# Patient Record
Sex: Female | Born: 1955 | Race: White | Hispanic: No | Marital: Married | Smoking: Never smoker
Health system: Southern US, Community
[De-identification: ages and names within clinical notes are randomized; demographics above are authoritative.]

## PROBLEM LIST (undated history)

## (undated) DIAGNOSIS — I10 Essential (primary) hypertension: Secondary | ICD-10-CM

## (undated) DIAGNOSIS — E785 Hyperlipidemia, unspecified: Secondary | ICD-10-CM

## (undated) DIAGNOSIS — I639 Cerebral infarction, unspecified: Secondary | ICD-10-CM

## (undated) DIAGNOSIS — N390 Urinary tract infection, site not specified: Secondary | ICD-10-CM

## (undated) DIAGNOSIS — E119 Type 2 diabetes mellitus without complications: Secondary | ICD-10-CM

## (undated) HISTORY — PX: BACK SURGERY: SHX140

## (undated) HISTORY — DX: Hyperlipidemia, unspecified: E78.5

## (undated) HISTORY — PX: BREAST SURGERY: SHX581

## (undated) HISTORY — DX: Essential (primary) hypertension: I10

## (undated) HISTORY — DX: Type 2 diabetes mellitus without complications: E11.9

---

## 1998-04-02 ENCOUNTER — Emergency Department (HOSPITAL_COMMUNITY): Admission: EM | Admit: 1998-04-02 | Discharge: 1998-04-02 | Payer: Self-pay

## 2000-01-24 ENCOUNTER — Other Ambulatory Visit: Admission: RE | Admit: 2000-01-24 | Discharge: 2000-01-24 | Payer: Self-pay | Admitting: Gynecology

## 2000-03-16 ENCOUNTER — Encounter: Admission: RE | Admit: 2000-03-16 | Discharge: 2000-03-16 | Payer: Self-pay | Admitting: Gynecology

## 2000-03-16 ENCOUNTER — Encounter: Payer: Self-pay | Admitting: Gynecology

## 2001-12-12 ENCOUNTER — Other Ambulatory Visit: Admission: RE | Admit: 2001-12-12 | Discharge: 2001-12-12 | Payer: Self-pay | Admitting: Gynecology

## 2001-12-19 ENCOUNTER — Inpatient Hospital Stay (HOSPITAL_COMMUNITY): Admission: RE | Admit: 2001-12-19 | Discharge: 2001-12-21 | Payer: Self-pay | Admitting: Gynecology

## 2001-12-19 ENCOUNTER — Encounter (INDEPENDENT_AMBULATORY_CARE_PROVIDER_SITE_OTHER): Payer: Self-pay | Admitting: Specialist

## 2003-06-09 ENCOUNTER — Other Ambulatory Visit: Admission: RE | Admit: 2003-06-09 | Discharge: 2003-06-09 | Payer: Self-pay | Admitting: Gynecology

## 2003-06-19 ENCOUNTER — Encounter: Admission: RE | Admit: 2003-06-19 | Discharge: 2003-06-19 | Payer: Self-pay | Admitting: Gynecology

## 2005-09-03 ENCOUNTER — Emergency Department (HOSPITAL_COMMUNITY): Admission: EM | Admit: 2005-09-03 | Discharge: 2005-09-03 | Payer: Self-pay | Admitting: Emergency Medicine

## 2008-02-28 ENCOUNTER — Encounter: Admission: RE | Admit: 2008-02-28 | Discharge: 2008-02-28 | Payer: Self-pay | Admitting: Gynecology

## 2010-10-29 NOTE — H&P (Signed)
[facility]  Patient:    Shelly Mcdaniel, PARE Visit Number: [id number] MRN: [id number]          Service Type: GYN Location: 4W 0467 01 Attending Physician:  Katrina Stack Dictated by:   Gretta Cool, M.D. Admit Date:  12/19/2001 Discharge Date: 12/21/2001   CC:         Hal T. Stoneking, M.D.   History and Physical  CHIEF COMPLAINT: 1. Abnormal uterine bleeding. 2. Pelvic pain.  HISTORY OF PRESENT ILLNESS:  The patient presents after not having been seen here since 2001.  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  She is now having very irregular menses with bleeding lasting as long as five weeks at a time.  She has skips of a few days and then starts bleeding again.  She has noted some hot flushes but denies significant night sweats.  FSH has been measured at low normal.  She also complains of pelvic pain that is cyclic, occurring when her cycles are regular, and identifiable from mid cycle to just prior to menses, and gone entirely the week after menses.  On ultrasound examination she has evidence of endometrial polyp versus fibroid in the cavity with thickening of the cavity.  She has what appears to be multiple leiomyomata versus adenomyosis.  On clinical exam her uterus feels much bigger than it looks.  Suspect she may have bowel and/or ovarian adhesions.  PAST MEDICAL HISTORY: 1. Usual childhood diseases without sequelae. 2. Hypertension, under the primary care of Dr. Pete Glatter, on Accuretic    10/12.5, doing well on that dose. 3. History of acute nephritis in 1963. 4. Hospitalized for an episode of gastroenteritis in 1994.  PAST SURGICAL HISTORY:  Breast biopsy right breast for solid mass, unknown date.  OBSTETRICAL HISTORY:  Vaginal delivery x 2 in 1991 and 1988.  ALLERGIES:  None known.  PRESENT MEDICATIONS:  Accuretic 10/12.5.  FAMILY HISTORY:  Mother had MI and dementia, died age 39.  Father had MI and diabetes type  2.  Mother also had breast cancer.  No other known familial tendency to disease.  SOCIAL HISTORY:  Denies ethanol or tobacco use.  Denies recreational drugs. The patient is very religious, heavily involved in her church.  Her husband works for [employer] as a Adult nurse.  She is a Warden/ranger.  Two children in the home.  REVIEW OF SYSTEMS:  Denies symptoms.  CARDIORESPIRATORY:  Denies asthma, ______ or shortness of breath.  GASTROINTESTINAL/GENITOURINARY:  Denies frequency or urgency, dysuria, change in bowel habits, food intolerance.  PHYSICAL EXAMINATION:  GENERAL:  Well-developed, well-nourished white female with fine facial hair. No mature dark hairs.  She is moderately over her ideal weight.  HEENT:  Pupils equal and reactive to light and accommodation.  Fundi not examined.  Oropharynx clear.  NECK:  Supple.  Without masses or enlargement.  BREASTS:  Without mass, nodes, or nipple discharge.  Previous biopsy site right breast well healed.  ______ areas are negative.  CHEST:  Clear to P&A.  HEART:  Regular rhythm, without murmur or cardiac enlargement.  ABDOMEN:  Soft.  Scaphoid.  Without mass or organomegaly.  PELVIC:  External genitalia normal female.  Vagina clean, rugose.  Cervix is parous, clean.  Uterus anterior, approximately 12 weeks size, irregular. Difficult to separate the adnexa from the uterus.  Nodularity suggests either adhesion to the posterior aspect of the uterus or leiomyomata, not confirmed by ultrasound.  Adnexa difficult to separate from  the uterus.  RECTOVAGINAL:  Confirms.  EXTREMITIES:  Negative.  NEUROLOGIC:  Physiologic.  IMPRESSION: 1. Abnormal uterine bleeding with ultrasound documentation of endometrial    polyp versus fibroid intruding in the cavity. 2. Cyclic pelvic pain, highly suspicious of endometriosis, with strong family    history of endometriosis. 3. Strong family history of cardiovascular  disease and diabetes and dementia.  RECOMMENDATIONS:  Exploratory laparotomy, probable total abdominal hysterectomy, bilateral salpingo-oophorectomy.  Of note, the patients choice is bilateral salpingo-oophorectomy.  I have discussed the risk/benefit ratio, alternative therapies, all options, pain management, recovery, etc.  She is now admitted for definitive therapy. Dictated by:   Gretta Cool, M.D. Attending Physician:  Katrina Stack DD:  12/26/01 TD:  12/28/01 Job: [id number]

## 2010-10-29 NOTE — Discharge Summary (Signed)
NAMEMERRILY, TEGELER                           ACCOUNT NO.:  [id number]   MEDICAL RECORD NO.:  [id number]                   PATIENT TYPE:  INP   LOCATION:  0467                                 FACILITY:  [facility]   PHYSICIAN:  Gretta Cool, M.D.              DATE OF BIRTH:  01-13-1956   DATE OF ADMISSION:  12/19/2001  DATE OF DISCHARGE:  12/21/2001                                 DISCHARGE SUMMARY   HISTORY OF PRESENT ILLNESS:  The patient is a 55 year old, white married  female, G2, P2 who has been a patient in the office for a long time.  Her  last exam was in 2001.  She had known uterine leiomyomata at that time and  is now having irregular periods with bleeding lasting as long as five weeks.  She is only free of menstrual flow for several days and then starts bleeding  again.  She has had some hot flashes, but denies night sweats.  FSH measures  low normal.  She complains of pelvic pain with cycle when her periods are  regular from mid cycle to just prior to menses and entirely gone after the  menstrual cycle is completed.  Ultrasound evaluation reveals endometrial  polyp versus fibroid in the cavity with thickening of the lining.  She has  what appears to be multiple leiomyomata versus adenomyosis.  On clinical  exam, the uterus felt bigger than it looked.  We suspect she perhaps has  some bowel and/or ovarian adhesions.   PHYSICAL EXAMINATION:  CHEST:  Clear to A&P.  HEART:  Regular rate and rhythm without murmur, gallop or cardiac  enlargement.  ABDOMEN:  Soft and scaphoid without masses or organomegaly.  PELVIC:  External genitalia within normal limits for female.  Vagina rugous.  Cervix is clean.  Uterus is anterior approximately 12 weeks in size and  regular in shape.  It is difficult to separate the adnexa from the uterus.  Nodularity suggesting adhesions to the posterior aspect with early  leiomyomata.  Adnexa difficult to evaluate.   IMPRESSION:  1. Abnormal  uterine bleeding with ultrasound documentation of endometrial     polyp versus fibroid intruding the cavity.  2. Cyclic pelvic pain, highly suspicious for endometriosis with strong     family history.  3. Strong family history of cardiovascular disease, diabetes and dementia.   RECOMMENDATIONS:  1. Exploratory laparotomy, probable total abdominal hysterectomy, bilateral     salpingo-oophorectomy.  2. It is noted that the patient's choice is bilateral salpingo-oophorectomy.     Risks and benefits of this procedure as well as options were discussed     with the patient.  She is now admitted for definitive therapy.   LABORATORY DATA AND X-RAY FINDINGS:  Admission hemoglobin 13.4, hematocrit  38.9.  On postop day #1, hemoglobin 11.8, hematocrit 36.2.   HOSPITAL COURSE:  The patient underwent total abdominal hysterectomy,  bilateral  salpingo-oophorectomy, lysis of severe adhesions of the ovaries,  rectosigmoid from the posterior aspect of the uterus under general  anesthesia.  The procedures were completed without any complications and the  patient was returned to the recovery room in excellent condition.  Pathology  revealed slight cervicitis with proliferative endometrium and benign  endometrial polyp.  No hyperplasia or malignancy identified.  Benign  hemorrhagic corpus luteum and endosalpingosis with no endometriosis or  malignancy identified.  Right and left fallopian tubes with benign care of  cysts, no evidence of malignancy in colon or sigmoid, however, endometriosis  was present.   Her postoperative course was without complications and she was discharged on  postop day #2 in excellent condition.   ACTIVITY:  No heavy lifting or straining.  No vaginal entrance.  Increase  ambulation as tolerated.   SPECIAL INSTRUCTIONS:  She is to call for any fever over 100.1 or failure of  daily improvement.   DIET:  Regular.   DISCHARGE MEDICATIONS:  1. Tylox one p.o. q.4h. p.r.n.  discomfort.  2. Vioxx 25 mg daily.   FOLLOW UP:  She is to return to our office in one week for followup.   CONDITION ON DISCHARGE:  Excellent.   DISCHARGE DIAGNOSES:  1. Endometrial polyp.  2. 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  3. Severe ovarian, uterine and rectosigmoid adhesions.  4. Abnormal uterine bleeding with polyp versus fibroid.  5. Cyclic pelvic pain.  6. Uterine enlargement.   PROCEDURES:  Total abdominal hysterectomy, bilateral salpingo-oophorectomy,  lysis of severe adhesions of the ovaries and rectosigmoid from the posterior  aspect of the uterus.      Matt Holmes, N.P.                          Gretta Cool, M.D.    EMK/MEDQ  D:  01/14/2002  T:  01/19/2002  Job:  [id number]   cc:   Hal T. Stoneking, M.D.

## 2010-10-29 NOTE — Op Note (Signed)
[facility]  Patient:    Shelly Mcdaniel, Shelly Mcdaniel Visit Number: [id number] MRN: [id number]          Service Type: GYN Location: 4W 0467 01 Attending Physician:  Katrina Stack Dictated by:   Gretta Cool, M.D. Proc. Date: 12/19/01 Admit Date:  12/19/2001   CC:         Hal T. Stoneking, M.D.   Operative Report  PREOPERATIVE DIAGNOSES: 1. Abnormal uterine bleeding with luminal polyp versus fibroid. 2. Cyclic pelvic pain, adenomyosis versus endometriosis. 3. Uterine enlargement.  POSTOPERATIVE DIAGNOSIS:  Endometrial polyp, probable adenomyosis with extensive endometriosis and severe ovarian, uterine, and rectosigmoid adhesions.  SURGEON:  Gretta Cool, M.D.  ASSISTANT:  Raynald Kemp, M.D.  PROCEDURE:  Total abdominal hysterectomy, bilateral salpingo-oophorectomy, lysis of severe adhesions of ovaries, rectosigmoid from the posterior aspect of the uterus.  ANESTHESIA:  General orotracheal.  DESCRIPTION OF PROCEDURE:  Under excellent general anesthesia with the patients abdomen prepped and draped as a sterile field and a Foley catheter draining the bladder, a Pfannenstiel incision was made and the excision extended through the fascia.  The peritoneum was then opened and the abdomen explored.  The gallbladder was examined carefully, and I did not detect any evidence of sludge or stones in her gallbladder.  It was quite flaccid, without evidence of granules in it.  The remainder of the upper abdominal examination was unremarkable.  The appendix was not removed.  Examination of the pelvis revealed extensive endometriosis with severe dense adhesions of the uterus to the rectosigmoid, and both ovaries and tubes were densely adherent to the posterior aspect of the uterus.  These adhesions were lysed by blunt and sharp dissection until the uterus was finally freed from the tubes and ovaries and from the rectosigmoid.  The dissection was carried all  the way to the rectovaginal septum, largely by sharp dissection to prevent inadvertent injury to the colon.  There was no evidence of residual ovarian tissue or significant endometriosis left on the colon.  At this point the uterus was elevated up out of the pelvis.  The bleeding from all the adhesions was controlled by monopolar cautery.  The round ligament was then transected on each side anteriorly.  The anterior leaf of the broad ligament was then opened and the bladder pushed off the lower segment.  The infundibulopelvic vessels were then skeletonized, isolated from the ureter, clamped, cut, sutured, and tied with 0-Vicryl, and a second free tie was used to doubly ligate the infundibulopelvic.  The uterine vessels were then skeletonized, clamped, cut, sutured, and tied with 0-Vicryl.  The cardinal and uterosacral ligaments were then likewise clamped, cut, sutured, and tied with 0-Vicryl.  At this point the cervix was excised and the cuff closed with a running suture of 0-Vicryl. A suture of 0-Ethibond was then used to provide cardinal uterosacral colposuspension so as to prevent vaginal cuff descensus.  The pedicles were then all examined carefully.  There was no bleeding.  The pelvic floor was then reperitonealized with running suture of 2-0 Monocryl.  At this point the packs and retractors were removed.  The pelvis was irrigated with lactated Ringers.  The abdominal peritoneum was then closed with running suture of 0-Monocryl from the apex to the symphysis.  The rectus muscles were then plicated in the midline with a running suture of 0-Monocryl.  The fascia was then approximated from each angle to the midline with a running suture of 0-Vicryl.  The subcutaneous was approximated  ______ with a 3-0 Vicryl, and the skin was closed with skin staples and Steri-Strips.  At the end of the procedure sponge and lap counts were correct, with no complications.  The patient returned to the  recovery room in excellent condition. Dictated by:   Gretta Cool, M.D. Attending Physician:  Katrina Stack DD:  12/19/01 TD:  12/22/01 Job: [id number]

## 2010-11-03 ENCOUNTER — Other Ambulatory Visit: Payer: Self-pay | Admitting: Geriatric Medicine

## 2010-11-03 DIAGNOSIS — K829 Disease of gallbladder, unspecified: Secondary | ICD-10-CM

## 2010-11-03 DIAGNOSIS — R112 Nausea with vomiting, unspecified: Secondary | ICD-10-CM

## 2010-11-04 ENCOUNTER — Ambulatory Visit
Admission: RE | Admit: 2010-11-04 | Discharge: 2010-11-04 | Disposition: A | Discharge status: Home / Self Care | Payer: BC Managed Care – PPO | Source: Ambulatory Visit | Attending: Geriatric Medicine | Admitting: Geriatric Medicine

## 2010-11-04 DIAGNOSIS — K829 Disease of gallbladder, unspecified: Secondary | ICD-10-CM

## 2010-11-04 DIAGNOSIS — R112 Nausea with vomiting, unspecified: Secondary | ICD-10-CM

## 2010-12-31 ENCOUNTER — Other Ambulatory Visit (HOSPITAL_COMMUNITY): Payer: Worker's Compensation

## 2011-01-03 ENCOUNTER — Encounter (HOSPITAL_COMMUNITY)
Admission: RE | Admit: 2011-01-03 | Discharge: 2011-01-03 | Disposition: A | Discharge status: Home / Self Care | Payer: Worker's Compensation | Source: Ambulatory Visit | Attending: Neurological Surgery | Admitting: Neurological Surgery

## 2011-01-03 ENCOUNTER — Other Ambulatory Visit (HOSPITAL_COMMUNITY): Payer: Self-pay | Admitting: Neurological Surgery

## 2011-01-03 ENCOUNTER — Ambulatory Visit (HOSPITAL_COMMUNITY)
Admission: RE | Admit: 2011-01-03 | Discharge: 2011-01-03 | Disposition: A | Discharge status: Home / Self Care | Payer: Worker's Compensation | Source: Ambulatory Visit | Attending: Neurological Surgery | Admitting: Neurological Surgery

## 2011-01-03 DIAGNOSIS — Z01812 Encounter for preprocedural laboratory examination: Secondary | ICD-10-CM | POA: Insufficient documentation

## 2011-01-03 DIAGNOSIS — M4722 Other spondylosis with radiculopathy, cervical region: Secondary | ICD-10-CM

## 2011-01-03 DIAGNOSIS — Z01818 Encounter for other preprocedural examination: Secondary | ICD-10-CM | POA: Insufficient documentation

## 2011-01-03 DIAGNOSIS — M47812 Spondylosis without myelopathy or radiculopathy, cervical region: Secondary | ICD-10-CM | POA: Insufficient documentation

## 2011-01-03 LAB — CBC
MCH: 31.4 pg (ref 26.0–34.0)
MCHC: 36.7 g/dL — ABNORMAL HIGH (ref 30.0–36.0)
Platelets: 227 10*3/uL (ref 150–400)
RDW: 12.6 % (ref 11.5–15.5)

## 2011-01-03 LAB — SURGICAL PCR SCREEN: Staphylococcus aureus: NEGATIVE

## 2011-01-03 LAB — BASIC METABOLIC PANEL
Calcium: 9.7 mg/dL (ref 8.4–10.5)
GFR calc Af Amer: >60 mL/min (ref >60)
GFR calc non Af Amer: >60 mL/min (ref >60)
Potassium: 3.7 mEq/L (ref 3.5–5.1)
Sodium: 138 mEq/L (ref 135–145)

## 2011-01-06 ENCOUNTER — Inpatient Hospital Stay (HOSPITAL_COMMUNITY)
Admission: RE | Admit: 2011-01-06 | Discharge: 2011-01-11 | DRG: 471 | Disposition: A | Discharge status: Home / Self Care | Payer: Worker's Compensation | Source: Ambulatory Visit | Attending: Neurological Surgery | Admitting: Neurological Surgery

## 2011-01-06 DIAGNOSIS — I10 Essential (primary) hypertension: Secondary | ICD-10-CM | POA: Diagnosis present

## 2011-01-06 DIAGNOSIS — J384 Edema of larynx: Secondary | ICD-10-CM | POA: Diagnosis not present

## 2011-01-06 DIAGNOSIS — E1101 Type 2 diabetes mellitus with hyperosmolarity with coma: Secondary | ICD-10-CM | POA: Diagnosis present

## 2011-01-06 DIAGNOSIS — Z794 Long term (current) use of insulin: Secondary | ICD-10-CM

## 2011-01-06 DIAGNOSIS — F411 Generalized anxiety disorder: Secondary | ICD-10-CM | POA: Diagnosis present

## 2011-01-06 DIAGNOSIS — M47812 Spondylosis without myelopathy or radiculopathy, cervical region: Principal | ICD-10-CM | POA: Diagnosis present

## 2011-01-06 DIAGNOSIS — E860 Dehydration: Secondary | ICD-10-CM | POA: Diagnosis present

## 2011-01-06 LAB — GLUCOSE, CAPILLARY
Glucose-Capillary: 374 mg/dL — ABNORMAL HIGH (ref 70–99)
Glucose-Capillary: 302 mg/dL — ABNORMAL HIGH (ref 70–99)
Glucose-Capillary: 268 mg/dL — ABNORMAL HIGH (ref 70–99)
Glucose-Capillary: 159 mg/dL — ABNORMAL HIGH (ref 70–99)

## 2011-01-06 LAB — HEMOGLOBIN A1C: Mean Plasma Glucose: 266 mg/dL — ABNORMAL HIGH (ref <117)

## 2011-01-06 NOTE — Consult Note (Signed)
Shelly Mcdaniel, TREICHLER NO.:  [id number]  MEDICAL RECORD NO.:  [id number]  LOCATION:  2899                         FACILITY:  [facility]  PHYSICIAN:  Talmage Nap, MD  DATE OF BIRTH:  [birth year]  DATE OF CONSULTATION: 01/06/2011 DATE OF DISCHARGE:                                CONSULTATION   REFERRING PHYSICIAN:  Stefani Dama, MD  REASON FOR CONSULTATION:  Management of uncontrolled blood sugar level.  HISTORY OF PRESENT ILLNESS:  The patient is a 55 year old Caucasian female with history of hypertension and diabetes mellitus, who was admitted by the neurosurgeon, Dr. Barnett Abu for cervical spondylitis and scheduled for anterior cervical decompression/diskectomy surgery. Surgery was, however, postponed because the patient was found to have markedly elevated blood sugar level.  Her initial blood sugar level was said to be over 500 mg percent.  According to the patient for the past 3 days, she has been having generalized abdominal discomfort with associated nausea and vomiting.  She denied any history of diarrhea. She denied any fever.  She denied any chills.  She denied any rigor. She claims she had multiple episodes of vomiting that was non-projectile and at the same time she had very poor appetite.  The patient said she took a bottle of soda prior to coming to the hospital.  She, however, denied any history of chest pain or shortness of breath.  She denied any polyuria or polydipsia.  She denied any urinary symptoms and subsequently presented to the hospital for her surgery.  An immediate blood sugar level done showed blood sugar level to be more than 500 mg percent.  Surgery was, however, postponed and I was consulted to evaluate the patient and to control the blood sugar level.  PAST MEDICAL HISTORY:  Positive for, 1. Diabetes mellitus. 2. Hypertension. 3. Questionable anxiety state.  PAST SURGICAL HISTORY:  Lumpectomy and total abdominal  hysterectomy.  MEDICATIONS:  Her preadmission meds include, 1. Aspirin 81 mg p.o. daily. 2. Fish oil 1000 mg over-the-counter one capsule p.o. daily. 3. Vitamin D3 1000 units over-the-counter one p.o. daily at bedtime. 4. Sertraline 100 mg half a tablet p.o. daily. 5. Quinapril/hydrochlorothiazide 20/25 one p.o. daily at bedtime. 6. Metformin 1000 mg one p.o. daily. 7. Glipizide 5 mg p.o. b.i.d. 8. Phenergan 25 mg p.o. daily.  ALLERGIES:  TRIMETHOPRIM/SULFAMETHOXAZOLE.  SOCIAL HISTORY:  Negative for alcohol or tobacco use.  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  FAMILY HISTORY:  York Spaniel to be positive for diabetes mellitus.  REVIEW OF SYSTEMS:  The patient denies any history of headaches.  No blurry vision.  Complaint of excessive thirst.  Denies any chest pain or shortness of breath.  No cough.  No PND or orthopnea.  Presently denies any abdominal discomfort.  No diarrhea or hematochezia.  No dysuria or hematuria.  No swelling of the lower extremities.  No intolerance to heat or cold and no neuropsychiatric disorder.  PHYSICAL EXAMINATION:  GENERAL:  Very pleasant middle-aged lady, dehydrated, but not in any respiratory distress at present. VITAL SIGNS:  Blood pressure is 115/78, heart rate is 88, respiratory rate is 10, saturating 95% on room air. HEENT:  Mild  pallor.  Pupils are reactive to light and extraocular muscles are intact. NECK:  No jugular venous distention.  No carotid bruits.  No lymphadenopathy. CHEST:  Clear to auscultation. HEART:  Sounds are 1 and 2. ABDOMEN:  Soft and nontender.  Liver, spleen, and kidney not palpable. Bowel sounds are positive. EXTREMITIES:  No pedal edema. NEUROLOGIC:  Nonfocal. MUSCULOSKELETAL SYSTEM:  Unremarkable. NEUROPSYCHIATRIC:  Unremarkable. SKIN:  Showed decreased turgor.  LABORATORY DATA:  Basic metabolic panel done by the patient on January 03, 2011, shows sodium of 138, potassium of 3.7, chloride of 90 with  a bicarb of 28, glucose is 231.  BUN is 19, creatinine 0.62.  Complete blood count with no differential showed WBC of 7.6, hemoglobin of 15.3, hematocrit of 41.7, MCV of 85.5 with a platelet count of 227,000.  IMPRESSION: 1. Cervical spondylitis, scheduled for anterior cervical     decompression/diskectomy surgery on 01/07/2011 2. Nonketotic hyperosmolar hyperglycemia. 3. Dehydration. 4. Hypertension. 5. Anxiety state.  PLAN:  To admit the patient to 3000 units as per the neurosurgeon.  The patient is being vigorously rehydrated with normal saline IV to go at rate of 250 mL an hour.  She will be on Accu-Cheks q. Hourly.  When blood sugar level is lesser equal to 250 mg percent, the patient will be given Lantus insulin 10 units subcu stat and she will be on Accu-Cheks t.i.d. with a.c. and h.s. with regular insulin sliding scale (moderate scale).  The patient will also be given Lantus 10 units subcu at bedtime and she will be on Zofran 4 mg IV q.4 p.r.n. for nausea.  She will start diabetic ADA diet.  She will also be on sertraline 50 mg p.o. daily. Since the patient is scheduled to have surgery in the morning, she will be made n.p.o. at midnight.  Further workup to be done on this patient will include hemoglobin A1c, CBC, CMP and magnesium will be repeated in a.m.  The patient will be followed and evaluated on a daily basis.  Thank you for allowing me to participate in the management of this patient.     Talmage Nap, MD     CN/MEDQ  D:  01/06/2011  T:  01/06/2011  Job:  [id number]  Electronically Signed by Talmage Nap  on 01/06/2011 05:51:04 PM

## 2011-01-07 ENCOUNTER — Inpatient Hospital Stay (HOSPITAL_COMMUNITY)
Admission: RE | Admit: 2011-01-07 | Payer: Worker's Compensation | Source: Ambulatory Visit | Admitting: Neurological Surgery

## 2011-01-07 ENCOUNTER — Inpatient Hospital Stay (HOSPITAL_COMMUNITY): Payer: Worker's Compensation

## 2011-01-07 LAB — DIFFERENTIAL
Eosinophils Absolute: 0.1 10*3/uL (ref 0.0–0.7)
Eosinophils Relative: 1 % (ref 0–5)
Lymphocytes Relative: 45 % (ref 12–46)
Lymphs Abs: 2.2 10*3/uL (ref 0.7–4.0)
Monocytes Relative: 5 % (ref 3–12)
Neutrophils Relative %: 48 % (ref 43–77)

## 2011-01-07 LAB — GLUCOSE, CAPILLARY
Glucose-Capillary: 221 mg/dL — ABNORMAL HIGH (ref 70–99)
Glucose-Capillary: 214 mg/dL — ABNORMAL HIGH (ref 70–99)
Glucose-Capillary: 247 mg/dL — ABNORMAL HIGH (ref 70–99)

## 2011-01-07 LAB — COMPREHENSIVE METABOLIC PANEL
BUN: 15 mg/dL (ref 6–23)
CO2: 28 mEq/L (ref 19–32)
Calcium: 8.6 mg/dL (ref 8.4–10.5)
Chloride: 107 mEq/L (ref 96–112)
Creatinine, Ser: 0.52 mg/dL (ref 0.50–1.10)
GFR calc Af Amer: >60 mL/min (ref >60)
GFR calc non Af Amer: >60 mL/min (ref >60)
Total Bilirubin: 0.3 mg/dL (ref 0.3–1.2)

## 2011-01-07 LAB — CBC
HCT: 34.5 % — ABNORMAL LOW (ref 36.0–46.0)
MCH: 29.6 pg (ref 26.0–34.0)
MCV: 84.4 fL (ref 78.0–100.0)
RBC: 4.09 MIL/uL (ref 3.87–5.11)
WBC: 4.9 10*3/uL (ref 4.0–10.5)

## 2011-01-07 LAB — MAGNESIUM: Magnesium: 1.7 mg/dL (ref 1.5–2.5)

## 2011-01-08 LAB — GLUCOSE, CAPILLARY
Glucose-Capillary: 264 mg/dL — ABNORMAL HIGH (ref 70–99)
Glucose-Capillary: 143 mg/dL — ABNORMAL HIGH (ref 70–99)

## 2011-01-08 LAB — CBC
Hemoglobin: 12.6 g/dL (ref 12.0–15.0)
MCHC: 35.8 g/dL (ref 30.0–36.0)
WBC: 8.4 10*3/uL (ref 4.0–10.5)

## 2011-01-08 LAB — COMPREHENSIVE METABOLIC PANEL
Albumin: 3.2 g/dL — ABNORMAL LOW (ref 3.5–5.2)
Alkaline Phosphatase: 80 U/L (ref 39–117)
BUN: 13 mg/dL (ref 6–23)
Calcium: 9.2 mg/dL (ref 8.4–10.5)
Creatinine, Ser: 0.58 mg/dL (ref 0.50–1.10)
GFR calc Af Amer: >60 mL/min (ref >60)
Glucose, Bld: 386 mg/dL — ABNORMAL HIGH (ref 70–99)
Potassium: 4.3 mEq/L (ref 3.5–5.1)
Total Protein: 6.4 g/dL (ref 6.0–8.3)

## 2011-01-08 LAB — DIFFERENTIAL
Basophils Absolute: 0 10*3/uL (ref 0.0–0.1)
Basophils Relative: 0 % (ref 0–1)
Lymphocytes Relative: 8 % — ABNORMAL LOW (ref 12–46)
Monocytes Absolute: 0.3 10*3/uL (ref 0.1–1.0)
Neutro Abs: 7.4 10*3/uL (ref 1.7–7.7)
Neutrophils Relative %: 88 % — ABNORMAL HIGH (ref 43–77)

## 2011-01-08 LAB — MAGNESIUM: Magnesium: 2 mg/dL (ref 1.5–2.5)

## 2011-01-09 ENCOUNTER — Inpatient Hospital Stay (HOSPITAL_COMMUNITY): Payer: Worker's Compensation

## 2011-01-09 LAB — GLUCOSE, CAPILLARY
Glucose-Capillary: 277 mg/dL — ABNORMAL HIGH (ref 70–99)
Glucose-Capillary: 283 mg/dL — ABNORMAL HIGH (ref 70–99)
Glucose-Capillary: 315 mg/dL — ABNORMAL HIGH (ref 70–99)

## 2011-01-10 LAB — GLUCOSE, CAPILLARY
Glucose-Capillary: 276 mg/dL — ABNORMAL HIGH (ref 70–99)
Glucose-Capillary: 258 mg/dL — ABNORMAL HIGH (ref 70–99)
Glucose-Capillary: 256 mg/dL — ABNORMAL HIGH (ref 70–99)
Glucose-Capillary: 248 mg/dL — ABNORMAL HIGH (ref 70–99)

## 2011-01-11 LAB — POCT I-STAT GLUCOSE: Glucose, Bld: 133 mg/dL — ABNORMAL HIGH (ref 70–99)

## 2011-01-11 LAB — GLUCOSE, CAPILLARY
Glucose-Capillary: 177 mg/dL — ABNORMAL HIGH (ref 70–99)
Glucose-Capillary: 213 mg/dL — ABNORMAL HIGH (ref 70–99)

## 2011-01-12 LAB — GLUCOSE, CAPILLARY

## 2011-01-17 NOTE — Discharge Summary (Signed)
  [name], [name] NO.:  [id number]  MEDICAL RECORD NO.:  [id number]  LOCATION:  [place]                         FACILITY:  [facility]  PHYSICIAN:  Stefani Dama, M.D.  DATE OF BIRTH:  08-21-1955  DATE OF ADMISSION:  01/06/2011 DATE OF DISCHARGE:  01/11/2011                              DISCHARGE SUMMARY   ADMITTING DIAGNOSES: 1. Cervical spondylosis with radiculopathy and myelopathy. 2. Diabetes mellitus with poor diabetic control. 3. Postoperative laryngeal edema, requiring steroid medication. 4. Exacerbation of diabetes secondary to steroids.  FINAL DIAGNOSES: 1. Cervical spondylosis with radiculopathy and myelopathy. 2. Diabetes mellitus with poor diabetic control. 3. Postoperative laryngeal edema, requiring steroid medication. 4. Exacerbation of diabetes secondary to steroids.  CONDITION ON DISCHARGE:  Improving.  HOSPITAL COURSE:  Shelly Mcdaniel is a 55 year old individual who was electively worked up for cervical spondylitic disease and noting that she had some early myelopathic cord compression and had some symptoms of myelopathy in addition to radiculopathy.  She was advised regarding the need for 3-level anterior decompression arthrodesis.  This was planned for the 26th, however, when the patient presented to the outpatient admitting area, her blood sugar was noted be 514.  Her surgery was cancelled for that day and the patient was admitted to undergo some control of her diabetic condition.  She was given some exogenous insulin, and by the following day her sugars were in the 130-150 range, and she underwent a surgical decompression as planned.  First evening postoperatively the patient was stable.  However, the following day, that is the 28th, the patient developed some significant laryngeal edema.  Her voice was very hoarse.  She had some swelling in the soft tissues of her neck, but no evidence of gross hematoma.  She was started on some high-dose  Decadron which promptly improved her condition; however, soon her sugars started to rise.  She was seen by the Hospitalist in consultation for diabetic control and again started on exogenous insulin.  Her steroids were rapidly tapered once her edema picture improved and at the current time she is being discharged home on Decadron 2 mg one twice a day for 1 day to be tapered to 1 mg a day for the next several days.  She has also been instructed as to the use of insulin for the next several days until her sugars come under control and she can continue on her metformin and other oral agents.  At the time of discharge, she is given a prescription for Percocet #40 without refills, Valium 5 mg #30 without refills.  She will be continued on her glipizide and metformin which she was on as an outpatient.  She has been instructed to use of Humulin N and will continue this until her blood sugars do not require further stabilization.  She will be seen in the office in approximately 3 weeks' time and her followup will be with the primary care physician regarding care for diabetes.     Stefani Dama, M.D.     [name]  D:  01/11/2011  T:  01/12/2011  Job:  [id number]  Electronically Signed by Barnett Abu M.D. on 01/17/2011 07:35:36 AM

## 2011-01-17 NOTE — Op Note (Signed)
NAMESERAYAH, YAZDANI NO.:  [id number]  MEDICAL RECORD NO.:  [id number]  LOCATION:  [place]                         FACILITY:  [facility]  PHYSICIAN:  Stefani Dama, M.D.  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  PROCEDURE:  Anterior cervical decompression at C4-5, C5-6, and C6-7, arthrodesis with structural allograft and Alphatec plate fixation at C4- C7.  SURGEON:  Stefani Dama, MD  FIRST ASSISTANT:  Coletta Memos, MD  ANESTHESIA:  General endotracheal intubation.  Aleana is a 55 year old individual who has had significant problems with neck, shoulder and arm pain for a prolonged period of time refractory to conservative measures.  She has advanced spondylitic disease at C4-5, C5- 6 and C6-7 with bilateral foraminal stenoses at the most severe at C5-6, moderately severe at C4-5 and moderately severe at C6-7.  She also had some mild spondylitic changes at C3-4 and it was felt that relief of the worst of her radicular symptoms should need decompression at the three levels noted.  PROCEDURE:  The patient was brought to the operating room supine on the stretcher.  After smooth induction of general endotracheal anesthesia, she was placed in 5 pins of halter traction.  Neck was prepped with alcohol and DuraPrep and draped in a sterile fashion.  Transverse incision was made in the left side of the neck and this was carried down through the platysma.  Plane between the sternocleidomastoid and strap muscles were dissected bluntly until the prevertebral space was reached. The first identifiable disk space was noted to be that of C4-5 on localizing radiograph and dissection was nicely centered over the  area of C5-C6.  Longus coli muscle was then stripped off of either side of midline and self-retaining retractor was placed under the muscle.  The first disk space that was exposed was C5-C6.  A 15-blade was used to open the anterior longitudinal ligament.  There was moderate amount of the ventral osteophytosis.  This was taken down with 2-mm Kerrison punch and the disk space was entered using combination of Kerrison rongeurs to evacuate a significant quantity of severely degenerated and desiccated disk material.  Dissection continued, there was noted to be substantial osteophytic spurs on either side of C5-6.  These were drilled down with high-speed drill.  A 2-mm Kerrison punch was then used to undercut the ligamentum and decompress the lateral aspects near the nerve roots.  As the nerve roots were decompressed, the uncinate spurs were taken down further and the central canal was also decompressed removing the entirety of the posterior longitudinal ligament.  Once was area was well relieved, hemostasis in the soft tissues was obtained with some bipolar cautery and some small pledgets of Gelfoam and a 6-mm transgraft was shaved to the appropriate size and configuration.  Endplates were smoothed with the same barrel bit and then the bone graft was filled with demineralized bone matrix and placed into the  interspace.  Tension was then turned to C4-5 where similar procedure was carried out.  The uncinate spurs were not quite osteophytic; however, there were drilled down fully.  Lateral recesses were cleared and hemostasis was similarly achieved.  Again, a 6-mm transgraft was shaved with appropriate configuration, filled with demineralized bone matrix and placed into the interspace.  It was countersunk appropriately at C6-C7.  Uncinate spurs were moderate in size.  These were drilled down and completely removed. C7 nerve roots were well decompressed.  The posterior longitudinal ligament was  removed completely both centrally and laterally, and hemostasis was achieved in a similar fashion.  Endplates were smoothed as they were in the other two levels and a 6-mm transgraft was placed into the interspace, filled with demineralized bone matrix.  Once the decompression was completed, traction was removed, then the 51-mm standard size Alphatec plate of the newest variety was placed into the interspace and secured with eight locking 4 x 14-mm screws.  The final localizing radiograph identified good position of the construct. Hemostasis in the soft tissues in the paraspinous musculature was then meticulously obtained.  Remainder of the demineralized bone matrix was packed into the lateral gutters to facilitate arthrodesis and once this was accomplished, then the wound was copiously irrigated with antibiotic irrigating fluid several times and when good hemostasis was established, the platysma was closed with 3-0 Vicryl in interrupted fashion, 3-0 Vicryl using subcuticular tissues and Dermabond was placed on the skin. The patient was then returned to recovery room in stable condition. Blood loss was estimated little over 100 mL.     Stefani Dama, M.D.     [name]  D:  01/07/2011  T:  01/08/2011  Job:  [id number]  Electronically Signed by Barnett Abu M.D. on 01/17/2011 07:35:30 AM

## 2011-02-17 NOTE — Discharge Summary (Signed)
  Shelly Mcdaniel, Shelly Mcdaniel NO.:  [id number]  MEDICAL RECORD NO.:  [id number]  LOCATION:  [place]                         FACILITY:  [facility]  PHYSICIAN:  Stefani Dama, M.D.  DATE OF BIRTH:  Dec 13, 1955  DATE OF ADMISSION:  01/06/2011 DATE OF DISCHARGE:  01/11/2011                              DISCHARGE SUMMARY   ADMITTING DIAGNOSES:  Cervical spondylosis and spinal stenosis.  SECONDARY DIAGNOSES:  Diabetes mellitus.  BRIEF HISTORY AND HOSPITAL COURSE:  The patient is a 55 year old female with diabetes and is being presented for elective cervical spinal decompression and fusion.  She came to the hospital and her blood sugar was 513.  Surgery was cancelled and rescheduled for the next day. Hospitalists were consulted for glucose control.  The patient has also had some nausea and vomiting secondary to anxiety.  Hospitalist Team was consulted to establish blood sugar control and surgery is planned for the next day.  She was found to have hyperglycemia, dehydration, hypertension, and anxiety.  All of these conditions were treated by the Hospitalist Team.  She was ready for surgery the next day.  ACDF at C4- 5, C5-6, C6-7 was planned.  Her CBGs were well controlled the next morning on January 07, 2011.  She was n.p.o.  She tolerated the procedure well under general endotracheal anesthesia, stabilized in recovery room, placed back on the floor.  Vital signs were stable.  Started with physical therapy, occupational therapy, continued glycemic control. Recommendation was to follow up with her primary care doctor a week after discharge and she was ready for discharge home on January 11, 2011. Eating well, voiding well, neurologically intact, afebrile.  Wound benign, no erythema, drainage, or signs of infection.  Ambulating safely following neck precautions.  DISCHARGE CONDITION:  Stable, improved.  DISCHARGE INSTRUCTIONS:  Discharged home.  She was discharged on, 1. Enteric  coated aspirin 81 mg p.o. daily. 2. Fish oil 1000 mg p.o. daily. 3. Vitamin D3 1000 units p.o. at bedtime. 4. Sertraline 100 mg one-half tablet p.o. daily. 5. Quinapril/hydrochlorothiazide 20/25 one p.o. at bedtime. 6. Metformin 1000 mg 1 p.o. b.i.d. 7. Phenergan 25 mg 1 tablet p.r.n. nausea q.6 h. 8. Glipizide 5 mg one p.o. b.i.d.  Contact our office prior to followup if she has any question or concerns.  Instructed on wound care.     Shelly Mcdaniel.   ______________________________ Stefani Dama, M.D.    SCI/MEDQ  D:  02/10/2011  T:  02/10/2011  Job:  [id number]  Electronically Signed by Velora Heckler. on 02/15/2011 09:38:30 AM Electronically Signed by Barnett Abu M.D. on 02/17/2011 03:19:52 PM

## 2011-03-01 ENCOUNTER — Other Ambulatory Visit: Payer: Self-pay | Admitting: Gynecology

## 2011-03-01 DIAGNOSIS — Z1231 Encounter for screening mammogram for malignant neoplasm of breast: Secondary | ICD-10-CM

## 2011-03-09 ENCOUNTER — Ambulatory Visit
Admission: RE | Admit: 2011-03-09 | Discharge: 2011-03-09 | Disposition: A | Discharge status: Home / Self Care | Payer: Worker's Compensation | Source: Ambulatory Visit | Attending: Gynecology | Admitting: Gynecology

## 2011-03-09 DIAGNOSIS — Z1231 Encounter for screening mammogram for malignant neoplasm of breast: Secondary | ICD-10-CM

## 2012-11-12 ENCOUNTER — Ambulatory Visit: Payer: Worker's Compensation

## 2012-11-12 ENCOUNTER — Other Ambulatory Visit: Payer: Self-pay | Admitting: Occupational Medicine

## 2012-11-12 DIAGNOSIS — R52 Pain, unspecified: Secondary | ICD-10-CM

## 2013-08-08 ENCOUNTER — Ambulatory Visit: Payer: Self-pay | Admitting: *Deleted

## 2013-08-09 ENCOUNTER — Encounter: Payer: Self-pay | Admitting: Dietician

## 2013-08-09 ENCOUNTER — Encounter: Payer: BC Managed Care – PPO | Attending: Geriatric Medicine | Admitting: Dietician

## 2013-08-09 VITALS — Ht 68.0 in | Wt 198.6 lb

## 2013-08-09 DIAGNOSIS — Z713 Dietary counseling and surveillance: Secondary | ICD-10-CM | POA: Insufficient documentation

## 2013-08-09 DIAGNOSIS — E119 Type 2 diabetes mellitus without complications: Secondary | ICD-10-CM | POA: Insufficient documentation

## 2013-08-09 NOTE — Progress Notes (Signed)
  Medical Nutrition Therapy:  Appt start time: 1430 end time:  1530.  Assessment:  Primary concerns today: Shelly Mcdaniel is here today to get a "re-education on following a diabetes diet".   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 Has had diabetes for about 3 years and had a hx of gestational diabetes. Last Hgb A1c was about 8.0% a few years ago when she had spinal surgery. Was taking steroids at that time.  Planning to get another A1c in few weeks.   Testing blood sugar about 2 x day and getting ~120-140 mg/dl fasting.   Preferred Learning Style:   No preference indicated   Learning Readiness:   Not ready  Contemplating  Ready  Change in progress   MEDICATIONS: see list   DIETARY INTAKE:  Avoided foods include raw carrots    24-hr recall:  B ( AM): cereal such as 1/2 cup rice chex/rice crispies, a banana, and 1 cup skim milk   Snk ( AM): apple or peanut butter crackers  L ( PM): salad with ham and cheese, lettuce, tomatoes and reduced fat dressing with ice drink water Snk ( PM): apple with peanut butter or peanut butter crackers or grapes or other fruit D ( PM): chicken or fish broiled/baked or soup (chili, chicken noodle) or chicken pie with just the top or vegetable plates Snk ( PM): 1/2 banana on bread with mayo or celery/green pepper with onion dip Beverages: water  Usual physical activity: none recently  Estimated energy needs: 1600 calories 180 g carbohydrates 120 g protein 44 g fat  Progress Towards Goal(s):  In progress.   Nutritional Diagnosis:  Kennett-2.1 Inpaired nutrition utilization As related to glucose metabolism.  As evidenced by Hgb A1c of 8.0%.    Intervention:  Nutrition counseling provided.  Goals:  Follow Diabetes Meal Plan as instructed  Eat 3 meals and 2 snacks, every 3-5 hrs  Limit carbohydrate intake to 30-45 grams carbohydrate/meal  Limit  carbohydrate intake to 0-15 grams carbohydrate/snack  Add lean protein foods to meals/snacks  Monitor glucose levels as instructed by your doctor  Aim for 30 mins of physical activity daily  Teaching Method Utilized:  Visual Auditory Hands on  Handouts given during visit include:  Living Well With Diabetes  Yellow Cards  15 g CHO Snacks  Blood Glucose Monitoring  Barriers to learning/adherence to lifestyle change: none  Demonstrated degree of understanding via:  Teach Back   Monitoring/Evaluation:  Dietary intake, exercise, and body weight prn.

## 2013-08-09 NOTE — Patient Instructions (Addendum)
Goals:  Follow Diabetes Meal Plan as instructed  Eat 3 meals and 2 snacks, every 3-5 hrs  Limit carbohydrate intake to 30-45 grams carbohydrate/meal  Limit carbohydrate intake to 0-15 grams carbohydrate/snack  Add lean protein foods to meals/snacks  Monitor glucose levels as instructed by your doctor  Aim for 30 mins of physical activity daily  

## 2013-10-23 ENCOUNTER — Other Ambulatory Visit: Payer: Self-pay | Admitting: Gastroenterology

## 2013-12-02 ENCOUNTER — Encounter (HOSPITAL_COMMUNITY): Payer: Self-pay | Admitting: Pharmacy Technician

## 2013-12-12 ENCOUNTER — Encounter (HOSPITAL_COMMUNITY): Payer: Self-pay | Admitting: *Deleted

## 2013-12-24 ENCOUNTER — Ambulatory Visit (HOSPITAL_COMMUNITY): Payer: BC Managed Care – PPO | Admitting: Anesthesiology

## 2013-12-24 ENCOUNTER — Encounter (HOSPITAL_COMMUNITY)
Admission: RE | Disposition: A | Discharge status: Home / Self Care | Payer: Self-pay | Source: Ambulatory Visit | Attending: Gastroenterology

## 2013-12-24 ENCOUNTER — Ambulatory Visit (HOSPITAL_COMMUNITY)
Admission: RE | Admit: 2013-12-24 | Discharge: 2013-12-24 | Disposition: A | Discharge status: Home / Self Care | Payer: BC Managed Care – PPO | Source: Ambulatory Visit | Attending: Gastroenterology | Admitting: Gastroenterology

## 2013-12-24 ENCOUNTER — Encounter (HOSPITAL_COMMUNITY): Payer: BC Managed Care – PPO | Admitting: Anesthesiology

## 2013-12-24 ENCOUNTER — Encounter (HOSPITAL_COMMUNITY): Payer: Self-pay

## 2013-12-24 DIAGNOSIS — I10 Essential (primary) hypertension: Secondary | ICD-10-CM | POA: Insufficient documentation

## 2013-12-24 DIAGNOSIS — E119 Type 2 diabetes mellitus without complications: Secondary | ICD-10-CM | POA: Insufficient documentation

## 2013-12-24 DIAGNOSIS — Z1211 Encounter for screening for malignant neoplasm of colon: Secondary | ICD-10-CM | POA: Insufficient documentation

## 2013-12-24 DIAGNOSIS — Z79899 Other long term (current) drug therapy: Secondary | ICD-10-CM | POA: Insufficient documentation

## 2013-12-24 HISTORY — PX: COLONOSCOPY WITH PROPOFOL: SHX5780

## 2013-12-24 LAB — GLUCOSE, CAPILLARY: Glucose-Capillary: 301 mg/dL — ABNORMAL HIGH (ref 70–99)

## 2013-12-24 SURGERY — COLONOSCOPY WITH PROPOFOL
Anesthesia: Monitor Anesthesia Care

## 2013-12-24 MED ORDER — PROPOFOL 10 MG/ML IV BOLUS
INTRAVENOUS | Status: DC | PRN
  Administered 2013-12-24 (×3): 20 mg via INTRAVENOUS

## 2013-12-24 MED ORDER — FENTANYL CITRATE 0.05 MG/ML IJ SOLN: 25.0000 ug | INTRAMUSCULAR | Status: DC | PRN

## 2013-12-24 MED ORDER — PROPOFOL 10 MG/ML IV BOLUS
INTRAVENOUS | Status: AC
  Filled 2013-12-24: qty 20

## 2013-12-24 MED ORDER — LIDOCAINE HCL (CARDIAC) 20 MG/ML IV SOLN
INTRAVENOUS | Status: AC
  Filled 2013-12-24: qty 5

## 2013-12-24 MED ORDER — LIDOCAINE HCL (CARDIAC) 20 MG/ML IV SOLN
INTRAVENOUS | Status: DC | PRN
  Administered 2013-12-24: 100 mg via INTRAVENOUS

## 2013-12-24 MED ORDER — PROPOFOL INFUSION 10 MG/ML OPTIME
INTRAVENOUS | Status: DC | PRN
  Administered 2013-12-24: 120 ug/kg/min via INTRAVENOUS

## 2013-12-24 MED ORDER — LACTATED RINGERS IV SOLN
INTRAVENOUS | Status: DC
  Administered 2013-12-24: 13:00:00 via INTRAVENOUS

## 2013-12-24 MED ORDER — LACTATED RINGERS IV SOLN: INTRAVENOUS | Status: DC

## 2013-12-24 MED ORDER — SODIUM CHLORIDE 0.9 % IV SOLN: INTRAVENOUS | Status: DC

## 2013-12-24 SURGICAL SUPPLY — 22 items

## 2013-12-24 NOTE — Discharge Instructions (Signed)
Colonoscopy, Care After °Refer to this sheet in the next few weeks. These instructions provide you with information on caring for yourself after your procedure. Your health care provider may also give you more specific instructions. Your treatment has been planned according to current medical practices, but problems sometimes occur. Call your health care provider if you have any problems or questions after your procedure. °WHAT TO EXPECT AFTER THE PROCEDURE  °After your procedure, it is typical to have the following: °· A small amount of blood in your stool. °· Moderate amounts of gas and mild abdominal cramping or bloating. °HOME CARE INSTRUCTIONS °· Do not drive, operate machinery, or sign important documents for 24 hours. °· You may shower and resume your regular physical activities, but move at a slower pace for the first 24 hours. °· Take frequent rest periods for the first 24 hours. °· Walk around or put a warm pack on your abdomen to help reduce abdominal cramping and bloating. °· Drink enough fluids to keep your urine clear or pale yellow. °· You may resume your normal diet as instructed by your health care provider. Avoid heavy or fried foods that are hard to digest. °· Avoid drinking alcohol for 24 hours or as instructed by your health care provider. °· Only take over-the-counter or prescription medicines as directed by your health care provider. °· If a tissue sample (biopsy) was taken during your procedure: °¨ Do not take aspirin or blood thinners for 7 days, or as instructed by your health care provider. °¨ Do not drink alcohol for 7 days, or as instructed by your health care provider. °¨ Eat soft foods for the first 24 hours. °SEEK MEDICAL CARE IF: °You have persistent spotting of blood in your stool 2-3 days after the procedure. °SEEK IMMEDIATE MEDICAL CARE IF: °· You have more than a small spotting of blood in your stool. °· You pass large blood clots in your stool. °· Your abdomen is swollen  (distended). °· You have nausea or vomiting. °· You have a fever. °· You have increasing abdominal pain that is not relieved with medicine. °Document Released: 01/12/2004 Document Revised: 03/20/2013 Document Reviewed: 02/04/2013 °ExitCare® Patient Information ©2015 [employer]. This information is not intended to replace advice given to you by your health care provider. Make sure you discuss any questions you have with your health care provider. ° ° °Colon Polyps °Polyps are lumps of extra tissue growing inside the body. Polyps can grow in the large intestine (colon). Most colon polyps are noncancerous (benign). However, some colon polyps can become cancerous over time. Polyps that are larger than a pea may be harmful. To be safe, caregivers remove and test all polyps. °CAUSES  °Polyps form when mutations in the genes cause your cells to grow and divide even though no more tissue is needed. °RISK FACTORS °There are a number of risk factors that can increase your chances of getting colon polyps. They include: °· Being older than 50 years. °· Family history of colon polyps or colon cancer. °· Long-term colon diseases, such as colitis or Crohn disease. °· Being overweight. °· Smoking. °· Being inactive. °· Drinking too much alcohol. °SYMPTOMS  °Most small polyps do not cause symptoms. If symptoms are present, they may include: °· Blood in the stool. The stool may look dark red or black. °· Constipation or diarrhea that lasts longer than 1 week. °DIAGNOSIS °People often do not know they have polyps until their caregiver finds them during a regular checkup.   Your caregiver can use 4 tests to check for polyps: °· Digital rectal exam. The caregiver wears gloves and feels inside the rectum. This test would find polyps only in the rectum. °· Barium enema. The caregiver puts a liquid called barium into your rectum before taking X-rays of your colon. Barium makes your colon look white. Polyps are dark, so they are easy to  see in the X-ray pictures. °· Sigmoidoscopy. A thin, flexible tube (sigmoidoscope) is placed into your rectum. The sigmoidoscope has a light and tiny camera in it. The caregiver uses the sigmoidoscope to look at the last third of your colon. °· Colonoscopy. This test is like sigmoidoscopy, but the caregiver looks at the entire colon. This is the most common method for finding and removing polyps. °TREATMENT  °Any polyps will be removed during a sigmoidoscopy or colonoscopy. The polyps are then tested for cancer. °PREVENTION  °To help lower your risk of getting more colon polyps: °· Eat plenty of fruits and vegetables. Avoid eating fatty foods. °· Do not smoke. °· Avoid drinking alcohol. °· Exercise every day. °· Lose weight if recommended by your caregiver. °· Eat plenty of calcium and folate. Foods that are rich in calcium include milk, cheese, and broccoli. Foods that are rich in folate include chickpeas, kidney beans, and spinach. °HOME CARE INSTRUCTIONS °Keep all follow-up appointments as directed by your caregiver. You may need periodic exams to check for polyps. °SEEK MEDICAL CARE IF: °You notice bleeding during a bowel movement. °Document Released: 02/24/2004 Document Revised: 08/22/2011 Document Reviewed: 08/09/2011 °ExitCare® Patient Information ©2015 [employer]. This information is not intended to replace advice given to you by your health care provider. Make sure you discuss any questions you have with your health care provider. ° ° °

## 2013-12-24 NOTE — Anesthesia Postprocedure Evaluation (Signed)
  Anesthesia Post-op Note  Patient: Shelly SimmerJody D Corbit  Procedure(s) Performed: Procedure(s) (LRB): COLONOSCOPY WITH PROPOFOL (N/A)  Patient Location: PACU  Anesthesia Type: MAC  Level of Consciousness: awake and alert   Airway and Oxygen Therapy: Patient Spontanous Breathing  Post-op Pain: mild  Post-op Assessment: Post-op Vital signs reviewed, Patient's Cardiovascular Status Stable, Respiratory Function Stable, Patent Airway and No signs of Nausea or vomiting  Last Vitals:  Filed Vitals:   12/24/13 1445  BP: 152/85  Pulse: 81  Temp:   Resp: 12    Post-op Vital Signs: stable   Complications: No apparent anesthesia complications

## 2013-12-24 NOTE — H&P (Signed)
  Procedure: Baseline screening colonoscopy  History: The patient is a 58 year old female born 1956-01-19. She is scheduled to undergo her first screening colonoscopy with polypectomy to prevent colon cancer.  Medication allergies: Septra. Atorvastatin.  Past medical history: Type 2 diabetes mellitus. Cervical spine surgery. Fatty appearing liver by abdominal ultrasound. Depression.  Exam: The patient is alert and lying comfortably on the endoscopy stretcher. Abdomen is soft and nontender to palpation. Lungs are clear to auscultation. Cardiac exam reveals a regular rhythm.  Plan: Proceed with baseline screening colonoscopy using propofol sedation

## 2013-12-24 NOTE — Anesthesia Preprocedure Evaluation (Addendum)
Anesthesia Evaluation  Patient identified by MRN, date of birth, ID band Patient awake    Reviewed: Allergy & Precautions, H&P , NPO status , Patient's Chart, lab work & pertinent test results  Airway Mallampati: II  TM Distance: >3 FB Neck ROM: full    Dental no notable dental hx. (+) Teeth Intact, Dental Advisory Given   Pulmonary neg pulmonary ROS,  breath sounds clear to auscultation  Pulmonary exam normal       Cardiovascular Exercise Tolerance: Good hypertension, Pt. on medications Rhythm:regular Rate:Normal     Neuro/Psych negative neurological ROS  negative psych ROS   GI/Hepatic negative GI ROS, Neg liver ROS,   Endo/Other  diabetes, Well Controlled, Type 2, Oral Hypoglycemic Agents  Renal/GU negative Renal ROS  negative genitourinary   Musculoskeletal   Abdominal   Peds  Hematology negative hematology ROS (+)   Anesthesia Other Findings   Reproductive/Obstetrics negative OB ROS                             Anesthesia Physical Anesthesia Plan  ASA: III  Anesthesia Plan: MAC   Post-op Pain Management:    Induction:   Airway Management Planned:   Additional Equipment:   Intra-op Plan:   Post-operative Plan:   Informed Consent: I have reviewed the patients History and Physical, chart, labs and discussed the procedure including the risks, benefits and alternatives for the proposed anesthesia with the patient or authorized representative who has indicated his/her understanding and acceptance.   Dental Advisory Given  Plan Discussed with: CRNA and Surgeon  Anesthesia Plan Comments:         Anesthesia Quick Evaluation  

## 2013-12-24 NOTE — Transfer of Care (Signed)
Immediate Anesthesia Transfer of Care Note  Patient: Shelly Mcdaniel  Procedure(s) Performed: Procedure(s): COLONOSCOPY WITH PROPOFOL (N/A)  Patient Location: PACU and Endoscopy Unit  Anesthesia Type:MAC  Level of Consciousness: awake, alert  and patient cooperative  Airway & Oxygen Therapy: Patient Spontanous Breathing and Patient connected to face mask oxygen  Post-op Assessment: Report given to PACU RN, Post -op Vital signs reviewed and stable and Patient moving all extremities X 4  Post vital signs: Reviewed and stable  Complications: No apparent anesthesia complications

## 2013-12-24 NOTE — Op Note (Signed)
Procedure: Baseline screening colonoscopy  Endoscopist: Danise EdgeMartin Johnson  Premedication: Propofol administered by anesthesia  Procedure: The patient was placed in the left lateral decubitus position. Anal inspection and digital rectal exam were normal. The Pentax pediatric colonoscope was introduced into the rectum and advanced to the cecum. A normal-appearing ileocecal valve and appendiceal orifice were identified. Colonic preparation for the exam today was good.  Rectum. Normal. Retroflex view of the distal rectum normal  Sigmoid colon and descending colon. Normal  Splenic flexure. Normal  Transverse colon. Normal  Hepatic flexure. Normal  Ascending colon. Normal  Cecum and ileocecal valve. Normal  Assessment: Normal baseline screening colonoscopy  Recommendation: Schedule repeat screening colonoscopy in 10 years

## 2013-12-25 ENCOUNTER — Encounter (HOSPITAL_COMMUNITY): Payer: Self-pay | Admitting: Gastroenterology

## 2014-03-25 ENCOUNTER — Other Ambulatory Visit (HOSPITAL_COMMUNITY): Payer: Self-pay | Admitting: Geriatric Medicine

## 2014-03-25 DIAGNOSIS — M79641 Pain in right hand: Secondary | ICD-10-CM

## 2014-03-25 DIAGNOSIS — M79671 Pain in right foot: Secondary | ICD-10-CM

## 2014-03-25 DIAGNOSIS — R748 Abnormal levels of other serum enzymes: Secondary | ICD-10-CM

## 2014-04-01 ENCOUNTER — Encounter (HOSPITAL_COMMUNITY): Payer: Self-pay

## 2014-04-01 ENCOUNTER — Ambulatory Visit (HOSPITAL_COMMUNITY): Payer: BC Managed Care – PPO

## 2014-04-04 ENCOUNTER — Encounter (HOSPITAL_COMMUNITY)
Admission: RE | Admit: 2014-04-04 | Discharge: 2014-04-04 | Disposition: A | Discharge status: Home / Self Care | Payer: BC Managed Care – PPO | Source: Ambulatory Visit | Attending: Geriatric Medicine | Admitting: Geriatric Medicine

## 2014-04-04 ENCOUNTER — Encounter (HOSPITAL_COMMUNITY): Payer: Self-pay

## 2014-04-04 ENCOUNTER — Ambulatory Visit (HOSPITAL_COMMUNITY)
Admission: RE | Admit: 2014-04-04 | Discharge: 2014-04-04 | Disposition: A | Discharge status: Home / Self Care | Payer: BC Managed Care – PPO | Source: Ambulatory Visit | Attending: Geriatric Medicine | Admitting: Geriatric Medicine

## 2014-04-04 DIAGNOSIS — M79641 Pain in right hand: Secondary | ICD-10-CM | POA: Insufficient documentation

## 2014-04-04 DIAGNOSIS — M79674 Pain in right toe(s): Secondary | ICD-10-CM | POA: Insufficient documentation

## 2014-04-04 DIAGNOSIS — M79673 Pain in unspecified foot: Secondary | ICD-10-CM | POA: Diagnosis present

## 2014-04-04 DIAGNOSIS — R748 Abnormal levels of other serum enzymes: Secondary | ICD-10-CM | POA: Diagnosis present

## 2014-04-04 DIAGNOSIS — M79671 Pain in right foot: Secondary | ICD-10-CM | POA: Insufficient documentation

## 2014-04-04 MED ORDER — TECHNETIUM TC 99M MEDRONATE IV KIT
25.0000 | PACK | Freq: Once | INTRAVENOUS | Status: AC | PRN
  Administered 2014-04-04: 25 via INTRAVENOUS

## 2014-05-21 ENCOUNTER — Ambulatory Visit
Admission: RE | Admit: 2014-05-21 | Discharge: 2014-05-21 | Disposition: A | Discharge status: Home / Self Care | Payer: BC Managed Care – PPO | Source: Ambulatory Visit | Attending: Geriatric Medicine | Admitting: Geriatric Medicine

## 2014-05-21 ENCOUNTER — Other Ambulatory Visit: Payer: Self-pay | Admitting: Geriatric Medicine

## 2014-05-21 DIAGNOSIS — M47813 Spondylosis without myelopathy or radiculopathy, cervicothoracic region: Secondary | ICD-10-CM

## 2014-11-07 ENCOUNTER — Observation Stay (HOSPITAL_COMMUNITY)
Admission: EM | Admit: 2014-11-07 | Discharge: 2014-11-10 | Disposition: A | Discharge status: Home / Self Care | Payer: BC Managed Care – PPO | Attending: Internal Medicine | Admitting: Internal Medicine

## 2014-11-07 ENCOUNTER — Emergency Department (HOSPITAL_COMMUNITY): Payer: BC Managed Care – PPO

## 2014-11-07 ENCOUNTER — Encounter (HOSPITAL_COMMUNITY): Payer: Self-pay | Admitting: Emergency Medicine

## 2014-11-07 DIAGNOSIS — Z882 Allergy status to sulfonamides status: Secondary | ICD-10-CM | POA: Diagnosis not present

## 2014-11-07 DIAGNOSIS — Z888 Allergy status to other drugs, medicaments and biological substances status: Secondary | ICD-10-CM | POA: Diagnosis not present

## 2014-11-07 DIAGNOSIS — E1165 Type 2 diabetes mellitus with hyperglycemia: Secondary | ICD-10-CM | POA: Diagnosis not present

## 2014-11-07 DIAGNOSIS — Z8249 Family history of ischemic heart disease and other diseases of the circulatory system: Secondary | ICD-10-CM | POA: Diagnosis not present

## 2014-11-07 DIAGNOSIS — Z885 Allergy status to narcotic agent status: Secondary | ICD-10-CM | POA: Diagnosis not present

## 2014-11-07 DIAGNOSIS — E785 Hyperlipidemia, unspecified: Secondary | ICD-10-CM | POA: Diagnosis not present

## 2014-11-07 DIAGNOSIS — E119 Type 2 diabetes mellitus without complications: Secondary | ICD-10-CM | POA: Diagnosis not present

## 2014-11-07 DIAGNOSIS — G459 Transient cerebral ischemic attack, unspecified: Secondary | ICD-10-CM | POA: Diagnosis present

## 2014-11-07 DIAGNOSIS — I10 Essential (primary) hypertension: Secondary | ICD-10-CM | POA: Diagnosis not present

## 2014-11-07 DIAGNOSIS — Z794 Long term (current) use of insulin: Secondary | ICD-10-CM | POA: Diagnosis not present

## 2014-11-07 DIAGNOSIS — Z823 Family history of stroke: Secondary | ICD-10-CM | POA: Insufficient documentation

## 2014-11-07 DIAGNOSIS — R42 Dizziness and giddiness: Principal | ICD-10-CM | POA: Insufficient documentation

## 2014-11-07 DIAGNOSIS — G450 Vertebro-basilar artery syndrome: Secondary | ICD-10-CM

## 2014-11-07 DIAGNOSIS — Z91030 Bee allergy status: Secondary | ICD-10-CM | POA: Diagnosis not present

## 2014-11-07 DIAGNOSIS — Z7982 Long term (current) use of aspirin: Secondary | ICD-10-CM | POA: Diagnosis not present

## 2014-11-07 DIAGNOSIS — E1121 Type 2 diabetes mellitus with diabetic nephropathy: Secondary | ICD-10-CM

## 2014-11-07 DIAGNOSIS — R270 Ataxia, unspecified: Secondary | ICD-10-CM | POA: Diagnosis not present

## 2014-11-07 LAB — CBC
HCT: 39.9 % (ref 36.0–46.0)
HEMOGLOBIN: 13.7 g/dL (ref 12.0–15.0)
MCH: 29.6 pg (ref 26.0–34.0)
MCHC: 34.3 g/dL (ref 30.0–36.0)
MCV: 86.2 fL (ref 78.0–100.0)
PLATELETS: 192 10*3/uL (ref 150–400)
RBC: 4.63 MIL/uL (ref 3.87–5.11)
RDW: 12.5 % (ref 11.5–15.5)
WBC: 5.6 10*3/uL (ref 4.0–10.5)

## 2014-11-07 LAB — BASIC METABOLIC PANEL
Anion gap: 11 (ref 5–15)
BUN: 15 mg/dL (ref 6–20)
CO2: 23 mmol/L (ref 22–32)
Calcium: 9.3 mg/dL (ref 8.9–10.3)
Chloride: 99 mmol/L — ABNORMAL LOW (ref 101–111)
Creatinine, Ser: 0.68 mg/dL (ref 0.44–1.00)
Glucose, Bld: 508 mg/dL — ABNORMAL HIGH (ref 65–99)
POTASSIUM: 3.8 mmol/L (ref 3.5–5.1)
SODIUM: 133 mmol/L — AB (ref 135–145)

## 2014-11-07 LAB — I-STAT TROPONIN, ED: Troponin i, poc: 0 ng/mL (ref 0.00–0.08)

## 2014-11-07 LAB — CBG MONITORING, ED: Glucose-Capillary: 488 mg/dL — ABNORMAL HIGH (ref 65–99)

## 2014-11-07 MED ORDER — ACETAMINOPHEN 500 MG PO TABS
1000.0000 mg | ORAL_TABLET | Freq: Once | ORAL | Status: AC
  Administered 2014-11-07: 1000 mg via ORAL
  Filled 2014-11-07: qty 2

## 2014-11-07 NOTE — H&P (Signed)
PCP:  Ginette Otto, MD    Referring provider Gwendolyn Grant   Chief Complaint:  dizziness  HPI: Shelly Mcdaniel is a 59 y.o. female   has a past medical history of Diabetes mellitus without complication; Hypertension; and Hyperlipidemia.   Presented with dizziness and lightheadedness that occurred today at 3:30 while getting pedicure. Patient was endorsing some significant frontal headache. She took some ibuprofen and tylenol. She took a nap and then set up the computer for 3 h. Patient endorses some spinning sensation once she got up. Endorses being unstable when she tried to ambulate the sitting down for some time. The symptoms of unsteady gait occurred at 6 PM.  Her son works with EMS and was concerned brought her in to emergency department. Of note the patient has endorsed some right-sided facial pressure which she was attributed to sinuses and was associated with ear ache.  Neurology has been consulted. At first code stroke was called but the patient's symptoms improved he was canceled, and she was not a candidate for tPA. At the time of presentation patient was 4 hours out from onset.  Per neurology consult patient is to be transferred to [facility] for TIA workup patient's past medical history significant for hypertension diabetes.  Emergency Department CT scan of the head showing no acute intracranial abnormality no evidence of infarction. But a small infarct could not be ruled out in the left internal capsule posterior limb. Denies any slurred speech, denies any weakness Hospitalist was called for admission for TIA.   Review of Systems:    Pertinent positives include:  Headache,  unsteady gait  Constitutional:  No weight loss, night sweats, Fevers, chills, fatigue, weight loss  HEENT:   Difficulty swallowing,Tooth/dental problems,Sore throat,  No sneezing, itching, ear ache, nasal congestion, post nasal drip,  Cardio-vascular:  No chest pain, Orthopnea, PND, anasarca,  dizziness, palpitations.no Bilateral lower extremity swelling  GI:  No heartburn, indigestion, abdominal pain, nausea, vomiting, diarrhea, change in bowel habits, loss of appetite, melena, blood in stool, hematemesis Resp:  no shortness of breath at rest. No dyspnea on exertion, No excess mucus, no productive cough, No non-productive cough, No coughing up of blood.No change in color of mucus.No wheezing. Skin:  no rash or lesions. No jaundice GU:  no dysuria, change in color of urine, no urgency or frequency. No straining to urinate.  No flank pain.  Musculoskeletal:  No joint pain or no joint swelling. No decreased range of motion. No back pain.  Psych:  No change in mood or affect. No depression or anxiety. No memory loss.  Neuro: no localizing neurological complaints, no tingling, no weakness, no double vision , no slurred speech, no confusion  Otherwise ROS are negative except for above, 10 systems were reviewed  Past Medical History: Past Medical History  Diagnosis Date  . Diabetes mellitus without complication   . Hypertension   . Hyperlipidemia    Past Surgical History  Procedure Laterality Date  . Breast surgery    . Back surgery    . Colonoscopy with propofol N/A 12/24/2013    Procedure: COLONOSCOPY WITH PROPOFOL;  Surgeon: Charolett Bumpers, MD;  Location: [facility];  Service: Endoscopy;  Laterality: N/A;     Medications: Prior to Admission medications   Medication Sig Start Date End Date Taking? Authorizing Provider  acetaminophen (TYLENOL) 500 MG tablet Take 500 mg by mouth every 6 (six) hours as needed for moderate pain (headache).   Yes Historical Provider, MD  gabapentin (  NEURONTIN) 300 MG capsule Take 300 mg by mouth at bedtime.  10/05/14  Yes Historical Provider, MD  ibuprofen (ADVIL,MOTRIN) 200 MG tablet Take 400 mg by mouth every 6 (six) hours as needed for headache (headache).   Yes Historical Provider, MD  LEVEMIR FLEXTOUCH 100 UNIT/ML Pen Inject 18-22  Units into the skin every evening.  11/06/14  Yes Historical Provider, MD  metFORMIN (GLUCOPHAGE-XR) 500 MG 24 hr tablet Take 2,000 mg by mouth at bedtime.   Yes Historical Provider, MD  quinapril-hydrochlorothiazide (ACCURETIC) 20-25 MG per tablet Take 1 tablet by mouth at bedtime.   Yes Historical Provider, MD  sertraline (ZOLOFT) 100 MG tablet Take 100 mg by mouth at bedtime.    Yes Historical Provider, MD  simvastatin (ZOCOR) 40 MG tablet Take 40 mg by mouth at bedtime.   Yes Historical Provider, MD    Allergies:   Allergies  Allergen Reactions  . Bee Venom Anaphylaxis  . Codeine Itching  . Septra [Sulfamethoxazole-Trimethoprim]     Mother died from Stevens-Johnson syndrome and sister had anaphylaxis from Septra.Physician told her never to take this medication.  . Simvastatin     Cramping when drug taken    Social History:  Ambulatory   Independently  Lives at home   With family     reports that she has never smoked. She does not have any smokeless tobacco history on file. She reports that she does not drink alcohol or use illicit drugs.    Family History: family history includes Diabetes in her other; Heart disease in her other; Hyperlipidemia in her other; Hypertension in her other; Stroke in her other.    Physical Exam: Patient Vitals for the past 24 hrs:  BP Temp Temp src Pulse Resp SpO2 Height Weight  11/07/14 2106 155/91 mmHg 97.7 F (36.5 C) Oral 82 20 99 % 5\' 8"  (1.727 m) 82.101 kg (181 lb)    1. General:  in No Acute distress 2. Psychological: Alert and Oriented 3. Head/ENT:   Moist  Mucous Membranes                          Head Non traumatic, neck supple                          Normal   Dentition 4. SKIN: normal   Skin turgor,  Skin clean Dry and intact no rash 5. Heart: Regular rate and rhythm no Murmur, Rub or gallop 6. Lungs: Clear to auscultation bilaterally, no wheezes or crackles   7. Abdomen: Soft, non-tender, Non distended 8. Lower extremities:  no clubbing, cyanosis, or edema 9. Neurologically strength 5 out of 5 in all 4 extremities cranial nerves II through XII intact 10. MSK: Normal range of motion  body mass index is 27.53 kg/(m^2).   Labs on Admission:   Results for orders placed or performed during the hospital encounter of 11/07/14 (from the past 24 hour(s))  CBC     Status: None   Collection Time: 11/07/14  9:56 PM  Result Value Ref Range   WBC 5.6 4.0 - 10.5 K/uL   RBC 4.63 3.87 - 5.11 MIL/uL   Hemoglobin 13.7 12.0 - 15.0 g/dL   HCT 16.139.9 09.636.0 - 04.546.0 %   MCV 86.2 78.0 - 100.0 fL   MCH 29.6 26.0 - 34.0 pg   MCHC 34.3 30.0 - 36.0 g/dL   RDW 40.912.5 81.111.5 - 91.415.5 %  Platelets 192 150 - 400 K/uL  Basic metabolic panel     Status: Abnormal   Collection Time: 11/07/14  9:56 PM  Result Value Ref Range   Sodium 133 (L) 135 - 145 mmol/L   Potassium 3.8 3.5 - 5.1 mmol/L   Chloride 99 (L) 101 - 111 mmol/L   CO2 23 22 - 32 mmol/L   Glucose, Bld 508 (H) 65 - 99 mg/dL   BUN 15 6 - 20 mg/dL   Creatinine, Ser 0.98 0.44 - 1.00 mg/dL   Calcium 9.3 8.9 - 11.9 mg/dL   GFR calc non Af Amer >60 >60 mL/min   GFR calc Af Amer >60 >60 mL/min   Anion gap 11 5 - 15  I-stat troponin, ED     Status: None   Collection Time: 11/07/14 10:03 PM  Result Value Ref Range   Troponin i, poc 0.00 0.00 - 0.08 ng/mL   Comment 3          CBG monitoring, ED     Status: Abnormal   Collection Time: 11/07/14 10:06 PM  Result Value Ref Range   Glucose-Capillary 488 (H) 65 - 99 mg/dL   Comment 1 Notify RN     UA pending  Lab Results  Component Value Date   HGBA1C 10.9* 01/06/2011    Estimated Creatinine Clearance: 86.2 mL/min (by C-G formula based on Cr of 0.68).  BNP (last 3 results) No results for input(s): PROBNP in the last 8760 hours.  Other results:  I have pearsonaly reviewed this: ECG REPORT  Rate: 81  Rhythm: Sinus rhythm ST&T Change: No ischemic changes QTC 460   Filed Weights   11/07/14 2106  Weight: 82.101 kg (181  lb)     Cultures: No results found for: SDES, SPECREQUEST, CULT, REPTSTATUS   Radiological Exams on Admission: Ct Head Wo Contrast  11/07/2014   CLINICAL DATA:  Headache, dizziness starting today  EXAM: CT HEAD WITHOUT CONTRAST  TECHNIQUE: Contiguous axial images were obtained from the base of the skull through the vertex without intravenous contrast.  COMPARISON:  None.  FINDINGS: No skull fracture is noted. Paranasal sinuses and mastoid air cells are unremarkable.  No intracranial hemorrhage, mass effect or midline shift. There is mild cerebral atrophy. There is mild periventricular and patchy subcortical white matter decreased attenuation probable due to chronic small vessel ischemic changes. Question small lacunar infarct in left posterior limb of internal capsule. See axial image 16. No definite evidence of acute cortical infarction. No mass lesion is noted on this unenhanced scan.  IMPRESSION: No acute intracranial abnormality. No definite acute cortical infarction. Mild cerebral atrophy. Patchy subcortical and mild periventricular white matter decreased attenuation probable due to chronic small vessel ischemic changes. Question small lacunar infarct in left internal capsule posterior limb.   Electronically Signed   By: Natasha Mead M.D.   On: 11/07/2014 22:35    Chart has been reviewed  Family   at  Bedside  plan of care was discussed with Husband,   Assessment/Plan  59 year old female with history of diabetes and hypertension, family history of multiple strokes presents with headache followed by ataxia and leaning to the right. Currently completely resolved neurology is aware admitted for TIA workup CT scan did not show overt stroke but could not rule out a small lacunar infarct  Present on Admission:  . TIA (transient ischemic attack) -  - will admit based on TIA/CVA protocol, await results of MRA/MRI (patient requested oral sedative), Carotid Doppler and  Echo, obtain cardiac enzymes,   ECG,   Lipid panel, TSH. Order PT/OT evaluation. Will make sure patient is on antiplatelet agent.   Neurology consult.     . Hypertension - permissive hypertension and first 24 hours Diabetes mellitus continue Levemir 20 units and order sensitive sliding scale   Prophylaxis: SCD      CODE STATUS:  FULL CODE  as per patient   Disposition:  To home once workup is complete and patient is stable  Other plan as per orders.  I have spent a total of 65 min on this admission extra time was taken to arrange for [facility] transfer.   Kaniel Kiang 11/07/2014, 11:05 PM  [facility]  Pager [phone]   after 2 AM please page floor coverage PA If 7AM-7PM, please contact the day team taking care of the patient  [email or web address]  Password [email or web address]

## 2014-11-07 NOTE — ED Notes (Signed)
Pt c/o HA, dizziness onset today, pt leaning to the right and tips over to the right when standing. Began feeling bad 2 days ago after receiving MMR, now having pressure to R side of face under eye with sneezing. A & O x 4.

## 2014-11-07 NOTE — ED Provider Notes (Signed)
CSN: [id number]     Arrival date & time 11/07/14  2101 History   First MD Initiated Contact with Patient 11/07/14 2119     Chief Complaint  Patient presents with  . Headache     (Consider location/radiation/quality/duration/timing/severity/associated sxs/prior Treatment) Patient is a 59 y.o. female presenting with headaches. The history is provided by the patient.  Headache Pain location:  Frontal Timing:  Constant Progression:  Resolved Associated symptoms: no cough, no fever and no vomiting     Past Medical History  Diagnosis Date  . Diabetes mellitus without complication   . Hypertension   . Hyperlipidemia    Past Surgical History  Procedure Laterality Date  . Breast surgery    . Back surgery    . Colonoscopy with propofol N/A 12/24/2013    Procedure: COLONOSCOPY WITH PROPOFOL;  Surgeon: Charolett BumpersMartin K Johnson, MD;  Location: [facility];  Service: Endoscopy;  Laterality: N/A;   Family History  Problem Relation Age of Onset  . Hypertension Other   . Hyperlipidemia Other   . Stroke Other   . Heart disease Other   . Diabetes Other   . Stroke Mother   . Dementia Mother   . Diabetes Father   . Stroke Father   . CAD Father   . Diabetes Sister   . Diabetes Brother   . CAD Brother   . Stroke Brother   . Kidney failure Brother    History  Substance Use Topics  . Smoking status: Never Smoker   . Smokeless tobacco: Not on file  . Alcohol Use: No   OB History    No data available     Review of Systems  Constitutional: Negative for fever.  Respiratory: Negative for cough and shortness of breath.   Gastrointestinal: Negative for vomiting.  Neurological: Positive for headaches (Earlier today, behind the right eye. Abated with sneezing. No relief with Tylenol or Motrin).  All other systems reviewed and are negative.     Allergies  Bee venom; Codeine; Septra; and Simvastatin  Home Medications   Prior to Admission medications   Medication Sig Start Date End Date  Taking? Authorizing Provider  acetaminophen (TYLENOL) 500 MG tablet Take 500 mg by mouth every 6 (six) hours as needed for moderate pain (headache).   Yes Historical Provider, MD  gabapentin (NEURONTIN) 300 MG capsule Take 300 mg by mouth at bedtime.  10/05/14  Yes Historical Provider, MD  ibuprofen (ADVIL,MOTRIN) 200 MG tablet Take 400 mg by mouth every 6 (six) hours as needed for headache (headache).   Yes Historical Provider, MD  LEVEMIR FLEXTOUCH 100 UNIT/ML Pen Inject 18-22 Units into the skin every evening.  11/06/14  Yes Historical Provider, MD  metFORMIN (GLUCOPHAGE-XR) 500 MG 24 hr tablet Take 2,000 mg by mouth at bedtime.   Yes Historical Provider, MD  quinapril-hydrochlorothiazide (ACCURETIC) 20-25 MG per tablet Take 1 tablet by mouth at bedtime.   Yes Historical Provider, MD  sertraline (ZOLOFT) 100 MG tablet Take 100 mg by mouth at bedtime.    Yes Historical Provider, MD  simvastatin (ZOCOR) 40 MG tablet Take 40 mg by mouth at bedtime.   Yes Historical Provider, MD   BP 155/91 mmHg  Pulse 82  Temp(Src) 97.7 F (36.5 C) (Oral)  Resp 20  Ht 5\' 8"  (1.727 m)  Wt 181 lb (82.101 kg)  BMI 27.53 kg/m2  SpO2 99% Physical Exam  Constitutional: She is oriented to person, place, and time. She appears well-developed and well-nourished. No distress.  HENT:  Head: Normocephalic and atraumatic.  Mouth/Throat: Oropharynx is clear and moist.  Eyes: EOM are normal. Pupils are equal, round, and reactive to light.  Neck: Normal range of motion. Neck supple.  Cardiovascular: Normal rate and regular rhythm.  Exam reveals no friction rub.   No murmur heard. Pulmonary/Chest: Effort normal and breath sounds normal. No respiratory distress. She has no wheezes. She has no rales.  Abdominal: Soft. She exhibits no distension. There is no tenderness. There is no rebound.  Musculoskeletal: Normal range of motion. She exhibits no edema.  Neurological: She is alert and oriented to person, place, and time. No  cranial nerve deficit or sensory deficit. She exhibits normal muscle tone. Coordination (right arm past-pointing) abnormal. GCS eye subscore is 4. GCS verbal subscore is 5. GCS motor subscore is 6.  Ataxia, falling to the right  Skin: No rash noted. She is not diaphoretic.  Nursing note and vitals reviewed.   ED Course  Procedures (including critical care time) Labs Review Labs Reviewed  BASIC METABOLIC PANEL - Abnormal; Notable for the following:    Sodium 133 (*)    Chloride 99 (*)    Glucose, Bld 508 (*)    All other components within normal limits  CBG MONITORING, ED - Abnormal; Notable for the following:    Glucose-Capillary 488 (*)    All other components within normal limits  URINE CULTURE  CBC  URINALYSIS, ROUTINE W REFLEX MICROSCOPIC (NOT AT [facility])  I-STAT TROPOININ, ED    Imaging Review Ct Head Wo Contrast  11/07/2014   CLINICAL DATA:  Headache, dizziness starting today  EXAM: CT HEAD WITHOUT CONTRAST  TECHNIQUE: Contiguous axial images were obtained from the base of the skull through the vertex without intravenous contrast.  COMPARISON:  None.  FINDINGS: No skull fracture is noted. Paranasal sinuses and mastoid air cells are unremarkable.  No intracranial hemorrhage, mass effect or midline shift. There is mild cerebral atrophy. There is mild periventricular and patchy subcortical white matter decreased attenuation probable due to chronic small vessel ischemic changes. Question small lacunar infarct in left posterior limb of internal capsule. See axial image 16. No definite evidence of acute cortical infarction. No mass lesion is noted on this unenhanced scan.  IMPRESSION: No acute intracranial abnormality. No definite acute cortical infarction. Mild cerebral atrophy. Patchy subcortical and mild periventricular white matter decreased attenuation probable due to chronic small vessel ischemic changes. Question small lacunar infarct in left internal capsule posterior limb.    Electronically Signed   By: Natasha Mead M.D.   On: 11/07/2014 22:35     EKG Interpretation   Date/Time:  Friday Nov 07 2014 21:59:14 EDT Ventricular Rate:  81 PR Interval:  158 QRS Duration: 87 QT Interval:  396 QTC Calculation: 460 R Axis:   40 Text Interpretation:  Sinus rhythm LAE, consider biatrial enlargement  Probable anteroseptal infarct, old No significant change since last  tracing Confirmed by Gwendolyn Grant  MD, Yohance Hathorne [id number]) on 11/07/2014 11:47:30 PM      MDM   Final diagnoses:  Ataxia    59 year old female here presenting with dizziness today. She felt woozy like things were spinning earlier today but that abated. She denied this dizziness being like previous vertigo. She also had a headache that was behind her right eye that improved after sneezing. She is feeling better and then stood up after working on the computer this evening and noticed she was having difficulty ambulating. She was walking down the hall and kept  falling into the wall. She last was walking at 6 PM.  Here she is ataxic and has right arm past-pointing on exam.  Spoke with Dr. Thad Ranger at 10 PM through [facility] he said we connected a code stroke and get a quick head CT for her. She is 4 hours out from onset. Head CT normal. I spoke with patient about her illness and Risks and benefits of TPA. Patient is a bit leery of receiving it. She ambulated again and was much improved from prior. She also reported some history of balance issues in the past and then some history of right arm difficulty with depth perception. The patient and her family decided against TPA at this time. Patient admitted to [facility].   Elwin Mocha, MD 11/07/14 [phone]

## 2014-11-08 ENCOUNTER — Observation Stay (HOSPITAL_COMMUNITY): Payer: BC Managed Care – PPO

## 2014-11-08 DIAGNOSIS — G459 Transient cerebral ischemic attack, unspecified: Secondary | ICD-10-CM | POA: Diagnosis not present

## 2014-11-08 DIAGNOSIS — E119 Type 2 diabetes mellitus without complications: Secondary | ICD-10-CM | POA: Diagnosis not present

## 2014-11-08 DIAGNOSIS — H8110 Benign paroxysmal vertigo, unspecified ear: Secondary | ICD-10-CM

## 2014-11-08 DIAGNOSIS — R42 Dizziness and giddiness: Secondary | ICD-10-CM | POA: Diagnosis not present

## 2014-11-08 DIAGNOSIS — R270 Ataxia, unspecified: Secondary | ICD-10-CM | POA: Diagnosis not present

## 2014-11-08 DIAGNOSIS — E785 Hyperlipidemia, unspecified: Secondary | ICD-10-CM | POA: Diagnosis not present

## 2014-11-08 DIAGNOSIS — I10 Essential (primary) hypertension: Secondary | ICD-10-CM | POA: Diagnosis not present

## 2014-11-08 DIAGNOSIS — E1165 Type 2 diabetes mellitus with hyperglycemia: Secondary | ICD-10-CM | POA: Diagnosis not present

## 2014-11-08 LAB — URINALYSIS, ROUTINE W REFLEX MICROSCOPIC
Bilirubin Urine: NEGATIVE
Glucose, UA: >1000 mg/dL — AB
Hgb urine dipstick: NEGATIVE
Ketones, ur: NEGATIVE mg/dL
Leukocytes, UA: NEGATIVE
Nitrite: NEGATIVE
PROTEIN: NEGATIVE mg/dL
Specific Gravity, Urine: 1.043 — ABNORMAL HIGH (ref 1.005–1.030)
Urobilinogen, UA: 0.2 mg/dL (ref 0.0–1.0)
pH: 5 (ref 5.0–8.0)

## 2014-11-08 LAB — GLUCOSE, CAPILLARY
GLUCOSE-CAPILLARY: 389 mg/dL — AB (ref 65–99)
GLUCOSE-CAPILLARY: 341 mg/dL — AB (ref 65–99)
GLUCOSE-CAPILLARY: 363 mg/dL — AB (ref 65–99)
GLUCOSE-CAPILLARY: 425 mg/dL — AB (ref 65–99)
Glucose-Capillary: 359 mg/dL — ABNORMAL HIGH (ref 65–99)
Glucose-Capillary: 335 mg/dL — ABNORMAL HIGH (ref 65–99)

## 2014-11-08 LAB — TROPONIN I: Troponin I: <0.03 ng/mL (ref <0.031)

## 2014-11-08 LAB — LIPID PANEL
Cholesterol: 177 mg/dL (ref 0–200)
HDL: 28 mg/dL — AB (ref >40)
LDL Cholesterol: 90 mg/dL (ref 0–99)
Total CHOL/HDL Ratio: 6.3 RATIO
Triglycerides: 294 mg/dL — ABNORMAL HIGH (ref <150)
VLDL: 59 mg/dL — AB (ref 0–40)

## 2014-11-08 LAB — URINE MICROSCOPIC-ADD ON

## 2014-11-08 MED ORDER — LORAZEPAM 2 MG/ML IJ SOLN
1.0000 mg | Freq: Once | INTRAMUSCULAR | Status: AC
  Administered 2014-11-08: 1 mg via INTRAVENOUS
  Filled 2014-11-08: qty 1

## 2014-11-08 MED ORDER — INSULIN DETEMIR 100 UNIT/ML ~~LOC~~ SOLN
20.0000 [IU] | Freq: Every evening | SUBCUTANEOUS | Status: DC
  Filled 2014-11-08: qty 0.2

## 2014-11-08 MED ORDER — STROKE: EARLY STAGES OF RECOVERY BOOK
Freq: Once | Status: AC
  Administered 2014-11-08: 02:00:00

## 2014-11-08 MED ORDER — ATORVASTATIN CALCIUM 80 MG PO TABS
80.0000 mg | ORAL_TABLET | Freq: Every day | ORAL | Status: DC
  Filled 2014-11-08 (×2): qty 1

## 2014-11-08 MED ORDER — HYDRALAZINE HCL 20 MG/ML IJ SOLN: 10.0000 mg | Freq: Four times a day (QID) | INTRAMUSCULAR | Status: DC | PRN

## 2014-11-08 MED ORDER — LISINOPRIL 5 MG PO TABS
5.0000 mg | ORAL_TABLET | Freq: Every day | ORAL | Status: DC
  Administered 2014-11-08 – 2014-11-10 (×3): 5 mg via ORAL
  Filled 2014-11-08 (×3): qty 1

## 2014-11-08 MED ORDER — SIMVASTATIN 40 MG PO TABS
40.0000 mg | ORAL_TABLET | Freq: Every day | ORAL | Status: DC
  Administered 2014-11-08: 40 mg via ORAL
  Filled 2014-11-08 (×2): qty 1

## 2014-11-08 MED ORDER — SERTRALINE HCL 100 MG PO TABS
100.0000 mg | ORAL_TABLET | Freq: Every day | ORAL | Status: DC
  Administered 2014-11-08 – 2014-11-09 (×3): 100 mg via ORAL
  Filled 2014-11-08 (×3): qty 1

## 2014-11-08 MED ORDER — INSULIN ASPART 100 UNIT/ML ~~LOC~~ SOLN
0.0000 [IU] | Freq: Three times a day (TID) | SUBCUTANEOUS | Status: DC
  Administered 2014-11-08: 7 [IU] via SUBCUTANEOUS
  Administered 2014-11-08: 9 [IU] via SUBCUTANEOUS
  Administered 2014-11-08 – 2014-11-09 (×2): 7 [IU] via SUBCUTANEOUS
  Administered 2014-11-09: 9 [IU] via SUBCUTANEOUS
  Administered 2014-11-09: 7 [IU] via SUBCUTANEOUS
  Administered 2014-11-10 (×2): 3 [IU] via SUBCUTANEOUS

## 2014-11-08 MED ORDER — INSULIN ASPART 100 UNIT/ML ~~LOC~~ SOLN: 0.0000 [IU] | Freq: Three times a day (TID) | SUBCUTANEOUS | Status: DC

## 2014-11-08 MED ORDER — ASPIRIN EC 81 MG PO TBEC
81.0000 mg | DELAYED_RELEASE_TABLET | Freq: Every day | ORAL | Status: DC
Start: 2014-11-08 — End: 2014-11-10

## 2014-11-08 MED ORDER — INSULIN DETEMIR 100 UNIT/ML ~~LOC~~ SOLN
22.0000 [IU] | Freq: Every evening | SUBCUTANEOUS | Status: DC
  Administered 2014-11-08 – 2014-11-09 (×2): 22 [IU] via SUBCUTANEOUS
  Filled 2014-11-08 (×2): qty 0.22

## 2014-11-08 MED ORDER — ALPRAZOLAM 0.5 MG PO TABS: 0.5000 mg | ORAL_TABLET | Freq: Once | ORAL | Status: DC

## 2014-11-08 MED ORDER — INSULIN ASPART 100 UNIT/ML ~~LOC~~ SOLN
3.0000 [IU] | Freq: Three times a day (TID) | SUBCUTANEOUS | Status: DC
  Administered 2014-11-08 – 2014-11-09 (×4): 3 [IU] via SUBCUTANEOUS

## 2014-11-08 MED ORDER — ASPIRIN 325 MG PO TABS
325.0000 mg | ORAL_TABLET | Freq: Every day | ORAL | Status: DC
  Administered 2014-11-08 – 2014-11-10 (×3): 325 mg via ORAL
  Filled 2014-11-08 (×3): qty 1

## 2014-11-08 MED ORDER — INSULIN ASPART 100 UNIT/ML ~~LOC~~ SOLN
0.0000 [IU] | Freq: Every day | SUBCUTANEOUS | Status: DC
  Administered 2014-11-08 – 2014-11-09 (×3): 5 [IU] via SUBCUTANEOUS

## 2014-11-08 MED ORDER — SODIUM CHLORIDE 0.9 % IV SOLN
INTRAVENOUS | Status: DC
  Administered 2014-11-08: 04:00:00 via INTRAVENOUS

## 2014-11-08 MED ORDER — IBUPROFEN 200 MG PO TABS
800.0000 mg | ORAL_TABLET | Freq: Three times a day (TID) | ORAL | Status: DC | PRN
  Administered 2014-11-08 – 2014-11-09 (×3): 800 mg via ORAL
  Filled 2014-11-08 (×4): qty 4

## 2014-11-08 MED ORDER — ACETAMINOPHEN 325 MG PO TABS
650.0000 mg | ORAL_TABLET | ORAL | Status: DC | PRN
  Administered 2014-11-08 – 2014-11-09 (×2): 650 mg via ORAL
  Filled 2014-11-08 (×2): qty 2

## 2014-11-08 MED ORDER — GABAPENTIN 300 MG PO CAPS
300.0000 mg | ORAL_CAPSULE | Freq: Every day | ORAL | Status: DC
  Administered 2014-11-08 – 2014-11-09 (×3): 300 mg via ORAL
  Filled 2014-11-08 (×3): qty 1

## 2014-11-08 NOTE — Progress Notes (Addendum)
Physical Therapy Vestibular Assessment    11/08/14 2009  Vestibular Assessment  General Observation Reports dizziness continues  Symptom Behavior  Type of Dizziness Spinning (Imbalance)  Frequency of Dizziness With movement in bed and any upright position  Duration of Dizziness Minutes  Aggravating Factors Turning head quickly;Supine to sit;Sit to stand;Rolling to left  Relieving Factors Lying supine;Closing eyes  Occulomotor Exam  Occulomotor Alignment Normal  Spontaneous Absent  Gaze-induced Absent  Vestibulo-Occular Reflex  VOR 1 Head Only (x 1 viewing) Normal  Positional Testing  Dix-Hallpike Dix-Hallpike Right;Dix-Hallpike Left  Dix-Hallpike Right  Dix-Hallpike Right Duration minutes  Dix-Hallpike Right Symptoms Right nystagmus (Geotrophic)  Dix-Hallpike Left  Dix-Hallpike Left Duration Minutes  Dix-Hallpike Left Symptoms Left nystagmus (Geotrophic - stronger response)  Durenda HurtSusan H. Renaldo Fiddleravis, PT, [facility] HospitalMBA Acute Rehab Services Pager [phone]

## 2014-11-08 NOTE — Progress Notes (Signed)
STROKE TEAM PROGRESS NOTE   HISTORY Shelly Mcdaniel is a 59 y.o. female who reports that she was in her usual state of health for the majority of the day yesterday. She sat down to do some work on the computer at about 1800 and noted no problems. When she stood up about 2 hours later she was off balance and leaning to the right as she attempted to walk. The patient had no nausea or vomiting. Although she has had vertigo in the past, this episode seemed worse than usual. Patient presented to [facility] for evaluation where a code stroke was called. NIHSS of 1.  Patient also reports pain in the right ear and pain around the right eye. She has had issues with ear infections in the right ear in the past.   Date last known well: Date: 11/07/2014 Time last known well: Time: 18:00 tPA Given: No: Patient refused   SUBJECTIVE (INTERVAL HISTORY) The patient's husband is present. She feels she is back to baseline although she has not tried ambulating.   OBJECTIVE Temp:  [97.6 F (36.4 C)-97.9 F (36.6 C)] 97.6 F (36.4 C) (05/28 1014) Pulse Rate:  [75-82] 78 (05/28 1014) Cardiac Rhythm:  [-] Normal sinus rhythm (05/28 0100) Resp:  [11-20] 19 (05/28 1014) BP: (143-173)/(75-91) 143/88 mmHg (05/28 1014) SpO2:  [98 %-99 %] 98 % (05/28 1014) Weight:  [82.101 kg (181 lb)-85.7 kg (188 lb 15 oz)] 85.7 kg (188 lb 15 oz) (05/28 0100)   Recent Labs Lab 11/07/14 2206 11/08/14 0114 11/08/14 0654 11/08/14 1231  GLUCAP 488* 389* 341* 359*    Recent Labs Lab 11/07/14 2156  NA 133*  K 3.8  CL 99*  CO2 23  GLUCOSE 508*  BUN 15  CREATININE 0.68  CALCIUM 9.3   No results for input(s): AST, ALT, ALKPHOS, BILITOT, PROT, ALBUMIN in the last 168 hours.  Recent Labs Lab 11/07/14 2156  WBC 5.6  HGB 13.7  HCT 39.9  MCV 86.2  PLT 192    Recent Labs Lab 11/08/14 0018  TROPONINI <0.03   No results for input(s): LABPROT, INR in the last 72 hours.  Recent Labs  11/08/14 0046   COLORURINE YELLOW  LABSPEC 1.043*  PHURINE 5.0  GLUCOSEU >1000*  HGBUR NEGATIVE  BILIRUBINUR NEGATIVE  KETONESUR NEGATIVE  PROTEINUR NEGATIVE  UROBILINOGEN 0.2  NITRITE NEGATIVE  LEUKOCYTESUR NEGATIVE       Component Value Date/Time   CHOL 177 11/08/2014 0527   TRIG 294* 11/08/2014 0527   HDL 28* 11/08/2014 0527   CHOLHDL 6.3 11/08/2014 0527   VLDL 59* 11/08/2014 0527   LDLCALC 90 11/08/2014 0527   Lab Results  Component Value Date   HGBA1C 10.9* 01/06/2011   No results found for: LABOPIA, COCAINSCRNUR, LABBENZ, AMPHETMU, THCU, LABBARB  No results for input(s): ETH in the last 168 hours.  Ct Head Wo Contrast 11/07/2014    No acute intracranial abnormality. No definite acute cortical infarction. Mild cerebral atrophy. Patchy subcortical and mild periventricular white matter decreased attenuation probable due to chronic small vessel ischemic changes. Question small lacunar infarct in left internal capsule posterior limb.   Mri Brain Without Contrast 11/08/2014     MRI HEAD IMPRESSION:   1. No acute intracranial infarct or other abnormality identified.  2. Atrophy with moderate chronic microvascular ischemic disease involving the supratentorial white matter.  3. 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     MRA HEAD IMPRESSION:   1. No proximal branch occlusion identified  within the intracranial circulation.  2. Atheromatous irregularity throughout the basilar artery with secondary a moderate multi focal narrowing. Basilar artery supplied via the dominant left vertebral artery which is widely patent. The diminutive right vertebral artery terminates in PICA.  3. Multi focal atheromatous irregularity with mild stenoses within the left M1 segment.  4. Multifocal atheromatous irregularity within the cavernous carotid arteries, left greater than right.  5. Fetal origin of the left PCA.      PHYSICAL EXAM Mental Status: Alert, oriented, thought content  appropriate. Speech fluent without evidence of aphasia. Able to follow 3 step commands without difficulty. Cranial Nerves: II: Visual fields grossly normal, pupils equal, round, reactive to light and accommodation III,IV, VI: ptosis not present, extra-ocular motions intact bilaterally V,VII: smile symmetric, facial light touch sensation normal bilaterally VIII: hearing normal bilaterally IX,X: gag reflex present XI: bilateral shoulder shrug XII: midline tongue extension Motor: Right :Upper extremity 5/5Left: Upper extremity 5/5 Lower extremity 5/5Lower extremity 5/5 Tone and bulk:normal tone throughout; no atrophy noted Sensory: Light touch intact throughout, bilaterally Deep Tendon Reflexes: 2+ throughout with an absent left AJ Plantars: Right: downgoingLeft: downgoing Cerebellar: normal finger-to-nose bilaterally Gait: Deferred       ASSESSMENT/PLAN [name] is a 59 y.o. female with history of diabetes mellitus, hypertension, and hyperlipidemia, presenting with gait ataxia. She did not receive IV t-PA due to patient declined.   Possible TIA  Resultant resolution of deficits  MRI  no acute infarct  MRA  no high-grade stenosis  Carotid Doppler  pending  2D Echo  pending  LDL 90  HgbA1c pending  SCDs for VTE prophylaxis  Diet heart healthy/carb modified Room service appropriate?: Yes; Fluid consistency:: Thin  no antithrombotic prior to admission, now on aspirin 325 mg orally every day  Patient counseled to be compliant with her antithrombotic medications  Ongoing aggressive stroke risk factor management  Therapy recommendations:  Pending  Disposition: Pending  Hypertension  Home meds: Accuretic  Stable   Hyperlipidemia  Home meds:  Zocor resumed in hospital  LDL 90 goal < 70  Change to  Lipitor  Continue statin at discharge  Diabetes  HgbA1c pending, goal < 7.0  Uncontrolled  Other Stroke Risk Factors  Obesity, Body mass index is 28.73 kg/(m^2).   Hx stroke/TIA  Family hx stroke (brother)   Other Active Problems    Other Pertinent History  Remote lacunar infarcts by MRI  Plan discharge once workup is complete.  Hospital day #   Delton SeeDavid Caramia Boutin PA-C [facility] Pager [phone] 11/08/2014, 2:22 PM     To contact Stroke Continuity provider, please refer to [email or web address]. After hours, contact General Neurology

## 2014-11-08 NOTE — Evaluation (Signed)
Physical Therapy Evaluation Patient Details Name: Shelly Mcdaniel MRN: [id number] DOB: 1956-03-07 Today's Date: 11/08/2014   History of Present Illness  Patient is a 59 yo female admitted 11/07/14 with dizziness and imbalance.  Work-up underway for TIA, with NIHSS score of 1.  PMH:  DM, HTN, HLD.  Patient also reports that she has had vertigo and BPPV in the past - 2 years ago.  Clinical Impression  Patient presents with problems listed below.  See prior note for Vestibular Assessment data.  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  Performed Epley maneuver to treat Lt posterior canal.  Patient with significant vertigo, impacting functional mobility in sitting and standing.  Patient unable to safely ambulate.  Do not feel patient is safe to d/c home today.  Patient will benefit from acute PT to continue vestibular rehab.  Recommend OP PT for Vestibular Rehab at discharge.    Follow Up Recommendations Outpatient PT;Supervision for mobility/OOB (For Vestibular Rehab)    Equipment Recommendations  Rolling walker with 5" wheels    Recommendations for Other Services       Precautions / Restrictions Precautions Precautions: Fall Precaution Comments: Dizziness and imbalance Restrictions Weight Bearing Restrictions: No      Mobility  Bed Mobility Overal bed mobility: Needs Assistance Bed Mobility: Rolling;Sidelying to Sit;Sit to Sidelying Rolling: Modified independent (Device/Increase time) Sidelying to sit: Modified independent (Device/Increase time);Min assist     Sit to sidelying: Modified independent (Device/Increase time) General bed mobility comments: Patient able to change position in bed.  Able to move to sitting, but then requires assist to maintain balance.  Patient with right lean due to spinning sensation.  Transfers Overall transfer level: Needs assistance Equipment used: Rolling walker (2 wheeled) Transfers: Sit to/from  Stand Sit to Stand: Mod assist         General transfer comment: Verbal cues for hand placement.  Assist to steady during transfer.  Mod assist to maintain balance, wtih strong lean to right.  Ambulation/Gait             General Gait Details: NT due to decreased balance in stance  Stairs            Wheelchair Mobility    Modified Rankin (Stroke Patients Only) Modified Rankin (Stroke Patients Only) Pre-Morbid Rankin Score: No symptoms Modified Rankin: Moderately severe disability     Balance Overall balance assessment: Needs assistance Sitting-balance support: Single extremity supported;Feet supported Sitting balance-Leahy Scale: Poor Sitting balance - Comments: Requires UE support for balance. Postural control: Right lateral lean Standing balance support: Bilateral upper extremity supported Standing balance-Leahy Scale: Poor                               Pertinent Vitals/Pain Pain Assessment: 0-10 Pain Score: 4  Pain Location: Headache Pain Descriptors / Indicators: Headache Pain Intervention(s): Monitored during session;Patient requesting pain meds-RN notified    Home Living Family/patient expects to be discharged to:: Private residence Living Arrangements: Spouse/significant other Available Help at Discharge: Family Type of Home: House Home Access: Stairs to enter Entrance Stairs-Rails: None Entrance Stairs-Number of Steps: 5 Home Layout: Able to live on main level with bedroom/bathroom Home Equipment: None      Prior Function Level of Independence: Independent               Hand Dominance        Extremity/Trunk Assessment   Upper Extremity  Assessment: Overall WFL for tasks assessed           Lower Extremity Assessment: Overall WFL for tasks assessed      Cervical / Trunk Assessment: Normal (h/o cervical surgery)  Communication   Communication: No difficulties  Cognition Arousal/Alertness: Awake/alert Behavior  During Therapy: WFL for tasks assessed/performed Overall Cognitive Status: Within Functional Limits for tasks assessed                      General Comments      Exercises        Assessment/Plan    PT Assessment Patient needs continued PT services  PT Diagnosis Difficulty walking;Acute pain   PT Problem List Decreased activity tolerance;Decreased balance;Decreased mobility;Decreased coordination;Decreased knowledge of use of DME;Pain (Dizziness)  PT Treatment Interventions DME instruction;Gait training;Functional mobility training;Therapeutic activities;Therapeutic exercise;Balance training;Patient/family education (Vestibular Rehab)   PT Goals (Current goals can be found in the Care Plan section) Acute Rehab PT Goals Patient Stated Goal: To stop the dizziness.  To walk PT Goal Formulation: With patient/family Time For Goal Achievement: 11/15/14 Potential to Achieve Goals: Good    Frequency Min 4X/week   Barriers to discharge        Co-evaluation               End of Session Equipment Utilized During Treatment: Gait belt Activity Tolerance: Patient limited by pain Patient left: in bed;with call bell/phone within reach;with family/visitor present Nurse Communication: Mobility status    Functional Assessment Tool Used: Clinical judgement Functional Limitation: Mobility: Walking and moving around Mobility: Walking and Moving Around Current Status ([id number](G8978): At least 40 percent but less than 60 percent impaired, limited or restricted Mobility: Walking and Moving Around Goal Status [phone](G8979): At least 1 percent but less than 20 percent impaired, limited or restricted    Time: 1253-1324 PT Time Calculation (min) (ACUTE ONLY): 31 min   Charges:   PT Evaluation $Initial PT Evaluation Tier I: 1 Procedure PT Treatments $Canalith Rep Proc: 8-22 mins   PT G Codes:   PT G-Codes **NOT FOR INPATIENT CLASS** Functional Assessment Tool Used: Clinical  judgement Functional Limitation: Mobility: Walking and moving around Mobility: Walking and Moving Around Current Status ([id number]([id number]): At least 40 percent but less than 60 percent impaired, limited or restricted Mobility: Walking and Moving Around Goal Status [phone]([id number]): At least 1 percent but less than 20 percent impaired, limited or restricted    Vena AustriaDavis, Milos Milligan H 11/08/2014, 7:53 PM Durenda HurtSusan H. Renaldo Fiddleravis, PT, [facility] CenterMBA Acute Rehab Services Pager [phone]

## 2014-11-08 NOTE — Progress Notes (Signed)
Pt arrived via [facility] to 4N20. Pt is alert and oriented.  Pt has slight headache.  Husband at bedside. MD paged of pt arrival.  Safety measures in place.  Will continue to monitor.  Estanislado EmmsAshley Schwarz, RN

## 2014-11-08 NOTE — Progress Notes (Signed)
[facility] PROGRESS NOTE  Shelly SimmerJody D Mcdaniel ZOX:[id number] DOB: [birth year] DOA: 11/07/2014 PCP: Ginette OttoSTONEKING,HAL THOMAS, MD  Assessment/Plan: 59 y/o female with PMH of HTN, DM, HPL presented with dizziness, mostly positional   1. Dizziness. Suspected BPPV. "+" dix haulpike. Neuro exam is non focal. MRI head: no acute findings. 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 Pend echo  -obtain OT eval/epley.  2. DM. Uncontrolled. Pt reports last ha1c>10. Resumed lantus at 22, add aspart 3 unit TID+ISS.pend ha1c 3. HTN. Resume home regimen  ACE. Hold hctz   Code Status: full Family Communication: d/w patient, her husband, family at the bedside  (indicate person spoken with, relationship, and if by phone, the number) Disposition Plan: pend OT, clinical improvement    Consultants:  Neuro   Procedures:  echo  Antibiotics:  none (indicate start date, and stop date if known)  HPI/Subjective: Alert   Objective: Filed Vitals:   11/08/14 1014  BP: 143/88  Pulse: 78  Temp: 97.6 F (36.4 C)  Resp: 19   No intake or output data in the 24 hours ending 11/08/14 1244 Filed Weights   11/07/14 2106 11/08/14 0100  Weight: 82.101 kg (181 lb) 85.7 kg (188 lb 15 oz)    Exam:   General:  Alert, no distress   Cardiovascular: s1,s2 rrr  Respiratory: CTA BL   Abdomen: soft, nt,nd   Musculoskeletal: no leg edema   Data Reviewed: Basic Metabolic Panel:  Recent Labs Lab 11/07/14 2156  NA 133*  K 3.8  CL 99*  CO2 23  GLUCOSE 508*  BUN 15  CREATININE 0.68  CALCIUM 9.3   Liver Function Tests: No results for input(s): AST, ALT, ALKPHOS, BILITOT, PROT, ALBUMIN in the last 168 hours. No results for input(s): LIPASE, AMYLASE in the last 168 hours. No results for input(s): AMMONIA in the last 168 hours. CBC:  Recent Labs Lab 11/07/14 2156  WBC 5.6  HGB 13.7  HCT 39.9  MCV 86.2  PLT 192   Cardiac Enzymes:  Recent Labs Lab 11/08/14 0018   TROPONINI <0.03   BNP (last 3 results) No results for input(s): BNP in the last 8760 hours.  ProBNP (last 3 results) No results for input(s): PROBNP in the last 8760 hours.  CBG:  Recent Labs Lab 11/07/14 2206 11/08/14 0114 11/08/14 0654 11/08/14 1231  GLUCAP 488* 389* 341* 359*    No results found for this or any previous visit (from the past 240 hour(s)).   Studies: Ct Head Wo Contrast  11/07/2014   CLINICAL DATA:  Headache, dizziness starting today  EXAM: CT HEAD WITHOUT CONTRAST  TECHNIQUE: Contiguous axial images were obtained from the base of the skull through the vertex without intravenous contrast.  COMPARISON:  None.  FINDINGS: No skull fracture is noted. Paranasal sinuses and mastoid air cells are unremarkable.  No intracranial hemorrhage, mass effect or midline shift. There is mild cerebral atrophy. There is mild periventricular and patchy subcortical white matter decreased attenuation probable due to chronic small vessel ischemic changes. Question small lacunar infarct in left posterior limb of internal capsule. See axial image 16. No definite evidence of acute cortical infarction. No mass lesion is noted on this unenhanced scan.  IMPRESSION: No acute intracranial abnormality. No definite acute cortical infarction. Mild cerebral atrophy. Patchy subcortical and mild periventricular white matter decreased attenuation probable due to chronic small vessel ischemic changes. Question small lacunar infarct in left internal capsule posterior limb.   Electronically Signed   By: Lang SnowLiviu  Pop M.D.   On: 11/07/2014 22:35   Mri Brain Without Contrast  11/08/2014   CLINICAL DATA:  Initial evaluation for gait ataxia.  EXAM: MRI HEAD WITHOUT CONTRAST  MRA HEAD WITHOUT CONTRAST  TECHNIQUE: Multiplanar, multiecho pulse sequences of the brain and surrounding structures were obtained without intravenous contrast. Angiographic images of the head were obtained using MRA technique without contrast.   COMPARISON:  Prior CT from 11/07/2014  FINDINGS: MRI HEAD FINDINGS  Mild diffuse prominence of the CSF containing spaces is compatible with generalized age-related cerebral atrophy. Patchy and confluent T2/FLAIR hyperintensity within the periventricular and deep white matter both cerebral hemispheres consistent with moderate chronic small vessel ischemic disease. Remote lacunar infarcts present within the left basal ganglia.  No abnormal foci of restricted diffusion to suggest acute intracranial infarct. Gray-white matter differentiation maintained. Normal intravascular flow voids preserved. No acute or chronic intracranial hemorrhage.  No mass lesion or midline shift. No hydrocephalus. No extra-axial fluid collection.  Craniocervical junction within normal limits. Degenerative changes present within the visualized upper cervical spine. Question compression deformity of the C4 vertebral body. Pituitary gland normal.  No acute abnormality about the orbits.  Paranasal sinuses are clear. No mastoid effusion. Inner ear structures normal.  Bone marrow signal intensity within normal limits.  MRA HEAD FINDINGS  ANTERIOR CIRCULATION:  Visualized distal cervical segments of the internal carotid arteries are widely patent with antegrade flow. The petrous segments are widely patent. Multi focal atheromatous irregularity present within the cavernous segments of the internal carotid arteries with associated mild to moderate multi focal stenoses, left worse than right. Mild narrowing of the supra clinoid left ICA. A1 segments mildly irregular but patent without focal high-grade stenosis. Anterior communicating artery grossly normal. Anterior cerebral arteries well opacified.  Multi focal atheromatous irregularity present within the M1 segments bilaterally, left greater than right. There are multi focal mild stenoses within the left M1 segment. Distal MCA branches are fairly symmetric.  POSTERIOR CIRCULATION:  Right vertebral  artery is diminutive and appears to terminate in the right posterior communicating artery. Dominant left vertebral artery widely patent to the vertebrobasilar junction. The left posterior inferior cerebellar arteries patent. There is moderate atheromatous irregularity throughout the basilar artery, greatest within its mid and distal aspect. Associated moderate narrowing. The superior cerebellar arteries are patent bilaterally. There is fetal origin of the left posterior cerebral artery with widely patent left posterior communicating artery. Left PCA well opacified to its distal branches. Right P1 and P2 segments widely patent.  No aneurysm.  IMPRESSION: MRI HEAD IMPRESSION:  1. No acute intracranial infarct or other abnormality identified. 2. Atrophy with moderate chronic microvascular ischemic disease involving the supratentorial white matter. 3. 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  MRA HEAD IMPRESSION:  1. No proximal branch occlusion identified within the intracranial circulation. 2. Atheromatous irregularity throughout the basilar artery with secondary a moderate multi focal narrowing. Basilar artery supplied via the dominant left vertebral artery which is widely patent. The diminutive right vertebral artery terminates in PICA. 3. Multi focal atheromatous irregularity with mild stenoses within the left M1 segment. 4. Multifocal atheromatous irregularity within the cavernous carotid arteries, left greater than right. 5. Fetal origin of the left PCA.   Electronically Signed   By: Rise Mu M.D.   On: 11/08/2014 08:21   Mr Maxine Glenn Head/brain Wo Cm  11/08/2014   CLINICAL DATA:  Initial evaluation for gait ataxia.  EXAM: MRI HEAD WITHOUT CONTRAST  MRA HEAD WITHOUT CONTRAST  TECHNIQUE: Multiplanar, multiecho pulse sequences of the brain and surrounding structures were obtained without intravenous contrast. Angiographic images of the head were obtained using MRA technique without  contrast.  COMPARISON:  Prior CT from 11/07/2014  FINDINGS: MRI HEAD FINDINGS  Mild diffuse prominence of the CSF containing spaces is compatible with generalized age-related cerebral atrophy. Patchy and confluent T2/FLAIR hyperintensity within the periventricular and deep white matter both cerebral hemispheres consistent with moderate chronic small vessel ischemic disease. Remote lacunar infarcts present within the left basal ganglia.  No abnormal foci of restricted diffusion to suggest acute intracranial infarct. Gray-white matter differentiation maintained. Normal intravascular flow voids preserved. No acute or chronic intracranial hemorrhage.  No mass lesion or midline shift. No hydrocephalus. No extra-axial fluid collection.  Craniocervical junction within normal limits. Degenerative changes present within the visualized upper cervical spine. Question compression deformity of the C4 vertebral body. Pituitary gland normal.  No acute abnormality about the orbits.  Paranasal sinuses are clear. No mastoid effusion. Inner ear structures normal.  Bone marrow signal intensity within normal limits.  MRA HEAD FINDINGS  ANTERIOR CIRCULATION:  Visualized distal cervical segments of the internal carotid arteries are widely patent with antegrade flow. The petrous segments are widely patent. Multi focal atheromatous irregularity present within the cavernous segments of the internal carotid arteries with associated mild to moderate multi focal stenoses, left worse than right. Mild narrowing of the supra clinoid left ICA. A1 segments mildly irregular but patent without focal high-grade stenosis. Anterior communicating artery grossly normal. Anterior cerebral arteries well opacified.  Multi focal atheromatous irregularity present within the M1 segments bilaterally, left greater than right. There are multi focal mild stenoses within the left M1 segment. Distal MCA branches are fairly symmetric.  POSTERIOR CIRCULATION:  Right  vertebral artery is diminutive and appears to terminate in the right posterior communicating artery. Dominant left vertebral artery widely patent to the vertebrobasilar junction. The left posterior inferior cerebellar arteries patent. There is moderate atheromatous irregularity throughout the basilar artery, greatest within its mid and distal aspect. Associated moderate narrowing. The superior cerebellar arteries are patent bilaterally. There is fetal origin of the left posterior cerebral artery with widely patent left posterior communicating artery. Left PCA well opacified to its distal branches. Right P1 and P2 segments widely patent.  No aneurysm.  IMPRESSION: MRI HEAD IMPRESSION:  1. No acute intracranial infarct or other abnormality identified. 2. Atrophy with moderate chronic microvascular ischemic disease involving the supratentorial white matter. 3. 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  MRA HEAD IMPRESSION:  1. No proximal branch occlusion identified within the intracranial circulation. 2. Atheromatous irregularity throughout the basilar artery with secondary a moderate multi focal narrowing. Basilar artery supplied via the dominant left vertebral artery which is widely patent. The diminutive right vertebral artery terminates in PICA. 3. Multi focal atheromatous irregularity with mild stenoses within the left M1 segment. 4. Multifocal atheromatous irregularity within the cavernous carotid arteries, left greater than right. 5. Fetal origin of the left PCA.   Electronically Signed   By: Rise Mu M.D.   On: 11/08/2014 08:21    Scheduled Meds: . ALPRAZolam  0.5 mg Oral Once  . aspirin  325 mg Oral Daily  . gabapentin  300 mg Oral QHS  . insulin aspart  0-5 Units Subcutaneous QHS  . insulin aspart  0-9 Units Subcutaneous TID WC  . insulin detemir  20 Units Subcutaneous QPM  . sertraline  100 mg Oral QHS  . simvastatin  40 mg Oral QHS   Continuous Infusions:    Active Problems:   Ataxia   TIA (transient ischemic attack)   Hypertension   DM II (diabetes mellitus, type II), controlled    Time spent: >35 minutes     Esperanza Sheets  [facility] Pager [phone]. If 7PM-7AM, please contact night-coverage at [email or web address], password [facility] 11/08/2014, 12:44 PM

## 2014-11-08 NOTE — ED Notes (Signed)
[facility] called for transport. 

## 2014-11-08 NOTE — Progress Notes (Signed)
Physical Therapy Treatment Patient Details Name: Shelly Mcdaniel MRN: [id number] DOB: 12-14-1955 Today's Date: 11/08/2014    History of Present Illness Patient is a 59 yo female admitted 11/07/14 with dizziness and imbalance.  Work-up underway for TIA, with NIHSS score of 1.  PMH:  DM, HTN, HLD.  Patient also reports that she has had vertigo and BPPV in the past - 2 years ago.    PT Comments    Returned this pm to treat Lt horizontal canal canalithiasis (possibly cupulolithiasis due to duration of symptoms).  Performed BBQ roll.  Patient noted improvement in sitting balance/imbalance following treatment.  Will return in am to continue assessment/treatment.   Follow Up Recommendations  Outpatient PT;Supervision for mobility/OOB (for Vestibular Rehab)     Equipment Recommendations  Rolling walker with 5" wheels    Recommendations for Other Services       Precautions / Restrictions Precautions Precautions: Fall Precaution Comments: Dizziness and imbalance Restrictions Weight Bearing Restrictions: No    Mobility  Bed Mobility Overal bed mobility: Needs Assistance Bed Mobility: Rolling;Sidelying to Sit;Sit to Sidelying Rolling: Modified independent (Device/Increase time) Sidelying to sit: Modified independent (Device/Increase time);Min assist     Sit to sidelying: Modified independent (Device/Increase time) General bed mobility comments: Patient able to change position in bed.  Able to move to sitting, but then requires assist to maintain balance.  Patient with right lean due to spinning sensation.  Transfers                    Ambulation/Gait                 Stairs            Wheelchair Mobility    Modified Rankin (Stroke Patients Only) Modified Rankin (Stroke Patients Only) Pre-Morbid Rankin Score: No symptoms Modified Rankin: Moderately severe disability     Balance   Sitting-balance support: Single extremity supported Sitting balance-Leahy  Scale: Poor                              Cognition Arousal/Alertness: Awake/alert Behavior During Therapy: WFL for tasks assessed/performed Overall Cognitive Status: Within Functional Limits for tasks assessed                      Exercises      General Comments        Pertinent Vitals/Pain Pain Assessment: 0-10 Pain Score: 5  Pain Location: Headache Pain Descriptors / Indicators: Headache Pain Intervention(s): Monitored during session;Patient requesting pain meds-RN notified    Home Living                      Prior Function            PT Goals (current goals can now be found in the care plan section) Progress towards PT goals: Progressing toward goals    Frequency  Min 4X/week    PT Plan Current plan remains appropriate    Co-evaluation             End of Session   Activity Tolerance: Patient limited by pain Patient left: in bed;with call bell/phone within reach;with family/visitor present     Time: 1841-1912 PT Time Calculation (min) (ACUTE ONLY): 31 min  Charges:  $Therapeutic Activity: 8-22 mins $Canalith Rep Proc: 8-22 mins  G Codes:      Vena Austria 11/08/2014, 8:17 PM Durenda Hurt. Renaldo Fiddler, [facility] Acute Rehab Services Pager [phone]

## 2014-11-08 NOTE — Progress Notes (Signed)
[facility] progress note. Chief complaint. Transfer note. History of present illness. This 59 year old female [facility] with complaints of lightheadedness and dizziness. Symptoms completely resolved and CT scan did not show any overt stroke but could not rule out small lacunar infarct. The case was discussed with neurology who [facility] and treatment. The patient is now arrived in transfer and I'm seeing her at bedside to ensure she remains clinically stable and that her orders transferred here appropriately. Patient has no complaints or questions at this time. Physical exam. Vital signs. Temperature 97.9, pulse 75, respiration 16, blood pressure 163/88. O2 sats 98%. General appearance. Well-developed female who is alert and in no distress. Cardiac. Rate and rhythm regular. Lungs. Breath sounds clear. Abdomen. Soft with positive bowel sounds. Neurologic. Cranial nerves II-12 grossly intact. No unilateral or focal defects. Impression/plan. Problem #1. TIA. [facility] and initiated on TIA/CVA protocol including MRI/MRA, carotid Doppler, echocardiogram, cardiac enzymes, ECG, lipid panel, TSH. Neurology is aware of patient's arrival and will see in consult. Problem #2. Hypertension. Permissive hypertension for 24 hours. Problem #3. Diabetes. Continue Levemir and sensitive sliding scale NovoLog. The patient appears clinically stable post transfer. All orders appear to have transferred appropriately.

## 2014-11-08 NOTE — Progress Notes (Signed)
*  PRELIMINARY RESULTS* Vascular Ultrasound Carotid Duplex (Doppler) has been completed.   Findings suggest 1-39% internal carotid artery stenosis bilaterally. Vertebral arteries are patent with antegrade flow.  11/08/2014 3:34 PM Gertie FeyMichelle Laneka Mcgrory, RVT, RDCS, RDMS

## 2014-11-08 NOTE — Progress Notes (Addendum)
Physical Therapy Vestibular Assessment    11/08/14 1615  Vestibular Assessment  General Observation Patient reports spinning/dizziness.  Reports she had BPPV 2 years ago.  Symptom Behavior  Type of Dizziness Spinning (Imbalance)  Frequency of Dizziness With movement in bed and any upright position  Duration of Dizziness Minutes  Aggravating Factors Turning head quickly;Supine to sit;Sit to stand;Rolling to left  Relieving Factors Lying supine;Closing eyes  Occulomotor Exam  Occulomotor Alignment Normal  Spontaneous Absent  Gaze-induced Absent  Smooth Pursuits Intact  Saccades Intact  Vestibulo-Occular Reflex  VOR 1 Head Only (x 1 viewing) Normal  Positional Testing  Dix-Hallpike Dix-Hallpike Right;Dix-Hallpike Left  Horizontal Canal Testing Horizontal Canal Right;Horizontal Canal Left  Dix-Hallpike Right  Dix-Hallpike Right Duration minutes  Dix-Hallpike Right Symptoms Right nystagmus  Dix-Hallpike Left  Dix-Hallpike Left Duration Minutes  Dix-Hallpike Left Symptoms Upbeat, left rotatory nystagmus  Horizontal Canal Right  Horizontal Canal Right Duration None  Horizontal Canal Right Symptoms Normal  Horizontal Canal Left  Horizontal Canal Left Duration Minutes  Horizontal Canal Left Symptoms Geotrophic  Darl PikesSusan H. Renaldo Fiddleravis, PT, [facility] Acute Rehab Services Pager [phone]

## 2014-11-08 NOTE — Consult Note (Signed)
Referring Physician: Gwendolyn Grant    Chief Complaint: Gait ataxia  HPI: Shelly Mcdaniel is an 59 y.o. female who reports that was in her usual state of health for the majority of the day yesterday.  She sat down to do some work on the computer at about 1800 and noted no problems.  When she stood up about 2 hours later she was off balance and leaning to the right as she attempted to walk.  The patient had no nausea or vomiting.  Although she has had vertigo in the past, this episode seemed worse than usual.  Patient presented to [facility] for evaluation where a code stroke was called.  NIHSS of 1.   Patient also reports pain in the right ear and pain around the right eye.  She has had issues with ear infections in the right ear in the past.     Date last known well: Date: 11/07/2014 Time last known well: Time: 18:00 tPA Given: No: Patient refused  Past Medical History  Diagnosis Date  . Diabetes mellitus without complication   . Hypertension   . Hyperlipidemia     Past Surgical History  Procedure Laterality Date  . Breast surgery    . Back surgery    . Colonoscopy with propofol N/A 12/24/2013    Procedure: COLONOSCOPY WITH PROPOFOL;  Surgeon: Charolett Bumpers, MD;  Location: [facility];  Service: Endoscopy;  Laterality: N/A;    Family History  Problem Relation Age of Onset  . Hypertension Other   . Hyperlipidemia Other   . Stroke Other   . Heart disease Other   . Diabetes Other   . Stroke Mother   . Dementia Mother   . Diabetes Father   . Stroke Father   . CAD Father   . Diabetes Sister   . Diabetes Brother   . CAD Brother   . Stroke Brother   . Kidney failure Brother    Social History:  reports that she has never smoked. She does not have any smokeless tobacco history on file. She reports that she does not drink alcohol or use illicit drugs.  Allergies:  Allergies  Allergen Reactions  . Bee Venom Anaphylaxis  . Codeine Itching  . Septra [Sulfamethoxazole-Trimethoprim]      Mother died from Stevens-Johnson syndrome and sister had anaphylaxis from Septra.Physician told her never to take this medication.  . Simvastatin     Cramping when drug taken    Medications:  I have reviewed the patient's current medications. Prior to Admission:  Prescriptions prior to admission  Medication Sig Dispense Refill Last Dose  . acetaminophen (TYLENOL) 500 MG tablet Take 500 mg by mouth every 6 (six) hours as needed for moderate pain (headache).   11/07/2014 at Unknown time  . gabapentin (NEURONTIN) 300 MG capsule Take 300 mg by mouth at bedtime.    11/06/2014 at Unknown time  . ibuprofen (ADVIL,MOTRIN) 200 MG tablet Take 400 mg by mouth every 6 (six) hours as needed for headache (headache).   11/07/2014 at Unknown time  . LEVEMIR FLEXTOUCH 100 UNIT/ML Pen Inject 18-22 Units into the skin every evening.    11/06/2014 at Unknown time  . metFORMIN (GLUCOPHAGE-XR) 500 MG 24 hr tablet Take 2,000 mg by mouth at bedtime.   11/06/2014 at Unknown time  . quinapril-hydrochlorothiazide (ACCURETIC) 20-25 MG per tablet Take 1 tablet by mouth at bedtime.   11/06/2014 at Unknown time  . sertraline (ZOLOFT) 100 MG tablet Take 100 mg by mouth at  bedtime.    11/06/2014 at Unknown time  . simvastatin (ZOCOR) 40 MG tablet Take 40 mg by mouth at bedtime.   11/06/2014 at Unknown time   Scheduled: . ALPRAZolam  0.5 mg Oral Once  . aspirin  325 mg Oral Daily  . gabapentin  300 mg Oral QHS  . insulin aspart  0-5 Units Subcutaneous QHS  . insulin aspart  0-9 Units Subcutaneous TID WC  . insulin detemir  20 Units Subcutaneous QPM  . sertraline  100 mg Oral QHS  . simvastatin  40 mg Oral QHS    ROS: History obtained from the patient  General ROS: negative for - chills, fatigue, fever, night sweats, weight gain or weight loss Psychological ROS: negative for - behavioral disorder, hallucinations, memory difficulties, mood swings or suicidal ideation Ophthalmic ROS: negative for - blurry vision, double  vision, eye pain or loss of vision ENT ROS: as noted in HPI Allergy and Immunology ROS: negative for - hives or itchy/watery eyes Hematological and Lymphatic ROS: negative for - bleeding problems, bruising or swollen lymph nodes Endocrine ROS: negative for - galactorrhea, hair pattern changes, polydipsia/polyuria or temperature intolerance Respiratory ROS: negative for - cough, hemoptysis, shortness of breath or wheezing Cardiovascular ROS: negative for - chest pain, dyspnea on exertion, edema or irregular heartbeat Gastrointestinal ROS: negative for - abdominal pain, diarrhea, hematemesis, nausea/vomiting or stool incontinence Genito-Urinary ROS: negative for - dysuria, hematuria, incontinence or urinary frequency/urgency Musculoskeletal ROS: negative for - joint swelling or muscular weakness Neurological ROS: as noted in HPI Dermatological ROS: negative for rash and skin lesion changes  Physical Examination: Blood pressure 163/88, pulse 75, temperature 97.9 F (36.6 C), temperature source Oral, resp. rate 16, height  (1.727 m), weight 85.7 kg (188 lb 15 oz), SpO2 98 %.  HEENT-  Normocephalic, no lesions, without obvious abnormality.  Normal external eye and conjunctiva.  Normal TM's bilaterally.  Normal auditory canals and external ears. Normal external nose, mucus membranes and septum.  Normal pharynx. Cardiovascular- S1, S2 normal, pulses palpable throughout   Lungs- chest clear, no wheezing, rales, normal symmetric air entry Abdomen- soft, non-tender; bowel sounds normal; no masses,  no organomegaly Extremities- no edema Lymph-no adenopathy palpable Musculoskeletal-no joint tenderness, deformity or swelling Skin-warm and dry, no hyperpigmentation, vitiligo, or suspicious lesions  Neurological Examination Mental Status: Alert, oriented, thought content appropriate.  Speech fluent without evidence of aphasia.  Able to follow 3 step commands without difficulty. Cranial  Nerves: II: Discs flat bilaterally; Visual fields grossly normal, pupils equal, round, reactive to light and accommodation III,IV, VI: ptosis not present, extra-ocular motions intact bilaterally V,VII: smile symmetric, facial light touch sensation normal bilaterally VIII: hearing normal bilaterally IX,X: gag reflex present XI: bilateral shoulder shrug XII: midline tongue extension Motor: Right : Upper extremity   5/5    Left:     Upper extremity   5/5  Lower extremity   5/5     Lower extremity   5/5 Tone and bulk:normal tone throughout; no atrophy noted Sensory: Pinprick and light touch intact throughout, bilaterally Deep Tendon Reflexes: 2+ throughout with an absent left AJ Plantars: Right: downgoing   Left: downgoing Cerebellar: normal finger-to-nose and normal heel-to-shin testing bilaterally.  With sitting if not distracted leans to the right Gait: Gait wide based with falling to the right     Laboratory Studies:  Basic Metabolic Panel:  Recent Labs Lab 11/07/14 2156  NA 133*  K 3.8  CL 99*  CO2 23  GLUCOSE 508*  BUN 15  CREATININE 0.68  CALCIUM 9.3    Liver Function Tests: No results for input(s): AST, ALT, ALKPHOS, BILITOT, PROT, ALBUMIN in the last 168 hours. No results for input(s): LIPASE, AMYLASE in the last 168 hours. No results for input(s): AMMONIA in the last 168 hours.  CBC:  Recent Labs Lab 11/07/14 2156  WBC 5.6  HGB 13.7  HCT 39.9  MCV 86.2  PLT 192    Cardiac Enzymes:  Recent Labs Lab 11/08/14 0018  TROPONINI <0.03    BNP: Invalid input(s): POCBNP  CBG:  Recent Labs Lab 11/07/14 2206 11/08/14 0114  GLUCAP 488* 389*    Microbiology: Results for orders placed or performed during the hospital encounter of 01/03/11  Surgical pcr screen     Status: None   Collection Time: 01/03/11  8:40 AM  Result Value Ref Range Status   MRSA, PCR NEGATIVE NEGATIVE Final   Staphylococcus aureus NEGATIVE NEGATIVE Final    Comment:         The Xpert SA Assay (FDA approved for NASAL specimens only), is one component of a comprehensive surveillance program.  It is not intended to diagnose infection nor to guide or monitor treatment.    Coagulation Studies: No results for input(s): LABPROT, INR in the last 72 hours.  Urinalysis:  Recent Labs Lab 11/08/14 0046  COLORURINE YELLOW  LABSPEC 1.043*  PHURINE 5.0  GLUCOSEU >1000*  HGBUR NEGATIVE  BILIRUBINUR NEGATIVE  KETONESUR NEGATIVE  PROTEINUR NEGATIVE  UROBILINOGEN 0.2  NITRITE NEGATIVE  LEUKOCYTESUR NEGATIVE    Lipid Panel: No results found for: CHOL, TRIG, HDL, CHOLHDL, VLDL, LDLCALC  HgbA1C:  Lab Results  Component Value Date   HGBA1C 10.9* 01/06/2011    Urine Drug Screen:  No results found for: LABOPIA, COCAINSCRNUR, LABBENZ, AMPHETMU, THCU, LABBARB  Alcohol Level: No results for input(s): ETH in the last 168 hours.  Other results:sinus  EKG: sinus rhythm at 81 bpm, LAE.  Imaging: Ct Head Wo Contrast  11/07/2014   CLINICAL DATA:  Headache, dizziness starting today  EXAM: CT HEAD WITHOUT CONTRAST  TECHNIQUE: Contiguous axial images were obtained from the base of the skull through the vertex without intravenous contrast.  COMPARISON:  None.  FINDINGS: No skull fracture is noted. Paranasal sinuses and mastoid air cells are unremarkable.  No intracranial hemorrhage, mass effect or midline shift. There is mild cerebral atrophy. There is mild periventricular and patchy subcortical white matter decreased attenuation probable due to chronic small vessel ischemic changes. Question small lacunar infarct in left posterior limb of internal capsule. See axial image 16. No definite evidence of acute cortical infarction. No mass lesion is noted on this unenhanced scan.  IMPRESSION: No acute intracranial abnormality. No definite acute cortical infarction. Mild cerebral atrophy. Patchy subcortical and mild periventricular white matter decreased attenuation probable due to  chronic small vessel ischemic changes. Question small lacunar infarct in left internal capsule posterior limb.   Electronically Signed   By: Natasha MeadLiviu  Pop M.D.   On: 11/07/2014 22:35    Assessment: 59 y.o. female presenting with gait ataxia.  Patient with multiple vascular risk factors.  Head CT personally reviewed and shows no acute changes.  Can not rule out an acute vascular event without further imaging.  Patient on no antiplatelet therapy at home.  Further work up recommended.    Stroke Risk Factors - diabetes mellitus, hyperlipidemia and hypertension  Plan: 1. HgbA1c, fasting lipid panel 2. MRI, MRA  of the brain without contrast 3. PT consult,  OT consult, Speech consult 4. Echocardiogram 5. Carotid dopplers 6. Prophylactic therapy-Antiplatelet med: Aspirin - dose 325mg  daily 7. NPO until RN stroke swallow screen 8. Telemetry monitoring 9. Frequent neuro checks   Case discussed with Dr. Smitty Pluck, MD [facility] [phone] 11/08/2014, 1:52 AM

## 2014-11-09 ENCOUNTER — Observation Stay (HOSPITAL_BASED_OUTPATIENT_CLINIC_OR_DEPARTMENT_OTHER): Payer: BC Managed Care – PPO

## 2014-11-09 DIAGNOSIS — H8110 Benign paroxysmal vertigo, unspecified ear: Secondary | ICD-10-CM | POA: Diagnosis not present

## 2014-11-09 DIAGNOSIS — G459 Transient cerebral ischemic attack, unspecified: Secondary | ICD-10-CM | POA: Diagnosis not present

## 2014-11-09 DIAGNOSIS — E119 Type 2 diabetes mellitus without complications: Secondary | ICD-10-CM | POA: Diagnosis not present

## 2014-11-09 DIAGNOSIS — R270 Ataxia, unspecified: Secondary | ICD-10-CM | POA: Diagnosis not present

## 2014-11-09 LAB — URINE CULTURE: Colony Count: 45000

## 2014-11-09 LAB — GLUCOSE, CAPILLARY
Glucose-Capillary: 386 mg/dL — ABNORMAL HIGH (ref 65–99)
Glucose-Capillary: 314 mg/dL — ABNORMAL HIGH (ref 65–99)
Glucose-Capillary: 315 mg/dL — ABNORMAL HIGH (ref 65–99)
Glucose-Capillary: 338 mg/dL — ABNORMAL HIGH (ref 65–99)
Glucose-Capillary: 388 mg/dL — ABNORMAL HIGH (ref 65–99)

## 2014-11-09 MED ORDER — MECLIZINE HCL 12.5 MG PO TABS
25.0000 mg | ORAL_TABLET | Freq: Once | ORAL | Status: AC
  Administered 2014-11-09: 25 mg via ORAL
  Filled 2014-11-09: qty 2

## 2014-11-09 MED ORDER — CIPROFLOXACIN HCL 0.3 % OP SOLN
1.0000 [drp] | OPHTHALMIC | Status: DC
  Administered 2014-11-09 – 2014-11-10 (×5): 1 [drp] via OPHTHALMIC
  Filled 2014-11-09: qty 2.5

## 2014-11-09 MED ORDER — SIMVASTATIN 40 MG PO TABS
80.0000 mg | ORAL_TABLET | Freq: Every day | ORAL | Status: DC
  Administered 2014-11-09: 80 mg via ORAL
  Filled 2014-11-09: qty 2

## 2014-11-09 MED ORDER — INSULIN ASPART 100 UNIT/ML FLEXPEN: 5.0000 [IU] | PEN_INJECTOR | Freq: Three times a day (TID) | SUBCUTANEOUS | Status: DC

## 2014-11-09 MED ORDER — MECLIZINE HCL 12.5 MG PO TABS: 25.0000 mg | ORAL_TABLET | Freq: Three times a day (TID) | ORAL | Status: DC | PRN

## 2014-11-09 NOTE — Progress Notes (Signed)
Physical Therapy Treatment Patient Details Name: Shelly SimmerJody D Whitter MRN: [id number] DOB: 11-19-1955 Today's Date: 11/09/2014    History of Present Illness Patient is a 59 yo female admitted 11/07/14 with dizziness and imbalance.  Work-up underway for TIA, with NIHSS score of 1.  PMH:  DM, HTN, HLD.  Patient also reports that she has had vertigo and BPPV in the past - 2 years ago.    PT Comments    Patient continues to make improvements with mobility.  Continues to have decreased balance with gait, especially when distracted/turning head.  Difficulty with negotiation of stairs.  Will continue PT in am - anticipate patient will be ready for d/c at that time.  Recommend OP PT for Vestibular and Balance Rehab.  Patient will need prescription (unless CM sets this up for patient).   Follow Up Recommendations  Outpatient PT;Supervision for mobility/OOB (For Vestibular Rehab)     Equipment Recommendations  Rolling walker with 5" wheels    Recommendations for Other Services       Precautions / Restrictions Precautions Precautions: Fall Precaution Comments: Dizziness and imbalance Restrictions Weight Bearing Restrictions: No    Mobility  Bed Mobility Overal bed mobility: Modified Independent             General bed mobility comments: No physical assist needed.  Patient continues to demonstrate improved sitting balance.  Transfers Overall transfer level: Needs assistance Equipment used: Rolling walker (2 wheeled) Transfers: Sit to/from Stand Sit to Stand: Min guard         General transfer comment: Instructed husband on proper guarding technique to assist patient with mobility/gait.  Had husband stay on patient's right side.  No physical assist needed to stand.  Continues to have good static standing balance with use of RW  Ambulation/Gait Ambulation/Gait assistance: Min assist Ambulation Distance (Feet): 110 Feet (with 1 seated rest break) Assistive device: Rolling walker (2  wheeled) Gait Pattern/deviations: Step-through pattern;Decreased stride length;Staggering right Gait velocity: Decreased Gait velocity interpretation: Below normal speed for age/gender General Gait Details: Had husband assist patient with ambulation, remaining on right side of patient in case of loss of balance.  Patient distracted by husband holding gait belt, turned her head and lost balance.  Patient also lost balance when trying to reach chair for rest break.  On both occasions, husband required assist by PT to prevent fall.  Had patient continue to use compensation technique, with visual focus.    Stairs Stairs: Yes Stairs assistance: Mod assist Stair Management: One rail Right;Step to pattern;Forwards Number of Stairs: 4 General stair comments: Verbal and visual instructions to negotiate stairs using rail on patient's right side, with step-to pattern.  Instructed husband on safe guarding technique.  Patient with difficulty on stairs, listing to right side again. Patient anxious, with difficulty focusing on visual target.  Cues to husband to remain close to patient on stairs due to her decreased balance.  PT assisting for safety.  Wheelchair Mobility    Modified Rankin (Stroke Patients Only) Modified Rankin (Stroke Patients Only) Pre-Morbid Rankin Score: No symptoms Modified Rankin: Moderately severe disability     Balance   Sitting-balance support: No upper extremity supported;Feet supported Sitting balance-Leahy Scale: Good     Standing balance support: Bilateral upper extremity supported Standing balance-Leahy Scale: Poor                      Cognition Arousal/Alertness: Awake/alert Behavior During Therapy: WFL for tasks assessed/performed Overall Cognitive Status: Within Functional Limits  for tasks assessed                      Exercises      General Comments        Pertinent Vitals/Pain Pain Assessment: 0-10 Pain Score: 9  Pain Location:  Headache Pain Descriptors / Indicators: Headache Pain Intervention(s): Monitored during session;Patient requesting pain meds-RN notified;Repositioned    Home Living                      Prior Function            PT Goals (current goals can now be found in the care plan section) Progress towards PT goals: Progressing toward goals    Frequency  Min 4X/week    PT Plan Current plan remains appropriate    Co-evaluation             End of Session Equipment Utilized During Treatment: Gait belt Activity Tolerance: Patient limited by fatigue;Patient limited by pain Patient left: in bed;with call bell/phone within reach;with family/visitor present     Time: 1535-1601 PT Time Calculation (min) (ACUTE ONLY): 26 min  Charges:  $Gait Training: 23-37 mins                    G Codes:      Vena Austria 11-24-14, 6:54 PM Durenda Hurt. Renaldo Fiddler, [facility] Acute Rehab Services Pager [phone]

## 2014-11-09 NOTE — Progress Notes (Signed)
STROKE TEAM PROGRESS NOTE   HISTORY Shelly Mcdaniel is a 59 y.o. female who reports that she was in her usual state of health for the majority of the day yesterday. She sat down to do some work on the computer at about 1800 and noted no problems. When she stood up about 2 hours later she was off balance and leaning to the right as she attempted to walk. The patient had no nausea or vomiting. Although she has had vertigo in the past, this episode seemed worse than usual. [facility] code stroke was called. NIHSS of 1.  Patient also reports pain in the right ear and pain around the right eye. She has had issues with ear infections in the right ear in the past.   Date last known well: Date: 11/07/2014 Time last known well: Time: 18:00 tPA Given: No: Patient refused   SUBJECTIVE (INTERVAL HISTORY) The patient's husband is at the bedside. The patient continues to improve.physical therapist evaluating patient feels she has positive horizontal canal vestibular dysfunction   OBJECTIVE Temp:  [97 F (36.1 C)-98.4 F (36.9 C)] 97.8 F (36.6 C) (05/29 1507) Pulse Rate:  [67-86] 78 (05/29 1507) Cardiac Rhythm:  [-]  Resp:  [18-20] 18 (05/29 1507) BP: (135-156)/(63-88) 152/84 mmHg (05/29 1507) SpO2:  [97 %-100 %] 97 % (05/29 1507)   Recent Labs Lab 11/08/14 1733 11/08/14 2146 11/08/14 2159 11/09/14 0644 11/09/14 1207  GLUCAP 335* 425* 363* 386* 314*    Recent Labs Lab 11/07/14 2156  NA 133*  K 3.8  CL 99*  CO2 23  GLUCOSE 508*  BUN 15  CREATININE 0.68  CALCIUM 9.3   No results for input(s): AST, ALT, ALKPHOS, BILITOT, PROT, ALBUMIN in the last 168 hours.  Recent Labs Lab 11/07/14 2156  WBC 5.6  HGB 13.7  HCT 39.9  MCV 86.2  PLT 192    Recent Labs Lab 11/08/14 0018  TROPONINI <0.03   No results for input(s): LABPROT, INR in the last 72 hours.  Recent Labs  11/08/14 0046  COLORURINE YELLOW  LABSPEC 1.043*  PHURINE  5.0  GLUCOSEU >1000*  HGBUR NEGATIVE  BILIRUBINUR NEGATIVE  KETONESUR NEGATIVE  PROTEINUR NEGATIVE  UROBILINOGEN 0.2  NITRITE NEGATIVE  LEUKOCYTESUR NEGATIVE       Component Value Date/Time   CHOL 177 11/08/2014 0527   TRIG 294* 11/08/2014 0527   HDL 28* 11/08/2014 0527   CHOLHDL 6.3 11/08/2014 0527   VLDL 59* 11/08/2014 0527   LDLCALC 90 11/08/2014 0527   Lab Results  Component Value Date   HGBA1C 10.9* 01/06/2011   No results found for: LABOPIA, COCAINSCRNUR, LABBENZ, AMPHETMU, THCU, LABBARB  No results for input(s): ETH in the last 168 hours.  Ct Head Wo Contrast 11/07/2014    No acute intracranial abnormality. No definite acute cortical infarction. Mild cerebral atrophy. Patchy subcortical and mild periventricular white matter decreased attenuation probable due to chronic small vessel ischemic changes. Question small lacunar infarct in left internal capsule posterior limb.   Mri Brain Without Contrast 11/08/2014     MRI HEAD IMPRESSION:   1. No acute intracranial infarct or other abnormality identified.  2. Atrophy with moderate chronic microvascular ischemic disease involving the supratentorial white matter.  3. 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     MRA HEAD IMPRESSION:   1. No proximal branch occlusion identified within the intracranial circulation.  2. Atheromatous irregularity throughout the basilar artery with secondary a  moderate multi focal narrowing. Basilar artery supplied via the dominant left vertebral artery which is widely patent. The diminutive right vertebral artery terminates in PICA.  3. Multi focal atheromatous irregularity with mild stenoses within the left M1 segment.  4. Multifocal atheromatous irregularity within the cavernous carotid arteries, left greater than right.  5. Fetal origin of the left PCA.      PHYSICAL EXAM Mental Status: Alert, oriented, thought content appropriate. Speech fluent without evidence of  aphasia. Able to follow 3 step commands without difficulty. Cranial Nerves: II: Visual fields grossly normal, pupils equal, round, reactive to light and accommodation III,IV, VI: ptosis not present, extra-ocular motions intact bilaterally V,VII: smile symmetric, facial light touch sensation normal bilaterally VIII: hearing normal bilaterally IX,X: gag reflex present XI: bilateral shoulder shrug XII: midline tongue extension Motor: Right :Upper extremity 5/5Left: Upper extremity 5/5 Lower extremity 5/5Lower extremity 5/5 Tone and bulk:normal tone throughout; no atrophy noted Sensory: Light touch intact throughout, bilaterally Deep Tendon Reflexes: 2+ throughout with an absent left AJ Plantars: Right: downgoingLeft: downgoing Cerebellar: normal finger-to-nose bilaterally Gait: Deferred       ASSESSMENT/PLAN [name] is a 59 y.o. female with history of diabetes mellitus, hypertension, and hyperlipidemia, presenting with gait ataxia. She did not receive IV t-PA due to patient declined.   Possible posterior circulationTIA vs smal infarct not seen onMRI. Peripheral vestibular dysfunction may also be contributing  Resultant resolution of deficits  MRI  no acute infarct  MRA  no high-grade stenosis  Carotid Doppler  1-39% internal carotid artery stenosis bilaterally. Vertebral arteries are patent with antegrade flow.  2D Echo  pending  LDL 90  HgbA1c pending  SCDs for VTE prophylaxis Diet heart healthy/carb modified Room service appropriate?: Yes; Fluid consistency:: Thin  no antithrombotic prior to admission, now on aspirin 325 mg orally every day  Patient counseled to be compliant with her antithrombotic medications  Ongoing aggressive stroke risk factor management  Therapy recommendations:  Outpatient physical  therapy recommended.  Disposition: Pending  Hypertension  Home meds: Accuretic  Stable   Hyperlipidemia  Home meds:  Zocor resumed in hospital  LDL 90 goal < 70  Change to Lipitor  Continue statin at discharge  Diabetes  HgbA1c pending, goal < 7.0  Uncontrolled  Other Stroke Risk Factors  Obesity, Body mass index is 28.73 kg/(m^2).   Hx stroke/TIA  Family hx stroke (brother)   Other Active Problems    Other Pertinent History  Remote lacunar infarcts by MRI  Plan discharge once workup is complete.  Hospital day #   Delton See PA-C [facility] Pager [phone] 11/09/2014, 5:08 PM I have personally examined this patient, reviewed notes, independently viewed imaging studies, participated in medical decision making and plan of care. I have made any additions or clarifications directly to the above note. Agree with note above.  She remains at risk for neurological worsening, recurrent stroke/TIA and needs on going stroke evaluation and agrressive riskk factor modification.Continue aspirin and physical therapy for vestibular rehab and habituation exercises  Delia Heady, MD Medical Director [facility] Pager: [phone] 11/09/2014 5:33 PM     To contact Stroke Continuity provider, please refer to [email or web address]. After hours, contact General Neurology

## 2014-11-09 NOTE — Progress Notes (Signed)
Physical Therapy Treatment Patient Details Name: MCKENSEY BERGHUIS MRN: [id number] DOB: 16-Apr-1956 Today's Date: 11/09/2014    History of Present Illness Patient is a 59 yo female admitted 11/07/14 with dizziness and imbalance.  Work-up underway for TIA, with NIHSS score of 1.  PMH:  DM, HTN, HLD.  Patient also reports that she has had vertigo and BPPV in the past - 2 years ago.    PT Comments    Patient making improvements with decreased dizziness and increased mobility and gait.  Patient reports spinning greatly reduced.  Able to sit EOB with no loss of balance.  Performed [employer] - no nystagmus noted.  Head thrust negative to both sides.  Patient able to perform x1 exercises horizontally and vertically with no increase in dizziness.  Patient able to perform ambulation today with RW and min assist.  With head midline, patient able to maintain balance during gait.  With head turn to left, patient loses balance to right.   Follow Up Recommendations  Outpatient PT;Supervision for mobility/OOB (for Vestibular Rehab)     Equipment Recommendations  Rolling walker with 5" wheels    Recommendations for Other Services       Precautions / Restrictions Precautions Precautions: Fall Precaution Comments: Dizziness and imbalance Restrictions Weight Bearing Restrictions: No    Mobility  Bed Mobility Overal bed mobility: Modified Independent Bed Mobility: Supine to Sit;Sit to Supine     Supine to sit: Modified independent (Device/Increase time) Sit to supine: Modified independent (Device/Increase time)   General bed mobility comments: Increased time.  Encouraged patient to move slowly.  Much improved sitting balance.  Patient reports objects/room is not spinning today.  No assist needed for balance in sitting.  Did perform Lt Dix-Hallpike with no nystagmus today.  Transfers Overall transfer level: Needs assistance Equipment used: Rolling walker (2 wheeled) Transfers: Sit to/from  Stand Sit to Stand: Min guard         General transfer comment: Much improved static standing balance.    Ambulation/Gait Ambulation/Gait assistance: Min assist Ambulation Distance (Feet): 84 Feet (20' and 88' with seated rest) Assistive device: Rolling walker (2 wheeled) Gait Pattern/deviations: Step-through pattern;Decreased stride length Gait velocity: Decreased Gait velocity interpretation: Below normal speed for age/gender General Gait Details: Verbal cues for safe use of RW, and use of visual target.  Encouraged patient to move at a slower, safe pace.  Patient able to maintain balance during gait when keeping her head still and moving in straight line.  Patient turned head to left and listed to right, requiring assist for balance.  Also noted patient leaning to right when she made sharp turns to left.  Had patient turn to right when possible for safety when home with husband.   Stairs            Wheelchair Mobility    Modified Rankin (Stroke Patients Only) Modified Rankin (Stroke Patients Only) Pre-Morbid Rankin Score: No symptoms Modified Rankin: Moderately severe disability     Balance Overall balance assessment: Needs assistance Sitting-balance support: No upper extremity supported;Feet supported Sitting balance-Leahy Scale: Good Sitting balance - Comments: progressing to good   Standing balance support: Bilateral upper extremity supported Standing balance-Leahy Scale: Poor                      Cognition Arousal/Alertness: Awake/alert Behavior During Therapy: WFL for tasks assessed/performed Overall Cognitive Status: Within Functional Limits for tasks assessed  Exercises      General Comments        Pertinent Vitals/Pain Pain Assessment: 0-10 Pain Score: 6  Pain Location: Headache Pain Descriptors / Indicators: Headache Pain Intervention(s): Monitored during session;Patient requesting pain meds-RN notified     Home Living Family/patient expects to be discharged to:: Private residence Living Arrangements: Spouse/significant other Available Help at Discharge: Family Type of Home: House Home Access: Stairs to enter Entrance Stairs-Rails: None Home Layout: Able to live on main level with bedroom/bathroom Home Equipment: None      Prior Function Level of Independence: Independent          PT Goals (current goals can now be found in the care plan section) Acute Rehab PT Goals Patient Stated Goal: to get over this sooner than later Progress towards PT goals: Progressing toward goals    Frequency  Min 4X/week    PT Plan Current plan remains appropriate    Co-evaluation             End of Session Equipment Utilized During Treatment: Gait belt Activity Tolerance: Patient tolerated treatment well Patient left: in chair;with call bell/phone within reach;with family/visitor present     Time: [id number] PT Time Calculation (min) (ACUTE ONLY): 42 min  Charges:  $Gait Training: 23-37 mins $Therapeutic Activity: 8-22 mins                    G Codes:      Vena AustriaDavis, Shadiamond Koska H 11/09/2014, 6:18 PM Durenda HurtSusan H. Renaldo Fiddleravis, PT, [facility] Acute Rehab Services Pager [phone]

## 2014-11-09 NOTE — Evaluation (Signed)
Occupational Therapy Evaluation and Discharge Patient Details Name: Shelly Mcdaniel MRN: [id number] DOB: 02-12-1956 Today's Date: 11/09/2014    History of Present Illness Patient is a 59 yo female admitted 11/07/14 with dizziness and imbalance.  Work-up underway for TIA, with NIHSS score of 1.  PMH:  DM, HTN, HLD.  Patient also reports that she has had vertigo and BPPV in the past - 2 years ago.   Clinical Impression   This 59 yo female admitted with above presents to acute OT with all education completed from a BADL standpoint, we will D/C from acute OT.    Follow Up Recommendations  No OT follow up    Equipment Recommendations  None recommended by OT       Precautions / Restrictions Precautions Precautions: Fall Precaution Comments: Dizziness and imbalance Restrictions Weight Bearing Restrictions: No      Mobility Bed Mobility Overal bed mobility: Modified Independent Bed Mobility: Supine to Sit     Supine to sit: Modified independent (Device/Increase time) (using a focus point)        Transfers Overall transfer level: Modified independent Equipment used: Rolling walker (2 wheeled) Transfers: Sit to/from Stand Sit to Stand: Min guard (using a focus point)              Balance Overall balance assessment: Needs assistance Sitting-balance support: No upper extremity supported Sitting balance-Leahy Scale: Fair Sitting balance - Comments: progressing to good   Standing balance support: Bilateral upper extremity supported Standing balance-Leahy Scale: Poor (progressing to fair as long as she does not turn to her left then she started listing right)                              ADL                                         General ADL Comments: Advised pt to sit on seat to shower or sit down in tub and take a bath (not to stand right now until her dizziness gets better). Also advised her to sit down to get dressed. When getting from  supine to sit and sit to stand educated her on finding a focus point and then moving.      Vision Additional Comments: No change          Pertinent Vitals/Pain Pain Assessment: 0-10 Pain Score: 10-Worst pain ever Pain Location: headache behind right eye Pain Descriptors / Indicators: Aching;Headache Pain Intervention(s): Monitored during session;Repositioned;Ice applied;Patient requesting pain meds-RN notified     Hand Dominance Right   Extremity/Trunk Assessment Upper Extremity Assessment Upper Extremity Assessment: Overall WFL for tasks assessed           Communication Communication Communication: No difficulties   Cognition Arousal/Alertness: Awake/alert Behavior During Therapy: WFL for tasks assessed/performed Overall Cognitive Status: Within Functional Limits for tasks assessed                                Home Living Family/patient expects to be discharged to:: Private residence Living Arrangements: Spouse/significant other Available Help at Discharge: Family Type of Home: House Home Access: Stairs to enter Secretary/administrator of Steps: 5 Entrance Stairs-Rails: None Home Layout: Able to live on main level with bedroom/bathroom     Bathroom Shower/Tub: Tub/shower unit;Curtain  Shower/tub characteristics: Teacher, early years/preCurtain Bathroom Toilet: Standard     Home Equipment: None          Prior Functioning/Environment Level of Independence: Independent             OT Diagnosis: Generalized weakness;Acute pain         OT Goals(Current goals can be found in the care plan section) Acute Rehab OT Goals Patient Stated Goal: to get over this sooner than later  OT Frequency:                End of Session Nurse Communication: Patient requests pain meds  Activity Tolerance: Patient tolerated treatment well Patient left: in bed;with call bell/phone within reach;with family/visitor present   Time: 1354-1416 OT Time Calculation (min): 22  min Charges:  OT General Charges $OT Visit: 1 Procedure OT Evaluation $Initial OT Evaluation Tier I: 1 Procedure G-Codes: OT G-codes **NOT FOR INPATIENT CLASS** Functional Assessment Tool Used: Clinical observation Functional Limitation: Self care Self Care Current Status (Z6109(G8987): At least 1 percent but less than 20 percent impaired, limited or restricted Self Care Goal Status (U0454(G8988): At least 1 percent but less than 20 percent impaired, limited or restricted Self Care Discharge Status [phone](G8989): At least 1 percent but less than 20 percent impaired, limited or restricted  Evette GeorgesLeonard, Londen Bok Eva [phone] 11/09/2014, 2:37 PM

## 2014-11-09 NOTE — Progress Notes (Signed)
  Echocardiogram 2D Echocardiogram has been performed.  Arvil ChacoFoster, Greyson Peavy 11/09/2014, 2:52 PM

## 2014-11-09 NOTE — Progress Notes (Addendum)
[facility] PROGRESS NOTE  Shelly Mcdaniel VHQ:[id number] DOB: September 07, 1955 DOA: 11/07/2014 PCP: Ginette Otto, MD  Assessment/Plan: 59 y/o female with PMH of HTN, DM, HPL presented with dizziness, mostly positional   1. Dizziness. Suspected BPPV. "+" dix haulpike. Neuro exam is non focal. MRI head: no acute findings. 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 Carotid  US: unremarkable. LDL-90. hDL-28. Echo:-pend. ha1c-pend   -Clinically improving on OT eval/epley.  2. DM. Uncontrolled. Pt reports last ha1c>10. Resumed lantus at 22, added aspart 3 unit TID+ISS. pend ha1c 3. HTN. Resume home regimen  ACE. Hold hctz   D/c plans: possible to day. Pend PT eval. Neurology recs   Code Status: full Family Communication: d/w patient, her husband, family at the bedside  (indicate person spoken with, relationship, and if by phone, the number) Disposition Plan: likely today    Consultants:  Neuro   Procedures:  echo  Antibiotics:  none (indicate start date, and stop date if known)  HPI/Subjective: Alert   Objective: Filed Vitals:   11/09/14 0947  BP: 146/83  Pulse: 81  Temp: 97.5 F (36.4 C)  Resp: 18    Intake/Output Summary (Last 24 hours) at 11/09/14 1038 Last data filed at 11/09/14 0519  Gross per 24 hour  Intake    240 ml  Output      0 ml  Net    240 ml   Filed Weights   11/07/14 2106 11/08/14 0100  Weight: 82.101 kg (181 lb) 85.7 kg (188 lb 15 oz)    Exam:   General:  Alert, no distress   Cardiovascular: s1,s2 rrr  Respiratory: CTA BL   Abdomen: soft, nt,nd   Musculoskeletal: no leg edema   Data Reviewed: Basic Metabolic Panel:  Recent Labs Lab 11/07/14 2156  NA 133*  K 3.8  CL 99*  CO2 23  GLUCOSE 508*  BUN 15  CREATININE 0.68  CALCIUM 9.3   Liver Function Tests: No results for input(s): AST, ALT, ALKPHOS, BILITOT, PROT, ALBUMIN in the last 168 hours. No results for input(s): LIPASE, AMYLASE in the last  168 hours. No results for input(s): AMMONIA in the last 168 hours. CBC:  Recent Labs Lab 11/07/14 2156  WBC 5.6  HGB 13.7  HCT 39.9  MCV 86.2  PLT 192   Cardiac Enzymes:  Recent Labs Lab 11/08/14 0018  TROPONINI <0.03   BNP (last 3 results) No results for input(s): BNP in the last 8760 hours.  ProBNP (last 3 results) No results for input(s): PROBNP in the last 8760 hours.  CBG:  Recent Labs Lab 11/08/14 1231 11/08/14 1733 11/08/14 2146 11/08/14 2159 11/09/14 0644  GLUCAP 359* 335* 425* 363* 386*    No results found for this or any previous visit (from the past 240 hour(s)).   Studies: Ct Head Wo Contrast  11/07/2014   CLINICAL DATA:  Headache, dizziness starting today  EXAM: CT HEAD WITHOUT CONTRAST  TECHNIQUE: Contiguous axial images were obtained from the base of the skull through the vertex without intravenous contrast.  COMPARISON:  None.  FINDINGS: No skull fracture is noted. Paranasal sinuses and mastoid air cells are unremarkable.  No intracranial hemorrhage, mass effect or midline shift. There is mild cerebral atrophy. There is mild periventricular and patchy subcortical white matter decreased attenuation probable due to chronic small vessel ischemic changes. Question small lacunar infarct in left posterior limb of internal capsule. See axial image 16. No definite evidence of acute cortical infarction. No mass lesion  is noted on this unenhanced scan.  IMPRESSION: No acute intracranial abnormality. No definite acute cortical infarction. Mild cerebral atrophy. Patchy subcortical and mild periventricular white matter decreased attenuation probable due to chronic small vessel ischemic changes. Question small lacunar infarct in left internal capsule posterior limb.   Electronically Signed   By: Natasha Mead M.D.   On: 11/07/2014 22:35   Mri Brain Without Contrast  11/08/2014   CLINICAL DATA:  Initial evaluation for gait ataxia.  EXAM: MRI HEAD WITHOUT CONTRAST  MRA HEAD  WITHOUT CONTRAST  TECHNIQUE: Multiplanar, multiecho pulse sequences of the brain and surrounding structures were obtained without intravenous contrast. Angiographic images of the head were obtained using MRA technique without contrast.  COMPARISON:  Prior CT from 11/07/2014  FINDINGS: MRI HEAD FINDINGS  Mild diffuse prominence of the CSF containing spaces is compatible with generalized age-related cerebral atrophy. Patchy and confluent T2/FLAIR hyperintensity within the periventricular and deep white matter both cerebral hemispheres consistent with moderate chronic small vessel ischemic disease. Remote lacunar infarcts present within the left basal ganglia.  No abnormal foci of restricted diffusion to suggest acute intracranial infarct. Gray-white matter differentiation maintained. Normal intravascular flow voids preserved. No acute or chronic intracranial hemorrhage.  No mass lesion or midline shift. No hydrocephalus. No extra-axial fluid collection.  Craniocervical junction within normal limits. Degenerative changes present within the visualized upper cervical spine. Question compression deformity of the C4 vertebral body. Pituitary gland normal.  No acute abnormality about the orbits.  Paranasal sinuses are clear. No mastoid effusion. Inner ear structures normal.  Bone marrow signal intensity within normal limits.  MRA HEAD FINDINGS  ANTERIOR CIRCULATION:  Visualized distal cervical segments of the internal carotid arteries are widely patent with antegrade flow. The petrous segments are widely patent. Multi focal atheromatous irregularity present within the cavernous segments of the internal carotid arteries with associated mild to moderate multi focal stenoses, left worse than right. Mild narrowing of the supra clinoid left ICA. A1 segments mildly irregular but patent without focal high-grade stenosis. Anterior communicating artery grossly normal. Anterior cerebral arteries well opacified.  Multi focal  atheromatous irregularity present within the M1 segments bilaterally, left greater than right. There are multi focal mild stenoses within the left M1 segment. Distal MCA branches are fairly symmetric.  POSTERIOR CIRCULATION:  Right vertebral artery is diminutive and appears to terminate in the right posterior communicating artery. Dominant left vertebral artery widely patent to the vertebrobasilar junction. The left posterior inferior cerebellar arteries patent. There is moderate atheromatous irregularity throughout the basilar artery, greatest within its mid and distal aspect. Associated moderate narrowing. The superior cerebellar arteries are patent bilaterally. There is fetal origin of the left posterior cerebral artery with widely patent left posterior communicating artery. Left PCA well opacified to its distal branches. Right P1 and P2 segments widely patent.  No aneurysm.  IMPRESSION: MRI HEAD IMPRESSION:  1. No acute intracranial infarct or other abnormality identified. 2. Atrophy with moderate chronic microvascular ischemic disease involving the supratentorial white matter. 3. 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  MRA HEAD IMPRESSION:  1. No proximal branch occlusion identified within the intracranial circulation. 2. Atheromatous irregularity throughout the basilar artery with secondary a moderate multi focal narrowing. Basilar artery supplied via the dominant left vertebral artery which is widely patent. The diminutive right vertebral artery terminates in PICA. 3. Multi focal atheromatous irregularity with mild stenoses within the left M1 segment. 4. Multifocal atheromatous irregularity within the cavernous carotid arteries, left greater  than right. 5. Fetal origin of the left PCA.   Electronically Signed   By: Rise MuBenjamin  McClintock M.D.   On: 11/08/2014 08:21   Mr Maxine GlennMra Head/brain Wo Cm  11/08/2014   CLINICAL DATA:  Initial evaluation for gait ataxia.  EXAM: MRI HEAD WITHOUT  CONTRAST  MRA HEAD WITHOUT CONTRAST  TECHNIQUE: Multiplanar, multiecho pulse sequences of the brain and surrounding structures were obtained without intravenous contrast. Angiographic images of the head were obtained using MRA technique without contrast.  COMPARISON:  Prior CT from 11/07/2014  FINDINGS: MRI HEAD FINDINGS  Mild diffuse prominence of the CSF containing spaces is compatible with generalized age-related cerebral atrophy. Patchy and confluent T2/FLAIR hyperintensity within the periventricular and deep white matter both cerebral hemispheres consistent with moderate chronic small vessel ischemic disease. Remote lacunar infarcts present within the left basal ganglia.  No abnormal foci of restricted diffusion to suggest acute intracranial infarct. Gray-white matter differentiation maintained. Normal intravascular flow voids preserved. No acute or chronic intracranial hemorrhage.  No mass lesion or midline shift. No hydrocephalus. No extra-axial fluid collection.  Craniocervical junction within normal limits. Degenerative changes present within the visualized upper cervical spine. Question compression deformity of the C4 vertebral body. Pituitary gland normal.  No acute abnormality about the orbits.  Paranasal sinuses are clear. No mastoid effusion. Inner ear structures normal.  Bone marrow signal intensity within normal limits.  MRA HEAD FINDINGS  ANTERIOR CIRCULATION:  Visualized distal cervical segments of the internal carotid arteries are widely patent with antegrade flow. The petrous segments are widely patent. Multi focal atheromatous irregularity present within the cavernous segments of the internal carotid arteries with associated mild to moderate multi focal stenoses, left worse than right. Mild narrowing of the supra clinoid left ICA. A1 segments mildly irregular but patent without focal high-grade stenosis. Anterior communicating artery grossly normal. Anterior cerebral arteries well opacified.   Multi focal atheromatous irregularity present within the M1 segments bilaterally, left greater than right. There are multi focal mild stenoses within the left M1 segment. Distal MCA branches are fairly symmetric.  POSTERIOR CIRCULATION:  Right vertebral artery is diminutive and appears to terminate in the right posterior communicating artery. Dominant left vertebral artery widely patent to the vertebrobasilar junction. The left posterior inferior cerebellar arteries patent. There is moderate atheromatous irregularity throughout the basilar artery, greatest within its mid and distal aspect. Associated moderate narrowing. The superior cerebellar arteries are patent bilaterally. There is fetal origin of the left posterior cerebral artery with widely patent left posterior communicating artery. Left PCA well opacified to its distal branches. Right P1 and P2 segments widely patent.  No aneurysm.  IMPRESSION: MRI HEAD IMPRESSION:  1. No acute intracranial infarct or other abnormality identified. 2. Atrophy with moderate chronic microvascular ischemic disease involving the supratentorial white matter. 3. 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  MRA HEAD IMPRESSION:  1. No proximal branch occlusion identified within the intracranial circulation. 2. Atheromatous irregularity throughout the basilar artery with secondary a moderate multi focal narrowing. Basilar artery supplied via the dominant left vertebral artery which is widely patent. The diminutive right vertebral artery terminates in PICA. 3. Multi focal atheromatous irregularity with mild stenoses within the left M1 segment. 4. Multifocal atheromatous irregularity within the cavernous carotid arteries, left greater than right. 5. Fetal origin of the left PCA.   Electronically Signed   By: Rise MuBenjamin  McClintock M.D.   On: 11/08/2014 08:21    Scheduled Meds: . ALPRAZolam  0.5 mg Oral Once  .  aspirin  325 mg Oral Daily  . atorvastatin  80 mg  Oral q1800  . gabapentin  300 mg Oral QHS  . insulin aspart  0-5 Units Subcutaneous QHS  . insulin aspart  0-9 Units Subcutaneous TID WC  . insulin aspart  3 Units Subcutaneous TID WC  . insulin detemir  22 Units Subcutaneous QPM  . lisinopril  5 mg Oral Daily  . sertraline  100 mg Oral QHS   Continuous Infusions:   Active Problems:   Ataxia   TIA (transient ischemic attack)   Hypertension   DM II (diabetes mellitus, type II), controlled    Time spent: >35 minutes     Esperanza Sheets  [facility] Pager [phone]. If 7PM-7AM, please contact night-coverage at [email or web address], password [facility] 11/09/2014, 10:38 AM

## 2014-11-09 NOTE — Progress Notes (Signed)
Patient is still unsteady, unable to perform stairs safely per physical therapy evaluation. Requested to cont monitor for 24 hours per patient. Cont PT/OT. Fall precautions   Ling Flesch N

## 2014-11-10 DIAGNOSIS — E119 Type 2 diabetes mellitus without complications: Secondary | ICD-10-CM

## 2014-11-10 DIAGNOSIS — G459 Transient cerebral ischemic attack, unspecified: Secondary | ICD-10-CM | POA: Diagnosis not present

## 2014-11-10 DIAGNOSIS — R270 Ataxia, unspecified: Secondary | ICD-10-CM

## 2014-11-10 DIAGNOSIS — I10 Essential (primary) hypertension: Secondary | ICD-10-CM

## 2014-11-10 LAB — GLUCOSE, CAPILLARY
Glucose-Capillary: 225 mg/dL — ABNORMAL HIGH (ref 65–99)
Glucose-Capillary: 255 mg/dL — ABNORMAL HIGH (ref 65–99)

## 2014-11-10 MED ORDER — MECLIZINE HCL 25 MG PO TABS: 25.0000 mg | ORAL_TABLET | Freq: Three times a day (TID) | ORAL | Status: DC | PRN

## 2014-11-10 MED ORDER — LORATADINE 10 MG PO TABS: 10.0000 mg | ORAL_TABLET | Freq: Every day | ORAL | Status: DC | PRN

## 2014-11-10 MED ORDER — ASPIRIN EC 81 MG PO TBEC: 81.0000 mg | DELAYED_RELEASE_TABLET | Freq: Every day | ORAL | Status: DC

## 2014-11-10 MED ORDER — CIPROFLOXACIN HCL 0.3 % OP SOLN: 1.0000 [drp] | OPHTHALMIC | Status: DC

## 2014-11-10 MED ORDER — INSULIN DETEMIR 100 UNIT/ML ~~LOC~~ SOLN
25.0000 [IU] | Freq: Every evening | SUBCUTANEOUS | Status: DC
  Filled 2014-11-10: qty 0.25

## 2014-11-10 MED ORDER — CEFUROXIME AXETIL 500 MG PO TABS
500.0000 mg | ORAL_TABLET | Freq: Two times a day (BID) | ORAL | Status: DC
  Administered 2014-11-10: 500 mg via ORAL
  Filled 2014-11-10: qty 1

## 2014-11-10 MED ORDER — LORATADINE 10 MG PO TABS
10.0000 mg | ORAL_TABLET | Freq: Every day | ORAL | Status: DC
  Administered 2014-11-10: 10 mg via ORAL
  Filled 2014-11-10: qty 1

## 2014-11-10 MED ORDER — INSULIN ASPART 100 UNIT/ML ~~LOC~~ SOLN
5.0000 [IU] | Freq: Three times a day (TID) | SUBCUTANEOUS | Status: DC
  Administered 2014-11-10 (×2): 5 [IU] via SUBCUTANEOUS

## 2014-11-10 MED ORDER — UNABLE TO FIND: Status: DC

## 2014-11-10 MED ORDER — INSULIN ASPART 100 UNIT/ML FLEXPEN: 5.0000 [IU] | PEN_INJECTOR | Freq: Three times a day (TID) | SUBCUTANEOUS | Status: DC

## 2014-11-10 MED ORDER — CEFUROXIME AXETIL 500 MG PO TABS: 500.0000 mg | ORAL_TABLET | Freq: Two times a day (BID) | ORAL | Status: DC

## 2014-11-10 NOTE — Progress Notes (Signed)
Physical Therapy Treatment Patient Details Name: Shelly Mcdaniel MRN: [id number] DOB: 05/20/1956 Today's Date: 11/10/2014    History of Present Illness Patient is a 59 yo female admitted 11/07/14 with dizziness and imbalance.  Work-up underway for TIA, with NIHSS score of 1.  PMH:  DM, HTN, HLD.  Patient also reports that she has had vertigo and BPPV in the past - 2 years ago.    PT Comments    Patient with decreased dizziness today.  Did continue to have balance difficulties.  Improved gait and stair negotiation.  Husband able to safely assist patient.  Patient ready for d/c.  To have f/u OP PT for Vestibular and Balance Rehab.   Follow Up Recommendations  Outpatient PT;Supervision/Assistance - 24 hour (for Vestibular Rehab)     Equipment Recommendations  Rolling walker with 5" wheels    Recommendations for Other Services       Precautions / Restrictions Precautions Precautions: Fall Precaution Comments: Dizziness and imbalance Restrictions Weight Bearing Restrictions: No    Mobility  Bed Mobility Overal bed mobility: Modified Independent Bed Mobility: Supine to Sit;Sit to Supine     Supine to sit: Modified independent (Device/Increase time) Sit to supine: Modified independent (Device/Increase time)   General bed mobility comments: Encouraged patient to move slowly for safety.  Transfers Overall transfer level: Needs assistance Equipment used: Rolling walker (2 wheeled) Transfers: Sit to/from Stand Sit to Stand: Supervision         General transfer comment: Assist for safety only.  Patient with improved standing static balance.  Ambulation/Gait Ambulation/Gait assistance: Min assist Ambulation Distance (Feet): 200 Feet Assistive device: Rolling walker (2 wheeled) Gait Pattern/deviations: Step-through pattern;Staggering right;Drifts right/left Gait velocity: Decreased Gait velocity interpretation: Below normal speed for age/gender General Gait Details: Husband  assisting patient.  Patient continued to drift to right slightly.  Minimal listing to right - husband able to assist safely to prevent fall.  Cues to use visual target to improve balance/gait.  Improved activity tolerance.  Cues to move more slowly for safety, especially with turns.   Stairs Stairs: Yes Stairs assistance: Min assist Stair Management: One rail Right;Step to pattern;Forwards Number of Stairs: 5 General stair comments: Husband assisting patient on stairs with PT supervision.  Cues to move more slowly and to use visual target.  Less leaning to right today.  Cues for husband to remain close to patient during stair descent.  Improved stair negotiation today.  Wheelchair Mobility    Modified Rankin (Stroke Patients Only) Modified Rankin (Stroke Patients Only) Pre-Morbid Rankin Score: No symptoms Modified Rankin: Moderately severe disability     Balance           Standing balance support: Single extremity supported Standing balance-Leahy Scale: Poor Standing balance comment: Needs UE support to maintain balance.                    Cognition Arousal/Alertness: Awake/alert ("sleepy") Behavior During Therapy: WFL for tasks assessed/performed Overall Cognitive Status: Within Functional Limits for tasks assessed                      Exercises      General Comments        Pertinent Vitals/Pain Pain Assessment: No/denies pain    Home Living                      Prior Function            PT Goals (  current goals can now be found in the care plan section) Progress towards PT goals: Progressing toward goals    Frequency  Min 4X/week    PT Plan Current plan remains appropriate    Co-evaluation             End of Session Equipment Utilized During Treatment: Gait belt Activity Tolerance: Patient tolerated treatment well Patient left: in bed;with call bell/phone within reach;with family/visitor present     Time:  1610-9604 PT Time Calculation (min) (ACUTE ONLY): 24 min  Charges:  $Gait Training: 23-37 mins                    G Codes:  Functional Assessment Tool Used: Clinical judgement Functional Limitation: Mobility: Walking and moving around Mobility: Walking and Moving Around Goal Status [phone]): At least 1 percent but less than 20 percent impaired, limited or restricted Mobility: Walking and Moving Around Discharge Status [phone]): At least 1 percent but less than 20 percent impaired, limited or restricted   Vena Austria 11/10/2014, 11:23 AM Durenda Hurt. Renaldo Fiddler, [facility] Pager [phone]

## 2014-11-10 NOTE — Discharge Summary (Signed)
Physician Discharge Summary   Patient ID: Shelly Mcdaniel MRN: [id number] DOB/AGE: 09-24-1955 59 y.o.  Admit date: 11/07/2014 Discharge date: 11/10/2014  Primary Care Physician:  Ginette Otto, MD  Discharge Diagnoses:     . probable posterior circulation TIA (transient ischemic attack)   vertigo    sinusitis  . Hypertension  Consults:  Neurology, Dr Pearlean Brownie     Recommendations for Outpatient Follow-up:  Patient was given prescription for outpatient physical therapy for vestibular rehabilitation  May need ENT referral if continues to have similar symptoms  TESTS THAT NEED FOLLOW-UP None    DIET: Card modified    Allergies:   Allergies  Allergen Reactions  . Bee Venom Anaphylaxis  . Codeine Itching  . Lipitor [Atorvastatin] Other (See Comments)    Cramping   . Septra [Sulfamethoxazole-Trimethoprim]     Mother died from Stevens-Johnson syndrome and sister had anaphylaxis from Septra.Physician told her never to take this medication.     Discharge Medications:   Medication List    TAKE these medications        acetaminophen 500 MG tablet  Commonly known as:  TYLENOL  Take 500 mg by mouth every 6 (six) hours as needed for moderate pain (headache).     aspirin EC 81 MG tablet  Take 1 tablet (81 mg total) by mouth daily.     cefUROXime 500 MG tablet  Commonly known as:  CEFTIN  Take 1 tablet (500 mg total) by mouth 2 (two) times daily with a meal. X 7days     ciprofloxacin 0.3 % ophthalmic solution  Commonly known as:  CILOXAN  Place 1 drop into the right eye every 2 (two) hours while awake. Administer 1 drop, every 2 hours, while awake, for 2 days. Then 1 drop, every 4 hours, while awake, for the next 5 days.     gabapentin 300 MG capsule  Commonly known as:  NEURONTIN  Take 300 mg by mouth at bedtime.     ibuprofen 200 MG tablet  Commonly known as:  ADVIL,MOTRIN  Take 400 mg by mouth every 6 (six) hours as needed for headache (headache).      insulin aspart 100 UNIT/ML FlexPen  Commonly known as:  NOVOLOG FLEXPEN  Inject 5 Units into the skin 3 (three) times daily with meals.     LEVEMIR FLEXTOUCH 100 UNIT/ML Pen  Generic drug:  Insulin Detemir  Inject 18-22 Units into the skin every evening.     loratadine 10 MG tablet  Commonly known as:  CLARITIN  Take 1 tablet (10 mg total) by mouth daily as needed for allergies (also available OTC).     meclizine 25 MG tablet  Commonly known as:  ANTIVERT  Take 1 tablet (25 mg total) by mouth 3 (three) times daily as needed for dizziness (also available OTC).     metFORMIN 500 MG 24 hr tablet  Commonly known as:  GLUCOPHAGE-XR  Take 2,000 mg by mouth at bedtime.     quinapril-hydrochlorothiazide 20-25 MG per tablet  Commonly known as:  ACCURETIC  Take 1 tablet by mouth at bedtime.     sertraline 100 MG tablet  Commonly known as:  ZOLOFT  Take 100 mg by mouth at bedtime.     simvastatin 40 MG tablet  Commonly known as:  ZOCOR  Take 40 mg by mouth at bedtime.         Brief H and P: For complete details please refer to admission H and P, but in  briefpatient is a 59 year old female with diabetes, hypertension, hyperlipidemia presented with dizziness and lightheadedness on the day of admission at 3:30 PM while getting a pedicure. Patient reported significant frontal headache, took some ibuprofen and Tylenol. She subsequently she took a nap. After that she endorsed some spinning sensation when she got up, unsteady on her gait. Patient was brought to ED. Also complained of right-sided facial pressure it to be due to sinuses and earache. Neurology was consulted. She was not considered a TPA candidate as patient was 4 hours out from onset. CT scan of the head showed no acute intracranial abnormalities. Patient was admitted for further workup.   Hospital Course:     Ataxia possibly posterior circulation TIA versus benign paroxysmal positional vertigo Patient was admitted for further  workup. Neurology  was consulted. She was not considered a TPA candidate due to delayed presentation. MRI of the brain was obtained which showed no acute findings, small remote lacunar infarcts involving the left basal ganglia. Carotid Dopplers showed 1-39% ICA stenosis. Patient was started on aspirin 81 mg daily PTOT evaluation was done which showed BPPV, she was placed on meclizine with significant improvement on her symptoms. 2-D echo showed mild LVH, otherwise normal, EF 60-65%, grade 1 diastolic dysfunction Patient was also provided with a rolling walker  Vertigo possibly due to sinusitis Patient was also given prescription for Ceftin for 7 days with Claritin daily    Hypertension Currently stable    DM II (diabetes mellitus, type II), uncontrolled Patient had presented with CBG in 500s, patient was also given the prescription for short-acting NovoLog, 5 units with meals, continue Levemir and metformin. Please follow CBGs closely and may need further adjustment of the insulin regimen.     Day of Discharge BP 133/72 mmHg  Pulse 80  Temp(Src) 98.6 F (37 C) (Oral)  Resp 18  Ht  (1.727 m)  Wt 85.7 kg (188 lb 15 oz)  BMI 28.73 kg/m2  SpO2 100%  Physical Exam: General: Alert and awake oriented x3 not in any acute distress. HEENT: anicteric sclera, pupils reactive to light and accommodation CVS: S1-S2 clear no murmur rubs or gallops Chest: clear to auscultation bilaterally, no wheezing rales or rhonchi Abdomen: soft nontender, nondistended, normal bowel sounds Extremities: no cyanosis, clubbing or edema noted bilaterally Neuro: Cranial nerves II-XII intact, no focal neurological deficits   The results of significant diagnostics from this hospitalization (including imaging, microbiology, ancillary and laboratory) are listed below for reference.    LAB RESULTS: Basic Metabolic Panel:  Recent Labs Lab 11/07/14 2156  NA 133*  K 3.8  CL 99*  CO2 23  GLUCOSE 508*  BUN  15  CREATININE 0.68  CALCIUM 9.3   Liver Function Tests: No results for input(s): AST, ALT, ALKPHOS, BILITOT, PROT, ALBUMIN in the last 168 hours. No results for input(s): LIPASE, AMYLASE in the last 168 hours. No results for input(s): AMMONIA in the last 168 hours. CBC:  Recent Labs Lab 11/07/14 2156  WBC 5.6  HGB 13.7  HCT 39.9  MCV 86.2  PLT 192   Cardiac Enzymes:  Recent Labs Lab 11/08/14 0018  TROPONINI <0.03   BNP: Invalid input(s): POCBNP CBG:  Recent Labs Lab 11/09/14 2202 11/10/14 0656  GLUCAP 388* 225*    Significant Diagnostic Studies:  Ct Head Wo Contrast  11/07/2014   CLINICAL DATA:  Headache, dizziness starting today  EXAM: CT HEAD WITHOUT CONTRAST  TECHNIQUE: Contiguous axial images were obtained from the base of the skull  through the vertex without intravenous contrast.  COMPARISON:  None.  FINDINGS: No skull fracture is noted. Paranasal sinuses and mastoid air cells are unremarkable.  No intracranial hemorrhage, mass effect or midline shift. There is mild cerebral atrophy. There is mild periventricular and patchy subcortical white matter decreased attenuation probable due to chronic small vessel ischemic changes. Question small lacunar infarct in left posterior limb of internal capsule. See axial image 16. No definite evidence of acute cortical infarction. No mass lesion is noted on this unenhanced scan.  IMPRESSION: No acute intracranial abnormality. No definite acute cortical infarction. Mild cerebral atrophy. Patchy subcortical and mild periventricular white matter decreased attenuation probable due to chronic small vessel ischemic changes. Question small lacunar infarct in left internal capsule posterior limb.   Electronically Signed   By: Natasha MeadLiviu  Pop M.D.   On: 11/07/2014 22:35   Mri Brain Without Contrast  11/08/2014   CLINICAL DATA:  Initial evaluation for gait ataxia.  EXAM: MRI HEAD WITHOUT CONTRAST  MRA HEAD WITHOUT CONTRAST  TECHNIQUE: Multiplanar,  multiecho pulse sequences of the brain and surrounding structures were obtained without intravenous contrast. Angiographic images of the head were obtained using MRA technique without contrast.  COMPARISON:  Prior CT from 11/07/2014  FINDINGS: MRI HEAD FINDINGS  Mild diffuse prominence of the CSF containing spaces is compatible with generalized age-related cerebral atrophy. Patchy and confluent T2/FLAIR hyperintensity within the periventricular and deep white matter both cerebral hemispheres consistent with moderate chronic small vessel ischemic disease. Remote lacunar infarcts present within the left basal ganglia.  No abnormal foci of restricted diffusion to suggest acute intracranial infarct. Gray-white matter differentiation maintained. Normal intravascular flow voids preserved. No acute or chronic intracranial hemorrhage.  No mass lesion or midline shift. No hydrocephalus. No extra-axial fluid collection.  Craniocervical junction within normal limits. Degenerative changes present within the visualized upper cervical spine. Question compression deformity of the C4 vertebral body. Pituitary gland normal.  No acute abnormality about the orbits.  Paranasal sinuses are clear. No mastoid effusion. Inner ear structures normal.  Bone marrow signal intensity within normal limits.  MRA HEAD FINDINGS  ANTERIOR CIRCULATION:  Visualized distal cervical segments of the internal carotid arteries are widely patent with antegrade flow. The petrous segments are widely patent. Multi focal atheromatous irregularity present within the cavernous segments of the internal carotid arteries with associated mild to moderate multi focal stenoses, left worse than right. Mild narrowing of the supra clinoid left ICA. A1 segments mildly irregular but patent without focal high-grade stenosis. Anterior communicating artery grossly normal. Anterior cerebral arteries well opacified.  Multi focal atheromatous irregularity present within the M1  segments bilaterally, left greater than right. There are multi focal mild stenoses within the left M1 segment. Distal MCA branches are fairly symmetric.  POSTERIOR CIRCULATION:  Right vertebral artery is diminutive and appears to terminate in the right posterior communicating artery. Dominant left vertebral artery widely patent to the vertebrobasilar junction. The left posterior inferior cerebellar arteries patent. There is moderate atheromatous irregularity throughout the basilar artery, greatest within its mid and distal aspect. Associated moderate narrowing. The superior cerebellar arteries are patent bilaterally. There is fetal origin of the left posterior cerebral artery with widely patent left posterior communicating artery. Left PCA well opacified to its distal branches. Right P1 and P2 segments widely patent.  No aneurysm.  IMPRESSION: MRI HEAD IMPRESSION:  1. No acute intracranial infarct or other abnormality identified. 2. Atrophy with moderate chronic microvascular ischemic disease involving the supratentorial white matter. 3.  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  MRA HEAD IMPRESSION:  1. No proximal branch occlusion identified within the intracranial circulation. 2. Atheromatous irregularity throughout the basilar artery with secondary a moderate multi focal narrowing. Basilar artery supplied via the dominant left vertebral artery which is widely patent. The diminutive right vertebral artery terminates in PICA. 3. Multi focal atheromatous irregularity with mild stenoses within the left M1 segment. 4. Multifocal atheromatous irregularity within the cavernous carotid arteries, left greater than right. 5. Fetal origin of the left PCA.   Electronically Signed   By: Rise Mu M.D.   On: 11/08/2014 08:21   Mr Maxine Glenn Head/brain Wo Cm  11/08/2014   CLINICAL DATA:  Initial evaluation for gait ataxia.  EXAM: MRI HEAD WITHOUT CONTRAST  MRA HEAD WITHOUT CONTRAST  TECHNIQUE:  Multiplanar, multiecho pulse sequences of the brain and surrounding structures were obtained without intravenous contrast. Angiographic images of the head were obtained using MRA technique without contrast.  COMPARISON:  Prior CT from 11/07/2014  FINDINGS: MRI HEAD FINDINGS  Mild diffuse prominence of the CSF containing spaces is compatible with generalized age-related cerebral atrophy. Patchy and confluent T2/FLAIR hyperintensity within the periventricular and deep white matter both cerebral hemispheres consistent with moderate chronic small vessel ischemic disease. Remote lacunar infarcts present within the left basal ganglia.  No abnormal foci of restricted diffusion to suggest acute intracranial infarct. Gray-white matter differentiation maintained. Normal intravascular flow voids preserved. No acute or chronic intracranial hemorrhage.  No mass lesion or midline shift. No hydrocephalus. No extra-axial fluid collection.  Craniocervical junction within normal limits. Degenerative changes present within the visualized upper cervical spine. Question compression deformity of the C4 vertebral body. Pituitary gland normal.  No acute abnormality about the orbits.  Paranasal sinuses are clear. No mastoid effusion. Inner ear structures normal.  Bone marrow signal intensity within normal limits.  MRA HEAD FINDINGS  ANTERIOR CIRCULATION:  Visualized distal cervical segments of the internal carotid arteries are widely patent with antegrade flow. The petrous segments are widely patent. Multi focal atheromatous irregularity present within the cavernous segments of the internal carotid arteries with associated mild to moderate multi focal stenoses, left worse than right. Mild narrowing of the supra clinoid left ICA. A1 segments mildly irregular but patent without focal high-grade stenosis. Anterior communicating artery grossly normal. Anterior cerebral arteries well opacified.  Multi focal atheromatous irregularity present  within the M1 segments bilaterally, left greater than right. There are multi focal mild stenoses within the left M1 segment. Distal MCA branches are fairly symmetric.  POSTERIOR CIRCULATION:  Right vertebral artery is diminutive and appears to terminate in the right posterior communicating artery. Dominant left vertebral artery widely patent to the vertebrobasilar junction. The left posterior inferior cerebellar arteries patent. There is moderate atheromatous irregularity throughout the basilar artery, greatest within its mid and distal aspect. Associated moderate narrowing. The superior cerebellar arteries are patent bilaterally. There is fetal origin of the left posterior cerebral artery with widely patent left posterior communicating artery. Left PCA well opacified to its distal branches. Right P1 and P2 segments widely patent.  No aneurysm.  IMPRESSION: MRI HEAD IMPRESSION:  1. No acute intracranial infarct or other abnormality identified. 2. Atrophy with moderate chronic microvascular ischemic disease involving the supratentorial white matter. 3. 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  MRA HEAD IMPRESSION:  1. No proximal branch occlusion identified within the intracranial circulation. 2. Atheromatous irregularity throughout the basilar artery with secondary a moderate multi focal  narrowing. Basilar artery supplied via the dominant left vertebral artery which is widely patent. The diminutive right vertebral artery terminates in PICA. 3. Multi focal atheromatous irregularity with mild stenoses within the left M1 segment. 4. Multifocal atheromatous irregularity within the cavernous carotid arteries, left greater than right. 5. Fetal origin of the left PCA.   Electronically Signed   By: Rise Mu M.D.   On: 11/08/2014 08:21    2D ECHO: Impressions:  - Normal LV size with mild LV hypertrophy. EF 60-65%. Normal RV size and systolic function. No significant  valvular abnormalities.  Disposition and Follow-up:     Discharge Instructions    Diet Carb Modified    Complete by:  As directed      Discharge instructions    Complete by:  As directed   It is VERY IMPORTANT that you follow up with a PCP on a regular basis.  Check your blood glucoses before each meal and at bedtime and maintain a log of your readings.  Bring this log with you when you follow up with your PCP so that he or she can adjust your insulin at your follow up visit.     Increase activity slowly    Complete by:  As directed             DISPOSITION: home    DISCHARGE FOLLOW-UP Follow-up Information    Follow up with Ginette Otto, MD In 1 week.   Specialty:  Internal Medicine   Contact information:   [address] [place] [address] [phone]        Time spent on Discharge: 35 mins   Signed:   RAI,RIPUDEEP M.D. [facility] 11/10/2014, 10:51 AM Pager: [phone]

## 2014-11-10 NOTE — Progress Notes (Signed)
Patient is discharged from room 4N20 at this time. Alert and in stable condition. IV site d/c'd as well as tele. Instructions read to patient and understanding verbalized. Left unit via wheelchair with husband and all belongings at side.

## 2014-11-10 NOTE — Discharge Instructions (Signed)
Benign Positional Vertigo Vertigo means you feel like you or your surroundings are moving when they are not. Benign positional vertigo is the most common form of vertigo. Benign means that the cause of your condition is not serious. Benign positional vertigo is more common in older adults. CAUSES  Benign positional vertigo is the result of an upset in the labyrinth system. This is an area in the middle ear that helps control your balance. This may be caused by a viral infection, head injury, or repetitive motion. However, often no specific cause is found. SYMPTOMS  Symptoms of benign positional vertigo occur when you move your head or eyes in different directions. Some of the symptoms may include:  Loss of balance and falls.  Vomiting.  Blurred vision.  Dizziness.  Nausea.  Involuntary eye movements (nystagmus). DIAGNOSIS  Benign positional vertigo is usually diagnosed by physical exam. If the specific cause of your benign positional vertigo is unknown, your caregiver may perform imaging tests, such as magnetic resonance imaging (MRI) or computed tomography (CT). TREATMENT  Your caregiver may recommend movements or procedures to correct the benign positional vertigo. Medicines such as meclizine, benzodiazepines, and medicines for nausea may be used to treat your symptoms. In rare cases, if your symptoms are caused by certain conditions that affect the inner ear, you may need surgery. HOME CARE INSTRUCTIONS   Follow your caregiver's instructions.  Move slowly. Do not make sudden body or head movements.  Avoid driving.  Avoid operating heavy machinery.  Avoid performing any tasks that would be dangerous to you or others during a vertigo episode.  Drink enough fluids to keep your urine clear or pale yellow. SEEK IMMEDIATE MEDICAL CARE IF:   You develop problems with walking, weakness, numbness, or using your arms, hands, or legs.  You have difficulty speaking.  You develop  severe headaches.  Your nausea or vomiting continues or gets worse.  You develop visual changes.  Your family or friends notice any behavioral changes.  Your condition gets worse.  You have a fever.  You develop a stiff neck or sensitivity to light. MAKE SURE YOU:   Understand these instructions.  Will watch your condition.  Will get help right away if you are not doing well or get worse. Document Released: 03/07/2006 Document Revised: 08/22/2011 Document Reviewed: 02/17/2011 [employer] Patient Information 2015 [employer]. This information is not intended to replace advice given to you by your health care provider. Make sure you discuss any questions you have with your health care provider.    

## 2014-11-10 NOTE — Care Management Note (Signed)
Case Management Note  Patient Details  Name: Shelly Mcdaniel MRN: [id number] Date of Birth: [birth year]  Subjective/Objective:            Admitted with Ataxia.  Patient is from home.        Action/Plan: D/C with outpatient PT and [facility] to supply rolling walker.  Expected Discharge Date:       11/10/14           Expected Discharge Plan:  Home w Home Health Services  In-House Referral:     Discharge planning Services     Post Acute Care Choice:    Choice offered to:  Patient  DME Arranged:  Walker rolling DME Agency:  [facility]  [facility]     Status of Service:  Completed, signed off  Medicare Important Message Given:    Date Medicare IM Given:    Medicare IM give by:    Date Additional Medicare IM Given:    Additional Medicare Important Message give by:     If discussed at Long Length of Stay Meetings, dates discussed:    Additional Comments:  Governor Speckingdekunle, Travaughn Vue Bimbo, RN 11/10/2014, 2:03 PM

## 2014-11-10 NOTE — Progress Notes (Signed)
11/10/14  1358   CM talked to patient about the need of a Rolling walker, patient elected [facility].  CM spoke with Vaughan BastaJermaine of [facility] at [phone] and arranged for a rolling walker to be delivered to room.

## 2014-11-10 NOTE — Progress Notes (Signed)
STROKE TEAM PROGRESS NOTE   HISTORY Shelly Mcdaniel is a 59 y.o. female who reports that she was in her usual state of health for the majority of the day yesterday. She sat down to do some work on the computer at about 1800 and noted no problems. When she stood up about 2 hours later she was off balance and leaning to the right as she attempted to walk. (LKW 11/07/2014 at 1800). The patient had no nausea or vomiting. Although she has had vertigo in the past, this episode seemed worse than usual. Patient presented to [facility] for evaluation where a code stroke was called. NIHSS of 1. Patient also reports pain in the right ear and pain around the right eye. She has had issues with ear infections in the right ear in the past.  tPA was not given as patient refused. She was transferred to [facility] for further evaluation and treatment.    SUBJECTIVE (INTERVAL HISTORY) Husband at bedside. Patient feels she is improving.   OBJECTIVE Temp:  [97.7 F (36.5 C)-98.6 F (37 C)] 98.6 F (37 C) (05/30 0912) Pulse Rate:  [67-83] 80 (05/30 0912) Cardiac Rhythm:  [-] Other (Comment) (05/30 0311) Resp:  [17-18] 18 (05/30 0912) BP: (133-169)/(63-89) 133/72 mmHg (05/30 0912) SpO2:  [97 %-100 %] 100 % (05/30 0912)   Recent Labs Lab 11/09/14 1207 11/09/14 1709 11/09/14 1712 11/09/14 2202 11/10/14 0656  GLUCAP 314* 338* 315* 388* 225*    Recent Labs Lab 11/07/14 2156  NA 133*  K 3.8  CL 99*  CO2 23  GLUCOSE 508*  BUN 15  CREATININE 0.68  CALCIUM 9.3   No results for input(s): AST, ALT, ALKPHOS, BILITOT, PROT, ALBUMIN in the last 168 hours.  Recent Labs Lab 11/07/14 2156  WBC 5.6  HGB 13.7  HCT 39.9  MCV 86.2  PLT 192    Recent Labs Lab 11/08/14 0018  TROPONINI <0.03   No results for input(s): LABPROT, INR in the last 72 hours.  Recent Labs  11/08/14 0046  COLORURINE YELLOW  LABSPEC 1.043*  PHURINE 5.0  GLUCOSEU >1000*  HGBUR NEGATIVE  BILIRUBINUR NEGATIVE  KETONESUR  NEGATIVE  PROTEINUR NEGATIVE  UROBILINOGEN 0.2  NITRITE NEGATIVE  LEUKOCYTESUR NEGATIVE       Component Value Date/Time   CHOL 177 11/08/2014 0527   TRIG 294* 11/08/2014 0527   HDL 28* 11/08/2014 0527   CHOLHDL 6.3 11/08/2014 0527   VLDL 59* 11/08/2014 0527   LDLCALC 90 11/08/2014 0527   Lab Results  Component Value Date   HGBA1C 10.9* 01/06/2011   No results found for: LABOPIA, COCAINSCRNUR, LABBENZ, AMPHETMU, THCU, LABBARB  No results for input(s): ETH in the last 168 hours.  Ct Head Wo Contrast 11/07/2014    No acute intracranial abnormality. No definite acute cortical infarction. Mild cerebral atrophy. Patchy subcortical and mild periventricular white matter decreased attenuation probable due to chronic small vessel ischemic changes. Question small lacunar infarct in left internal capsule posterior limb.   MRI HEAD IMPRESSION:   11/08/2014  1. No acute intracranial infarct or other abnormality identified.  2. Atrophy with moderate chronic microvascular ischemic disease involving the supratentorial white matter.  3. 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     MRA HEAD IMPRESSION:   11/08/2014  1. No proximal branch occlusion identified within the intracranial circulation.  2. Atheromatous irregularity throughout the basilar artery with secondary a moderate multi focal narrowing. Basilar artery supplied via the dominant left vertebral artery which  is widely patent. The diminutive right vertebral artery terminates in PICA.  3. Multi focal atheromatous irregularity with mild stenoses within the left M1 segment.  4. Multifocal atheromatous irregularity within the cavernous carotid arteries, left greater than right.  5. Fetal origin of the left PCA.     2D Echocardiogram - Left ventricle: The cavity size was normal. Wall thickness wasincreased in a pattern of mild LVH. Systolic function was normal.The estimated ejection fraction was in the range of 60%  to 65%.Wall motion was normal; there were no regional wall motionabnormalities. Doppler parameters are consistent with abnormalleft ventricular relaxation (grade 1 diastolic dysfunction). - Aortic valve: There was no stenosis. - Mitral valve: Mildly calcified annulus. Mildly calcified leaflets. There was no significant regurgitation.. - Right ventricle: The cavity size was normal. Systolic functionwas normal. - Pulmonary arteries: No complete TR doppler jet so unable toestimate PA systolic pressure. - Inferior vena cava: The vessel was normal in size. The respirophasic diameter changes were in the normal range (>= 50%),consistent with normal central venous pressure. Impressions: Normal LV size with mild LV hypertrophy. EF 60-65%. Normal RVsize and systolic function. No significant valvularabnormalities.  Carotid Doppler  There is 1-39% bilateral ICA stenosis. Vertebral artery flow is antegrade.     PHYSICAL EXAM Mental Status: Alert, oriented, thought content appropriate. Speech fluent without evidence of aphasia. Able to follow 3 step commands without difficulty. Cranial Nerves: II: Visual fields grossly normal, pupils equal, round, reactive to light and accommodation III,IV, VI: ptosis not present, extra-ocular motions intact bilaterally V,VII: smile symmetric, facial light touch sensation normal bilaterally VIII: hearing normal bilaterally IX,X: gag reflex present XI: bilateral shoulder shrug XII: midline tongue extension Motor: Right :Upper extremity 5/5Left: Upper extremity 5/5 Lower extremity 5/5Lower extremity 5/5 Tone and bulk:normal tone throughout; no atrophy noted Sensory: Light touch intact throughout, bilaterally Deep Tendon Reflexes: 2+ throughout with an absent left AJ Plantars: Right: downgoingLeft:  downgoing Cerebellar: normal finger-to-nose bilaterally Gait: Deferred    ASSESSMENT/PLAN Shelly Mcdaniel is a 59 y.o. female with history of diabetes mellitus, hypertension, and hyperlipidemia, presenting with gait ataxia. She did not receive IV t-PA as patient declined.   Recurrent peripheral vestibular dysfunction. Stroke workup negative.   Resultant resolution of deficits  MRI  no acute infarct  MRA  no high-grade stenosis  Carotid Doppler No significant stenosis   2D Echo  No source of embolus   LDL 90  HgbA1c pending  SCDs for VTE prophylaxis Diet heart healthy/carb modified Room service appropriate?: Yes; Fluid consistency:: Thin Diet Carb Modified  no antithrombotic prior to admission, now on aspirin 325 mg orally every day  Patient counseled to be compliant with her antithrombotic medications  Ongoing aggressive stroke risk factor management  Therapy recommendations:  Outpatient physical therapy recommended.  Disposition: home with OP therapy  No neurology follow up indicated  Stroke team will sign off. Please call for questions.  Hypertension  Home meds: Accuretic  Stable  Hyperlipidemia  Home meds:  Zocor resumed in hospital  LDL 90  Continue statin at discharge  Diabetes  HgbA1c pending, goal < 7.0  Uncontrolled  Other Stroke Risk Factors  Obesity, Body mass index is 28.73 kg/(m^2).   Hx stroke/TIA - Remote lacunar infarcts by MRI  Family hx stroke (brother)  [facility] See Amion for Pager information 11/10/2014 12:55 PM  I have personally examined this patient, reviewed notes, independently viewed imaging studies, participated in medical decision making and plan of care.  I have made any additions or clarifications directly to the above note. Agree with note above. Stroke team will sign off. Kindly call for questions.  Delia HeadyPramod Dehlia Kilner, MD Medical Director [facility] Pager:  [phone] 11/10/2014 3:39 PM  To contact Stroke Continuity provider, please refer to [email or web address]. After hours, contact General Neurology

## 2014-11-11 LAB — HEMOGLOBIN A1C
Hgb A1c MFr Bld: 14.1 % — ABNORMAL HIGH (ref 4.8–5.6)
Mean Plasma Glucose: 358 mg/dL

## 2014-11-13 ENCOUNTER — Encounter: Payer: Self-pay | Admitting: Physical Therapy

## 2014-11-13 ENCOUNTER — Ambulatory Visit: Payer: BC Managed Care – PPO | Attending: Geriatric Medicine | Admitting: Physical Therapy

## 2014-11-13 DIAGNOSIS — H8112 Benign paroxysmal vertigo, left ear: Secondary | ICD-10-CM | POA: Diagnosis not present

## 2014-11-13 NOTE — Therapy (Signed)
[facility] [address] [place], [place], [address] Phone: [phone]   Fax:  [phone]  Physical Therapy Evaluation  Patient Details  Name: Shelly Mcdaniel MRN: [id number] Date of Birth: 1955/11/02 Referring Provider:  Merlene Laughter, MD cc:  Thad Ranger, MD  Encounter Date: 11/13/2014      PT End of Session - 11/13/14 1612    Visit Number 1   Number of Visits 4   Date for PT Re-Evaluation 12/13/14   Authorization Type BCBS   PT Start Time 0759   PT Stop Time 0848   PT Time Calculation (min) 49 min      Past Medical History  Diagnosis Date  . Diabetes mellitus without complication   . Hypertension   . Hyperlipidemia     Past Surgical History  Procedure Laterality Date  . Breast surgery    . Back surgery    . Colonoscopy with propofol N/A 12/24/2013    Procedure: COLONOSCOPY WITH PROPOFOL;  Surgeon: Charolett Bumpers, MD;  Location: [facility];  Service: Endoscopy;  Laterality: N/A;    There were no vitals filed for this visit.  Visit Diagnosis:  BPPV (benign paroxysmal positional vertigo), left - Plan: PT plan of care cert/re-cert      Subjective Assessment - 11/13/14 1540    Subjective Pt. presents to OP PT ambulating without device with very unsteady gait; pt reports she became dizzy last Fri., 5-27 while she was getting a pedicure; went home and rested and later got up to go out to deat and was unable to ambulate without LOB; husband transported her to [facility] and it was thought she was having a CVA, so they transported her to [facility] via [facility]; pt. states MRI and CT scan ruled out TIA /CVA;  pt received inpatient PT with treatment of canalith repositioning for horizontal canal BPPV and states she is doing much better now; states she did have some vertigo when she quickly turned onto L side last night but states she had no vertigo this am                                                                                                                                             Patient is accompained by: --  husband   Patient Stated Goals resolve the vertigo   Currently in Pain? No/denies            [facility] PT Assessment - 11/13/14 0804    Assessment   Medical Diagnosis BPPV   Onset Date/Surgical Date 11/07/14   Prior Therapy had canalith repositioning maneuver in hospital   Balance Screen   Has the patient fallen in the past 6 months No   Has the patient had a decrease in activity level because of a fear of falling?  No   Is the patient reluctant to  leave their home because of a fear of falling?  No            Vestibular Assessment - 11/13/14 0807    Vestibular Assessment   General Observation Pt. reports most recent vertigo experienced was last night when she rolled over in bed last night; has not had any vertigo this am   Symptom Behavior   Type of Dizziness Spinning   Frequency of Dizziness rarely at this time   Duration of Dizziness few secs   Aggravating Factors Rolling to left   Relieving Factors Lying supine   Occulomotor Exam   Occulomotor Alignment Normal   Spontaneous Absent   Gaze-induced Absent   Smooth Pursuits Intact  mild vertigo reported with vertical smooth pursuits   Vestibulo-Occular Reflex   VOR 1 Head Only (x 1 viewing) no c/o vertigo   Positional Testing   Dix-Hallpike Dix-Hallpike Right;Dix-Hallpike Left   Sidelying Test Sidelying Right   Horizontal Canal Testing Horizontal Canal Left   Dix-Hallpike Right   Dix-Hallpike Right Duration None   Dix-Hallpike Right Symptoms No nystagmus   Dix-Hallpike Left   Dix-Hallpike Left Duration None   Dix-Hallpike Left Symptoms No nystagmus   Horizontal Canal Right   Horizontal Canal Right Duration None   Horizontal Canal Right Symptoms Normal   Horizontal Canal Left   Horizontal Canal Left Duration secs   Horizontal Canal Left Symptoms Geotrophic;Nystagmus   Positional Sensitivities   Sit  to Supine No dizziness   Supine to Left Side No dizziness   Supine to Right Side No dizziness   Right Hallpike No dizziness   Up from Right Hallpike No dizziness   Up from Left Hallpike No dizziness   Rolling Right No dizziness   Rolling Left Mild dizziness                       PT Education - 11/13/14 1608    Education provided Yes   Education Details see HEP - EO feet together, EC feet apart on floor, SLS, gaze stabilization   Person(s) Educated Patient;Spouse   Methods Explanation;Demonstration   Comprehension Verbalized understanding;Returned demonstration             PT Long Term Goals - 11/13/14 1641    PT LONG TERM GOAL #1   Title Pt. will have a negative L horizontal roll test to indicate that L horizontal canal BPPV has resolved   Baseline 12-14-14   Time 4   Period Weeks   Status New   PT LONG TERM GOAL #2   Title Pt. will report all vertigo has resolved   Baseline 12-14-14   Time 4   Period Weeks   Status New   PT LONG TERM GOAL #3   Title Amb. 250' without device without LOB independently   Baseline 12-14-14   Time 4   Period Weeks   Status New   PT LONG TERM GOAL #4   Title Independent in HEP for vestibular and balance exercises.   Baseline 12-14-14   Time 4   Period Weeks   Status New   PT LONG TERM GOAL #5   Title Incr. FOTO by 10 points to demo improved pt.'s function and satisfaction   Baseline 12-14-14   Time 4   Period Weeks   Status New               Plan - 11/13/14 1613    Clinical Impression Statement Pt. presents with signs &  symptoms consistent with L horizontal canal BPPV; responded well to bar-b-que roll for canalith repositioning maneuver for L horizontal canal BPPV with pt reporting no vertigo after treatment; pt. stated she felt much better after treatment and gait was more steady                                                                                        Pt will benefit from skilled therapeutic  intervention in order to improve on the following deficits Abnormal gait;Decreased coordination;Dizziness;Decreased balance;Decreased mobility   Rehab Potential Good   PT Frequency 1x / week   PT Duration 4 weeks   PT Treatment/Interventions Canalith Repostioning;ADLs/Self Care Home Management;Gait training;Therapeutic activities;Therapeutic exercise;Balance training;Neuromuscular re-education;Patient/family education;Vestibular   PT Next Visit Plan recheck L horizontal canal BPPV   PT Home Exercise Plan check HEP   Consulted and Agree with Plan of Care Patient         Problem List Patient Active Problem List   Diagnosis Date Noted  . 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 TIA (transient ischemic attack) 11/07/2014  . Hypertension 11/07/2014  . DM II (diabetes mellitus, type II), controlled 11/07/2014    DildayDonavan Burnet, Zamauri Nez Suzanne, PT 11/13/2014, 5:01 PM  [facility] [address] [place], [place], [address] Phone: [phone]   Fax:  [phone]

## 2014-11-13 NOTE — Patient Instructions (Signed)
SINGLE LIMB STANCE   Stance: single leg on floor. Raise leg. Hold ___ seconds. Repeat with other leg. ___ reps per set, ___ sets per day, ___ days per week  Copyright  VHI. All rights reserved.  Gaze Stabilization: Standing Feet Apart (Compliant Surface)   Feet apart on pillow, keeping eyes on target on wall ____ feet away, tilt head down 15-30 and move head side to side for ____ seconds. Repeat while moving head up and down for ____ seconds. Do ____ sessions per day. Repeat using target on pattern background.  Copyright  VHI. All rights reserved.  Feet Together (Compliant Surface) Varied Arm Positions - Eyes Open   With eyes open, standing on compliant surface: ________, feet together and arms out, look at a stationary object. Hold ____ seconds. Repeat ____ times per session. Do ____ sessions per day.  Copyright  VHI. All rights reserved.  Balance: Eyes Closed - Bilateral (Varied Surfaces)   Stand, feet shoulder width, close eyes. Maintain balance ____ seconds. Repeat ____ times per set. Do ____ sets per session. Do ____ sessions per week. Repeat on compliant surface: foam.  Copyright  VHI. All rights reserved.

## 2014-11-24 ENCOUNTER — Ambulatory Visit: Payer: BC Managed Care – PPO | Admitting: Physical Therapy

## 2014-11-24 ENCOUNTER — Encounter: Payer: Self-pay | Admitting: Physical Therapy

## 2014-11-24 DIAGNOSIS — H8112 Benign paroxysmal vertigo, left ear: Secondary | ICD-10-CM

## 2014-11-24 NOTE — Patient Instructions (Signed)
Sit to Side-Lying   Sit on edge of bed. 1. Turn head 45 to right. 2. Maintain head position and lie down slowly on left side. Hold until symptoms subside. 3. Sit up slowly. Hold until symptoms subside. 4. Turn head 45 to left. 5. Maintain head position and lie down slowly on right side. Hold until symptoms subside. 6. Sit up slowly. Repeat sequence ____ times per session. Do ____ sessions per day.  Copyright  VHI. All rights reserved.  Self Treatment for Horizontal Canalithiasis - Left Side Affected   Beginning on back, roll onto left side for 60 seconds, then roll onto right side. Remain on right side for 12 hours. Repeat procedure if you get up during the night. Perform nightly until symptom free.  Copyright  VHI. All rights reserved.

## 2014-11-24 NOTE — Therapy (Signed)
[facility] [address] [place], [place], [address] Phone: [phone]   Fax:  [phone]  Physical Therapy Treatment  Patient Details  Name: Shelly Mcdaniel MRN: [id number] Date of Birth: 1956/04/30 Referring Provider:  Merlene Laughter, MD  Encounter Date: 11/24/2014      PT End of Session - 11/24/14 1712    Visit Number 2   Number of Visits 4   Date for PT Re-Evaluation 12/13/14   Authorization Type BCBS   PT Start Time 0935   PT Stop Time 1020   PT Time Calculation (min) 45 min      Past Medical History  Diagnosis Date  . Diabetes mellitus without complication   . Hypertension   . Hyperlipidemia     Past Surgical History  Procedure Laterality Date  . Breast surgery    . Back surgery    . Colonoscopy with propofol N/A 12/24/2013    Procedure: COLONOSCOPY WITH PROPOFOL;  Surgeon: Charolett Bumpers, MD;  Location: [facility];  Service: Endoscopy;  Laterality: N/A;    There were no vitals filed for this visit.  Visit Diagnosis:  BPPV (benign paroxysmal positional vertigo), left      Subjective Assessment - 11/24/14 1700    Subjective Pt. 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 No/denies                          Vestibular Treatment/Exercise - 11/24/14 0001    Vestibular Treatment/Exercise   Vestibular Treatment Provided Canalith Repositioning   Canalith Repositioning Epley Manuever Left   Habituation Exercises Brandt Daroff;Horizontal Roll    EPLEY MANUEVER LEFT   Number of Reps  2   Overall Response  Symptoms Worsened    RESPONSE DETAILS LEFT symptoms worsened on 2nd rep with some possible horizontal nystagmus noted (possible direction changing noted);  pt did say she felt better after 2nd rep was completed compared to how she felt at beginning of session   Austin Miles   Number of Reps  1  for HEP instruction   Horizontal Roll   Number of Reps  --  instructed to roll left to right at home for habituation     Self care; discussed status and symptoms with pt since treatment of Bar-B-que roll for repositioning on 11-13-14; discussed HEP - balance exercises - and pt. States she was unable to attempt standing on pillow with EO due to incr. unsteadiness And unable to do single limb stance due to lack of balance  Neuro re-ed: R sidelying test positive for nystagmus and c/o vertigo - difficult to see due to pt. Taking meclizine last night; close observation Confirmed nystagmus; R and L sidelying tests performed with pt reporting some mild vertigo in L sidelying but no c/o vertigo in R sidelying Position; Horizontal roll test R and L sides negative for nystagmus and for c/o vertigo        PT Education - 11/24/14 1712    Education provided Yes   Person(s) Educated Patient   Methods Explanation;Demonstration   Comprehension Verbalized understanding;Returned demonstration             PT Long Term Goals - 11/13/14 1641    PT LONG  TERM GOAL #1   Title Pt. will have a negative L horizontal roll test to indicate that L horizontal canal BPPV has resolved   Baseline 12-14-14   Time 4   Period Weeks   Status New   PT LONG TERM GOAL #2   Title Pt. will report all vertigo has resolved   Baseline 12-14-14   Time 4   Period Weeks   Status New   PT LONG TERM GOAL #3   Title Amb. 250' without device without LOB independently   Baseline 12-14-14   Time 4   Period Weeks   Status New   PT LONG TERM GOAL #4   Title Independent in HEP for vestibular and balance exercises.   Baseline 12-14-14   Time 4   Period Weeks   Status New   PT LONG TERM GOAL #5   Title Incr. FOTO by 10 points to demo improved pt.'s function and satisfaction   Baseline  12-14-14   Time 4   Period Weeks   Status New               Plan - 11/24/14 1713    Clinical Impression Statement Pt.'s nystagmus difficult to see and determine direction due to pt having taken meclizine last night; appeared to be direction changing from L rotary to L horizontal and was of longer duration on 2nd rep of epley's   Pt will benefit from skilled therapeutic intervention in order to improve on the following deficits Abnormal gait;Decreased coordination;Dizziness;Decreased balance;Decreased mobility   Rehab Potential Good   PT Frequency 1x / week   PT Duration 4 weeks   PT Treatment/Interventions Canalith Repostioning;ADLs/Self Care Home Management;Gait training;Therapeutic activities;Therapeutic exercise;Balance training;Neuromuscular re-education;Patient/family education;Vestibular   PT Next Visit Plan check L posterior and L horizontal canal BPPV   PT Home Exercise Plan check HEP   Consulted and Agree with Plan of Care Patient        Problem List Patient Active Problem List   Diagnosis Date Noted  . 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 TIA (transient ischemic attack) 11/07/2014  . Hypertension 11/07/2014  . DM II (diabetes mellitus, type II), controlled 11/07/2014    Kary Kos 11/24/2014, 5:20 PM   [facility] [address] [place], [place], [address] Phone: [phone]   Fax:  [phone]

## 2014-11-27 ENCOUNTER — Ambulatory Visit: Payer: BC Managed Care – PPO | Admitting: Physical Therapy

## 2015-04-07 ENCOUNTER — Other Ambulatory Visit: Payer: Self-pay | Admitting: Orthopedic Surgery

## 2015-04-07 DIAGNOSIS — M546 Pain in thoracic spine: Secondary | ICD-10-CM

## 2015-04-16 ENCOUNTER — Inpatient Hospital Stay
Admission: RE | Admit: 2015-04-16 | Discharge: 2015-04-16 | Disposition: A | Discharge status: Home / Self Care | Payer: Self-pay | Source: Ambulatory Visit | Attending: Orthopedic Surgery | Admitting: Orthopedic Surgery

## 2015-04-28 ENCOUNTER — Other Ambulatory Visit (HOSPITAL_COMMUNITY): Payer: Self-pay | Admitting: Urology

## 2015-04-28 ENCOUNTER — Ambulatory Visit
Admission: RE | Admit: 2015-04-28 | Discharge: 2015-04-28 | Disposition: A | Discharge status: Home / Self Care | Payer: BC Managed Care – PPO | Source: Ambulatory Visit | Attending: Orthopedic Surgery | Admitting: Orthopedic Surgery

## 2015-04-28 DIAGNOSIS — M546 Pain in thoracic spine: Secondary | ICD-10-CM

## 2015-05-04 ENCOUNTER — Ambulatory Visit (INDEPENDENT_AMBULATORY_CARE_PROVIDER_SITE_OTHER): Payer: BC Managed Care – PPO

## 2015-05-04 ENCOUNTER — Ambulatory Visit (INDEPENDENT_AMBULATORY_CARE_PROVIDER_SITE_OTHER): Payer: BC Managed Care – PPO | Admitting: Family Medicine

## 2015-05-04 VITALS — BP 142/80 | HR 118 | Temp 98.1°F | Resp 16 | Ht 67.5 in | Wt 166.0 lb

## 2015-05-04 DIAGNOSIS — S2232XA Fracture of one rib, left side, initial encounter for closed fracture: Secondary | ICD-10-CM | POA: Diagnosis not present

## 2015-05-04 DIAGNOSIS — R0789 Other chest pain: Secondary | ICD-10-CM

## 2015-05-04 DIAGNOSIS — M546 Pain in thoracic spine: Secondary | ICD-10-CM

## 2015-05-04 MED ORDER — TRAMADOL HCL 50 MG PO TABS: 50.0000 mg | ORAL_TABLET | Freq: Four times a day (QID) | ORAL | Status: DC | PRN

## 2015-05-04 MED ORDER — DICLOFENAC SODIUM 75 MG PO TBEC: 75.0000 mg | DELAYED_RELEASE_TABLET | Freq: Two times a day (BID) | ORAL | Status: DC

## 2015-05-04 MED ORDER — GABAPENTIN 100 MG PO CAPS: 300.0000 mg | ORAL_CAPSULE | Freq: Every day | ORAL | Status: DC

## 2015-05-04 NOTE — Progress Notes (Signed)
Patient ID: Shelly Mcdaniel, female    DOB: 08/01/1955  Age: 59 y.o. MRN: [id number]  Chief Complaint  Patient presents with  . Rib Injury    On 03/31/15-Ortho gave her Percocet-wants something else instead of Narcotics    Subjective:   About a month ago patient fell on her left side. She had immediate left lateral rib pain. She was seen by an orthopedist who did x-rays. No fracture was noted. She was calming down a little bit and started hurting more in the more central low thoracic area of the back around to the left. He is very tender to touch. She continues to be very tender to touch in the lateral chest wall. She had an MRI of her spine which showed some nonspecific bulges of the disks, but nothing encroaching on nerves. She took some oxycodone for pain, but has minimized taking that. She really does not want to be on narcotic pain medicines. She spoke to the other physician about going on gabapentin, but they were not comfortable doing that.  The daughter of the patient is an anesthesiologist and has talked to her about pain medications. 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  She is diabetic and hypertensive, on medications. She has taken Flexeril, Toradol, and Motrin in the process of things try to get relief.  Current allergies, medications, problem list, past/family and social histories reviewed.  Objective:  BP 142/80 mmHg  Pulse 118  Temp(Src) 98.1 F (36.7 C)  Resp 16  Ht 5' 7.5" (1.715 m)  Wt 166 lb (75.297 kg)  BMI 25.60 kg/m2  SpO2 98%  Pleasant lady. Describes where her pain is lateral to her left breast and around in her back. Her chest is clear to auscultation. She is very tender in the lateral chest wall about the seventh eighth and ninth rib levels. She is tender around toward the spine. She has tenderness of the lower thoracic spine about 4-5 levels.  Assessment & Plan:   Assessment: 1. Chest wall pain    2. Left-sided thoracic back pain   3. Fracture, ribs, left, closed, initial encounter       Plan: Rib x-rays again to make sure there is not a fracture that did not show up before.  [facility] reading (PRIMARY) by  Dr. Alwyn RenHopper 2 old rib fractures 8th and 9th left  .    Patient Instructions  Decrease sertraline to one half pill if taking the tramadol  Tramadol 1 every 6 or 8 hours if needed for pain not relieved by NSAIDS or Tylenol  Take gabapentin 100 mg 1 daily for 2 days, then twice daily for 2 days, then 3 times daily  Take diclofenac one twice daily for pain and inflammation. Do not take ibuprofen or Aleve products while on this.  This should gradually improve with time, but if not improving over the next 3 or 4 weeks she should get rechecked again.  Return at any time if acutely worse.     Return if symptoms worsen or fail to improve.   HOPPER,DAVID, MD 05/04/2015

## 2015-05-04 NOTE — Patient Instructions (Signed)
Decrease sertraline to one half pill if taking the tramadol  Tramadol 1 every 6 or 8 hours if needed for pain not relieved by NSAIDS or Tylenol  Take gabapentin 100 mg 1 daily for 2 days, then twice daily for 2 days, then 3 times daily  Take diclofenac one twice daily for pain and inflammation. Do not take ibuprofen or Aleve products while on this.  This should gradually improve with time, but if not improving over the next 3 or 4 weeks she should get rechecked again.  Return at any time if acutely worse.

## 2015-05-05 ENCOUNTER — Telehealth: Payer: Self-pay

## 2015-05-05 NOTE — Telephone Encounter (Signed)
Patient was seen on 05/04/15, she filled out a release for her x-ray disc, did anyone make this disc and give it to the patient?

## 2015-05-05 NOTE — Telephone Encounter (Signed)
LMOM that xray disc is ready for pickup at 102

## 2015-05-27 ENCOUNTER — Ambulatory Visit (INDEPENDENT_AMBULATORY_CARE_PROVIDER_SITE_OTHER): Payer: Self-pay | Admitting: Physician Assistant

## 2015-05-27 VITALS — BP 148/98 | HR 97 | Temp 97.9°F | Resp 16 | Ht 67.0 in | Wt 170.0 lb

## 2015-05-27 DIAGNOSIS — Z020 Encounter for examination for admission to educational institution: Secondary | ICD-10-CM

## 2015-05-27 DIAGNOSIS — Z0289 Encounter for other administrative examinations: Secondary | ICD-10-CM

## 2015-05-27 NOTE — Progress Notes (Signed)
05/28/2015 11:06 AM   DOB: June 06, 1956 / MRN: [id number]  SUBJECTIVE:  Shelly Mcdaniel is a 58 y.o. female presenting for a school physical.  She has a history of well controlled hypertension and did not take her medication today.  Reports her last follow up with her PCP was 2 months ago and no medication adjustments were made at that time.    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  She has a form with her and would like this completed. She feels well today.   She is allergic to bee venom; codeine; lipitor; and septra.   She  has a past medical history of Diabetes mellitus without complication (HCC); Hypertension; and Hyperlipidemia.    She  reports that she has never smoked. She has never used smokeless tobacco. She reports that she does not drink alcohol or use illicit drugs. She  has no sexual activity history on file. The patient  has past surgical history that includes Breast surgery; Back surgery; and Colonoscopy with propofol (N/A, 12/24/2013).  Her family history includes CAD in her brother and father; Dementia in her mother; Diabetes in her brother, father, other, and sister; Heart disease in her other; Hyperlipidemia in her other; Hypertension in her other; Kidney failure in her brother; Stroke in her brother, father, mother, and other.  Review of Systems  Constitutional: Negative for fever and chills.  Eyes: Negative for blurred vision.  Respiratory: Negative for cough and shortness of breath.   Cardiovascular: Negative for chest pain.  Gastrointestinal: Negative for nausea and abdominal pain.  Genitourinary: Negative for dysuria, urgency and frequency.  Musculoskeletal: Negative for myalgias.  Skin: Negative for rash.  Neurological: Negative for dizziness, tingling and headaches.  Psychiatric/Behavioral: Negative for depression. The patient is not nervous/anxious.     Problem list and medications reviewed and updated by myself where  necessary, and exist elsewhere in the encounter.   OBJECTIVE:  BP 148/98 mmHg  Pulse 97  Temp(Src) 97.9 F (36.6 C) (Oral)  Resp 16  Ht  (1.702 m)  Wt 170 lb (77.111 kg)  BMI 26.62 kg/m2  SpO2 97% CrCl cannot be calculated (Patient has no serum creatinine result on file.).  Physical Exam  Constitutional: She is oriented to person, place, and time. She appears well-nourished. No distress.  Eyes: EOM are normal. Pupils are equal, round, and reactive to light.  Cardiovascular: Normal rate and regular rhythm.   Pulmonary/Chest: Effort normal and breath sounds normal.  Abdominal: Soft. Bowel sounds are normal. She exhibits no distension. There is no tenderness.  Neurological: She is alert and oriented to person, place, and time. She has normal strength and normal reflexes. No cranial nerve deficit or sensory deficit. She displays a negative Romberg sign. Gait normal.  Skin: Skin is dry. She is not diaphoretic.  Psychiatric: She has a normal mood and affect. Her speech is normal and behavior is normal. Thought content normal.  Vitals reviewed.   No results found for this or any previous visit (from the past 48 hour(s)).  ASSESSMENT AND PLAN  Shelly Mcdaniel was seen today for annual exam and other.  Diagnoses and all orders for this visit:  School physical exam: Form completed.  Patient with elevated BP today however has not taken her medication in over 24 hours. She is asymptomatic.  Advised that she take this ASAP and follow up with her PCP as needed for HTN.    The patient was advised to  call or return to clinic if she does not see an improvement in symptoms or to seek the care of the closest emergency department if she worsens with the above plan.   Shelly Mcdaniel, MHS, PA-C [facility] 05/28/2015 11:06 AM

## 2015-06-05 ENCOUNTER — Telehealth: Payer: Self-pay

## 2015-06-05 MED ORDER — GABAPENTIN 100 MG PO CAPS: 300.0000 mg | ORAL_CAPSULE | Freq: Every day | ORAL | Status: DC

## 2015-06-05 NOTE — Telephone Encounter (Signed)
Re-sent the RF Dr Alwyn RenHopper had put on Rx last month.

## 2015-08-05 ENCOUNTER — Other Ambulatory Visit: Payer: Self-pay | Admitting: Family Medicine

## 2015-08-20 ENCOUNTER — Ambulatory Visit (INDEPENDENT_AMBULATORY_CARE_PROVIDER_SITE_OTHER): Payer: BC Managed Care – PPO | Admitting: Family Medicine

## 2015-08-20 ENCOUNTER — Ambulatory Visit (INDEPENDENT_AMBULATORY_CARE_PROVIDER_SITE_OTHER): Payer: BC Managed Care – PPO

## 2015-08-20 VITALS — BP 152/88 | HR 102 | Temp 98.3°F | Resp 16 | Ht 68.0 in | Wt 168.0 lb

## 2015-08-20 DIAGNOSIS — Z8781 Personal history of (healed) traumatic fracture: Secondary | ICD-10-CM | POA: Diagnosis not present

## 2015-08-20 DIAGNOSIS — M792 Neuralgia and neuritis, unspecified: Secondary | ICD-10-CM | POA: Diagnosis not present

## 2015-08-20 DIAGNOSIS — R0789 Other chest pain: Secondary | ICD-10-CM

## 2015-08-20 MED ORDER — GABAPENTIN 100 MG PO CAPS: ORAL_CAPSULE | ORAL | Status: DC

## 2015-08-20 MED ORDER — TRAMADOL HCL 50 MG PO TABS: 50.0000 mg | ORAL_TABLET | Freq: Four times a day (QID) | ORAL | Status: DC | PRN

## 2015-08-20 MED ORDER — GABAPENTIN 300 MG PO CAPS: ORAL_CAPSULE | ORAL | Status: DC

## 2015-08-20 NOTE — Progress Notes (Signed)
Patient ID: Shelly Mcdaniel, female    DOB: 1955-11-17  Age: 60 y.o. MRN: [id number]  Chief Complaint  Patient presents with  . Follow-up    broken ribs, x 5 months, pt. says got worse in a month     Subjective:   Patient previously had rib fractures and has continued to persist with severe pain in her left back. She has had shingles but that was elsewhere. Pain has been pretty in the past to take at times. 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 She was tearful when I came in the room. Current allergies, medications, problem list, past/family and social histories reviewed.  Objective:  BP 152/88 mmHg  Pulse 102  Temp(Src) 98.3 F (36.8 C) (Oral)  Resp 16  Ht  (1.727 m)  Wt 168 lb (76.204 kg)  BMI 25.55 kg/m2  SpO2 98%  Pleasant lady, alert and oriented but tearful because she is been so miserable with the discomfort. She has been taking 300 of gabapentin, but the one time she took When she felt better. Chest is clear. Heart regular without any murmurs. Chest wall nontender. Not any deformity of her back. She is tender just left of the spine at about the T8 range.  Assessment & Plan:   Assessment: 1. Left-sided chest wall pain   2. History of rib fracture   3. Neuropathic pain       Plan: Repeat x-rays.    Orders Placed This Encounter  Procedures  . DG Thoracic Spine 2 View    Order Specific Question:  Reason for Exam (SYMPTOM  OR DIAGNOSIS REQUIRED)    Answer:  left thoracic pain    Order Specific Question:  Is the patient pregnant?    Answer:  No    Order Specific Question:  Preferred imaging location?    Answer:  External  . DG Ribs Unilateral W/Chest Left    Order Specific Question:  Reason for Exam (SYMPTOM  OR DIAGNOSIS REQUIRED)    Answer:  old rib fractures with severe chest wall pains    Order Specific Question:  Is the patient pregnant?    Answer:  No    Order Specific Question:  Preferred imaging location?   Answer:  External    Meds ordered this encounter  Medications  . gabapentin (NEURONTIN) 100 MG capsule    Sig: Titrate dose as directed    Dispense:  90 capsule    Refill:  1  . traMADol (ULTRAM) 50 MG tablet    Sig: Take 1 tablet (50 mg total) by mouth every 6 (six) hours as needed.    Dispense:  30 tablet    Refill:  1  . gabapentin (NEURONTIN) 300 MG capsule    Sig: Titrate dose as directed    Dispense:  90 capsule    Refill:  1     No fractures noted. I don't see any abnormality of the spine. There is no radiology reading on this. I think something fail to get sent in today on that and I will leave a note for radiology.    Patient Instructions  Increase the gabapentin to 100 mg in the morning and 300 mg at night, then on up to 100/100/300 after 1 week, then gradually go up by 100 mg every 5-7 days if tolerated up to a desired level of 300 mg 3 times daily.  If not getting better relief we may have to make  a referral back to the neurosurgeons or anesthesiologist and see if they can do a nerve block of any sort for you.  Because you received an x-ray today, you will receive an invoice [facility]. Please contact [facility] at [phone] with questions or concerns regarding your invoice. Our billing staff will not be able to assist you with those questions.     Return if symptoms worsen or fail to improve.   Hadi Dubin, MD 08/20/2015

## 2015-08-20 NOTE — Patient Instructions (Addendum)
Increase the gabapentin to 100 mg in the morning and 300 mg at night, then on up to 100/100/300 after 1 week, then gradually go up by 100 mg every 5-7 days if tolerated up to a desired level of 300 mg 3 times daily.  If not getting better relief we may have to make a referral back to the neurosurgeons or anesthesiologist and see if they can do a nerve block of any sort for you.  Because you received an x-ray today, you will receive an invoice from [facility]. Please contact [facility] at [phone] with questions or concerns regarding your invoice. Our billing staff will not be able to assist you with those questions.

## 2015-08-23 ENCOUNTER — Encounter: Payer: Self-pay | Admitting: *Deleted

## 2015-11-18 ENCOUNTER — Other Ambulatory Visit: Payer: Self-pay | Admitting: Family Medicine

## 2015-11-20 ENCOUNTER — Other Ambulatory Visit: Payer: Self-pay

## 2015-11-20 DIAGNOSIS — R0789 Other chest pain: Secondary | ICD-10-CM

## 2015-11-20 MED ORDER — GABAPENTIN 300 MG PO CAPS: ORAL_CAPSULE | ORAL | Status: DC

## 2015-11-20 MED ORDER — TRAMADOL HCL 50 MG PO TABS: 50.0000 mg | ORAL_TABLET | Freq: Four times a day (QID) | ORAL | Status: DC | PRN

## 2015-11-20 NOTE — Telephone Encounter (Signed)
Pt called and reported that she has an appt next week (Tues) with a [place] in [place] area and would like RFs below to cover her until then. Can someone please address this in Dr Hopper's absence?

## 2015-11-20 NOTE — Telephone Encounter (Signed)
Patient is having a lot of rib pain from nerve damage and states it feels like somone is stabbing her. She is calling to check on the status of the refill request and is in need of her medication today. Please advise!  [phone]

## 2015-11-20 NOTE — Telephone Encounter (Signed)
20 pills of gabapentin (NEURONTIN) 300 MG capsule  and tramadol for nights   [phone]

## 2015-11-21 ENCOUNTER — Other Ambulatory Visit: Payer: Self-pay | Admitting: Physician Assistant

## 2015-11-21 ENCOUNTER — Other Ambulatory Visit: Payer: Self-pay | Admitting: Family Medicine

## 2015-11-23 NOTE — Telephone Encounter (Signed)
Done . Duplicate message .

## 2015-11-23 NOTE — Telephone Encounter (Signed)
Faxed tramadol. Pt notified on Vm.

## 2016-01-15 ENCOUNTER — Ambulatory Visit (INDEPENDENT_AMBULATORY_CARE_PROVIDER_SITE_OTHER): Payer: BC Managed Care – PPO | Admitting: Physician Assistant

## 2016-01-15 ENCOUNTER — Encounter: Payer: Self-pay | Admitting: Physician Assistant

## 2016-01-15 ENCOUNTER — Ambulatory Visit: Payer: BC Managed Care – PPO | Admitting: Podiatry

## 2016-01-15 VITALS — BP 138/84 | HR 104 | Temp 98.1°F | Resp 16 | Ht 68.5 in | Wt 165.8 lb

## 2016-01-15 DIAGNOSIS — L089 Local infection of the skin and subcutaneous tissue, unspecified: Secondary | ICD-10-CM

## 2016-01-15 MED ORDER — ONDANSETRON HCL 4 MG PO TABS: 4.0000 mg | ORAL_TABLET | Freq: Three times a day (TID) | ORAL | 0 refills | Status: DC | PRN

## 2016-01-15 MED ORDER — CEPHALEXIN 500 MG PO CAPS: 500.0000 mg | ORAL_CAPSULE | Freq: Two times a day (BID) | ORAL | 0 refills | Status: DC

## 2016-01-15 NOTE — Patient Instructions (Addendum)
Appointment today at [facility] at 3:30pm. [address] [place] [address]    IF you received an x-ray today, you will receive an invoice from [facility]. Please contact [facility] at [phone] with questions or concerns regarding your invoice.   IF you received labwork today, you will receive an invoice from [employer]. Please contact [facility] at [phone] with questions or concerns regarding your invoice.   Our billing staff will not be able to assist you with questions regarding bills from these companies.  You will be contacted with the lab results as soon as they are available. The fastest way to get your results is to activate your My Chart account. Instructions are located on the last page of this paperwork. If you have not heard from [place] regarding the results in 2 weeks, please contact this office.

## 2016-01-15 NOTE — Progress Notes (Signed)
01/15/2016 10:55 AM   DOB: Oct 24, 1955 / MRN: [id number]  SUBJECTIVE:  Shelly Mcdaniel is a 60 y.o. female with a history of uncontrolled diabetes presenting for toe pain. Reports she damaged the nail a few weeks ago and since the toe became swollen and painful.  She soaked the toe and this did help somewhat.  Reports that 2-3 days ago she began having pus come from under the nail.   She associates mild nausea.  Last A1c at Dr. Laverle Hobby office was roughly 9 per patient. She is going to [employer] tomorrow and staying for 4 days.    She is allergic to bee venom; codeine; lipitor [atorvastatin]; and septra [sulfamethoxazole-trimethoprim].   She  has a past medical history of Diabetes mellitus without complication (HCC); Hyperlipidemia; and Hypertension.    She  reports that she has never smoked. She has never used smokeless tobacco. She reports that she does not drink alcohol or use drugs. She  has no sexual activity history on file. The patient  has a past surgical history that includes Breast surgery; Back surgery; and Colonoscopy with propofol (N/A, 12/24/2013).  Her family history includes CAD in her brother and father; Dementia in her mother; Diabetes in her brother, father, other, and sister; Heart disease in her other; Hyperlipidemia in her other; Hypertension in her other; Kidney failure in her brother; Stroke in her brother, father, mother, and other.  Review of Systems  Constitutional: Negative for chills and diaphoresis.  Cardiovascular: Negative for chest pain.  Gastrointestinal: Positive for nausea.  Genitourinary: Negative for dysuria.  Skin: Positive for rash. Negative for itching.  Neurological: Negative for weakness and headaches.    The problem list and medications were reviewed and updated by myself where necessary and exist elsewhere in the encounter.   OBJECTIVE:  BP 138/84   Pulse (!) 104   Temp 98.1 F (36.7 C) (Oral)   Resp 16   Ht 5' 8.5" (1.74 m)   Wt 165 lb  12.8 oz (75.2 kg)   SpO2 94%   BMI 24.84 kg/m   Physical Exam  Constitutional: She is oriented to person, place, and time.  Cardiovascular: Normal rate and regular rhythm.   Pulmonary/Chest: Effort normal and breath sounds normal.  Musculoskeletal: Normal range of motion.       Feet:  Neurological: She is alert and oriented to person, place, and time.  Skin: Skin is warm and dry.  Psychiatric: She has a normal mood and affect.     Lab Results  Component Value Date   HGBA1C 14.1 (H) 11/08/2014     No results found for this or any previous visit (from the past 72 hour(s)).  No results found.  ASSESSMENT AND PLAN  Yasamin was seen today for other.  Diagnoses and all orders for this visit:  Toe infection: Options discussed. Given history of diabetes it makes sense that she see a podiatrist today for further exam. I will go ahead and start keflex.   -     cephALEXin (KEFLEX) 500 MG capsule; Take 1 capsule (500 mg total) by mouth 2 (two) times daily. -     ondansetron (ZOFRAN) 4 MG tablet; Take 1 tablet (4 mg total) by mouth every 8 (eight) hours as needed for nausea or vomiting. -     Ambulatory referral to Podiatry    The patient is advised to call or return to clinic if she does not see an improvement in symptoms, or to seek the care  of the closest emergency department if she worsens with the above plan.   Deliah Boston, MHS, PA-C [facility] 01/15/2016 10:55 AM

## 2016-01-17 LAB — WOUND CULTURE
GRAM STAIN: NONE SEEN
Gram Stain: NONE SEEN
Organism ID, Bacteria: NORMAL

## 2016-01-22 ENCOUNTER — Ambulatory Visit: Payer: BC Managed Care – PPO | Admitting: Podiatry

## 2016-02-18 ENCOUNTER — Encounter: Payer: Self-pay | Admitting: Rehabilitation

## 2016-02-18 ENCOUNTER — Ambulatory Visit: Payer: BC Managed Care – PPO | Attending: Physical Medicine and Rehabilitation | Admitting: Rehabilitation

## 2016-02-18 DIAGNOSIS — R293 Abnormal posture: Secondary | ICD-10-CM | POA: Diagnosis present

## 2016-02-18 DIAGNOSIS — M79602 Pain in left arm: Secondary | ICD-10-CM | POA: Insufficient documentation

## 2016-02-18 DIAGNOSIS — R42 Dizziness and giddiness: Secondary | ICD-10-CM | POA: Insufficient documentation

## 2016-02-18 DIAGNOSIS — M25512 Pain in left shoulder: Secondary | ICD-10-CM | POA: Insufficient documentation

## 2016-02-18 DIAGNOSIS — I69354 Hemiplegia and hemiparesis following cerebral infarction affecting left non-dominant side: Secondary | ICD-10-CM | POA: Insufficient documentation

## 2016-02-18 DIAGNOSIS — R2689 Other abnormalities of gait and mobility: Secondary | ICD-10-CM | POA: Diagnosis present

## 2016-02-18 DIAGNOSIS — R2681 Unsteadiness on feet: Secondary | ICD-10-CM

## 2016-02-18 DIAGNOSIS — R41842 Visuospatial deficit: Secondary | ICD-10-CM | POA: Diagnosis present

## 2016-02-18 DIAGNOSIS — R278 Other lack of coordination: Secondary | ICD-10-CM | POA: Insufficient documentation

## 2016-02-18 NOTE — Therapy (Signed)
[facility] [address] [place], [place], [address] Phone: [phone]   Fax:  [phone]  Physical Therapy Evaluation  Patient Details  Name: Shelly Mcdaniel MRN: [id number] Date of Birth: 11/17/1955 Referring Provider: Lubertha South, MD  Encounter Date: 02/18/2016      PT End of Session - 02/18/16 1132    Visit Number 1   Number of Visits 21  including eval   Date for PT Re-Evaluation 04/18/16   Authorization Type BCBS 0 visit limit, 0 auth   PT Start Time [phone]   PT Stop Time 0933   PT Time Calculation (min) 46 min   Activity Tolerance Patient tolerated treatment well   Behavior During Therapy [facility] for tasks assessed/performed      Past Medical History:  Diagnosis Date  . Diabetes mellitus without complication (HCC)   . Hyperlipidemia   . Hypertension     Past Surgical History:  Procedure Laterality Date  . BACK SURGERY    . BREAST SURGERY    . COLONOSCOPY WITH PROPOFOL N/A 12/24/2013   Procedure: COLONOSCOPY WITH PROPOFOL;  Surgeon: Charolett Bumpers, MD;  Location: [facility];  Service: Endoscopy;  Laterality: N/A;    There were no vitals filed for this visit.       Subjective Assessment - 02/18/16 0853    Subjective "I want to get stronger and be able to walk.  I've started to get my L leg, but nothing other than my shoulder in the arm, but I want the arm left back."   Patient is accompained by: Family member  [name]   Limitations House hold activities;Walking   Currently in Pain? Yes   Pain Score 2    Pain Location Flank   Pain Orientation Left   Pain Descriptors / Indicators Aching   Pain Type Acute pain   Pain Radiating Towards pain from UTI   Pain Onset In the past 7 days   Pain Frequency Intermittent            [facility] PT Assessment - 02/18/16 0856      Assessment   Medical Diagnosis CVA  R Pontine CVA   Referring Provider Huston Foley Shuping, MD   Onset Date/Surgical Date  01/16/16   Hand Dominance Right   Prior Therapy Acute  inpatient rehab at [facility]     Precautions   Precautions Fall   Required Braces or Orthoses Other Brace/Splint   Other Brace/Splint L AFO     Restrictions   Other Position/Activity Restrictions Has splint for LUE at night     Balance Screen   Has the patient fallen in the past 6 months No   Has the patient had a decrease in activity level because of a fear of falling?  Yes   Is the patient reluctant to leave their home because of a fear of falling?  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   Leisure beach, play with dogs, cook, cross Systems developerstich     Cognition   Overall Cognitive Status Within Functional Limits for tasks assessed     Sensation   Light Touch Appears Intact   Proprioception Appears Intact     Coordination   Gross Motor Movements are Fluid and Coordinated No   Fine Motor Movements are Fluid and Coordinated No   Coordination and Movement Description decreased due to strength     Posture/Postural Control   Posture/Postural Control Postural limitations   Postural Limitations Flexed trunk;Posterior pelvic tilt   Posture Comments Pt tends to keep weight shifted to the R in sitting and standing     ROM / Strength   AROM / PROM / Strength Strength     Strength   Overall Strength Deficits   Overall Strength Comments hip flex 2/5, knee ext 2+/5, knee ext  2-/5, ankle DF 0/5, PF 0-1/5, R LE WFL     Transfers   Transfers Sit to Stand;Stand to Sit   Sit to Stand 3: Mod assist;With upper extremity assist   Sit to Stand Details Manual facilitation for weight bearing;Manual facilitation for weight shifting;Verbal cues for sequencing;Verbal cues for technique;Verbal cues for precautions/safety   Stand to Sit 4: Min assist;With upper extremity assist   Stand to Sit Details (indicate cue type and reason) Manual facilitation for weight bearing;Manual facilitation for weight shifting;Verbal cues for technique;Verbal cues for sequencing;Verbal cues for precautions/safety     Ambulation/Gait   Ambulation/Gait Yes   Ambulation/Gait Assistance 3: Mod assist   Ambulation/Gait Assistance Details Pt ambulates during session with her personal hemi walker (note that they are not walking at home yet).  Provided mod A with facilitation for upright posture, increased L forward/lateral weight shift and increased L hip protraction.  Cues for decreased L step length and increased R step length.     Ambulation Distance (Feet) 100 Feet   Assistive device Hemi-walker  L AFO   Gait Pattern Step-to pattern;Step-through pattern;Decreased arm swing - left;Decreased stride length;Decreased stance time - left;Decreased step length - right;Decreased hip/knee flexion - left;Decreased dorsiflexion - left;Lateral trunk lean to right;Trunk flexed   Ambulation Surface Level;Indoor   Gait velocity --  will address as appropriate                           PT Education - 02/18/16 1131    Education provided Yes   Education Details Education on evaluation findings, beginning to stand at home at mirror for midline orientation and begin to perform lateral weight shifts with upright posture (husband to assist)   Person(s) Educated Patient;Spouse   Methods Explanation;Demonstration   Comprehension Verbalized understanding;Returned demonstration          PT Short  Term Goals - 02/18/16 1140      PT SHORT TERM GOAL #1   Title Pt will initiate HEP in order to indicate improved functional mobility and decreased fall risk.  (Target Date: 03/17/16)   Time 4   Period Weeks   Status New     PT SHORT TERM GOAL #2   Title Pt will perform stand pivot transfer w/ LRAD at S level in order to increase independence at home.     Time 4   Period Weeks   Status New     PT SHORT TERM GOAL #3   Title Pt will perform  bed mobility at S level in order to indicate improved functional independence.     Time 4   Period Weeks   Status New     PT SHORT TERM GOAL #4   Title Pt will ambulate w/ LRAD x 150' at min A level in order to indicate improved household ambulation.     Time 4   Period Weeks   Status New     PT SHORT TERM GOAL #5   Title Pt will perform PASS and improve score 3 points in order to indicate improved functional mobility.     Time 4   Period Weeks   Status New           PT Long Term Goals - 02/18/16 1149      PT LONG TERM GOAL #1   Title Pt/spouse will be independent with HEP in order to indicate improved functional mobility and decreased fall risk.  (Target Date: 04/14/16)   Time 8   Period Weeks   Status New     PT LONG TERM GOAL #2   Title Pt will perform stand pivot transfers w/ LRAD at mod I level in order to indicate improved functional independence.    Time 8   Period Weeks   Status New     PT LONG TERM GOAL #3   Title Pt will perform bed mobility at mod I level in order to indicate improved functional mobility.     Time 8   Period Weeks   Status New     PT LONG TERM GOAL #4   Title Pt will ambulate x 150' w/ LRAD at mod I level over indoor surfaces to indicate increased independence with household ambulation.     Time 8   Period Weeks   Status New     PT LONG TERM GOAL #5   Title Pt will ambulate over paved outdoor surfaces w/ LRAD x 200' at S level in order to indicate initiation of community ambulation.    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  Note history of uncontrolled DM with recent toe injury (is clearing) and HTN that may effect progress with PT.  Upon PT evaluation, note that pt requires mod A for sit<>stand with hemi walker and L AFO as well as mod A with gait x 100' with hemi walker and L AFO.  She tends to demonstrate increased R lateral trunk lean during gait and static mobility.  Pt is of evolving presentation and moderate complexity per PT POC standpoint.  Pt will greatly benefit from skilled OP neuro PT to address deficits.     Rehab Potential Excellent   Clinical Impairments Affecting Rehab Potential Pt very motivated and able to attend therapy 3x/wk   PT Frequency 3x / week  then 2x/wk for 4 weeks   PT Duration 4 weeks  then 2x/wk for 4 weeks   PT Treatment/Interventions ADLs/Self Care Home Management;Canalith Repostioning;Electrical Stimulation;DME Instruction;Stair training;Gait training;Functional mobility training;Therapeutic activities;Therapeutic exercise;Balance training;Neuromuscular re-education;Patient/family education;Orthotic Fit/Training;Passive range of motion;Energy conservation;Taping;Vestibular;Visual/perceptual remediation/compensation   PT Next Visit Plan Perform Pass-add goal, address gait with husband-can they do this at home, HEP for LLE strengthening, sit<>stand, L LE NMR  Consulted and Agree with Plan of Care Patient;Family member/caregiver   Family Member Consulted Husband [name]      Patient will benefit from skilled therapeutic intervention in order to improve the following deficits and impairments:  Abnormal gait, Decreased activity tolerance, Decreased balance, Decreased coordination, Decreased endurance, Decreased  knowledge of use of DME, Decreased mobility, Decreased strength, Dizziness, Impaired perceived functional ability, Impaired flexibility, Impaired tone, Impaired UE functional use, Impaired vision/preception, Improper body mechanics, Postural dysfunction  Visit Diagnosis: Hemiplegia and hemiparesis following cerebral infarction affecting left non-dominant side (HCC) - Plan: PT plan of care cert/re-cert  Unsteadiness on feet - Plan: PT plan of care cert/re-cert  Other lack of coordination - Plan: PT plan of care cert/re-cert  Other abnormalities of gait and mobility - Plan: PT plan of care cert/re-cert  Abnormal posture - Plan: PT plan of care cert/re-cert  Dizziness and giddiness - Plan: PT plan of care cert/re-cert     Problem List Patient Active Problem List   Diagnosis Date Noted  . 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 TIA (transient ischemic attack) 11/07/2014  . Hypertension 11/07/2014  . DM II (diabetes mellitus, type II), controlled (HCC) 11/07/2014    Shanik Brookshire, Meribeth Mattes 02/18/2016, 11:59 AM  [facility] [address] [place], [place], [address] Phone: [phone]   Fax:  [phone]  Name: Shelly Mcdaniel MRN: [id number] Date of Birth: 1955-07-24

## 2016-02-19 ENCOUNTER — Encounter: Payer: Self-pay | Admitting: Rehabilitation

## 2016-02-19 ENCOUNTER — Ambulatory Visit: Payer: BC Managed Care – PPO | Admitting: Rehabilitation

## 2016-02-19 DIAGNOSIS — R2689 Other abnormalities of gait and mobility: Secondary | ICD-10-CM

## 2016-02-19 DIAGNOSIS — R2681 Unsteadiness on feet: Secondary | ICD-10-CM

## 2016-02-19 DIAGNOSIS — I69354 Hemiplegia and hemiparesis following cerebral infarction affecting left non-dominant side: Secondary | ICD-10-CM

## 2016-02-19 DIAGNOSIS — R278 Other lack of coordination: Secondary | ICD-10-CM

## 2016-02-19 DIAGNOSIS — R293 Abnormal posture: Secondary | ICD-10-CM

## 2016-02-19 DIAGNOSIS — R42 Dizziness and giddiness: Secondary | ICD-10-CM

## 2016-02-19 NOTE — Patient Instructions (Signed)
At home:   1:  Work on sitting and focusing on improved posture.  You can use a towel roll at your low back or the kidney pillow that you talked about.  Make sure that your Left arm is supported in good resting position.  Don't add too many pillows as this will be uncomfortable for your shoulder and cause mal-alignment.  Also when you are in bed lying on your back, ensure that your left arm is supported beside you and you may need small towel under shoulder for support.    2:  Sitting in w/c or chair, work on moving your head and eyes up and down and side to side.  Gauge your dizziness.  If you start exercises at 4/10 dizziness, ensure that you do not get above 6 (no more than 2 points higher than you start) or else stop and take a break and focus on target in front of you that is not moving.    3:  Standing in front of sink or mirror in bathroom (kitchen) and working on standing tall with equal weight on each leg and then shifting R and L to increase weight on left leg.  Repeat x 10 reps and then hold in the middle x 2 mins while trying to take your R hand away from counter, but have [name] with you.

## 2016-02-19 NOTE — Therapy (Signed)
[facility] [address] [place], [place], [address] Phone: [phone]   Fax:  [phone]  Physical Therapy Treatment  Patient Details  Name: Shelly SimmerJody D Duignan MRN: [id number] Date of Birth: June 01, 1956 Referring Provider: Lubertha SouthLee Thomas Shuping, MD  Encounter Date: 02/19/2016      PT End of Session - 02/19/16 1651    Visit Number 2   Number of Visits 21  including eval   Date for PT Re-Evaluation 04/18/16   Authorization Type BCBS 0 visit limit, 0 auth   PT Start Time 1405   PT Stop Time 1500   PT Time Calculation (min) 55 min   Activity Tolerance Patient tolerated treatment well   Behavior [facility] Therapy [facility] for tasks assessed/performed      Past Medical History:  Diagnosis Date  . Diabetes mellitus without complication (HCC)   . Hyperlipidemia   . Hypertension     Past Surgical History:  Procedure Laterality Date  . BACK SURGERY    . BREAST SURGERY    . COLONOSCOPY WITH PROPOFOL N/A 12/24/2013   Procedure: COLONOSCOPY WITH PROPOFOL;  Surgeon: Charolett BumpersMartin K Johnson, MD;  Location: [facility];  Service: Endoscopy;  Laterality: N/A;    There were no vitals filed for this visit.      Subjective Assessment - 02/19/16 1644    Subjective "I've been busy today because I saw my Internist today."     Patient is accompained by: Family member   Limitations House hold activities;Walking   Currently in Pain? No/denies            [facility] PT Assessment - 02/18/16 0856      Assessment   Medical Diagnosis CVA  R Pontine CVA   Referring Provider Huston FoleyLee Thomas Shuping, MD   Onset Date/Surgical Date 01/16/16   Hand Dominance Right   Prior [facility]     Precautions   Precautions Fall   Required Braces or Orthoses Other Brace/Splint   Other Brace/Splint L AFO     Restrictions   Other Position/Activity Restrictions Has splint for LUE at night     Balance Screen   Has the  patient fallen in the past 6 months No   Has the patient had a decrease in activity level because of a fear of falling?  Yes   Is the patient reluctant to leave their home because of a fear of falling?  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   Leisure beach, play with dogs, cook, cross Systems developerstich     Cognition   Overall Cognitive Status Within Functional Limits for tasks assessed     Sensation   Light Touch Appears Intact   Proprioception Appears Intact     Coordination   Gross Motor Movements are Fluid and Coordinated No   Fine Motor Movements are Fluid  and Coordinated No   Coordination and Movement Description decreased due to strength     Posture/Postural Control   Posture/Postural Control Postural limitations   Postural Limitations Flexed trunk;Posterior pelvic tilt   Posture Comments Pt tends to keep weight shifted to the R in sitting and standing     ROM / Strength   AROM / PROM / Strength Strength     Strength   Overall Strength Deficits   Overall Strength Comments hip flex 2/5, knee ext 2+/5, knee ext 2-/5, ankle DF 0/5, PF 0-1/5, R LE WFL     Transfers   Transfers Sit to Stand;Stand to Sit   Sit to Stand 3: Mod assist;With upper extremity assist   Sit to Stand Details Manual facilitation for weight bearing;Manual facilitation for weight shifting;Verbal cues for sequencing;Verbal cues for technique;Verbal cues for  precautions/safety   Stand to Sit 4: Min assist;With upper extremity assist   Stand to Sit Details (indicate cue type and reason) Manual facilitation for weight bearing;Manual facilitation for weight shifting;Verbal cues for technique;Verbal cues for sequencing;Verbal cues for precautions/safety     Ambulation/Gait   Ambulation/Gait Yes   Ambulation/Gait Assistance 3: Mod assist   Ambulation/Gait Assistance Details Pt ambulates during session with her personal hemi walker (note that they are not walking at home yet).  Provided mod A with facilitation for upright posture, increased L forward/lateral weight shift and increased L hip protraction.  Cues for decreased L step length and increased R step length.     Ambulation Distance (Feet) 100 Feet   Assistive device Hemi-walker  L AFO   Gait Pattern Step-to pattern;Step-through pattern;Decreased arm swing - left;Decreased stride length;Decreased stance time - left;Decreased step length - right;Decreased hip/knee flexion - left;Decreased dorsiflexion - left;Lateral trunk lean to right;Trunk flexed   Ambulation Surface Level;Indoor   Gait velocity --  will address as appropriate            Vestibular Assessment - 02/19/16 0001      Vestibular Assessment   General Observation L eye malalignment     Symptom Behavior   Type of Dizziness --  nausea/vomiting   Frequency of Dizziness with movement   Aggravating Factors Activity in general;Moving eyes   Relieving Factors Closing eyes;Lying supine;Head stationary     Occulomotor Exam   Occulomotor Alignment Abnormal   Spontaneous Absent   Gaze-induced Left beating nystagmus with L gaze   Smooth Pursuits Saccades   Saccades Dysmetria     Vestibulo-Occular Reflex   VOR Cancellation Unable to maintain gaze       NMR:  See vestibular assessment above.  Provided pt with visual targeting for compensation when moving at home (functionally) and provided pt with head turns side/side and  up/down for adaptation exercise at home, see pt instruction for details.    Self Care:  Extensive discussion regarding posture and positioning at home.  Demonstrated and educated on LUE positioning when in chair as well as when in bed (whether on side or back).  Also discussed donning/doffing of GiveMohr sling for standing and gait.  Discussed vestibular exercises, standing at home, and increasing wear of shoes and brace at home so that when pt is ambulatory she does not have to put shoes on/off.    Gait:  Performed gait x 85' x 1 with hemi walker and L AFO.  Provided pt with visual feedback via mirror during all of gait in order to work on upright posture, midline orientation and improved weight shift to the  L.  Cues and facilitation for posture at axilla/rib cage, at pelvis for improved L hip retraction and improved forward lateral weight shift over LLE during stance.  Note decreased clearance on RLE with toe catch intermittently, therefore would recommend toe cap.  PT to look into this.  Pt and husband verbalized understanding.  Cues for decreased L step length and increased R step length.  Note marked improvement with visual feedback.                    PT Education - 02/19/16 1651    Education provided Yes   Education Details education on positioning LUE, GiveMohr sling donning, posture, exercises at home, see pt instruction for details.    Person(s) Educated Patient;Spouse   Methods Explanation;Demonstration;Handout   Comprehension Returned demonstration;Verbalized understanding          PT Short Term Goals - 02/18/16 1140      PT SHORT TERM GOAL #1   Title Pt will initiate HEP in order to indicate improved functional mobility and decreased fall risk.  (Target Date: 03/17/16)   Time 4   Period Weeks   Status New     PT SHORT TERM GOAL #2   Title Pt will perform stand pivot transfer w/ LRAD at S level in order to increase independence at home.     Time 4   Period Weeks    Status New     PT SHORT TERM GOAL #3   Title Pt will perform bed mobility at S level in order to indicate improved functional independence.     Time 4   Period Weeks   Status New     PT SHORT TERM GOAL #4   Title Pt will ambulate w/ LRAD x 150' at min A level in order to indicate improved household ambulation.     Time 4   Period Weeks   Status New     PT SHORT TERM GOAL #5   Title Pt will perform PASS and improve score 3 points in order to indicate improved functional mobility.     Time 4   Period Weeks   Status New           PT Long Term Goals - 02/18/16 1149      PT LONG TERM GOAL #1   Title Pt/spouse will be independent with HEP in order to indicate improved functional mobility and decreased fall risk.  (Target Date: 04/14/16)   Time 8   Period Weeks   Status New     PT LONG TERM GOAL #2   Title Pt will perform stand pivot transfers w/ LRAD at mod I level in order to indicate improved functional independence.    Time 8   Period Weeks   Status New     PT LONG TERM GOAL #3   Title Pt will perform bed mobility at mod I level in order to indicate improved functional mobility.     Time 8   Period Weeks   Status New     PT LONG TERM GOAL #4   Title Pt will ambulate x 150' w/ LRAD at mod I level over indoor surfaces to indicate increased independence with household ambulation.     Time 8   Period Weeks   Status New     PT LONG TERM GOAL #5   Title Pt will ambulate over paved outdoor surfaces w/ LRAD x 200' at S level in order to indicate initiation of community  ambulation.    Time 8   Period Weeks   Status New               Plan - 02/19/16 1651    Clinical Impression Statement Skilled session focused on vestibular assessment in order to better understand deficits and provide pt with compensatory strategy as well as beginning adaptation exercises for home.  Also educated on positioning for LUE, use of GiveMohr sling, posture at home, and gait with hemi  walker.     Rehab Potential Excellent   Clinical Impairments Affecting Rehab Potential Pt very motivated and able to attend therapy 3x/wk   PT Frequency 3x / week  then 2x/wk for 4 weeks   PT Duration 4 weeks  then 2x/wk for 4 weeks   PT Treatment/Interventions ADLs/Self Care Home Management;Canalith Repostioning;Electrical Stimulation;DME Instruction;Stair training;Gait training;Functional mobility training;Therapeutic activities;Therapeutic exercise;Balance training;Neuromuscular re-education;Patient/family education;Orthotic Fit/Training;Passive range of motion;Energy conservation;Taping;Vestibular;Visual/perceptual remediation/compensation   PT Next Visit Plan Perform Pass-add goal, address gait with husband-can they do this at home, HEP for LLE strengthening, sit<>stand, L LE NMR   Consulted and Agree with Plan of Care Patient;Family member/caregiver   Family Member Consulted Husband [name]      Patient will benefit from skilled therapeutic intervention in order to improve the following deficits and impairments:  Abnormal gait, Decreased activity tolerance, Decreased balance, Decreased coordination, Decreased endurance, Decreased knowledge of use of DME, Decreased mobility, Decreased strength, Dizziness, Impaired perceived functional ability, Impaired flexibility, Impaired tone, Impaired UE functional use, Impaired vision/preception, Improper body mechanics, Postural dysfunction  Visit Diagnosis: Hemiplegia and hemiparesis following cerebral infarction affecting left non-dominant side (HCC)  Unsteadiness on feet  Other lack of coordination  Other abnormalities of gait and mobility  Abnormal posture  Dizziness and giddiness     Problem List Patient Active Problem List   Diagnosis Date Noted  . 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 TIA (transient ischemic attack) 11/07/2014  . Hypertension 11/07/2014  . DM II (diabetes mellitus, type II), controlled (HCC) 11/07/2014    Harriet Butte, PT,  MPT [facility] [address] [place], [place], [address] Phone: [phone]   Fax:  [phone] 02/19/16, 4:55 PM  Name: MIRELLE BISKUP MRN: [id number] Date of Birth: 03-02-1956

## 2016-02-22 ENCOUNTER — Ambulatory Visit: Payer: BC Managed Care – PPO | Admitting: Rehabilitation

## 2016-02-24 ENCOUNTER — Encounter: Payer: Self-pay | Admitting: Physical Therapy

## 2016-02-24 ENCOUNTER — Ambulatory Visit: Payer: BC Managed Care – PPO | Admitting: Physical Therapy

## 2016-02-24 DIAGNOSIS — I69354 Hemiplegia and hemiparesis following cerebral infarction affecting left non-dominant side: Secondary | ICD-10-CM

## 2016-02-24 DIAGNOSIS — R2689 Other abnormalities of gait and mobility: Secondary | ICD-10-CM

## 2016-02-24 DIAGNOSIS — R42 Dizziness and giddiness: Secondary | ICD-10-CM

## 2016-02-24 DIAGNOSIS — R2681 Unsteadiness on feet: Secondary | ICD-10-CM

## 2016-02-24 NOTE — Patient Instructions (Signed)
If you ever experience vertigo that is constant, occurs at rest, or does not change with head/body movement (getting in and out of bed), seek immediate medical attention.    Tip Card 1.The goal of habituation training is to assist in decreasing symptoms of vertigo, dizziness, or nausea provoked by specific head and body motions. 2.These exercises may initially increase symptoms; however, be persistent and work through symptoms. With repetition and time, the exercises will assist in reducing or eliminating symptoms. 3.Exercises should be stopped and discussed with the therapist if you experience any of the following: - Sudden change or fluctuation in hearing - New onset of ringing in the ears, or increase in current intensity - Any fluid discharge from the ear - Severe pain in neck or back - Extreme nausea  Copyright  VHI. All rights reserved.  Rolling   With pillow under head, start on back. Roll to your right side.  Hold until dizziness stops, plus 20 seconds and then roll to the left side.  Hold until dizziness stops, plus 20 seconds.  Repeat sequence 5 times per session. Do 2 sessions per day.  Copyright  VHI. All rights reserved.

## 2016-02-24 NOTE — Therapy (Signed)
[facility] [address] [place], [place], [address] Phone: [phone]   Fax:  [phone]  Physical Therapy Treatment  Patient Details  Name: Shelly Mcdaniel MRN: [id number] Date of Birth: 04-02-1956 Referring Provider: Lubertha South, MD  Encounter Date: 02/24/2016      PT End of Session - 02/24/16 1333    Visit Number 3   Number of Visits 21  including eval   Date for PT Re-Evaluation 04/18/16   Authorization Type BCBS 0 visit limit, 0 auth   PT Start Time 1103   PT Stop Time 1152   PT Time Calculation (min) 49 min   Activity Tolerance Other (comment)  Limited by nausea   Behavior During Therapy [facility] for tasks assessed/performed      Past Medical History:  Diagnosis Date  . Diabetes mellitus without complication (HCC)   . Hyperlipidemia   . Hypertension     Past Surgical History:  Procedure Laterality Date  . BACK SURGERY    . BREAST SURGERY    . COLONOSCOPY WITH PROPOFOL N/A 12/24/2013   Procedure: COLONOSCOPY WITH PROPOFOL;  Surgeon: Charolett Bumpers, MD;  Location: [facility];  Service: Endoscopy;  Laterality: N/A;    There were no vitals filed for this visit.      Subjective Assessment - 02/24/16 1110    Subjective Pt states that she feels like "things are floating to the left" and reports occasional vomiting recently.  She also reports falling out of the bed 2 days ago, during which pt struck head on floor. Pt denies LOC. Pt has experienced room-spinning dizziness since said fall. States, "My daughter (physician) says I probably need to Epley maneuver."   Patient is accompained by: Family member  husband, [name]   Limitations House hold activities;Walking   Currently in Pain? No/denies   Pain Onset In the past 7 days   Multiple Pain Sites No                Vestibular Assessment - 02/24/16 0001      Vestibular Assessment   General Observation New scopolamine patch on today; took  zofran this morning to prevent nausea during PT.  Describes room-spinning dizziness (worst with rolling in bed) since pt hit head 2 days ago when pt fell OOB.     Symptom Behavior   Type of Dizziness Spinning   Aggravating Factors Rolling to right;Rolling to left   Relieving Factors Closing eyes;Head stationary     Occulomotor Exam   Occulomotor Alignment Abnormal  impaired L eye alignment (premorbid)   Gaze-induced Left beating nystagmus with L gaze     Positional Testing   Dix-Hallpike Dix-Hallpike Left   Sidelying Test Sidelying Left;Sidelying Right   Horizontal Canal Testing Horizontal Canal Right;Horizontal Canal Left;Horizontal Canal Right Intensity;Horizontal Canal Left Intensity     Dix-Hallpike Left   Dix-Hallpike Left Duration 10 seconds   Dix-Hallpike Left Symptoms Upbeat, left rotatory nystagmus     Sidelying Right   Sidelying Right Duration No nystagmus visible but pt reports vertiginous symptoms x10 sec   Sidelying Right Symptoms Left nystagmus;No nystagmus     Sidelying Left   Sidelying Left Duration Until position changed (approx 20 seconds)   Sidelying Left Symptoms Upbeat, left rotatory nystagmus     Horizontal Canal Right   Horizontal Canal Right Duration Approx 10 seconds   Horizontal Canal Right Symptoms Other (comment)  L-beating nystagmus     Horizontal Canal Left   Horizontal Canal  Left Duration Approx. 15-20 seconds L-beating nystagmus   Horizontal Canal Left Symptoms Other (comment)  L beating nystagmus     Horizontal Canal Right Intensity   Horizontal Canal Right Intensity Moderate     Horizontal Canal Left Intensity   Horizontal Canal Left Intensity Severe                 [facility] Adult PT Treatment/Exercise - 02/24/16 0001      Transfers   Transfers Stand Pivot Transfers   Stand Pivot Transfers 3: Mod assist   Stand Pivot Transfer Details (indicate cue type and reason) manual facilitation of weight shifting in standing      Self-Care   Self-Care Other Self-Care Comments   Other Self-Care Comments  Explained that BPPV/motion sensitivity may take longer to resolve given 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  HEP: horizontal rolling for habituation; see Pt Instructions for details. Recommended husband provide close (S) during rolling to prevent another fall OOB. Explained situations in which to seek immediate medical attention in the presence of vertigo; see Pt Instructions for details.  Reiterated use of visual targeting to mitigate symptoms, prevent cumulative dizziness of central origin.         Vestibular Treatment/Exercise - 02/24/16 0001      Vestibular Treatment/Exercise   Vestibular Treatment Provided Canalith Repositioning;Habituation   Canalith Repositioning Epley Manuever Left   Habituation Exercises Horizontal Roll      EPLEY MANUEVER LEFT   Number of Reps  1   Overall Response  Improved Symptoms    RESPONSE DETAILS LEFT Pt reports "I feel better" after L Epley x1; however, reassessment of L Sidelying Test (+) but with pure horizontal L-beating nystagmus     Horizontal Roll   Number of Reps  1   Symptom Description  Stopped after initial rep due to onset of nausea.               PT Education - 02/24/16 1312    Education provided Yes   Education Details See Self Care section for all education provided on BPPV, habituation HEP, and compensation for central vestibular impairments.     Person(s) Educated Spouse;Patient   Methods Explanation;Handout   Comprehension Verbalized understanding          PT Short Term Goals - 02/18/16 1140      PT SHORT TERM GOAL #1   Title Pt will initiate HEP in order to indicate improved functional mobility and decreased fall risk.  (Target Date: 03/17/16)   Time 4   Period Weeks   Status New     PT SHORT TERM GOAL #2   Title Pt will perform stand pivot transfer w/ LRAD at S level in order to increase independence at home.      Time 4   Period Weeks   Status New     PT SHORT TERM GOAL #3   Title Pt will perform bed mobility at S level in order to indicate improved functional independence.     Time 4   Period Weeks   Status New     PT SHORT TERM GOAL #4   Title Pt will ambulate w/ LRAD x 150' at min A level in order to indicate improved household ambulation.     Time 4   Period Weeks   Status New     PT SHORT TERM GOAL #5   Title Pt will perform PASS and improve score 3 points in  order to indicate improved functional mobility.     Time 4   Period Weeks   Status New           PT Long Term Goals - 02/18/16 1149      PT LONG TERM GOAL #1   Title Pt/spouse will be independent with HEP in order to indicate improved functional mobility and decreased fall risk.  (Target Date: 04/14/16)   Time 8   Period Weeks   Status New     PT LONG TERM GOAL #2   Title Pt will perform stand pivot transfers w/ LRAD at mod I level in order to indicate improved functional independence.    Time 8   Period Weeks   Status New     PT LONG TERM GOAL #3   Title Pt will perform bed mobility at mod I level in order to indicate improved functional mobility.     Time 8   Period Weeks   Status New     PT LONG TERM GOAL #4   Title Pt will ambulate x 150' w/ LRAD at mod I level over indoor surfaces to indicate increased independence with household ambulation.     Time 8   Period Weeks   Status New     PT LONG TERM GOAL #5   Title Pt will ambulate over paved outdoor surfaces w/ LRAD x 200' at S level in order to indicate initiation of community ambulation.    Time 8   Period Weeks   Status New               Plan - 02/24/16 1335    Clinical Impression Statement Pt arrived to session with report of room-spinning dizziness; onset 2 days ago after pt fell OOB and hit head. No LOC, per pt. All positional testing revealed L-beating nystagmus, with exception of L upbeating torsional nystagmus on L Sidelying Test.  Note that pt exhibited L gaze-evoked nystagmus on initial vestibular assessment. Therefore, performed L Epley maneuver x1. Reassessment of L Sidelying Test with L-beating nystagmus with no torsional component. Pt reports improvement of dizziness post-session. Educated pt/husband on horizontal rolling for habituation.    Rehab Potential Excellent   Clinical Impairments Affecting Rehab Potential Pt very motivated and able to attend therapy 3x/wk   PT Frequency 3x / week  then 2x/week for 4 weeks   PT Duration 4 weeks  then 2x/week for 4 weeks   PT Treatment/Interventions ADLs/Self Care Home Management;Canalith Repostioning;Electrical Stimulation;DME Instruction;Stair training;Gait training;Functional mobility training;Therapeutic activities;Therapeutic exercise;Balance training;Neuromuscular re-education;Patient/family education;Orthotic Fit/Training;Passive range of motion;Energy conservation;Taping;Vestibular;Visual/perceptual remediation/compensation   PT Next Visit Plan Reassess for BPPV and treat prn.  Perform Pass-add goal, address gait with husband-can they do this at home, HEP for LLE strengthening, sit<>stand, L LE NMR   Consulted and Agree with Plan of Care Patient;Family member/caregiver   Family Member Consulted Husband [name]      Patient will benefit from skilled therapeutic intervention in order to improve the following deficits and impairments:  Abnormal gait, Decreased activity tolerance, Decreased balance, Decreased coordination, Decreased endurance, Decreased knowledge of use of DME, Decreased mobility, Decreased strength, Dizziness, Impaired perceived functional ability, Impaired flexibility, Impaired tone, Impaired UE functional use, Impaired vision/preception, Improper body mechanics, Postural dysfunction  Visit Diagnosis: Hemiplegia and hemiparesis following cerebral infarction affecting left non-dominant side (HCC)  Dizziness and giddiness  Unsteadiness on feet  Other  abnormalities of gait and mobility     Problem List Patient Active Problem List  Diagnosis Date Noted  . 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 TIA (transient ischemic attack) 11/07/2014  . Hypertension 11/07/2014  . DM II (diabetes mellitus, type II), controlled (HCC) 11/07/2014    Jorje Guild, PT, DPT [facility] [address] [place], [place], [address] Phone: [phone]   Fax:  [phone] 02/24/16, 1:45 PM  Name: Shelly Mcdaniel MRN: [id number] Date of Birth: August 04, 1955

## 2016-02-25 ENCOUNTER — Ambulatory Visit: Payer: BC Managed Care – PPO | Admitting: Occupational Therapy

## 2016-02-25 DIAGNOSIS — R2689 Other abnormalities of gait and mobility: Secondary | ICD-10-CM

## 2016-02-25 DIAGNOSIS — M79602 Pain in left arm: Secondary | ICD-10-CM

## 2016-02-25 DIAGNOSIS — R41842 Visuospatial deficit: Secondary | ICD-10-CM

## 2016-02-25 DIAGNOSIS — M25512 Pain in left shoulder: Secondary | ICD-10-CM

## 2016-02-25 DIAGNOSIS — I69354 Hemiplegia and hemiparesis following cerebral infarction affecting left non-dominant side: Secondary | ICD-10-CM

## 2016-02-25 DIAGNOSIS — R278 Other lack of coordination: Secondary | ICD-10-CM

## 2016-02-25 NOTE — Therapy (Signed)
[facility] [address] [place], [place], [address] Phone: [phone]   Fax:  [phone]  Occupational Therapy Evaluation  Patient Details  Name: Shelly Mcdaniel MRN: [id number] Date of Birth: May 25, 1956 Referring Provider: Dr. Tobie Lords  Encounter Date: 02/25/2016      OT End of Session - 02/25/16 1625    Visit Number 1   Number of Visits 24   Date for OT Re-Evaluation 04/25/16   Authorization Type BC/BS   OT Start Time 1315   OT Stop Time 1405   OT Time Calculation (min) 50 min   Activity Tolerance Patient tolerated treatment well      Past Medical History:  Diagnosis Date  . Diabetes mellitus without complication (HCC)   . Hyperlipidemia   . Hypertension     Past Surgical History:  Procedure Laterality Date  . BACK SURGERY    . BREAST SURGERY    . COLONOSCOPY WITH PROPOFOL N/A 12/24/2013   Procedure: COLONOSCOPY WITH PROPOFOL;  Surgeon: Charolett Bumpers, MD;  Location: [facility];  Service: Endoscopy;  Laterality: N/A;    There were no vitals filed for this visit.      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 subluxation   Patient Stated Goals I want my hand to come back   Currently in Pain? Yes  no pain at rest in proper positioning   Pain Score 6    Pain Location Shoulder   Pain Orientation Left   Pain Descriptors / Indicators Sharp   Pain Type Acute pain   Pain Onset More than a month ago   Pain Frequency Intermittent   Aggravating Factors  P/ROM > midrange   Pain Relieving Factors Pain patches, supporting LUE           [facility] OT Assessment - 02/25/16 0001      Assessment   Diagnosis CVA  Rt pontine stroke   Referring Provider Dr. Nedra Hai Shuping   Onset Date 01/16/16    Assessment in w/c, dense LUE hemiplegia, Lt sh. subluxation, vestibular deficits - central and positional vertigo (per P.T.)   Prior Therapy inpatient rehab at [facility]     Precautions   Precautions Fall   Precaution Comments --  Vestibular deficits/dizziness   Required Braces or Orthoses Other Brace/Splint   Other Brace/Splint L AFO  Lt resting hand splint for pm     Balance Screen   Has the patient fallen in the past 6 months --  see P.T. eval     Home  Environment   Bathroom Shower/Tub Tub/Shower unit;Curtain   Home Equipment Bedside commode;Tub bench  w/c, hemi-walker   Additional Comments Pt lives in 2 story home, but lives on 1st floor. Pt has ramp to enter. 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, sport and exercise, more recently stopped working to attend school full time   Leisure beach, play with dogs, cook, cross stich     ADL   Eating/Feeding Needs assist with cutting food   Grooming Independent  with Rt dominant hand   Upper Body Bathing Minimal assistance  Lower Body Bathing Minimal assistance   Upper Body Dressing Moderate assistance   Lower Body Dressing Maximal assistance   Toilet Tranfer Maximal assistance   Toileting - Clothing Manipulation --  dependent d/t balance   Toileting -  Hygiene Moderate assistance  independent with urination, dependent for bowel movement   Tub/Shower Transfer Moderate assistance   ADL comments dependent for IADLS including financial management     IADL   Medication Management Takes responsibility if medication is prepared in advance in seperate dosage     Mobility   Mobility Status Needs assist   Mobility Status Comments uses w/c primarily     Written Expression   Dominant Hand Right   Handwriting 100% legible     Vision - History   Baseline Vision Wears glasses only  for reading   Additional Comments no depth perception and diplopia at certain angles prior to CVA. 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     Vision Assessment   Eye Alignment Impaired (comment)  Lt eye malaligned (deviates, and difficulty tracking)    Visual Fields --  TBA   Depth Perception no depth perception (pre-morbid) secondary to strabismus Lt eye     Cognition   Overall Cognitive Status --  denies change     Sensation   Light Touch Appears Intact   Additional Comments however, will get cold easily Lt hand     Coordination   Gross Motor Movements are Fluid and Coordinated No   Fine Motor Movements are Fluid and Coordinated No   Coordination no movement LUE     Edema   Edema mild Lt hand     Tone   Assessment Location Left Upper Extremity     ROM / Strength   AROM / PROM / Strength AROM;PROM     AROM   Overall AROM Comments Pt has no A/ROM LUE, except minimal scapula elevation and retraction (approx 25%). Pt has trace sh. flexion and triceps in closed chain AA/ROM, but dominated by IR synergy pattern     PROM   Overall PROM Comments P/ROM elbow distally WFL's. Pt can only tolerate approx. 75* sh. flex, but can tolerate higher in scaption range while seated.      Strength   Overall Strength Comments only trace shoulder/elbow in closed chain low range     Hand Function   Left Hand Grip (lbs) 0     LUE Tone   LUE Tone Hypotonic;Modified Ashworth     LUE Tone   Hypotonic Details At Lt shoulder with subluxation   Modified Ashworth Scale for Grading Hypertonia LUE Slight increase in muscle tone, manifested by a catch and release or by minimal resistance at the end of the range of motion when the affected part(s) is moved in flexion or extension  1/4 elbow flexors and wrist flexors, 2/4 finger flexors                           OT Short Term Goals - 02/25/16 1632      OT SHORT TERM GOAL #1   Title Pt/family independent with initial HEP for LUE  (All STG's due 03/26/16)    Time 4   Period Weeks   Status New     OT SHORT TERM GOAL #2   Title Pt/family to verbalize understanding with proper positioning of LUE during activity and in bed to prevent pain including use of sling prn   Time 4  Period Weeks   Status New     OT SHORT TERM GOAL #3   Title Pt to perform UE dressing with min assist using hemi techniques   Baseline mod assist   Time 4   Period Weeks   Status New     OT SHORT TERM GOAL #4   Title Pt to perform functional stand pivot transfer for toileting with no more than mod assist    Time 4   Period Weeks   Status New     OT SHORT TERM GOAL #5   Title Pt to be mod assist with LE dressing using hemi techniques and A/E PRN   Time 4   Period Weeks   Status New           OT Long Term Goals - 02/25/16 1637      OT LONG TERM GOAL #1   Title Independent with updated HEP prn (all LTG's due 04/25/16)    Time 8   Period Weeks   Status New     OT LONG TERM GOAL #2   Title Pt to demo active low range sh. flexion to 25 degrees in prep for reaching and use of arm as stabalizer   Time 8   Period Weeks   Status New     OT LONG TERM GOAL #3   Title Pt to demo 50% gross finger flexion/extension to grasp/release cylindrical objects   Time 8   Period Weeks   Status New     OT LONG TERM GOAL #4   Title Pt to be supervision level with all BADLS   Time 8   Period Weeks   Status New     OT LONG TERM GOAL #5   Title Vision goal TBD    Time 8   Period Weeks   Status New               Plan - 02/25/16 1626    Clinical Impression Statement Pt is a 60 y.o. female who presents to outpatient rehab for moderately complex O.T. evaluation s/p CVA with Lt non dominant sided hemiplegia, and no active movement LUE. 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    Rehab Potential Good   OT Frequency --  2-3x/week (as pt can tolerate and as schedule allows)    OT Duration 8 weeks   OT Treatment/Interventions Self-care/ADL training;Moist Heat;DME and/or AE instruction;Splinting;Patient/family education;Therapeutic exercises;Compression bandaging;Therapeutic activities;Neuromuscular education;Functional Mobility Training;Passive range of motion;Cognitive remediation/compensation;Electrical Stimulation;Manual Therapy;Dry needling;Visual/perceptual remediation/compensation   Plan further assess vision and VF's, pt also to bring in splint and Giv Mohr sling to assess if fitting appropriately, P/ROM supine and AA/ROM as tolerated LUE. (**Pt may get dizzy in supine, and possibly need emesis bag)   Consulted and Agree with Plan of Care Patient;Family member/caregiver   Family Member Consulted Husband      Patient will benefit from skilled therapeutic intervention in order to improve the following deficits and impairments:     Visit Diagnosis: Hemiplegia and hemiparesis following cerebral infarction affecting left non-dominant side (HCC) - Plan: Ot plan of care cert/re-cert  Pain in left shoulder - Plan: Ot plan of care cert/re-cert  Pain In Left Arm - Plan: Ot plan of care cert/re-cert  Other lack of coordination - Plan: Ot plan  of care cert/re-cert  Visuospatial deficit - Plan: Ot plan of care cert/re-cert  Other abnormalities of gait and mobility - Plan: Ot plan of care cert/re-cert    Problem List Patient Active Problem List   Diagnosis Date Noted  . 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 TIA (transient ischemic attack) 11/07/2014  . Hypertension 11/07/2014  . DM II (diabetes mellitus, type II), controlled (HCC) 11/07/2014    Kelli Churn, OTR/L 02/25/2016, 4:49 PM   [facility] [address] [place], [place],  [address] Phone: [phone]   Fax:  [phone]  Name: Shelly Mcdaniel MRN: [id number] Date of Birth: [birth year]

## 2016-02-26 ENCOUNTER — Ambulatory Visit: Payer: BC Managed Care – PPO | Admitting: Occupational Therapy

## 2016-02-26 ENCOUNTER — Encounter: Payer: Self-pay | Admitting: Occupational Therapy

## 2016-02-26 ENCOUNTER — Ambulatory Visit: Payer: BC Managed Care – PPO | Admitting: Rehabilitation

## 2016-02-26 ENCOUNTER — Encounter: Payer: Self-pay | Admitting: Rehabilitation

## 2016-02-26 DIAGNOSIS — M79602 Pain in left arm: Secondary | ICD-10-CM

## 2016-02-26 DIAGNOSIS — R41842 Visuospatial deficit: Secondary | ICD-10-CM

## 2016-02-26 DIAGNOSIS — I69354 Hemiplegia and hemiparesis following cerebral infarction affecting left non-dominant side: Secondary | ICD-10-CM

## 2016-02-26 DIAGNOSIS — R42 Dizziness and giddiness: Secondary | ICD-10-CM

## 2016-02-26 DIAGNOSIS — M25512 Pain in left shoulder: Secondary | ICD-10-CM

## 2016-02-26 DIAGNOSIS — R278 Other lack of coordination: Secondary | ICD-10-CM

## 2016-02-26 DIAGNOSIS — R2689 Other abnormalities of gait and mobility: Secondary | ICD-10-CM

## 2016-02-26 NOTE — Therapy (Signed)
[facility] [address] [place], [place], [address] Phone: [phone]   Fax:  [phone]  Physical Therapy Treatment  Patient Details  Name: Shelly Mcdaniel MRN: [id number] Date of Birth: Jan 31, 1956 Referring Provider: Lubertha South, MD  Encounter Date: 02/26/2016      PT End of Session - 02/26/16 0851    Visit Number 4   Number of Visits 21  including eval   Date for PT Re-Evaluation 04/18/16   Authorization Type BCBS 0 visit limit, 0 auth   PT Start Time [phone]   PT Stop Time 0930   PT Time Calculation (min) 44 min   Activity Tolerance Other (comment)  Limited by nausea   Behavior During Therapy [facility] for tasks assessed/performed      Past Medical History:  Diagnosis Date  . Diabetes mellitus without complication (HCC)   . Hyperlipidemia   . Hypertension     Past Surgical History:  Procedure Laterality Date  . BACK SURGERY    . BREAST SURGERY    . COLONOSCOPY WITH PROPOFOL N/A 12/24/2013   Procedure: COLONOSCOPY WITH PROPOFOL;  Surgeon: Charolett Bumpers, MD;  Location: [facility];  Service: Endoscopy;  Laterality: N/A;    There were no vitals filed for this visit.      Subjective Assessment - 02/26/16 0849    Subjective Pt continues to state increased dizziness with rolling, but is doing habituation exercises at home.     Patient is accompained by: Family member   Limitations House hold activities;Walking   Currently in Pain? No/denies   Pain Score 0-No pain               Postural Assessment Scale for Stroke Patients (PASS)  Give the subject instructions for each item as written below. When scoring the item, record the lowest response category that applies for each item.  Maintaining a Posture  _3_ 1. Sitting Without Support Instructions: Have the subject sit on a bench/mat without back support and with feet flat on the floor. (3) Can sit for 5 minutes without support (2) Can  sit for more than 10 seconds without support (1) Can sit with slight support (for example, by 1 hand) (0) Cannot sit  _3_ 2. Standing With Support Instructions: Have the subject stand, providing support as needed. Evaluate only the ability to stand with or without support. Do not consider the quality of the stance. (3) Can stand with support of only 1 hand (2) Can stand with moderate support of 1 person (1) Can stand with strong support of 2 people (0) Cannot stand, even with support  _2_ 3. Standing Without Support Instructions: Have the subject stand without support. Evaluate only the ability to stand with or without support. Do not consider the quality of the stance. (3) Can stand without support for more than 1 minute and simultaneously perform arm movements at about shoulder level (2) Can stand without support for 1 minute or stands slightly asymmetrically  (1) Can stand without support for 10 seconds or leans heavily on 1 leg (0) Cannot stand without support  _1_ 4. Standing on Nonparetic Leg Instructions: Have the subject stand on the nonparetic leg. Evaluate only the ability to bear weight entirely on the nonparetic leg. Do not consider how the subject accomplishes the task. (3) Can stand on nonparetic leg for more than 10 seconds (2) Can stand on nonparetic leg for more than 5 seconds (1) Can stand on nonparetic leg for a  few seconds (0) Cannot stand on nonparetic leg  _0_ 5. Standing on Paretic Leg Instructions: Have the subject stand on the paretic leg. Evaluate only the ability to bear weight entirely on the paretic leg. Do not consider how the subject accomplishes the task. (3) Can stand on paretic leg for more than 10 seconds (2) Can stand on paretic leg for more than 5 seconds (1) Can stand on paretic leg for a few seconds (0) Cannot stand on paretic leg  Maintaining Posture SUBTOTAL __9______  Changing a Posture  _3_ 6. Supine to Paretic Side Lateral Instructions:  Begin with the subject in supine on a treatment mat. Instruct the subject to roll to the paretic side (lateral movement). Assist as necessary. Evaluate the subject's performance on the amount of help required. Do not consider the quality of performance. (3) Can perform without help (2) Can perform with little help (1) Can perform with much help (0) Cannot perform  _3_ 7. Supine to Nonparetic Side Lateral Instructions: Begin with the subject in supine on a treatment mat. Instruct the subject to roll to the nonparetic side (lateral movement). Assist as necessary. Evaluate the subject's performance on the amount of help required. Do not consider the quality of performance. (3) Can perform without help (2) Can perform with little help (1) Can perform with much help (0) Cannot perform  _3_ 8. Supine to Sitting Up on the Edge of the Mat Instructions: Begin with the subject in supine on a treatment mat. Instruct the subject to come to sitting on the edge of the mat. Assist as necessary. Evaluate the subject's performance on the amount of help required. Do not consider the quality of performance. (3) Can perform without help (2) Can perform with little help (1) Can perform with much help (0) Cannot perform  _3_ 9. Sitting on the Edge of the Mat to Supine Instructions: Begin with the on the edge of a treatment mat. Instruct the subject to return to supine. Assist as necessary. Evaluate the subject's performance on the amount of help required. Do not consider the quality of performance. (3) Can perform without help (2) Can perform with little help (1) Can perform with much help (0) Cannot perform  _2_ 10. Sitting to Standing Up Instructions: Begin with the subject sitting on the edge of a treatment mat. Instruct the subject to stand up without support. Assist if necessary. Evaluate the subject's performance on the amount of help required. Do not consider the quality of performance. (3) Can perform  without help (2) Can perform with little help (1) Can perform with much help (0) Cannot perform  _2_ 11. Standing Up to Sitting Down Instructions: Begin with the subject standing by edge of a treatment mat. Instruct the subject to sit on edge of mat without support. Assist if necessary. Evaluate the subject's performance on the amount of help required. Do not consider the quality of performance. (3) Can perform without help (2) Can perform with little help (1) Can perform with much help (0) Cannot perform  _0_ 12. Standing, Picking Up a Pencil from the Floor Instructions: Begin with the subject standing. Instruct the subject to pick up a pencil fro the floor without support. Assist if necessary. Evaluate the subject's performance on the amount of help required. Do not consider the quality of performance. (3) Can perform without help (2) Can perform with little help (1) Can perform with much help (0) Cannot perform  Changing Posture SUBTOTAL __16___   TOTAL __25___  TA:  Performed PASS, see full details above.  Note pt is of increased fall risk.  Gait:  Initiated gait with use of LBQC (note she also used her L AFO and GiveMohr sling for LUE support).  Performed 80' x 1 with PT assisting providing cues for posture, slower R step, increased L weight shift, improved L hip protraction, and decreased L step length.  Continue to note decreased ability to clear L foot, therefore PT to contact orthotist to see if can add toe cap to shoe.  Pt and husband verbalized understanding.    Self Care: Then had husband role play pt so that PT could demonstrate how to assist properly.  Pt and husband then assisted x 2 reps (10' x 1 and another 25' x 1).  During first rep, pt tends to be heavy handed with cues to allow pt to move more freely.  Note marked improvement during second rep but would recommend that they practice more in therapy before continuing at home.  Husband to purchase [facility].                     PT Education - 02/26/16 0851    Education provided Yes   Education Details education on getting [facility] for home ambulation.   Person(s) Educated Patient;Spouse   Methods Explanation;Demonstration   Comprehension Verbalized understanding;Returned demonstration;Need further instruction          PT Short Term Goals - 02/18/16 1140      PT SHORT TERM GOAL #1   Title Pt will initiate HEP in order to indicate improved functional mobility and decreased fall risk.  (Target Date: 03/17/16)   Time 4   Period Weeks   Status New     PT SHORT TERM GOAL #2   Title Pt will perform stand pivot transfer w/ LRAD at S level in order to increase independence at home.     Time 4   Period Weeks   Status New     PT SHORT TERM GOAL #3   Title Pt will perform bed mobility at S level in order to indicate improved functional independence.     Time 4   Period Weeks   Status New     PT SHORT TERM GOAL #4   Title Pt will ambulate w/ LRAD x 150' at min A level in order to indicate improved household ambulation.     Time 4   Period Weeks   Status New     PT SHORT TERM GOAL #5   Title Pt will perform PASS and improve score 3 points in order to indicate improved functional mobility.     Time 4   Period Weeks   Status New           PT Long Term Goals - 02/18/16 1149      PT LONG TERM GOAL #1   Title Pt/spouse will be independent with HEP in order to indicate improved functional mobility and decreased fall risk.  (Target Date: 04/14/16)   Time 8   Period Weeks   Status New     PT LONG TERM GOAL #2   Title Pt will perform stand pivot transfers w/ LRAD at mod I level in order to indicate improved functional independence.    Time 8   Period Weeks   Status New     PT LONG TERM GOAL #3   Title Pt will perform bed mobility at mod I level in order to indicate improved  functional mobility.     Time 8   Period Weeks   Status New     PT LONG TERM GOAL #4   Title  Pt will ambulate x 150' w/ LRAD at mod I level over indoor surfaces to indicate increased independence with household ambulation.     Time 8   Period Weeks   Status New     PT LONG TERM GOAL #5   Title Pt will ambulate over paved outdoor surfaces w/ LRAD x 200' at S level in order to indicate initiation of community ambulation.    Time 8   Period Weeks   Status New               Plan - 02/26/16 [id number]    Clinical Impression Statement Skilled session focused on assessment of mobility with PASS.  Note total score of 25/36, indicative of increased fall risk.  Also focused on gait with use of LBQC for improved postural control and midline orientation.  She did very well, however does still requires min to intermittent mod A for stability.  Had husband Jesusita OkaDan return demonstration x 2 reps during session.  Would recommend he do again in therapy before beginning gait at home.    Rehab Potential Excellent   Clinical Impairments Affecting Rehab Potential Pt very motivated and able to attend therapy 3x/wk   PT Frequency 3x / week  then 2x/week for 4 weeks   PT Duration 4 weeks  then 2x/week for 4 weeks   PT Treatment/Interventions ADLs/Self Care Home Management;Canalith Repostioning;Electrical Stimulation;DME Instruction;Stair training;Gait training;Functional mobility training;Therapeutic activities;Therapeutic exercise;Balance training;Neuromuscular re-education;Patient/family education;Orthotic Fit/Training;Passive range of motion;Energy conservation;Taping;Vestibular;Visual/perceptual remediation/compensation   PT Next Visit Plan Reassess for BPPV and treat prn.  Address gait with husband-can they do [facility], HEP for LLE strengthening, sit<>stand, L LE NMR   Consulted and Agree with Plan of Care Patient;Family member/caregiver   Family Member Consulted Husband [name]      Patient will benefit from skilled therapeutic intervention in order to improve the following deficits and  impairments:  Abnormal gait, Decreased activity tolerance, Decreased balance, Decreased coordination, Decreased endurance, Decreased knowledge of use of DME, Decreased mobility, Decreased strength, Dizziness, Impaired perceived functional ability, Impaired flexibility, Impaired tone, Impaired UE functional use, Impaired vision/preception, Improper body mechanics, Postural dysfunction  Visit Diagnosis: Hemiplegia and hemiparesis following cerebral infarction affecting left non-dominant side (HCC)  Other lack of coordination  Other abnormalities of gait and mobility  Dizziness and giddiness     Problem List Patient Active Problem List   Diagnosis Date Noted  . 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 TIA (transient ischemic attack) 11/07/2014  . Hypertension 11/07/2014  . DM II (diabetes mellitus, type II), controlled (HCC) 11/07/2014    Harriet ButteEmily Taiz Bickle, PT, MPT [facility] [address] [place], [place], [address] Phone: [phone]   Fax:  [phone] 02/26/16, 12:11 PM  Name: Shelly Mcdaniel MRN: [id number] Date of Birth: Aug 20, 1955

## 2016-02-26 NOTE — Therapy (Signed)
[facility] [address] [place], [place], [address] Phone: [phone]   Fax:  [phone]  Occupational Therapy Treatment  Patient Details  Name: DENIS KOPPEL MRN: [id number] Date of Birth: 28-Sep-1955 Referring Provider: Dr. Tobie Lords  Encounter Date: 02/26/2016      OT End of Session - 02/26/16 1123    Visit Number 2   Number of Visits 24   Date for OT Re-Evaluation 04/25/16   Authorization Type BC/BS   OT Start Time 1017   OT Stop Time 1102   OT Time Calculation (min) 45 min   Activity Tolerance Patient tolerated treatment well   Behavior During Therapy [facility] for tasks assessed/performed      Past Medical History:  Diagnosis Date  . Diabetes mellitus without complication (HCC)   . Hyperlipidemia   . Hypertension     Past Surgical History:  Procedure Laterality Date  . BACK SURGERY    . BREAST SURGERY    . COLONOSCOPY WITH PROPOFOL N/A 12/24/2013   Procedure: COLONOSCOPY WITH PROPOFOL;  Surgeon: Charolett Bumpers, MD;  Location: [facility];  Service: Endoscopy;  Laterality: N/A;    There were no vitals filed for this visit.      Subjective Assessment - 02/26/16 1113    Currently in Pain? Yes   Pain Score 6    Pain Location Shoulder   Pain Orientation Left   Pain Descriptors / Indicators Sharp   Pain Type Acute pain   Pain Onset More than a month ago   Pain Frequency Intermittent   Aggravating Factors  malalignment   Pain Relieving Factors lidoderm patch   Multiple Pain Sites No                      OT Treatments/Exercises (OP) - 02/26/16 0001      ADLs   ADL Comments Reviewed short and long term goals with patient and husband.       Neurological Re-education Exercises   Other Exercises 1 Neuromuscular reeducation to address shoulder pain, malaignment and activation.  Patient developing increased muscle tone in flexor synergy pattern - shoulder internal rotation,  adduction, elbow, finger flexion.  Patient with report of pain 6/10 at start of session - left shoulder.  Worked on postural control to reduce pain in shoulder - shift weight toward left hip, while upright, tip ear toward left shoulder. This was helpful in reducing shoulder pain.  Supine worked on isolated control of mid range shoulder flex/ext guided movement.  Patient holds breath and uses a good deal of substitution with trunk muscles in attempt to move left arm.  With cueing and min facilitation able to isolate shoulder extension.     Other Exercises 2 Followed with sitting, with emphasis on postural alignment and control, as well as passive support to left GH joint.  Worked to facilitate flexion and extension in low reach pattern on moveable surface.                  OT Education - 02/26/16 1123    Education provided Yes   Education Details shoulder subluxation support, importance of gentle motion and alignment   Person(s) Educated Patient;Spouse   Methods Explanation   Comprehension Verbalized understanding;Need further instruction          OT Short Term Goals - 02/26/16 1126      OT SHORT TERM GOAL #1   Title Pt/family independent with initial HEP for LUE (All  STG's due 03/26/16)    Status On-going     OT SHORT TERM GOAL #2   Title Pt/family to verbalize understanding with proper positioning of LUE during activity and in bed to prevent pain including use of sling prn   Status On-going     OT SHORT TERM GOAL #3   Title Pt to perform UE dressing with min assist using hemi techniques   Status On-going     OT SHORT TERM GOAL #4   Title Pt to perform functional stand pivot transfer for toileting with no more than mod assist    Status On-going     OT SHORT TERM GOAL #5   Title Pt to be mod assist with LE dressing using hemi techniques and A/E PRN   Status On-going           OT Long Term Goals - 02/26/16 1126      OT LONG TERM GOAL #1   Title Independent with  updated HEP prn (all LTG's due 04/25/16)    Status On-going     OT LONG TERM GOAL #2   Title Pt to demo active low range sh. flexion to 25 degrees in prep for reaching and use of arm as stabilizer   Status On-going     OT LONG TERM GOAL #3   Title Pt to demo 50% gross finger flexion/extension to grasp/release cylindrical objects   Status On-going     OT LONG TERM GOAL #4   Title Pt to be supervision level with all BADLS   Status On-going     OT LONG TERM GOAL #5   Title Vision goal TBD    Status New               Plan - 02/26/16 1124    Clinical Impression Statement Patient and husband agree with OT goals, and are eager for functional improvement.  Patient with excellent family support.    Rehab Potential Good   OT Frequency 3x / week   OT Duration 8 weeks   OT Treatment/Interventions Self-care/ADL training;Moist Heat;DME and/or AE instruction;Splinting;Patient/family education;Therapeutic exercises;Compression bandaging;Therapeutic activities;Neuromuscular education;Functional Mobility Training;Passive range of motion;Cognitive remediation/compensation;Electrical Stimulation;Manual Therapy;Dry needling;Visual/perceptual remediation/compensation   Plan positioning for bed - review. Teach patient / husband range of motion   Consulted and Agree with Plan of Care Patient;Family member/caregiver   Family Member Consulted Husband - [name]      Patient will benefit from skilled therapeutic intervention in order to improve the following deficits and impairments:  Decreased activity tolerance, Decreased balance, Decreased cognition, Decreased knowledge of use of DME, Decreased coordination, Decreased endurance, Decreased range of motion, Decreased safety awareness, Decreased skin integrity, Decreased strength, Impaired perceived functional ability, Increased edema, Impaired sensation, Impaired tone, Impaired UE functional use, Impaired vision/preception, Pain, Improper body  mechanics  Visit Diagnosis: Hemiplegia and 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 TIA (transient ischemic attack) 11/07/2014  . Hypertension 11/07/2014  . DM II (diabetes mellitus, type II), controlled (HCC) 11/07/2014    Collier SalinaGellert, Elishah Ashmore M 02/26/2016, 11:28 AM   [facility] [address] [place], [place], [address] Phone: [phone]   Fax:  [phone]  Name: Wendie SimmerJody D Pulley MRN: [id number] Date of Birth: 1955-10-27

## 2016-02-29 ENCOUNTER — Ambulatory Visit: Payer: BC Managed Care – PPO | Admitting: Physical Therapy

## 2016-02-29 DIAGNOSIS — R2689 Other abnormalities of gait and mobility: Secondary | ICD-10-CM

## 2016-02-29 DIAGNOSIS — R2681 Unsteadiness on feet: Secondary | ICD-10-CM

## 2016-02-29 DIAGNOSIS — R42 Dizziness and giddiness: Secondary | ICD-10-CM

## 2016-02-29 DIAGNOSIS — I69354 Hemiplegia and hemiparesis following cerebral infarction affecting left non-dominant side: Secondary | ICD-10-CM | POA: Diagnosis not present

## 2016-02-29 NOTE — Therapy (Signed)
[facility] [address] [place], [place], [address] Phone: [phone]   Fax:  [phone]  Physical Therapy Treatment  Patient Details  Name: Shelly Mcdaniel MRN: [id number] Date of Birth: 10/28/1955 Referring Provider: Lubertha South, MD  Encounter Date: 02/29/2016      PT End of Session - 02/29/16 1300    Visit Number 5   Number of Visits 21   Date for PT Re-Evaluation 04/18/16   Authorization Type BCBS 0 visit limit, 0 auth   PT Start Time 1102   PT Stop Time 1142   PT Time Calculation (min) 40 min   Activity Tolerance Other (comment)  frequent rest breaks due to significant dizziness, nausea   Behavior During Therapy [facility] for tasks assessed/performed      Past Medical History:  Diagnosis Date  . Diabetes mellitus without complication (HCC)   . Hyperlipidemia   . Hypertension     Past Surgical History:  Procedure Laterality Date  . BACK SURGERY    . BREAST SURGERY    . COLONOSCOPY WITH PROPOFOL N/A 12/24/2013   Procedure: COLONOSCOPY WITH PROPOFOL;  Surgeon: Charolett Bumpers, MD;  Location: [facility];  Service: Endoscopy;  Laterality: N/A;    There were no vitals filed for this visit.      Subjective Assessment - 02/29/16 1107    Subjective Pt vomited today in the waiting room. Husband believes this was due to pt having had to take a shower this morning. Pt states, "I told my husband to take those curves more slowly on the way home." Baseline dizziness: 3/10.   Patient is accompained by: Family member   Limitations House hold activities;Walking   Currently in Pain? No/denies   Pain Onset --                         [facility] Adult PT Treatment/Exercise - 02/29/16 0001      Ambulation/Gait   Ambulation/Gait Yes   Ambulation/Gait Assistance 3: Mod assist;4: Min assist   Ambulation/Gait Assistance Details Gait using LBQC requiring min-mod A x30' with PT providing assist/cueing,  x50' with husband providing assist/cueing. Cueing emphasized placement of LBQC; tactile cueing for postural control, to control lateral/anterior weight shift. Husband provided safe assist, appropriate cueing during ambulation trial.  PT also provided verbal reminders for pt to stop and check symptoms of dizziness/nausea every 10-12 steps to prevent another episodeof emesis.   Ambulation Distance (Feet) 80 Feet  50' + 30'   Assistive device Large base quad cane;Other (Comment)  L AFO, L GivMohr sling   Gait Pattern Step-through pattern;Decreased stride length;Decreased stance time - left;Decreased hip/knee flexion - left;Decreased dorsiflexion - left;Lateral trunk lean to right;Trunk flexed;Decreased arm swing - left;Decreased step length - right;Decreased weight shift to left   Ambulation Surface Level;Indoor     Self-Care   Self-Care Other Self-Care Comments   Other Self-Care Comments  PT provided extensive education on cumulative nature of central dizziness, importance of stopping periodically (every 10 steps during ambulation) to assess dizziness and stop if dizziness exceeds 2 points above baseline. Explained and demonstrated sensory reorganization technique with effective return demo of technique. Also reiterated use of cold pack to mitigate nausea, and gaze fixation (emphasis on use during car ride) to decrease dizziness.                 PT Education - 02/29/16 1246    Education provided Yes   Education  Details Provided extensive education on management of central dizziness. See Self Care tab for details.    Person(s) Educated Patient;Spouse   Methods Explanation;Demonstration;Verbal cues   Comprehension Returned demonstration;Verbalized understanding;Need further instruction  May need to review in future session.          PT Short Term Goals - 02/18/16 1140      PT SHORT TERM GOAL #1   Title Pt will initiate HEP in order to indicate improved functional mobility and  decreased fall risk.  (Target Date: 03/17/16)   Time 4   Period Weeks   Status New     PT SHORT TERM GOAL #2   Title Pt will perform stand pivot transfer w/ LRAD at S level in order to increase independence at home.     Time 4   Period Weeks   Status New     PT SHORT TERM GOAL #3   Title Pt will perform bed mobility at S level in order to indicate improved functional independence.     Time 4   Period Weeks   Status New     PT SHORT TERM GOAL #4   Title Pt will ambulate w/ LRAD x 150' at min A level in order to indicate improved household ambulation.     Time 4   Period Weeks   Status New     PT SHORT TERM GOAL #5   Title Pt will perform PASS and improve score 3 points in order to indicate improved functional mobility.     Time 4   Period Weeks   Status New           PT Long Term Goals - 02/18/16 1149      PT LONG TERM GOAL #1   Title Pt/spouse will be independent with HEP in order to indicate improved functional mobility and decreased fall risk.  (Target Date: 04/14/16)   Time 8   Period Weeks   Status New     PT LONG TERM GOAL #2   Title Pt will perform stand pivot transfers w/ LRAD at mod I level in order to indicate improved functional independence.    Time 8   Period Weeks   Status New     PT LONG TERM GOAL #3   Title Pt will perform bed mobility at mod I level in order to indicate improved functional mobility.     Time 8   Period Weeks   Status New     PT LONG TERM GOAL #4   Title Pt will ambulate x 150' w/ LRAD at mod I level over indoor surfaces to indicate increased independence with household ambulation.     Time 8   Period Weeks   Status New     PT LONG TERM GOAL #5   Title Pt will ambulate over paved outdoor surfaces w/ LRAD x 200' at S level in order to indicate initiation of community ambulation.    Time 8   Period Weeks   Status New               Plan - 02/29/16 1300    Clinical Impression Statement Session focused on continued  hands-on training with husband providing assist during ambulation of level, indoor surfaces with LBQC. Pt[facility]. Husband provided safe assistance, appropriate cueing and therefore is now safe to assist pt during walking short distances at home with [facility]. Pt with episode of emesis while in waiting room today.  Therefore, much of this session also focused on reiterating education on cumulative nature of central dizziness/nausea, management of symptoms, and importance of rest breaks to avoid emesis.    Rehab Potential Excellent   Clinical Impairments Affecting Rehab Potential Pt very motivated and able to attend therapy 3x/wk   PT Frequency 3x / week  then 2x/week for 4 weeks   PT Duration 4 weeks  then 2x/week for 4 weeks   PT Treatment/Interventions ADLs/Self Care Home Management;Canalith Repostioning;Electrical Stimulation;DME Instruction;Stair training;Gait training;Functional mobility training;Therapeutic activities;Therapeutic exercise;Balance training;Neuromuscular re-education;Patient/family education;Orthotic Fit/Training;Passive range of motion;Energy conservation;Taping;Vestibular;Visual/perceptual remediation/compensation   PT Next Visit Plan Reassess for BPPV and treat prn, HEP for LLE strengthening, sit<>stand, L LE NMR. Standing balance, midline orienation, postural control.   Consulted and Agree with Plan of Care Patient;Family member/caregiver   Family Member Consulted Husband [name]      Patient will benefit from skilled therapeutic intervention in order to improve the following deficits and impairments:  Abnormal gait, Decreased activity tolerance, Decreased balance, Decreased coordination, Decreased endurance, Decreased knowledge of use of DME, Decreased mobility, Decreased strength, Dizziness, Impaired perceived functional ability, Impaired flexibility, Impaired tone, Impaired UE functional use, Impaired vision/preception, Improper body  mechanics, Postural dysfunction  Visit Diagnosis: Hemiplegia and hemiparesis following cerebral infarction affecting left non-dominant side (HCC)  Other abnormalities of gait and mobility  Dizziness and giddiness  Unsteadiness on feet     Problem List Patient Active Problem List   Diagnosis Date Noted  . 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 TIA (transient ischemic attack) 11/07/2014  . Hypertension 11/07/2014  . DM II (diabetes mellitus, type II), controlled (HCC) 11/07/2014    Jorje Guild, PT, DPT [facility] [address] [place], [place], [address] Phone: [phone]   Fax:  [phone] 02/29/16, 1:04 PM  Name: Shelly Mcdaniel MRN: [id number] Date of Birth: Apr 19, 1956

## 2016-03-01 ENCOUNTER — Encounter: Payer: Self-pay | Admitting: Occupational Therapy

## 2016-03-01 ENCOUNTER — Ambulatory Visit: Payer: BC Managed Care – PPO | Admitting: Occupational Therapy

## 2016-03-01 ENCOUNTER — Ambulatory Visit: Payer: BC Managed Care – PPO | Admitting: Physical Therapy

## 2016-03-01 DIAGNOSIS — R41842 Visuospatial deficit: Secondary | ICD-10-CM

## 2016-03-01 DIAGNOSIS — R2689 Other abnormalities of gait and mobility: Secondary | ICD-10-CM

## 2016-03-01 DIAGNOSIS — R278 Other lack of coordination: Secondary | ICD-10-CM

## 2016-03-01 DIAGNOSIS — R2681 Unsteadiness on feet: Secondary | ICD-10-CM

## 2016-03-01 DIAGNOSIS — R293 Abnormal posture: Secondary | ICD-10-CM

## 2016-03-01 DIAGNOSIS — M25512 Pain in left shoulder: Secondary | ICD-10-CM

## 2016-03-01 DIAGNOSIS — I69354 Hemiplegia and hemiparesis following cerebral infarction affecting left non-dominant side: Secondary | ICD-10-CM | POA: Diagnosis not present

## 2016-03-01 DIAGNOSIS — M79602 Pain in left arm: Secondary | ICD-10-CM

## 2016-03-01 DIAGNOSIS — R42 Dizziness and giddiness: Secondary | ICD-10-CM

## 2016-03-01 NOTE — Therapy (Signed)
[facility] [address] [place], [place], [address] Phone: [phone]   Fax:  [phone]  Occupational Therapy Treatment  Patient Details  Name: Shelly SimmerJody D Mcdaniel MRN: [id number] Date of Birth: [birth year] Referring Provider: Dr. Tobie LordsLee Shuping  Encounter Date: 03/01/2016      OT End of Session - 03/01/16 1111    Visit Number 3   Number of Visits 24   Date for OT Re-Evaluation 04/25/16   Authorization Type BC/BS   OT Start Time 1015   OT Stop Time 1100   OT Time Calculation (min) 45 min   Activity Tolerance Patient tolerated treatment well   Behavior During Therapy [facility] for tasks assessed/performed      Past Medical History:  Diagnosis Date  . Diabetes mellitus without complication (HCC)   . Hyperlipidemia   . Hypertension     Past Surgical History:  Procedure Laterality Date  . BACK SURGERY    . BREAST SURGERY    . COLONOSCOPY WITH PROPOFOL N/A 12/24/2013   Procedure: COLONOSCOPY WITH PROPOFOL;  Surgeon: Charolett BumpersMartin K Johnson, MD;  Location: [facility];  Service: Endoscopy;  Laterality: N/A;    There were no vitals filed for this visit.      Subjective Assessment - 03/01/16 1104    Subjective  No pain in left arm   Patient is accompained by: Family member   Limitations vestibular deficits (central and peripheral deficits), fall risk, Lt AFO, sh. subluxation   Patient Stated Goals I want my hand to come back   Currently in Pain? No/denies   Pain Score 0-No pain                      OT Treatments/Exercises (OP) - 03/01/16 0001      ADLs   Functional Mobility Worked on squat versus stand pivot transfers - level surface.  Patient has better control in lower position for more independent transfers.       Neurological Re-education Exercises   Other Exercises 1 Neuromuscular reeducation to address active relaxation in left UE, and improved postural control .  Patient's husband present for  therapy session, and reveiwed passive range of motion for left shoulder and left elbow in supine.  Reviewed positioning in bed lying in sidelying on left side with left arm at approximately 45* angle to body.  Patient able to tolerate long arm weight bearing in sitting, and began to address mid joint control for flexion and extension.  Patient showing beginnign shoulder flexion, extension following weight bearing through left arm, body on arm motions.                   OT Education - 03/01/16 1111    Education provided Yes   Education Details passive range of motion to left arm   Person(s) Educated Patient;Spouse   Methods Explanation;Demonstration   Comprehension Verbalized understanding;Returned demonstration          OT Short Term Goals - 02/26/16 1126      OT SHORT TERM GOAL #1   Title Pt/family independent with initial HEP for LUE (All STG's due 03/26/16)    Status On-going     OT SHORT TERM GOAL #2   Title Pt/family to verbalize understanding with proper positioning of LUE during activity and in bed to prevent pain including use of sling prn   Status On-going     OT SHORT TERM GOAL #3   Title Pt to perform UE dressing  with min assist using hemi techniques   Status On-going     OT SHORT TERM GOAL #4   Title Pt to perform functional stand pivot transfer for toileting with no more than mod assist    Status On-going     OT SHORT TERM GOAL #5   Title Pt to be mod assist with LE dressing using hemi techniques and A/E PRN   Status On-going           OT Long Term Goals - 02/26/16 1126      OT LONG TERM GOAL #1   Title Independent with updated HEP prn (all LTG's due 04/25/16)    Status On-going     OT LONG TERM GOAL #2   Title Pt to demo active low range sh. flexion to 25 degrees in prep for reaching and use of arm as stabilizer   Status On-going     OT LONG TERM GOAL #3   Title Pt to demo 50% gross finger flexion/extension to grasp/release cylindrical  objects   Status On-going     OT LONG TERM GOAL #4   Title Pt to be supervision level with all BADLS   Status On-going     OT LONG TERM GOAL #5   Title Vision goal TBD    Status New               Plan - 03/01/16 1112    Clinical Impression Statement Patient's husband able to perform simple range of motion exercise to help maintain current joint mobility, as well as prevent furhter tightness.     Rehab Potential Good   OT Frequency 3x / week   OT Duration 8 weeks   OT Treatment/Interventions Self-care/ADL training;Moist Heat;DME and/or AE instruction;Splinting;Patient/family education;Therapeutic exercises;Compression bandaging;Therapeutic activities;Neuromuscular education;Functional Mobility Training;Passive range of motion;Cognitive remediation/compensation;Electrical Stimulation;Manual Therapy;Dry needling;Visual/perceptual remediation/compensation   Plan NMR LUE, postural control   Consulted and Agree with Plan of Care Patient;Family member/caregiver   Family Member Consulted Husband - Shelly Mcdaniel      Patient will benefit from skilled therapeutic intervention in order to improve the following deficits and impairments:  Decreased activity tolerance, Decreased balance, Decreased cognition, Decreased knowledge of use of DME, Decreased coordination, Decreased endurance, Decreased range of motion, Decreased safety awareness, Decreased skin integrity, Decreased strength, Impaired perceived functional ability, Increased edema, Impaired sensation, Impaired tone, Impaired UE functional use, Impaired vision/preception, Pain, Improper body mechanics  Visit Diagnosis: Hemiplegia and hemiparesis 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 TIA (transient ischemic attack) 11/07/2014  . Hypertension 11/07/2014  . DM II (diabetes mellitus, type II), controlled (HCC) 11/07/2014     Collier Salina, OTR/L 03/01/2016, 11:14 AM  [facility] [address] [place], [place], [address] Phone: [phone]   Fax:  [phone]  Name: Shelly Mcdaniel MRN: [id number] Date of Birth: February 16, 1956

## 2016-03-01 NOTE — Therapy (Signed)
[facility] [address] [place], [place], [address] Phone: [phone]   Fax:  [phone]  Physical Therapy Treatment  Patient Details  Name: Shelly Mcdaniel MRN: [id number] Date of Birth: 1955/07/31 Referring Provider: Lubertha South, MD  Encounter Date: 03/01/2016      PT End of Session - 03/01/16 1255    Visit Number 6   Number of Visits 21   Date for PT Re-Evaluation 04/18/16   Authorization Type BCBS 0 visit limit, 0 auth   PT Start Time 1151   PT Stop Time 1231   PT Time Calculation (min) 40 min   Activity Tolerance Patient limited by fatigue   Behavior During Therapy [facility] for tasks assessed/performed      Past Medical History:  Diagnosis Date  . Diabetes mellitus without complication (HCC)   . Hyperlipidemia   . Hypertension     Past Surgical History:  Procedure Laterality Date  . BACK SURGERY    . BREAST SURGERY    . COLONOSCOPY WITH PROPOFOL N/A 12/24/2013   Procedure: COLONOSCOPY WITH PROPOFOL;  Surgeon: Charolett Bumpers, MD;  Location: [facility];  Service: Endoscopy;  Laterality: N/A;    There were no vitals filed for this visit.      Subjective Assessment - 03/01/16 1155    Subjective When pt/husband asked if they've been performing HEP provided by primary PT, husband replies, "We don't have anything yet for physical therapy."Quad cane arrived yesterday. Baseline dizziness: 1/10; nausea: 0/10.   Patient is accompained by: Family member  husband, [name]   Limitations House hold activities;Walking   Currently in Pain? No/denies                         [facility] Adult PT Treatment/Exercise - 03/01/16 0001      Transfers   Transfers Stand to Sit;Sit to Stand   Sit to Stand 4: Min assist   Sit to Stand Details Manual facilitation for weight bearing;Manual facilitation for weight shifting;Verbal cues for sequencing;Verbal cues for technique;Verbal cues for precautions/safety    Stand to Sit 4: Min assist   Stand to Sit Details (indicate cue type and reason) Manual facilitation for weight bearing;Manual facilitation for weight shifting;Verbal cues for technique;Verbal cues for sequencing;Verbal cues for precautions/safety   Stand Pivot Transfers 3: Mod assist   Stand Pivot Transfer Details (indicate cue type and reason) manual facilitation of lateran weight shift; cueing for grading of LLE movement during pivoting     Posture/Postural Control   Posture/Postural Control Postural limitations   Postural Limitations Flexed trunk;Posterior pelvic tilt   Posture Comments Lateral trunk flexion to R side, passive elongation of trunk on L.  Cueing emphasized scooting to EOM/edge of seat to decrease posterior pelvic tilt, to promote more active postural alignment. Cueing also for symmetrical LE positioning to promote more normalized posture.     Neuro Re-ed    Neuro Re-ed Details  PT demonstrated assist/cueing for static standing with intermittent RUE support (cueing for pt to utilize gaze fixation on far target to mitigate central dizziness) x2 minutes consecutively. Husband then gave effective return demo of providing min A during static standing x1.5 minutes (to pt tolerance) after seated rest break. PT provided cueing for hand placement. After seated rest break, pt performed standing with concurrent lateral weight shiting x5 reps per side with PT providing min-mod A, cueing, then x10 reps per side with husband providing assist/cueing. Cueing focused on pt  shifting weight from RLE to LLE rather than moving trunk laterally from R to L to increase postural control.         Vestibular Treatment/Exercise - 03/01/16 0001      Vestibular Treatment/Exercise   Vestibular Treatment Provided Habituation   Habituation Exercises Seated Horizontal Head Turns;Seated Vertical Head Turns     Seated Horizontal Head Turns   Number of Reps  5   Symptom Description  4/10 dizziness after 5  reps. Returned to baseline after gaze fixation x1 minute; however, pt requesting to lie down ("not dizzy, just really tired," per pt) soon after this exercise.  baseline dizziness: 1/10     Seated Vertical Head Turns   Number of Reps  5   Symptom Description  3/10 dizziness and "feeling off balance" after 5 reps. Able to recover with gaze fixation (far) for approx. 1 minute.               PT Education - 03/01/16 1241    Education provided Yes   Education Details Reviewed HEP provided during previous session; see Pt Instructions for details.    Person(s) Educated Patient;Spouse   Methods Explanation;Demonstration;Handout;Verbal cues   Comprehension Verbalized understanding;Returned demonstration;Need further instruction  Will likely need to review during future session.          PT Short Term Goals - 02/18/16 1140      PT SHORT TERM GOAL #1   Title Pt will initiate HEP in order to indicate improved functional mobility and decreased fall risk.  (Target Date: 03/17/16)   Time 4   Period Weeks   Status New     PT SHORT TERM GOAL #2   Title Pt will perform stand pivot transfer w/ LRAD at S level in order to increase independence at home.     Time 4   Period Weeks   Status New     PT SHORT TERM GOAL #3   Title Pt will perform bed mobility at S level in order to indicate improved functional independence.     Time 4   Period Weeks   Status New     PT SHORT TERM GOAL #4   Title Pt will ambulate w/ LRAD x 150' at min A level in order to indicate improved household ambulation.     Time 4   Period Weeks   Status New     PT SHORT TERM GOAL #5   Title Pt will perform PASS and improve score 3 points in order to indicate improved functional mobility.     Time 4   Period Weeks   Status New           PT Long Term Goals - 02/18/16 1149      PT LONG TERM GOAL #1   Title Pt/spouse will be independent with HEP in order to indicate improved functional mobility and  decreased fall risk.  (Target Date: 04/14/16)   Time 8   Period Weeks   Status New     PT LONG TERM GOAL #2   Title Pt will perform stand pivot transfers w/ LRAD at mod I level in order to indicate improved functional independence.    Time 8   Period Weeks   Status New     PT LONG TERM GOAL #3   Title Pt will perform bed mobility at mod I level in order to indicate improved functional mobility.     Time 8   Period Weeks   Status New  PT LONG TERM GOAL #4   Title Pt will ambulate x 150' w/ LRAD at mod I level over indoor surfaces to indicate increased independence with household ambulation.     Time 8   Period Weeks   Status New     PT LONG TERM GOAL #5   Title Pt will ambulate over paved outdoor surfaces w/ LRAD x 200' at S level in order to indicate initiation of community ambulation.    Time 8   Period Weeks   Status New               Plan - 03/01/16 1256    Clinical Impression Statement Session focused on reviewing HEP with pt/husband, with emphasis on habituation to functional head movement, postural control/midline orientation in standing, and tolerance to standing. Pt/husband did not recall receiving brief HEP during past session. Husband did give effective return demo of safe assistance/appropriate cueing during this session.   Rehab Potential Excellent   Clinical Impairments Affecting Rehab Potential Pt very motivated and able to attend therapy 3x/wk   PT Frequency 3x / week  then 2x/week for 4 weeks   PT Duration 4 weeks  then 2x/week for 4 weeks   PT Treatment/Interventions ADLs/Self Care Home Management;Canalith Repostioning;Electrical Stimulation;DME Instruction;Stair training;Gait training;Functional mobility training;Therapeutic activities;Therapeutic exercise;Balance training;Neuromuscular re-education;Patient/family education;Orthotic Fit/Training;Passive range of motion;Energy conservation;Taping;Vestibular;Visual/perceptual remediation/compensation    PT Next Visit Plan If pt/husband have been compliant with current brief HEP, consider adding exercises for LLE strengthening, sit<>stand. Continue to address tolerance to functional head movement, standing balance, midline orienation, postural control, , L LE NMR.    Consulted and Agree with Plan of Care Patient;Family member/caregiver   Family Member Consulted Husband [name]      Patient will benefit from skilled therapeutic intervention in order to improve the following deficits and impairments:  Abnormal gait, Decreased activity tolerance, Decreased balance, Decreased coordination, Decreased endurance, Decreased knowledge of use of DME, Decreased mobility, Decreased strength, Dizziness, Impaired perceived functional ability, Impaired flexibility, Impaired tone, Impaired UE functional use, Impaired vision/preception, Improper body mechanics, Postural dysfunction  Visit Diagnosis: Hemiplegia and hemiparesis following cerebral infarction affecting left non-dominant side (HCC)  Other abnormalities of gait and mobility  Dizziness and giddiness  Unsteadiness on feet     Problem List Patient Active Problem List   Diagnosis Date Noted  . 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 TIA (transient ischemic attack) 11/07/2014  . Hypertension 11/07/2014  . DM II (diabetes mellitus, type II), controlled (HCC) 11/07/2014    Jorje Guild, PT, DPT [facility] [address] [place], [place], [address] Phone: [phone]   Fax:  [phone] 03/01/16, 1:06 PM  Name: Shelly Mcdaniel MRN: [id number] Date of Birth: 1956/02/07

## 2016-03-01 NOTE — Patient Instructions (Addendum)
1:  Work on sitting and focusing on improved posture.  You can use a towel roll at your low back or the kidney pillow that you talked about.  Make sure that your Left arm is supported in good resting position.  Don't add too many pillows as this will be uncomfortable for your shoulder and cause mal-alignment.  Also when you are in bed lying on your back, ensure that your left arm is supported beside you and you may need small towel under shoulder for support.    2:  Sitting in w/c or chair, work on moving your head and eyes up and down and side to side.  Gauge your dizziness.  If you start exercises at 4/10 dizziness, ensure that you do not get above 6 (no more than 2 points higher than you start) or else stop and take a break and focus on target in front of you that is not moving.   Progress from performing 5 consecutive head turns (per direction) by 1-2 head turns, until able to perform 10 consecutive head turns.  3:  Standing in front of sink or mirror in bathroom (kitchen) and work on the following: - Stand tall with equal weight on each leg. Legs close to surface behind you (but not actually touching this surface). Progress from holding onto countertop/stable chair of you to standing without support Jesusita Oka([name], provide hands-on assistance here). - Shift your weight from R and L to increase weight on left leg.  Repeat x 10 reps with Jesusita Okaan close to you. Remove right hand from countertop/stable chair, as able.

## 2016-03-02 ENCOUNTER — Ambulatory Visit: Payer: BC Managed Care – PPO | Admitting: Occupational Therapy

## 2016-03-02 ENCOUNTER — Encounter: Payer: Self-pay | Admitting: Occupational Therapy

## 2016-03-02 DIAGNOSIS — R41842 Visuospatial deficit: Secondary | ICD-10-CM

## 2016-03-02 DIAGNOSIS — R278 Other lack of coordination: Secondary | ICD-10-CM

## 2016-03-02 DIAGNOSIS — R2681 Unsteadiness on feet: Secondary | ICD-10-CM

## 2016-03-02 DIAGNOSIS — I69354 Hemiplegia and hemiparesis following cerebral infarction affecting left non-dominant side: Secondary | ICD-10-CM

## 2016-03-02 DIAGNOSIS — M79602 Pain in left arm: Secondary | ICD-10-CM

## 2016-03-02 DIAGNOSIS — R293 Abnormal posture: Secondary | ICD-10-CM

## 2016-03-02 DIAGNOSIS — M25512 Pain in left shoulder: Secondary | ICD-10-CM

## 2016-03-02 NOTE — Therapy (Signed)
[facility] [address] [place], [place], [address] Phone: [phone]   Fax:  [phone]  Occupational Therapy Treatment  Patient Details  Name: Shelly SimmerJody D Captain MRN: [id number] Date of Birth: 1956/04/28 Referring Provider: Dr. Tobie LordsLee Shuping  Encounter Date: 03/02/2016      OT End of Session - 03/02/16 1658    Visit Number 4   Number of Visits 24   Date for OT Re-Evaluation 04/25/16   Authorization Type BC/BS   OT Start Time 1445   OT Stop Time 1530   OT Time Calculation (min) 45 min   Activity Tolerance Patient tolerated treatment well   Behavior During Therapy [facility] for tasks assessed/performed      Past Medical History:  Diagnosis Date  . Diabetes mellitus without complication (HCC)   . Hyperlipidemia   . Hypertension     Past Surgical History:  Procedure Laterality Date  . BACK SURGERY    . BREAST SURGERY    . COLONOSCOPY WITH PROPOFOL N/A 12/24/2013   Procedure: COLONOSCOPY WITH PROPOFOL;  Surgeon: Charolett BumpersMartin K Johnson, MD;  Location: [facility];  Service: Endoscopy;  Laterality: N/A;    There were no vitals filed for this visit.      Subjective Assessment - 03/02/16 1651    Subjective  I moved my thumb and my arm today!   Limitations vestibular deficits (central and peripheral deficits), fall risk, Lt AFO, sh. subluxation   Patient Stated Goals I want my hand to come back   Currently in Pain? No/denies   Pain Score 0-No pain                      OT Treatments/Exercises (OP) - 03/02/16 0001      Neurological Re-education Exercises   Other Exercises 1 Neuromuscular reeducation to facilitate active movement in left arm.  In sidelying, patient able to actively attend to left arm, and isolate elbow flexion and extension.   Patient needs increased time and responds well to tactile stimulation for activation.  Followed in sitting with long arm weight bearing onto left hand.  Patient showing  beginning mid joint control to flex/extend elbow in this position.  Patient now with trace movement in digits and forearm, and today able to bring thumb and index finger to 1 inch wooden block and lmaintain enough pressure to lift from surface.  Patient very excited to see movement returning to left arm.. Husband very supportive and encouraging.                  OT Education - 03/02/16 1658    Education provided Yes   Education Details benefits of weight bearing for hemiplegia   Person(s) Educated Patient;Spouse   Methods Explanation;Demonstration;Tactile cues;Verbal cues   Comprehension Verbalized understanding;Verbal cues required;Tactile cues required;Need further instruction          OT Short Term Goals - 02/26/16 1126      OT SHORT TERM GOAL #1   Title Pt/family independent with initial HEP for LUE (All STG's due 03/26/16)    Status On-going     OT SHORT TERM GOAL #2   Title Pt/family to verbalize understanding with proper positioning of LUE during activity and in bed to prevent pain including use of sling prn   Status On-going     OT SHORT TERM GOAL #3   Title Pt to perform UE dressing with min assist using hemi techniques   Status On-going  OT SHORT TERM GOAL #4   Title Pt to perform functional stand pivot transfer for toileting with no more than mod assist    Status On-going     OT SHORT TERM GOAL #5   Title Pt to be mod assist with LE dressing using hemi techniques and A/E PRN   Status On-going           OT Long Term Goals - 02/26/16 1126      OT LONG TERM GOAL #1   Title Independent with updated HEP prn (all LTG's due 04/25/16)    Status On-going     OT LONG TERM GOAL #2   Title Pt to demo active low range sh. flexion to 25 degrees in prep for reaching and use of arm as stabilizer   Status On-going     OT LONG TERM GOAL #3   Title Pt to demo 50% gross finger flexion/extension to grasp/release cylindrical objects   Status On-going     OT  LONG TERM GOAL #4   Title Pt to be supervision level with all BADLS   Status On-going     OT LONG TERM GOAL #5   Title Vision goal TBD    Status New               Plan - 03/02/16 1658    Clinical Impression Statement Patient's shoulder pain is reducing overall, and she is beginning to move left arm slightly at each joint.     Rehab Potential Good   OT Frequency 3x / week   OT Duration 8 weeks   OT Treatment/Interventions Self-care/ADL training;Moist Heat;DME and/or AE instruction;Splinting;Patient/family education;Therapeutic exercises;Compression bandaging;Therapeutic activities;Neuromuscular education;Functional Mobility Training;Passive range of motion;Cognitive remediation/compensation;Electrical Stimulation;Manual Therapy;Dry needling;Visual/perceptual remediation/compensation   Plan NMR LUE, postural control   Consulted and Agree with Plan of Care Patient;Family member/caregiver   Family Member Consulted Husband - [name]      Patient will benefit from skilled therapeutic intervention in order to improve the following deficits and impairments:  Decreased activity tolerance, Decreased balance, Decreased cognition, Decreased knowledge of use of DME, Decreased coordination, Decreased endurance, Decreased range of motion, Decreased safety awareness, Decreased skin integrity, Decreased strength, Impaired perceived functional ability, Increased edema, Impaired sensation, Impaired tone, Impaired UE functional use, Impaired vision/preception, Pain, Improper body mechanics  Visit Diagnosis: Hemiplegia and hemiparesis 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 TIA (transient ischemic attack) 11/07/2014  . Hypertension 11/07/2014   . DM II (diabetes mellitus, type II), controlled (HCC) 11/07/2014    Collier Salina, OTR/L 03/02/2016, 5:00 PM  [facility] [address] [place], [place], [address] Phone: [phone]   Fax:  [phone]  Name: MARGIT BATTE MRN: [id number] Date of Birth: [birth year]

## 2016-03-04 ENCOUNTER — Encounter: Payer: Self-pay | Admitting: Occupational Therapy

## 2016-03-04 ENCOUNTER — Ambulatory Visit: Payer: BC Managed Care – PPO | Admitting: Physical Therapy

## 2016-03-04 ENCOUNTER — Ambulatory Visit: Payer: BC Managed Care – PPO | Admitting: Occupational Therapy

## 2016-03-04 DIAGNOSIS — R293 Abnormal posture: Secondary | ICD-10-CM

## 2016-03-04 DIAGNOSIS — I69354 Hemiplegia and hemiparesis following cerebral infarction affecting left non-dominant side: Secondary | ICD-10-CM

## 2016-03-04 DIAGNOSIS — R2689 Other abnormalities of gait and mobility: Secondary | ICD-10-CM

## 2016-03-04 DIAGNOSIS — M25512 Pain in left shoulder: Secondary | ICD-10-CM

## 2016-03-04 DIAGNOSIS — R41842 Visuospatial deficit: Secondary | ICD-10-CM

## 2016-03-04 DIAGNOSIS — R2681 Unsteadiness on feet: Secondary | ICD-10-CM

## 2016-03-04 DIAGNOSIS — M79602 Pain in left arm: Secondary | ICD-10-CM

## 2016-03-04 DIAGNOSIS — R278 Other lack of coordination: Secondary | ICD-10-CM

## 2016-03-04 DIAGNOSIS — R42 Dizziness and giddiness: Secondary | ICD-10-CM

## 2016-03-04 NOTE — Therapy (Signed)
[facility] [address] [place], [place], [address] Phone: [phone]   Fax:  [phone]  Physical Therapy Treatment  Patient Details  Name: Shelly SimmerJody D Pippen MRN: [id number] Date of Birth: 1956-02-29 Referring Provider: Lubertha SouthLee Thomas Shuping, MD  Encounter Date: 03/04/2016      PT End of Session - 03/04/16 1301    Visit Number 7   Number of Visits 21   Date for PT Re-Evaluation 04/18/16   Authorization Type BCBS 0 visit limit, 0 auth   PT Start Time 1105   PT Stop Time 1145   PT Time Calculation (min) 40 min   Equipment Utilized During Treatment Gait belt   Activity Tolerance Other (comment)  Pt limited by dizziness (but no nausea today)   Behavior During Therapy [facility] for tasks assessed/performed      Past Medical History:  Diagnosis Date  . Diabetes mellitus without complication (HCC)   . Hyperlipidemia   . Hypertension     Past Surgical History:  Procedure Laterality Date  . BACK SURGERY    . BREAST SURGERY    . COLONOSCOPY WITH PROPOFOL N/A 12/24/2013   Procedure: COLONOSCOPY WITH PROPOFOL;  Surgeon: Charolett BumpersMartin K Johnson, MD;  Location: [facility];  Service: Endoscopy;  Laterality: N/A;    There were no vitals filed for this visit.      Subjective Assessment - 03/04/16 1254    Subjective Per husband, "We've tried the exercises you gave us...honestly, we haven't had a chance to walk with the cane yet." Per patient, "I haven't been nearly as dizzy or nauseated."   Patient is accompained by: Family member   Limitations House hold activities;Walking   Currently in Pain? No/denies                Vestibular Assessment - 03/04/16 0001      Vestibular Assessment   General Observation --  increased time required for transitional movements     Positional Testing   Sidelying Test Sidelying Right;Sidelying Left   Horizontal Canal Testing Horizontal Canal Right;Horizontal Canal Left     Sidelying  Right   Sidelying Right Duration NA   Sidelying Right Symptoms No nystagmus     Sidelying Left   Sidelying Left Duration NA   Sidelying Left Symptoms No nystagmus     Horizontal Canal Right   Horizontal Canal Right Duration NA   Horizontal Canal Right Symptoms Normal     Horizontal Canal Left   Horizontal Canal Left Duration NA   Horizontal Canal Left Symptoms Normal                 [facility] Adult PT Treatment/Exercise - 03/04/16 0001      Transfers   Transfers Sit to Stand;Stand to Sit;Squat Pivot Transfers   Sit to Stand 4: Min assist;3: Mod assist   Sit to Stand Details (indicate cue type and reason) cueing for foot placement, anterior weight shift   Stand to Sit 4: Min assist   Squat Pivot Transfers 4: Min guard   Transfer Cueing cueing for setup     Ambulation/Gait   Ambulation/Gait Yes   Ambulation/Gait Assistance 3: Mod assist;4: Min assist;2: Max assist   Ambulation/Gait Assistance Details Gait x30' with LBQC, L AFO, and L GivMohr sling with husband providing mod-max A due to frequent posterior LOB. Trial ended due to safety concerns and pt report of feeling as though she was being pulled backwards by husband, husband reporting pt is pushing backwards. Gait  x100' with RW and L h/o with min-mod A, cueing for wider BOS, decreased step length, and for grading of lateral weight shift to R side (due to pt tendency to bring head/shoulders very far to R of midline, causing near-LOB to R side).   Ambulation Distance (Feet) 130 Feet  total   Assistive device Large base quad cane;Other (Comment);Rolling walker  L GivMohr sling w/ QC, L h/o with RW, L AFO w/ all trials   Gait Pattern Step-through pattern;Decreased stride length;Decreased stance time - left;Decreased hip/knee flexion - left;Decreased dorsiflexion - left;Lateral trunk lean to right;Trunk flexed;Decreased arm swing - left;Decreased step length - right;Decreased weight shift to left;Narrow base of  support;Scissoring;Ataxic  dec grading/control of wt shift to R   Ambulation Surface Level;Indoor                  PT Short Term Goals - 02/18/16 1140      PT SHORT TERM GOAL #1   Title Pt will initiate HEP in order to indicate improved functional mobility and decreased fall risk.  (Target Date: 03/17/16)   Time 4   Period Weeks   Status New     PT SHORT TERM GOAL #2   Title Pt will perform stand pivot transfer w/ LRAD at S level in order to increase independence at home.     Time 4   Period Weeks   Status New     PT SHORT TERM GOAL #3   Title Pt will perform bed mobility at S level in order to indicate improved functional independence.     Time 4   Period Weeks   Status New     PT SHORT TERM GOAL #4   Title Pt will ambulate w/ LRAD x 150' at min A level in order to indicate improved household ambulation.     Time 4   Period Weeks   Status New     PT SHORT TERM GOAL #5   Title Pt will perform PASS and improve score 3 points in order to indicate improved functional mobility.     Time 4   Period Weeks   Status New           PT Long Term Goals - 02/18/16 1149      PT LONG TERM GOAL #1   Title Pt/spouse will be independent with HEP in order to indicate improved functional mobility and decreased fall risk.  (Target Date: 04/14/16)   Time 8   Period Weeks   Status New     PT LONG TERM GOAL #2   Title Pt will perform stand pivot transfers w/ LRAD at mod I level in order to indicate improved functional independence.    Time 8   Period Weeks   Status New     PT LONG TERM GOAL #3   Title Pt will perform bed mobility at mod I level in order to indicate improved functional mobility.     Time 8   Period Weeks   Status New     PT LONG TERM GOAL #4   Title Pt will ambulate x 150' w/ LRAD at mod I level over indoor surfaces to indicate increased independence with household ambulation.     Time 8   Period Weeks   Status New     PT LONG TERM GOAL #5   Title  Pt will ambulate over paved outdoor surfaces w/ LRAD x 200' at S level in order to indicate initiation of community  ambulation.    Time 8   Period Weeks   Status New               Plan - 03/04/16 1302    Clinical Impression Statement Pt arrived to session with report of significant improvement in room-spinning dizziness. Positional testing for B horizontal and posterior canals negative and completely asymptomatic. Pt experienced no nausea whatsoever during this session. However, midline orientation and postural control during standing/ambulation were considerably more impaire during this session as compared with recent sessions. Suspect pt's system is adjusting to recent episode of BPPV, which appears to have resolved. Gait training performed with RW, L h/o to increase safety/quality of gait pattern while pt's system habituates to standing/ambulation.    Rehab Potential Excellent   Clinical Impairments Affecting Rehab Potential Pt very motivated and able to attend therapy 3x/wk   PT Frequency 3x / week  then 2x/week for 4 weeks   PT Duration 4 weeks  then 2x/week for 4 weeks   PT Treatment/Interventions ADLs/Self Care Home Management;Canalith Repostioning;Electrical Stimulation;DME Instruction;Stair training;Gait training;Functional mobility training;Therapeutic activities;Therapeutic exercise;Balance training;Neuromuscular re-education;Patient/family education;Orthotic Fit/Training;Passive range of motion;Energy conservation;Taping;Vestibular;Visual/perceptual remediation/compensation   PT Next Visit Plan LLE strengthening, sit<>stand. Continue to address tolerance to functional head movement, standing balance, midline orienation, postural control, , L LE NMR.    Consulted and Agree with Plan of Care Patient;Family member/caregiver   Family Member Consulted Husband [name]      Patient will benefit from skilled therapeutic intervention in order to improve the following deficits and  impairments:  Abnormal gait, Decreased activity tolerance, Decreased balance, Decreased coordination, Decreased endurance, Decreased knowledge of use of DME, Decreased mobility, Decreased strength, Dizziness, Impaired perceived functional ability, Impaired flexibility, Impaired tone, Impaired UE functional use, Impaired vision/preception, Improper body mechanics, Postural dysfunction  Visit Diagnosis: Hemiplegia and hemiparesis following cerebral infarction affecting left non-dominant side (HCC)  Unsteadiness on feet  Other abnormalities of gait and mobility  Dizziness and giddiness     Problem List Patient Active Problem List   Diagnosis Date Noted  . 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 TIA (transient ischemic attack) 11/07/2014  . Hypertension 11/07/2014  . DM II (diabetes mellitus, type II), controlled (HCC) 11/07/2014    Jorje Guild, PT, DPT [facility] [address] [place], [place], [address] Phone: [phone]   Fax:  [phone] 03/04/16, 1:07 PM  Name: AUBRIELLA PEREZGARCIA MRN: [id number] Date of Birth: December 28, 1955

## 2016-03-04 NOTE — Therapy (Signed)
[facility] [address] [place], [place], [address] Phone: [phone]   Fax:  [phone]  Occupational Therapy Treatment  Patient Details  Name: Shelly Mcdaniel MRN: [id number] Date of Birth: 01/09/1956 Referring Provider: Dr. Tobie Lords  Encounter Date: 03/04/2016      OT End of Session - 03/04/16 1636    Visit Number 5   Number of Visits 24   Date for OT Re-Evaluation 04/25/16   Authorization Type BC/BS   OT Start Time 1500  Patient running late - called   OT Stop Time 1530   OT Time Calculation (min) 30 min   Activity Tolerance Patient tolerated treatment well   Behavior During Therapy [facility] for tasks assessed/performed      Past Medical History:  Diagnosis Date  . Diabetes mellitus without complication (HCC)   . Hyperlipidemia   . Hypertension     Past Surgical History:  Procedure Laterality Date  . BACK SURGERY    . BREAST SURGERY    . COLONOSCOPY WITH PROPOFOL N/A 12/24/2013   Procedure: COLONOSCOPY WITH PROPOFOL;  Surgeon: Charolett Bumpers, MD;  Location: [facility];  Service: Endoscopy;  Laterality: N/A;    There were no vitals filed for this visit.      Subjective Assessment - 03/04/16 1631    Subjective  My hand is closing more now   Patient is accompained by: Family member   Limitations vestibular deficits (central and peripheral deficits), fall risk, Lt AFO, sh. subluxation   Patient Stated Goals I want my hand to come back   Currently in Pain? No/denies   Pain Score 0-No pain              Vestibular Assessment - 03/04/16 0001      Vestibular Assessment   General Observation --  increased time required for transitional movements     Positional Testing   Sidelying Test Sidelying Right;Sidelying Left   Horizontal Canal Testing Horizontal Canal Right;Horizontal Canal Left     Sidelying Right   Sidelying Right Duration NA   Sidelying Right Symptoms No nystagmus     Sidelying Left   Sidelying Left Duration NA   Sidelying Left Symptoms No nystagmus     Horizontal Canal Right   Horizontal Canal Right Duration NA   Horizontal Canal Right Symptoms Normal     Horizontal Canal Left   Horizontal Canal Left Duration NA   Horizontal Canal Left Symptoms Normal                OT Treatments/Exercises (OP) - 03/04/16 1632      ADLs   Functional Mobility Worked on squat pivot transfers left and right - level surface.  Patient able to safely transitiontoward weaker left side with only set up assistance and close supervision, returning to right, patient needs min assistance.  Patient with better control in lower position than in standing - safer, more independent transfer.       Neurological Re-education Exercises   Other Exercises 1 Neuromuscular reeducation to address postural control as realted to active movement in left arm.  Patient with report of pain in left arm - resolved with improved alignment and trunk, and /or support to weight of humerus while seated or standing.  Patient with improved ability to accept weight onto long left arm, followed with active shoulder and forearm motion.  Shorter session today as patient arrived late.  OT Education - 03/04/16 1635    Education provided Yes   Education Details squat versus stand pivot transfers   Person(s) Educated Patient;Spouse   Methods Explanation;Demonstration;Tactile cues;Verbal cues   Comprehension Need further instruction;Verbal cues required;Tactile cues required          OT Short Term Goals - 02/26/16 1126      OT SHORT TERM GOAL #1   Title Pt/family independent with initial HEP for LUE (All STG's due 03/26/16)    Status On-going     OT SHORT TERM GOAL #2   Title Pt/family to verbalize understanding with proper positioning of LUE during activity and in bed to prevent pain including use of sling prn   Status On-going     OT SHORT TERM GOAL #3   Title Pt  to perform UE dressing with min assist using hemi techniques   Status On-going     OT SHORT TERM GOAL #4   Title Pt to perform functional stand pivot transfer for toileting with no more than mod assist    Status On-going     OT SHORT TERM GOAL #5   Title Pt to be mod assist with LE dressing using hemi techniques and A/E PRN   Status On-going           OT Long Term Goals - 02/26/16 1126      OT LONG TERM GOAL #1   Title Independent with updated HEP prn (all LTG's due 04/25/16)    Status On-going     OT LONG TERM GOAL #2   Title Pt to demo active low range sh. flexion to 25 degrees in prep for reaching and use of arm as stabilizer   Status On-going     OT LONG TERM GOAL #3   Title Pt to demo 50% gross finger flexion/extension to grasp/release cylindrical objects   Status On-going     OT LONG TERM GOAL #4   Title Pt to be supervision level with all BADLS   Status On-going     OT LONG TERM GOAL #5   Title Vision goal TBD    Status New               Plan - 03/04/16 1637    Clinical Impression Statement Patient is showing steady improvement in controlled transitional movement, improved postural control, and reduced report of shoulder pain.    Rehab Potential Good   OT Frequency 3x / week   OT Duration 8 weeks   OT Treatment/Interventions Self-care/ADL training;Moist Heat;DME and/or AE instruction;Splinting;Patient/family education;Therapeutic exercises;Compression bandaging;Therapeutic activities;Neuromuscular education;Functional Mobility Training;Passive range of motion;Cognitive remediation/compensation;Electrical Stimulation;Manual Therapy;Dry needling;Visual/perceptual remediation/compensation   Plan NMR LUE, postural control, controlled transitional movements   Consulted and Agree with Plan of Care Patient;Family member/caregiver   Family Member Consulted Husband - [name]      Patient will benefit from skilled therapeutic intervention in order to improve the  following deficits and impairments:  Decreased activity tolerance, Decreased balance, Decreased cognition, Decreased knowledge of use of DME, Decreased coordination, Decreased endurance, Decreased range of motion, Decreased safety awareness, Decreased skin integrity, Decreased strength, Impaired perceived functional ability, Increased edema, Impaired sensation, Impaired tone, Impaired UE functional use, Impaired vision/preception, Pain, Improper body mechanics  Visit Diagnosis: Hemiplegia and hemiparesis following cerebral infarction affecting left non-dominant side (HCC)  Unsteadiness on feet  Pain in left shoulder  Pain In Left Arm  Other lack of coordination  Visuospatial deficit  Abnormal posture    Problem List Patient Active Problem  List   Diagnosis Date Noted  . 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 TIA (transient ischemic attack) 11/07/2014  . Hypertension 11/07/2014  . DM II (diabetes mellitus, type II), controlled (HCC) 11/07/2014    Collier Salina 03/19/202017, 4:39 PM  [facility] [address] [place], [place], [address] Phone: [phone]   Fax:  [phone]  Name: CHEYNE BUNGERT MRN: [id number] Date of Birth: [birth year]

## 2016-03-08 ENCOUNTER — Ambulatory Visit: Payer: BC Managed Care – PPO | Admitting: Occupational Therapy

## 2016-03-08 ENCOUNTER — Encounter: Payer: Self-pay | Admitting: Occupational Therapy

## 2016-03-08 ENCOUNTER — Ambulatory Visit: Payer: BC Managed Care – PPO | Admitting: Physical Therapy

## 2016-03-08 DIAGNOSIS — R2681 Unsteadiness on feet: Secondary | ICD-10-CM

## 2016-03-08 DIAGNOSIS — I69354 Hemiplegia and hemiparesis following cerebral infarction affecting left non-dominant side: Secondary | ICD-10-CM

## 2016-03-08 DIAGNOSIS — R293 Abnormal posture: Secondary | ICD-10-CM

## 2016-03-08 DIAGNOSIS — R278 Other lack of coordination: Secondary | ICD-10-CM

## 2016-03-08 DIAGNOSIS — R2689 Other abnormalities of gait and mobility: Secondary | ICD-10-CM

## 2016-03-08 DIAGNOSIS — M25512 Pain in left shoulder: Secondary | ICD-10-CM

## 2016-03-08 DIAGNOSIS — M79602 Pain in left arm: Secondary | ICD-10-CM

## 2016-03-08 DIAGNOSIS — R41842 Visuospatial deficit: Secondary | ICD-10-CM

## 2016-03-08 NOTE — Therapy (Signed)
[facility] [address] [place], [place], [address] Phone: [phone]   Fax:  [phone]  Occupational Therapy Treatment  Patient Details  Name: Shelly SimmerJody D Mcdaniel MRN: [id number] Date of Birth: [birth year] Referring Provider: Dr. Tobie LordsLee Shuping  Encounter Date: 03/08/2016      OT End of Session - 03/08/16 1116    Visit Number 6   Number of Visits 24   Date for OT Re-Evaluation 04/25/16   Authorization Type BC/BS   OT Start Time 0932   OT Stop Time 1016   OT Time Calculation (min) 44 min   Activity Tolerance Patient tolerated treatment well   Behavior During Therapy [facility] for tasks assessed/performed      Past Medical History:  Diagnosis Date  . Diabetes mellitus without complication (HCC)   . Hyperlipidemia   . Hypertension     Past Surgical History:  Procedure Laterality Date  . BACK SURGERY    . BREAST SURGERY    . COLONOSCOPY WITH PROPOFOL N/A 12/24/2013   Procedure: COLONOSCOPY WITH PROPOFOL;  Surgeon: Charolett BumpersMartin K Johnson, MD;  Location: [facility];  Service: Endoscopy;  Laterality: N/A;    There were no vitals filed for this visit.      Subjective Assessment - 03/08/16 1044    Subjective  My arm is sore since I fell.  Patient fell in attempt to get out of bed to bedside commode on Friday.     Patient is accompained by: Family member   Limitations vestibular deficits (central and peripheral deficits), fall risk, Lt AFO, sh. subluxation   Patient Stated Goals I want my hand to come back   Currently in Pain? Yes   Pain Score 4    Pain Location Shoulder   Pain Orientation Left   Pain Type Chronic pain   Pain Onset More than a month ago   Pain Frequency Intermittent   Aggravating Factors  malalignment, fast or unexpected movements   Pain Relieving Factors medication, repositioning, support   Multiple Pain Sites No                      OT Treatments/Exercises (OP) - 03/08/16 0001      ADLs   Toileting Patient fell with attempt to transfer herself to bedside commode from bed.  Discussed with patient and husband new set up of commode (they believe it is  adropa rm commode - but did not understand this feature.)  Have been working with patient on safe squat pivot transfers with increased independence.  Patient has a tendency to want to stand and pivot which is not yet as safe for independent performance in the near future.  Patient's husband concerned about the height of the rope bed that patient sleeps in as being an obstacle to her independnece as he is pursuing a newer lower profile mattress.     Functional Mobility Worked on squat pivot level surface transfers to address safety and increased autonomy.  Patient needed only min assist for set up of staggered feet and head / hip orientation.  Patient able to transition left and right at a more reasonable speed to increase safety.       Neurological Re-education Exercises   Other Exercises 1 Neuromuscular reeducation to address shoulder pain and gentle mobility.  Supine to address beginning isolated control within and outside of synergistic movement pattern.  Patient and husband have had little opportunity to complete range of motion exercise program.  Husband indicates  that she is apprehensive with him.  Discussed at length the importance of gentle mobility to aleviate pain and promote shoulder mobility.  Husband has demonstrated competence here in OT, and encouraged them to begin a daily routine.                  OT Education - 03/08/16 1113    Education provided Yes   Education Details importance of daily range of motion to reduce shoulder pain, promote better mobility   Person(s) Educated Patient;Spouse   Methods Explanation;Demonstration;Tactile cues;Verbal cues   Comprehension Verbalized understanding          OT Short Term Goals - 03/08/16 1118      OT SHORT TERM GOAL #1   Title Pt/family independent with  initial HEP for LUE (All STG's due 03/26/16)    Status On-going     OT SHORT TERM GOAL #2   Title Pt/family to verbalize understanding with proper positioning of LUE during activity and in bed to prevent pain including use of sling prn   Status Achieved     OT SHORT TERM GOAL #3   Title Pt to perform UE dressing with min assist using hemi techniques   Status On-going     OT SHORT TERM GOAL #4   Title Pt to perform functional stand pivot transfer for toileting with no more than mod assist    Status On-going     OT SHORT TERM GOAL #5   Title Pt to be mod assist with LE dressing using hemi techniques and A/E PRN   Status On-going           OT Long Term Goals - 02/26/16 1126      OT LONG TERM GOAL #1   Title Independent with updated HEP prn (all LTG's due 04/25/16)    Status On-going     OT LONG TERM GOAL #2   Title Pt to demo active low range sh. flexion to 25 degrees in prep for reaching and use of arm as stabilizer   Status On-going     OT LONG TERM GOAL #3   Title Pt to demo 50% gross finger flexion/extension to grasp/release cylindrical objects   Status On-going     OT LONG TERM GOAL #4   Title Pt to be supervision level with all BADLS   Status On-going     OT LONG TERM GOAL #5   Title Vision goal TBD    Status New               Plan - 03/08/16 1117    Clinical Impression Statement Patient is improving in clinic with safe transtional movements, improved postural control, and beginnign active movement in left arm.    Rehab Potential Good   OT Frequency 3x / week   OT Duration 8 weeks   OT Treatment/Interventions Self-care/ADL training;Moist Heat;DME and/or AE instruction;Splinting;Patient/family education;Therapeutic exercises;Compression bandaging;Therapeutic activities;Neuromuscular education;Functional Mobility Training;Passive range of motion;Cognitive remediation/compensation;Electrical Stimulation;Manual Therapy;Dry needling;Visual/perceptual  remediation/compensation   Plan NMR LUE, drop arm commode transfers, transitional movements   Consulted and Agree with Plan of Care Patient;Family member/caregiver   Family Member Consulted Husband - [name]      Patient will benefit from skilled therapeutic intervention in order to improve the following deficits and impairments:  Decreased activity tolerance, Decreased balance, Decreased cognition, Decreased knowledge of use of DME, Decreased coordination, Decreased endurance, Decreased range of motion, Decreased safety awareness, Decreased skin integrity, Decreased strength, Impaired perceived functional ability, Increased edema, Impaired  sensation, Impaired tone, Impaired UE functional use, Impaired vision/preception, Pain, Improper body mechanics  Visit Diagnosis: Hemiplegia and hemiparesis 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 TIA (transient ischemic attack) 11/07/2014  . Hypertension 11/07/2014  . DM II (diabetes mellitus, type II), controlled (HCC) 11/07/2014    Collier Salina, OTR/L 03/08/2016, 11:19 AM  [facility] [address] [place], [place], [address] Phone: [phone]   Fax:  [phone]  Name: ZULEIKA GALLUS MRN: [id number] Date of Birth: 09-24-1955

## 2016-03-08 NOTE — Therapy (Signed)
[facility] [address] [place], [place], [address] Phone: [phone]   Fax:  [phone]  Physical Therapy Treatment  Patient Details  Name: Shelly Mcdaniel MRN: [id number] Date of Birth: 30-Dec-1955 Referring Provider: Lubertha South, MD  Encounter Date: 03/08/2016      PT End of Session - 03/08/16 1307    Visit Number 8   Number of Visits 21   Date for PT Re-Evaluation 04/18/16   Authorization Type BCBS 0 visit limit, 0 auth   PT Start Time [phone]   PT Stop Time 0930   PT Time Calculation (min) 41 min   Activity Tolerance Patient tolerated treatment well   Behavior During Therapy [facility] for tasks assessed/performed      Past Medical History:  Diagnosis Date  . Diabetes mellitus without complication (HCC)   . Hyperlipidemia   . Hypertension     Past Surgical History:  Procedure Laterality Date  . BACK SURGERY    . BREAST SURGERY    . COLONOSCOPY WITH PROPOFOL N/A 12/24/2013   Procedure: COLONOSCOPY WITH PROPOFOL;  Surgeon: Charolett Bumpers, MD;  Location: [facility];  Service: Endoscopy;  Laterality: N/A;    There were no vitals filed for this visit.      Subjective Assessment - 03/08/16 0851    Subjective Pt fell out of bed again last week. Husband was at work and pt needed to go to the bathroom, so she tried to transfer self to bedside commode. Pt's husband came home a few minutes later and helped her out of the floor. Pt was not injured. Pt has been ambulating in home with [facility].    Patient is accompained by: Family member   Limitations House hold activities;Walking   Currently in Pain? No/denies                         [facility] Adult PT Treatment/Exercise - 03/08/16 0001      Bed Mobility   Bed Mobility Supine to Sit;Sit to Supine   Supine to Sit 4: Min guard;5: Supervision   Supine to Sit Details (indicate cue type and reason) Pt/husband demonstrated how pt gets into/out of  (high) bed at home. Husband demonstrates stabilizing at anterior aspect of knees, as pt positions self into supine, then husband manages LE's. With PT cueing, pt able to effectively performed supine > sit with logroll technique, initially with min guard, 50% cueing, then with (S) and 25% cueing.   Sit to Supine 4: Min guard;5: Supervision   Sit to Supine - Details (indicate cue type and reason) With verbal/demo cueing from PT, pt performed sit > supine (using logroll technique) with min guard then supervision.     Transfers   Transfers Sit to Stand;Stand to Sit   Sit to Stand 4: Min guard   Sit to Stand Details (indicate cue type and reason) Required cueing for L foot placement. Performed multiple sit <> stands from EOM with min guard.   Stand to Sit 4: Min guard   Stand Pivot Transfers 3: Mod assist;4: Min assist;4: Min Scientist, clinical (histocompatibility and immunogenetics) Details (indicate cue type and reason) x4 trials from w/c <> mat table with [facility], initially with mod A for postural control during pivoting; with cueing for decreased speed of movement, smaller steps (during pivoting), pt able to perform final trial with min guard.   Squat Pivot Transfers 4: Min assist;4: Min guard;5: Supervision   Squat  Pivot Transfer Details (indicate cue type and reason) level transfer from w/c <> mat table progressing from min A to (S) in B directions. Cueing emphasized setup (orienting feet, knees, shoulders) to enable pt to transfer with minimal pivoting/foot movement with effective within-session carryover.      Ambulation/Gait   Ambulation/Gait Yes   Ambulation/Gait Assistance 4: Min assist   Ambulation/Gait Assistance Details x60' with min A of husband with husband effectively providing cueing (for wider BOS) and assistance.   Ambulation Distance (Feet) 60 Feet  distance limited by pt fatigue   Assistive device Large base quad cane;Other (Comment)  L GivMorh sling   Gait Pattern Step-through pattern;Decreased stride  length;Decreased stance time - left;Decreased hip/knee flexion - left;Decreased dorsiflexion - left;Lateral trunk lean to right;Trunk flexed;Decreased arm swing - left;Decreased step length - right;Decreased weight shift to left;Narrow base of support;Scissoring;Ataxic   Ambulation Surface Level;Indoor                  PT Short Term Goals - 03/08/16 0904      PT SHORT TERM GOAL #1   Title Pt will initiate HEP in order to indicate improved functional mobility and decreased fall risk.  (Target Date: 03/17/16)   Time 4   Period Weeks   Status New     PT SHORT TERM GOAL #2   Title Pt will perform stand pivot transfer w/ LRAD at S level in order to increase independence at home.     Time 4   Period Weeks   Status New     PT SHORT TERM GOAL #3   Title Pt will perform bed mobility at S level in order to indicate improved functional independence.     Time 4   Period Weeks   Status New     PT SHORT TERM GOAL #4   Title Pt will ambulate w/ LRAD x 150' at min A level in order to indicate improved household ambulation.     Time 4   Period Weeks   Status New     PT SHORT TERM GOAL #5   Title Pt will perform PASS and improve score 3 points in order to indicate improved functional mobility.     Time 4   Period Weeks   Status New           PT Long Term Goals - 02/18/16 1149      PT LONG TERM GOAL #1   Title Pt/spouse will be independent with HEP in order to indicate improved functional mobility and decreased fall risk.  (Target Date: 04/14/16)   Time 8   Period Weeks   Status New     PT LONG TERM GOAL #2   Title Pt will perform stand pivot transfers w/ LRAD at mod I level in order to indicate improved functional independence.    Time 8   Period Weeks   Status New     PT LONG TERM GOAL #3   Title Pt will perform bed mobility at mod I level in order to indicate improved functional mobility.     Time 8   Period Weeks   Status New     PT LONG TERM GOAL #4   Title Pt  will ambulate x 150' w/ LRAD at mod I level over indoor surfaces to indicate increased independence with household ambulation.     Time 8   Period Weeks   Status New     PT LONG TERM GOAL #5  Title Pt will ambulate over paved outdoor surfaces w/ LRAD x 200' at S level in order to indicate initiation of community ambulation.    Time 8   Period Weeks   Status New               Plan - 03/08/16 1308    Clinical Impression Statement Session focused on review of technique with bed mobility, squat pivot transfers, and stand pivot transfers using LBQC. Husband demonstrated effective between-session carryover of safe assist technique, appropriate cueing with gait x60' using LBQC.    Rehab Potential Excellent   Clinical Impairments Affecting Rehab Potential Pt very motivated and able to attend therapy 3x/wk   PT Frequency 3x / week  then 2x/week for 4 weeks   PT Duration 4 weeks  then 2x/week for 4 weeks   PT Treatment/Interventions ADLs/Self Care Home Management;Canalith Repostioning;Electrical Stimulation;DME Instruction;Stair training;Gait training;Functional mobility training;Therapeutic activities;Therapeutic exercise;Balance training;Neuromuscular re-education;Patient/family education;Orthotic Fit/Training;Passive range of motion;Energy conservation;Taping;Vestibular;Visual/perceptual remediation/compensation   PT Next Visit Plan LLE strengthening, postural control during stand pivot transfers with [facility], sit<>stand. Continue to address tolerance to functional head movement, standing balance, midline orienation, postural control, , L LE NMR.    Consulted and Agree with Plan of Care Patient;Family member/caregiver   Family Member Consulted Husband [name]      Patient will benefit from skilled therapeutic intervention in order to improve the following deficits and impairments:  Abnormal gait, Decreased activity tolerance, Decreased balance, Decreased coordination, Decreased endurance,  Decreased knowledge of use of DME, Decreased mobility, Decreased strength, Dizziness, Impaired perceived functional ability, Impaired flexibility, Impaired tone, Impaired UE functional use, Impaired vision/preception, Improper body mechanics, Postural dysfunction  Visit Diagnosis: Hemiplegia and hemiparesis following cerebral infarction affecting left non-dominant side (HCC)  Other abnormalities of gait and mobility  Unsteadiness on feet     Problem List Patient Active Problem List   Diagnosis Date Noted  . 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 TIA (transient ischemic attack) 11/07/2014  . Hypertension 11/07/2014  . DM II (diabetes mellitus, type II), controlled (HCC) 11/07/2014    Jorje Guild, PT, DPT [facility] [address] [place], [place], [address] Phone: [phone]   Fax:  [phone] 03/08/16, 1:13 PM  Name: Shelly Mcdaniel MRN: [id number] Date of Birth: 02/26/1956

## 2016-03-09 ENCOUNTER — Ambulatory Visit: Payer: BC Managed Care – PPO | Admitting: Physical Therapy

## 2016-03-09 ENCOUNTER — Encounter: Payer: Self-pay | Admitting: Occupational Therapy

## 2016-03-09 ENCOUNTER — Ambulatory Visit: Payer: BC Managed Care – PPO | Admitting: Occupational Therapy

## 2016-03-09 DIAGNOSIS — R2681 Unsteadiness on feet: Secondary | ICD-10-CM

## 2016-03-09 DIAGNOSIS — I69354 Hemiplegia and hemiparesis following cerebral infarction affecting left non-dominant side: Secondary | ICD-10-CM

## 2016-03-09 DIAGNOSIS — R293 Abnormal posture: Secondary | ICD-10-CM

## 2016-03-09 DIAGNOSIS — M25512 Pain in left shoulder: Secondary | ICD-10-CM

## 2016-03-09 DIAGNOSIS — M79602 Pain in left arm: Secondary | ICD-10-CM

## 2016-03-09 DIAGNOSIS — R2689 Other abnormalities of gait and mobility: Secondary | ICD-10-CM

## 2016-03-09 DIAGNOSIS — R278 Other lack of coordination: Secondary | ICD-10-CM

## 2016-03-09 DIAGNOSIS — R41842 Visuospatial deficit: Secondary | ICD-10-CM

## 2016-03-09 NOTE — Therapy (Signed)
[facility] [address] [place], [place], [address] Phone: [phone]   Fax:  [phone]  Physical Therapy Treatment  Patient Details  Name: Shelly Mcdaniel MRN: [id number] Date of Birth: [birth year] Referring Provider: Juanda Bond, MD  Encounter Date: 03/09/2016      PT End of Session - 03/09/16 1341    Visit Number 9   Number of Visits 21   Date for PT Re-Evaluation 04/18/16   PT Start Time 1232   PT Stop Time 1314   PT Time Calculation (min) 42 min   Equipment Utilized During Treatment Gait belt   Activity Tolerance Patient tolerated treatment well;Patient limited by fatigue;Other (comment)  Activity tolerance improving, but gait distance continues to be limited by fatigue   Behavior During Therapy [facility] for tasks assessed/performed      Past Medical History:  Diagnosis Date  . Diabetes mellitus without complication ([place])   . Hyperlipidemia   . Hypertension     Past Surgical History:  Procedure Laterality Date  . BACK SURGERY    . BREAST SURGERY    . COLONOSCOPY WITH PROPOFOL N/A 12/24/2013   Procedure: COLONOSCOPY WITH PROPOFOL;  Surgeon: [name], MD;  Location: [facility];  Service: Endoscopy;  Laterality: N/A;    There were no vitals filed for this visit.      Subjective Assessment - 03/09/16 1333    Subjective Pt reports no significant changes, no falls.    Patient is accompained by: Family member  husband, Shelly Mcdaniel   Limitations House hold activities;Walking   Currently in Pain? Yes   Pain Score 3    Pain Location Rib cage   Pain Orientation Left   Pain Descriptors / Indicators Dull   Pain Type Chronic pain   Pain Onset 1 to 4 weeks ago   Aggravating Factors  when lying on L side   Pain Relieving Factors repositioning into supine or R sidelying   Multiple Pain Sites No                         [facility] Adult PT Treatment/Exercise - 03/09/16 0001      Bed  Mobility   Bed Mobility Supine to Sit;Sit to Supine   Supine to Sit 6: Modified independent (Device/Increase time)   Sit to Supine 6: Modified independent (Device/Increase time)     Transfers   Transfers Sit to Stand;Stand to Sit   Sit to Stand 5: Supervision   Sit to Stand Details (indicate cue type and reason) cueing for L foot placement; from mat table with [facility]   Stand to Sit 5: Supervision;4: Min guard   Stand to Sit Details to mat table with [facility]   Stand Pivot Transfers 4: Min guard;4: Min Arts development officer Details (indicate cue type and reason) from w/c <> mat table x2 trials with [facility]; cueing for slow, intentional movement and small steps to improve grading/control of weight shifting   Squat Pivot Transfers 5: Supervision;6: Modified independent (Device/Increase time)   Squat Pivot Transfer Details (indicate cue type and reason) x2 trials; initially with (S)/cueing for hand placement on w/c arm rest when transferring to R. Subsequent trial with mod I, no cueing to transfer in B directions.     Ambulation/Gait   Ambulation/Gait Yes   Ambulation/Gait Assistance 4: Min assist;3: Mod assist   Ambulation/Gait Assistance Details x115' with [facility] with PT providing min A during majority of gait  trial, mod A to control weight shifting during turning to sit down at end of trial due to fatigue. Cueing during gait focused on decreasing LLE step length (as smaller step elicits less trunk retropulsion), wider BOS, decreased step length (emphasis on L), and increased L hip extension/pelvic protraction during L stance to prevent L genu recurvatum.   Ambulation Distance (Feet) 115 Feet  distance limited by pt fatigue   Assistive device Large base quad cane;Other (Comment)  L GivMorh sling   Gait Pattern Step-through pattern;Decreased stance time - left;Decreased hip/knee flexion - left;Decreased dorsiflexion - left;Lateral trunk lean to right;Decreased arm swing - left;Decreased weight  shift to left;Narrow base of support;Left circumduction;Left genu recurvatum;Ataxic   Ambulation Surface Level;Indoor     Neuro Re-ed    Neuro Re-ed Details  Seated in manual w/c, pt performed retro self-propulsion of w/c with LLE only x45' for increased quadriceps activation, improved selective control of L knee flexion. Tactile cueing at L knee for LLE WB, at L distal hamstrings for activation during knee flexion.                PT Education - 03/09/16 1334    Education provided Yes   Education Details Importance of slow, intentional movement and small steps during stand-pivot transfers and ambulation.    Person(s) Educated Patient;Spouse   Methods Explanation;Demonstration;Verbal cues   Comprehension Verbalized understanding;Need further instruction          PT Short Term Goals - 03/09/16 1343      PT SHORT TERM GOAL #1   Title Pt will initiate HEP in order to indicate improved functional mobility and decreased fall risk.  (Target Date: 03/17/16)   Baseline Met 9/27.   Time 4   Period Weeks   Status Achieved     PT SHORT TERM GOAL #2   Title Pt will perform stand pivot transfer w/ LRAD at S level in order to increase independence at home.     Time 4   Period Weeks   Status On-going     PT SHORT TERM GOAL #3   Title Pt will perform bed mobility at S level in order to indicate improved functional independence.     Baseline Met 9/27.   Time 4   Period Weeks   Status Achieved     PT SHORT TERM GOAL #4   Title Pt will ambulate w/ LRAD x 150' at min A level in order to indicate improved household ambulation.     Time 4   Period Weeks   Status On-going     PT SHORT TERM GOAL #5   Title Pt will perform PASS and improve score 3 points in order to indicate improved functional mobility.     Time 4   Period Weeks   Status On-going           PT Long Term Goals - 02/18/16 1149      PT LONG TERM GOAL #1   Title Pt/spouse will be independent with HEP in order to  indicate improved functional mobility and decreased fall risk.  (Target Date: 04/14/16)   Time 8   Period Weeks   Status New     PT LONG TERM GOAL #2   Title Pt will perform stand pivot transfers w/ LRAD at mod I level in order to indicate improved functional independence.    Time 8   Period Weeks   Status New     PT LONG TERM GOAL #3  Title Pt will perform bed mobility at mod I level in order to indicate improved functional mobility.     Time 8   Period Weeks   Status New     PT LONG TERM GOAL #4   Title Pt will ambulate x 150' w/ LRAD at mod I level over indoor surfaces to indicate increased independence with household ambulation.     Time 8   Period Weeks   Status New     PT LONG TERM GOAL #5   Title Pt will ambulate over paved outdoor surfaces w/ LRAD x 200' at S level in order to indicate initiation of community ambulation.    Time 8   Period Weeks   Status New               Plan - 03/09/16 1346    Clinical Impression Statement Session focused on beginning to assess STG's and on reviewing technique with aspects of functional mobility performed at last session. Pt demonstrated effective between-session carryover of logroll technique with supine <> sit, and performed bed mobility with mod I during this session. Squat pivot transfers performed with (S) to mod I, and pt improved ambulation distance with [facility] from 60' to 115'. Pt continues to demo impaired grading/control of lateral weight shift to L, which is limiting pt independence with stand pivot transfers and ambulation.    Rehab Potential Excellent   Clinical Impairments Affecting Rehab Potential Pt very motivated and able to attend therapy 3x/wk   PT Frequency 3x / week  then 2x/week for 4 weeks   PT Duration 4 weeks  then 2x/week for 4 weeks   PT Treatment/Interventions ADLs/Self Care Home Management;Canalith Repostioning;Electrical Stimulation;DME Instruction;Stair training;Gait training;Functional mobility  training;Therapeutic activities;Therapeutic exercise;Balance training;Neuromuscular re-education;Patient/family education;Orthotic Fit/Training;Passive range of motion;Energy conservation;Taping;Vestibular;Visual/perceptual remediation/compensation   PT Next Visit Plan Finish checking STG's (PASS, stand pivot transfers, and ambulation). Continue to focus on grading/control of lateral weight shift during stand pivot transfers, gait training with [facility].    Recommended Other Services Let husband, Shelly Mcdaniel, know by 10/6 if L AFO should be articulated. As of 9/27, L quad control doesn't appear adequate for this.   Consulted and Agree with Plan of Care Patient;Family member/caregiver   Family Member Consulted Husband Shelly Mcdaniel      Patient will benefit from skilled therapeutic intervention in order to improve the following deficits and impairments:  Abnormal gait, Decreased activity tolerance, Decreased balance, Decreased coordination, Decreased endurance, Decreased knowledge of use of DME, Decreased mobility, Decreased strength, Dizziness, Impaired perceived functional ability, Impaired flexibility, Impaired tone, Impaired UE functional use, Impaired vision/preception, Improper body mechanics, Postural dysfunction  Visit Diagnosis: Hemiplegia and hemiparesis following cerebral infarction affecting left non-dominant side (HCC)  Unsteadiness on feet  Other abnormalities of gait and mobility     Problem List Patient Active Problem List   Diagnosis Date Noted  . 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 TIA (transient ischemic attack) 11/07/2014  . Hypertension 11/07/2014  . DM II (diabetes mellitus, type II), controlled ([place]) 11/07/2014    Billie Ruddy, PT, [facility] [address] [place], [place], [address] Phone: [phone]   Fax:  [phone] 03/09/16, 1:52 PM  Name: Shelly Mcdaniel MRN: [id number] Date of Birth: 06-Sep-1955

## 2016-03-09 NOTE — Therapy (Signed)
[facility] [address] [place], [place], [address] Phone: [phone]   Fax:  [phone]  Occupational Therapy Treatment  Patient Details  Name: Shelly Mcdaniel MRN: [id number] Date of Birth: Oct 15, 1955 Referring Provider: Dr. Tobie Lords  Encounter Date: 03/09/2016      OT End of Session - 03/09/16 1448    Visit Number 7   Number of Visits 24   Date for OT Re-Evaluation 04/25/16   Authorization Type BC/BS   OT Start Time 1145   OT Stop Time 1230   OT Time Calculation (min) 45 min   Activity Tolerance Patient tolerated treatment well   Behavior During Therapy [facility] for tasks assessed/performed      Past Medical History:  Diagnosis Date  . Diabetes mellitus without complication (HCC)   . Hyperlipidemia   . Hypertension     Past Surgical History:  Procedure Laterality Date  . BACK SURGERY    . BREAST SURGERY    . COLONOSCOPY WITH PROPOFOL N/A 12/24/2013   Procedure: COLONOSCOPY WITH PROPOFOL;  Surgeon: Charolett Bumpers, MD;  Location: [facility];  Service: Endoscopy;  Laterality: N/A;    There were no vitals filed for this visit.      Subjective Assessment - 03/09/16 1444    Subjective  No falls   Limitations vestibular deficits (central and peripheral deficits), fall risk, Lt AFO, sh. subluxation   Patient Stated Goals I want my hand to come back   Currently in Pain? No/denies   Pain Score 0-No pain                      OT Treatments/Exercises (OP) - 03/09/16 1445      ADLs   LB Dressing Patient able to don left slip on shoe with increased time.  Patient's husband states - 'I have never seen you do this."   Functional Mobility Transferred level surface to left without physical assistance, even for set up.  Minimal initial cueing     Neurological Re-education Exercises   Other Exercises 1 Neuromuscular reeducation to address isolated control in left upper extremity. Patient with  more distal movement evident today in elbow, forearm, and thumb.  Worked in supine to increase visual attention to left,a nd to decrease substitution movement in trunk.  Place and hold with left arm on dowel at 90 degrees of shoulder flexion with elbow extension, able to sustain for three count.                  OT Education - 03/09/16 1448    Education provided Yes   Education Details slow forward weight shift for transfer   Person(s) Educated Patient;Spouse   Methods Explanation;Demonstration;Verbal cues   Comprehension Verbalized understanding;Returned demonstration          OT Short Term Goals - 03/08/16 1118      OT SHORT TERM GOAL #1   Title Pt/family independent with initial HEP for LUE (All STG's due 03/26/16)    Status On-going     OT SHORT TERM GOAL #2   Title Pt/family to verbalize understanding with proper positioning of LUE during activity and in bed to prevent pain including use of sling prn   Status Achieved     OT SHORT TERM GOAL #3   Title Pt to perform UE dressing with min assist using hemi techniques   Status On-going     OT SHORT TERM GOAL #4   Title Pt to  perform functional stand pivot transfer for toileting with no more than mod assist    Status On-going     OT SHORT TERM GOAL #5   Title Pt to be mod assist with LE dressing using hemi techniques and A/E PRN   Status On-going           OT Long Term Goals - 02/26/16 1126      OT LONG TERM GOAL #1   Title Independent with updated HEP prn (all LTG's due 04/25/16)    Status On-going     OT LONG TERM GOAL #2   Title Pt to demo active low range sh. flexion to 25 degrees in prep for reaching and use of arm as stabilizer   Status On-going     OT LONG TERM GOAL #3   Title Pt to demo 50% gross finger flexion/extension to grasp/release cylindrical objects   Status On-going     OT LONG TERM GOAL #4   Title Pt to be supervision level with all BADLS   Status On-going     OT LONG TERM GOAL #5    Title Vision goal TBD    Status New               Plan - 03/09/16 1448    Clinical Impression Statement Patient showing improved active movement, not yet functional in left upper extremity.     Rehab Potential Good   OT Frequency 3x / week   OT Duration 8 weeks   OT Treatment/Interventions Self-care/ADL training;Moist Heat;DME and/or AE instruction;Splinting;Patient/family education;Therapeutic exercises;Compression bandaging;Therapeutic activities;Neuromuscular education;Functional Mobility Training;Passive range of motion;Cognitive remediation/compensation;Electrical Stimulation;Manual Therapy;Dry needling;Visual/perceptual remediation/compensation   Plan [facility], transitional movements   Consulted and Agree with Plan of Care Patient;Family member/caregiver   Family Member Consulted Husband - [name]      Patient will benefit from skilled therapeutic intervention in order to improve the following deficits and impairments:  Decreased activity tolerance, Decreased balance, Decreased cognition, Decreased knowledge of use of DME, Decreased coordination, Decreased endurance, Decreased range of motion, Decreased safety awareness, Decreased skin integrity, Decreased strength, Impaired perceived functional ability, Increased edema, Impaired sensation, Impaired tone, Impaired UE functional use, Impaired vision/preception, Pain, Improper body mechanics  Visit Diagnosis: Hemiplegia and hemiparesis 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 TIA (transient ischemic attack) 11/07/2014  . Hypertension 11/07/2014  . DM II (diabetes mellitus, type II), controlled (HCC) 11/07/2014    Collier SalinaGellert, Kristin M,  OTR/L 03/09/2016, 2:50 PM  [facility] [address] [place], [place], [address] Phone: [phone]   Fax:  [phone]  Name: Wendie SimmerJody D Spilman MRN: [id number] Date of Birth: 1955/12/22

## 2016-03-10 ENCOUNTER — Encounter: Payer: Self-pay | Admitting: Physical Therapy

## 2016-03-10 ENCOUNTER — Ambulatory Visit: Payer: BC Managed Care – PPO | Admitting: Physical Therapy

## 2016-03-10 ENCOUNTER — Ambulatory Visit: Payer: BC Managed Care – PPO | Admitting: Occupational Therapy

## 2016-03-10 DIAGNOSIS — I69354 Hemiplegia and hemiparesis following cerebral infarction affecting left non-dominant side: Secondary | ICD-10-CM | POA: Diagnosis not present

## 2016-03-10 DIAGNOSIS — R2681 Unsteadiness on feet: Secondary | ICD-10-CM

## 2016-03-10 DIAGNOSIS — M25512 Pain in left shoulder: Secondary | ICD-10-CM

## 2016-03-10 DIAGNOSIS — R2689 Other abnormalities of gait and mobility: Secondary | ICD-10-CM

## 2016-03-10 DIAGNOSIS — R293 Abnormal posture: Secondary | ICD-10-CM

## 2016-03-10 NOTE — Therapy (Signed)
[facility] [address] [place], [place], [address] Phone: [phone]   Fax:  [phone]  Physical Therapy Treatment  Patient Details  Name: Shelly Mcdaniel MRN: [id number] Date of Birth: 1956/04/12 Referring Provider: Juanda Bond, MD  Encounter Date: 03/10/2016      PT End of Session - 03/10/16 0935    Visit Number 10   Number of Visits 21   Date for PT Re-Evaluation 04/18/16   Authorization Type BCBS 0 visit limit, 0 auth   PT Start Time 0933   PT Stop Time 1015   PT Time Calculation (min) 42 min   Equipment Utilized During Treatment Gait belt   Activity Tolerance Patient tolerated treatment well;Patient limited by fatigue;Other (comment)  Activity tolerance improving, but gait distance continues to be limited by fatigue   Behavior During Therapy [facility] for tasks assessed/performed      Past Medical History:  Diagnosis Date  . Diabetes mellitus without complication ([place])   . Hyperlipidemia   . Hypertension     Past Surgical History:  Procedure Laterality Date  . BACK SURGERY    . BREAST SURGERY    . COLONOSCOPY WITH PROPOFOL N/A 12/24/2013   Procedure: COLONOSCOPY WITH PROPOFOL;  Surgeon: [name], MD;  Location: [facility];  Service: Endoscopy;  Laterality: N/A;    There were no vitals filed for this visit.      Subjective Assessment - 03/10/16 0933    Subjective Spouse reports they were able adjust the bed down to 28 inches height which is easier for pt to manage at home now. Pt is reporting she just had some dizziness when lying down similar to the vertigo she had before.    Patient is accompained by: Family member  husband, Shelly Mcdaniel   Limitations House hold activities;Walking   Currently in Pain? Yes   Pain Score 3    Pain Location Shoulder   Pain Orientation Left   Pain Descriptors / Indicators Aching;Sharp   Pain Type Chronic pain   Pain Onset More than a month ago   Pain  Frequency Intermittent   Aggravating Factors  with movements, lying on left side   Pain Relieving Factors rest, repositioning      Postural Assessment Scale for Stroke Patients (PASS)  Give the subject instructions for each item as written below. When scoring the item, record the lowest response category that applies for each item.  Maintaining a Posture  _3_ 1. Sitting Without Support Instructions: Have the subject sit on a bench/mat without back support and with feet flat on the floor. (3) Can sit for 5 minutes without support (2) Can sit for more than 10 seconds without support (1) Can sit with slight support (for example, by 1 hand) (0) Cannot sit  3__ 2. Standing With Support Instructions: Have the subject stand, providing support as needed. Evaluate only the ability to stand with or without support. Do not consider the quality of the stance. (3) Can stand with support of only 1 hand (2) Can stand with moderate support of 1 person (1) Can stand with strong support of 2 people (0) Cannot stand, even with support  _1_ 3. Standing Without Support Instructions: Have the subject stand without support. Evaluate only the ability to stand with or without support. Do not consider the quality of the stance. (3) Can stand without support for more than 1 minute and simultaneously perform arm movements at about shoulder level (2) Can stand without support  for 1 minute or stands slightly asymmetrically  (1) Can stand without support for 10 seconds or leans heavily on 1 leg (0) Cannot stand without support  _2_ 4. Standing on Nonparetic Leg Instructions: Have the subject stand on the nonparetic leg. Evaluate only the ability to bear weight entirely on the nonparetic leg. Do not consider how the subject accomplishes the task. (3) Can stand on nonparetic leg for more than 10 seconds (2) Can stand on nonparetic leg for more than 5 seconds (1) Can stand on nonparetic leg for a few seconds (0)  Cannot stand on nonparetic leg  _0_ 5. Standing on Paretic Leg Instructions: Have the subject stand on the paretic leg. Evaluate only the ability to bear weight entirely on the paretic leg. Do not consider how the subject accomplishes the task. (3) Can stand on paretic leg for more than 10 seconds (2) Can stand on paretic leg for more than 5 seconds (1) Can stand on paretic leg for a few seconds (0) Cannot stand on paretic leg  Maintaining Posture SUBTOTAL ___9_____  Changing a Posture  _3_ 6. Supine to Paretic Side Lateral Instructions: Begin with the subject in supine on a treatment mat. Instruct the subject to roll to the paretic side (lateral movement). Assist as necessary. Evaluate the subject's performance on the amount of help required. Do not consider the quality of performance. (3) Can perform without help (2) Can perform with little help (1) Can perform with much help (0) Cannot perform  _3_ 7. Supine to Nonparetic Side Lateral Instructions: Begin with the subject in supine on a treatment mat. Instruct the subject to roll to the nonparetic side (lateral movement). Assist as necessary. Evaluate the subject's performance on the amount of help required. Do not consider the quality of performance. (3) Can perform without help (2) Can perform with little help (1) Can perform with much help (0) Cannot perform  _3_ 8. Supine to Sitting Up on the Edge of the Mat Instructions: Begin with the subject in supine on a treatment mat. Instruct the subject to come to sitting on the edge of the mat. Assist as necessary. Evaluate the subject's performance on the amount of help required. Do not consider the quality of performance. (3) Can perform without help (2) Can perform with little help (1) Can perform with much help (0) Cannot perform  _2_ 9. Sitting on the Edge of the Mat to Supine Instructions: Begin with the on the edge of a treatment mat. Instruct the subject to return to supine.  Assist as necessary. Evaluate the subject's performance on the amount of help required. Do not consider the quality of performance. (3) Can perform without help (2) Can perform with little help (1) Can perform with much help (0) Cannot perform  _3_ 10. Sitting to Standing Up Instructions: Begin with the subject sitting on the edge of a treatment mat. Instruct the subject to stand up without support. Assist if necessary. Evaluate the subject's performance on the amount of help required. Do not consider the quality of performance. (3) Can perform without help (2) Can perform with little help (1) Can perform with much help (0) Cannot perform  _2_ 11. Standing Up to Sitting Down Instructions: Begin with the subject standing by edge of a treatment mat. Instruct the subject to sit on edge of mat without support. Assist if necessary. Evaluate the subject's performance on the amount of help required. Do not consider the quality of performance. (3) Can perform without help (2)  Can perform with little help (1) Can perform with much help (0) Cannot perform  0__ 12. Standing, Picking Up a Pencil from the Floor Instructions: Begin with the subject standing. Instruct the subject to pick up a pencil fro the floor without support. Assist if necessary. Evaluate the subject's performance on the amount of help required. Do not consider the quality of performance. (3) Can perform without help (2) Can perform with little help (1) Can perform with much help (0) Cannot perform  Changing Posture SUBTOTAL __16___   TOTAL __25___         [facility] Adult PT Treatment/Exercise - 03/10/16 0952      Transfers   Transfers Sit to Stand;Stand to Sit;Stand Pivot Transfers   Sit to Stand 5: Supervision;With upper extremity assist;From bed;From chair/3-in-1;4: Min assist   Sit to Stand Details Verbal cues for sequencing;Verbal cues for technique;Verbal cues for precautions/safety   Sit to Stand Details (indicate  cue type and reason) increased assistance needed as pt fatigued. min assist toward end of session   Stand to Sit 5: Supervision;With upper extremity assist;To bed;To chair/3-in-1;4: Min assist   Stand to Sit Details (indicate cue type and reason) Verbal cues for sequencing;Verbal cues for technique;Verbal cues for precautions/safety;Verbal cues for safe use of DME/AE   Stand to Sit Details cues to fully align with surface prior to sitting down, increased assistance needed as pt fatigued.   Stand Pivot Transfers 4: Min assist   Stand Pivot Transfer Details (indicate cue type and reason) w/c<>mat table with [facility] with cues on sequencing, technique and safety     Ambulation/Gait   Ambulation/Gait Yes   Ambulation/Gait Assistance 4: Min assist;3: Mod assist;2: Max assist   Ambulation/Gait Assistance Details increased assistance needed as gait progressed, max assist for last 8-10 feet of gait. cues needed through out for upright posture (pt with forward flexed trunk), for increased lateral weight shifitng in stande left>right and for step length with gait. occasional cues/assistance needed for [facility] placement as well.    Ambulation Distance (Feet) 100 Feet  x1   Assistive device Large base quad cane;Other (Comment)  LBQC= large based quad cane in note   Gait Pattern Step-through pattern;Decreased stance time - left;Decreased hip/knee flexion - left;Decreased dorsiflexion - left;Lateral trunk lean to right;Decreased arm swing - left;Decreased weight shift to left;Narrow base of support;Left circumduction;Left genu recurvatum;Ataxic   Ambulation Surface Level;Indoor             PT Short Term Goals - 03/10/16 1630      PT SHORT TERM GOAL #1   Title Pt will initiate HEP in order to indicate improved functional mobility and decreased fall risk.  (Target Date: 03/17/16)   Baseline Met 9/27.   Status Achieved     PT SHORT TERM GOAL #2   Title Pt will perform stand pivot transfer w/ LRAD at S level  in order to increase independence at home.     Baseline 03/10/16: pt continues to need up to min assist at times for balance.   Status Not Met     PT SHORT TERM GOAL #3   Title Pt will perform bed mobility at S level in order to indicate improved functional independence.     Baseline Met 9/27.   Time 4   Period Weeks   Status Achieved     PT SHORT TERM GOAL #4   Title Pt will ambulate w/ LRAD x 150' at min A level in order to indicate improved household  ambulation.     Baseline 03/10/16: pt unable to meet this distance and continues to need varied assistance, min to max, depending on fatigue level.   Status Not Met     PT SHORT TERM GOAL #5   Title Pt will perform PASS and improve score 3 points in order to indicate improved functional mobility.     Baseline 03/10/16: no change in score from 1st assessment, continues to be 25.   Status Not Met           PT Long Term Goals - 02/18/16 1149      PT LONG TERM GOAL #1   Title Pt/spouse will be independent with HEP in order to indicate improved functional mobility and decreased fall risk.  (Target Date: 04/14/16)   Time 8   Period Weeks   Status New     PT LONG TERM GOAL #2   Title Pt will perform stand pivot transfers w/ LRAD at mod I level in order to indicate improved functional independence.    Time 8   Period Weeks   Status New     PT LONG TERM GOAL #3   Title Pt will perform bed mobility at mod I level in order to indicate improved functional mobility.     Time 8   Period Weeks   Status New     PT LONG TERM GOAL #4   Title Pt will ambulate x 150' w/ LRAD at mod I level over indoor surfaces to indicate increased independence with household ambulation.     Time 8   Period Weeks   Status New     PT LONG TERM GOAL #5   Title Pt will ambulate over paved outdoor surfaces w/ LRAD x 200' at S level in order to indicate initiation of community ambulation.    Time 8   Period Weeks   Status New           Plan -  03/10/16 0936    Clinical Impression Statement Pt did not meet remaining STGs checked today, has made progress toward most of them (no change in PASS score from ~1-2 weeks ago). Pt needed increased assistance with gait today vs previous session, up to max assist toward the end of the gait trial. Pt continues to vary in abilities and assistance needed due to fatigue. Pt should benefit from continued PT to progress toward unmet goals.                        Rehab Potential Excellent   Clinical Impairments Affecting Rehab Potential Pt very motivated and able to attend therapy 3x/wk   PT Frequency 3x / week  then 2x/week for 4 weeks   PT Duration 4 weeks  then 2x/week for 4 weeks   PT Treatment/Interventions ADLs/Self Care Home Management;Canalith Repostioning;Electrical Stimulation;DME Instruction;Stair training;Gait training;Functional mobility training;Therapeutic activities;Therapeutic exercise;Balance training;Neuromuscular re-education;Patient/family education;Orthotic Fit/Training;Passive range of motion;Energy conservation;Taping;Vestibular;Visual/perceptual remediation/compensation   PT Next Visit Plan Continue to focus on grading/control of lateral weight shift during stand pivot transfers, gait training with [facility].    Consulted and Agree with Plan of Care Patient;Family member/caregiver   Family Member Consulted Husband Shelly Mcdaniel      Patient will benefit from skilled therapeutic intervention in order to improve the following deficits and impairments:  Abnormal gait, Decreased activity tolerance, Decreased balance, Decreased coordination, Decreased endurance, Decreased knowledge of use of DME, Decreased mobility, Decreased strength, Dizziness, Impaired perceived functional ability, Impaired flexibility, Impaired tone,  Impaired UE functional use, Impaired vision/preception, Improper body mechanics, Postural dysfunction  Visit Diagnosis: Hemiplegia and hemiparesis following cerebral infarction  affecting left non-dominant side (HCC)  Unsteadiness on feet  Other abnormalities of gait and mobility  Abnormal posture     Problem List Patient Active Problem List   Diagnosis Date Noted  . 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 TIA (transient ischemic attack) 11/07/2014  . Hypertension 11/07/2014  . DM II (diabetes mellitus, type II), controlled ([place]) 11/07/2014    Willow Ora, PTA, [facility] [address], [place], [place] [address] [phone] 03/11/16, 10:24 AM   Name: Shelly Mcdaniel MRN: [id number] Date of Birth: Jul 27, 1955

## 2016-03-10 NOTE — Therapy (Signed)
[facility] [address] [place], [place], [address] Phone: [phone]   Fax:  [phone]  Occupational Therapy Treatment  Patient Details  Name: Shelly Mcdaniel MRN: [id number] Date of Birth: [birth year] Referring Provider: Dr. Charisse March  Encounter Date: 03/10/2016      OT End of Session - 03/10/16 1143    Visit Number 8   Number of Visits 24   Date for OT Re-Evaluation 04/25/16   Authorization Type BC/BS   OT Start Time 0845   OT Stop Time 0930   OT Time Calculation (min) 45 min   Activity Tolerance Patient tolerated treatment well      Past Medical History:  Diagnosis Date  . Diabetes mellitus without complication ([place])   . Hyperlipidemia   . Hypertension     Past Surgical History:  Procedure Laterality Date  . BACK SURGERY    . BREAST SURGERY    . COLONOSCOPY WITH PROPOFOL N/A 12/24/2013   Procedure: COLONOSCOPY WITH PROPOFOL;  Surgeon: [name], MD;  Location: [facility];  Service: Endoscopy;  Laterality: N/A;    There were no vitals filed for this visit.      Subjective Assessment - 03/10/16 0853    Subjective  My arm in now tired   Patient is accompained by: Family member   Limitations vestibular deficits (central and peripheral deficits), fall risk, Lt AFO, sh. subluxation   Patient Stated Goals I want my hand to come back   Currently in Pain? Yes   Pain Score 1    Pain Location Shoulder   Pain Orientation Left   Pain Descriptors / Indicators Aching;Sharp   Pain Onset More than a month ago   Pain Frequency Intermittent   Aggravating Factors  with movement, lying on Lt side   Pain Relieving Factors rest, repositioning                      OT Treatments/Exercises (OP) - 03/10/16 1134      ADLs   Toileting Practiced stand pivot transfer from bed to/from [facility] with min assist for safety and to prevent falls.    ADL Comments Practiced bed mobility: sit to/from  supine with min assist for leg management and for coming fully upright from supine     Neurological Re-education Exercises   Other Exercises 1 Supine: P/ROM LUE to midrange and slightly beyond as tolerated, followed by place and hold ex's, then progressed to AA/ROM with min assist for sh. flexion and to maintain elbow extension. Pt then performed chest press with dowel rod requiring min assist for elbow ext and hand placement with dowel rod.    Other Exercises 2 Seated: closed chain AA/ROM with tilted stool with min faciliation and assist to maintain hand placement. Pt required cues to prevent compensation at trunk but then could carry over with repetition without cues. Pt then performed body on arm movements with light weight bearing over UE during trunk rotation and contralateral reaching with RUE.                   OT Short Term Goals - 03/10/16 1144      OT SHORT TERM GOAL #1   Title Pt/family independent with initial HEP for LUE (All STG's due 03/26/16)    Status On-going     OT SHORT TERM GOAL #2   Title Pt/family to verbalize understanding with proper positioning of LUE during activity and in bed to prevent  pain including use of sling prn   Status Achieved     OT SHORT TERM GOAL #3   Title Pt to perform UE dressing with min assist using hemi techniques   Status On-going     OT SHORT TERM GOAL #4   Title Pt to perform functional stand pivot transfer for toileting with no more than mod assist    Status Achieved  Currently min assist     OT SHORT TERM GOAL #5   Title Pt to be mod assist with LE dressing using hemi techniques and A/E PRN   Status On-going           OT Long Term Goals - 02/26/16 1126      OT LONG TERM GOAL #1   Title Independent with updated HEP prn (all LTG's due 04/25/16)    Status On-going     OT LONG TERM GOAL #2   Title Pt to demo active low range sh. flexion to 25 degrees in prep for reaching and use of arm as stabilizer   Status On-going      OT LONG TERM GOAL #3   Title Pt to demo 50% gross finger flexion/extension to grasp/release cylindrical objects   Status On-going     OT LONG TERM GOAL #4   Title Pt to be supervision level with all BADLS   Status On-going     OT LONG TERM GOAL #5   Title Vision goal TBD    Status New               Plan - 03/10/16 1145    Clinical Impression Statement Pt met STG #4 today. Pt demo activation of LUE during closed chain activities, and some at shoulder with AA/ROM.    Rehab Potential Good   OT Frequency 3x / week   OT Duration 8 weeks   OT Treatment/Interventions Self-care/ADL training;Moist Heat;DME and/or AE instruction;Splinting;Patient/family education;Therapeutic exercises;Compression bandaging;Therapeutic activities;Neuromuscular education;Functional Mobility Training;Passive range of motion;Cognitive remediation/compensation;Electrical Stimulation;Manual Therapy;Dry needling;Visual/perceptual remediation/compensation   Plan Practice dressing with hemi-techniques (Pt to bring in shirt and pants in prep for colder months, she currently wears mostly dresses) and assess STG's #3 and #5. If time, initiate initial HEP       Patient will benefit from skilled therapeutic intervention in order to improve the following deficits and impairments:  Decreased activity tolerance, Decreased balance, Decreased cognition, Decreased knowledge of use of DME, Decreased coordination, Decreased endurance, Decreased range of motion, Decreased safety awareness, Decreased skin integrity, Decreased strength, Impaired perceived functional ability, Increased edema, Impaired sensation, Impaired tone, Impaired UE functional use, Impaired vision/preception, Pain, Improper body mechanics  Visit Diagnosis: Hemiplegia and hemiparesis following cerebral infarction affecting left non-dominant side (HCC)  Pain in left shoulder    Problem List Patient Active Problem List   Diagnosis Date Noted  .  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 TIA (transient ischemic attack) 11/07/2014  . Hypertension 11/07/2014  . DM II (diabetes mellitus, type II), controlled ([place]) 11/07/2014    Carey Bullocks, OTR/L 03/10/2016, 11:51 AM  [facility] [address] [place], [place], [address] Phone: [phone]   Fax:  [phone]  Name: Shelly Mcdaniel MRN: [id number] Date of Birth: 1956-04-07

## 2016-03-14 ENCOUNTER — Ambulatory Visit: Payer: BC Managed Care – PPO | Attending: Physical Medicine and Rehabilitation | Admitting: Rehabilitation

## 2016-03-14 ENCOUNTER — Ambulatory Visit: Payer: BC Managed Care – PPO | Admitting: Occupational Therapy

## 2016-03-14 ENCOUNTER — Encounter: Payer: Self-pay | Admitting: Rehabilitation

## 2016-03-14 DIAGNOSIS — R2681 Unsteadiness on feet: Secondary | ICD-10-CM

## 2016-03-14 DIAGNOSIS — M25512 Pain in left shoulder: Secondary | ICD-10-CM | POA: Insufficient documentation

## 2016-03-14 DIAGNOSIS — R293 Abnormal posture: Secondary | ICD-10-CM

## 2016-03-14 DIAGNOSIS — I69354 Hemiplegia and hemiparesis following cerebral infarction affecting left non-dominant side: Secondary | ICD-10-CM

## 2016-03-14 DIAGNOSIS — R42 Dizziness and giddiness: Secondary | ICD-10-CM | POA: Diagnosis present

## 2016-03-14 DIAGNOSIS — H8111 Benign paroxysmal vertigo, right ear: Secondary | ICD-10-CM | POA: Insufficient documentation

## 2016-03-14 DIAGNOSIS — R2689 Other abnormalities of gait and mobility: Secondary | ICD-10-CM

## 2016-03-14 DIAGNOSIS — R471 Dysarthria and anarthria: Secondary | ICD-10-CM | POA: Insufficient documentation

## 2016-03-14 DIAGNOSIS — R278 Other lack of coordination: Secondary | ICD-10-CM | POA: Diagnosis present

## 2016-03-14 DIAGNOSIS — M79602 Pain in left arm: Secondary | ICD-10-CM | POA: Diagnosis present

## 2016-03-14 DIAGNOSIS — R41842 Visuospatial deficit: Secondary | ICD-10-CM | POA: Insufficient documentation

## 2016-03-14 NOTE — Therapy (Signed)
[facility] [address] [place], [place], [address] Phone: [phone]   Fax:  [phone]  Occupational Therapy Treatment  Patient Details  Name: Shelly Mcdaniel MRN: [id number] Date of Birth: 08/03/1955 Referring Provider: Dr. Charisse March  Encounter Date: 03/14/2016      OT End of Session - 03/14/16 1525    Visit Number 9   Number of Visits 24   Date for OT Re-Evaluation 04/25/16   Authorization Type BC/BS   OT Start Time 1104   OT Stop Time 1145   OT Time Calculation (min) 41 min   Activity Tolerance Patient tolerated treatment well   Behavior During Therapy [facility] for tasks assessed/performed      Past Medical History:  Diagnosis Date  . Diabetes mellitus without complication ([place])   . Hyperlipidemia   . Hypertension     Past Surgical History:  Procedure Laterality Date  . BACK SURGERY    . BREAST SURGERY    . COLONOSCOPY WITH PROPOFOL N/A 12/24/2013   Procedure: COLONOSCOPY WITH PROPOFOL;  Surgeon: [name], MD;  Location: [facility];  Service: Endoscopy;  Laterality: N/A;    There were no vitals filed for this visit.      Subjective Assessment - 03/14/16 1059    Subjective  Pt reports vertigo and started doing BBQ rolls.  Pt reports that she is very tired from PT prior to OT session today and is having incr difficulty with transfer due to fatigue.   Patient is accompained by: Family member   Limitations vestibular deficits (central and peripheral deficits), fall risk, Lt AFO, sh. subluxation   Patient Stated Goals I want my hand to come back   Currently in Pain? No/denies  session within pain-free range   Pain Onset More than a month ago   Aggravating Factors  improper positioning   Pain Relieving Factors repositioning        Dressing:  Pt was able to don/doff shirt with min cues after instruction of hemi-techniques.  Pt was able to unfasten pants but due to fatigue, was not able to  stand to practice donning/doffing pants today.  Practice donning/doffing shoes/socks.  Pt instructed in use of sock aide based on pt questions and tried sock aide but too much difficulty putting sock on aide.  Pt then was able to don/doff socks without sock aide mod I and don/doff R shoe.  Pt needed min-mod A to don L shoe/brace.  In supine, PROM to fingers/wrist in ext and shoulder flex and ER within pt tolerance.  Followed by [facility] ER and elbow ext.   Then in sitting, light wt. Bearing with hand on mat with mod cueing, min facilitation.                        OT Education - 03/14/16 1523    Education Details UB hemi-dressing technique; Reviewed proper positioning of LUE in bed   Person(s) Educated Patient;Spouse   Methods Explanation;Demonstration;Verbal cues   Comprehension Verbalized understanding;Returned demonstration;Verbal cues required  min cueing to use gravity to assist and pull shirt up over elbow          OT Short Term Goals - 03/14/16 1529      OT SHORT TERM GOAL #1   Title Pt/family independent with initial HEP for LUE (All STG's due 03/26/16)    Status On-going     OT SHORT TERM GOAL #2   Title Pt/family to verbalize  understanding with proper positioning of LUE during activity and in bed to prevent pain including use of sling prn   Status Achieved     OT SHORT TERM GOAL #3   Title Pt to perform UE dressing with min assist using hemi techniques   Status Achieved  03/14/16  min cueing     OT SHORT TERM GOAL #4   Title Pt to perform functional stand pivot transfer for toileting with no more than mod assist    Status Achieved  Currently min assist     OT SHORT TERM GOAL #5   Title Pt to be mod assist with LE dressing using hemi techniques and A/E PRN   Status On-going           OT Long Term Goals - 02/26/16 1126      OT LONG TERM GOAL #1   Title Independent with updated HEP prn (all LTG's due 04/25/16)    Status On-going     OT LONG  TERM GOAL #2   Title Pt to demo active low range sh. flexion to 25 degrees in prep for reaching and use of arm as stabilizer   Status On-going     OT LONG TERM GOAL #3   Title Pt to demo 50% gross finger flexion/extension to grasp/release cylindrical objects   Status On-going     OT LONG TERM GOAL #4   Title Pt to be supervision level with all BADLS   Status On-going     OT LONG TERM GOAL #5   Title Vision goal TBD    Status New               Plan - 03/14/16 1529    Clinical Impression Statement Pt met STG #3, unable to practice donning/doffing pants due to fatigue after PT session today.  Pt is progressing towards goals.   Rehab Potential Good   OT Frequency 3x / week   OT Duration 8 weeks   OT Treatment/Interventions Self-care/ADL training;Moist Heat;DME and/or AE instruction;Splinting;Patient/family education;Therapeutic exercises;Compression bandaging;Therapeutic activities;Neuromuscular education;Functional Mobility Training;Passive range of motion;Cognitive remediation/compensation;Electrical Stimulation;Manual Therapy;Dry needling;Visual/perceptual remediation/compensation   Plan Practice LB dressing and assess STGs prn, initiate initial HEP   Consulted and Agree with Plan of Care Family member/caregiver;Patient   Family Member Consulted Husband - [name]      Patient will benefit from skilled therapeutic intervention in order to improve the following deficits and impairments:  Decreased activity tolerance, Decreased balance, Decreased cognition, Decreased knowledge of use of DME, Decreased coordination, Decreased endurance, Decreased range of motion, Decreased safety awareness, Decreased skin integrity, Decreased strength, Impaired perceived functional ability, Increased edema, Impaired sensation, Impaired tone, Impaired UE functional use, Impaired vision/preception, Pain, Improper body mechanics  Visit Diagnosis: Hemiplegia and hemiparesis following cerebral infarction  affecting left non-dominant side (HCC)  Acute pain of left shoulder  Abnormal posture  Other abnormalities of gait and mobility    Problem List Patient Active Problem List   Diagnosis Date Noted  . 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 TIA (transient ischemic attack) 11/07/2014  . Hypertension 11/07/2014  . DM II (diabetes mellitus, type II), controlled ([place]) 11/07/2014    [facility] 03/14/2016, 4:53 PM  [facility] [address] [place], [place], [address] Phone: [phone]   Fax:  [phone]  Name: OREATHA FABRY MRN: [id number] Date of Birth: 02-07-1956   Vianne Bulls, OTR/L [facility] [address] [place], [place]  [id number] [phone] phone [phone] 03/14/16 4:53  PM

## 2016-03-14 NOTE — Therapy (Signed)
[facility] [address] [place], [place], [address] Phone: [phone]   Fax:  [phone]  Physical Therapy Treatment  Patient Details  Name: Shelly Mcdaniel MRN: [id number] Date of Birth: 05/26/1956 Referring Provider: Juanda Bond, MD  Encounter Date: 03/14/2016      PT End of Session - 03/14/16 1315    Visit Number 11   Number of Visits 21   Date for PT Re-Evaluation 04/18/16   Authorization Type BCBS 0 visit limit, 0 auth   PT Start Time 1020  pt late to appt   PT Stop Time 1100   PT Time Calculation (min) 40 min   Equipment Utilized During Treatment Gait belt   Activity Tolerance Patient tolerated treatment well;Patient limited by fatigue;Other (comment)  Activity tolerance improving, but gait distance continues to be limited by fatigue   Behavior During Therapy [facility] for tasks assessed/performed      Past Medical History:  Diagnosis Date  . Diabetes mellitus without complication ([place])   . Hyperlipidemia   . Hypertension     Past Surgical History:  Procedure Laterality Date  . BACK SURGERY    . BREAST SURGERY    . COLONOSCOPY WITH PROPOFOL N/A 12/24/2013   Procedure: COLONOSCOPY WITH PROPOFOL;  Surgeon: [name], MD;  Location: [facility];  Service: Endoscopy;  Laterality: N/A;    There were no vitals filed for this visit.      Subjective Assessment - 03/14/16 1314    Subjective "I'm so dizzy when I roll in the bed, especially to the R."    Patient is accompained by: Family member   Limitations House hold activities;Walking   Currently in Pain? No/denies                         [facility] Adult PT Treatment/Exercise - 03/14/16 1100      Transfers   Transfers Sit to Stand;Stand to [employer]   Sit to Stand 4: Min guard;4: Min assist;With upper extremity assist   Sit to Stand Details Verbal cues for sequencing;Verbal cues for technique;Verbal cues for  precautions/safety;Manual facilitation for weight shifting;Manual facilitation for weight bearing   Stand to Sit 4: Min guard   Stand to Sit Details (indicate cue type and reason) Verbal cues for sequencing;Verbal cues for technique;Verbal cues for precautions/safety;Verbal cues for safe use of DME/AE;Manual facilitation for weight bearing;Manual facilitation for weight shifting   Stand Pivot Transfers 4: Min assist   Stand Pivot Transfer Details (indicate cue type and reason) w/c<>mat without AD at min A with cues for foot placement, scooting, and adequate weight shift.  Continues to require facilitaiton and cues for improved L lateral weight shift.      Ambulation/Gait   Ambulation/Gait Yes   Ambulation/Gait Assistance 4: Min assist   Ambulation/Gait Assistance Details Performed gait x 115' during session with [facility] with continued cues for posture, improved L hip protraction during stance, and decreased compensation with trunk extension to allow L foot to clear.  Facilitaiton at axilla and trunk for posture and improved L forward lateral weight shift as well as at pelvis for improved hip protraction.    Ambulation Distance (Feet) 115 Feet   Assistive device Large base quad cane   Gait Pattern Step-through pattern;Decreased stance time - left;Decreased hip/knee flexion - left;Decreased dorsiflexion - left;Lateral trunk lean to right;Decreased arm swing - left;Decreased weight shift to left;Narrow base of support;Left circumduction;Left genu recurvatum;Ataxic  Ambulation Surface Level;Indoor     Neuro Re-ed    Neuro Re-ed Details  Performed tall kneeling tasks for improved WB through LLE, increased L lateral weight shift and improved postural control; transitioning from squatted position (sitting on heels) to upright position without use of UE.  Requires min A, with cues for increased hip extension.  Abduction/adduction of RLE in order to increase stabilization in LLE and forced use.                   PT Education - 03/14/16 1315    Education provided Yes   Education Details BPPV treatment   Person(s) Educated Patient;Spouse   Methods Explanation;Demonstration   Comprehension Verbalized understanding          PT Short Term Goals - 03/10/16 1630      PT SHORT TERM GOAL #1   Title Pt will initiate HEP in order to indicate improved functional mobility and decreased fall risk.  (Target Date: 03/17/16)   Baseline Met 9/27.   Status Achieved     PT SHORT TERM GOAL #2   Title Pt will perform stand pivot transfer w/ LRAD at S level in order to increase independence at home.     Baseline 03/10/16: pt continues to need up to min assist at times for balance.   Status Not Met     PT SHORT TERM GOAL #3   Title Pt will perform bed mobility at S level in order to indicate improved functional independence.     Baseline Met 9/27.   Time 4   Period Weeks   Status Achieved     PT SHORT TERM GOAL #4   Title Pt will ambulate w/ LRAD x 150' at min A level in order to indicate improved household ambulation.     Baseline 03/10/16: pt unable to meet this distance and continues to need varied assistance, min to max, depending on fatigue level.   Status Not Met     PT SHORT TERM GOAL #5   Title Pt will perform PASS and improve score 3 points in order to indicate improved functional mobility.     Baseline 03/10/16: no change in score from 1st assessment, continues to be 25.   Status Not Met           PT Long Term Goals - 02/18/16 1149      PT LONG TERM GOAL #1   Title Pt/spouse will be independent with HEP in order to indicate improved functional mobility and decreased fall risk.  (Target Date: 04/14/16)   Time 8   Period Weeks   Status New     PT LONG TERM GOAL #2   Title Pt will perform stand pivot transfers w/ LRAD at mod I level in order to indicate improved functional independence.    Time 8   Period Weeks   Status New     PT LONG TERM GOAL #3   Title Pt  will perform bed mobility at mod I level in order to indicate improved functional mobility.     Time 8   Period Weeks   Status New     PT LONG TERM GOAL #4   Title Pt will ambulate x 150' w/ LRAD at mod I level over indoor surfaces to indicate increased independence with household ambulation.     Time 8   Period Weeks   Status New     PT LONG TERM GOAL #5   Title Pt will ambulate over  paved outdoor surfaces w/ LRAD x 200' at S level in order to indicate initiation of community ambulation.    Time 8   Period Weeks   Status New               Plan - 03/14/16 1913    Clinical Impression Statement Skilled session focused on addressing dizziness.  Note pt with R horizontal canal BPPV and therefore treated during session.  Also worked on [employer] for LLE and postural control, ending with gait training with [facility].     Rehab Potential Excellent   Clinical Impairments Affecting Rehab Potential Pt very motivated and able to attend therapy 3x/wk   PT Frequency 3x / week  then 2x/week for 4 weeks   PT Duration 4 weeks  then 2x/week for 4 weeks   PT Treatment/Interventions ADLs/Self Care Home Management;Canalith Repostioning;Electrical Stimulation;DME Instruction;Stair training;Gait training;Functional mobility training;Therapeutic activities;Therapeutic exercise;Balance training;Neuromuscular re-education;Patient/family education;Orthotic Fit/Training;Passive range of motion;Energy conservation;Taping;Vestibular;Visual/perceptual remediation/compensation   PT Next Visit Plan Continue to focus on grading/control of lateral weight shift during standing and stand pivot transfers, gait training with [facility].    Consulted and Agree with Plan of Care Patient;Family member/caregiver   Family Member Consulted Husband [name]      Patient will benefit from skilled therapeutic intervention in order to improve the following deficits and impairments:  Abnormal gait, Decreased activity tolerance, Decreased  balance, Decreased coordination, Decreased endurance, Decreased knowledge of use of DME, Decreased mobility, Decreased strength, Dizziness, Impaired perceived functional ability, Impaired flexibility, Impaired tone, Impaired UE functional use, Impaired vision/preception, Improper body mechanics, Postural dysfunction  Visit Diagnosis: Hemiplegia and hemiparesis following cerebral infarction affecting left non-dominant side (HCC)  Unsteadiness on feet  Other abnormalities of gait and mobility  Abnormal posture     Problem List Patient Active Problem List   Diagnosis Date Noted  . 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 TIA (transient ischemic attack) 11/07/2014  . Hypertension 11/07/2014  . DM II (diabetes mellitus, type II), controlled ([place]) 11/07/2014   Cameron Sprang, PT, MPT [facility] [address] [place], [place], [address] Phone: [phone]   Fax:  [phone] 03/14/16, 7:24 PM  Name: Shelly Mcdaniel MRN: [id number] Date of Birth: 1956/02/29

## 2016-03-15 ENCOUNTER — Ambulatory Visit: Payer: BC Managed Care – PPO | Admitting: Occupational Therapy

## 2016-03-15 ENCOUNTER — Ambulatory Visit: Payer: BC Managed Care – PPO | Admitting: Physical Therapy

## 2016-03-15 ENCOUNTER — Encounter: Payer: Self-pay | Admitting: Occupational Therapy

## 2016-03-15 DIAGNOSIS — R2681 Unsteadiness on feet: Secondary | ICD-10-CM

## 2016-03-15 DIAGNOSIS — I69354 Hemiplegia and hemiparesis following cerebral infarction affecting left non-dominant side: Secondary | ICD-10-CM

## 2016-03-15 DIAGNOSIS — R278 Other lack of coordination: Secondary | ICD-10-CM

## 2016-03-15 DIAGNOSIS — R42 Dizziness and giddiness: Secondary | ICD-10-CM

## 2016-03-15 DIAGNOSIS — R2689 Other abnormalities of gait and mobility: Secondary | ICD-10-CM

## 2016-03-15 DIAGNOSIS — M25512 Pain in left shoulder: Secondary | ICD-10-CM

## 2016-03-15 DIAGNOSIS — R41842 Visuospatial deficit: Secondary | ICD-10-CM

## 2016-03-15 NOTE — Therapy (Signed)
[facility] [address] [place], [place], [address] Phone: [phone]   Fax:  [phone]  Physical Therapy Treatment  Patient Details  Name: Shelly Mcdaniel MRN: [id number] Date of Birth: 1956/06/01 Referring Provider: Juanda Bond, MD  Encounter Date: 03/15/2016      PT End of Session - 03/15/16 1053    Visit Number 12   Number of Visits 21   Date for PT Re-Evaluation 04/18/16   Authorization Type BCBS 0 visit limit, 0 auth   PT Start Time [phone]   PT Stop Time 0930   PT Time Calculation (min) 38 min   Equipment Utilized During Treatment Gait belt   Activity Tolerance Patient tolerated treatment well;Patient limited by fatigue;Other (comment)  gait distance continues to be limited by fatigue and dysequilibrium   Behavior During Therapy [facility] for tasks assessed/performed      Past Medical History:  Diagnosis Date  . Diabetes mellitus without complication ([place])   . Hyperlipidemia   . Hypertension     Past Surgical History:  Procedure Laterality Date  . BACK SURGERY    . BREAST SURGERY    . COLONOSCOPY WITH PROPOFOL N/A 12/24/2013   Procedure: COLONOSCOPY WITH PROPOFOL;  Surgeon: [name], MD;  Location: [facility];  Service: Endoscopy;  Laterality: N/A;    There were no vitals filed for this visit.      Subjective Assessment - 03/15/16 0855    Subjective In reference to BPPV, pt states, "I think you fixed me yesterday. I feel so much better." Husband modified bed; height is now 29".   Patient is accompained by: Family member  husband, [name]   Limitations House hold activities;Walking   Currently in Pain? Yes   Pain Score 3    Pain Location Hip   Pain Orientation Left   Pain Descriptors / Indicators Aching   Pain Type Chronic pain   Pain Onset More than a month ago   Pain Frequency Intermittent   Aggravating Factors  after tall kneeling   Pain Relieving Factors Tylenol + ibuprofen   Multiple Pain Sites No                         [facility] Adult PT Treatment/Exercise - 03/15/16 0001      Bed Mobility   Bed Mobility Supine to Sit;Sit to Supine   Supine to Sit 6: Modified independent (Device/Increase time);3: Mod assist   Supine to Sit Details (indicate cue type and reason) On 29" bed (to simulatee home environment), performed supine > sit on L with mod A for L shoulder protection, assist to transition from L sidelying > sit.  Subsequent trial to R side (which pt performs at home) with mod I   Sit to Supine 6: Modified independent (Device/Increase time);4: Min assist   Sit to Supine - Details (indicate cue type and reason) On 29" bed (to simulate home environment), pt performed sit > supine to L side (for increased LUE WB, to promote active shortening of trunk on L) with min A for LUE management/joint protection and to control descent into L sidelying. Second trial performed to R side (as pt performs at home) with mod A.     Transfers   Transfers Sit to Stand;Stand to Sit   Sit to Stand 4: Min assist   Sit to Stand Details (indicate cue type and reason) Cueing for L foot placement; pt with effective between-session carryover of hand  placement. Using [facility]   Stand to Sit 4: Min guard   Stand to Sit Details for stability/balance; using [facility]     Ambulation/Gait   Ambulation/Gait Yes   Ambulation/Gait Assistance 4: Min assist   Ambulation/Gait Assistance Details Gait x80' then x35' with LBQC, L AFO, and L GivMorh sling with min to mod A (mostly to control anterior and L lateral weight shift during RLE advancement). Cueing addressed LLE circumduction, L stance stability, postural control during L stance, and narrow BOS. Tactile cueing emphasized full lateral weight shift to L and postural alignment (as pt tends to have posterior preference).    Ambulation Distance (Feet) 115 Feet   Assistive device Large base quad cane   Gait Pattern Step-through pattern;Decreased  stance time - left;Decreased hip/knee flexion - left;Decreased dorsiflexion - left;Lateral trunk lean to right;Decreased arm swing - left;Decreased weight shift to left;Narrow base of support;Left circumduction;Left genu recurvatum;Ataxic   Ambulation Surface Level;Indoor     Therapeutic Activites    Therapeutic Activities --     Neuro Re-ed    Neuro Re-ed Details        Exercises   Exercises Other Exercises   Other Exercises  Supine, hook lying with LLE extended off EOM with foot on 6" box, pt performed prolonged passive stretch of L iliopsoas, rectus femoris 2 x60-sec holds to manage pain in area of L hip flexors.                PT Education - 03/14/16 1315    Education provided Yes   Education Details BPPV treatment   Person(s) Educated Patient;Spouse   Methods Explanation;Demonstration   Comprehension Verbalized understanding          PT Short Term Goals - 03/10/16 1630      PT SHORT TERM GOAL #1   Title Pt will initiate HEP in order to indicate improved functional mobility and decreased fall risk.  (Target Date: 03/17/16)   Baseline Met 9/27.   Status Achieved     PT SHORT TERM GOAL #2   Title Pt will perform stand pivot transfer w/ LRAD at S level in order to increase independence at home.     Baseline 03/10/16: pt continues to need up to min assist at times for balance.   Status Not Met     PT SHORT TERM GOAL #3   Title Pt will perform bed mobility at S level in order to indicate improved functional independence.     Baseline Met 9/27.   Time 4   Period Weeks   Status Achieved     PT SHORT TERM GOAL #4   Title Pt will ambulate w/ LRAD x 150' at min A level in order to indicate improved household ambulation.     Baseline 03/10/16: pt unable to meet this distance and continues to need varied assistance, min to max, depending on fatigue level.   Status Not Met     PT SHORT TERM GOAL #5   Title Pt will perform PASS and improve score 3 points in order to  indicate improved functional mobility.     Baseline 03/10/16: no change in score from 1st assessment, continues to be 25.   Status Not Met           PT Long Term Goals - 02/18/16 1149      PT LONG TERM GOAL #1   Title Pt/spouse will be independent with HEP in order to indicate improved functional mobility and decreased fall risk.  (  Target Date: 04/14/16)   Time 8   Period Weeks   Status New     PT LONG TERM GOAL #2   Title Pt will perform stand pivot transfers w/ LRAD at mod I level in order to indicate improved functional independence.    Time 8   Period Weeks   Status New     PT LONG TERM GOAL #3   Title Pt will perform bed mobility at mod I level in order to indicate improved functional mobility.     Time 8   Period Weeks   Status New     PT LONG TERM GOAL #4   Title Pt will ambulate x 150' w/ LRAD at mod I level over indoor surfaces to indicate increased independence with household ambulation.     Time 8   Period Weeks   Status New     PT LONG TERM GOAL #5   Title Pt will ambulate over paved outdoor surfaces w/ LRAD x 200' at S level in order to indicate initiation of community ambulation.    Time 8   Period Weeks   Status New               Plan - 03/15/16 1054    Clinical Impression Statement Session focused on continued gait training with [facility], transfer and bed mobility training (to ensure pt safety/independence at home, as husband recently modified bed to 29") and on addressing L hip discomfort. Within-session pain decreased from 3/10 to 1/10 with gentle stretching of hip flexors. Pt tolerated interventions well. Gait distance during ginal trial limited by dysequilibrium.    Rehab Potential Excellent   Clinical Impairments Affecting Rehab Potential Pt very motivated and able to attend therapy 3x/wk   PT Frequency 3x / week  then 2x/week for 4 weeks   PT Duration 4 weeks  then 2x/week for 4 weeks   PT Treatment/Interventions ADLs/Self Care Home  Management;Canalith Repostioning;Electrical Stimulation;DME Instruction;Stair training;Gait training;Functional mobility training;Therapeutic activities;Therapeutic exercise;Balance training;Neuromuscular re-education;Patient/family education;Orthotic Fit/Training;Passive range of motion;Energy conservation;Taping;Vestibular;Visual/perceptual remediation/compensation   PT Next Visit Plan Continue to focus on grading/control of lateral weight shift during standing and stand pivot transfers, gait training with [facility].    Consulted and Agree with Plan of Care Patient;Family member/caregiver   Family Member Consulted Husband [name]      Patient will benefit from skilled therapeutic intervention in order to improve the following deficits and impairments:  Abnormal gait, Decreased activity tolerance, Decreased balance, Decreased coordination, Decreased endurance, Decreased knowledge of use of DME, Decreased mobility, Decreased strength, Dizziness, Impaired perceived functional ability, Impaired flexibility, Impaired tone, Impaired UE functional use, Impaired vision/preception, Improper body mechanics, Postural dysfunction  Visit Diagnosis: Hemiplegia and hemiparesis following cerebral infarction affecting left non-dominant side (HCC)  Other abnormalities of gait and mobility  Unsteadiness on feet  Dizziness and giddiness     Problem List Patient Active Problem List   Diagnosis Date Noted  . 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 TIA (transient ischemic attack) 11/07/2014  . Hypertension 11/07/2014  . DM II (diabetes mellitus, type II), controlled ([place]) 11/07/2014   Billie Ruddy, PT, [facility] [address] [place], [place], [address] Phone: [phone]   Fax:  [phone] 03/15/16, 10:57 AM  Name: JONETTE WASSEL MRN: [id number] Date of Birth: 09-01-1955

## 2016-03-15 NOTE — Therapy (Signed)
[facility] [address] [place], [place], [address] Phone: [phone]   Fax:  [phone]  Occupational Therapy Treatment  Patient Details  Name: Shelly Mcdaniel MRN: [id number] Date of Birth: Dec 18, 1955 Referring Provider: Dr. Tobie Lords  Encounter Date: 03/15/2016      OT End of Session - 03/15/16 1137    Visit Number 10   Number of Visits 24   Date for OT Re-Evaluation 04/25/16   Authorization Type BC/BS   OT Start Time 0931   OT Stop Time 1014   OT Time Calculation (min) 43 min   Activity Tolerance Patient tolerated treatment well      Past Medical History:  Diagnosis Date  . Diabetes mellitus without complication (HCC)   . Hyperlipidemia   . Hypertension     Past Surgical History:  Procedure Laterality Date  . BACK SURGERY    . BREAST SURGERY    . COLONOSCOPY WITH PROPOFOL N/A 12/24/2013   Procedure: COLONOSCOPY WITH PROPOFOL;  Surgeon: Charolett Bumpers, MD;  Location: [facility];  Service: Endoscopy;  Laterality: N/A;    There were no vitals filed for this visit.      Subjective Assessment - 03/15/16 0933    Subjective  I feel better today but I am still a little dizzy.    Patient is accompained by: Family member  husband   Limitations vestibular deficits (central and peripheral deficits), fall risk, Lt AFO, sh. subluxation   Patient Stated Goals I want my hand to come back   Currently in Pain? Yes   Pain Score 3    Pain Location Hip   Pain Orientation Left   Pain Descriptors / Indicators Aching   Pain Type Chronic pain   Pain Onset More than a month ago   Aggravating Factors  after tall kneeling "that was really hard on me the other day"   Pain Relieving Factors OTC meds   Multiple Pain Sites No                      OT Treatments/Exercises (OP) - 03/15/16 1131      ADLs   LB Dressing Practiced donning and doffing pants today. Pt reports at home her husband helps her  and she mostly does this in bed.  Practiced standing at sink in order to incorporate sit to stand, static standing, dynamic standing, finding mildline and stand to sit into a functional task. Pt required mod vc's to monitor balance and for impulsivity during activity - needed to cue pt to stop and regain her balance before continuing. Pt only required cues and min a.  Recommend that pt and husband dress in the kitchen at sink as pt's bathroom is small.  Pt able to don/doff both socks and shoes as well as brace with only min vc's.     ADL Comments Demonstrated bed positioning in supine and sidelying on right side. as pt continues to have pain in R shoulder.  Discussed importance of truly supporting scapula as well as shoulder girdle when in bed. Pt and husband verbalized understanding and were able to see positioning.                 OT Education - 03/14/16 1523    Education Details UB hemi-dressing technique; Reviewed proper positioning of LUE in bed   Person(s) Educated Patient;Spouse   Methods Explanation;Demonstration;Verbal cues   Comprehension Verbalized understanding;Returned demonstration;Verbal cues required  min cueing to use  gravity to assist and pull shirt up over elbow          OT Short Term Goals - 03/15/16 1135      OT SHORT TERM GOAL #1   Title Pt/family independent with initial HEP for LUE (All STG's due 03/26/16)    Status On-going     OT SHORT TERM GOAL #2   Title Pt/family to verbalize understanding with proper positioning of LUE during activity and in bed to prevent pain including use of sling prn   Status Achieved     OT SHORT TERM GOAL #3   Title Pt to perform UE dressing with min assist using hemi techniques   Status Achieved  03/14/16  min cueing     OT SHORT TERM GOAL #4   Title Pt to perform functional stand pivot transfer for toileting with no more than mod assist    Status Achieved  Currently min assist     OT SHORT TERM GOAL #5   Title Pt to be  mod assist with LE dressing using hemi techniques and A/E PRN   Status Achieved  min a           OT Long Term Goals - 03/15/16 1136      OT LONG TERM GOAL #1   Title Independent with updated HEP prn (all LTG's due 04/25/16)    Status On-going     OT LONG TERM GOAL #2   Title Pt to demo active low range sh. flexion to 25 degrees in prep for reaching and use of arm as stabilizer   Status On-going     OT LONG TERM GOAL #3   Title Pt to demo 50% gross finger flexion/extension to grasp/release cylindrical objects   Status On-going     OT LONG TERM GOAL #4   Title Pt to be supervision level with all BADLS   Status On-going     OT LONG TERM GOAL #5   Title Vision goal TBD    Status New               Plan - 03/15/16 1136    Clinical Impression Statement Pt progressing toward LTG's.  Pt with improving balance however needs cues for impulsivity and safety as well as min a.    Rehab Potential Good   OT Frequency 3x / week   OT Duration 8 weeks   OT Treatment/Interventions Self-care/ADL training;Moist Heat;DME and/or AE instruction;Splinting;Patient/family education;Therapeutic exercises;Compression bandaging;Therapeutic activities;Neuromuscular education;Functional Mobility Training;Passive range of motion;Cognitive remediation/compensation;Electrical Stimulation;Manual Therapy;Dry needling;Visual/perceptual remediation/compensation   Plan pt has PROM exercises for RUE; add if appropriate.  NMR for LUE, trunk and mobility   Consulted and Agree with Plan of Care Family member/caregiver;Patient   Family Member Consulted Husband - [name]      Patient will benefit from skilled therapeutic intervention in order to improve the following deficits and impairments:  Decreased activity tolerance, Decreased balance, Decreased cognition, Decreased knowledge of use of DME, Decreased coordination, Decreased endurance, Decreased range of motion, Decreased safety awareness, Decreased skin  integrity, Decreased strength, Impaired perceived functional ability, Increased edema, Impaired sensation, Impaired tone, Impaired UE functional use, Impaired vision/preception, Pain, Improper body mechanics  Visit Diagnosis: Hemiplegia and hemiparesis following cerebral infarction affecting left non-dominant side (HCC)  Other abnormalities of gait and mobility  Acute pain of left shoulder  Other lack of coordination  Visuospatial deficit    Problem List Patient Active Problem List   Diagnosis Date Noted  . 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  TIA (transient ischemic attack) 11/07/2014  . Hypertension 11/07/2014  . DM II (diabetes mellitus, type II), controlled (HCC) 11/07/2014    [name] 03/15/2016, 11:42 AM  [facility] [address] [place], [place], [address] Phone: [phone]   Fax:  [phone]  Name: Shelly Mcdaniel MRN: [id number] Date of Birth: 11/15/1955

## 2016-03-17 ENCOUNTER — Encounter: Payer: Self-pay | Admitting: Rehabilitation

## 2016-03-17 ENCOUNTER — Ambulatory Visit: Payer: BC Managed Care – PPO | Admitting: Occupational Therapy

## 2016-03-17 ENCOUNTER — Ambulatory Visit: Payer: BC Managed Care – PPO | Admitting: Rehabilitation

## 2016-03-17 DIAGNOSIS — I69354 Hemiplegia and hemiparesis following cerebral infarction affecting left non-dominant side: Secondary | ICD-10-CM | POA: Diagnosis not present

## 2016-03-17 DIAGNOSIS — R293 Abnormal posture: Secondary | ICD-10-CM

## 2016-03-17 DIAGNOSIS — R2681 Unsteadiness on feet: Secondary | ICD-10-CM

## 2016-03-17 DIAGNOSIS — M25512 Pain in left shoulder: Secondary | ICD-10-CM

## 2016-03-17 DIAGNOSIS — R2689 Other abnormalities of gait and mobility: Secondary | ICD-10-CM

## 2016-03-17 NOTE — Therapy (Signed)
[place] [address] [place], [place], [address] Phone: [phone]   Fax:  [phone]  Physical Therapy Treatment  Patient Details  Name: Shelly Mcdaniel MRN: [id number] Date of Birth: 22-May-1956 Referring Provider: Juanda Bond, MD  Encounter Date: 03/17/2016      PT End of Session - 03/17/16 1312    Visit Number 13   Number of Visits 21   Date for PT Re-Evaluation 04/18/16   Authorization Type [employer] 0 visit limit, 0 auth   PT Start Time [phone]  pt late from OT   PT Stop Time 1015   PT Time Calculation (min) 42 min   Equipment Utilized During Treatment Gait belt   Activity Tolerance Patient tolerated treatment well;Patient limited by fatigue  gait distance continues to be limited by fatigue and dysequilibrium   Behavior During Therapy [facility] for tasks assessed/performed      Past Medical History:  Diagnosis Date  . Diabetes mellitus without complication ([place])   . Hyperlipidemia   . Hypertension     Past Surgical History:  Procedure Laterality Date  . BACK SURGERY    . BREAST SURGERY    . COLONOSCOPY WITH PROPOFOL N/A 12/24/2013   Procedure: COLONOSCOPY WITH PROPOFOL;  Surgeon: [name], MD;  Location: [facility];  Service: Endoscopy;  Laterality: N/A;    There were no vitals filed for this visit.      Subjective Assessment - 03/17/16 0935    Subjective "My shoulder is hurting so bad."    Patient is accompained by: Family member   Limitations House hold activities;Walking   Currently in Pain? Yes   Pain Score 5    Pain Location Shoulder   Pain Orientation Left   Pain Descriptors / Indicators Aching   Pain Type Acute pain   Pain Onset Yesterday   Pain Frequency Constant   Aggravating Factors  moving while sleeping   Pain Relieving Factors OTC pain meds   Multiple Pain Sites Yes   Pain Score 3   Pain Location Hip   Pain Orientation Left   Pain Descriptors / Indicators Throbbing    Pain Type Acute pain   Pain Onset In the past 7 days   Pain Frequency Constant   Aggravating Factors  WB   Pain Relieving Factors rest                         [facility] Adult PT Treatment/Exercise - 03/17/16 0935      Transfers   Transfers Sit to Stand;Stand to Sit;Squat Pivot Transfers   Sit to Stand 4: Min guard;4: Min assist   Sit to Stand Details Verbal cues for sequencing;Verbal cues for technique;Verbal cues for precautions/safety;Manual facilitation for weight shifting;Manual facilitation for weight bearing   Stand to Sit 4: Min guard   Stand to Sit Details (indicate cue type and reason) Verbal cues for sequencing;Verbal cues for technique;Verbal cues for precautions/safety;Verbal cues for safe use of DME/AE;Manual facilitation for weight bearing;Manual facilitation for weight shifting   Squat Pivot Transfers 5: Supervision;4: Min guard   Comments Sit<>stand within tasks during session for NMR to focus on midline orientation for equal WB on BLEs, upright posture and decreased use of RUE as this tends to cause R lateral lean and trunk rotation.  Pt would place R hand on head to avoid use during session.       Therapeutic Activites    Therapeutic Activities Other Therapeutic Activities  Other Therapeutic Activities Performed supine L hip flexor stretch with LLE off EOM.  Educated on how to perform at home and how husband should assist.  See pt instruction for details.       Neuro Re-ed    Neuro Re-ed Details  sit<>stand as mentioned above within other NMR tasks.  Performed series of standing tasks with RUE placed on 3" step in order to increase weight shift and WB on LLE.  Cues for increased L hip protraction and to maintain L knee "soft" to avoid hyperextension and locking joint for stability.  While in standing had pt move from L to R to increase adequate weight shift.  Utilized mirror during entire session in order to allow increased visual feedback on posture.  Pt  tends to over correct posture with too much trunk extension and head/neck extension, therefore provided cues to remain in neutral upright posture.  Then had pt transition from standing to/from squatted position while reaching L and R.  Facilitation at L LE for increased weight shift to the L with support for knee to prevent buckle/fall.                  PT Education - 03/17/16 [phone]    Education provided Yes   Education Details education on L hip flexor stretch   Person(s) Educated Patient   Methods Explanation   Comprehension Verbalized understanding          PT Short Term Goals - 03/10/16 1630      PT SHORT TERM GOAL #1   Title Pt will initiate HEP in order to indicate improved functional mobility and decreased fall risk.  (Target Date: 03/17/16)   Baseline Met 9/27.   Status Achieved     PT SHORT TERM GOAL #2   Title Pt will perform stand pivot transfer w/ LRAD at S level in order to increase independence at home.     Baseline 03/10/16: pt continues to need up to min assist at times for balance.   Status Not Met     PT SHORT TERM GOAL #3   Title Pt will perform bed mobility at S level in order to indicate improved functional independence.     Baseline Met 9/27.   Time 4   Period Weeks   Status Achieved     PT SHORT TERM GOAL #4   Title Pt will ambulate w/ LRAD x 150' at min A level in order to indicate improved household ambulation.     Baseline 03/10/16: pt unable to meet this distance and continues to need varied assistance, min to max, depending on fatigue level.   Status Not Met     PT SHORT TERM GOAL #5   Title Pt will perform PASS and improve score 3 points in order to indicate improved functional mobility.     Baseline 03/10/16: no change in score from 1st assessment, continues to be 25.   Status Not Met           PT Long Term Goals - 02/18/16 1149      PT LONG TERM GOAL #1   Title Pt/spouse will be independent with HEP in order to indicate improved  functional mobility and decreased fall risk.  (Target Date: 04/14/16)   Time 8   Period Weeks   Status New     PT LONG TERM GOAL #2   Title Pt will perform stand pivot transfers w/ LRAD at mod I level in order to indicate improved functional  independence.    Time 8   Period Weeks   Status New     PT LONG TERM GOAL #3   Title Pt will perform bed mobility at mod I level in order to indicate improved functional mobility.     Time 8   Period Weeks   Status New     PT LONG TERM GOAL #4   Title Pt will ambulate x 150' w/ LRAD at mod I level over indoor surfaces to indicate increased independence with household ambulation.     Time 8   Period Weeks   Status New     PT LONG TERM GOAL #5   Title Pt will ambulate over paved outdoor surfaces w/ LRAD x 200' at S level in order to indicate initiation of community ambulation.    Time 8   Period Weeks   Status New               Plan - 03/17/16 1314    Clinical Impression Statement Skilled session focused on NMR for LLE with WB and forced use with use of mirror for midline orientation, upright posture and increased L hip protraction.  Pt tolerated well during session with some pain in L hip, which was addressed prior to NMR with hip flexor stretch.     Rehab Potential Excellent   Clinical Impairments Affecting Rehab Potential Pt very motivated and able to attend therapy 3x/wk   PT Frequency 3x / week  then 2x/week for 4 weeks   PT Duration 4 weeks  then 2x/week for 4 weeks   PT Treatment/Interventions ADLs/Self Care Home Management;Canalith Repostioning;Electrical Stimulation;DME Instruction;Stair training;Gait training;Functional mobility training;Therapeutic activities;Therapeutic exercise;Balance training;Neuromuscular re-education;Patient/family education;Orthotic Fit/Training;Passive range of motion;Energy conservation;Taping;Vestibular;Visual/perceptual remediation/compensation   PT Next Visit Plan Continue to focus on  grading/control of lateral weight shift during standing and stand pivot transfers, gait training with [facility].    Consulted and Agree with Plan of Care Patient;Family member/caregiver   Family Member Consulted Husband [name]      Patient will benefit from skilled therapeutic intervention in order to improve the following deficits and impairments:  Abnormal gait, Decreased activity tolerance, Decreased balance, Decreased coordination, Decreased endurance, Decreased knowledge of use of DME, Decreased mobility, Decreased strength, Dizziness, Impaired perceived functional ability, Impaired flexibility, Impaired tone, Impaired UE functional use, Impaired vision/preception, Improper body mechanics, Postural dysfunction  Visit Diagnosis: Hemiplegia and hemiparesis following cerebral infarction affecting left non-dominant side (HCC)  Other abnormalities of gait and mobility  Abnormal posture  Unsteadiness on feet     Problem List Patient Active Problem List   Diagnosis Date Noted  . 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 TIA (transient ischemic attack) 11/07/2014  . Hypertension 11/07/2014  . DM II (diabetes mellitus, type II), controlled ([place]) 11/07/2014    Cameron Sprang, PT, MPT [facility] [address] [place], [place], [address] Phone: [phone]   Fax:  [phone] 03/17/16, 2:22 PM  Name: STARASIA SINKO MRN: [id number] Date of Birth: Mar 10, 1956

## 2016-03-17 NOTE — Patient Instructions (Signed)
Hip Flexor Stretch    Lying on back near edge of bed, bend right leg, foot flat. Hang left leg over edge, relaxed, thigh resting entirely on bed for __2__ minutes. Repeat __2__ times. Do _2-3___ sessions per day. Advanced Exercise: Bend knee back keeping thigh in contact with bed.  [name] if she isn't getting enough stretch by left leg off edge of bed, you can help her bend the knee for more stretch.  [name] I want you to stand beside the bed to prevent her from falling.    http://gt2.exer.us/347   Copyright  VHI. All rights reserved.

## 2016-03-17 NOTE — Therapy (Signed)
[facility] [address] [place], [place], [address] Phone: [phone]   Fax:  [phone]  Occupational Therapy Treatment  Patient Details  Name: Shelly SimmerJody D Mcdaniel MRN: [id number] Date of Birth: Dec 08, 1955 Referring Provider: Dr. Tobie LordsLee Shuping  Encounter Date: 03/17/2016      OT End of Session - 03/17/16 1227    Visit Number 11   Number of Visits 24   Date for OT Re-Evaluation 04/25/16   Authorization Type BC/BS   OT Start Time 0850   OT Stop Time 0935   OT Time Calculation (min) 45 min   Activity Tolerance Patient tolerated treatment well      Past Medical History:  Diagnosis Date  . Diabetes mellitus without complication (HCC)   . Hyperlipidemia   . Hypertension     Past Surgical History:  Procedure Laterality Date  . BACK SURGERY    . BREAST SURGERY    . COLONOSCOPY WITH PROPOFOL N/A 12/24/2013   Procedure: COLONOSCOPY WITH PROPOFOL;  Surgeon: Charolett BumpersMartin K Johnson, MD;  Location: [facility];  Service: Endoscopy;  Laterality: N/A;    There were no vitals filed for this visit.      Subjective Assessment - 03/17/16 1219    Patient is accompained by: Family member   Limitations vestibular deficits (central and peripheral deficits), fall risk, Lt AFO, sh. subluxation   Patient Stated Goals I want my hand to come back   Currently in Pain? Yes   Pain Score 5    Pain Location Shoulder   Pain Orientation Left   Pain Descriptors / Indicators Aching   Pain Type Acute pain   Pain Frequency Constant   Aggravating Factors  movement, moving while sleeping   Pain Relieving Factors OTC pain meds                      OT Treatments/Exercises (OP) - 03/17/16 0001      Neurological Re-education Exercises   Other Exercises 1 Supine: P/ROM, self ROM, and AA/ROM to midrange shoulder flexion as tolerated   Other Exercises 2 Seated: AA/ROM for low range flex and ext with support upper and lower arm and to  prevent synergy patterns/tactile cues   Other Weight-Bearing Exercises 1 Seated: body on arm movements over LUE with trunk rotation to facilitate LUE/tricep activation and sh. alignment. Followed by lateral trunk flexion bilaterally with LUE in wt bearing position      Manual Therapy   Manual Therapy Taping   Manual therapy comments P/ROM for elbow ext and finger extension d/t spasticity   Kinesiotex Inhibit Muscle;Facilitate Muscle  relax middle deltoid, facilitate scap retraction/trunk ext                  OT Short Term Goals - 03/15/16 1135      OT SHORT TERM GOAL #1   Title Pt/family independent with initial HEP for LUE (All STG's due 03/26/16)    Status On-going     OT SHORT TERM GOAL #2   Title Pt/family to verbalize understanding with proper positioning of LUE during activity and in bed to prevent pain including use of sling prn   Status Achieved     OT SHORT TERM GOAL #3   Title Pt to perform UE dressing with min assist using hemi techniques   Status Achieved  03/14/16  min cueing     OT SHORT TERM GOAL #4   Title Pt to perform functional stand pivot transfer for  toileting with no more than mod assist    Status Achieved  Currently min assist     OT SHORT TERM GOAL #5   Title Pt to be mod assist with LE dressing using hemi techniques and A/E PRN   Status Achieved  min a           OT Long Term Goals - 03/15/16 1136      OT LONG TERM GOAL #1   Title Independent with updated HEP prn (all LTG's due 04/25/16)    Status On-going     OT LONG TERM GOAL #2   Title Pt to demo active low range sh. flexion to 25 degrees in prep for reaching and use of arm as stabilizer   Status On-going     OT LONG TERM GOAL #3   Title Pt to demo 50% gross finger flexion/extension to grasp/release cylindrical objects   Status On-going     OT LONG TERM GOAL #4   Title Pt to be supervision level with all BADLS   Status On-going     OT LONG TERM GOAL #5   Title Vision goal  TBD    Status New               Plan - 03/17/16 1228    Clinical Impression Statement Pt remains limited by pain LUE due to spasticity, synergy pattern, guarding. Unable to update HEP until pt able to move out of pain to perform correctly   Rehab Potential Good   OT Frequency 3x / week   OT Duration 8 weeks   OT Treatment/Interventions Self-care/ADL training;Moist Heat;DME and/or AE instruction;Splinting;Patient/family education;Therapeutic exercises;Compression bandaging;Therapeutic activities;Neuromuscular education;Functional Mobility Training;Passive range of motion;Cognitive remediation/compensation;Electrical Stimulation;Manual Therapy;Dry needling;Visual/perceptual remediation/compensation   Plan assess kinesiotape, continue NMR LUE/trunk   Consulted and Agree with Plan of Care Patient;Family member/caregiver   Family Member Consulted Husband - [name]      Patient will benefit from skilled therapeutic intervention in order to improve the following deficits and impairments:  Decreased activity tolerance, Decreased balance, Decreased cognition, Decreased knowledge of use of DME, Decreased coordination, Decreased endurance, Decreased range of motion, Decreased safety awareness, Decreased skin integrity, Decreased strength, Impaired perceived functional ability, Increased edema, Impaired sensation, Impaired tone, Impaired UE functional use, Impaired vision/preception, Pain, Improper body mechanics  Visit Diagnosis: Hemiplegia and hemiparesis following cerebral infarction affecting left non-dominant side (HCC)  Acute pain of left shoulder    Problem List Patient Active Problem List   Diagnosis Date Noted  . 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 TIA (transient ischemic attack) 11/07/2014  . Hypertension 11/07/2014  . DM II (diabetes mellitus, type II), controlled (HCC) 11/07/2014    Kelli Churn, OTR/L 03/17/2016, 12:30 PM  [facility] [address] [place], [place], [address] Phone: [phone]   Fax:  [phone]  Name: Shelly Mcdaniel MRN: [id number] Date of Birth: 29-Jan-1956

## 2016-03-22 ENCOUNTER — Ambulatory Visit: Payer: BC Managed Care – PPO | Admitting: Physical Therapy

## 2016-03-22 ENCOUNTER — Ambulatory Visit: Payer: BC Managed Care – PPO | Admitting: Occupational Therapy

## 2016-03-22 DIAGNOSIS — I69354 Hemiplegia and hemiparesis following cerebral infarction affecting left non-dominant side: Secondary | ICD-10-CM

## 2016-03-22 DIAGNOSIS — M25512 Pain in left shoulder: Secondary | ICD-10-CM

## 2016-03-22 DIAGNOSIS — R42 Dizziness and giddiness: Secondary | ICD-10-CM

## 2016-03-22 DIAGNOSIS — R293 Abnormal posture: Secondary | ICD-10-CM

## 2016-03-22 DIAGNOSIS — H8111 Benign paroxysmal vertigo, right ear: Secondary | ICD-10-CM

## 2016-03-22 DIAGNOSIS — R2689 Other abnormalities of gait and mobility: Secondary | ICD-10-CM

## 2016-03-22 NOTE — Therapy (Signed)
[facility] [address] [place], [place], [address] Phone: [phone]   Fax:  [phone]  Physical Therapy Treatment  Patient Details  Name: Shelly Mcdaniel MRN: [id number] Date of Birth: 11-17-1955 Referring Provider: Juanda Bond, MD  Encounter Date: 03/22/2016      PT End of Session - 03/22/16 1029    Visit Number 14   Number of Visits 21   Date for PT Re-Evaluation 04/18/16   Authorization Type BCBS 0 visit limit, 0 auth   PT Start Time [phone]   PT Stop Time 0930   PT Time Calculation (min) 44 min   Equipment Utilized During Treatment Gait belt   Activity Tolerance Patient tolerated treatment well;Patient limited by fatigue   Behavior During Therapy [facility] for tasks assessed/performed      Past Medical History:  Diagnosis Date  . Diabetes mellitus without complication ([place])   . Hyperlipidemia   . Hypertension     Past Surgical History:  Procedure Laterality Date  . BACK SURGERY    . BREAST SURGERY    . COLONOSCOPY WITH PROPOFOL N/A 12/24/2013   Procedure: COLONOSCOPY WITH PROPOFOL;  Surgeon: [name], MD;  Location: [facility];  Service: Endoscopy;  Laterality: N/A;    There were no vitals filed for this visit.      Subjective Assessment - 03/22/16 0849    Subjective "I think I have the vertigo back again. When I lay down in the bed, it just spins. It's not quite as vigorous as it was before. We made sure I took Zofran today." Pt also reports having seen physiotrist recently. Physiotrist noted increased spasticity in LUE and recommended pt may need botox injections in LUE.   Limitations House hold activities;Walking   Currently in Pain? No/denies            [facility] PT Assessment - 03/22/16 0001      Observation/Other Assessments   Observations Following gait training, observation of L foot (per patient-expressed discomfort due to "brace rubbing") reveals area of increased pressure  at 5th metatarsal            Vestibular Assessment - 03/22/16 0001      Symptom Behavior   Type of Dizziness Spinning     Positional Testing   Horizontal Canal Testing Horizontal Canal Right;Horizontal Canal Left;Horizontal Canal Right Intensity;Horizontal Canal Left Intensity     Horizontal Canal Right   Horizontal Canal Right Duration > 30 seconds   Horizontal Canal Right Symptoms Geotrophic;Nystagmus     Horizontal Canal Left   Horizontal Canal Left Duration 15 seconds   Horizontal Canal Left Symptoms Geotrophic;Nystagmus     Horizontal Canal Right Intensity   Horizontal Canal Right Intensity Severe     Horizontal Canal Left Intensity   Horizontal Canal Left Intensity Moderate                 [facility] Adult PT Treatment/Exercise - 03/22/16 0001      Transfers   Stand to Sit 4: Min assist   Stand to [employer] Details from lowest mat table   Squat Pivot Transfers 4: Min guard  to L side   Transfer Cueing from w/c > lowest mat table with subtle cueing, increased time for setup     Ambulation/Gait   Ambulation/Gait Yes   Ambulation/Gait Assistance 4: Min guard;4: Min assist   Ambulation/Gait Assistance Details Gait x110' with L PFRW and L AFO, min A for stability/balance, cueing for proximity to  RW and L platform attachement; verbal cueing for wider BOS., decreased step length, to address posterior rotation of trunk on L, and to address LLE circumduction.   Ambulation Distance (Feet) 110 Feet   Assistive device Left platform walker  L custom AFO   Gait Pattern Step-through pattern;Decreased stance time - left;Decreased hip/knee flexion - left;Decreased dorsiflexion - left;Lateral trunk lean to right;Decreased arm swing - left;Decreased weight shift to left;Narrow base of support;Left circumduction;Left genu recurvatum;Ataxic   Ambulation Surface Level;Indoor         Vestibular Treatment/Exercise - 03/22/16 0001      Vestibular Treatment/Exercise   Vestibular  Treatment Provided Canalith Repositioning   Canalith Repositioning Canal Roll Right     Canal Roll Right   Number of Reps  2   Overall Response  Improved Symptoms   Response Details  After R Canal Roll maneuver, reassessment of horizontal canals (-) and asymptomatic.               PT Education - 03/22/16 1011    Education provided Yes   Education Details Recommended pt/husband notify orthotist of area of increased pressure on L foot. Discussed (with pt, husband, and primary OT) use of L PFRW for increased LUE activation, spasticity management, improved midline orientation.    Person(s) Educated Patient   Methods Explanation   Comprehension Verbalized understanding          PT Short Term Goals - 03/10/16 1630      PT SHORT TERM GOAL #1   Title Pt will initiate HEP in order to indicate improved functional mobility and decreased fall risk.  (Target Date: 03/17/16)   Baseline Met 9/27.   Status Achieved     PT SHORT TERM GOAL #2   Title Pt will perform stand pivot transfer w/ LRAD at S level in order to increase independence at home.     Baseline 03/10/16: pt continues to need up to min assist at times for balance.   Status Not Met     PT SHORT TERM GOAL #3   Title Pt will perform bed mobility at S level in order to indicate improved functional independence.     Baseline Met 9/27.   Time 4   Period Weeks   Status Achieved     PT SHORT TERM GOAL #4   Title Pt will ambulate w/ LRAD x 150' at min A level in order to indicate improved household ambulation.     Baseline 03/10/16: pt unable to meet this distance and continues to need varied assistance, min to max, depending on fatigue level.   Status Not Met     PT SHORT TERM GOAL #5   Title Pt will perform PASS and improve score 3 points in order to indicate improved functional mobility.     Baseline 03/10/16: no change in score from 1st assessment, continues to be 25.   Status Not Met           PT Long Term Goals -  02/18/16 1149      PT LONG TERM GOAL #1   Title Pt/spouse will be independent with HEP in order to indicate improved functional mobility and decreased fall risk.  (Target Date: 04/14/16)   Time 8   Period Weeks   Status New     PT LONG TERM GOAL #2   Title Pt will perform stand pivot transfers w/ LRAD at mod I level in order to indicate improved functional independence.    Time 8  Period Weeks   Status New     PT LONG TERM GOAL #3   Title Pt will perform bed mobility at mod I level in order to indicate improved functional mobility.     Time 8   Period Weeks   Status New     PT LONG TERM GOAL #4   Title Pt will ambulate x 150' w/ LRAD at mod I level over indoor surfaces to indicate increased independence with household ambulation.     Time 8   Period Weeks   Status New     PT LONG TERM GOAL #5   Title Pt will ambulate over paved outdoor surfaces w/ LRAD x 200' at S level in order to indicate initiation of community ambulation.    Time 8   Period Weeks   Status New               Plan - 03/22/16 1030    Clinical Impression Statement Pt arrived to session with report of increased dizziness over past 1-2 days. Assessment of horizontal canals reveals horizontal geotropic nystagmus with intensity of symptoms/nystagmus on R > L. Therefore, unable to rule out R horizontal canalithasis. Performed R Canal Roll x2 trials with no symptoms/nystagmus upon reassessment. Remainder of session focused on gait training with L platform RW due to recent increase in LUE spasticity, L shoulder pain. Will continue to assess/address gait pattern with L PFRW.    Rehab Potential Excellent   Clinical Impairments Affecting Rehab Potential Pt very motivated and able to attend therapy 3x/wk   PT Frequency 3x / week  then 2x/week for 4 weeks   PT Duration 4 weeks  then 2x/week for 4 weeks   PT Treatment/Interventions ADLs/Self Care Home Management;Canalith Repostioning;Electrical Stimulation;DME  Instruction;Stair training;Gait training;Functional mobility training;Therapeutic activities;Therapeutic exercise;Balance training;Neuromuscular re-education;Patient/family education;Orthotic Fit/Training;Passive range of motion;Energy conservation;Taping;Vestibular;Visual/perceptual remediation/compensation   PT Next Visit Plan Gait training with L PFRW. Continue to focus on grading/control of lateral weight shift during standing and stand pivot transfers, gait training with [facility].    Consulted and Agree with Plan of Care Patient;Family member/caregiver   Family Member Consulted Husband [name]      Patient will benefit from skilled therapeutic intervention in order to improve the following deficits and impairments:  Abnormal gait, Decreased activity tolerance, Decreased balance, Decreased coordination, Decreased endurance, Decreased knowledge of use of DME, Decreased mobility, Decreased strength, Dizziness, Impaired perceived functional ability, Impaired flexibility, Impaired tone, Impaired UE functional use, Impaired vision/preception, Improper body mechanics, Postural dysfunction  Visit Diagnosis: BPPV (benign paroxysmal positional vertigo), right  Dizziness and giddiness  Hemiplegia and hemiparesis following cerebral infarction affecting left non-dominant side (HCC)  Other abnormalities of gait and mobility     Problem List Patient Active Problem List   Diagnosis Date Noted  . 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 TIA (transient ischemic attack) 11/07/2014  . Hypertension 11/07/2014  . DM II (diabetes mellitus, type II), controlled ([place]) 11/07/2014   Billie Ruddy, PT, [place] [address] [place], [place], [address] Phone: [phone]   Fax:  [phone] 03/22/16, 1:27 PM  Name: ERMINIA MCNEW MRN: [id number] Date of Birth: 1956/01/23

## 2016-03-22 NOTE — Therapy (Signed)
[facility] [address] [place], [place], [address] Phone: [phone]   Fax:  [phone]  Occupational Therapy Treatment  Patient Details  Name: Shelly Mcdaniel MRN: [id number] Date of Birth: 04/28/1956 Referring Provider: Dr. Tobie Lords  Encounter Date: 03/22/2016      OT End of Session - 03/22/16 1123    Visit Number 12   Number of Visits 24   Date for OT Re-Evaluation 04/25/16   Authorization Type BC/BS   OT Start Time 0930   OT Stop Time 1015   OT Time Calculation (min) 45 min   Activity Tolerance Patient tolerated treatment well;Patient limited by pain      Past Medical History:  Diagnosis Date  . Diabetes mellitus without complication (HCC)   . Hyperlipidemia   . Hypertension     Past Surgical History:  Procedure Laterality Date  . BACK SURGERY    . BREAST SURGERY    . COLONOSCOPY WITH PROPOFOL N/A 12/24/2013   Procedure: COLONOSCOPY WITH PROPOFOL;  Surgeon: Charolett Bumpers, MD;  Location: [facility];  Service: Endoscopy;  Laterality: N/A;    There were no vitals filed for this visit.      Subjective Assessment - 03/22/16 1117    Subjective  The taping seems to really help the pain in my Lt shoulder   Patient is accompained by: Family member  husband   Limitations vestibular deficits (central and peripheral deficits), fall risk, Lt AFO, sh. subluxation   Patient Stated Goals I want my hand to come back   Currently in Pain? Yes   Pain Score 6   3/10 at rest, 6/10 with movement   Pain Location Shoulder   Pain Orientation Left   Pain Onset 1 to 4 weeks ago   Pain Frequency Constant   Aggravating Factors  movement, sleeping   Pain Relieving Factors taping, OTC pain meds                      OT Treatments/Exercises (OP) - 03/22/16 1118      ADLs   ADL Comments Pt responded well to taping with pain relief at shoulder. Pt still had tape on (should have been removed  yesterday). Therapist instructed pt/husband to doff today in shower or getting wet to doff, then will re-apply next session.      Neurological Re-education Exercises   Other Exercises 1 AA/ROM in low range LUE flex/ext using tilted stool with mod facilitation and support Lt hand.    Other Exercises 2 Supine: re-inforced proper stretching technique with neutral sh. and forearm rotation (thumb side up) for self ROM in sh. flex/ext   Other Weight-Bearing Exercises 1 Seated: wt bearing over LUE with trunk rotational movements bilaterally, and RUE ipsilateral side reaching for Lt lateral trunk flexion and wt bearing through LUE for depression and elbow extension.                   OT Short Term Goals - 03/15/16 1135      OT SHORT TERM GOAL #1   Title Pt/family independent with initial HEP for LUE (All STG's due 03/26/16)    Status On-going     OT SHORT TERM GOAL #2   Title Pt/family to verbalize understanding with proper positioning of LUE during activity and in bed to prevent pain including use of sling prn   Status Achieved     OT SHORT TERM GOAL #3   Title Pt to  perform UE dressing with min assist using hemi techniques   Status Achieved  03/14/16  min cueing     OT SHORT TERM GOAL #4   Title Pt to perform functional stand pivot transfer for toileting with no more than mod assist    Status Achieved  Currently min assist     OT SHORT TERM GOAL #5   Title Pt to be mod assist with LE dressing using hemi techniques and A/E PRN   Status Achieved  min a           OT Long Term Goals - 03/15/16 1136      OT LONG TERM GOAL #1   Title Independent with updated HEP prn (all LTG's due 04/25/16)    Status On-going     OT LONG TERM GOAL #2   Title Pt to demo active low range sh. flexion to 25 degrees in prep for reaching and use of arm as stabilizer   Status On-going     OT LONG TERM GOAL #3   Title Pt to demo 50% gross finger flexion/extension to grasp/release cylindrical  objects   Status On-going     OT LONG TERM GOAL #4   Title Pt to be supervision level with all BADLS   Status On-going     OT LONG TERM GOAL #5   Title Vision goal TBD    Status New               Plan - 03/22/16 1123    Clinical Impression Statement Pt continues to be limited by LUE shoulder pain (mostly d/t spasticity primarily at biceps) but responds well to kinesiotaping and gentle wt bearing ex's.    Rehab Potential Good   OT Frequency 3x / week   OT Duration 8 weeks   OT Treatment/Interventions Self-care/ADL training;Moist Heat;DME and/or AE instruction;Splinting;Patient/family education;Therapeutic exercises;Compression bandaging;Therapeutic activities;Neuromuscular education;Functional Mobility Training;Passive range of motion;Cognitive remediation/compensation;Electrical Stimulation;Manual Therapy;Dry needling;Visual/perceptual remediation/compensation   Plan re-apply kinesiotape to relax biceps, relax middle deltoid, and activate scapula retraction/trunk extension (Pt/husband to take off following Monday at home, then primary therapist will train husband on applying following session). Also, continue NMR LUE/trunk      Patient will benefit from skilled therapeutic intervention in order to improve the following deficits and impairments:  Decreased activity tolerance, Decreased balance, Decreased cognition, Decreased knowledge of use of DME, Decreased coordination, Decreased endurance, Decreased range of motion, Decreased safety awareness, Decreased skin integrity, Decreased strength, Impaired perceived functional ability, Increased edema, Impaired sensation, Impaired tone, Impaired UE functional use, Impaired vision/preception, Pain, Improper body mechanics  Visit Diagnosis: Hemiplegia and hemiparesis following cerebral infarction affecting left non-dominant side (HCC)  Acute pain of left shoulder  Abnormal posture    Problem List Patient Active Problem List    Diagnosis Date Noted  . 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 TIA (transient ischemic attack) 11/07/2014  . Hypertension 11/07/2014  . DM II (diabetes mellitus, type II), controlled (HCC) 11/07/2014    Kelli ChurnBallie, Burdett Pinzon Johnson, OTR/L 03/22/2016, 11:26 AM  [facility] [address] [place], [place], [address] Phone: [phone]   Fax:  [phone]  Name: Shelly Mcdaniel MRN: [id number] Date of Birth: [birth year]

## 2016-03-23 ENCOUNTER — Encounter: Payer: Self-pay | Admitting: *Deleted

## 2016-03-23 ENCOUNTER — Ambulatory Visit: Payer: BC Managed Care – PPO | Admitting: Physical Therapy

## 2016-03-23 ENCOUNTER — Ambulatory Visit: Payer: BC Managed Care – PPO

## 2016-03-23 ENCOUNTER — Ambulatory Visit: Payer: BC Managed Care – PPO | Admitting: *Deleted

## 2016-03-23 DIAGNOSIS — I69354 Hemiplegia and hemiparesis following cerebral infarction affecting left non-dominant side: Secondary | ICD-10-CM

## 2016-03-23 DIAGNOSIS — R2681 Unsteadiness on feet: Secondary | ICD-10-CM

## 2016-03-23 DIAGNOSIS — R278 Other lack of coordination: Secondary | ICD-10-CM

## 2016-03-23 DIAGNOSIS — M25512 Pain in left shoulder: Secondary | ICD-10-CM

## 2016-03-23 DIAGNOSIS — R471 Dysarthria and anarthria: Secondary | ICD-10-CM

## 2016-03-23 DIAGNOSIS — R2689 Other abnormalities of gait and mobility: Secondary | ICD-10-CM

## 2016-03-23 DIAGNOSIS — M79602 Pain in left arm: Secondary | ICD-10-CM

## 2016-03-23 NOTE — Therapy (Signed)
[facility] [address] [place], [place], [address] Phone: [phone]   Fax:  [phone]  Physical Therapy Treatment  Patient Details  Name: Shelly Mcdaniel MRN: [id number] Date of Birth: 12/17/1955 Referring Provider: Juanda Bond, MD  Encounter Date: 03/23/2016      PT End of Session - 03/23/16 1054    Visit Number 15   Number of Visits 21   Date for PT Re-Evaluation 04/18/16   Authorization Type BCBS 0 visit limit, 0 auth   PT Start Time 1021  Late start due to finishing with SLP   PT Stop Time 1046  Pt in bathroom for final 14 minutes of session   PT Time Calculation (min) 25 min   Equipment Utilized During Treatment Gait belt   Activity Tolerance Other (comment)  Limited by nausea   Behavior During Therapy WFL for tasks assessed/performed      Past Medical History:  Diagnosis Date  . Diabetes mellitus without complication ([place])   . Hyperlipidemia   . Hypertension     Past Surgical History:  Procedure Laterality Date  . BACK SURGERY    . BREAST SURGERY    . COLONOSCOPY WITH PROPOFOL N/A 12/24/2013   Procedure: COLONOSCOPY WITH PROPOFOL;  Surgeon: [name], MD;  Location: [facility];  Service: Endoscopy;  Laterality: N/A;    There were no vitals filed for this visit.      Subjective Assessment - 03/23/16 1057    Subjective Per husband, "We changed the baclofen from as needed to scheduled...now the (left) arm seems to be better."   Patient is accompained by: Family member  husband, [name]   Limitations House hold activities;Walking   Currently in Pain? No/denies                         [facility] Adult PT Treatment/Exercise - 03/23/16 0001      Transfers   Transfers Sit to Stand;Stand to Sit   Sit to Stand 4: Min assist   Sit to Stand Details (indicate cue type and reason) assist for LUE management in L platform attachment   Stand to Sit 4: Min assist   Stand to  Sit Details cueing to remove LUE from L PFRW     Ambulation/Gait   Ambulation/Gait Yes   Ambulation/Gait Assistance 3: Mod assist   Ambulation/Gait Assistance Details Gait x30' with L PFRW, L AFO with cueing for wider BOS, for decreased step length, and for safe/appropriate distance to RW. PT concurrently educating husband on safe assistance, appropriate cueing. Gait trial ended per pt request due to nausea, pt request to use bathroom.   Ambulation Distance (Feet) 30 Feet   Assistive device Left platform walker  L custom AFO   Gait Pattern Step-through pattern;Decreased stance time - left;Decreased hip/knee flexion - left;Decreased dorsiflexion - left;Lateral trunk lean to right;Decreased arm swing - left;Decreased weight shift to left;Narrow base of support;Left circumduction;Left genu recurvatum;Ataxic   Ambulation Surface Level;Indoor                PT Education - 03/23/16 1737    Education provided Yes   Education Details Reiterated education on taking picture of area of increased pressure on L foot and bringing to orthotist and/or using Sharpie to mark area of brace causing increased pressure. Recommended husband not add mole skin or any other material to custom AFO, as this will change pressure distribution. Per husband inquiries, explained rationale for  L platform RW with emphasis on increase LUE WB for increased muscle activation, spasticity management, for improved midline orientation/symmetry of WB.   Person(s) Educated Patient;Spouse   Methods Explanation   Comprehension Verbalized understanding          PT Short Term Goals - 03/10/16 1630      PT SHORT TERM GOAL #1   Title Pt will initiate HEP in order to indicate improved functional mobility and decreased fall risk.  (Target Date: 03/17/16)   Baseline Met 9/27.   Status Achieved     PT SHORT TERM GOAL #2   Title Pt will perform stand pivot transfer w/ LRAD at S level in order to increase independence at home.      Baseline 03/10/16: pt continues to need up to min assist at times for balance.   Status Not Met     PT SHORT TERM GOAL #3   Title Pt will perform bed mobility at S level in order to indicate improved functional independence.     Baseline Met 9/27.   Time 4   Period Weeks   Status Achieved     PT SHORT TERM GOAL #4   Title Pt will ambulate w/ LRAD x 150' at min A level in order to indicate improved household ambulation.     Baseline 03/10/16: pt unable to meet this distance and continues to need varied assistance, min to max, depending on fatigue level.   Status Not Met     PT SHORT TERM GOAL #5   Title Pt will perform PASS and improve score 3 points in order to indicate improved functional mobility.     Baseline 03/10/16: no change in score from 1st assessment, continues to be 25.   Status Not Met           PT Long Term Goals - 02/18/16 1149      PT LONG TERM GOAL #1   Title Pt/spouse will be independent with HEP in order to indicate improved functional mobility and decreased fall risk.  (Target Date: 04/14/16)   Time 8   Period Weeks   Status New     PT LONG TERM GOAL #2   Title Pt will perform stand pivot transfers w/ LRAD at mod I level in order to indicate improved functional independence.    Time 8   Period Weeks   Status New     PT LONG TERM GOAL #3   Title Pt will perform bed mobility at mod I level in order to indicate improved functional mobility.     Time 8   Period Weeks   Status New     PT LONG TERM GOAL #4   Title Pt will ambulate x 150' w/ LRAD at mod I level over indoor surfaces to indicate increased independence with household ambulation.     Time 8   Period Weeks   Status New     PT LONG TERM GOAL #5   Title Pt will ambulate over paved outdoor surfaces w/ LRAD x 200' at S level in order to indicate initiation of community ambulation.    Time 8   Period Weeks   Status New               Plan - 03/23/16 1742    Clinical Impression Statement  Session abbreviated due to pt toileting during final 14 minutes of PT session. Focused on gait training with L PFRW and providing pt/husband with education, per husband questions about PFRW  and L AFO.    Rehab Potential Excellent   Clinical Impairments Affecting Rehab Potential Pt very motivated and able to attend therapy 3x/wk   PT Frequency 3x / week  then 2x/week for 4 weeks   PT Duration 4 weeks  then 2x/week for 4 weeks   PT Treatment/Interventions ADLs/Self Care Home Management;Canalith Repostioning;Electrical Stimulation;DME Instruction;Stair training;Gait training;Functional mobility training;Therapeutic activities;Therapeutic exercise;Balance training;Neuromuscular re-education;Patient/family education;Orthotic Fit/Training;Passive range of motion;Energy conservation;Taping;Vestibular;Visual/perceptual remediation/compensation   PT Next Visit Plan Gait training with L PFRW. Continue to focus on grading/control of lateral weight shift during standing and stand pivot transfers.   Consulted and Agree with Plan of Care Patient;Family member/caregiver   Family Member Consulted Husband [name]      Patient will benefit from skilled therapeutic intervention in order to improve the following deficits and impairments:  Abnormal gait, Decreased activity tolerance, Decreased balance, Decreased coordination, Decreased endurance, Decreased knowledge of use of DME, Decreased mobility, Decreased strength, Dizziness, Impaired perceived functional ability, Impaired flexibility, Impaired tone, Impaired UE functional use, Impaired vision/preception, Improper body mechanics, Postural dysfunction  Visit Diagnosis: Hemiplegia and hemiparesis following cerebral infarction affecting left non-dominant side (HCC)  Other abnormalities of gait and mobility  Unsteadiness on feet     Problem List Patient Active Problem List   Diagnosis Date Noted  . 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 TIA (transient ischemic attack)  11/07/2014  . Hypertension 11/07/2014  . DM II (diabetes mellitus, type II), controlled ([place]) 11/07/2014    Billie Ruddy, PT, [place] [address] [place], [place], [address] Phone: [phone]   Fax:  [phone] 03/23/16, 5:46 PM  Name: Shelly Mcdaniel MRN: [id number] Date of Birth: 02/15/1956

## 2016-03-23 NOTE — Patient Instructions (Addendum)
Vocal Fold Adduction Exercises (for voice and swallowing)  . Super-supraglottic swallow o Take a breath & hold it,  o Bear down  o Swallow, then cough o 5 repetitions, 3x a day  . Vocal fold adduction o Say "Huh!" with mouth open, & hold your breath for 3 seconds o 5 repetitions, 3x a day  . Supraglottic swallow o Take a breath & hold it o Swallow, then gently cough o 5 repetitions, 3x a day  These exercises could be done twice a day with increased repetitions (10-12x), if desired.   If voice becomes worse, stop doing these exercises

## 2016-03-23 NOTE — Therapy (Signed)
[facility] [address] [place], [place], [address] Phone: [phone]   Fax:  [phone]  Occupational Therapy Treatment  Patient Details  Name: Shelly Mcdaniel MRN: [id number] Date of Birth: 1956-04-05 Referring Provider: Dr. Tobie LordsLee Shuping  Encounter Date: 03/23/2016      OT End of Session - 03/23/16 1212    Visit Number 13   Number of Visits 24   Date for OT Re-Evaluation 04/25/16   Authorization Type BC/BS   OT Start Time 1104   OT Stop Time 1154   OT Time Calculation (min) 50 min   Activity Tolerance Patient tolerated treatment well;Patient limited by pain   Behavior During Therapy [facility] for tasks assessed/performed      Past Medical History:  Diagnosis Date  . Diabetes mellitus without complication (HCC)   . Hyperlipidemia   . Hypertension     Past Surgical History:  Procedure Laterality Date  . BACK SURGERY    . BREAST SURGERY    . COLONOSCOPY WITH PROPOFOL N/A 12/24/2013   Procedure: COLONOSCOPY WITH PROPOFOL;  Surgeon: Charolett BumpersMartin K Johnson, MD;  Location: [facility];  Service: Endoscopy;  Laterality: N/A;    There were no vitals filed for this visit.      Subjective Assessment - 03/23/16 1107    Subjective  Pt c/o pain in left shoulder. States that she removed the kinesio tape "just this morning, here in therapy" during PT   Patient is accompained by: Family member  Husband   Limitations vestibular deficits (central and peripheral deficits), fall risk, Lt AFO, sh. subluxation   Patient Stated Goals I want my hand to come back   Currently in Pain? Yes   Pain Score 4    Pain Location Shoulder   Pain Orientation Left   Pain Descriptors / Indicators Aching   Pain Type Acute pain   Pain Onset 1 to 4 weeks ago   Pain Frequency Constant   Aggravating Factors  Movement, sleeping, holding UE in protected position.   Pain Relieving Factors Taping, OTC pain meds   Multiple Pain Sites No                       OT Treatments/Exercises (OP) - 03/23/16 1203      Transfers   Sit to Stand 4: Min assist   Sit to Stand Details (indicate cue type and reason) vc's for LUE with SPT   Stand to Sit 4: Min assist     ADLs   ADL Comments Discussed positioning of LUE when sleeping. Suggest pt try placing LUE on pillow with elbow in slight extension to avoid protected position. Over time, pt/husband were instructed to slowly move pillow out from under hand and therefore encouraging increased resting extension/position and avoidance of sleeping in "protective position" of LUE/elbow lfexed and resting across her body.     Neurological Re-education Exercises   Other Exercises 1 AAROM in low range for LUE flex/ext using tillted stool, mod facillitation & support L hand   Other Exercises 2 Verbal reive wof self ROM home program and positioning    Other Weight-Bearing Exercises 1 Seated on mat table for weight bearing w/ trunk control, rotational movement bilaterally, Left lateral trunk flexion and weight bearing.     Manual Therapy   Manual Therapy Myofascial release;Passive ROM   Manual therapy comments Trigger point release biceps & deltoid left followed by gentle PROM.  OT Education - 03/23/16 1210    Education provided Yes   Education Details Reviewed HEP for self range and proper positioning. Pt to position LUE on pillow when sleeping and slowly decrease use to assist with decreased "protected positioning" of arm and encourage increased ROM   Person(s) Educated Patient;Spouse   Methods Explanation;Demonstration   Comprehension Verbalized understanding;Returned demonstration          OT Short Term Goals - 03/15/16 1135      OT SHORT TERM GOAL #1   Title Pt/family independent with initial HEP for LUE (All STG's due 03/26/16)    Status On-going     OT SHORT TERM GOAL #2   Title Pt/family to verbalize understanding with proper positioning of LUE  during activity and in bed to prevent pain including use of sling prn   Status Achieved     OT SHORT TERM GOAL #3   Title Pt to perform UE dressing with min assist using hemi techniques   Status Achieved  03/14/16  min cueing     OT SHORT TERM GOAL #4   Title Pt to perform functional stand pivot transfer for toileting with no more than mod assist    Status Achieved  Currently min assist     OT SHORT TERM GOAL #5   Title Pt to be mod assist with LE dressing using hemi techniques and A/E PRN   Status Achieved  min a           OT Long Term Goals - 03/15/16 1136      OT LONG TERM GOAL #1   Title Independent with updated HEP prn (all LTG's due 04/25/16)    Status On-going     OT LONG TERM GOAL #2   Title Pt to demo active low range sh. flexion to 25 degrees in prep for reaching and use of arm as stabilizer   Status On-going     OT LONG TERM GOAL #3   Title Pt to demo 50% gross finger flexion/extension to grasp/release cylindrical objects   Status On-going     OT LONG TERM GOAL #4   Title Pt to be supervision level with all BADLS   Status On-going     OT LONG TERM GOAL #5   Title Vision goal TBD    Status New               Plan - 03/23/16 1213    Clinical Impression Statement Pt cont to c/o L shoulder pain mainly in deltoid, biceps d/t spasticity. She reports that kinesiotaping and gentle weight bearing ex's assists. Cont toward POC and goals.   Rehab Potential Good   OT Frequency 3x / week   OT Duration 8 weeks   OT Treatment/Interventions Self-care/ADL training;Moist Heat;DME and/or AE instruction;Splinting;Patient/family education;Therapeutic exercises;Compression bandaging;Therapeutic activities;Neuromuscular education;Functional Mobility Training;Passive range of motion;Cognitive remediation/compensation;Electrical Stimulation;Manual Therapy;Dry needling;Visual/perceptual remediation/compensation   Plan Re-apply kinesiotape to help relax biceps, middle  deltoid and activate scapula retraction/trunk extension next visit (Pt husband to remove tape on following Monday at home and primary therapist will train husband on applying following session). Cont neuro-muscular rehab LUE/Trunk   Consulted and Agree with Plan of Care Patient;Family member/caregiver   Family Member Consulted Husband - [name]      Patient will benefit from skilled therapeutic intervention in order to improve the following deficits and impairments:  Decreased activity tolerance, Decreased balance, Decreased cognition, Decreased knowledge of use of DME, Decreased coordination, Decreased endurance, Decreased range of motion, Decreased  safety awareness, Decreased skin integrity, Decreased strength, Impaired perceived functional ability, Increased edema, Impaired sensation, Impaired tone, Impaired UE functional use, Impaired vision/preception, Pain, Improper body mechanics  Visit Diagnosis: Hemiplegia and hemiparesis following cerebral infarction affecting left non-dominant side (HCC)  Acute pain of left shoulder  Other lack of coordination  Pain in left arm    Problem List Patient Active Problem List   Diagnosis Date Noted  . 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 TIA (transient ischemic attack) 11/07/2014  . Hypertension 11/07/2014  . DM II (diabetes mellitus, type II), controlled (HCC) 11/07/2014    Shelly Mcdaniel Dionicio Stall, OTR/L 03/23/2016, 12:18 PM  [facility] [address] [place], [place], [address] Phone: [phone]   Fax:  [phone]  Name: Shelly Mcdaniel MRN: [id number] Date of Birth: 07/02/1955

## 2016-03-23 NOTE — Therapy (Signed)
[facility] [address] [place], [place], [address] Phone: [phone]   Fax:  [phone]  Speech Language Pathology Evaluation  Patient Details  Name: Shelly Mcdaniel MRN: [id number] Date of Birth: October 05, 1955 Referring Provider: Austin Miles  Encounter Date: 03/23/2016      End of Session - 03/23/16 1052    Visit Number 1   Number of Visits 17   Date for SLP Re-Evaluation 06/03/16   SLP Start Time 0935   SLP Stop Time  1015   SLP Time Calculation (min) 40 min   Activity Tolerance Patient tolerated treatment well      Past Medical History:  Diagnosis Date  . Diabetes mellitus without complication (HCC)   . Hyperlipidemia   . Hypertension     Past Surgical History:  Procedure Laterality Date  . BACK SURGERY    . BREAST SURGERY    . COLONOSCOPY WITH PROPOFOL N/A 12/24/2013   Procedure: COLONOSCOPY WITH PROPOFOL;  Surgeon: Charolett Bumpers, MD;  Location: [facility];  Service: Endoscopy;  Laterality: N/A;    There were no vitals filed for this visit.      Subjective Assessment - 03/23/16 0931    Subjective "Do I have to do that say the numbers backwards thing?"   Patient is accompained by: --  husband   Currently in Pain? 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     Prior Functional Status   Cognitive/Linguistic Baseline Within functional limits    Lives With Spouse   Vocation Student     Cognition   Overall Cognitive  Status Within Functional Limits for tasks assessed  family/pt report pt's cognition at baseline     Auditory Comprehension   Overall Auditory Comprehension Appears within functional limits for tasks assessed     Verbal Expression   Overall Verbal Expression Appears within functional limits for tasks assessed   Other Verbal Expression Comments Pt reports rare/occasional anomia     Oral Motor/Sensory Function   Overall Oral Motor/Sensory Function Impaired   Labial ROM Within Functional Limits   Labial Coordination WFL   Lingual Symmetry Abnormal symmetry left   Lingual Strength Reduced Left   Lingual Coordination WFL   Facial Symmetry Left droop   Facial Strength Within Functional Limits   Velum Within Functional Limits     Motor Speech   Overall Motor Speech --  pt concerned with her monotone pitch   Phonation Hoarse   Phonation Impaired   Vocal Abuses Vocal Fold Dehydration   Volume Decibel Level;Soft  <70dB conv; loud "hey" avg 83dB; 13.25 seconds avg sust /a/   Pitch --  flat at times     When pt attempted vocal adduction exercises ("HUH!") good vocal fold closure was heard, however pt's sustained /a/ is below WNL, and pt's volume is <WNL as well.   SLP recommends ENT eval with  an ENT practice who can assess using videostroboscopy, given pt's abnormal vocal measures during eval today, and also for further diagnostic information about manner of vocal fold vibratory pattern. Given this information, SLP can further shape patient's ST plan of care and set appropriate therapy goals.                    SLP Education - 03/23/16 1051    Education provided Yes   Education Details HEP for voice strength/volume, ST plan of care, suggestion of ENT eval to Dr. Kathlen BrunswickLipscomb, swallowing no >two meds at a time   Person(s) Educated Patient;Spouse   Methods Explanation;Handout;Demonstration;Verbal cues   Comprehension Verbalized understanding;Returned demonstration           SLP Short Term Goals - 03/23/16 1058      SLP SHORT TERM GOAL #1   Title pt will perform HEP with rare min A over three sessions   Time 4   Period Weeks   Status New     SLP SHORT TERM GOAL #2   Title pt will improve max phonation time to average 15.5 seconds   Time 4   Period Weeks   Status New          SLP Long Term Goals - 03/23/16 1059      SLP LONG TERM GOAL #1   Title pt will complete HEP with independence over two sessions   Time 8   Period Weeks   Status New     SLP LONG TERM GOAL #2   Title pt's max phonation time will improve to 16.5 seconds average   Time 8   Period Weeks   Status New          Plan - 03/23/16 1053    Clinical Impression Statement 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 SLP suspects some sort of vocal fold abnormality but in the absence of ENT testing including assessment of vocal fold vibratory pattern, pt was provided with HEP for vocal adduction/strengthening to get her started. SLP recommends ENT eval with stroboscopy to pinpoint ST goals to tailor to pt's deificits.   Speech Therapy Frequency 2x / week   Duration --  8 weeks   Treatment/Interventions Patient/family education;Compensatory techniques;Internal/external aids;SLP instruction and feedback  HEP for voice   Potential to Achieve Goals Good      Patient will benefit from skilled therapeutic intervention in order to improve the following deficits and impairments:   Dysarthria and anarthria    Problem List Patient Active Problem List   Diagnosis Date Noted  . 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 TIA (transient ischemic attack) 11/07/2014  . Hypertension 11/07/2014  . DM II (diabetes mellitus, type II), controlled (HCC) 11/07/2014    Chavela Justiniano ,MS, CCC-SLP  03/23/2016, 11:02 AM  [facility] [address] [place], [place], [address] Phone: [phone]   Fax:  [phone]  Name: Shelly Mcdaniel MRN: [id number] Date of Birth: 1955-09-27

## 2016-03-24 ENCOUNTER — Encounter: Payer: Self-pay | Admitting: *Deleted

## 2016-03-24 ENCOUNTER — Ambulatory Visit: Payer: BC Managed Care – PPO | Admitting: *Deleted

## 2016-03-24 ENCOUNTER — Encounter: Payer: Self-pay | Admitting: Rehabilitation

## 2016-03-24 ENCOUNTER — Ambulatory Visit: Payer: BC Managed Care – PPO | Admitting: Rehabilitation

## 2016-03-24 DIAGNOSIS — I69354 Hemiplegia and hemiparesis following cerebral infarction affecting left non-dominant side: Secondary | ICD-10-CM | POA: Diagnosis not present

## 2016-03-24 DIAGNOSIS — R2681 Unsteadiness on feet: Secondary | ICD-10-CM

## 2016-03-24 DIAGNOSIS — M79602 Pain in left arm: Secondary | ICD-10-CM

## 2016-03-24 DIAGNOSIS — R2689 Other abnormalities of gait and mobility: Secondary | ICD-10-CM

## 2016-03-24 DIAGNOSIS — R278 Other lack of coordination: Secondary | ICD-10-CM

## 2016-03-24 DIAGNOSIS — R293 Abnormal posture: Secondary | ICD-10-CM

## 2016-03-24 DIAGNOSIS — M25512 Pain in left shoulder: Secondary | ICD-10-CM

## 2016-03-24 NOTE — Therapy (Signed)
[facility] [address] [place], [place], [address] Phone: [phone]   Fax:  [phone]  Occupational Therapy Treatment  Patient Details  Name: Shelly SimmerJody D Pund MRN: [id number] Date of Birth: [birth year] Referring Provider: Dr. Tobie LordsLee Shuping  Encounter Date: 03/24/2016      OT End of Session - 03/24/16 1318    Visit Number 14   Number of Visits 24   Date for OT Re-Evaluation 04/25/16   Authorization Type BC/BS   OT Start Time 0934   OT Stop Time 1015   OT Time Calculation (min) 41 min   Activity Tolerance Patient tolerated treatment well   Behavior During Therapy [facility] for tasks assessed/performed      Past Medical History:  Diagnosis Date  . Diabetes mellitus without complication (HCC)   . Hyperlipidemia   . Hypertension     Past Surgical History:  Procedure Laterality Date  . BACK SURGERY    . BREAST SURGERY    . COLONOSCOPY WITH PROPOFOL N/A 12/24/2013   Procedure: COLONOSCOPY WITH PROPOFOL;  Surgeon: Charolett BumpersMartin K Johnson, MD;  Location: [facility];  Service: Endoscopy;  Laterality: N/A;    There were no vitals filed for this visit.      Subjective Assessment - 03/24/16 0939    Subjective  Pt c/o pain left UE/shoulder last noc. States that she iced it and used lidocane patches. "I look forward to taping it again"   Patient is accompained by: Family member   Limitations vestibular deficits (central and peripheral deficits), fall risk, Lt AFO, sh. subluxation   Patient Stated Goals I want my hand to come back   Currently in Pain? Yes   Pain Score 1    Pain Location Shoulder   Pain Orientation Left   Pain Descriptors / Indicators Aching   Pain Type Acute pain   Pain Onset 1 to 4 weeks ago   Pain Frequency Constant   Aggravating Factors  Movement, sleeping.   Pain Relieving Factors Taping, OTC pain meds; holding LUE in protected position.   Multiple Pain Sites No                       OT Treatments/Exercises (OP) - 03/24/16 1309      Transfers   Sit to Stand 4: Min assist  Transfer from w/c to straighten clothing before/after taping   Stand to Sit 4: Min assist     ADLs   ADL Comments Pt reports that she wore resting hand splint this morning secondary to "I forgot last night"     Neurological Re-education Exercises   Other Exercises 1 AAROM low range for LUE shoulder flexion/extension, horizontal ABD, IR/ER seated in w/c   Other Weight-Bearing Exercises 1 Seated in w/c for WB through LUE elbow, shoulder and wrist; trunk contorl     Manual Therapy   Manual Therapy Taping   Manual therapy comments Trigger point release deltoid and biceps after gentle PROM   Kinesiotex Inhibit Muscle;Facilitate Muscle  Assist/relax biceps, middle deltoid & activate scapula retraction/trunk extension.                OT Education - 03/24/16 1316    Education provided Yes   Education Details Spouse to remove kinesiotape LUE on Monday, 03/28/16 at home. Primary therapist will train husband on applying next session.   Person(s) Educated Patient;Spouse   Methods Explanation   Comprehension Verbalized understanding  OT Short Term Goals - 03/15/16 1135      OT SHORT TERM GOAL #1   Title Pt/family independent with initial HEP for LUE (All STG's due 03/26/16)    Status On-going     OT SHORT TERM GOAL #2   Title Pt/family to verbalize understanding with proper positioning of LUE during activity and in bed to prevent pain including use of sling prn   Status Achieved     OT SHORT TERM GOAL #3   Title Pt to perform UE dressing with min assist using hemi techniques   Status Achieved  03/14/16  min cueing     OT SHORT TERM GOAL #4   Title Pt to perform functional stand pivot transfer for toileting with no more than mod assist    Status Achieved  Currently min assist     OT SHORT TERM GOAL #5   Title Pt to be mod assist with LE dressing using hemi techniques and  A/E PRN   Status Achieved  min a           OT Long Term Goals - 03/15/16 1136      OT LONG TERM GOAL #1   Title Independent with updated HEP prn (all LTG's due 04/25/16)    Status On-going     OT LONG TERM GOAL #2   Title Pt to demo active low range sh. flexion to 25 degrees in prep for reaching and use of arm as stabilizer   Status On-going     OT LONG TERM GOAL #3   Title Pt to demo 50% gross finger flexion/extension to grasp/release cylindrical objects   Status On-going     OT LONG TERM GOAL #4   Title Pt to be supervision level with all BADLS   Status On-going     OT LONG TERM GOAL #5   Title Vision goal TBD    Status New               Plan - 03/24/16 1319    Clinical Impression Statement Pt reports that kinesiotaping assists with pain LUE - especially to assist with relaxing middle deltoid & biceps and to activate scapula retraction/trunk control. Pt will cont to benefit from out-pt therapy for pt/family education and neuromuscular retraining.   Rehab Potential Good   OT Frequency 3x / week   OT Duration 8 weeks   OT Treatment/Interventions Self-care/ADL training;Moist Heat;DME and/or AE instruction;Splinting;Patient/family education;Therapeutic exercises;Compression bandaging;Therapeutic activities;Neuromuscular education;Functional Mobility Training;Passive range of motion;Cognitive remediation/compensation;Electrical Stimulation;Manual Therapy;Dry needling;Visual/perceptual remediation/compensation   Plan Primary therapist will instruct husband in kinesiotaping LUE; neuro-muscular rehab LUE and trunk control.   Consulted and Agree with Plan of Care Patient;Family member/caregiver   Family Member Consulted Husband - [name]      Patient will benefit from skilled therapeutic intervention in order to improve the following deficits and impairments:  Decreased activity tolerance, Decreased balance, Decreased cognition, Decreased knowledge of use of DME, Decreased  coordination, Decreased endurance, Decreased range of motion, Decreased safety awareness, Decreased skin integrity, Decreased strength, Impaired perceived functional ability, Increased edema, Impaired sensation, Impaired tone, Impaired UE functional use, Impaired vision/preception, Pain, Improper body mechanics  Visit Diagnosis: Hemiplegia and hemiparesis following cerebral infarction affecting left non-dominant side (HCC)  Acute pain of left shoulder  Other lack of coordination  Pain in left arm    Problem List Patient Active Problem List   Diagnosis Date Noted  . 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 TIA (transient ischemic attack) 11/07/2014  . Hypertension 11/07/2014  .  DM II (diabetes mellitus, type II), controlled (HCC) 11/07/2014    [place], Amy Dionicio Stall, OTR/L 03/24/2016, 1:24 PM  [facility] [address] [place], [place], [address] Phone: [phone]   Fax:  [phone]  Name: ELECIA SERAFIN MRN: [id number] Date of Birth: 02/10/1956

## 2016-03-24 NOTE — Therapy (Signed)
[facility] [address] [place], [place], [address] Phone: [phone]   Fax:  [phone]  Physical Therapy Treatment  Patient Details  Name: Shelly Mcdaniel MRN: [id number] Date of Birth: [birth year] Referring Provider: Juanda Bond, MD  Encounter Date: 03/24/2016      PT End of Session - 03/24/16 1307    Visit Number 16   Number of Visits 21   Date for PT Re-Evaluation 04/18/16   Authorization Type BC[employer] 0 visit limit, 0 auth   PT Start Time 1020   PT Stop Time 1103   PT Time Calculation (min) 43 min   Equipment Utilized During Treatment Gait belt   Activity Tolerance Other (comment)  Limited by nausea   Behavior During Therapy WFL for tasks assessed/performed      Past Medical History:  Diagnosis Date  . Diabetes mellitus without complication ([place])   . Hyperlipidemia   . Hypertension     Past Surgical History:  Procedure Laterality Date  . BACK SURGERY    . BREAST SURGERY    . COLONOSCOPY WITH PROPOFOL N/A 12/24/2013   Procedure: COLONOSCOPY WITH PROPOFOL;  Surgeon: [name], MD;  Location: [facility];  Service: Endoscopy;  Laterality: N/A;    There were no vitals filed for this visit.      Subjective Assessment - 03/24/16 1251    Subjective "This tape is really helping my shoulder."    Patient is accompained by: Family member   Limitations House hold activities;Walking   Currently in Pain? Yes   Pain Score 2    Pain Location Shoulder   Pain Orientation Left   Pain Descriptors / Indicators Aching   Pain Type Acute pain   Pain Onset 1 to 4 weeks ago   Pain Frequency Constant   Aggravating Factors  movement, sleep   Pain Relieving Factors taping, OTC meds                         [facility] Adult PT Treatment/Exercise - 03/24/16 0001      Transfers   Transfers Sit to Stand;Stand to Sit   Sit to Stand 4: Min assist   Sit to Stand Details Verbal cues for  sequencing;Verbal cues for technique;Verbal cues for precautions/safety;Manual facilitation for weight shifting;Manual facilitation for weight bearing   Stand to Sit 4: Min assist   Squat Pivot Transfers 4: Min guard   Comments Sit<>stand performed several times within session with focus on midline orientation, upright head/chest posture with forward flexion from hips and with facilitation at trunk to correct this and assist for improved forward weight shift and increased WB on LLE with sit<>stand.       Ambulation/Gait   Ambulation/Gait Yes   Ambulation/Gait Assistance 4: Min assist;3: Mod assist   Ambulation/Gait Assistance Details Continue to work on gait with use of RW and L platform for LUE WB and support during gait. Performed 115' and another 85' (limited on this bout due to pain in foot from brace).  Continue to provide cues for posture, increased L hip protraction but in slow graded manner as she tends to move in mass movement pattern, causing LOB.  Also requires cues for keeping RW around her as she tends to push it too far ahead causing forward flexed posture and LLE not able to keep up with body.  Donned surgical shoe cap for improved L foot clearance.  Continue to note that pt compensates with  overt trunk movements, therefore cues for that as well.  Facilitation for upright posture, improved weight shift and assist to guide RW as she lacks control on L side to guide.     Ambulation Distance (Feet) 115 Feet  20'   Assistive device Left platform walker   Gait Pattern Step-through pattern;Decreased stance time - left;Decreased hip/knee flexion - left;Decreased dorsiflexion - left;Lateral trunk lean to right;Decreased arm swing - left;Decreased weight shift to left;Narrow base of support;Left circumduction;Left genu recurvatum;Ataxic   Ambulation Surface Level;Indoor     Self-Care   Self-Care Other Self-Care Comments   Other Self-Care Comments  Note pt states increased pain in L foot from  brace, therefore had pt return to sitting to assess.  Note area of pressure/redness just distal to 5th met head.  Arranged for pt to go to [facility] today to have Orthotist modify as needed.  Note that husband had placed bandaid on pts foot, however discussed how this can disrupt pressure distribution and would wait until they can get brace modified.  Both verbalized understanding.                  PT Education - 03/24/16 1307    Education provided Yes   Education Details Continue to educate on not adding anything to brace or foot as to change pressure distribution.  PT arranged for pt to go to [facility] following session in order modify and relieve pressure.     Person(s) Educated Patient;Spouse   Methods Explanation   Comprehension Verbalized understanding          PT Short Term Goals - 03/10/16 1630      PT SHORT TERM GOAL #1   Title Pt will initiate HEP in order to indicate improved functional mobility and decreased fall risk.  (Target Date: 03/17/16)   Baseline Met 9/27.   Status Achieved     PT SHORT TERM GOAL #2   Title Pt will perform stand pivot transfer w/ LRAD at S level in order to increase independence at home.     Baseline 03/10/16: pt continues to need up to min assist at times for balance.   Status Not Met     PT SHORT TERM GOAL #3   Title Pt will perform bed mobility at S level in order to indicate improved functional independence.     Baseline Met 9/27.   Time 4   Period Weeks   Status Achieved     PT SHORT TERM GOAL #4   Title Pt will ambulate w/ LRAD x 150' at min A level in order to indicate improved household ambulation.     Baseline 03/10/16: pt unable to meet this distance and continues to need varied assistance, min to max, depending on fatigue level.   Status Not Met     PT SHORT TERM GOAL #5   Title Pt will perform PASS and improve score 3 points in order to indicate improved functional mobility.     Baseline 03/10/16: no change in score from 1st  assessment, continues to be 25.   Status Not Met           PT Long Term Goals - 02/18/16 1149      PT LONG TERM GOAL #1   Title Pt/spouse will be independent with HEP in order to indicate improved functional mobility and decreased fall risk.  (Target Date: 04/14/16)   Time 8   Period Weeks   Status New     PT LONG TERM  GOAL #2   Title Pt will perform stand pivot transfers w/ LRAD at mod I level in order to indicate improved functional independence.    Time 8   Period Weeks   Status New     PT LONG TERM GOAL #3   Title Pt will perform bed mobility at mod I level in order to indicate improved functional mobility.     Time 8   Period Weeks   Status New     PT LONG TERM GOAL #4   Title Pt will ambulate x 150' w/ LRAD at mod I level over indoor surfaces to indicate increased independence with household ambulation.     Time 8   Period Weeks   Status New     PT LONG TERM GOAL #5   Title Pt will ambulate over paved outdoor surfaces w/ LRAD x 200' at S level in order to indicate initiation of community ambulation.    Time 8   Period Weeks   Status New               Plan - 03/24/16 1308    Clinical Impression Statement Skilled session focused on PT arranging to have pt/spouse go to [facility] following session to modify brace for pressure relief, sit<>stand for improved technique with cues on how to better perform at home, and gait with L PFRW and L AFO.     Rehab Potential Excellent   Clinical Impairments Affecting Rehab Potential Pt very motivated and able to attend therapy 3x/wk   PT Frequency 3x / week  then 2x/week for 4 weeks   PT Duration 4 weeks  then 2x/week for 4 weeks   PT Treatment/Interventions ADLs/Self Care Home Management;Canalith Repostioning;Electrical Stimulation;DME Instruction;Stair training;Gait training;Functional mobility training;Therapeutic activities;Therapeutic exercise;Balance training;Neuromuscular re-education;Patient/family education;Orthotic  Fit/Training;Passive range of motion;Energy conservation;Taping;Vestibular;Visual/perceptual remediation/compensation   PT Next Visit Plan Gait training with L PFRW. Continue to focus on grading/control of lateral weight shift during standing and stand pivot transfers.   Consulted and Agree with Plan of Care Patient;Family member/caregiver   Family Member Consulted Husband [name]      Patient will benefit from skilled therapeutic intervention in order to improve the following deficits and impairments:  Abnormal gait, Decreased activity tolerance, Decreased balance, Decreased coordination, Decreased endurance, Decreased knowledge of use of DME, Decreased mobility, Decreased strength, Dizziness, Impaired perceived functional ability, Impaired flexibility, Impaired tone, Impaired UE functional use, Impaired vision/preception, Improper body mechanics, Postural dysfunction  Visit Diagnosis: Hemiplegia and hemiparesis following cerebral infarction affecting left non-dominant side (HCC)  Other abnormalities of gait and mobility  Unsteadiness on feet  Abnormal posture     Problem List Patient Active Problem List   Diagnosis Date Noted  . 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 TIA (transient ischemic attack) 11/07/2014  . Hypertension 11/07/2014  . DM II (diabetes mellitus, type II), controlled ([place]) 11/07/2014    Cameron Sprang, PT, MPT [facility] [address] [place], [place], [address] Phone: [phone]   Fax:  [phone] 03/24/16, 1:11 PM  Name: Shelly Mcdaniel MRN: [id number] Date of Birth: [birth year]

## 2016-03-25 ENCOUNTER — Ambulatory Visit: Payer: BC Managed Care – PPO

## 2016-03-25 ENCOUNTER — Telehealth: Payer: Self-pay | Admitting: Rehabilitation

## 2016-03-25 DIAGNOSIS — R471 Dysarthria and anarthria: Secondary | ICD-10-CM

## 2016-03-25 DIAGNOSIS — I69354 Hemiplegia and hemiparesis following cerebral infarction affecting left non-dominant side: Secondary | ICD-10-CM | POA: Diagnosis not present

## 2016-03-25 NOTE — Patient Instructions (Signed)
VOICE CONSERVATION PROGRAM  1. Avoid overuse of voice or excessive use of the voice . The vocal cords can become easily fatigued. Try to sort out what is "necessary" versus "unnecessary" talking in your environment. You do not need to STOP talking, but try to limit it as much as possible.  . Think of resting your voice just as long as you talk. For example, if you talk for 5 minutes, rest your voice completely for 5 minutes. . If your voice feels "tired" during the day, try to rest it as much as possible.  2. Avoid using an excessively loud voice or shouting/raising your voice . When you yell or raise your voice, the vocal cords slam into each other, much like a strong hand clap. This causes irritation, and if this irritation continues, hoarseness may increase. . If people in your home talk loudly, ask them to reduce the volume of their voices to help you decrease your volume as well. . Sit near or face the person to whom you are speaking.   3. Avoid talking over background noise . When talking in background noise, speech automatically has increased loudness, and as in number 2 above, continued loud speech can result in increased hoarseness due to irritation to the vocal cords.  . Do not talk over the radio or the TV. Mute them before speaking, go to a quieter place to talk, or sit next to the person with whom you are watching.  4. Talk in a voice that is soft, smooth, and gentle . This allows the vocal cords to come together in a gentle way and allows the air being exhaled from the lungs to do most/all of the work when you are speaking.  5. Avoid excessive throat clearing, coughing, and loud laughter . During these activities the vocal cords can also be slammed together in a hard way and foster irritation or swelling, which can cause hoarseness. . Try taking sips of room temperature/cool water with hard swallows to clear secretions from the throat, instead of throat clearing or  coughing. . If you absolutely must clear your throat, do so as gently as possible. If you find yourself clearing your throat or coughing a lot, consult your physician. . You will want to laugh. Continue to do so, but softly and gently. Do not laugh loudly for long periods of time because this can increase the chances for vocal fold irritation and thus hoarseness. . Lozenges can help reduce the need to clear throat/cough during cold/allergy season.  6. Keep the mouth and throat lubricated . Drink at least 8-10 8 oz. glasses of water per day (64-80 oz.). This water can come in the form of drink mixes.  . Caffeinated beverages such as colas, coffee, and tea (hot tea AND sweet tea) dry out the vocal cords and then can cause irritation and thus hoarseness.  . Drinking water will keep your body hydrated. This will help to decrease secretions in the throat, which cause people to clear their throats or cough. . If you are exercising outside (especially during drier weather), try to breathe through your nose as much as possible. If this is not possible, lifting the tongue up behind the upper teeth when breathing through the mouth will add some moisture to the air breathed in past the vocal cords.  7. Avoid mouth breathing - breathe through your nose . Your nose serves as a natural filter for dust and dirt particles from the air, and as a humidifier to   moisten the air. Vocal cords like to work in moistened, filtered air. When you breathe through your mouth you lose the air filtering and moistening benefits of breathing through the nose. . Be aware of breathing patterns when sitting quietly (e.g., reading or watching TV). Increase your awareness and try to change habits from mouth breathing to nose breathing during those times.  8. Avoid environmental and/or ingested irritants . Try to avoid smoke-filled and or dusty environments. These items dry out the vocal cords and cause irritation.  9. Use an air filter  if the home is dusty, and/or a humidifier if the air is dry  . This will help to maintain clean, humid air to breathe. Remember, this type of air is what the vocal cords like best.  

## 2016-03-25 NOTE — Telephone Encounter (Deleted)
Dr. Wilson SingerHudson,   I am working with Kandace ParkinsJody Croker for PT at [facility].  We have been utilizing L PFRW for improved WB through LUE, pain control and improved midline orientation.  This had made a huge difference.  Please write order for L platform as she has RW already.    Thanks, . Harriet ButteEmily Jahanna Raether, PT, MPT [facility] [address] [place], [place], [address] Phone: [phone]   Fax:  [phone] 03/25/16, 11:16 AM

## 2016-03-25 NOTE — Therapy (Signed)
[facility] [address] [place], [place], [address] Phone: [phone]   Fax:  [phone]  Speech Language Pathology Treatment  Patient Details  Name: Shelly SimmerJody D Mcdaniel MRN: [id number] Date of Birth: [birth year] Referring Provider: Austin MilesLipscomb-Hudson, Angela  Encounter Date: 03/25/2016      End of Session - 03/25/16 1151    SLP Start Time 1106   SLP Stop Time  1148   SLP Time Calculation (min) 42 min   Activity Tolerance Patient tolerated treatment well      Past Medical History:  Diagnosis Date  . Diabetes mellitus without complication (HCC)   . Hyperlipidemia   . Hypertension     Past Surgical History:  Procedure Laterality Date  . BACK SURGERY    . BREAST SURGERY    . COLONOSCOPY WITH PROPOFOL N/A 12/24/2013   Procedure: COLONOSCOPY WITH PROPOFOL;  Surgeon: Charolett BumpersMartin K Johnson, MD;  Location: [facility];  Service: Endoscopy;  Laterality: N/A;    There were no vitals filed for this visit.      Subjective Assessment - 03/25/16 1113    Subjective "(The HEP) has been going pretty well."   Patient is accompained by: Family member  husband               ADULT SLP TREATMENT - 03/25/16 1114      General Information   Behavior/Cognition Alert;Cooperative;Pleasant mood   Patient Positioning Upright in chair  wheelchair     Pain Assessment   Pain Assessment 0-10   Pain Score 2    Pain Location lt shoulder   Pain Descriptors / Indicators Sore   Pain Intervention(s) Monitored during session     Cognitive-Linquistic Treatment   Treatment focused on Voice   Skilled Treatment SLP educated pt re: vocal hygiene/conservation and pt stated going to key on confidential voice and reducing/conserving voice throughout the day. Vocal adduction exercises were completed with occasional min verbal and demo cues     Assessment / Recommendations / Plan   Plan Continue with current plan of care     Progression  Toward Goals   Progression toward goals Progressing toward goals          SLP Education - 03/25/16 1121    Education provided Yes   Education Details voice hygiene/conservation   Person(s) Educated Patient   Methods Explanation;Handout   Comprehension Verbalized understanding          SLP Short Term Goals - 03/25/16 1152      SLP SHORT TERM GOAL #1   Title pt will perform HEP with rare min A over three sessions   Time 4   Period Weeks   Status On-going     SLP SHORT TERM GOAL #2   Title pt will improve max phonation time to average 15.5 seconds   Time 4   Period Weeks   Status On-going          SLP Long Term Goals - 03/25/16 1152      SLP LONG TERM GOAL #1   Title pt will complete HEP with independence over two sessions   Time 8   Period Weeks   Status On-going     SLP LONG TERM GOAL #2   Title pt's max phonation time will improve to 16.5 seconds average   Time 8   Period Weeks   Status On-going          Plan - 03/25/16 1151    Clinical Impression Statement Pt  presents today with cont'd dysarthria following CVA, as manifested by mod hoarseness, reduced conversational loudness. SLP recommends cont'd skilled ST to address vocal deficits.   Speech Therapy Frequency 2x / week   Duration --  8 weeks   Treatment/Interventions Patient/family education;Compensatory techniques;Internal/external aids;SLP instruction and feedback  HEP for voice   Potential to Achieve Goals Good      Patient will benefit from skilled therapeutic intervention in order to improve the following deficits and impairments:   Dysarthria and anarthria    Problem List Patient Active Problem List   Diagnosis Date Noted  . 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 TIA (transient ischemic attack) 11/07/2014  . Hypertension 11/07/2014  . DM II (diabetes mellitus, type II), controlled (HCC) 11/07/2014    SCHINKE,CARL ,MS, CCC-SLP  03/25/2016, 11:53 AM  [facility] [address] [place], [place], [address] Phone: [phone]   Fax:  [phone]   Name: Shelly Mcdaniel MRN: [id number] Date of Birth: 09/12/1955

## 2016-03-25 NOTE — Telephone Encounter (Signed)
N/a-entered in error

## 2016-03-28 ENCOUNTER — Encounter: Payer: Self-pay | Admitting: Rehabilitation

## 2016-03-28 ENCOUNTER — Ambulatory Visit: Payer: BC Managed Care – PPO | Admitting: Rehabilitation

## 2016-03-28 ENCOUNTER — Ambulatory Visit: Payer: BC Managed Care – PPO | Admitting: Occupational Therapy

## 2016-03-28 ENCOUNTER — Encounter: Payer: Self-pay | Admitting: Occupational Therapy

## 2016-03-28 DIAGNOSIS — R41842 Visuospatial deficit: Secondary | ICD-10-CM

## 2016-03-28 DIAGNOSIS — R293 Abnormal posture: Secondary | ICD-10-CM

## 2016-03-28 DIAGNOSIS — I69354 Hemiplegia and hemiparesis following cerebral infarction affecting left non-dominant side: Secondary | ICD-10-CM

## 2016-03-28 DIAGNOSIS — R278 Other lack of coordination: Secondary | ICD-10-CM

## 2016-03-28 DIAGNOSIS — R2681 Unsteadiness on feet: Secondary | ICD-10-CM

## 2016-03-28 DIAGNOSIS — R2689 Other abnormalities of gait and mobility: Secondary | ICD-10-CM

## 2016-03-28 DIAGNOSIS — M25512 Pain in left shoulder: Secondary | ICD-10-CM

## 2016-03-28 NOTE — Therapy (Signed)
[facility] [address] [place], [place], [address] Phone: [phone]   Fax:  [phone]  Physical Therapy Treatment  Patient Details  Name: Shelly Mcdaniel MRN: [id number] Date of Birth: Sep 10, 1955 Referring Provider: Juanda Bond, MD  Encounter Date: 03/28/2016      PT End of Session - 03/28/16 1249    Visit Number 17   Number of Visits 21   Date for PT Re-Evaluation 04/18/16   Authorization Type BCBS 0 visit limit, 0 auth   PT Start Time 1017   PT Stop Time 1102   PT Time Calculation (min) 45 min   Equipment Utilized During Treatment Gait belt   Activity Tolerance Other (comment)  Limited by nausea   Behavior During Therapy WFL for tasks assessed/performed      Past Medical History:  Diagnosis Date  . Diabetes mellitus without complication ([place])   . Hyperlipidemia   . Hypertension     Past Surgical History:  Procedure Laterality Date  . BACK SURGERY    . BREAST SURGERY    . COLONOSCOPY WITH PROPOFOL N/A 12/24/2013   Procedure: COLONOSCOPY WITH PROPOFOL;  Surgeon: [name], MD;  Location: [facility];  Service: Endoscopy;  Laterality: N/A;    There were no vitals filed for this visit.      Subjective Assessment - 03/28/16 1020    Subjective Per husband, was over medicated on Friday so rested over the weekend.     Patient is accompained by: Family member   Limitations House hold activities;Walking   Currently in Pain? Yes   Pain Score 2    Pain Location Shoulder   Pain Orientation Left   Pain Descriptors / Indicators Aching   Pain Type Acute pain   Pain Onset 1 to 4 weeks ago   Pain Frequency Constant   Aggravating Factors  movement, sleep   Pain Relieving Factors taping, OTC meds                         [facility] Adult PT Treatment/Exercise - 03/28/16 1030      Transfers   Transfers Sit to Stand;Stand to Sit;Squat Pivot Transfers   Sit to Stand 4: Min  assist   Sit to Stand Details Verbal cues for sequencing;Verbal cues for technique;Verbal cues for precautions/safety;Manual facilitation for weight shifting;Manual facilitation for weight bearing   Stand to Sit 4: Min assist   Stand to Sit Details (indicate cue type and reason) Verbal cues for sequencing;Verbal cues for technique;Verbal cues for precautions/safety;Verbal cues for safe use of DME/AE;Manual facilitation for weight bearing;Manual facilitation for weight shifting   Squat Pivot Transfers 5: Supervision   Squat Pivot Transfer Details (indicate cue type and reason) Performed squat pivot for increased independence at home.     Comments sit<>stand performed during session x 15 reps with use of mirror for increased visual feedback on midline orientation as pt continues to stand with increased weight shift and WB on RLE, trunk slight rotated to the R and forward slumped posture.  Emphasis on upright posture with forward flexion from pelvis, slow controlled movement into sitting and standing., increased weight shift to the L, and decreased LLE internal rotation/adduction when sitting.  Placed soccer ball during last 5 reps to assist with this.  Education to husband on cuing pt for these things.      Ambulation/Gait   Ambulation/Gait Yes   Ambulation/Gait Assistance 4: Min assist   Ambulation/Gait Assistance  Details Continue to work on gait with use of L PFRW.  Performed 350' with single seated rest break due to pt feeling hypoglycemic (provided crackers and soda).  Facilitation for upright posture, relaxed R shoulder elevation, improved forward lateral weight shift onto LLE duirng stance, maintaining correct positioning inside of RW with assist to steer at times due to LUE weakness, tends to veer L.     Assistive device Left platform walker   Gait Pattern Step-through pattern;Decreased stance time - left;Decreased hip/knee flexion - left;Decreased dorsiflexion - left;Lateral trunk lean to  right;Decreased arm swing - left;Decreased weight shift to left;Narrow base of support;Left circumduction;Left genu recurvatum;Ataxic   Ambulation Surface Level;Indoor     Self-Care   Self-Care Other Self-Care Comments   Other Self-Care Comments  Education on how to better assist/cue pt for sit<>stands at home, safety with gait with PFRW.                  PT Education - 03/28/16 1023    Education provided Yes   Education Details Education on positioning of LEs while in chair, not only posture and LUE positioning.  Safety with gait with L PFRW   Person(s) Educated Patient;Spouse   Methods Explanation;Demonstration   Comprehension Verbalized understanding          PT Short Term Goals - 03/10/16 1630      PT SHORT TERM GOAL #1   Title Pt will initiate HEP in order to indicate improved functional mobility and decreased fall risk.  (Target Date: 03/17/16)   Baseline Met 9/27.   Status Achieved     PT SHORT TERM GOAL #2   Title Pt will perform stand pivot transfer w/ LRAD at S level in order to increase independence at home.     Baseline 03/10/16: pt continues to need up to min assist at times for balance.   Status Not Met     PT SHORT TERM GOAL #3   Title Pt will perform bed mobility at S level in order to indicate improved functional independence.     Baseline Met 9/27.   Time 4   Period Weeks   Status Achieved     PT SHORT TERM GOAL #4   Title Pt will ambulate w/ LRAD x 150' at min A level in order to indicate improved household ambulation.     Baseline 03/10/16: pt unable to meet this distance and continues to need varied assistance, min to max, depending on fatigue level.   Status Not Met     PT SHORT TERM GOAL #5   Title Pt will perform PASS and improve score 3 points in order to indicate improved functional mobility.     Baseline 03/10/16: no change in score from 1st assessment, continues to be 25.   Status Not Met           PT Long Term Goals - 02/18/16  1149      PT LONG TERM GOAL #1   Title Pt/spouse will be independent with HEP in order to indicate improved functional mobility and decreased fall risk.  (Target Date: 04/14/16)   Time 8   Period Weeks   Status New     PT LONG TERM GOAL #2   Title Pt will perform stand pivot transfers w/ LRAD at mod I level in order to indicate improved functional independence.    Time 8   Period Weeks   Status New     PT LONG TERM GOAL #3  Title Pt will perform bed mobility at mod I level in order to indicate improved functional mobility.     Time 8   Period Weeks   Status New     PT LONG TERM GOAL #4   Title Pt will ambulate x 150' w/ LRAD at mod I level over indoor surfaces to indicate increased independence with household ambulation.     Time 8   Period Weeks   Status New     PT LONG TERM GOAL #5   Title Pt will ambulate over paved outdoor surfaces w/ LRAD x 200' at S level in order to indicate initiation of community ambulation.    Time 8   Period Weeks   Status New               Plan - 03/28/16 1250    Clinical Impression Statement Skilled session focused on sit<>stand with use of mirror for adequate forward weight shift, equal WB, midline orientation and decreased use of person assisting.  Also continue to work on gait with L PFRW, order received during session and adjusted to pts personal walker so they can begin to practice at home.    Rehab Potential Excellent   Clinical Impairments Affecting Rehab Potential Pt very motivated and able to attend therapy 3x/wk   PT Frequency 3x / week  then 2x/week for 4 weeks   PT Duration 4 weeks  then 2x/week for 4 weeks   PT Treatment/Interventions ADLs/Self Care Home Management;Canalith Repostioning;Electrical Stimulation;DME Instruction;Stair training;Gait training;Functional mobility training;Therapeutic activities;Therapeutic exercise;Balance training;Neuromuscular re-education;Patient/family education;Orthotic Fit/Training;Passive  range of motion;Energy conservation;Taping;Vestibular;Visual/perceptual remediation/compensation   PT Next Visit Plan Gait training with L PFRW. Continue to focus on grading/control of lateral weight shift during standing and stand pivot transfers.   Consulted and Agree with Plan of Care Patient;Family member/caregiver   Family Member Consulted Husband [name]      Patient will benefit from skilled therapeutic intervention in order to improve the following deficits and impairments:  Abnormal gait, Decreased activity tolerance, Decreased balance, Decreased coordination, Decreased endurance, Decreased knowledge of use of DME, Decreased mobility, Decreased strength, Dizziness, Impaired perceived functional ability, Impaired flexibility, Impaired tone, Impaired UE functional use, Impaired vision/preception, Improper body mechanics, Postural dysfunction  Visit Diagnosis: Hemiplegia and hemiparesis following cerebral infarction affecting left non-dominant side (HCC)  Other abnormalities of gait and mobility  Unsteadiness on feet  Abnormal posture     Problem List Patient Active Problem List   Diagnosis Date Noted  . 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 TIA (transient ischemic attack) 11/07/2014  . Hypertension 11/07/2014  . DM II (diabetes mellitus, type II), controlled ([place]) 11/07/2014    Cameron Sprang, PT, MPT [facility] [address] [place], [place], [address] Phone: [phone]   Fax:  [phone] 03/28/16, 1:04 PM  Name: MIROSLAVA SANTELLAN MRN: [id number] Date of Birth: 02/21/1956

## 2016-03-28 NOTE — Therapy (Signed)
[facility] [address] [place], [place], [address] Phone: [phone]   Fax:  [phone]  Occupational Therapy Treatment  Patient Details  Name: Shelly Mcdaniel MRN: [id number] Date of Birth: 1956-04-25 Referring Provider: Dr. Tobie LordsLee Shuping  Encounter Date: 03/28/2016      OT End of Session - 03/28/16 1353    Visit Number 15   Number of Visits 24   Date for OT Re-Evaluation 04/25/16   Authorization Type BC/BS   OT Start Time 1100   OT Stop Time 1145   OT Time Calculation (min) 45 min   Activity Tolerance Patient tolerated treatment well   Behavior [facility] tasks assessed/performed      Past Medical History:  Diagnosis Date  . Diabetes mellitus without complication (HCC)   . Hyperlipidemia   . Hypertension     Past Surgical History:  Procedure Laterality Date  . BACK SURGERY    . BREAST SURGERY    . COLONOSCOPY WITH PROPOFOL N/A 12/24/2013   Procedure: COLONOSCOPY WITH PROPOFOL;  Surgeon: Charolett BumpersMartin K Johnson, MD;  Location: [facility];  Service: Endoscopy;  Laterality: N/A;    There were no vitals filed for this visit.      Subjective Assessment - 03/28/16 1343    Subjective  Patient reports that left hand orthosis made sleep difficult on two nights, so she removed splint   Patient is accompained by: Family member   Limitations vestibular deficits (central and peripheral deficits), fall risk, Lt AFO, sh. subluxation   Patient Stated Goals I want my hand to come back   Currently in Pain? No/denies   Pain Score 2    Pain Location Shoulder   Pain Orientation Left   Pain Descriptors / Indicators Aching   Pain Type Chronic pain   Pain Onset 1 to 4 weeks ago   Pain Frequency Intermittent   Aggravating Factors  overstretch   Pain Relieving Factors meds, positioning   Multiple Pain Sites No                      OT Treatments/Exercises (OP) - 03/28/16 1346      Neurological Re-education Exercises   Other Exercises 1 Neuromuscular reeducation to address postural control for improved alignment and activation in left upper extremity.  Patient with strong right bias in sitting, and then attempts to even passively move left arm are perceived as painful.  When patient faciltated to sit in midline, or even slightly to left, patient with no report of shoulder pain.  patient is experiencing stretch to bicep as pain in muscle belly.  In supine worked to prepare shoulder joint and passively move through pain free ranges.  Patient with incorrect recruitment with every attempt initially to move left shoulder - overactivating trunk flexion, and holding breath.  In supported position, better able to control these ineffective attempts to activate shoulder muscles.  Then able to activate in mid range shoulder flex/ext, abd/add, IR/ER.      Other Exercises 2 In sitting worked to realign carpals in preparation for weight bearing through forearm and then heel of left hand.  Patient needed mod facilitation to avoid drifting to right during this exercise.  When shoulder supported and aligned, able to accept weight through heel of hand and isoalte shoulder from trunk motion.                  OT Education - 03/28/16 1353    Education  provided Yes   Education Details Importance of head/neck/  trunk alignment to reduce shoulder discomfort   Person(s) Educated Patient   Methods Explanation;Demonstration   Comprehension Need further instruction          OT Short Term Goals - 03/15/16 1135      OT SHORT TERM GOAL #1   Title Pt/family independent with initial HEP for LUE (All STG's due 03/26/16)    Status On-going     OT SHORT TERM GOAL #2   Title Pt/family to verbalize understanding with proper positioning of LUE during activity and in bed to prevent pain including use of sling prn   Status Achieved     OT SHORT TERM GOAL #3   Title Pt to perform UE dressing with  min assist using hemi techniques   Status Achieved  03/14/16  min cueing     OT SHORT TERM GOAL #4   Title Pt to perform functional stand pivot transfer for toileting with no more than mod assist    Status Achieved  Currently min assist     OT SHORT TERM GOAL #5   Title Pt to be mod assist with LE dressing using hemi techniques and A/E PRN   Status Achieved  min a           OT Long Term Goals - 03/15/16 1136      OT LONG TERM GOAL #1   Title Independent with updated HEP prn (all LTG's due 04/25/16)    Status On-going     OT LONG TERM GOAL #2   Title Pt to demo active low range sh. flexion to 25 degrees in prep for reaching and use of arm as stabilizer   Status On-going     OT LONG TERM GOAL #3   Title Pt to demo 50% gross finger flexion/extension to grasp/release cylindrical objects   Status On-going     OT LONG TERM GOAL #4   Title Pt to be supervision level with all BADLS   Status On-going     OT LONG TERM GOAL #5   Title Vision goal TBD    Status New               Plan - 03/28/16 1354    Clinical Impression Statement Patient continues to show improvement in her ability to activate left arm muscles at shoulder, elbow, forearm, and wrist.  Patient with increased tension in bicep which at times is leading to painful stretch   Rehab Potential Good   OT Frequency 3x / week   OT Duration 8 weeks   OT Treatment/Interventions Self-care/ADL training;Moist Heat;DME and/or AE instruction;Splinting;Patient/family education;Therapeutic exercises;Compression bandaging;Therapeutic activities;Neuromuscular education;Functional Mobility Training;Passive range of motion;Cognitive remediation/compensation;Electrical Stimulation;Manual Therapy;Dry needling;Visual/perceptual remediation/compensation   Plan NMR LUE, Postural control   Consulted and Agree with Plan of Care Patient;Family member/caregiver   Family Member Consulted Husband - [name]      Patient will benefit from  skilled therapeutic intervention in order to improve the following deficits and impairments:  Decreased activity tolerance, Decreased balance, Decreased cognition, Decreased knowledge of use of DME, Decreased coordination, Decreased endurance, Decreased range of motion, Decreased safety awareness, Decreased skin integrity, Decreased strength, Impaired perceived functional ability, Increased edema, Impaired sensation, Impaired tone, Impaired UE functional use, Impaired vision/preception, Pain, Improper body mechanics  Visit Diagnosis: Hemiplegia and hemiparesis following cerebral infarction affecting left non-dominant side (HCC)  Unsteadiness on feet  Abnormal posture  Acute pain of left shoulder  Other lack of coordination  Visuospatial deficit    Problem List Patient Active Problem List   Diagnosis Date Noted  . 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 TIA (transient ischemic attack) 11/07/2014  . Hypertension 11/07/2014  . DM II (diabetes mellitus, type II), controlled (HCC) 11/07/2014    Collier Salina 03/28/2016, 1:57 PM  [facility] [address] [place], [place], [address] Phone: [phone]   Fax:  [phone]  Name: Shelly Mcdaniel MRN: [id number] Date of Birth: 09/21/1955

## 2016-03-29 ENCOUNTER — Encounter: Payer: BC Managed Care – PPO | Admitting: Occupational Therapy

## 2016-03-29 ENCOUNTER — Ambulatory Visit: Payer: BC Managed Care – PPO | Admitting: Physical Therapy

## 2016-03-31 ENCOUNTER — Encounter: Payer: Self-pay | Admitting: Rehabilitation

## 2016-03-31 ENCOUNTER — Ambulatory Visit: Payer: BC Managed Care – PPO | Admitting: Rehabilitation

## 2016-03-31 ENCOUNTER — Encounter: Payer: BC Managed Care – PPO | Admitting: Occupational Therapy

## 2016-03-31 DIAGNOSIS — I69354 Hemiplegia and hemiparesis following cerebral infarction affecting left non-dominant side: Secondary | ICD-10-CM | POA: Diagnosis not present

## 2016-03-31 DIAGNOSIS — R42 Dizziness and giddiness: Secondary | ICD-10-CM

## 2016-03-31 DIAGNOSIS — R293 Abnormal posture: Secondary | ICD-10-CM

## 2016-03-31 DIAGNOSIS — R2681 Unsteadiness on feet: Secondary | ICD-10-CM

## 2016-03-31 NOTE — Therapy (Signed)
[facility] [address] [place], [place], [address] Phone: [phone]   Fax:  [phone]  Physical Therapy Treatment  Patient Details  Name: Shelly Mcdaniel MRN: [id number] Date of Birth: 12-Dec-1955 Referring Provider: Juanda Bond, MD  Encounter Date: 03/31/2016      PT End of Session - 03/31/16 1632    Visit Number 18   Number of Visits 21   Date for PT Re-Evaluation 04/18/16   Authorization Type BC[employer] 0 visit limit, 0 auth   PT Start Time 1018   PT Stop Time 1102   PT Time Calculation (min) 44 min   Equipment Utilized During Treatment Gait belt   Activity Tolerance Other (comment)  Limited by nausea   Behavior During Therapy WFL for tasks assessed/performed      Past Medical History:  Diagnosis Date  . Diabetes mellitus without complication ([place])   . Hyperlipidemia   . Hypertension     Past Surgical History:  Procedure Laterality Date  . BACK SURGERY    . BREAST SURGERY    . COLONOSCOPY WITH PROPOFOL N/A 12/24/2013   Procedure: COLONOSCOPY WITH PROPOFOL;  Surgeon: [name], MD;  Location: [facility];  Service: Endoscopy;  Laterality: N/A;    There were no vitals filed for this visit.      Subjective Assessment - 03/31/16 1624    Subjective "My shoulder was hurting so bad yesterday.  I had to take Tramadol, Tylenol and Ibuprofen."   Limitations House hold activities;Walking   Currently in Pain? Yes   Pain Score 5    Pain Location Shoulder   Pain Orientation Left   Pain Descriptors / Indicators Aching   Pain Type Chronic pain   Pain Onset More than a month ago   Pain Frequency Intermittent   Aggravating Factors  malpositioning   Pain Relieving Factors repositioning                         [facility] Adult PT Treatment/Exercise - 03/31/16 1030      Transfers   Transfers Sit to Stand;Stand to Sit;Squat Pivot Transfers   Sit to Stand 4: Min assist   Sit to Stand  Details Verbal cues for sequencing;Verbal cues for technique;Verbal cues for precautions/safety;Manual facilitation for weight shifting;Manual facilitation for weight bearing   Sit to Stand Details (indicate cue type and reason) Continues to require cues for posture, forward weight shift, foot placement during sit<>stand   Stand to Sit 4: Min assist   Stand to Sit Details (indicate cue type and reason) Verbal cues for sequencing;Verbal cues for technique;Verbal cues for precautions/safety;Verbal cues for safe use of DME/AE;Manual facilitation for weight bearing;Manual facilitation for weight shifting   Squat Pivot Transfers 5: Supervision   Squat Pivot Transfer Details (indicate cue type and reason) Provided set up for LLE due to pt not having brace during session for safety of ankle.  Otherwise performed at S level.      Neuro Re-ed    Neuro Re-ed Details  Performed 2 bouts of tall kneeling during session in order to focus on postural control, improved L weight shift and forced WB on LLE.  While in tall kneeling, performed reaching task with RUE towards the left and superiorly to maintain upright posture as she tends to drop L hip with activity.  Continued heavy verbal and facilitation cues for posture.  During second bout, worked on stepping RLE out to the side and back  to midline for proximal stability of LLE.  Following 3-4 reps, pt suddenly stating LUE pain, therefore provided max verbal cues for posture as well as physical assist for posture for improvement.  Pt reports improvement, but then suddenly reports dizziness and feeling like she is going to pass out.  Despite verbal cues, pt needing assist back to mat.  Provided cold pack for relief with cues for visual fixation.  Pt then stating needing to lie down, therefore assisted and note increased dizziness.  Re-assessed for BPPV and note R horizontal canalithiasis.  Note she is avoiding rolling, depsite cues from PT.  Made pt daily chart to ensure she  is doing continued rolling at home for habituation.                  PT Education - 03/31/16 1631    Education provided Yes   Education Details MAX continued education on importance of head/neck/trunk alignment to reduce shoulder discomfort.    Person(s) Educated Patient   Methods Explanation;Demonstration   Comprehension Need further instruction          PT Short Term Goals - 03/10/16 1630      PT SHORT TERM GOAL #1   Title Pt will initiate HEP in order to indicate improved functional mobility and decreased fall risk.  (Target Date: 03/17/16)   Baseline Met 9/27.   Status Achieved     PT SHORT TERM GOAL #2   Title Pt will perform stand pivot transfer w/ LRAD at S level in order to increase independence at home.     Baseline 03/10/16: pt continues to need up to min assist at times for balance.   Status Not Met     PT SHORT TERM GOAL #3   Title Pt will perform bed mobility at S level in order to indicate improved functional independence.     Baseline Met 9/27.   Time 4   Period Weeks   Status Achieved     PT SHORT TERM GOAL #4   Title Pt will ambulate w/ LRAD x 150' at min A level in order to indicate improved household ambulation.     Baseline 03/10/16: pt unable to meet this distance and continues to need varied assistance, min to max, depending on fatigue level.   Status Not Met     PT SHORT TERM GOAL #5   Title Pt will perform PASS and improve score 3 points in order to indicate improved functional mobility.     Baseline 03/10/16: no change in score from 1st assessment, continues to be 25.   Status Not Met           PT Long Term Goals - 02/18/16 1149      PT LONG TERM GOAL #1   Title Pt/spouse will be independent with HEP in order to indicate improved functional mobility and decreased fall risk.  (Target Date: 04/14/16)   Time 8   Period Weeks   Status New     PT LONG TERM GOAL #2   Title Pt will perform stand pivot transfers w/ LRAD at mod I level in  order to indicate improved functional independence.    Time 8   Period Weeks   Status New     PT LONG TERM GOAL #3   Title Pt will perform bed mobility at mod I level in order to indicate improved functional mobility.     Time 8   Period Weeks   Status New  PT LONG TERM GOAL #4   Title Pt will ambulate x 150' w/ LRAD at mod I level over indoor surfaces to indicate increased independence with household ambulation.     Time 8   Period Weeks   Status New     PT LONG TERM GOAL #5   Title Pt will ambulate over paved outdoor surfaces w/ LRAD x 200' at S level in order to indicate initiation of community ambulation.    Time 8   Period Weeks   Status New               Plan - 03/31/16 1632    Clinical Impression Statement Skilled session focused on NMR of LLE and postural control working in tall kneeling position to do so.  Note increased LUE pain during session, therefore continue to educate on improved head, shoulder and trunk alignment to decrease pain.  Also note pt with R horizontal canalithiasis which PT provided education on rolling for habituation and a graph to ensure compliance at home.    Rehab Potential Excellent   Clinical Impairments Affecting Rehab Potential Pt very motivated and able to attend therapy 3x/wk   PT Frequency 3x / week  then 2x/week for 4 weeks   PT Duration 4 weeks  then 2x/week for 4 weeks   PT Treatment/Interventions ADLs/Self Care Home Management;Canalith Repostioning;Electrical Stimulation;DME Instruction;Stair training;Gait training;Functional mobility training;Therapeutic activities;Therapeutic exercise;Balance training;Neuromuscular re-education;Patient/family education;Orthotic Fit/Training;Passive range of motion;Energy conservation;Taping;Vestibular;Visual/perceptual remediation/compensation   PT Next Visit Plan Gait training with L PFRW. Continue to focus on grading/control of lateral weight shift during standing and stand pivot transfers.    Consulted and Agree with Plan of Care Patient      Patient will benefit from skilled therapeutic intervention in order to improve the following deficits and impairments:  Abnormal gait, Decreased activity tolerance, Decreased balance, Decreased coordination, Decreased endurance, Decreased knowledge of use of DME, Decreased mobility, Decreased strength, Dizziness, Impaired perceived functional ability, Impaired flexibility, Impaired tone, Impaired UE functional use, Impaired vision/preception, Improper body mechanics, Postural dysfunction  Visit Diagnosis: Hemiplegia and hemiparesis following cerebral infarction affecting left non-dominant side (HCC)  Unsteadiness on feet  Abnormal posture  Dizziness and giddiness     Problem List Patient Active Problem List   Diagnosis Date Noted  . 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 TIA (transient ischemic attack) 11/07/2014  . Hypertension 11/07/2014  . DM II (diabetes mellitus, type II), controlled ([place]) 11/07/2014    Cameron Sprang, PT, MPT [facility] [address] [place], [place], [address] Phone: [phone]   Fax:  [phone] 03/31/16, 4:36 PM  Name: MARYANNA STUBER MRN: [id number] Date of Birth: 1955/10/21

## 2016-04-01 ENCOUNTER — Ambulatory Visit: Payer: BC Managed Care – PPO

## 2016-04-01 ENCOUNTER — Encounter: Payer: Self-pay | Admitting: Occupational Therapy

## 2016-04-01 ENCOUNTER — Ambulatory Visit: Payer: BC Managed Care – PPO | Admitting: Occupational Therapy

## 2016-04-01 DIAGNOSIS — R278 Other lack of coordination: Secondary | ICD-10-CM

## 2016-04-01 DIAGNOSIS — R41842 Visuospatial deficit: Secondary | ICD-10-CM

## 2016-04-01 DIAGNOSIS — R293 Abnormal posture: Secondary | ICD-10-CM

## 2016-04-01 DIAGNOSIS — R2681 Unsteadiness on feet: Secondary | ICD-10-CM

## 2016-04-01 DIAGNOSIS — R471 Dysarthria and anarthria: Secondary | ICD-10-CM

## 2016-04-01 DIAGNOSIS — M25512 Pain in left shoulder: Secondary | ICD-10-CM

## 2016-04-01 DIAGNOSIS — I69354 Hemiplegia and hemiparesis following cerebral infarction affecting left non-dominant side: Secondary | ICD-10-CM

## 2016-04-01 NOTE — Therapy (Signed)
[facility] [address] [place], [place], [address] Phone: [phone]   Fax:  [phone]  Occupational Therapy Treatment  Patient Details  Name: Shelly Mcdaniel MRN: [id number] Date of Birth: [birth year] Referring Provider: Dr. Tobie Lords  Encounter Date: 04/01/2016      OT End of Session - 04/01/16 1043    Visit Number 16   Number of Visits 24   Date for OT Re-Evaluation 04/25/16   Authorization Type BC/BS   OT Start Time 0933   OT Stop Time 1020   OT Time Calculation (min) 47 min   Activity Tolerance Patient tolerated treatment well   Behavior During Therapy [facility] for tasks assessed/performed      Past Medical History:  Diagnosis Date  . Diabetes mellitus without complication (HCC)   . Hyperlipidemia   . Hypertension     Past Surgical History:  Procedure Laterality Date  . BACK SURGERY    . BREAST SURGERY    . COLONOSCOPY WITH PROPOFOL N/A 12/24/2013   Procedure: COLONOSCOPY WITH PROPOFOL;  Surgeon: Charolett Bumpers, MD;  Location: [facility];  Service: Endoscopy;  Laterality: N/A;    There were no vitals filed for this visit.      Subjective Assessment - 04/01/16 1034    Subjective  I moved my thumb this morning!   Patient is accompained by: Family member   Limitations vestibular deficits (central and peripheral deficits), fall risk, Lt AFO, sh. subluxation   Patient Stated Goals I want my hand to come back   Currently in Pain? Yes   Pain Score 3    Pain Location Shoulder   Pain Orientation Left   Pain Descriptors / Indicators Aching   Pain Type Chronic pain   Pain Onset More than a month ago   Pain Frequency Intermittent   Aggravating Factors  malalignment   Pain Relieving Factors repositioning, support   Multiple Pain Sites No                      OT Treatments/Exercises (OP) - 04/01/16 0001      Neurological Re-education Exercises   Other Exercises 1 Neuromuscular  reeducation to first provide manual intervention to align glenohumeral joint and prepare joint capsule, specifically posterior capsule.  Worked to increase isolated control in shoulder - flexion, extension, abd/adduction, and rotation while supine to limit postural adjustments - then worked to facilitate in sitting.  In sitting with weight strongly biased toward active left hip/trunk, patient able to activate shoulder in all directions without report of pain.  In sidelying worked on isolated elbow, wrist, and digit control.  Patient now with active elbow flexion and extension following facilitation, also with digit extension (MCP/PIP.)  Patient able to complete lateral pinch contact of thumb to index finger, and replicate movement multiple times as needed for prehensile control.                  OT Education - 04/01/16 1042    Education provided Yes   Education Details Discussed importance of managing left arm while completing bed mobility to help control pain.  Patient instructed to support left arm so elbow rested slightly in front of body (not behind body) for optimal comfort while supine   Person(s) Educated Patient   Methods Explanation;Demonstration   Comprehension Verbalized understanding;Need further instruction          OT Short Term Goals - 03/15/16 1135      OT  SHORT TERM GOAL #1   Title Pt/family independent with initial HEP for LUE (All STG's due 03/26/16)    Status On-going     OT SHORT TERM GOAL #2   Title Pt/family to verbalize understanding with proper positioning of LUE during activity and in bed to prevent pain including use of sling prn   Status Achieved     OT SHORT TERM GOAL #3   Title Pt to perform UE dressing with min assist using hemi techniques   Status Achieved  03/14/16  min cueing     OT SHORT TERM GOAL #4   Title Pt to perform functional stand pivot transfer for toileting with no more than mod assist    Status Achieved  Currently min assist      OT SHORT TERM GOAL #5   Title Pt to be mod assist with LE dressing using hemi techniques and A/E PRN   Status Achieved  min a           OT Long Term Goals - 03/15/16 1136      OT LONG TERM GOAL #1   Title Independent with updated HEP prn (all LTG's due 04/25/16)    Status On-going     OT LONG TERM GOAL #2   Title Pt to demo active low range sh. flexion to 25 degrees in prep for reaching and use of arm as stabilizer   Status On-going     OT LONG TERM GOAL #3   Title Pt to demo 50% gross finger flexion/extension to grasp/release cylindrical objects   Status On-going     OT LONG TERM GOAL #4   Title Pt to be supervision level with all BADLS   Status On-going     OT LONG TERM GOAL #5   Title Vision goal TBD    Status New               Plan - 04/01/16 1044    Clinical Impression Statement Patient with continued steady progress in isolated left arm movement.  Patient needs additional work to improve postural control and balance.     Rehab Potential Good   OT Frequency 3x / week   OT Duration 8 weeks   OT Treatment/Interventions Self-care/ADL training;Moist Heat;DME and/or AE instruction;Splinting;Patient/family education;Therapeutic exercises;Compression bandaging;Therapeutic activities;Neuromuscular education;Functional Mobility Training;Passive range of motion;Cognitive remediation/compensation;Electrical Stimulation;Manual Therapy;Dry needling;Visual/perceptual remediation/compensation   Plan NMR LUE, postural control   Consulted and Agree with Plan of Care Patient;Family member/caregiver   Family Member Consulted Husband - [name]      Patient will benefit from skilled therapeutic intervention in order to improve the following deficits and impairments:  Decreased activity tolerance, Decreased balance, Decreased cognition, Decreased knowledge of use of DME, Decreased coordination, Decreased endurance, Decreased range of motion, Decreased safety awareness, Decreased skin  integrity, Decreased strength, Impaired perceived functional ability, Increased edema, Impaired sensation, Impaired tone, Impaired UE functional use, Impaired vision/preception, Pain, Improper body mechanics  Visit Diagnosis: Hemiplegia and hemiparesis following cerebral infarction affecting left non-dominant side (HCC)  Unsteadiness on feet  Abnormal posture  Acute pain of left shoulder  Other lack of coordination  Visuospatial deficit    Problem List Patient Active Problem List   Diagnosis Date Noted  . 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 TIA (transient ischemic attack) 11/07/2014  . Hypertension 11/07/2014  . DM II (diabetes mellitus, type II), controlled (HCC) 11/07/2014    Collier SalinaGellert, Kortez Murtagh M 04/01/2016, 10:46 AM  [facility] [address] [place],  [place], [address] Phone: [phone]   Fax:  [phone]  Name: ORI KREITER MRN: [id number] Date of Birth: 01/19/1956

## 2016-04-01 NOTE — Therapy (Signed)
[facility] [address] [place], [place], [address] Phone: [phone]   Fax:  [phone]  Speech Language Pathology Treatment  Patient Details  Name: Shelly Mcdaniel MRN: [id number] Date of Birth: [birth year] Referring Provider: Austin Miles  Encounter Date: 04/01/2016      End of Session - 04/01/16 1143    Visit Number 3   Number of Visits 17   Date for SLP Re-Evaluation 06/03/16   SLP Start Time 1104   SLP Stop Time  1145   SLP Time Calculation (min) 41 min   Activity Tolerance Patient tolerated treatment well      Past Medical History:  Diagnosis Date  . Diabetes mellitus without complication (HCC)   . Hyperlipidemia   . Hypertension     Past Surgical History:  Procedure Laterality Date  . BACK SURGERY    . BREAST SURGERY    . COLONOSCOPY WITH PROPOFOL N/A 12/24/2013   Procedure: COLONOSCOPY WITH PROPOFOL;  Surgeon: Charolett Bumpers, MD;  Location: [facility];  Service: Endoscopy;  Laterality: N/A;    There were no vitals filed for this visit.      Subjective Assessment - 04/01/16 1109    Subjective Pt states she is not having incr'd hoarseness from the HEP. Pt's ENT eval is Monday 04-11-16. Pt got four hours sleep last night.   Patient is accompained by: Family member  husband   Currently in Pain? Yes   Pain Score 2    Pain Location Shoulder   Pain Orientation Left   Pain Descriptors / Indicators Aching   Pain Type Chronic pain   Pain Onset More than a month ago   Pain Frequency Intermittent               ADULT SLP TREATMENT - 04/01/16 1112      General Information   Behavior/Cognition Alert;Cooperative;Pleasant mood   Patient Positioning Upright in chair     Cognitive-Linquistic Treatment   Treatment focused on Voice   Skilled Treatment Pt reports she uses confidential voice until she gets a cue to incr her speech volume. Pt also using vocal "time outs" during the  day and putting more thought into voice rest. She has been regularly performing voice adduction exercises. SLP explained procedure of laryngoscopy with stroboscopy to pt, as she reported she was slightly anxious last time she was scoped via nares. Pt performed HEP and benefitted from occasional verbal and demo cues on supraglottic and super-supraglottic swallows. Pt noted to have some difficulty with sequencing vocal adduction exercises.     Assessment / Recommendations / Plan   Plan Continue with current plan of care     Progression Toward Goals   Progression toward goals Progressing toward goals            SLP Short Term Goals - 04/01/16 1145      SLP SHORT TERM GOAL #1   Title pt will perform HEP with rare min A over three sessions   Time 3   Period Weeks   Status On-going     SLP SHORT TERM GOAL #2   Title pt will improve max phonation time to average 15.5 seconds   Time 3   Period Weeks   Status On-going          SLP Long Term Goals - 04/01/16 1146      SLP LONG TERM GOAL #1   Title pt will complete HEP with independence over two sessions  Time 7   Period Weeks   Status On-going     SLP LONG TERM GOAL #2   Title pt's max phonation time will improve to 16.5 seconds average   Time 7   Period Weeks   Status On-going          Plan - 04/01/16 1143    Clinical Impression Statement 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 SLP recommends cont'd skilled ST to address vocal deficits. She is to have ENT eval at [facility] 04-11-16. SLP may need to add cognitive goals at a later date, after cognitive eval, and pt agrees to focus first on voice and then cognition, if necessary. Pt/husband/SLP agree that next week's ST appointment can be canceled and rescheduled after the ENT eval.   Speech Therapy Frequency 2x / week   Duration --  7 weeks   Treatment/Interventions Patient/family education;Compensatory  techniques;Internal/external aids;SLP instruction and feedback   Potential to Achieve Goals Good   Consulted and Agree with Plan of Care Patient;Family member/caregiver   Family Member Consulted husband      Patient will benefit from skilled therapeutic intervention in order to improve the following deficits and impairments:   Dysarthria and anarthria    Problem List Patient Active Problem List   Diagnosis Date Noted  . 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 TIA (transient ischemic attack) 11/07/2014  . Hypertension 11/07/2014  . DM II (diabetes mellitus, type II), controlled (HCC) 11/07/2014    Lynette Topete ,MS, CCC-SLP  04/01/2016, 11:48 AM  [facility] [address] [place], [place], [address] Phone: [phone]   Fax:  [phone]   Name: DESTINIE THORNSBERRY MRN: [id number] Date of Birth: 06-Nov-1955

## 2016-04-01 NOTE — Patient Instructions (Signed)
Since you have your ENT eval a week from Monday, let's cancel next week's appointment. Maralyn SagoSarah may suggest some other exercises based upon the results of the evaluation. That is fine! Just bring them in next session and we can work with them.

## 2016-04-04 ENCOUNTER — Ambulatory Visit: Payer: BC Managed Care – PPO | Admitting: Rehabilitation

## 2016-04-04 ENCOUNTER — Encounter: Payer: Self-pay | Admitting: Occupational Therapy

## 2016-04-04 ENCOUNTER — Ambulatory Visit: Payer: BC Managed Care – PPO | Admitting: Occupational Therapy

## 2016-04-04 ENCOUNTER — Encounter: Payer: BC Managed Care – PPO | Admitting: Occupational Therapy

## 2016-04-04 VITALS — BP 147/104 | HR 107

## 2016-04-04 DIAGNOSIS — I69354 Hemiplegia and hemiparesis following cerebral infarction affecting left non-dominant side: Secondary | ICD-10-CM

## 2016-04-04 NOTE — Therapy (Signed)
[facility] [address] [place], [place], [address] Phone: [phone]   Fax:  [phone]  Occupational Therapy Treatment  Patient Details  Name: Shelly Mcdaniel MRN: [id number] Date of Birth: Dec 30, 1955 Referring Provider: Dr. Tobie LordsLee Shuping  Encounter Date: 04/04/2016      OT End of Session - 04/04/16 1513    Visit Number 16   Number of Visits 24   Date for OT Re-Evaluation 04/25/16   Authorization Type BC/BS      Past Medical History:  Diagnosis Date  . Diabetes mellitus without complication (HCC)   . Hyperlipidemia   . Hypertension     Past Surgical History:  Procedure Laterality Date  . BACK SURGERY    . BREAST SURGERY    . COLONOSCOPY WITH PROPOFOL N/A 12/24/2013   Procedure: COLONOSCOPY WITH PROPOFOL;  Surgeon: Charolett BumpersMartin K Johnson, MD;  Location: [facility];  Service: Endoscopy;  Laterality: N/A;    Vitals:   04/04/16 1453  BP: (!) 147/104  Pulse: (!) 107        Subjective Assessment - 04/04/16 1453    Patient is accompained by: Family member  husband   Limitations vestibular deficits (central and peripheral deficits), fall risk, Lt AFO, sh. subluxation   Patient Stated Goals I want my hand to come back   Currently in Pain? Yes   Pain Score 6    Pain Location Shoulder   Pain Orientation Left   Pain Descriptors / Indicators Aching   Pain Type Chronic pain   Pain Onset More than a month ago   Pain Frequency Intermittent   Aggravating Factors  malalignment   Pain Relieving Factors repositioning, support   Multiple Pain Sites No                                OT Short Term Goals - 04/04/16 1509      OT SHORT TERM GOAL #1   Title Pt/family independent with initial HEP for LUE (All STG's due 03/26/16)    Status On-going     OT SHORT TERM GOAL #2   Title Pt/family to verbalize understanding with proper positioning of LUE during activity and in bed to prevent pain  including use of sling prn   Status Achieved     OT SHORT TERM GOAL #3   Title Pt to perform UE dressing with min assist using hemi techniques   Status Achieved  03/14/16  min cueing     OT SHORT TERM GOAL #4   Title Pt to perform functional stand pivot transfer for toileting with no more than mod assist    Status Achieved  Currently min assist     OT SHORT TERM GOAL #5   Title Pt to be mod assist with LE dressing using hemi techniques and A/E PRN   Status Achieved  min a           OT Long Term Goals - 04/04/16 1509      OT LONG TERM GOAL #1   Title Independent with updated HEP prn (all LTG's due 04/25/16)    Status On-going     OT LONG TERM GOAL #2   Title Pt to demo active low range sh. flexion to 25 degrees in prep for reaching and use of arm as stabilizer   Status On-going     OT LONG TERM GOAL #3   Title Pt to demo 50%  gross finger flexion/extension to grasp/release cylindrical objects   Status On-going     OT LONG TERM GOAL #4   Title Pt to be supervision level with all BADLS   Status On-going     OT LONG TERM GOAL #5   Title Vision goal TBD    Status New               Plan - 04/04/16 1509    Clinical Impression Statement Pt arrived today and husband reported pt was very confused yesterday and BP and blood sugar were high. Pt's BP today was 147/104, pulse 107 resting.  Waitied five minutes and took manual BP - 148/103.  Session terminated and husband reported he already had placed a phone call to MD  Pt and husband aware that we are unable to treat when BP diastolic number is over 100.  Pt and husband awaiting call back from MD and given vitals  in writing.     Rehab Potential Good   OT Frequency 3x / week   OT Duration 8 weeks   OT Treatment/Interventions Self-care/ADL training;Moist Heat;DME and/or AE instruction;Splinting;Patient/family education;Therapeutic exercises;Compression bandaging;Therapeutic activities;Neuromuscular education;Functional  Mobility Training;Passive range of motion;Cognitive remediation/compensation;Electrical Stimulation;Manual Therapy;Dry needling;Visual/perceptual remediation/compensation   Plan CHECK BP!!!, NMR for LUE, postural control   Consulted and Agree with Plan of Care Patient;Family member/caregiver   Family Member Consulted Husband - [name]      Patient will benefit from skilled therapeutic intervention in order to improve the following deficits and impairments:  Decreased activity tolerance, Decreased balance, Decreased cognition, Decreased knowledge of use of DME, Decreased coordination, Decreased endurance, Decreased range of motion, Decreased safety awareness, Decreased skin integrity, Decreased strength, Impaired perceived functional ability, Increased edema, Impaired sensation, Impaired tone, Impaired UE functional use, Impaired vision/preception, Pain, Improper body mechanics  Visit Diagnosis: Hemiplegia and hemiparesis following cerebral infarction affecting left non-dominant side [facility])    Problem List Patient Active Problem List   Diagnosis Date Noted  . 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 TIA (transient ischemic attack) 11/07/2014  . Hypertension 11/07/2014  . DM II (diabetes mellitus, type II), controlled (HCC) 11/07/2014    [name], OTR/L 04/04/2016, 3:13 PM  [facility] [address] [place], [place], [address] Phone: [phone]   Fax:  [phone]  Name: Shelly Mcdaniel MRN: [id number] Date of Birth: [birth year]

## 2016-04-05 ENCOUNTER — Encounter: Payer: BC Managed Care – PPO | Admitting: Occupational Therapy

## 2016-04-05 ENCOUNTER — Emergency Department (HOSPITAL_COMMUNITY): Payer: BC Managed Care – PPO

## 2016-04-05 ENCOUNTER — Encounter (HOSPITAL_COMMUNITY): Payer: Self-pay | Admitting: Neurology

## 2016-04-05 ENCOUNTER — Observation Stay (HOSPITAL_COMMUNITY): Payer: BC Managed Care – PPO

## 2016-04-05 ENCOUNTER — Ambulatory Visit: Payer: BC Managed Care – PPO | Admitting: Physical Therapy

## 2016-04-05 ENCOUNTER — Inpatient Hospital Stay (HOSPITAL_COMMUNITY)
Admission: EM | Admit: 2016-04-05 | Discharge: 2016-04-08 | DRG: 064 | Disposition: A | Discharge status: Rehabilitation Facility | Payer: BC Managed Care – PPO | Attending: Internal Medicine | Admitting: Internal Medicine

## 2016-04-05 DIAGNOSIS — I951 Orthostatic hypotension: Secondary | ICD-10-CM | POA: Diagnosis present

## 2016-04-05 DIAGNOSIS — R4182 Altered mental status, unspecified: Secondary | ICD-10-CM | POA: Diagnosis not present

## 2016-04-05 DIAGNOSIS — E1142 Type 2 diabetes mellitus with diabetic polyneuropathy: Secondary | ICD-10-CM | POA: Diagnosis present

## 2016-04-05 DIAGNOSIS — E7849 Other hyperlipidemia: Secondary | ICD-10-CM | POA: Diagnosis present

## 2016-04-05 DIAGNOSIS — E860 Dehydration: Secondary | ICD-10-CM | POA: Diagnosis present

## 2016-04-05 DIAGNOSIS — R471 Dysarthria and anarthria: Secondary | ICD-10-CM | POA: Diagnosis present

## 2016-04-05 DIAGNOSIS — Z79899 Other long term (current) drug therapy: Secondary | ICD-10-CM

## 2016-04-05 DIAGNOSIS — Z882 Allergy status to sulfonamides status: Secondary | ICD-10-CM

## 2016-04-05 DIAGNOSIS — R252 Cramp and spasm: Secondary | ICD-10-CM | POA: Diagnosis present

## 2016-04-05 DIAGNOSIS — G9340 Encephalopathy, unspecified: Secondary | ICD-10-CM | POA: Diagnosis present

## 2016-04-05 DIAGNOSIS — F329 Major depressive disorder, single episode, unspecified: Secondary | ICD-10-CM | POA: Diagnosis present

## 2016-04-05 DIAGNOSIS — E784 Other hyperlipidemia: Secondary | ICD-10-CM | POA: Diagnosis present

## 2016-04-05 DIAGNOSIS — Z794 Long term (current) use of insulin: Secondary | ICD-10-CM

## 2016-04-05 DIAGNOSIS — R0789 Other chest pain: Secondary | ICD-10-CM

## 2016-04-05 DIAGNOSIS — I6930 Unspecified sequelae of cerebral infarction: Secondary | ICD-10-CM

## 2016-04-05 DIAGNOSIS — I639 Cerebral infarction, unspecified: Secondary | ICD-10-CM | POA: Diagnosis not present

## 2016-04-05 DIAGNOSIS — Z91030 Bee allergy status: Secondary | ICD-10-CM

## 2016-04-05 DIAGNOSIS — Z823 Family history of stroke: Secondary | ICD-10-CM

## 2016-04-05 DIAGNOSIS — Z888 Allergy status to other drugs, medicaments and biological substances status: Secondary | ICD-10-CM

## 2016-04-05 DIAGNOSIS — I69334 Monoplegia of upper limb following cerebral infarction affecting left non-dominant side: Secondary | ICD-10-CM

## 2016-04-05 DIAGNOSIS — I6330 Cerebral infarction due to thrombosis of unspecified cerebral artery: Secondary | ICD-10-CM | POA: Diagnosis present

## 2016-04-05 DIAGNOSIS — I10 Essential (primary) hypertension: Secondary | ICD-10-CM | POA: Diagnosis not present

## 2016-04-05 DIAGNOSIS — I651 Occlusion and stenosis of basilar artery: Secondary | ICD-10-CM | POA: Diagnosis present

## 2016-04-05 DIAGNOSIS — E1121 Type 2 diabetes mellitus with diabetic nephropathy: Secondary | ICD-10-CM

## 2016-04-05 DIAGNOSIS — K5900 Constipation, unspecified: Secondary | ICD-10-CM | POA: Diagnosis present

## 2016-04-05 DIAGNOSIS — G8929 Other chronic pain: Secondary | ICD-10-CM | POA: Diagnosis present

## 2016-04-05 DIAGNOSIS — R000 Tachycardia, unspecified: Secondary | ICD-10-CM | POA: Diagnosis present

## 2016-04-05 DIAGNOSIS — E1169 Type 2 diabetes mellitus with other specified complication: Secondary | ICD-10-CM | POA: Diagnosis present

## 2016-04-05 DIAGNOSIS — G9349 Other encephalopathy: Secondary | ICD-10-CM | POA: Diagnosis present

## 2016-04-05 DIAGNOSIS — N39498 Other specified urinary incontinence: Secondary | ICD-10-CM | POA: Diagnosis present

## 2016-04-05 DIAGNOSIS — Z9181 History of falling: Secondary | ICD-10-CM

## 2016-04-05 DIAGNOSIS — Z885 Allergy status to narcotic agent status: Secondary | ICD-10-CM

## 2016-04-05 DIAGNOSIS — M62838 Other muscle spasm: Secondary | ICD-10-CM | POA: Diagnosis present

## 2016-04-05 DIAGNOSIS — Z7982 Long term (current) use of aspirin: Secondary | ICD-10-CM

## 2016-04-05 LAB — RAPID URINE DRUG SCREEN, HOSP PERFORMED
AMPHETAMINES: NOT DETECTED
BARBITURATES: NOT DETECTED
BENZODIAZEPINES: NOT DETECTED
COCAINE: NOT DETECTED
OPIATES: NOT DETECTED
TETRAHYDROCANNABINOL: NOT DETECTED

## 2016-04-05 LAB — URINALYSIS, ROUTINE W REFLEX MICROSCOPIC
BILIRUBIN URINE: NEGATIVE
GLUCOSE, UA: NEGATIVE mg/dL
HGB URINE DIPSTICK: NEGATIVE
KETONES UR: 15 mg/dL — AB
Nitrite: NEGATIVE
PH: 7 (ref 5.0–8.0)
PROTEIN: NEGATIVE mg/dL
Specific Gravity, Urine: 1.013 (ref 1.005–1.030)

## 2016-04-05 LAB — I-STAT CHEM 8, ED
BUN: 11 mg/dL (ref 6–20)
CREATININE: 0.4 mg/dL — AB (ref 0.44–1.00)
Calcium, Ion: 1.16 mmol/L (ref 1.15–1.40)
Chloride: 101 mmol/L (ref 101–111)
GLUCOSE: 151 mg/dL — AB (ref 65–99)
HCT: 40 % (ref 36.0–46.0)
HEMOGLOBIN: 13.6 g/dL (ref 12.0–15.0)
POTASSIUM: 3.9 mmol/L (ref 3.5–5.1)
Sodium: 140 mmol/L (ref 135–145)
TCO2: 26 mmol/L (ref 0–100)

## 2016-04-05 LAB — COMPREHENSIVE METABOLIC PANEL
ALK PHOS: 56 U/L (ref 38–126)
ALT: 15 U/L (ref 14–54)
AST: 15 U/L (ref 15–41)
Albumin: 4.1 g/dL (ref 3.5–5.0)
Anion gap: 11 (ref 5–15)
BUN: 8 mg/dL (ref 6–20)
CALCIUM: 10.2 mg/dL (ref 8.9–10.3)
CHLORIDE: 106 mmol/L (ref 101–111)
CO2: 25 mmol/L (ref 22–32)
CREATININE: 0.53 mg/dL (ref 0.44–1.00)
GFR calc non Af Amer: >60 mL/min (ref >60)
GLUCOSE: 137 mg/dL — AB (ref 65–99)
Potassium: 3.9 mmol/L (ref 3.5–5.1)
SODIUM: 142 mmol/L (ref 135–145)
Total Bilirubin: 0.8 mg/dL (ref 0.3–1.2)
Total Protein: 6.9 g/dL (ref 6.5–8.1)

## 2016-04-05 LAB — CBC WITH DIFFERENTIAL/PLATELET
Basophils Absolute: 0 10*3/uL (ref 0.0–0.1)
Basophils Relative: 1 %
EOS ABS: 0.1 10*3/uL (ref 0.0–0.7)
EOS PCT: 1 %
HCT: 39.4 % (ref 36.0–46.0)
Hemoglobin: 13.8 g/dL (ref 12.0–15.0)
LYMPHS ABS: 1.5 10*3/uL (ref 0.7–4.0)
LYMPHS PCT: 25 %
MCH: 30.5 pg (ref 26.0–34.0)
MCHC: 35 g/dL (ref 30.0–36.0)
MCV: 87 fL (ref 78.0–100.0)
MONO ABS: 0.4 10*3/uL (ref 0.1–1.0)
MONOS PCT: 7 %
Neutro Abs: 3.9 10*3/uL (ref 1.7–7.7)
Neutrophils Relative %: 66 %
PLATELETS: 192 10*3/uL (ref 150–400)
RBC: 4.53 MIL/uL (ref 3.87–5.11)
RDW: 13.5 % (ref 11.5–15.5)
WBC: 6 10*3/uL (ref 4.0–10.5)

## 2016-04-05 LAB — CBG MONITORING, ED
GLUCOSE-CAPILLARY: 126 mg/dL — AB (ref 65–99)
Glucose-Capillary: 140 mg/dL — ABNORMAL HIGH (ref 65–99)
Glucose-Capillary: 127 mg/dL — ABNORMAL HIGH (ref 65–99)

## 2016-04-05 LAB — ETHANOL

## 2016-04-05 LAB — GLUCOSE, CAPILLARY
GLUCOSE-CAPILLARY: 132 mg/dL — AB (ref 65–99)
Glucose-Capillary: 138 mg/dL — ABNORMAL HIGH (ref 65–99)

## 2016-04-05 LAB — PROTIME-INR
INR: 1.05
Prothrombin Time: 13.7 seconds (ref 11.4–15.2)

## 2016-04-05 LAB — URINE MICROSCOPIC-ADD ON: RBC / HPF: NONE SEEN RBC/hpf (ref 0–5)

## 2016-04-05 LAB — I-STAT TROPONIN, ED: Troponin i, poc: 0 ng/mL (ref 0.00–0.08)

## 2016-04-05 LAB — AMMONIA: Ammonia: 14 umol/L (ref 9–35)

## 2016-04-05 LAB — APTT: APTT: 27 s (ref 24–36)

## 2016-04-05 LAB — I-STAT CG4 LACTIC ACID, ED
LACTIC ACID, VENOUS: 1.35 mmol/L (ref 0.5–1.9)
Lactic Acid, Venous: 1.54 mmol/L (ref 0.5–1.9)

## 2016-04-05 LAB — TSH: TSH: 1.073 u[IU]/mL (ref 0.350–4.500)

## 2016-04-05 MED ORDER — FAMOTIDINE IN NACL 20-0.9 MG/50ML-% IV SOLN
20.0000 mg | Freq: Two times a day (BID) | INTRAVENOUS | Status: DC
  Administered 2016-04-05 – 2016-04-07 (×5): 20 mg via INTRAVENOUS
  Filled 2016-04-05 (×5): qty 50

## 2016-04-05 MED ORDER — INSULIN DETEMIR 100 UNIT/ML ~~LOC~~ SOLN
25.0000 [IU] | Freq: Every day | SUBCUTANEOUS | Status: DC
  Administered 2016-04-05 – 2016-04-07 (×3): 25 [IU] via SUBCUTANEOUS
  Filled 2016-04-05 (×4): qty 0.25

## 2016-04-05 MED ORDER — LABETALOL HCL 5 MG/ML IV SOLN: 20.0000 mg | INTRAVENOUS | Status: DC | PRN

## 2016-04-05 MED ORDER — KETOROLAC TROMETHAMINE 15 MG/ML IJ SOLN
15.0000 mg | Freq: Once | INTRAMUSCULAR | Status: AC
  Administered 2016-04-05: 15 mg via INTRAVENOUS
  Filled 2016-04-05: qty 1

## 2016-04-05 MED ORDER — LABETALOL HCL 5 MG/ML IV SOLN
10.0000 mg | INTRAVENOUS | Status: DC | PRN
  Administered 2016-04-05 – 2016-04-08 (×2): 10 mg via INTRAVENOUS
  Filled 2016-04-05: qty 4

## 2016-04-05 MED ORDER — PROMETHAZINE HCL 25 MG/ML IJ SOLN
12.5000 mg | INTRAMUSCULAR | Status: DC | PRN
  Administered 2016-04-05: 12.5 mg via INTRAVENOUS
  Filled 2016-04-05: qty 1

## 2016-04-05 MED ORDER — INSULIN ASPART 100 UNIT/ML ~~LOC~~ SOLN
0.0000 [IU] | SUBCUTANEOUS | Status: DC
  Administered 2016-04-05 – 2016-04-06 (×2): 2 [IU] via SUBCUTANEOUS
  Administered 2016-04-06 – 2016-04-07 (×4): 3 [IU] via SUBCUTANEOUS
  Administered 2016-04-07 – 2016-04-08 (×3): 2 [IU] via SUBCUTANEOUS

## 2016-04-05 MED ORDER — LIDOCAINE 5 % EX PTCH
1.0000 | MEDICATED_PATCH | CUTANEOUS | Status: DC
  Administered 2016-04-06 – 2016-04-07 (×2): 1 via TRANSDERMAL
  Filled 2016-04-05 (×3): qty 1

## 2016-04-05 MED ORDER — MORPHINE SULFATE (PF) 4 MG/ML IV SOLN: 1.0000 mg | INTRAVENOUS | Status: DC | PRN

## 2016-04-05 MED ORDER — SCOPOLAMINE 1 MG/3DAYS TD PT72
1.0000 | MEDICATED_PATCH | TRANSDERMAL | Status: DC
  Administered 2016-04-05: 1.5 mg via TRANSDERMAL
  Filled 2016-04-05 (×2): qty 1

## 2016-04-05 MED ORDER — METHOCARBAMOL 1000 MG/10ML IJ SOLN
500.0000 mg | Freq: Four times a day (QID) | INTRAVENOUS | Status: DC | PRN
  Administered 2016-04-08: 500 mg via INTRAVENOUS
  Filled 2016-04-05 (×3): qty 5

## 2016-04-05 MED ORDER — SODIUM CHLORIDE 0.9 % IV BOLUS (SEPSIS)
500.0000 mL | Freq: Once | INTRAVENOUS | Status: AC
  Administered 2016-04-05: 500 mL via INTRAVENOUS

## 2016-04-05 MED ORDER — LORAZEPAM 2 MG/ML IJ SOLN: 0.5000 mg | INTRAMUSCULAR | Status: DC | PRN

## 2016-04-05 MED ORDER — LABETALOL HCL 5 MG/ML IV SOLN
INTRAVENOUS | Status: AC
  Administered 2016-04-05: 10 mg via INTRAVENOUS
  Filled 2016-04-05: qty 4

## 2016-04-05 MED ORDER — SODIUM CHLORIDE 0.9 % IV SOLN
INTRAVENOUS | Status: DC
  Administered 2016-04-05 – 2016-04-08 (×4): via INTRAVENOUS

## 2016-04-05 NOTE — ED Provider Notes (Signed)
[facility]-EMERGENCY DEPT Provider Note   CSN: [id number] Arrival date & time: 04/05/16  1019     History   Chief Complaint Chief Complaint  Patient presents with  . Altered Mental Status    HPI Shelly Mcdaniel is a 60 y.o. female.  HPI   Patient is a 60 year old female with history of diabetes hypertension hyperlipidemia and recent stroke in August.  Patient had a stroke in August with residual left facial weakness and left arm and leg weakness. Patient has spent a month in rehabilitation in [place] and [facility]. Patient's been increasingly confused over the last 3 days. She's had urinary incontinence due to the confusion. Patient was seen yesterday for increasing confusion at outpatient provider. However she got worse this morning so they brought here to the emergency department.  Past Medical History:  Diagnosis Date  . Diabetes mellitus without complication (HCC)   . Hyperlipidemia   . Hypertension     Patient Active Problem List   Diagnosis Date Noted  . 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 TIA (transient ischemic attack) 11/07/2014  . Hypertension 11/07/2014  . DM II (diabetes mellitus, type II), controlled (HCC) 11/07/2014    Past Surgical History:  Procedure Laterality Date  . BACK SURGERY    . BREAST SURGERY    . COLONOSCOPY WITH PROPOFOL N/A 12/24/2013   Procedure: COLONOSCOPY WITH PROPOFOL;  Surgeon: Charolett Bumpers, MD;  Location: [facility];  Service: Endoscopy;  Laterality: N/A;    OB History    No data available       Home Medications    Prior to Admission medications   Medication Sig Start Date End Date Taking? Authorizing Provider  aspirin EC 81 MG tablet Take 81 mg by mouth daily.   Yes Historical Provider, MD  baclofen (LIORESAL) 10 MG tablet Take 10 mg by mouth 2 (two) times daily.    Yes Historical Provider, MD  docusate sodium (COLACE) 100 MG capsule Take 100 mg by mouth daily.   Yes Historical Provider, MD    insulin aspart (NOVOLOG FLEXPEN) 100 UNIT/ML FlexPen Inject 5 Units into the skin 3 (three) times daily with meals. Patient taking differently: Inject 4 Units into the skin 2 (two) times daily before lunch and supper.  11/10/14  Yes Ripudeep Jenna Luo, MD  LEVEMIR FLEXTOUCH 100 UNIT/ML Pen Inject 25 Units into the skin every evening.  11/06/14  Yes Historical Provider, MD  lidocaine (LIDODERM) 5 % Place 1 patch onto the skin daily. Remove & Discard patch within 12 hours or as directed by MD   Yes Historical Provider, MD  lisinopril (PRINIVIL,ZESTRIL) 40 MG tablet Take 40 mg by mouth daily. 02/16/16  Yes Historical Provider, MD  Melatonin 3 MG TABS Take 3 mg by mouth at bedtime. 02/16/16  Yes Historical Provider, MD  metFORMIN (GLUCOPHAGE-XR) 500 MG 24 hr tablet Take 1,000 mg by mouth 2 (two) times daily.    Yes Historical Provider, MD  ondansetron (ZOFRAN) 4 MG tablet Take 1 tablet (4 mg total) by mouth every 8 (eight) hours as needed for nausea or vomiting. 01/15/16  Yes Ofilia Neas, PA-C  polyethylene glycol (MIRALAX / GLYCOLAX) packet Take 17 g by mouth daily as needed for moderate constipation.   Yes Historical Provider, MD  pregabalin (LYRICA) 75 MG capsule Take 75 mg by mouth 2 (two) times daily.   Yes Historical Provider, MD  scopolamine (TRANSDERM-SCOP) 1 MG/3DAYS Place 1 patch onto the skin every 3 (  three) days.   Yes Historical Provider, MD  senna (SENOKOT) 8.6 MG tablet Take 1 tablet by mouth daily.   Yes Historical Provider, MD  sertraline (ZOLOFT) 50 MG tablet Take 50 mg by mouth at bedtime.    Yes Historical Provider, MD  simvastatin (ZOCOR) 40 MG tablet Take 40 mg by mouth at bedtime.   Yes Historical Provider, MD  traMADol (ULTRAM) 50 MG tablet Take 1 tablet (50 mg total) by mouth every 6 (six) hours as needed. 11/20/15  Yes Ofilia NeasMichael L Clark, PA-C  cephALEXin (KEFLEX) 500 MG capsule Take 1 capsule (500 mg total) by mouth 2 (two) times daily. Patient not taking: Reported on 04/05/2016 01/15/16    Ofilia NeasMichael L Clark, PA-C  gabapentin (NEURONTIN) 100 MG capsule Titrate dose as directed Patient not taking: Reported on 04/05/2016 08/20/15   Peyton Najjaravid H Hopper, MD  gabapentin (NEURONTIN) 300 MG capsule Titrate dose as directed Patient not taking: Reported on 04/05/2016 11/20/15   Ofilia NeasMichael L Clark, PA-C  quinapril-hydrochlorothiazide (ACCURETIC) 20-25 MG per tablet Take 1 tablet by mouth at bedtime.    Historical Provider, MD    Family History Family History  Problem Relation Age of Onset  . Stroke Mother   . Dementia Mother   . Diabetes Father   . Stroke Father   . CAD Father   . Diabetes Sister   . Diabetes Brother   . CAD Brother   . Stroke Brother   . Kidney failure Brother   . Hypertension Other   . Hyperlipidemia Other   . Stroke Other   . Heart disease Other   . Diabetes Other     Social History Social History  Substance Use Topics  . Smoking status: Never Smoker  . Smokeless tobacco: Never Used  . Alcohol use No     Allergies   Bee venom; Codeine; Lipitor [atorvastatin]; and Septra [sulfamethoxazole-trimethoprim]   Review of Systems Review of Systems  Constitutional: Positive for activity change.  Respiratory: Negative for shortness of breath.   Cardiovascular: Negative for chest pain.  Gastrointestinal: Negative for abdominal pain.  Neurological: Positive for weakness. Negative for dizziness, seizures, speech difficulty, light-headedness, numbness and headaches.  Psychiatric/Behavioral: Positive for confusion and decreased concentration.  All other systems reviewed and are negative.    Physical Exam Updated Vital Signs BP (!) 179/11 (BP Location: Right Arm)   Pulse 102   Temp 98.7 F (37.1 C) (Rectal)   Resp 11   Ht 5\' 8"  (1.727 m)   Wt 147 lb (66.7 kg)   SpO2 96%   BMI 22.35 kg/m   Physical Exam  Constitutional: She is oriented to person, place, and time. She appears well-developed and well-nourished.  HENT:  Head: Normocephalic and atraumatic.    Eyes: Right eye exhibits no discharge.  Cardiovascular: Normal rate, regular rhythm and normal heart sounds.   No murmur heard. Pulmonary/Chest: Effort normal and breath sounds normal. She has no wheezes. She has no rales.  Abdominal: Soft. She exhibits no distension. There is no tenderness.  Neurological: She is oriented to person, place, and time.  L face weakness L arm weakness 0/5,   Alert and oriented 3 however slow speech. No slurring.  Skin: Skin is warm and dry. She is not diaphoretic.  Psychiatric: She has a normal mood and affect.  Nursing note and vitals reviewed.    ED Treatments / Results  Labs (all labs ordered are listed, but only abnormal results are displayed) Labs Reviewed  URINALYSIS, ROUTINE W REFLEX  MICROSCOPIC (NOT AT [facility]) - Abnormal; Notable for the following:       Result Value   Ketones, ur 15 (*)    Leukocytes, UA TRACE (*)    All other components within normal limits  URINE MICROSCOPIC-ADD ON - Abnormal; Notable for the following:    Squamous Epithelial / LPF 0-5 (*)    Bacteria, UA FEW (*)    All other components within normal limits  I-STAT CHEM 8, ED - Abnormal; Notable for the following:    Creatinine, Ser 0.40 (*)    Glucose, Bld 151 (*)    All other components within normal limits  CBG MONITORING, ED - Abnormal; Notable for the following:    Glucose-Capillary 140 (*)    All other components within normal limits  ETHANOL  PROTIME-INR  APTT  RAPID URINE DRUG SCREEN, HOSP PERFORMED  CBC WITH DIFFERENTIAL/PLATELET  I-STAT TROPOININ, ED  I-STAT CG4 LACTIC ACID, ED  I-STAT CG4 LACTIC ACID, ED    EKG  EKG Interpretation None       Radiology Ct Head Wo Contrast  Result Date: 04/05/2016 CLINICAL DATA:  Elevated blood sugars. EXAM: CT HEAD WITHOUT CONTRAST TECHNIQUE: Contiguous axial images were obtained from the base of the skull through the vertex without intravenous contrast. COMPARISON:  None. FINDINGS: Brain: No acute  intracranial hemorrhage. No focal mass lesion. No CT evidence of acute infarction. No midline shift or mass effect. No hydrocephalus. Basilar cisterns are patent. Periventricular white matter hypodensities. 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 Vascular: No hyperdense vessel or unexpected calcification. Skull: Normal. Negative for fracture or focal lesion. Sinuses/Orbits: No acute finding. Other: None. IMPRESSION: 1. No acute intracranial findings. 2. 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 Electronically Signed   By: Genevive Bi M.D.   On: 04/05/2016 11:52    Procedures Procedures (including critical care time)  Medications Ordered in ED Medications  famotidine (PEPCID) IVPB 20 mg premix (20 mg Intravenous New Bag/Given 04/05/16 1344)  promethazine (PHENERGAN) injection 12.5 mg (12.5 mg Intravenous Given 04/05/16 1436)  sodium chloride 0.9 % bolus 500 mL (0 mLs Intravenous Stopped 04/05/16 1424)     Initial Impression / Assessment and Plan / ED Course  I have reviewed the triage vital signs and the nursing notes.  Pertinent labs & imaging results that were available during my care of the patient were reviewed by me and considered in my medical decision making (see chart for details).  Clinical Course    Patient is 60 year old female with recent stroke presenting today with increasing altered mental status over the last several days. Patient's been increasingly confused. Has had slowed speech Husband noted foul smelling urine. Patient also noted to have elevated blood sugars at home. And elevated BP.  We'll get CT head, and metabolic workup for this confusion. Concern for UTI causing this versus elevated blood sugars vs ichemic.  Patient persistently tachy after 1 L of fluid.   Will get MRI as inpatient, continue to monitor. ? Press vs waxing waning evolution of stroke.    Final Clinical Impressions(s) / ED Diagnoses   Final diagnoses:  None    New  Prescriptions New Prescriptions   No medications on file     Evelynn Hench Randall An, MD 04/05/16 1507

## 2016-04-05 NOTE — ED Notes (Signed)
Patient alert to self and responds to voice when asleep. Patient has been drowsy. Patient has old left side facial droop from previous CVA. Also left side pain on shoulder from CVA. Failed swallow screen due to being unable to follow commands well and drowsy. Last neuro check 2000. Patient received IV hydralazine for HTN at 1927. Can use bedpan, but has been incontinent. Family at bedside.

## 2016-04-05 NOTE — H&P (Signed)
History and Physical    Shelly Mcdaniel ZOX:[id number] DOB: June 10, 1956 DOA: 04/05/2016   PCP: Ginette Otto, MD   Patient coming from/Resides with: Private residence/lives with spouse  Admission status: Observation/telemetry -need to reevaluate within the next 24 hours to determine if it will be medically necessary to stay a minimum 2 midnights to rule out impending and/or unexpected changes in physiologic status that may differ from initial evaluation performed in the ER and/or at time of admission. 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 Patient not safe to swallow so will require IV fluids and IV medications until formal swallowing evaluation can be achieved. Ischemic stroke versus PRES evaluation in process. Needs frequent neurological evaluations to monitor for withdrawal from Lyrica, gabapentin and Zoloft since she is unable to take these medications PO.  Chief Complaint: Altered mentation for several days  HPI: Shelly Mcdaniel is a 60 y.o. 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 Patient with progression and altered mental status and decreased level of alertness for several days associated with confusion and some dysarthria. Family notes strong smelling urine. Because of altered mentation usual p.m. dose of Lyrica was decreased from 150 to 75 mg at hour of sleep in the past 24 hours. Her typical a.m. dose of Lyrica 75 mg has been continued. Patient had a fall in March with resultant persistent left rib cage pain and her medications have been adjusted by the pain clinic. According to family at bedside, patient was started on baclofen in September, Lyrica and August and had been on Neurontin without any  significant dosage changes.  Evaluation in the ER revealed normal noninfectious appearing urinalysis, persistent tachycardic with heart rates between 110 and 130 bpm, uncontrolled hypertension with systolic blood pressures persistently greater than 170 and diastolic blood pressures consistently greater than 100. Patient has been alternately lethargic and restless complaining of pain in her left leg. She was too lethargic to pass bedside swallow evaluation so was made NPO. Initial CT of the head without evidence of acute stroke but other abnormalities. Neurology has evaluated the patient with recommendations made.  ED Course:  Vital Signs: BP (!) 180/106   Pulse 120   Temp 98.7 F (37.1 C) (Rectal)   Resp 19   Ht 5\' 8"  (1.727 m)   Wt 66.7 kg (147 lb)   SpO2 98%   BMI 22.35 kg/m  CT head without contrast: No acute intracranial hemorrhage focal mass lesion or acute infarction. Lab data: Sodium 140, potassium 3.9, chloride 101, BUN 11, creatinine 0.4, glucose 151, poc troponin 0.00, lactic acid normal 2 collections 1.5 form 1.35, white count 6000 with normal differential, hemoglobin 13.8, platelets 192,000, urinalysis with few bacteria, 15 ketones, trace leukocytes, negative nitrite and 0-5 wbc's, alcohol level less than 5, urine drug screen negative Medications and treatments: Pepcid 20 mg IV 1, Phenergan 12.5 mg IV 1, normal saline bolus 500 mL 1  Review of Systems:  In addition to the HPI above,  No Fever-chills, myalgias or other constitutional symptoms No Headache, changes with Vision or hearing, new weakness, tingling, numbness in any extremity, dizziness, no change in chronic gait disturbance or imbalance, no tremors or seizure activity No observed problems swallowing food or Liquids at home, indigestion/reflux, choking or coughing while eating, abdominal pain with or  after eating No Cough or Shortness of Breath, palpitations, orthopnea or DOE No Abdominal pain, N/V,  melena,hematochezia, dark tarry stools, constipation No dysuria, hematuria or flank pain No new skin rashes, lesions, masses or bruises, No new joint pains, aches, swelling or redness No recent unintentional weight gain or loss No polyuria, polydypsia or polyphagia   Past Medical History:  Diagnosis Date  . Diabetes mellitus without complication (HCC)   . Hyperlipidemia   . Hypertension     Past Surgical History:  Procedure Laterality Date  . BACK SURGERY    . BREAST SURGERY    . COLONOSCOPY WITH PROPOFOL N/A 12/24/2013   Procedure: COLONOSCOPY WITH PROPOFOL;  Surgeon: Charolett Bumpers, MD;  Location: [facility];  Service: Endoscopy;  Laterality: N/A;    Social History   Social History  . Marital status: Married    Spouse name: N/A  . Number of children: N/A  . Years of education: N/A   Occupational History  . Not on file.   Social History Main Topics  . Smoking status: Never Smoker  . Smokeless tobacco: Never Used  . Alcohol use No  . Drug use: No  . Sexual activity: Not on file   Other Topics Concern  . Not on file   Social History Narrative  . 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 Bee Venom Anaphylaxis  . Codeine Itching  . Lipitor [Atorvastatin] Other (See Comments)    Cramping   . Septra [Sulfamethoxazole-Trimethoprim]     Mother died from Stevens-Johnson syndrome and sister had anaphylaxis from Septra.Physician told her never to take this medication.    Family History  Problem Relation Age of Onset  . Stroke Mother   . Dementia Mother   . Diabetes Father   . Stroke Father   . CAD Father   . Diabetes Sister   . Diabetes Brother   . CAD Brother   . Stroke Brother   . Kidney failure Brother   .  Hypertension Other   . Hyperlipidemia Other   . Stroke Other   . Heart disease Other   . Diabetes Other      Prior to Admission medications   Medication Sig Start Date End Date Taking? Authorizing Provider  aspirin EC 81 MG tablet Take 81 mg by mouth daily.   Yes Historical Provider, MD  baclofen (LIORESAL) 10 MG tablet Take 10 mg by mouth 2 (two) times daily.    Yes Historical Provider, MD  docusate sodium (COLACE) 100 MG capsule Take 100 mg by mouth daily.   Yes Historical Provider, MD  insulin aspart (NOVOLOG FLEXPEN) 100 UNIT/ML FlexPen Inject 5 Units into the skin 3 (three) times daily with meals. Patient taking differently: Inject 4 Units into the skin 2 (two) times daily before lunch and supper.  11/10/14  Yes Ripudeep Jenna Luo, MD  LEVEMIR FLEXTOUCH 100 UNIT/ML Pen Inject 25 Units into the skin every evening.  11/06/14  Yes Historical Provider, MD  lidocaine (LIDODERM) 5 % Place 1 patch onto the skin daily. Remove & Discard patch within 12 hours or as directed by MD   Yes Historical Provider, MD  lisinopril (PRINIVIL,ZESTRIL) 40 MG tablet Take 40 mg by mouth daily. 02/16/16  Yes Historical Provider,  MD  Melatonin 3 MG TABS Take 3 mg by mouth at bedtime. 02/16/16  Yes Historical Provider, MD  metFORMIN (GLUCOPHAGE-XR) 500 MG 24 hr tablet Take 1,000 mg by mouth 2 (two) times daily.    Yes Historical Provider, MD  ondansetron (ZOFRAN) 4 MG tablet Take 1 tablet (4 mg total) by mouth every 8 (eight) hours as needed for nausea or vomiting. 01/15/16  Yes Ofilia NeasMichael L Clark, PA-C  polyethylene glycol (MIRALAX / GLYCOLAX) packet Take 17 g by mouth daily as needed for moderate constipation.   Yes Historical Provider, MD  pregabalin (LYRICA) 75 MG capsule Take 75 mg by mouth 2 (two) times daily.   Yes Historical Provider, MD  scopolamine (TRANSDERM-SCOP) 1 MG/3DAYS Place 1 patch onto the skin every 3 (three) days.   Yes Historical Provider, MD  senna (SENOKOT) 8.6 MG tablet Take 1 tablet by mouth daily.   Yes  Historical Provider, MD  sertraline (ZOLOFT) 50 MG tablet Take 50 mg by mouth at bedtime.    Yes Historical Provider, MD  simvastatin (ZOCOR) 40 MG tablet Take 40 mg by mouth at bedtime.   Yes Historical Provider, MD  traMADol (ULTRAM) 50 MG tablet Take 1 tablet (50 mg total) by mouth every 6 (six) hours as needed. 11/20/15  Yes Ofilia NeasMichael L Clark, PA-C  cephALEXin (KEFLEX) 500 MG capsule Take 1 capsule (500 mg total) by mouth 2 (two) times daily. Patient not taking: Reported on 04/05/2016 01/15/16   Ofilia NeasMichael L Clark, PA-C  gabapentin (NEURONTIN) 100 MG capsule Titrate dose as directed Patient not taking: Reported on 04/05/2016 08/20/15   Peyton Najjaravid H Hopper, MD  gabapentin (NEURONTIN) 300 MG capsule Titrate dose as directed Patient not taking: Reported on 04/05/2016 11/20/15   Ofilia NeasMichael L Clark, PA-C  quinapril-hydrochlorothiazide (ACCURETIC) 20-25 MG per tablet Take 1 tablet by mouth at bedtime.    Historical Provider, MD    Physical Exam: Vitals:   04/05/16 1156 04/05/16 1338 04/05/16 1445 04/05/16 1545  BP:   (!) 182/107 (!) 180/106  Pulse:   112 120  Resp:   13 19  Temp: 98.7 F (37.1 C)     TempSrc: Rectal     SpO2:   95% 98%  Weight:  66.7 kg (147 lb)    Height:  5\' 8"  (1.727 m)        Constitutional: NAD, Restless, uncomfortable grabbing at left leg complaining of pain Eyes: PERRL, lids and conjunctivae normal ENMT: Mucous membranes are dry. Posterior pharynx clear of any exudate or lesions. Normal dentition.  Neck: normal, supple, no masses, no thyromegaly Respiratory: clear to auscultation bilaterally, no wheezing, no crackles. Normal respiratory effort. No accessory muscle use.  Cardiovascular: Regular tachycardic rate and rhythm, no murmurs / rubs / gallops. No extremity edema. 2+ pedal pulses. No carotid bruits.  Abdomen: no tenderness, no masses palpated. No hepatosplenomegaly. Bowel sounds positive.  Musculoskeletal: no clubbing / cyanosis. No joint deformity upper and lower  extremities. Good ROM, no contractures. Normal muscle tone.  Skin: no rashes, lesions, ulcers. No induration Neurologic: CN 2-12 appear to be grossly intact-? Horizontal nystagmus. Sensation intact, DTR normal. Strength 4-5/5 on right, dense LUE hemiparesis, some gross motors questioning purposeful movement of the LLE primarily associated with acute muscle spasms strength estimated at 3/5 Psychiatric: Alternates between being lethargic and awake making appropriate eye contact and attempts to verbalize all though is having some difficulty related to acute dysarthria during my evaluation; later my attending M.D. evaluated the patient and she was speaking  more appropriately    Labs on Admission: I have personally reviewed following labs and imaging studies  CBC:  Recent Labs Lab 04/05/16 1104 04/05/16 1129  WBC 6.0  --   NEUTROABS 3.9  --   HGB 13.8 13.6  HCT 39.4 40.0  MCV 87.0  --   PLT 192  --    Basic Metabolic Panel:  Recent Labs Lab 04/05/16 1129  NA 140  K 3.9  CL 101  GLUCOSE 151*  BUN 11  CREATININE 0.40*   GFR: Estimated Creatinine Clearance: 75.4 mL/min (by C-G formula based on SCr of 0.4 mg/dL (L)). Liver Function Tests: No results for input(s): AST, ALT, ALKPHOS, BILITOT, PROT, ALBUMIN in the last 168 hours. No results for input(s): LIPASE, AMYLASE in the last 168 hours. No results for input(s): AMMONIA in the last 168 hours. Coagulation Profile:  Recent Labs Lab 04/05/16 1104  INR 1.05   Cardiac Enzymes: No results for input(s): CKTOTAL, CKMB, CKMBINDEX, TROPONINI in the last 168 hours. BNP (last 3 results) No results for input(s): PROBNP in the last 8760 hours. HbA1C: No results for input(s): HGBA1C in the last 72 hours. CBG:  Recent Labs Lab 04/05/16 1100 04/05/16 1652  GLUCAP 140* 127*   Lipid Profile: No results for input(s): CHOL, HDL, LDLCALC, TRIG, CHOLHDL, LDLDIRECT in the last 72 hours. Thyroid Function Tests: No results for input(s):  TSH, T4TOTAL, FREET4, T3FREE, THYROIDAB in the last 72 hours. Anemia Panel: No results for input(s): VITAMINB12, FOLATE, FERRITIN, TIBC, IRON, RETICCTPCT in the last 72 hours. Urine analysis:    Component Value Date/Time   COLORURINE YELLOW 04/05/2016 1218   APPEARANCEUR CLEAR 04/05/2016 1218   LABSPEC 1.013 04/05/2016 1218   PHURINE 7.0 04/05/2016 1218   GLUCOSEU NEGATIVE 04/05/2016 1218   HGBUR NEGATIVE 04/05/2016 1218   BILIRUBINUR NEGATIVE 04/05/2016 1218   KETONESUR 15 (A) 04/05/2016 1218   PROTEINUR NEGATIVE 04/05/2016 1218   UROBILINOGEN 0.2 11/08/2014 0046   NITRITE NEGATIVE 04/05/2016 1218   LEUKOCYTESUR TRACE (A) 04/05/2016 1218   Sepsis Labs: @LABRCNTIP (procalcitonin:4,lacticidven:4) )No results found for this or any previous visit (from the past 240 hour(s)).   Radiological Exams on Admission: Ct Head Wo Contrast  Result Date: 04/05/2016 CLINICAL DATA:  Elevated blood sugars. EXAM: CT HEAD WITHOUT CONTRAST TECHNIQUE: Contiguous axial images were obtained from the base of the skull through the vertex without intravenous contrast. COMPARISON:  None. FINDINGS: Brain: No acute intracranial hemorrhage. No focal mass lesion. No CT evidence of acute infarction. No midline shift or mass effect. No hydrocephalus. Basilar cisterns are patent. Periventricular white matter hypodensities. 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 Vascular: No hyperdense vessel or unexpected calcification. Skull: Normal. Negative for fracture or focal lesion. Sinuses/Orbits: No acute finding. Other: None. IMPRESSION: 1. No acute intracranial findings. 2. 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 Electronically Signed   By: Genevive Bi M.D.   On: 04/05/2016 11:52    EKG: (Independently reviewed) sinus tachycardia with ventricular rate of 109 bpm, QTC 473 ms, no ischemic changes  Assessment/Plan Principal Problem:   Dysarthria/ Acute encephalopathy/ History of cerebrovascular  accident (CVA) -Patient presents with altered mentation 2 days with waxing and waning lethargy and dysarthria; h/o prior CVA in August [facility]) -Acute ischemic stroke order set initiated -Appreciate neurology assistance -EEG in process during neurology evaluation: demonstrated mild diffuse generalized slowing consistent with encephalopathy but no evidence of seizure activity -Do not suspect patient's Lyrica, baclofen or Neurontin contributing to current sx's since she has  been on these medications for several months without any problems -Ck MRI -Chest x-ray to rule out aspiration/occult infection -Check LFTs, ammonia level and TSH -Keep blood pressure well controlled since PRES in the differential-initially will utilize prn Hydralazine and may need to add scheduled labetalol or Lopressor to keep SBP < 180 and DBP <100 -Antiplatelet with aspirin suppository  Active Problems:   Uncontrolled hypertension -NPO so hold preadmission quinapril dose hydrochlorothiazide (med record also shows plain lisinopril as well so need to clarify prior to discharge) -prn hydralazine -May need to add scheduled IV labetalol versus Lopressor-this may help with tachycardia as well although I suspect tachycardia is physiologic response to dehydration and acute pain    DM II (diabetes mellitus, type II), controlled  -History of previously poorly controlled diabetes with hemoglobin A1c 14.1 in 2016 -Family states PCP has been working aggressively with patient and current hemoglobin A1c down to around 9 -Hemoglobin A1c while here -Continue preadmission Levemir with SSI -Hold metformin -Current CBG 151    Sinus tachycardia -Family reports poor oral intake and concentrated urine prior to admission so suspect dehydration primary contributed factor to tachycardia -Has received 500 mL normal saline bolus 1 and ER -Continue IV fluid -Follow heart rate on telemetry    Chronic pain -Prior to admission patient  was on baclofen, Lyrica, Ultram and Neurontin for issues related to LLE secondary to muscle spasticity post stroke -1 time dose IV Toradol -IV morphine prn while NPO but wants passes swallow evaluation would recommend completely discontinuing this medication in resuming home medications -IV Robaxin -Monitor for withdrawal    HLD (hyperlipidemia) -Preadmission Zocor on hold while NPO      DVT prophylaxis: Lovenox Code Status: Full  Family Communication: Husband at bedside Disposition Plan: Anticipate discharge back to preadmission confirmed once medically stable Consults called: Neuro hospitalist/Oster     Quavion Boule L. ANP-BC [facility] Pager [phone]   If 7PM-7AM, please contact night-coverage [email or web address] Password [email or web address]  04/05/2016, 5:02 PM

## 2016-04-05 NOTE — ED Notes (Signed)
Hospitalist requesting speech eval. Attempted eval, patient drowsy, and unable to follow commands to drink water. Eval stopped. Neurology at bedside. Hospitalist notified and speech eval ordered.

## 2016-04-05 NOTE — ED Notes (Signed)
Pt back in room, attached to monitor and resting comfortably.

## 2016-04-05 NOTE — Consult Note (Signed)
NEURO HOSPITALIST CONSULT NOTE   Requestig physician: Dr. Evelena Peat   Reason for Consult: confusion     HPI:                                                                                                                                          Shelly Mcdaniel is an 60 y.o. female with history of diabetes hypertension hyperlipidemia and recent stroke in August.  Patient had a stroke in August with residual left facial weakness and left arm and leg weakness. Patient has spent a month in rehabilitation in [place] and now [facility] as [facility] outpatient. Patient's been increasingly confused over the last 3 days. Husband at bedside notes that she usually can say a few words in a sentence but was unable to do so over the last few days. He states no significant changes in her medication have been made since she has had stroke. Patient's family did note this and decreased her Lyrica thinking it could be secondary to medication side effect.  Currently patient is in the ED and very sleepy. She is nonverbal but will follow verbal commands. If left alone she will quickly fall asleep. Patient does take baclofen for pain but has not had her baclofen today.  When speaking to the patient at times she will nod yes and no however she appears very encephalopathic at this time. There is no overt seizure activity noted.  Patient currently is afebrile however her blood pressure is 182/107 with elevated heart rate between 102-112.   BNP has been sent and shows normal sodium, potassium, BUN, creatinine. CMP is pending to evaluate for ammonia and LFTs. Chest x-ray has not been ordered while in the ED. Order has not been placed  Of the sedating medications the patient is on includes: Baclofen Ultram Lyrica Zoloft  Past Medical History:  Diagnosis Date  . Diabetes mellitus without complication (HCC)   . Hyperlipidemia   . Hypertension     Past Surgical History:  Procedure  Laterality Date  . BACK SURGERY    . BREAST SURGERY    . COLONOSCOPY WITH PROPOFOL N/A 12/24/2013   Procedure: COLONOSCOPY WITH PROPOFOL;  Surgeon: Charolett Bumpers, MD;  Location: [facility];  Service: Endoscopy;  Laterality: N/A;    Family History  Problem Relation Age of Onset  . Stroke Mother   . Dementia Mother   . Diabetes Father   . Stroke Father   . CAD Father   . Diabetes Sister   . Diabetes Brother   . CAD Brother   . Stroke Brother   . Kidney failure Brother   . Hypertension Other   . Hyperlipidemia Other   . Stroke Other   . Heart disease Other   . Diabetes  Other      Social History:  reports that she has never smoked. She has never used smokeless tobacco. She reports that she does not drink alcohol or use drugs.  Allergies  Allergen Reactions  . Bee Venom Anaphylaxis  . Codeine Itching  . Lipitor [Atorvastatin] Other (See Comments)    Cramping   . Septra [Sulfamethoxazole-Trimethoprim]     Mother died from Stevens-Johnson syndrome and sister had anaphylaxis from Septra.Physician told her never to take this medication.    MEDICATIONS:                                                                                                                     Current Facility-Administered Medications  Medication Dose Route Frequency Provider Last Rate Last Dose  . 0.9 %  sodium chloride infusion   Intravenous Continuous Russella DarAllison L Ellis, NP      . famotidine (PEPCID) IVPB 20 mg premix  20 mg Intravenous Q12H Courteney Lyn Mackuen, MD 100 mL/hr at 04/05/16 1344 20 mg at 04/05/16 1344  . insulin aspart (novoLOG) injection 0-15 Units  0-15 Units Subcutaneous Q4H Russella DarAllison L Ellis, NP      . ketorolac (TORADOL) 15 MG/ML injection 15 mg  15 mg Intravenous Once Russella DarAllison L Ellis, NP      . labetalol (NORMODYNE,TRANDATE) injection 20 mg  20 mg Intravenous Q2H PRN Russella DarAllison L Ellis, NP      . LORazepam (ATIVAN) injection 0.5-1 mg  0.5-1 mg Intravenous Q4H PRN Russella DarAllison L Ellis, NP       . methocarbamol (ROBAXIN) 500 mg in dextrose 5 % 50 mL IVPB  500 mg Intravenous Q6H PRN Russella DarAllison L Ellis, NP      . morphine 4 MG/ML injection 1-4 mg  1-4 mg Intravenous Q2H PRN Russella DarAllison L Ellis, NP      . promethazine (PHENERGAN) injection 12.5 mg  12.5 mg Intravenous Q4H PRN Courteney Lyn Mackuen, MD   12.5 mg at 04/05/16 1436   Current Outpatient Prescriptions  Medication Sig Dispense Refill  . aspirin EC 81 MG tablet Take 81 mg by mouth daily.    . baclofen (LIORESAL) 10 MG tablet Take 10 mg by mouth 2 (two) times daily.     Marland Kitchen. docusate sodium (COLACE) 100 MG capsule Take 100 mg by mouth daily.    . insulin aspart (NOVOLOG FLEXPEN) 100 UNIT/ML FlexPen Inject 5 Units into the skin 3 (three) times daily with meals. (Patient taking differently: Inject 4 Units into the skin 2 (two) times daily before lunch and supper. ) 15 mL 11  . LEVEMIR FLEXTOUCH 100 UNIT/ML Pen Inject 25 Units into the skin every evening.     . lidocaine (LIDODERM) 5 % Place 1 patch onto the skin daily. Remove & Discard patch within 12 hours or as directed by MD    . lisinopril (PRINIVIL,ZESTRIL) 40 MG tablet Take 40 mg by mouth daily.    . Melatonin 3 MG TABS Take 3 mg by mouth at bedtime.    .Marland Kitchen  metFORMIN (GLUCOPHAGE-XR) 500 MG 24 hr tablet Take 1,000 mg by mouth 2 (two) times daily.     . ondansetron (ZOFRAN) 4 MG tablet Take 1 tablet (4 mg total) by mouth every 8 (eight) hours as needed for nausea or vomiting. 20 tablet 0  . polyethylene glycol (MIRALAX / GLYCOLAX) packet Take 17 g by mouth daily as needed for moderate constipation.    . pregabalin (LYRICA) 75 MG capsule Take 75 mg by mouth 2 (two) times daily.    Marland Kitchen scopolamine (TRANSDERM-SCOP) 1 MG/3DAYS Place 1 patch onto the skin every 3 (three) days.    Marland Kitchen senna (SENOKOT) 8.6 MG tablet Take 1 tablet by mouth daily.    . sertraline (ZOLOFT) 50 MG tablet Take 50 mg by mouth at bedtime.     . simvastatin (ZOCOR) 40 MG tablet Take 40 mg by mouth at bedtime.    . traMADol  (ULTRAM) 50 MG tablet Take 1 tablet (50 mg total) by mouth every 6 (six) hours as needed. 5 tablet 0  . cephALEXin (KEFLEX) 500 MG capsule Take 1 capsule (500 mg total) by mouth 2 (two) times daily. (Patient not taking: Reported on 04/05/2016) 40 capsule 0  . gabapentin (NEURONTIN) 100 MG capsule Titrate dose as directed (Patient not taking: Reported on 04/05/2016) 90 capsule 1  . gabapentin (NEURONTIN) 300 MG capsule Titrate dose as directed (Patient not taking: Reported on 04/05/2016) 20 capsule 0  . quinapril-hydrochlorothiazide (ACCURETIC) 20-25 MG per tablet Take 1 tablet by mouth at bedtime.        ROS:                                                                                                                                       History obtained from unobtainable from patient due to mental status and language barrier     Blood pressure (!) 182/107, pulse 112, temperature 98.7 F (37.1 C), temperature source Rectal, resp. rate 13, height 5\' 8"  (1.727 m), weight 66.7 kg (147 lb), SpO2 95 %.   Neurologic Examination:                                                                                                      HEENT-  Normocephalic, no lesions, without obvious abnormality.  Normal external eye and conjunctiva.  Normal TM's bilaterally.  Normal auditory canals and external ears. Normal external nose, mucus membranes and septum.  Normal pharynx. Cardiovascular- S1, S2 normal, pulses palpable throughout   Lungs- chest  clear, no wheezing, rales, normal symmetric air entry Abdomen- soft, non-tender; bowel sounds normal; no masses,  no organomegaly Extremities- no joint deformities, effusion, or inflammation Lymph-no adenopathy palpable Musculoskeletal-no joint tenderness, deformity or swelling Skin-warm and dry, no hyperpigmentation, vitiligo, or suspicious lesions  Neurological Examination Mental Status: Alert, she is nonverbal but will follow commands that are simple but  not complex commands. cranial Nerves: II: Patient blinks to threat bilaterally, pupils equal, round, reactive to light and accommodation III,IV, VI: ptosis not present, extra-ocular motions intact bilaterally V,VII: smile asymmetric on the left,  VIII: hearing normal bilaterally IX,X: uvula rises symmetrically XI:  shoulder shrug intact on the right XII: midline tongue extension Motor: Right : Upper extremity   5/5    Left:     Upper extremity   0/5  Lower extremity   5/5     Lower extremity   4/5  Patient has no increased tone or rigidity. Sensory: Patient attempts to withdraw in all 4 extremities to pain. Deep Tendon Reflexes: 2+ and symmetric throughout Plantars: Right: downgoing   Left: Upgoing Cerebellar: Unable to assess secondary to patient's mental status Gait: Unable to assess      Lab Results: Basic Metabolic Panel:  Recent Labs Lab 04/05/16 1129  NA 140  K 3.9  CL 101  GLUCOSE 151*  BUN 11  CREATININE 0.40*    Liver Function Tests: No results for input(s): AST, ALT, ALKPHOS, BILITOT, PROT, ALBUMIN in the last 168 hours. No results for input(s): LIPASE, AMYLASE in the last 168 hours. No results for input(s): AMMONIA in the last 168 hours.  CBC:  Recent Labs Lab 04/05/16 1104 04/05/16 1129  WBC 6.0  --   NEUTROABS 3.9  --   HGB 13.8 13.6  HCT 39.4 40.0  MCV 87.0  --   PLT 192  --     Cardiac Enzymes: No results for input(s): CKTOTAL, CKMB, CKMBINDEX, TROPONINI in the last 168 hours.  Lipid Panel: No results for input(s): CHOL, TRIG, HDL, CHOLHDL, VLDL, LDLCALC in the last 168 hours.  CBG:  Recent Labs Lab 04/05/16 1100  GLUCAP 140*    Microbiology: Results for orders placed or performed in visit on 01/15/16  WOUND CULTURE     Status: None   Collection Time: 01/15/16 11:00 AM  Result Value Ref Range Status   Gram Stain Moderate  Final   Gram Stain WBC present-predominately PMN  Final   Gram Stain No Squamous Epithelial Cells Seen   Final   Gram Stain No Organisms Seen  Final   Organism ID, Bacteria NORMAL SKIN FLORA  Final    Comment: No Staphylococcus aureus isolated NO GROUP A STREP (S. PYOGENES) ISOLATED     Coagulation Studies:  Recent Labs  04/05/16 1104  LABPROT 13.7  INR 1.05    Imaging: Ct Head Wo Contrast  Result Date: 04/05/2016 CLINICAL DATA:  Elevated blood sugars. EXAM: CT HEAD WITHOUT CONTRAST TECHNIQUE: Contiguous axial images were obtained from the base of the skull through the vertex without intravenous contrast. COMPARISON:  None. FINDINGS: Brain: No acute intracranial hemorrhage. No focal mass lesion. No CT evidence of acute infarction. No midline shift or mass effect. No hydrocephalus. Basilar cisterns are patent. Periventricular white matter hypodensities. 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 Vascular: No hyperdense vessel or unexpected calcification. Skull: Normal. Negative for fracture or focal lesion. Sinuses/Orbits: No acute finding. Other: None. IMPRESSION: 1. No acute intracranial findings. 2. 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 Electronically Signed  By: Genevive Bi M.D.   On: 04/05/2016 11:52       Assessment and plan per attending neurologist  Felicie Morn PA-C [facility] [phone]  04/05/2016, 3:32 PM   Assessment/Plan: This is a 60 year old female with recent history of stroke affecting the left upper extremity and face. Patient has been working with physical therapy and making good strides with her left lower extremity. Over the past 3 days she has been noticed to become more confused. Etiology remains unclear. Currently patient looks very encephalopathic. EEG currently running while in the ED and will obtain MRI of brain when finished.

## 2016-04-05 NOTE — ED Notes (Signed)
Pt's husband advising that pt is having CP d/t indigestion. RN alerted.

## 2016-04-05 NOTE — ED Notes (Signed)
Patient transported to MRI 

## 2016-04-05 NOTE — Progress Notes (Signed)
EEG Completed; Results Pending  

## 2016-04-05 NOTE — ED Notes (Signed)
Pt being brought back by Lubertha BasqueSara RN

## 2016-04-05 NOTE — Procedures (Signed)
Electroencephalogram (EEG) Report  Date of study: 04/05/16  Requesting clinician: Rhona Leavensimothy Oster, MD  Reason for study: Evaluate for subclinical seizure  Brief clinical history: This is a 60 year old woman with recent small vessel stroke in August 2017 now presenting to the emergency department with 3 days of fluctuating mental status. EEG is being performed for further evaluation.  Medications:  Current Facility-Administered Medications:  .  0.9 %  sodium chloride infusion, , Intravenous, Continuous, Russella DarAllison L Ellis, NP, Last Rate: 75 mL/hr at 04/05/16 1500 .  famotidine (PEPCID) IVPB 20 mg premix, 20 mg, Intravenous, Q12H, Courteney Lyn Mackuen, MD, Last Rate: 100 mL/hr at 04/05/16 1344, 20 mg at 04/05/16 1344 .  insulin aspart (novoLOG) injection 0-15 Units, 0-15 Units, Subcutaneous, Q4H, Russella DarAllison L Ellis, NP .  labetalol (NORMODYNE,TRANDATE) injection 20 mg, 20 mg, Intravenous, Q2H PRN, Russella DarAllison L Ellis, NP .  LORazepam (ATIVAN) injection 0.5-1 mg, 0.5-1 mg, Intravenous, Q4H PRN, Russella DarAllison L Ellis, NP .  methocarbamol (ROBAXIN) 500 mg in dextrose 5 % 50 mL IVPB, 500 mg, Intravenous, Q6H PRN, Russella DarAllison L Ellis, NP .  morphine 4 MG/ML injection 1-4 mg, 1-4 mg, Intravenous, Q2H PRN, Russella DarAllison L Ellis, NP .  promethazine (PHENERGAN) injection 12.5 mg, 12.5 mg, Intravenous, Q4H PRN, Courteney Lyn Mackuen, MD, 12.5 mg at 04/05/16 1436  Current Outpatient Prescriptions:  .  aspirin EC 81 MG tablet, Take 81 mg by mouth daily., Disp: , Rfl:  .  baclofen (LIORESAL) 10 MG tablet, Take 10 mg by mouth 2 (two) times daily. , Disp: , Rfl:  .  docusate sodium (COLACE) 100 MG capsule, Take 100 mg by mouth daily., Disp: , Rfl:  .  insulin aspart (NOVOLOG FLEXPEN) 100 UNIT/ML FlexPen, Inject 5 Units into the skin 3 (three) times daily with meals. (Patient taking differently: Inject 4 Units into the skin 2 (two) times daily before lunch and supper. ), Disp: 15 mL, Rfl: 11 .  LEVEMIR FLEXTOUCH 100 UNIT/ML Pen,  Inject 25 Units into the skin every evening. , Disp: , Rfl:  .  lidocaine (LIDODERM) 5 %, Place 1 patch onto the skin daily. Remove & Discard patch within 12 hours or as directed by MD, Disp: , Rfl:  .  lisinopril (PRINIVIL,ZESTRIL) 40 MG tablet, Take 40 mg by mouth daily., Disp: , Rfl:  .  Melatonin 3 MG TABS, Take 3 mg by mouth at bedtime., Disp: , Rfl:  .  metFORMIN (GLUCOPHAGE-XR) 500 MG 24 hr tablet, Take 1,000 mg by mouth 2 (two) times daily. , Disp: , Rfl:  .  ondansetron (ZOFRAN) 4 MG tablet, Take 1 tablet (4 mg total) by mouth every 8 (eight) hours as needed for nausea or vomiting., Disp: 20 tablet, Rfl: 0 .  polyethylene glycol (MIRALAX / GLYCOLAX) packet, Take 17 g by mouth daily as needed for moderate constipation., Disp: , Rfl:  .  pregabalin (LYRICA) 75 MG capsule, Take 75 mg by mouth 2 (two) times daily., Disp: , Rfl:  .  scopolamine (TRANSDERM-SCOP) 1 MG/3DAYS, Place 1 patch onto the skin every 3 (three) days., Disp: , Rfl:  .  senna (SENOKOT) 8.6 MG tablet, Take 1 tablet by mouth daily., Disp: , Rfl:  .  sertraline (ZOLOFT) 50 MG tablet, Take 50 mg by mouth at bedtime. , Disp: , Rfl:  .  simvastatin (ZOCOR) 40 MG tablet, Take 40 mg by mouth at bedtime., Disp: , Rfl:  .  traMADol (ULTRAM) 50 MG tablet, Take 1 tablet (50 mg total) by mouth every 6 (  six) hours as needed., Disp: 5 tablet, Rfl: 0 .  cephALEXin (KEFLEX) 500 MG capsule, Take 1 capsule (500 mg total) by mouth 2 (two) times daily. (Patient not taking: Reported on 04/05/2016), Disp: 40 capsule, Rfl: 0 .  gabapentin (NEURONTIN) 100 MG capsule, Titrate dose as directed (Patient not taking: Reported on 04/05/2016), Disp: 90 capsule, Rfl: 1 .  gabapentin (NEURONTIN) 300 MG capsule, Titrate dose as directed (Patient not taking: Reported on 04/05/2016), Disp: 20 capsule, Rfl: 0 .  quinapril-hydrochlorothiazide (ACCURETIC) 20-25 MG per tablet, Take 1 tablet by mouth at bedtime., Disp: , Rfl:   Description: This is a routine EEG  performed using standard international 10-20 electrode placement. A total of 18 channels are recorded, including one for the EKG.  Activating Maneuvers: None  Findings:  The EKG channel demonstrates sinus tachycardia with a rate of 115-120 beats per minute.   The background consists predominantly of theta activity with average frequency 7 Hz. The best dominant posterior rhythm is 7 Hz. This is symmetric and reacts as expected with eye opening. There is mild disorganization of the background. There is a mild degree of intermixed delta slowing that is most pronounced in both frontal regions.  There are no focal asymmetries. No epileptiform discharges are present. No seizures are recorded.   Drowsiness is recorded and demonstrates increased slowing of the baseline.   Impression:  This is an abnormal EEG due to the presence of mild diffuse generalized slowing with greater intermixed bifrontal slowing. This pattern is nonspecific and consistent with a global encephalopathic process, nonspecific as to etiology. Considerations would include medication effect, metabolic derangement, primary neurodegenerative process among others. Clinical correlation is required.   There is no evidence of seizure on the study.   Rhona Leavens, MD [facility]

## 2016-04-05 NOTE — ED Triage Notes (Signed)
Pt here with her son and husband with hx of stroke. Last few days has been having altered mental status for several days, has been confused.  With strong urine smell, HR 110, BP 170/104. No new neuro deficits. Denies any pain.

## 2016-04-05 NOTE — ED Notes (Signed)
Pt currently on bed pan to give urine sample. Instructed pt to call out when finished.

## 2016-04-05 NOTE — ED Notes (Signed)
Rectal temp of 98.7 F

## 2016-04-06 ENCOUNTER — Encounter (HOSPITAL_COMMUNITY): Payer: BC Managed Care – PPO

## 2016-04-06 ENCOUNTER — Ambulatory Visit: Payer: BC Managed Care – PPO | Admitting: Occupational Therapy

## 2016-04-06 ENCOUNTER — Ambulatory Visit: Payer: BC Managed Care – PPO

## 2016-04-06 ENCOUNTER — Encounter: Payer: BC Managed Care – PPO | Admitting: Occupational Therapy

## 2016-04-06 DIAGNOSIS — Z885 Allergy status to narcotic agent status: Secondary | ICD-10-CM | POA: Diagnosis not present

## 2016-04-06 DIAGNOSIS — I69334 Monoplegia of upper limb following cerebral infarction affecting left non-dominant side: Secondary | ICD-10-CM | POA: Diagnosis not present

## 2016-04-06 DIAGNOSIS — G8929 Other chronic pain: Secondary | ICD-10-CM | POA: Diagnosis present

## 2016-04-06 DIAGNOSIS — E1165 Type 2 diabetes mellitus with hyperglycemia: Secondary | ICD-10-CM | POA: Diagnosis not present

## 2016-04-06 DIAGNOSIS — E1151 Type 2 diabetes mellitus with diabetic peripheral angiopathy without gangrene: Secondary | ICD-10-CM

## 2016-04-06 DIAGNOSIS — Z794 Long term (current) use of insulin: Secondary | ICD-10-CM | POA: Diagnosis not present

## 2016-04-06 DIAGNOSIS — I10 Essential (primary) hypertension: Secondary | ICD-10-CM | POA: Diagnosis present

## 2016-04-06 DIAGNOSIS — I651 Occlusion and stenosis of basilar artery: Secondary | ICD-10-CM | POA: Diagnosis present

## 2016-04-06 DIAGNOSIS — G9349 Other encephalopathy: Secondary | ICD-10-CM | POA: Diagnosis present

## 2016-04-06 DIAGNOSIS — Z888 Allergy status to other drugs, medicaments and biological substances status: Secondary | ICD-10-CM | POA: Diagnosis not present

## 2016-04-06 DIAGNOSIS — E1121 Type 2 diabetes mellitus with diabetic nephropathy: Secondary | ICD-10-CM | POA: Diagnosis not present

## 2016-04-06 DIAGNOSIS — Z823 Family history of stroke: Secondary | ICD-10-CM | POA: Diagnosis not present

## 2016-04-06 DIAGNOSIS — G9340 Encephalopathy, unspecified: Secondary | ICD-10-CM | POA: Diagnosis not present

## 2016-04-06 DIAGNOSIS — R252 Cramp and spasm: Secondary | ICD-10-CM | POA: Diagnosis not present

## 2016-04-06 DIAGNOSIS — F329 Major depressive disorder, single episode, unspecified: Secondary | ICD-10-CM | POA: Diagnosis present

## 2016-04-06 DIAGNOSIS — E876 Hypokalemia: Secondary | ICD-10-CM | POA: Diagnosis not present

## 2016-04-06 DIAGNOSIS — G8114 Spastic hemiplegia affecting left nondominant side: Secondary | ICD-10-CM | POA: Diagnosis not present

## 2016-04-06 DIAGNOSIS — K5900 Constipation, unspecified: Secondary | ICD-10-CM | POA: Diagnosis present

## 2016-04-06 DIAGNOSIS — R471 Dysarthria and anarthria: Secondary | ICD-10-CM | POA: Diagnosis present

## 2016-04-06 DIAGNOSIS — I639 Cerebral infarction, unspecified: Secondary | ICD-10-CM | POA: Diagnosis present

## 2016-04-06 DIAGNOSIS — I951 Orthostatic hypotension: Secondary | ICD-10-CM | POA: Diagnosis present

## 2016-04-06 DIAGNOSIS — R000 Tachycardia, unspecified: Secondary | ICD-10-CM | POA: Diagnosis present

## 2016-04-06 DIAGNOSIS — E1169 Type 2 diabetes mellitus with other specified complication: Secondary | ICD-10-CM | POA: Diagnosis present

## 2016-04-06 DIAGNOSIS — E860 Dehydration: Secondary | ICD-10-CM | POA: Diagnosis present

## 2016-04-06 DIAGNOSIS — Z7982 Long term (current) use of aspirin: Secondary | ICD-10-CM | POA: Diagnosis not present

## 2016-04-06 DIAGNOSIS — I6330 Cerebral infarction due to thrombosis of unspecified cerebral artery: Secondary | ICD-10-CM | POA: Diagnosis not present

## 2016-04-06 DIAGNOSIS — R4182 Altered mental status, unspecified: Secondary | ICD-10-CM | POA: Diagnosis present

## 2016-04-06 DIAGNOSIS — E785 Hyperlipidemia, unspecified: Secondary | ICD-10-CM

## 2016-04-06 DIAGNOSIS — Z79899 Other long term (current) drug therapy: Secondary | ICD-10-CM | POA: Diagnosis not present

## 2016-04-06 DIAGNOSIS — Z882 Allergy status to sulfonamides status: Secondary | ICD-10-CM | POA: Diagnosis not present

## 2016-04-06 DIAGNOSIS — E784 Other hyperlipidemia: Secondary | ICD-10-CM | POA: Diagnosis present

## 2016-04-06 DIAGNOSIS — E1142 Type 2 diabetes mellitus with diabetic polyneuropathy: Secondary | ICD-10-CM | POA: Diagnosis present

## 2016-04-06 DIAGNOSIS — M62838 Other muscle spasm: Secondary | ICD-10-CM | POA: Diagnosis present

## 2016-04-06 DIAGNOSIS — I6350 Cerebral infarction due to unspecified occlusion or stenosis of unspecified cerebral artery: Secondary | ICD-10-CM | POA: Diagnosis not present

## 2016-04-06 DIAGNOSIS — Z91030 Bee allergy status: Secondary | ICD-10-CM | POA: Diagnosis not present

## 2016-04-06 LAB — GLUCOSE, CAPILLARY
Glucose-Capillary: 102 mg/dL — ABNORMAL HIGH (ref 65–99)
Glucose-Capillary: 111 mg/dL — ABNORMAL HIGH (ref 65–99)
Glucose-Capillary: 118 mg/dL — ABNORMAL HIGH (ref 65–99)
Glucose-Capillary: 165 mg/dL — ABNORMAL HIGH (ref 65–99)
Glucose-Capillary: 165 mg/dL — ABNORMAL HIGH (ref 65–99)

## 2016-04-06 LAB — HEMOGLOBIN A1C
Hgb A1c MFr Bld: 6.7 % — ABNORMAL HIGH (ref 4.8–5.6)
Mean Plasma Glucose: 146 mg/dL

## 2016-04-06 MED ORDER — ACETAMINOPHEN 325 MG PO TABS
650.0000 mg | ORAL_TABLET | Freq: Four times a day (QID) | ORAL | Status: DC | PRN
  Administered 2016-04-06 – 2016-04-08 (×5): 650 mg via ORAL
  Filled 2016-04-06 (×7): qty 2

## 2016-04-06 MED ORDER — CLOPIDOGREL BISULFATE 75 MG PO TABS
75.0000 mg | ORAL_TABLET | Freq: Every day | ORAL | Status: DC
  Administered 2016-04-06 – 2016-04-07 (×2): 75 mg via ORAL
  Filled 2016-04-06 (×3): qty 1

## 2016-04-06 MED ORDER — ASPIRIN EC 325 MG PO TBEC
325.0000 mg | DELAYED_RELEASE_TABLET | Freq: Every day | ORAL | Status: DC
  Administered 2016-04-06 – 2016-04-07 (×2): 325 mg via ORAL
  Filled 2016-04-06 (×3): qty 1

## 2016-04-06 MED ORDER — SIMVASTATIN 40 MG PO TABS
40.0000 mg | ORAL_TABLET | Freq: Every day | ORAL | Status: DC
  Administered 2016-04-06 – 2016-04-07 (×2): 40 mg via ORAL
  Filled 2016-04-06 (×2): qty 1

## 2016-04-06 MED ORDER — ENOXAPARIN SODIUM 40 MG/0.4ML ~~LOC~~ SOLN
40.0000 mg | SUBCUTANEOUS | Status: DC
  Administered 2016-04-06 – 2016-04-08 (×3): 40 mg via SUBCUTANEOUS
  Filled 2016-04-06 (×3): qty 0.4

## 2016-04-06 MED ORDER — KETOROLAC TROMETHAMINE 15 MG/ML IJ SOLN
15.0000 mg | Freq: Once | INTRAMUSCULAR | Status: AC
  Administered 2016-04-06: 15 mg via INTRAVENOUS
  Filled 2016-04-06: qty 1

## 2016-04-06 MED ORDER — ASPIRIN 300 MG RE SUPP
300.0000 mg | Freq: Every day | RECTAL | Status: DC
  Administered 2016-04-08: 300 mg via RECTAL
  Filled 2016-04-06: qty 1

## 2016-04-06 NOTE — Progress Notes (Addendum)
Patient ID: Shelly Mcdaniel, female   DOB: Mar 30, 1956, 60 y.o.   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 Patient presented to [facility] with worsening mental status changes, decreased level of alertness and difficulty expressing herself. On admission, blood pressure was 182/107 and then 155/91. Patient was afebrile.blood work was relatively unremarkable. CT head showed no acute intracranial findings. Neuology will see her in consultation.   Assessment & Plan:   Principal Problem:   Dysarthria / Acute encephalopathy  - Aspirin daily, plavix daily  - MRI brain / MRA brain - Foci of diffusion restriction within the right hemipons and abnormal signal in the middle cerebellar peduncles probably represent areas of acute/early subacute ischemia. No hemorrhage identified. 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 - 2D ECHO - pending  - HgbA1c is 6.7 - Lipid panel - pending. LDL goal < 100. 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   Objective: Vitals:   04/06/16 0040 04/06/16 0245 04/06/16 0445 04/06/16 0645  BP: (!) 155/91 (!) 166/104 (!) 155/100 (!) 172/113  Pulse: (!) 111 (!) 104 (!) 105 (!) 108  Resp: 18 18 18 18   Temp: 98 F (36.7 C)   98.8 F (37.1 C)  TempSrc: Oral   Oral  SpO2: 98% 98% 95% 96%  Weight:      Height:        Intake/Output Summary (Last 24 hours) at 04/06/16 1044 Last data filed at 04/06/16 0500  Gross per 24 hour  Intake             1150 ml  Output              350 ml  Net              800 ml   Filed Weights   04/05/16 1338 04/05/16 2013  Weight: 66.7 kg (147 lb) 73.3 kg (161 lb 9.6 oz)    Examination:  General exam: Appears calm and comfortable  Respiratory system: Clear to auscultation. Respiratory effort normal. Cardiovascular system: S1 & S2 heard, Rate controlled. No JVD, murmurs, rubs, gallops or clicks. No pedal edema. Gastrointestinal system: Abdomen is nondistended, soft and nontender. No organomegaly or masses felt. Normal bowel sounds heard. Central nervous system: left side paralysis, able to follow commands and mover RUE and RLE, speech slow, cant answer in longer sentences  Extremities: Symmetric 5 x 5 power. Skin:  No rashes, lesions or ulcers Psychiatry: Normal mood and behavior   Data Reviewed: I have personally reviewed following labs and imaging studies  CBC:  Recent Labs Lab 04/05/16 1104 04/05/16 1129  WBC 6.0  --   NEUTROABS 3.9  --   HGB 13.8 13.6  HCT 39.4 40.0  MCV 87.0  --   PLT 192  --    Basic Metabolic Panel:  Recent Labs Lab 04/05/16 1129 04/05/16 1620  NA 140 142  K 3.9 3.9  CL 101 106  CO2  --  25  GLUCOSE 151* 137*  BUN 11 8  CREATININE 0.40* 0.53  CALCIUM  --  10.2   GFR: Estimated Creatinine Clearance: 75.4 mL/min (by C-G formula based on SCr of 0.53  mg/dL). Liver Function Tests:  Recent Labs Lab 04/05/16 1620  AST 15  ALT 15  ALKPHOS 56  BILITOT 0.8  PROT 6.9  ALBUMIN 4.1   No results for input(s): LIPASE, AMYLASE in the last 168 hours.  Recent Labs Lab 04/05/16 1620  AMMONIA 14   Coagulation Profile:  Recent Labs Lab 04/05/16 1104  INR 1.05   Cardiac Enzymes: No results for input(s): CKTOTAL, CKMB, CKMBINDEX, TROPONINI in the last 168 hours. BNP (last 3 results) No results for input(s): PROBNP in the last 8760 hours. HbA1C:  Recent Labs  04/05/16 1620  HGBA1C 6.7*   CBG:  Recent Labs Lab 04/05/16 1652 04/05/16 1950 04/05/16 2030 04/06/16 0001 04/06/16 0358  GLUCAP 127* 126* 132* 138* 118*   Lipid Profile: No results for input(s): CHOL, HDL, LDLCALC, TRIG, CHOLHDL, LDLDIRECT in the last 72 hours. Thyroid Function Tests:  Recent Labs  04/05/16 1620  TSH 1.073   Anemia Panel: No results for input(s): VITAMINB12, FOLATE, FERRITIN, TIBC, IRON, RETICCTPCT in the last 72 hours. Urine analysis:    Component Value Date/Time   COLORURINE YELLOW 04/05/2016 1218   APPEARANCEUR CLEAR 04/05/2016 1218   LABSPEC 1.013 04/05/2016 1218   PHURINE 7.0 04/05/2016 1218   GLUCOSEU NEGATIVE 04/05/2016 1218   HGBUR NEGATIVE 04/05/2016 1218   BILIRUBINUR NEGATIVE 04/05/2016 1218   KETONESUR 15 (A) 04/05/2016 1218   PROTEINUR NEGATIVE 04/05/2016 1218   UROBILINOGEN 0.2 11/08/2014 0046   NITRITE NEGATIVE 04/05/2016 1218   LEUKOCYTESUR TRACE (A) 04/05/2016 1218   Sepsis Labs: @LABRCNTIP (procalcitonin:4,lacticidven:4)   )No results found for this or any previous visit (from the past 240 hour(s)).    Radiology Studies: Dg Chest 2 View Result Date: 04/05/2016  No radiographic evidence of acute cardiopulmonary disease.   Ct Head Wo Contrast Result Date: 04/05/2016 1. No acute intracranial findings. 2. 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  Mr Maxine Glenn Head Wo Contrast Result Date: 04/05/2016 1.  Foci of diffusion restriction within the right hemipons and abnormal signal in the middle cerebellar peduncles probably represent areas of acute/early subacute ischemia. No hemorrhage identified. 2. 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 3. Otherwise the circle of Willis is patent with atherosclerotic changes and short segments of mild stenosis in the anterior and posterior circulation without large vessel occlusion or aneurysm. 4. Paranasal sinus disease predominantly in anterior ethmoid and right frontal sinuses. 5. Stable moderate chronic microvascular ischemic changes of the brain parenchyma. These results were called by telephone at the time of interpretation on 04/05/2016 at 6:25 pm to Dr. Erma Heritage, who verbally acknowledged these results.   Mr Brain Wo Contrast Result Date: 04/05/2016 1. Foci of diffusion restriction within the right hemipons and abnormal signal in the middle  cerebellar peduncles probably represent areas of acute/early subacute ischemia. No hemorrhage identified. 2. 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 3. Otherwise the circle of Willis is patent with atherosclerotic changes and short segments of mild stenosis in the anterior and posterior circulation without large vessel occlusion or aneurysm. 4. Paranasal sinus disease predominantly in anterior ethmoid and right frontal sinuses. 5. Stable moderate chronic microvascular ischemic changes of the brain parenchyma. These results were called by telephone at the time of interpretation on 04/05/2016 at 6:25 pm to Dr. Erma HeritageIsaacs, who verbally acknowledged these results.    Scheduled Meds: . aspirin EC  325 mg Oral Daily   Or  . aspirin  300 mg Rectal Daily  . clopidogrel  75 mg Oral Daily  . famotidine (PEPCID) IV  20 mg Intravenous Q12H  . insulin aspart  0-15 Units Subcutaneous Q4H  . insulin detemir  25 Units Subcutaneous QHS  . lidocaine  1 patch Transdermal Q24H  .  scopolamine  1 patch Transdermal Q72H  . simvastatin  40 mg Oral QHS   Continuous Infusions: . sodium chloride 75 mL/hr at 04/05/16 2249     LOS: 0 days      Manson PasseyEVINE, Jawann Urbani, MD [facility] Pager [phone]  If 7PM-7AM, please contact night-coverage [email or web address] Password [email or web address] 04/06/2016, 10:44 AM

## 2016-04-06 NOTE — Evaluation (Signed)
SLP Cancellation Note  Patient Details Name: Shelly Mcdaniel MRN: [id number] DOB: [birth year]   Cancelled treatment:       Reason Eval/Treat Not Completed:  (pt with MD at this time, SLP will return later for swallow evaluation as ordered, thanks. RN made aware)   Donavan Burnetamara Fisher Hargadon, MS [facility] SLP [phone]

## 2016-04-06 NOTE — Progress Notes (Signed)
STROKE TEAM PROGRESS NOTE   HISTORY OF PRESENT ILLNESS (per record) Shelly Mcdaniel is an 60 y.o. female with history of diabetes hypertension hyperlipidemia and recent stroke in August. Patient had a stroke in August with residual left facial weakness and left arm and leg weakness. Patient has spent a month in rehabilitation in [place] and now [facility]. Patient's been increasingly confused over the last 3 days. Husband at bedside notes that she usually can say a few words in a sentence but was unable to do so over the last few days. He states no significant changes in her medication have been made since she has had stroke. Patient's family did note this and decreased her Lyrica thinking it could be secondary to medication side effect.  Currently patient is in the ED and very sleepy. She is nonverbal but will follow verbal commands. If left alone she will quickly fall asleep. Patient does take baclofen for pain but has not had her baclofen today. When speaking to the patient at times she will nod yes and no however she appears very encephalopathic at this time. There is no overt seizure activity noted. In the ED, patient was afebrile however her blood pressure is 182/107 with elevated heart rate between 102-112. BNP has been sent and shows normal sodium, potassium, BUN, creatinine. CMP is pending to evaluate for ammonia and LFTs. Chest x-ray has not been ordered while in the ED. Order has not been placed. Of the sedating medications the patient is on includes: Baclofen, Ultram, Lyrica, Zoloft. Patient was not administered IV t-PA. She was admitted for further evaluation and treatment.   SUBJECTIVE (INTERVAL HISTORY) Husband at bedside. Recounted HPI with me. Not sure the infarct location in 01/2016 at [facility]. Husband is going to get a copy of the neuro imaging over there. He is also getting the TTE and CUS reports to [place] so that we would not repeat  those tests here again.    OBJECTIVE Temp:  [97.9 F (36.6 C)-98.8 F (37.1 C)] 98.8 F (37.1 C) (10/25 0645) Pulse Rate:  [102-120] 108 (10/25 0645) Cardiac Rhythm: Sinus tachycardia (10/25 0700) Resp:  [11-19] 18 (10/25 0645) BP: (155-182)/(11-113) 172/113 (10/25 0645) SpO2:  [95 %-99 %] 96 % (10/25 0645) Weight:  [66.7 kg (147 lb)-73.3 kg (161 lb 9.6 oz)] 73.3 kg (161 lb 9.6 oz) (10/24 2013)  CBC:  Recent Labs Lab 04/05/16 1104 04/05/16 1129  WBC 6.0  --   NEUTROABS 3.9  --   HGB 13.8 13.6  HCT 39.4 40.0  MCV 87.0  --   PLT 192  --     Basic Metabolic Panel:  Recent Labs Lab 04/05/16 1129 04/05/16 1620  NA 140 142  K 3.9 3.9  CL 101 106  CO2  --  25  GLUCOSE 151* 137*  BUN 11 8  CREATININE 0.40* 0.53  CALCIUM  --  10.2    Lipid Panel:    Component Value Date/Time   CHOL 177 11/08/2014 0527   TRIG 294 (H) 11/08/2014 0527   HDL 28 (L) 11/08/2014 0527   CHOLHDL 6.3 11/08/2014 0527   VLDL 59 (H) 11/08/2014 0527   LDLCALC 90 11/08/2014 0527   HgbA1c:  Lab Results  Component Value Date   HGBA1C 6.7 (H) 04/05/2016   Urine Drug Screen:    Component Value Date/Time   LABOPIA NONE DETECTED 04/05/2016 1215   COCAINSCRNUR NONE DETECTED 04/05/2016 1215   LABBENZ NONE DETECTED  04/05/2016 1215   AMPHETMU NONE DETECTED 04/05/2016 1215   THCU NONE DETECTED 04/05/2016 1215   LABBARB NONE DETECTED 04/05/2016 1215      IMAGING I have personally reviewed the radiological images below and agree with the radiology interpretations.  Dg Chest 2 View 04/05/2016 No radiographic evidence of acute cardiopulmonary disease.   Ct Head Wo Contrast 04/05/2016 1. No acute intracranial findings. 2. 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   Mr Brain 69 Contrast Mr Maxine Glenn Head Wo Contrast 04/05/2016 1. Foci of diffusion restriction within the right hemipons and abnormal signal in the middle cerebellar peduncles probably represent areas of acute/early subacute  ischemia. No hemorrhage identified. 2. 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 3. Otherwise the circle of Willis is patent with atherosclerotic changes and short segments of mild stenosis in the anterior and posterior circulation without large vessel occlusion or aneurysm. 4. Paranasal sinus disease predominantly in anterior ethmoid and right frontal sinuses. 5. Stable moderate chronic microvascular ischemic changes of the brain parenchyma.   EEG This is an abnormal EEG due to the presence of mild diffuse generalized slowing with greater intermixed bifrontal slowing. This pattern is nonspecific and consistent with a global encephalopathic process, nonspecific as to etiology. There is no evidence of seizure on the study.   PHYSICAL EXAM  Temp:  [98 F (36.7 C)-98.8 F (37.1 C)] 98.1 F (36.7 C) (10/25 0800) Pulse Rate:  [104-120] 108 (10/25 0800) Resp:  [13-19] 18 (10/25 0645) BP: (155-182)/(91-113) 157/95 (10/25 0800) SpO2:  [95 %-99 %] 98 % (10/25 0800) Weight:  [161 lb 9.6 oz (73.3 kg)] 161 lb 9.6 oz (73.3 kg) (10/24 2013)  General - Well nourished, well developed, in no apparent distress.  Ophthalmologic - Fundi not visualized due to eye movement.  Cardiovascular - Regular rate and rhythm.  Mental Status -  Level of arousal and orientation to month, and person were intact, not orientated to year and place. Language exam showed mild wording finding difficulty and slow speech, mild dysarthria, but intact naming, repetition, comprehension.  Cranial Nerves II - XII - II - Visual field intact OU. III, IV, VI - Extraocular movements intact, with slight disconjugation of left eye with left gaze. V - Facial sensation intact bilaterally. VII - left facial droop. VIII - Hearing & vestibular intact bilaterally. X - Palate elevates symmetrically, mild dysarthria. XI - Chin turning & shoulder shrug intact bilaterally. XII - Tongue protrusion  intact.  Motor Strength - The patient's strength was 5/5 RUE and RLE, but 0/5 LUE and 4-/5 LLE proximal but 0/5 DF and PF. Bulk was normal and fasciculations were absent.   Motor Tone - Muscle tone was assessed at the neck and appendages and was increased LUE.  Reflexes - The patient's reflexes were 1+ in all extremities and she had no pathological reflexes.  Sensory - Light touch, temperature/pinprick were assessed and were symmetrical.    Coordination - The patient had normal movements in the right hand with no ataxia or dysmetria.  Tremor was absent.  Gait and Station -  Not tested.   ASSESSMENT/PLAN Shelly Mcdaniel is a 60 y.o. female with history of stroke in Aug 2017 w/ L HP, HTN, HLD and DM presenting with increased confusion and lethargy x 3 days. She did not receive IV t-PA.   Stroke:  right pons and b/l cerebellar peduncle infarcts, likely due to BA stenosis  Resultant  Worsening left sided weakness  MRI  R pontine and  b/l cerebellar peduncle infarcts  MRA  Increasing mod to severe BA stenosis comparing with MRA 10/2014, right VA chronic occlusion  Carotid Doppler  [facility]  [facility]D Echo  [facility]  EEG generalized slowing, no seizure activity  LDL pending   HgbA1c 6.7  Lovenox 40 mg sq daily for VTE prophylaxis  Diet Carb Modified Fluid consistency: Thin; Room service appropriate? Yes  aspirin 81 mg daily prior to admission, now on aspirin 325 mg daily and clopidogrel 75 mg daily. Continue DAPT for 3 months and then plavix alone.  Patient counseled to be compliant with her antithrombotic medications  Ongoing aggressive stroke risk factor management  Therapy recommendations:  pending   Disposition:  pending   History of stroke  01/2016 with left hemiparesis   [facility]  Not sure about the location of infarct, pending MRI report from  husband  Pending TTE and CUS reports from husband  Discharged with ASA and zocor  Hypertensive Emergency  BP 180/106 on arrival in setting of neurologic symptoms  Supine hypertension  Will check orthostatic vitals  Permissive hypertension (OK if < 220/120) but gradually normalize in 5-7 days  BP goal 130-150 due to BA stenosis  Hyperlipidemia  Home meds:  zocor 40, resumed in hospital  LDL pending, goal < 70  Continue statin at discharge  Diabetes type II  HgbA1c 6.7, goal < 7.0  Controlled  Had hx of poorly controlled DM  On levemier  SSI  Other Stroke Risk Factors  Family hx stroke (mother, father, brother, other)  Other Active Problems  Sinus tachycardia  Chronic pain  BPPV treated in 10/2014  Hospital day # 0  Shelly PlanJindong Penelope Fittro, MD PhD Stroke Neurology 04/06/2016 2:58 PM   To contact Stroke Continuity provider, please refer to [email or web address]. After hours, contact General Neurology

## 2016-04-06 NOTE — Progress Notes (Signed)
Patient arrived to 5C13 at 2005, A/OX1, accompanied by family. Patient oriented to room, equipment , and all safety measures. Cardiac monitoring initiated. Weakness noted to left extremities. Will monitor closely overnight.

## 2016-04-06 NOTE — Evaluation (Signed)
Clinical/Bedside Swallow Evaluation Patient Details  Name: Shelly Mcdaniel MRN: [id number] Date of Birth: [birth year]  Today's Date: 04/06/2016 Time: SLP Start Time (ACUTE ONLY): 0910 SLP Stop Time (ACUTE ONLY): 1011 SLP Time Calculation (min) (ACUTE ONLY): 61 min  Past Medical History:  Past Medical History:  Diagnosis Date  . Diabetes mellitus without complication (HCC)   . Hyperlipidemia   . Hypertension    Past Surgical History:  Past Surgical History:  Procedure Laterality Date  . BACK SURGERY    . BREAST SURGERY    . COLONOSCOPY WITH PROPOFOL N/A 12/24/2013   Procedure: COLONOSCOPY WITH PROPOFOL;  Surgeon: Charolett BumpersMartin K Johnson, MD;  Location: [facility] ENDOSCOPY;  Service: Endoscopy;  Laterality: N/A;   HPI:  60 yo female with h/o CVA August 2017 with LUE weakness, dysarthria and pain now admitted with AMS, waxing/waning confusion, strong urine odor.  Pt with h/o Prior C4-C7 ACDF.   Pt CXR negative.  MRI showed diffuse restriction right hemipons, middle cerebellar peduncles ischemia.   Pt has seen SLP as OP for cognitive linguistic treatment.  She failed an RNSSS due to lethargy and decreased ability to follow commands.      Assessment / Plan / Recommendation Clinical Impression  Pt presents with functional oropharyngeal swallow ability.  She has baseline facial, trigeminal, hypoglossal nerve deficits and hoarse/strained voice from CVA August 2017.  Pt with slow responses today to SLP questions= written choices not helpful.   No indication of airway compromise with po observed *cracker, icecream, juice and water.  Advised pt to assure oral clearance of left due to trigeminal nerve impairment.   Reviewed aspiration precautions with pt and pt admits swallow function is baseline.  Recommend regular/thin diet wtih general precautions.      Aspiration Risk  Mild aspiration risk    Diet Recommendation Regular;Thin liquid   Liquid Administration via: Cup;Straw Medication Administration: Whole  meds with liquid Supervision: Patient able to self feed;Comment (set up assist) Compensations: Slow rate;Small sips/bites;Lingual sweep for clearance of pocketing Postural Changes: Seated upright at 90 degrees;Remain upright for at least 30 minutes after po intake    Other  Recommendations Oral Care Recommendations: Oral care BID   Follow up Recommendations  (none for swallowing, OP for cognitive linguistic abilities)      Frequency and Duration            Prognosis        Swallow Study   General Date of Onset: 04/06/16 HPI: 60 yo female with h/o CVA August 2017 with LUE weakness, dysarthria and pain now admitted with AMS, waxing/waning confusion, strong urine odor.  Pt with h/o Prior C4-C7 ACDF.   Pt CXR negative.  MRI showed diffuse restriction right hemipons, middle cerebellar peduncles ischemia.   Pt has seen SLP as OP for cognitive linguistic treatment.  She failed an RNSSS due to lethargy and decreased ability to follow commands.    Type of Study: Bedside Swallow Evaluation Previous Swallow Assessment: spouse reports pt had prior swallow test in xray in August 20147 with no significant findings, recommendation for regular/thin diet  Diet Prior to this Study: NPO Temperature Spikes Noted: No Respiratory Status: Room air History of Recent Intubation: No Behavior/Cognition: Alert;Cooperative;Pleasant mood Oral Cavity Assessment: Within Functional Limits Oral Care Completed by SLP: No Oral Cavity - Dentition: Adequate natural dentition Vision: Functional for self-feeding Self-Feeding Abilities: Able to feed self Patient Positioning: Upright in bed Baseline Vocal Quality: Hoarse (per pt and spouse, pt with hoarse voice since prior  cva) Volitional Cough: Strong Volitional Swallow: Able to elicit    Oral/Motor/Sensory Function Overall Oral Motor/Sensory Function: Mild impairment Facial ROM: Suspected CN VII (facial) dysfunction;Reduced left Facial Symmetry: Abnormal symmetry  left;Suspected CN VII (facial) dysfunction Facial Strength: Suspected CN VII (facial) dysfunction;Reduced left Facial Sensation: Suspected CN V (Trigeminal) dysfunction;Reduced left (lower) Lingual ROM: Suspected CN XII (hypoglossal) dysfunction Lingual Strength: Suspected CN XII (hypoglossal) dysfunction Velum: Other (comment) (sluggish but bilateral)   Ice Chips Ice chips: Not tested   Thin Liquid Thin Liquid: Within functional limits Presentation: Self Fed;Cup;Straw Other Comments: intermittent multiple swallows    Nectar Thick Nectar Thick Liquid: Not tested   Honey Thick Honey Thick Liquid: Not tested   Puree Puree: Within functional limits Presentation: Self Fed;Spoon   Solid   GO   Solid: Within functional limits Presentation: Self Fed    Functional Assessment Tool Used: clinical judgement Functional Limitations: Swallowing Swallow Current Status (W0981): At least 1 percent but less than 20 percent impaired, limited or restricted Swallow Goal Status [phone]): At least 1 percent but less than 20 percent impaired, limited or restricted Swallow Discharge Status [phone]): At least 1 percent but less than 20 percent impaired, limited or restricted   [place] Shelly Mcdaniel [facility] SLP [phone]

## 2016-04-06 NOTE — Progress Notes (Signed)
Responded to consult for advanced directive. Husband and patient were in rm,  resting. Emotional support was offered. Pt said she felt a bit stronger today physically. Husband apologized, saying he'd told the nurse to cancel the call for a chaplain to come do an advanced directive today. Their daughter is flying in tonight, and they wish to all talk together about it -- may be ready to do it tomorrow. Chaplain available for follow-up.    04/06/16 1400  Clinical Encounter Type  Visited With Patient and family together  Visit Type Initial;Social support  Referral From Nurse  Spiritual Encounters  Spiritual Needs Brochure;Emotional  Stress Factors  Patient Stress Factors Health changes  Family Stress Factors Exhausted;Family relationships  Advance Directives (For Healthcare)  Does patient have an advance directive? No  Would patient like information on creating an advanced directive? (Pt and husband now wisht to complete advamced directive.)

## 2016-04-06 NOTE — Care Management Obs Status (Deleted)
MEDICARE OBSERVATION STATUS NOTIFICATION   Patient Details  Name: Shelly Mcdaniel MRN: [id number] Date of Birth: Mar 13, 1956   Medicare Observation Status Notification Given:  Yes (MRI negative)    Kermit BaloKelli F Jefrey Raburn, RN 04/06/2016, 11:20 AM

## 2016-04-07 ENCOUNTER — Ambulatory Visit: Payer: BC Managed Care – PPO | Admitting: Rehabilitation

## 2016-04-07 ENCOUNTER — Encounter: Payer: BC Managed Care – PPO | Admitting: Occupational Therapy

## 2016-04-07 ENCOUNTER — Telehealth: Payer: Self-pay | Admitting: *Deleted

## 2016-04-07 DIAGNOSIS — I10 Essential (primary) hypertension: Secondary | ICD-10-CM

## 2016-04-07 DIAGNOSIS — G9340 Encephalopathy, unspecified: Secondary | ICD-10-CM

## 2016-04-07 DIAGNOSIS — Z794 Long term (current) use of insulin: Secondary | ICD-10-CM

## 2016-04-07 DIAGNOSIS — I639 Cerebral infarction, unspecified: Principal | ICD-10-CM

## 2016-04-07 DIAGNOSIS — R471 Dysarthria and anarthria: Secondary | ICD-10-CM

## 2016-04-07 DIAGNOSIS — E1159 Type 2 diabetes mellitus with other circulatory complications: Secondary | ICD-10-CM

## 2016-04-07 DIAGNOSIS — I651 Occlusion and stenosis of basilar artery: Secondary | ICD-10-CM

## 2016-04-07 DIAGNOSIS — I6330 Cerebral infarction due to thrombosis of unspecified cerebral artery: Secondary | ICD-10-CM

## 2016-04-07 LAB — LIPID PANEL
CHOL/HDL RATIO: 5.7 ratio
CHOLESTEROL: 164 mg/dL (ref 0–200)
HDL: 29 mg/dL — AB (ref >40)
LDL Cholesterol: 110 mg/dL — ABNORMAL HIGH (ref 0–99)
Triglycerides: 125 mg/dL (ref <150)
VLDL: 25 mg/dL (ref 0–40)

## 2016-04-07 LAB — GLUCOSE, CAPILLARY
GLUCOSE-CAPILLARY: 130 mg/dL — AB (ref 65–99)
GLUCOSE-CAPILLARY: 165 mg/dL — AB (ref 65–99)
Glucose-Capillary: 125 mg/dL — ABNORMAL HIGH (ref 65–99)
Glucose-Capillary: 168 mg/dL — ABNORMAL HIGH (ref 65–99)
Glucose-Capillary: 109 mg/dL — ABNORMAL HIGH (ref 65–99)

## 2016-04-07 LAB — FOLATE: FOLATE: 21.7 ng/mL (ref >5.9)

## 2016-04-07 LAB — VITAMIN B12: Vitamin B-12: 320 pg/mL (ref 180–914)

## 2016-04-07 MED ORDER — FAMOTIDINE 20 MG PO TABS
20.0000 mg | ORAL_TABLET | Freq: Two times a day (BID) | ORAL | Status: DC
  Administered 2016-04-07: 20 mg via ORAL
  Filled 2016-04-07 (×2): qty 1

## 2016-04-07 MED ORDER — POLYETHYLENE GLYCOL 3350 17 G PO PACK
17.0000 g | PACK | Freq: Every day | ORAL | Status: DC
  Administered 2016-04-07: 17 g via ORAL
  Filled 2016-04-07 (×2): qty 1

## 2016-04-07 MED ORDER — BACLOFEN 10 MG PO TABS
10.0000 mg | ORAL_TABLET | Freq: Three times a day (TID) | ORAL | Status: DC
  Administered 2016-04-07 (×2): 10 mg via ORAL
  Filled 2016-04-07 (×3): qty 1

## 2016-04-07 MED ORDER — AMLODIPINE BESYLATE 5 MG PO TABS
5.0000 mg | ORAL_TABLET | Freq: Every day | ORAL | Status: DC
  Administered 2016-04-07: 5 mg via ORAL
  Filled 2016-04-07 (×2): qty 1

## 2016-04-07 NOTE — Progress Notes (Addendum)
STROKE TEAM PROGRESS NOTE   SUBJECTIVE (INTERVAL HISTORY) Husband and daughter are at bedside. Daughter showed me MRI result in 01/2016 in [facility] that pt had right pontine infarct at that time. TTE and CUS report sent to my office at [facility]. However, daughter who is an anesthesiologist, stated that she had saw the result in the past and both were normal. PT in room work with pt too and recommend CIR.     OBJECTIVE Temp:  [98.1 F (36.7 C)-98.7 F (37.1 C)] 98.2 F (36.8 C) (10/26 0944) Pulse Rate:  [97-110] 102 (10/26 0944) Cardiac Rhythm: Sinus tachycardia (10/26 0700) Resp:  [16-20] 16 (10/26 0944) BP: (164-181)/(81-111) 173/100 (10/26 0944) SpO2:  [95 %-100 %] 100 % (10/26 0944)  CBC:   Recent Labs Lab 04/05/16 1104 04/05/16 1129  WBC 6.0  --   NEUTROABS 3.9  --   HGB 13.8 13.6  HCT 39.4 40.0  MCV 87.0  --   PLT 192  --     Basic Metabolic Panel:   Recent Labs Lab 04/05/16 1129 04/05/16 1620  NA 140 142  K 3.9 3.9  CL 101 106  CO2  --  25  GLUCOSE 151* 137*  BUN 11 8  CREATININE 0.40* 0.53  CALCIUM  --  10.2    Lipid Panel:     Component Value Date/Time   CHOL 164 04/07/2016 0317   TRIG 125 04/07/2016 0317   HDL 29 (L) 04/07/2016 0317   CHOLHDL 5.7 04/07/2016 0317   VLDL 25 04/07/2016 0317   LDLCALC 110 (H) 04/07/2016 0317   HgbA1c:  Lab Results  Component Value Date   HGBA1C 6.7 (H) 04/05/2016   Urine Drug Screen:     Component Value Date/Time   LABOPIA NONE DETECTED 04/05/2016 1215   COCAINSCRNUR NONE DETECTED 04/05/2016 1215   LABBENZ NONE DETECTED 04/05/2016 1215   AMPHETMU NONE DETECTED 04/05/2016 1215   THCU NONE DETECTED 04/05/2016 1215   LABBARB NONE DETECTED 04/05/2016 1215      IMAGING I have personally reviewed the radiological images below and agree with the radiology interpretations.  Dg Chest 2 View 04/05/2016 No radiographic evidence of acute cardiopulmonary disease.   Ct Head Wo Contrast 04/05/2016 1.  No acute intracranial findings. 2. 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   Mr Brain 21 Contrast Mr Maxine Glenn Head Wo Contrast 04/05/2016 1. Foci of diffusion restriction within the right hemipons and abnormal signal in the middle cerebellar peduncles probably represent areas of acute/early subacute ischemia. No hemorrhage identified. 2. 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 3. Otherwise the circle of Willis is patent with atherosclerotic changes and short segments of mild stenosis in the anterior and posterior circulation without large vessel occlusion or aneurysm. 4. Paranasal sinus disease predominantly in anterior ethmoid and right frontal sinuses. 5. Stable moderate chronic microvascular ischemic changes of the brain parenchyma.   EEG This is an abnormal EEG due to the presence of mild diffuse generalized slowing with greater intermixed bifrontal slowing. This pattern is nonspecific and consistent with a global encephalopathic process, nonspecific as to etiology. There is no evidence of seizure on the study.  CUS 01/17/2016 - bilateral plaques, but no hemodynamically significant stenosis  TTE - 01/17/16 - mild diastolic dysfunction with borderline left ventricular hypertrophy with a trace amount of mital regurgitation, trace amount of aortic regurgitation, trace amount of tricuspid regurgitation.  MRI brain 01/17/16 - atrophy and chronic micro-vascular ischemic changes. There is restricted diffusion on the  right side of the pons consistent with an acute infarct.    PHYSICAL EXAM  Temp:  [98.1 F (36.7 C)-98.7 F (37.1 C)] 98.2 F (36.8 C) (10/26 0944) Pulse Rate:  [97-110] 102 (10/26 0944) Resp:  [16-20] 16 (10/26 0944) BP: (164-181)/(81-111) 173/100 (10/26 0944) SpO2:  [95 %-100 %] 100 % (10/26 0944)  General - Well nourished, well developed, in no apparent distress.  Ophthalmologic - Fundi not visualized due to eye movement.  Cardiovascular -  Regular rate and rhythm.  Mental Status -  Level of arousal and orientation to month, and person were intact, not orientated to year and place. Language exam showed mild wording finding difficulty and slow speech, mild dysarthria, but intact naming, repetition, comprehension.  Cranial Nerves II - XII - II - Visual field intact OU. III, IV, VI - Extraocular movements intact, with slight disconjugation of left eye on left eye gaze. V - Facial sensation intact bilaterally. VII - left facial droop. VIII - Hearing & vestibular intact bilaterally. X - Palate elevates symmetrically, mild dysarthria. XI - Chin turning & shoulder shrug intact bilaterally. XII - Tongue protrusion intact.  Motor Strength - The patient's strength was 5/5 RUE and RLE, but 0/5 LUE and 4-/5 LLE proximal but 0/5 DF and PF. Bulk was normal and fasciculations were absent.   Motor Tone - Muscle tone was assessed at the neck and appendages and was increased LUE.  Reflexes - The patient's reflexes were 1+ in all extremities and she had no pathological reflexes.  Sensory - Light touch, temperature/pinprick were assessed and were symmetrical.    Coordination - The patient had normal movements in the right hand with no ataxia or dysmetria.  Tremor was absent.  Gait and Station -  Able to stand with assistance and not able to walk without assistance.   ASSESSMENT/PLAN [name] is a 60 y.o. female with history of stroke in Aug 2017 w/ L HP, HTN, HLD and DM presenting with increased confusion and lethargy x 3 days. She did not receive IV t-PA.   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 Need maximize the medical treatment first.  Resultant  Worsening left sided weakness  MRI  R pontine and b/l cerebellar peduncle infarcts  MRA  Increasing mod to severe BA stenosis comparing with MRA 10/2014, right VA chronic occlusion  Carotid Doppler 01/17/2016 unremarkable  2D Echo  01/17/2016 unremarkable  EEG generalized slowing, no seizure activity  LDL 110  HgbA1c 6.7  Lovenox 40 mg sq daily for VTE prophylaxis Diet Carb Modified Fluid consistency: Thin; Room service appropriate? Yes  aspirin 81 mg daily prior to admission, now on aspirin 325 mg daily and clopidogrel 75 mg daily. Continue DAPT for 3 months and then plavix alone. Need to maximize the medical treatment first. If still not sufficient, may consider stent in the future.  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   Treated in [facility] hospital  MRI report in 01/17/2016 - right pontine infarct, pending MRI imaging to be reviewed  TTE and CUS both unremarkable  Discharged with ASA and zocor  Pt not able to tolerate high dose lipitor  Orthostatic hypotension with supine hypertension  BP 180/106 on arrival in setting of neurologic symptoms  Supine hypertension Orthostatic VS for  the past 24 hrs:  BP- Lying Pulse- Lying BP- Sitting Pulse- Sitting BP- Standing at 0 minutes Pulse- Standing at 0 minutes  04/07/16 1414 (!) 164/99 113 (!) 144/94 120 (!) 85/45 139  positive orthostatic hypotension Put on TED hose Bed of head 30 degree hydration BP goal 130-150 due to BA stenosis  Hyperlipidemia  Home meds:  zocor 40, resumed in hospital  LDL 110, goal < 70  Continue statin at discharge  Pt not able to tolerate high dose lipitor  Diabetes type II  HgbA1c 6.7, goal < 7.0  Controlled  Had hx of poorly controlled DM  On levemier  SSI  Other Stroke Risk Factors  Family hx stroke (mother, father, brother, other)  Other Active Problems  Sinus tachycardia  Chronic pain  BPPV treated in 10/2014  Hospital day # 1   Marvel PlanJindong Lillie Bollig, MD PhD Stroke Neurology 04/07/2016 2:07 PM   To  contact Stroke Continuity provider, please refer to [email or web address]. After hours, contact General Neurology

## 2016-04-07 NOTE — Progress Notes (Addendum)
Patient ID: Shelly Mcdaniel, female   DOB: 09-13-1955, 60 y.o.   MRN: [id number]  PROGRESS NOTE    Shelly Mcdaniel  NWG:[id number] DOB: [birth year] DOA: 04/05/2016  PCP: Ginette Otto, MD   Brief Narrative:  36 -year-old female with past medical history significant for CVA in August 2017 with residual left-sided paralysis, diabetes, hypertension, dyslipidemia. Patient [facility] to [facility] with worsening mental status changes, decreased level of alertness and difficulty expressing herself. On admission, blood pressure was 182/107 and then 155/91. Patient was afebrile.blood work was relatively unremarkable. CT head showed no acute intracranial findings. MRI brain showed foci of diffusion restriction within the right hemipons and abnormal signal in the middle cerebellar peduncles probably represent areas of acute/early subacute ischemia. No hemorrhage identified. 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  Assessment & Plan:   Principal Problem:   Dysarthria / Acute encephalopathy / Acute CVA / Basilar artery stenosis  - Aspirin daily, plavix daily  - MRI brain / MRA brain - Foci of diffusion restriction within the right hemipons and abnormal signal in the middle cerebellar peduncles probably represent areas of acute/early subacute ischemia. No hemorrhage identified. 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 - 2D ECHO - family to bring in the records from recent stroke work up - HgbA1c is 6.7 - Lipid panel -  LDL 110; goal < 100. Patient on simvastatin 40 mg at bedtime - Diet: carb modified  - Therapy: PT/OT - in progress as of 04/07/2016; family prefers inpatient rehab  Other Stroke Risk Factors :  history of CVA, hypertension  - Appreciate neurology following  Active Problems:   Uncontrolled hypertension - Will start low dose Norvasc 5 mg daily     DM II (diabetes mellitus, type II), controlled with  circulatory complications with long term insulin use (HCC) - A1c 6.7 indicating good glycemic control - Continue Levemir 25 units at bedtime and sliding scale insulin - CBG's in past 24 hours: 111, 125, 130    Dyslipidemia associated with type 2 diabetes  - Continue simvastatin 40 mg at bedtime   DVT prophylaxis: Lovenox subQ Code Status: full code  Family Communication: husband at the bedside this am Disposition Plan: needs PT eval, awaiting for family to bring the records from the outside facility in regards to recent stroke work up. Family prefers that patient is discharged to inpatient rehabilitation. PT evaluation pending   Consultants:   Neurology   Inpatient rehabilitation  Procedures:   None   Antimicrobials:   None    Subjective: No overnight events.   Objective: Vitals:   04/06/16 2100 04/07/16 0123 04/07/16 0551 04/07/16 0944  BP: (!) 164/81 (!) 171/101 (!) 175/111 (!) 173/100  Pulse: 97 (!) 108 (!) 110 (!) 102  Resp: 20 20 20 16   Temp: 98.7 F (37.1 C) 98.3 F (36.8 C) 98.1 F (36.7 C) 98.2 F (36.8 C)  TempSrc: Oral Oral Oral Oral  SpO2: 97% 95% 98% 100%  Weight:      Height:        Intake/Output Summary (Last 24 hours) at 04/07/16 1059 Last data filed at 04/07/16 0551  Gross per 24 hour  Intake              480 ml  Output                0 ml  Net              480 ml  Filed Weights   04/05/16 1338 04/05/16 2013  Weight: 66.7 kg (147 lb) 73.3 kg (161 lb 9.6 oz)    Examination:  General exam: Appears calm and comfortable, More alert this morning Respiratory system: No wheezing, no rhonchi Cardiovascular system: S1 & S2 heard, tachycardic  Gastrointestinal system: (+) BS, non tender and non distended abdomen  Central nervous system: left side paralysis, able to follow commands and mover RUE and RLE Extremities: No swelling, palpable pulses  Skin: warm and dry  Psychiatry: Normal mood and behavior   Data Reviewed: I have personally  reviewed following labs and imaging studies  CBC:  Recent Labs Lab 04/05/16 1104 04/05/16 1129  WBC 6.0  --   NEUTROABS 3.9  --   HGB 13.8 13.6  HCT 39.4 40.0  MCV 87.0  --   PLT 192  --    Basic Metabolic Panel:  Recent Labs Lab 04/05/16 1129 04/05/16 1620  NA 140 142  K 3.9 3.9  CL 101 106  CO2  --  25  GLUCOSE 151* 137*  BUN 11 8  CREATININE 0.40* 0.53  CALCIUM  --  10.2   GFR: Estimated Creatinine Clearance: 75.4 mL/min (by C-G formula based on SCr of 0.53 mg/dL). Liver Function Tests:  Recent Labs Lab 04/05/16 1620  AST 15  ALT 15  ALKPHOS 56  BILITOT 0.8  PROT 6.9  ALBUMIN 4.1   No results for input(s): LIPASE, AMYLASE in the last 168 hours.  Recent Labs Lab 04/05/16 1620  AMMONIA 14   Coagulation Profile:  Recent Labs Lab 04/05/16 1104  INR 1.05   Cardiac Enzymes: No results for input(s): CKTOTAL, CKMB, CKMBINDEX, TROPONINI in the last 168 hours. BNP (last 3 results) No results for input(s): PROBNP in the last 8760 hours. HbA1C:  Recent Labs  04/05/16 1620  HGBA1C 6.7*   CBG:  Recent Labs Lab 04/06/16 1623 04/06/16 1936 04/06/16 2337 04/07/16 0340 04/07/16 0758  GLUCAP 102* 165* 111* 125* 130*   Lipid Profile:  Recent Labs  04/07/16 0317  CHOL 164  HDL 29*  LDLCALC 110*  TRIG 125  CHOLHDL 5.7   Thyroid Function Tests:  Recent Labs  04/05/16 1620  TSH 1.073   Anemia Panel:  Recent Labs  04/07/16 0317  VITAMINB12 320  FOLATE 21.7   Urine analysis:    Component Value Date/Time   COLORURINE YELLOW 04/05/2016 1218   APPEARANCEUR CLEAR 04/05/2016 1218   LABSPEC 1.013 04/05/2016 1218   PHURINE 7.0 04/05/2016 1218   GLUCOSEU NEGATIVE 04/05/2016 1218   HGBUR NEGATIVE 04/05/2016 1218   BILIRUBINUR NEGATIVE 04/05/2016 1218   KETONESUR 15 (A) 04/05/2016 1218   PROTEINUR NEGATIVE 04/05/2016 1218   UROBILINOGEN 0.2 11/08/2014 0046   NITRITE NEGATIVE 04/05/2016 1218   LEUKOCYTESUR TRACE (A) 04/05/2016 1218    Sepsis Labs: @LABRCNTIP (procalcitonin:4,lacticidven:4)   )No results found for this or any previous visit (from the past 240 hour(s)).    Radiology Studies: Dg Chest 2 View Result Date: 04/05/2016  No radiographic evidence of acute cardiopulmonary disease.   Ct Head Wo Contrast Result Date: 04/05/2016 1. No acute intracranial findings. 2. 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  Mr Maxine GlennMra Head Wo Contrast Result Date: 04/05/2016 1. Foci of diffusion restriction within the right hemipons and abnormal signal in the middle cerebellar peduncles probably represent areas of acute/early subacute ischemia. No hemorrhage identified. 2. 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 3. Otherwise the circle of Willis is patent with atherosclerotic changes and  short segments of mild stenosis in the anterior and posterior circulation without large vessel occlusion or aneurysm. 4. Paranasal sinus disease predominantly in anterior ethmoid and right frontal sinuses. 5. Stable moderate chronic microvascular ischemic changes of the brain parenchyma. These results were called by telephone at the time of interpretation on 04/05/2016 at 6:25 pm to Dr. Erma Heritage, who verbally acknowledged these results.   Mr Brain Wo Contrast Result Date: 04/05/2016 1. Foci of diffusion restriction within the right hemipons and abnormal signal in the middle cerebellar peduncles probably represent areas of acute/early subacute ischemia. No hemorrhage identified. 2. 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 3. Otherwise the circle of Willis is patent with atherosclerotic changes and short segments of mild stenosis in the anterior and posterior circulation without large vessel occlusion or aneurysm. 4. Paranasal sinus disease predominantly in anterior ethmoid and right frontal sinuses. 5. Stable moderate chronic microvascular ischemic changes of the brain parenchyma.  These results were called by telephone at the time of interpretation on 04/05/2016 at 6:25 pm to Dr. Erma Heritage, who verbally acknowledged these results.    Scheduled Meds: . aspirin EC  325 mg Oral Daily   Or  . aspirin  300 mg Rectal Daily  . clopidogrel  75 mg Oral Daily  . enoxaparin (LOVENOX) injection  40 mg Subcutaneous Q24H  . famotidine  20 mg Oral BID  . insulin aspart  0-15 Units Subcutaneous Q4H  . insulin detemir  25 Units Subcutaneous QHS  . lidocaine  1 patch Transdermal Q24H  . scopolamine  1 patch Transdermal Q72H  . simvastatin  40 mg Oral QHS   Continuous Infusions: . sodium chloride 50 mL/hr at 04/06/16 1244     LOS: 1 day    Time spent: Greater than 15 minutes   50% of the time spent on counseling and coordinating the care.   Manson Passey, MD [facility] Pager [phone]  If 7PM-7AM, please contact night-coverage [email or web address] Password [email or web address] 04/07/2016, 10:59 AM

## 2016-04-07 NOTE — Progress Notes (Signed)
All home and current medication and orders explained, reviewed and confirmed with patient and patients husband, who v/u. Patients family is very concerned about patient home meds and possible withdraw from long term use 10 mg baclofen PO daily as well as her Lyrica. Please advise family asap. Patient demonstrated no s/s of withdraw from [id number]. Thank you!

## 2016-04-07 NOTE — Telephone Encounter (Signed)
Records from [facility], received on 04/07/16. Notes on Katrina desk.

## 2016-04-07 NOTE — Evaluation (Signed)
Physical Therapy Evaluation Patient Details Name: Shelly Mcdaniel MRN: [id number] DOB: 17-Dec-1955 Today's Date: 04/07/2016   History of Present Illness  pt is a 60 yo female with h/o CVA August 2017 with LUE weakness, dysarthria and pain now admitted with AMS, waxing/waning confusion, strong urine odor.  Pt with h/o Prior C4-C7 ACDF.   Pt CXR negative.  MRI showed diffuse restriction right hemipons, middle cerebellar peduncles ischemia  Clinical Impression  Pt admitted with/for AMS found by MRI to have suffered another stroke.  Pt currently limited functionally due to the problems listed. ( See problems list.)   Pt will benefit from PT to maximize function and safety in order to get ready for next venue listed below.     Follow Up Recommendations CIR;Supervision/Assistance - 24 hour    Equipment Recommendations  None recommended by PT (TBA in CIR)    Recommendations for Other Services Rehab consult     Precautions / Restrictions Precautions Precautions: Fall      Mobility  Bed Mobility Overal bed mobility: Needs Assistance Bed Mobility: Rolling;Sidelying to Sit Rolling: Max assist Sidelying to sit: Max assist       General bed mobility comments: cues for direction, assist throughout transition.  assist to scoot   Transfers Overall transfer level: Needs assistance Equipment used: Ambulation equipment used Transfers: Sit to/from [employer] Sit to Stand: Mod assist Stand pivot transfers: Max assist       General transfer comment: needed forward and lifting assist, w/shift and pivoting assist.  truncal stability  Ambulation/Gait             General Gait Details: not tested today, transfer only  Stairs            Wheelchair Mobility    Modified Rankin (Stroke Patients Only) Modified Rankin (Stroke Patients Only) Pre-Morbid Rankin Score: Moderately severe disability Modified Rankin: Severe disability     Balance Overall balance  assessment: Needs assistance Sitting-balance support: Single extremity supported;No upper extremity supported Sitting balance-Leahy Scale: Fair Sitting balance - Comments: some list left     Standing balance-Leahy Scale: Poor Standing balance comment: needs external support, lists posteriorly,  Worked in standing 4-5 mins on w/shift and stepping.                             Pertinent Vitals/Pain Pain Assessment: Faces Faces Pain Scale: Hurts even more Pain Location: L shd/arm Pain Descriptors / Indicators: Grimacing;Guarding Pain Intervention(s): Monitored during session;Repositioned    Home Living Family/patient expects to be discharged to:: Private residence Living Arrangements: Spouse/significant other Available Help at Discharge: Family;Available 24 hours/day Type of Home: House Home Access: Ramped entrance     Home Layout: One level;Able to live on main level with bedroom/bathroom Home Equipment: Cane - quad;Bedside commode;Tub bench;Grab bars - tub/shower;Wheelchair - manual      Prior Function Level of Independence: Needs assistance   Gait / Transfers Assistance Needed: face to face assisted transfers           Hand Dominance        Extremity/Trunk Assessment   Upper Extremity Assessment:  (L UE stiff with moderate resistance to flexion/ext and painf)           Lower Extremity Assessment: RLE deficits/detail;LLE deficits/detail RLE Deficits / Details: WFL LLE Deficits / Details: Trace synergistic movement quads/hams, 2/5 hip flexors.  But shows more control of R knee in stance, with more guarding than support  Communication   Communication: Expressive difficulties  Cognition Arousal/Alertness: Awake/alert Behavior During Therapy: WFL for tasks assessed/performed Overall Cognitive Status: Impaired/Different from baseline                      General Comments      Exercises     Assessment/Plan    PT Assessment Patient  needs continued PT services  PT Problem List Decreased strength;Decreased activity tolerance;Decreased balance;Decreased mobility;Decreased coordination;Decreased knowledge of use of DME;Pain;Impaired tone          PT Treatment Interventions Gait training;DME instruction;Functional mobility training;Therapeutic activities;Therapeutic exercise;Balance training;Neuromuscular re-education;Patient/family education    PT Goals (Current goals can be found in the Care Plan section)  Acute Rehab PT Goals Patient Stated Goal: walking, go to rehab. PT Goal Formulation: With patient Time For Goal Achievement: 04/21/16 Potential to Achieve Goals: Good    Frequency Min 4X/week   Barriers to discharge        Co-evaluation               End of Session   Activity Tolerance: Patient tolerated treatment well Patient left: in chair;with call bell/phone within reach;with chair alarm set;with family/visitor present Nurse Communication: Mobility status         Time: 1127-1208 PT Time Calculation (min) (ACUTE ONLY): 41 min   Charges:   PT Evaluation $PT Eval Moderate Complexity: 1 Procedure PT Treatments $Therapeutic Activity: 23-37 mins   PT G Codes:        Rafaella Kole, Eliseo GumKenneth V 04/07/2016, 4:00 PM  04/07/2016  Kidder BingKen Chanay Nugent, PT [phone]  (pager)

## 2016-04-07 NOTE — Consult Note (Signed)
Physical Medicine and Rehabilitation Consult Reason for Consult: Right pons and bilateral cerebellar peduncle infarcts Referring Physician: [facility]   HPI: Shelly Mcdaniel is a 60 y.o. right handed female with history of hypertension, diabetes mellitus, history of falls, CVA August 2017 while vacationing at [facility] and received therapies at [facility] with residual left-sided weakness maintained on aspirin. Per chart review patient lives with spouse. Used a quad cane prior to admission. She had recently been receiving outpatient therapies. Presented 04/05/2016 with altered mental status and dysarthria. Patient had a fall in March with resultant persistent left rib cage pain and medications adjusted for pain started on baclofen in September, Lyrica in August had been on Neurontin without any significant dosage changes. Urine drug screen negative. In the ED patient tachycardic 110-1:30 systolic pressure in the 170s. CT scan negative for acute changes. MRI showed foci of diffusion restriction within the right hemi-pons and abnormal signal in the middle cerebellar peduncle most likely representing acute early subacute ischemia. No hemorrhage. MRA with no large vessel occlusion or aneurysm. Patient did not receive TPA. EEG with generalized slowing pattern nonspecific consistent with global encephalopathic process. No seizure activity noted. Neurology consulted presently on aspirin and Plavix for CVA prophylaxis. Tolerating a regular consistency diet. Formal physical and occupational therapy evaluations pending. M.D. has requested physical medicine rehabilitation consult.  According to son was using wheelchair as well as occasional walker at home since August 2017, CVA. Patient has been attending outpatient therapy at [facility] neuro rehabilitation Speech has declined since most recent stroke. 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 Review of Systems    Constitutional: Negative for chills and fever.  HENT: Negative for hearing loss.   Eyes: Negative for blurred vision and double vision.  Respiratory: Negative for cough and shortness of breath.   Cardiovascular: Negative for chest pain, palpitations and leg swelling.  Gastrointestinal: Positive for constipation. Negative for nausea and vomiting.  Genitourinary: Negative for dysuria and hematuria.  Musculoskeletal: Positive for back pain, falls, joint pain and myalgias.  Skin: Negative for rash.  Neurological: Positive for speech change and weakness. Negative for seizures, loss of consciousness and headaches.  Psychiatric/Behavioral: Positive for depression.  All other systems reviewed and are negative.  Past Medical History:  Diagnosis Date  . Diabetes mellitus without complication (HCC)   . Hyperlipidemia   . Hypertension    Past Surgical History:  Procedure Laterality Date  . BACK SURGERY    . BREAST SURGERY    . COLONOSCOPY WITH PROPOFOL N/A 12/24/2013   Procedure: COLONOSCOPY WITH PROPOFOL;  Surgeon: Charolett Bumpers, MD;  Location: [facility] ENDOSCOPY;  Service: Endoscopy;  Laterality: N/A;   Family History  Problem Relation Age of Onset  . Stroke Mother   . Dementia Mother   . Diabetes Father   . Stroke Father   . CAD Father   . Diabetes Sister   . Diabetes Brother   . CAD Brother   . Stroke Brother   . Kidney failure Brother   . Hypertension Other   . Hyperlipidemia Other   . Stroke Other   . Heart disease Other   . Diabetes Other    Social History:  reports that she has never smoked. She has never used smokeless tobacco. She reports that she does not drink alcohol or use drugs. Allergies:  Allergies  Allergen Reactions  . Bee Venom Anaphylaxis  . Codeine Itching  .  Lipitor [Atorvastatin] Other (See Comments)    Cramping   . Septra [Sulfamethoxazole-Trimethoprim]     Mother died from Stevens-Johnson syndrome and sister had anaphylaxis from Septra.Physician told  her never to take this medication.   Medications Prior to Admission  Medication Sig Dispense Refill  . aspirin EC 81 MG tablet Take 81 mg by mouth daily.    . baclofen (LIORESAL) 10 MG tablet Take 10 mg by mouth 2 (two) times daily.     Marland Kitchen docusate sodium (COLACE) 100 MG capsule Take 100 mg by mouth daily.    . insulin aspart (NOVOLOG FLEXPEN) 100 UNIT/ML FlexPen Inject 5 Units into the skin 3 (three) times daily with meals. (Patient taking differently: Inject 4 Units into the skin 2 (two) times daily before lunch and supper. ) 15 mL 11  . LEVEMIR FLEXTOUCH 100 UNIT/ML Pen Inject 25 Units into the skin every evening.     . lidocaine (LIDODERM) 5 % Place 1 patch onto the skin daily. Remove & Discard patch within 12 hours or as directed by MD    . lisinopril (PRINIVIL,ZESTRIL) 40 MG tablet Take 40 mg by mouth daily.    . Melatonin 3 MG TABS Take 3 mg by mouth at bedtime.    . metFORMIN (GLUCOPHAGE-XR) 500 MG 24 hr tablet Take 1,000 mg by mouth 2 (two) times daily.     . ondansetron (ZOFRAN) 4 MG tablet Take 1 tablet (4 mg total) by mouth every 8 (eight) hours as needed for nausea or vomiting. 20 tablet 0  . polyethylene glycol (MIRALAX / GLYCOLAX) packet Take 17 g by mouth daily as needed for moderate constipation.    . pregabalin (LYRICA) 75 MG capsule Take 75 mg by mouth 2 (two) times daily.    Marland Kitchen scopolamine (TRANSDERM-SCOP) 1 MG/3DAYS Place 1 patch onto the skin every 3 (three) days.    Marland Kitchen senna (SENOKOT) 8.6 MG tablet Take 1 tablet by mouth daily.    . sertraline (ZOLOFT) 50 MG tablet Take 50 mg by mouth at bedtime.     . simvastatin (ZOCOR) 40 MG tablet Take 40 mg by mouth at bedtime.    . traMADol (ULTRAM) 50 MG tablet Take 1 tablet (50 mg total) by mouth every 6 (six) hours as needed. 5 tablet 0  . cephALEXin (KEFLEX) 500 MG capsule Take 1 capsule (500 mg total) by mouth 2 (two) times daily. (Patient not taking: Reported on 04/05/2016) 40 capsule 0  . gabapentin (NEURONTIN) 100 MG capsule  Titrate dose as directed (Patient not taking: Reported on 04/05/2016) 90 capsule 1  . gabapentin (NEURONTIN) 300 MG capsule Titrate dose as directed (Patient not taking: Reported on 04/05/2016) 20 capsule 0  . quinapril-hydrochlorothiazide (ACCURETIC) 20-25 MG per tablet Take 1 tablet by mouth at bedtime.      Home: Home Living Living Arrangements: Spouse/significant other  Functional History:   Functional Status:  Mobility:          ADL:    Cognition: Cognition Orientation Level: Oriented to person, Oriented to place, Disoriented to time, Disoriented to situation    Blood pressure (!) 173/100, pulse (!) 102, temperature 98.2 F (36.8 C), temperature source Oral, resp. rate 16, height 5\' 8"  (1.727 m), weight 73.3 kg (161 lb 9.6 oz), SpO2 100 %. Physical Exam  Vitals reviewed. HENT:  Mild left facial droop  Eyes:  Pupils round and reactive to light  Neck: Normal range of motion. Neck supple. No thyromegaly present.  Cardiovascular: Normal rate and regular  rhythm.   Respiratory: Effort normal and breath sounds normal. No respiratory distress.  GI: Soft. Bowel sounds are normal. She exhibits no distension.  Neurological: She is alert.  Speech is dysarthric but intelligible. Followed simple commands. Appears to have some delay in processing.  Skin: Skin is warm and dry.  Sparse verbal output, says goodbye and hello. Follows simple commands after delay. Motor strength is 4/5 in the right deltoid, biceps, triceps, grip, 4/5 in the right hip flexor, knee extensor, ankle dorsiflexor. Trace left elbow flexion. 2 minus, left hip, knee extensor synergy Sensation reported as intact to light touch in both upper and lower limbs. Patient has incomplete left eye abduction on left lateral gaze. There is evidence of facial droop, left central 7 No evidence of nystagmus Modified Ashworth score 3 at the left elbow flexors, finger flexors and wrist flexors Left shoulder pain with  external rotation as well as with abduction. Increased tone at the left shoulder adductor's Results for orders placed or performed during the hospital encounter of 04/05/16 (from the past 24 hour(s))  Glucose, capillary     Status: Abnormal   Collection Time: 04/06/16  4:23 PM  Result Value Ref Range   Glucose-Capillary 102 (H) 65 - 99 mg/dL  Glucose, capillary     Status: Abnormal   Collection Time: 04/06/16  7:36 PM  Result Value Ref Range   Glucose-Capillary 165 (H) 65 - 99 mg/dL   Comment 1 Notify RN    Comment 2 Document in Chart   Glucose, capillary     Status: Abnormal   Collection Time: 04/06/16 11:37 PM  Result Value Ref Range   Glucose-Capillary 111 (H) 65 - 99 mg/dL   Comment 1 Notify RN    Comment 2 Document in Chart   Lipid panel     Status: Abnormal   Collection Time: 04/07/16  3:17 AM  Result Value Ref Range   Cholesterol 164 0 - 200 mg/dL   Triglycerides 161 <096 mg/dL   HDL 29 (L) >04 mg/dL   Total CHOL/HDL Ratio 5.7 RATIO   VLDL 25 0 - 40 mg/dL   LDL Cholesterol 540 (H) 0 - 99 mg/dL  Vitamin J81     Status: None   Collection Time: 04/07/16  3:17 AM  Result Value Ref Range   Vitamin B-12 320 180 - 914 pg/mL  Folate     Status: None   Collection Time: 04/07/16  3:17 AM  Result Value Ref Range   Folate 21.7 >5.9 ng/mL  Glucose, capillary     Status: Abnormal   Collection Time: 04/07/16  3:40 AM  Result Value Ref Range   Glucose-Capillary 125 (H) 65 - 99 mg/dL   Comment 1 Notify RN    Comment 2 Document in Chart   Glucose, capillary     Status: Abnormal   Collection Time: 04/07/16  7:58 AM  Result Value Ref Range   Glucose-Capillary 130 (H) 65 - 99 mg/dL  Glucose, capillary     Status: Abnormal   Collection Time: 04/07/16 11:28 AM  Result Value Ref Range   Glucose-Capillary 168 (H) 65 - 99 mg/dL   Dg Chest 2 View  Result Date: 04/05/2016 CLINICAL DATA:  60 year old female with altered mental status for the past several days. EXAM: CHEST  2 VIEW  COMPARISON:  Chest x-ray 08/20/2015. FINDINGS: Lung volumes are normal. No consolidative airspace disease. No pleural effusions. No pneumothorax. No pulmonary nodule or mass noted. Pulmonary vasculature and the cardiomediastinal silhouette are  within normal limits. Orthopedic fixation hardware in the lower cervical spine incidentally noted. IMPRESSION: No radiographic evidence of acute cardiopulmonary disease. Electronically Signed   By: Trudie Reedaniel  Entrikin M.D.   On: 04/05/2016 17:14   Mr Shirlee LatchMra Head LKWo Contrast  Result Date: 04/05/2016 CLINICAL DATA:  60 y/o F; history of stroke with residual left-sided facial weakness, arm weakness, and leg weakness presenting with worsening confusion. EXAM: MRI HEAD WITHOUT CONTRAST MRA HEAD WITHOUT CONTRAST TECHNIQUE: Multiplanar, multiecho pulse sequences of the brain and surrounding structures were obtained without intravenous contrast. Angiographic images of the head were obtained using MRA technique without contrast. COMPARISON:  04/05/2016 CT head.  11/08/2014 MRI of the brain. FINDINGS: MRI HEAD FINDINGS Brain: There are foci of diffusion restriction within the right hemipons and within the bilateral middle cerebral peduncles. Areas of diffusion restriction are associated mild T2 FLAIR hyperintensity. There is a background of nonspecific foci of T2 FLAIR hyperintensity in subcortical and periventricular white matter with frontal parietal predominance compatible with moderate chronic microvascular ischemic changes. There is a chronic lacunar infarct within the left lentiform nucleus extending into the corona radiata. No abnormal susceptibility hypointensity to indicate intracranial hemorrhage. Ventricle size is normal. No extra-axial collection. Vascular: See below. Skull and upper cervical spine: Normal marrow signal. Sinuses/Orbits: There is mucosal thickening within the anterior ethmoid air cells and the right frontal sinus. There is opacification of the right mastoid  tip. Orbits are unremarkable. Other: None. MRA HEAD FINDINGS Anterior circulation: Irregularity of the cavernous and paraclinoid segments of internal carotid arteries bilaterally with mild stenosis. Bilateral MCA and ACA are patent without high-grade stenosis, aneurysm, or occlusion. Posterior circulation: Unchanged diminutive right vertebral artery and left dominant vertebrobasilar system. Irregularity of the basilar artery with moderate to severe short segment of distal stenosis that is worsened in comparison with the prior MRI. Bilateral posterior cerebral arteries are patent without high-grade stenosis, aneurysm, or occlusion. Anatomic variant: Left fetal posterior cerebral artery with diminutive P1 segment. No right posterior communicating artery identified, likely hypoplastic or absent. Probable diminutive anterior communicating artery IMPRESSION: 1. Foci of diffusion restriction within the right hemipons and abnormal signal in the middle cerebellar peduncles probably represent areas of acute/early subacute ischemia. No hemorrhage identified. 2. 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 3. Otherwise the circle of Willis is patent with atherosclerotic changes and short segments of mild stenosis in the anterior and posterior circulation without large vessel occlusion or aneurysm. 4. Paranasal sinus disease predominantly in anterior ethmoid and right frontal sinuses. 5. Stable moderate chronic microvascular ischemic changes of the brain parenchyma. These results were called by telephone at the time of interpretation on 04/05/2016 at 6:25 pm to Dr. Erma HeritageIsaacs, who verbally acknowledged these results. Electronically Signed   By: Mitzi HansenLance  Furusawa-Stratton M.D.   On: 04/05/2016 18:28   Mr Brain Wo Contrast  Result Date: 04/05/2016 CLINICAL DATA:  60 y/o F; history of stroke with residual left-sided facial weakness, arm weakness, and leg weakness presenting with worsening confusion.  EXAM: MRI HEAD WITHOUT CONTRAST MRA HEAD WITHOUT CONTRAST TECHNIQUE: Multiplanar, multiecho pulse sequences of the brain and surrounding structures were obtained without intravenous contrast. Angiographic images of the head were obtained using MRA technique without contrast. COMPARISON:  04/05/2016 CT head.  11/08/2014 MRI of the brain. FINDINGS: MRI HEAD FINDINGS Brain: There are foci of diffusion restriction within the right hemipons and within the bilateral middle cerebral peduncles. Areas of diffusion restriction are associated mild T2 FLAIR hyperintensity. There is a background  of nonspecific foci of T2 FLAIR hyperintensity in subcortical and periventricular white matter with frontal parietal predominance compatible with moderate chronic microvascular ischemic changes. There is a chronic lacunar infarct within the left lentiform nucleus extending into the corona radiata. No abnormal susceptibility hypointensity to indicate intracranial hemorrhage. Ventricle size is normal. No extra-axial collection. Vascular: See below. Skull and upper cervical spine: Normal marrow signal. Sinuses/Orbits: There is mucosal thickening within the anterior ethmoid air cells and the right frontal sinus. There is opacification of the right mastoid tip. Orbits are unremarkable. Other: None. MRA HEAD FINDINGS Anterior circulation: Irregularity of the cavernous and paraclinoid segments of internal carotid arteries bilaterally with mild stenosis. Bilateral MCA and ACA are patent without high-grade stenosis, aneurysm, or occlusion. Posterior circulation: Unchanged diminutive right vertebral artery and left dominant vertebrobasilar system. Irregularity of the basilar artery with moderate to severe short segment of distal stenosis that is worsened in comparison with the prior MRI. Bilateral posterior cerebral arteries are patent without high-grade stenosis, aneurysm, or occlusion. Anatomic variant: Left fetal posterior cerebral artery with  diminutive P1 segment. No right posterior communicating artery identified, likely hypoplastic or absent. Probable diminutive anterior communicating artery IMPRESSION: 1. Foci of diffusion restriction within the right hemipons and abnormal signal in the middle cerebellar peduncles probably represent areas of acute/early subacute ischemia. No hemorrhage identified. 2. 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 3. Otherwise the circle of Willis is patent with atherosclerotic changes and short segments of mild stenosis in the anterior and posterior circulation without large vessel occlusion or aneurysm. 4. Paranasal sinus disease predominantly in anterior ethmoid and right frontal sinuses. 5. Stable moderate chronic microvascular ischemic changes of the brain parenchyma. These results were called by telephone at the time of interpretation on 04/05/2016 at 6:25 pm to Dr. Erma Heritage, who verbally acknowledged these results. Electronically Signed   By: Mitzi Hansen M.D.   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 Does the need for close, 24 hr/day medical supervision in concert with the patient's rehab needs make it unreasonable for this patient to be served in a less intensive setting? Yes 2. Co-Morbidities requiring supervision/potential complications: Diabetes, hypertension 3. Due to bladder management, bowel management, safety, skin/wound care, disease management, medication administration, pain management and patient education, does the patient require 24 hr/day rehab nursing? Yes 4. Does the patient require coordinated care of a physician, rehab nurse, PT (1-2 hrs/day, 5 days/week), OT (1-2 hrs/day, 5 days/week) and SLP (.5-1 hrs/day, 5 days/week) to address physical and functional deficits in the context of the above medical diagnosis(es)? Yes Addressing deficits in the following  areas: balance, endurance, locomotion, strength, transferring, bowel/bladder control, bathing, dressing, feeding, grooming, toileting, cognition and psychosocial support 5. Can the patient actively participate in an intensive therapy program of at least 3 hrs of therapy per day at least 5 days per week? Yes 6. The potential for patient to make measurable gains while on inpatient rehab is good 7. Anticipated functional outcomes upon discharge from inpatient rehab are min assist  with PT, min assist with OT, min assist with SLP. 8. Estimated rehab length of stay to reach the above functional goals is: 18-22 days 9. Does the patient have adequate social supports and living environment to accommodate these discharge functional goals? Yes 10. Anticipated D/C setting: Home 11. Anticipated post D/C treatments: Outpatient therapy 12. Overall Rehab/Functional Prognosis: good  RECOMMENDATIONS: This patient's condition is appropriate  for continued rehabilitative care in the following setting: CIR Patient has agreed to participate in recommended program. Potentially Note that insurance prior authorization may be required for reimbursement for recommended care.  Comment: May need Botox injection while in hospital for left elbow, wrist and finger flexor spasticity    04/07/2016

## 2016-04-08 ENCOUNTER — Inpatient Hospital Stay (HOSPITAL_COMMUNITY)
Admission: RE | Admit: 2016-04-08 | Discharge: 2016-05-01 | DRG: 092 | Disposition: A | Discharge status: Home Health | Payer: BC Managed Care – PPO | Source: Intra-hospital | Attending: Physical Medicine & Rehabilitation | Admitting: Physical Medicine & Rehabilitation

## 2016-04-08 DIAGNOSIS — Z888 Allergy status to other drugs, medicaments and biological substances status: Secondary | ICD-10-CM

## 2016-04-08 DIAGNOSIS — N390 Urinary tract infection, site not specified: Secondary | ICD-10-CM | POA: Diagnosis not present

## 2016-04-08 DIAGNOSIS — Z885 Allergy status to narcotic agent status: Secondary | ICD-10-CM

## 2016-04-08 DIAGNOSIS — R252 Cramp and spasm: Secondary | ICD-10-CM

## 2016-04-08 DIAGNOSIS — E876 Hypokalemia: Secondary | ICD-10-CM | POA: Diagnosis not present

## 2016-04-08 DIAGNOSIS — I651 Occlusion and stenosis of basilar artery: Secondary | ICD-10-CM | POA: Diagnosis present

## 2016-04-08 DIAGNOSIS — B9620 Unspecified Escherichia coli [E. coli] as the cause of diseases classified elsewhere: Secondary | ICD-10-CM | POA: Diagnosis not present

## 2016-04-08 DIAGNOSIS — I10 Essential (primary) hypertension: Secondary | ICD-10-CM | POA: Diagnosis present

## 2016-04-08 DIAGNOSIS — I951 Orthostatic hypotension: Secondary | ICD-10-CM | POA: Diagnosis present

## 2016-04-08 DIAGNOSIS — I69354 Hemiplegia and hemiparesis following cerebral infarction affecting left non-dominant side: Secondary | ICD-10-CM

## 2016-04-08 DIAGNOSIS — Z9181 History of falling: Secondary | ICD-10-CM | POA: Diagnosis not present

## 2016-04-08 DIAGNOSIS — I6350 Cerebral infarction due to unspecified occlusion or stenosis of unspecified cerebral artery: Secondary | ICD-10-CM

## 2016-04-08 DIAGNOSIS — E785 Hyperlipidemia, unspecified: Secondary | ICD-10-CM | POA: Diagnosis present

## 2016-04-08 DIAGNOSIS — G8114 Spastic hemiplegia affecting left nondominant side: Secondary | ICD-10-CM | POA: Diagnosis not present

## 2016-04-08 DIAGNOSIS — R0789 Other chest pain: Secondary | ICD-10-CM

## 2016-04-08 DIAGNOSIS — Z91030 Bee allergy status: Secondary | ICD-10-CM | POA: Diagnosis not present

## 2016-04-08 DIAGNOSIS — E1142 Type 2 diabetes mellitus with diabetic polyneuropathy: Secondary | ICD-10-CM | POA: Diagnosis present

## 2016-04-08 DIAGNOSIS — K5900 Constipation, unspecified: Secondary | ICD-10-CM | POA: Diagnosis present

## 2016-04-08 DIAGNOSIS — R1110 Vomiting, unspecified: Secondary | ICD-10-CM

## 2016-04-08 DIAGNOSIS — E1121 Type 2 diabetes mellitus with diabetic nephropathy: Secondary | ICD-10-CM

## 2016-04-08 DIAGNOSIS — Z4659 Encounter for fitting and adjustment of other gastrointestinal appliance and device: Secondary | ICD-10-CM

## 2016-04-08 DIAGNOSIS — Z0189 Encounter for other specified special examinations: Secondary | ICD-10-CM

## 2016-04-08 DIAGNOSIS — F419 Anxiety disorder, unspecified: Secondary | ICD-10-CM | POA: Diagnosis present

## 2016-04-08 DIAGNOSIS — I69322 Dysarthria following cerebral infarction: Secondary | ICD-10-CM

## 2016-04-08 DIAGNOSIS — Z7982 Long term (current) use of aspirin: Secondary | ICD-10-CM

## 2016-04-08 DIAGNOSIS — Z79899 Other long term (current) drug therapy: Secondary | ICD-10-CM | POA: Diagnosis not present

## 2016-04-08 DIAGNOSIS — Z794 Long term (current) use of insulin: Secondary | ICD-10-CM

## 2016-04-08 DIAGNOSIS — R1310 Dysphagia, unspecified: Secondary | ICD-10-CM

## 2016-04-08 DIAGNOSIS — Z09 Encounter for follow-up examination after completed treatment for conditions other than malignant neoplasm: Secondary | ICD-10-CM

## 2016-04-08 DIAGNOSIS — R2689 Other abnormalities of gait and mobility: Principal | ICD-10-CM | POA: Diagnosis present

## 2016-04-08 DIAGNOSIS — E1165 Type 2 diabetes mellitus with hyperglycemia: Secondary | ICD-10-CM | POA: Diagnosis not present

## 2016-04-08 LAB — URINALYSIS, ROUTINE W REFLEX MICROSCOPIC
Glucose, UA: NEGATIVE mg/dL
Hgb urine dipstick: NEGATIVE
Ketones, ur: 15 mg/dL — AB
LEUKOCYTES UA: NEGATIVE
NITRITE: NEGATIVE
PROTEIN: NEGATIVE mg/dL
Specific Gravity, Urine: 1.022 (ref 1.005–1.030)
pH: 5.5 (ref 5.0–8.0)

## 2016-04-08 LAB — GLUCOSE, CAPILLARY
GLUCOSE-CAPILLARY: 103 mg/dL — AB (ref 65–99)
GLUCOSE-CAPILLARY: 112 mg/dL — AB (ref 65–99)
GLUCOSE-CAPILLARY: 126 mg/dL — AB (ref 65–99)
Glucose-Capillary: 112 mg/dL — ABNORMAL HIGH (ref 65–99)
Glucose-Capillary: 132 mg/dL — ABNORMAL HIGH (ref 65–99)
Glucose-Capillary: 136 mg/dL — ABNORMAL HIGH (ref 65–99)

## 2016-04-08 LAB — BASIC METABOLIC PANEL
ANION GAP: 11 (ref 5–15)
BUN: 14 mg/dL (ref 6–20)
CALCIUM: 9.8 mg/dL (ref 8.9–10.3)
CHLORIDE: 104 mmol/L (ref 101–111)
CO2: 24 mmol/L (ref 22–32)
CREATININE: 0.73 mg/dL (ref 0.44–1.00)
GFR calc Af Amer: >60 mL/min (ref >60)
GFR calc non Af Amer: >60 mL/min (ref >60)
GLUCOSE: 160 mg/dL — AB (ref 65–99)
Potassium: 3.5 mmol/L (ref 3.5–5.1)
Sodium: 139 mmol/L (ref 135–145)

## 2016-04-08 LAB — CBC
HEMATOCRIT: 36.5 % (ref 36.0–46.0)
Hemoglobin: 12.8 g/dL (ref 12.0–15.0)
MCH: 30.4 pg (ref 26.0–34.0)
MCHC: 35.1 g/dL (ref 30.0–36.0)
MCV: 86.7 fL (ref 78.0–100.0)
Platelets: 189 10*3/uL (ref 150–400)
RBC: 4.21 MIL/uL (ref 3.87–5.11)
RDW: 13.2 % (ref 11.5–15.5)
WBC: 5.1 10*3/uL (ref 4.0–10.5)

## 2016-04-08 LAB — CREATININE, SERUM
Creatinine, Ser: 0.68 mg/dL (ref 0.44–1.00)
GFR calc non Af Amer: >60 mL/min (ref >60)

## 2016-04-08 LAB — MAGNESIUM: Magnesium: 1.7 mg/dL (ref 1.7–2.4)

## 2016-04-08 MED ORDER — BACLOFEN 10 MG PO TABS
10.0000 mg | ORAL_TABLET | Freq: Three times a day (TID) | ORAL | Status: DC
  Administered 2016-04-09 – 2016-04-11 (×6): 10 mg via ORAL
  Filled 2016-04-08 (×7): qty 1

## 2016-04-08 MED ORDER — CLOPIDOGREL BISULFATE 75 MG PO TABS
75.0000 mg | ORAL_TABLET | Freq: Every day | ORAL | Status: DC
Start: 2016-04-09 — End: 2016-05-01
  Administered 2016-04-09 – 2016-05-01 (×23): 75 mg via ORAL
  Filled 2016-04-08 (×23): qty 1

## 2016-04-08 MED ORDER — METHOCARBAMOL 500 MG PO TABS
500.0000 mg | ORAL_TABLET | Freq: Four times a day (QID) | ORAL | Status: DC | PRN
  Filled 2016-04-08: qty 1

## 2016-04-08 MED ORDER — ACETAMINOPHEN 160 MG/5ML PO SOLN: 650.0000 mg | Freq: Four times a day (QID) | ORAL | Status: DC | PRN

## 2016-04-08 MED ORDER — SERTRALINE HCL 50 MG PO TABS
50.0000 mg | ORAL_TABLET | Freq: Every day | ORAL | Status: DC
  Administered 2016-04-09 – 2016-05-01 (×23): 50 mg via ORAL
  Filled 2016-04-08 (×24): qty 1

## 2016-04-08 MED ORDER — CLOPIDOGREL BISULFATE 75 MG PO TABS: 75.0000 mg | ORAL_TABLET | Freq: Every day | ORAL | 0 refills | Status: DC

## 2016-04-08 MED ORDER — FAMOTIDINE IN NACL 20-0.9 MG/50ML-% IV SOLN
20.0000 mg | Freq: Two times a day (BID) | INTRAVENOUS | Status: DC
  Administered 2016-04-09: 20 mg via INTRAVENOUS
  Filled 2016-04-08 (×3): qty 50

## 2016-04-08 MED ORDER — ACETAMINOPHEN 650 MG RE SUPP
650.0000 mg | Freq: Four times a day (QID) | RECTAL | Status: DC | PRN
  Administered 2016-04-08: 650 mg via RECTAL
  Filled 2016-04-08: qty 1

## 2016-04-08 MED ORDER — POTASSIUM CHLORIDE CRYS ER 20 MEQ PO TBCR
40.0000 meq | EXTENDED_RELEASE_TABLET | Freq: Once | ORAL | Status: AC
  Administered 2016-04-08: 40 meq via ORAL
  Filled 2016-04-08: qty 2

## 2016-04-08 MED ORDER — SORBITOL 70 % SOLN
30.0000 mL | Freq: Every day | Status: DC | PRN
  Administered 2016-04-10 – 2016-04-17 (×2): 30 mL via ORAL
  Filled 2016-04-08 (×2): qty 30

## 2016-04-08 MED ORDER — ACETAMINOPHEN 325 MG PO TABS: 650.0000 mg | ORAL_TABLET | Freq: Four times a day (QID) | ORAL | Status: DC | PRN

## 2016-04-08 MED ORDER — BACLOFEN 10 MG PO TABS: 10.0000 mg | ORAL_TABLET | Freq: Three times a day (TID) | ORAL | 0 refills | Status: DC

## 2016-04-08 MED ORDER — ENOXAPARIN SODIUM 40 MG/0.4ML ~~LOC~~ SOLN
40.0000 mg | SUBCUTANEOUS | Status: DC
  Administered 2016-04-08 – 2016-04-30 (×23): 40 mg via SUBCUTANEOUS
  Filled 2016-04-08 (×21): qty 0.4

## 2016-04-08 MED ORDER — ACETAMINOPHEN 650 MG RE SUPP
650.0000 mg | Freq: Four times a day (QID) | RECTAL | Status: DC | PRN
  Administered 2016-04-08 – 2016-04-10 (×4): 650 mg via RECTAL
  Filled 2016-04-08 (×5): qty 1

## 2016-04-08 MED ORDER — AMLODIPINE BESYLATE 5 MG PO TABS
5.0000 mg | ORAL_TABLET | Freq: Every day | ORAL | Status: DC
  Administered 2016-04-09 – 2016-05-01 (×23): 5 mg via ORAL
  Filled 2016-04-08 (×23): qty 1

## 2016-04-08 MED ORDER — POLYETHYLENE GLYCOL 3350 17 G PO PACK
17.0000 g | PACK | Freq: Every day | ORAL | Status: DC
  Administered 2016-04-09 – 2016-04-10 (×2): 17 g via ORAL
  Filled 2016-04-08 (×2): qty 1

## 2016-04-08 MED ORDER — LIDOCAINE 5 % EX PTCH
1.0000 | MEDICATED_PATCH | CUTANEOUS | Status: DC
  Administered 2016-04-08 – 2016-04-12 (×5): 1 via TRANSDERMAL
  Filled 2016-04-08 (×5): qty 1

## 2016-04-08 MED ORDER — ASPIRIN 325 MG PO TBEC
325.0000 mg | DELAYED_RELEASE_TABLET | Freq: Every day | ORAL | 0 refills | Status: DC
Start: 2016-04-09 — End: 2016-08-31

## 2016-04-08 MED ORDER — ONDANSETRON HCL 4 MG/2ML IJ SOLN
4.0000 mg | Freq: Four times a day (QID) | INTRAMUSCULAR | Status: DC | PRN
  Administered 2016-04-09 – 2016-04-21 (×5): 4 mg via INTRAVENOUS
  Filled 2016-04-08 (×5): qty 2

## 2016-04-08 MED ORDER — INSULIN DETEMIR 100 UNIT/ML ~~LOC~~ SOLN
25.0000 [IU] | Freq: Every day | SUBCUTANEOUS | Status: DC
  Filled 2016-04-08 (×2): qty 0.25

## 2016-04-08 MED ORDER — ENOXAPARIN SODIUM 40 MG/0.4ML ~~LOC~~ SOLN: 40.0000 mg | SUBCUTANEOUS | Status: DC

## 2016-04-08 MED ORDER — TRAMADOL HCL 50 MG PO TABS: 50.0000 mg | ORAL_TABLET | Freq: Four times a day (QID) | ORAL | 0 refills | Status: DC | PRN

## 2016-04-08 MED ORDER — ROSUVASTATIN CALCIUM 5 MG PO TABS
5.0000 mg | ORAL_TABLET | Freq: Every day | ORAL | Status: DC
  Administered 2016-04-09 – 2016-04-30 (×22): 5 mg via ORAL
  Filled 2016-04-08 (×22): qty 1

## 2016-04-08 MED ORDER — ONDANSETRON HCL 4 MG PO TABS
4.0000 mg | ORAL_TABLET | Freq: Four times a day (QID) | ORAL | Status: DC | PRN
  Administered 2016-04-13 – 2016-04-26 (×4): 4 mg via ORAL
  Filled 2016-04-08 (×5): qty 1

## 2016-04-08 MED ORDER — KETOROLAC TROMETHAMINE 15 MG/ML IJ SOLN
15.0000 mg | Freq: Once | INTRAMUSCULAR | Status: AC
  Administered 2016-04-08: 15 mg via INTRAVENOUS
  Filled 2016-04-08: qty 1

## 2016-04-08 MED ORDER — MAGNESIUM SULFATE IN D5W 1-5 GM/100ML-% IV SOLN
1.0000 g | Freq: Once | INTRAVENOUS | Status: AC
Start: 2016-04-08 — End: 2016-04-08
  Administered 2016-04-08: 1 g via INTRAVENOUS
  Filled 2016-04-08 (×2): qty 100

## 2016-04-08 MED ORDER — INSULIN ASPART 100 UNIT/ML ~~LOC~~ SOLN
0.0000 [IU] | Freq: Three times a day (TID) | SUBCUTANEOUS | Status: DC
  Administered 2016-04-09 (×2): 3 [IU] via SUBCUTANEOUS
  Administered 2016-04-09: 2 [IU] via SUBCUTANEOUS

## 2016-04-08 MED ORDER — ASPIRIN EC 325 MG PO TBEC: 325.0000 mg | DELAYED_RELEASE_TABLET | Freq: Every day | ORAL | Status: DC

## 2016-04-08 MED ORDER — METHOCARBAMOL 500 MG PO TABS: 500.0000 mg | ORAL_TABLET | Freq: Three times a day (TID) | ORAL | 0 refills | Status: DC | PRN

## 2016-04-08 MED ORDER — LORAZEPAM 2 MG/ML IJ SOLN
0.5000 mg | INTRAMUSCULAR | Status: DC | PRN
  Administered 2016-04-09: 0.5 mg via INTRAMUSCULAR
  Filled 2016-04-08: qty 1

## 2016-04-08 MED ORDER — LORAZEPAM 0.5 MG PO TABS
0.5000 mg | ORAL_TABLET | ORAL | Status: DC | PRN
  Filled 2016-04-08: qty 1

## 2016-04-08 MED ORDER — FAMOTIDINE 20 MG PO TABS
20.0000 mg | ORAL_TABLET | Freq: Two times a day (BID) | ORAL | Status: DC
  Filled 2016-04-08: qty 1

## 2016-04-08 MED ORDER — SIMVASTATIN 40 MG PO TABS
40.0000 mg | ORAL_TABLET | Freq: Every day | ORAL | Status: DC
  Filled 2016-04-08: qty 1

## 2016-04-08 MED ORDER — INSULIN ASPART 100 UNIT/ML ~~LOC~~ SOLN: 0.0000 [IU] | SUBCUTANEOUS | Status: DC

## 2016-04-08 MED ORDER — SCOPOLAMINE 1 MG/3DAYS TD PT72
1.0000 | MEDICATED_PATCH | TRANSDERMAL | Status: DC
  Administered 2016-04-08 – 2016-04-29 (×8): 1.5 mg via TRANSDERMAL
  Filled 2016-04-08 (×8): qty 1

## 2016-04-08 MED ORDER — ASPIRIN 300 MG RE SUPP: 300.0000 mg | Freq: Every day | RECTAL | Status: DC

## 2016-04-08 NOTE — Discharge Summary (Signed)
Physician Discharge Summary  Shelly Mcdaniel:[id number] DOB: 11/20/1955 DOA: 04/05/2016  PCP: Ginette Otto, MD  Admit date: 04/05/2016 Discharge date: 04/08/2016  Admitted From: home Disposition:  CIR  Recommendations for Outpatient Follow-up:  1. Follow up at inpt Rehab 2.  follow up with Dr Roda Shutters in 2 months 3. Please keep head of bed elevated at 30 and apply TED stockings. Allow permissive blood pressure given her stroke and orthostatic hypotension. Check orthostatic blood pressure daily. 4. DAPT with ASA and plavix for 3 months then Plavix alone.  Equipment/Devices:As per therapy at CIR  Discharge Condition: Fair CODE STATUS: Full code Diet recommendation: Heart healthy/ carb modified    Discharge Diagnoses:  Principal Problem:   Cerebral thrombosis with cerebral infarction   Active Problems:   Basilar artery stenosis   Uncontrolled hypertension   DM II (diabetes mellitus, type II), controlled (HCC)   Dysarthria   History of CVA with residual deficit   Sinus tachycardia   Acute encephalopathy   Chronic pain   Hyperlipidemia   Orthostatic hypotension   Muscle cramps  Brief narrative stress history of present illness Reason for to admission H&P for details, in brief, 60 -year-old female with past medical history significant for CVA in August 2017 at outside hospital (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 presented to [facility] with worsening mental status changes, decreased level of alertness and difficulty expressing herself. On admission, she was hypertensive with blood pressure was 182/107. blood work was relatively unremarkable. CT head showed no acute intracranial findings. MRI brain showed foci of diffusion restriction within the right hemipons and abnormal signal in the middle cerebellar peduncles probably represent areas of acute/early subacute ischemia. No hemorrhage  identified. 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  Hospital course  Principal Problem:    acute encephalopathy secondary to acute CVA, basilar artery stenosis Patient was on 81 mg aspirin prior to admission, now on aspirin 325 mg and Plavix 75 mg daily. Recommend DPT for 3 months and then Plavix alone. MRI brain findings as below. moderate-to-severe. - Dr. Roda Shutters has reviewed outside hospital 2-D echo and carotid Doppler from 01/2016 which were unremarkable.- HgbA1c is 6.7 - Lipid panel -  LDL 110; goal < 100. Continue simvastatin. -Seen by PT/OT and recommend CIR. Patient has been accepted. Follow up with Dr Roda Shutters in 2 months.  Active Problems: Orthostatic hypotension Severely orthostatic on admission, allowing permissive blood pressure. Still orthostatic this morning but much improved. Continue lisinopril. Hold other blood pressure medications. Continue TED stockings, head of bed elevation. Monitor orthostasis daily. Avoid midodrine or Florinef given high supine blood pressure.    Uncontrolled hypertension - Continue lisinopril. Will hold blood pressure medications given persistent orthostasis (although improving). Allow permissive blood pressure for at least 5 days.  Orthostatic hypotension Significant on admission, now improving.    DM II (diabetes mellitus, type II), controlled with circulatory complications with long term insulin use (HCC) - A1c 6.7 . CBG stable. - Continue Levemir 25 units at bedtime and sliding scale insulin     Dyslipidemia associated with type 2 diabetes  - Continue simvastatin  Leg spasms Replenished low potassium and magnesium. Resumed home dose baclofen. Apparent dose increased to 3 times a day) Added as needed Robaxin. Continue Lyrica.  Family communication: Husband and son at bedside   disposition: CIR   Discharge Instructions  Discharge Instructions    Ambulatory referral to Neurology    Complete by:  As  directed    Pt will follow up with Dr. Roda [facility]ters at [facility] in about 2 months. Thanks.       Medication List    STOP taking these medications   cephALEXin 500 MG capsule Commonly known as:  KEFLEX   quinapril-hydrochlorothiazide 20-25 MG tablet Commonly known as:  ACCURETIC  gabapentin 100 MG capsule     TAKE these medications   aspirin 325 MG EC tablet Take 1 tablet (325 mg total) by mouth daily. Start taking on:  04/09/2016 What changed:  medication strength  how much to take   baclofen 10 MG tablet Commonly known as:  LIORESAL Take 1 tablet (10 mg total) by mouth 3 (three) times daily. What changed:  when to take this   clopidogrel 75 MG tablet Commonly known as:  PLAVIX Take 1 tablet (75 mg total) by mouth daily. Start taking on:  04/09/2016   docusate sodium 100 MG capsule Commonly known as:  COLACE Take 100 mg by mouth daily.      insulin aspart 100 UNIT/ML FlexPen Commonly known as:  NOVOLOG FLEXPEN Inject 5 Units into the skin 3 (three) times daily with meals. What changed:  how much to take  when to take this   LEVEMIR FLEXTOUCH 100 UNIT/ML Pen Generic drug:  Insulin Detemir Inject 25 Units into the skin every evening.   lidocaine 5 % Commonly known as:  LIDODERM Place 1 patch onto the skin daily. Remove & Discard patch within 12 hours or as directed by MD   lisinopril 40 MG tablet Commonly known as:  PRINIVIL,ZESTRIL Take 40 mg by mouth daily.   Melatonin 3 MG Tabs Take 3 mg by mouth at bedtime.   metFORMIN 500 MG 24 hr tablet Commonly known as:  GLUCOPHAGE-XR Take 1,000 mg by mouth 2 (two) times daily.   methocarbamol 500 MG tablet Commonly known as:  ROBAXIN Take 1 tablet (500 mg total) by mouth every 8 (eight) hours as needed for muscle spasms.   ondansetron 4 MG tablet Commonly known as:  ZOFRAN Take 1 tablet (4 mg total) by mouth every 8 (eight) hours as needed for nausea or vomiting.   polyethylene glycol packet Commonly known as:   MIRALAX / GLYCOLAX Take 17 g by mouth daily as needed for moderate constipation.   pregabalin 75 MG capsule Commonly known as:  LYRICA Take 75 mg by mouth 2 (two) times daily.   scopolamine 1 MG/3DAYS Commonly known as:  TRANSDERM-SCOP Place 1 patch onto the skin every 3 (three) days.   senna 8.6 MG tablet Commonly known as:  SENOKOT Take 1 tablet by mouth daily.   sertraline 50 MG tablet Commonly known as:  ZOLOFT Take 50 mg by mouth at bedtime.   simvastatin 40 MG tablet Commonly known as:  ZOCOR Take 40 mg by mouth at bedtime.   traMADol 50 MG tablet Commonly known as:  ULTRAM Take 1 tablet (50 mg total) by mouth every 6 (six) hours as needed.      Follow-up Information    Xu,Jindong, MD. Schedule an appointment as soon as possible for a visit in 6 week(s).   Specialty:  Neurology Contact information: [address] [place] [address] [phone]          Allergies  Allergen Reactions  . Bee Venom Anaphylaxis  . Codeine Itching  . Lipitor [Atorvastatin] Other (See Comments)    Cramping   . Septra [Sulfamethoxazole-Trimethoprim]     Mother died from Stevens-Johnson  syndrome and sister had anaphylaxis from Septra.Physician told her never to take this medication.        Procedures/Studies: Dg Chest 2 View  Result Date: 04/05/2016 CLINICAL DATA:  60 year old female with altered mental status for the past several days. EXAM: CHEST  2 VIEW COMPARISON:  Chest x-ray 08/20/2015. FINDINGS: Lung volumes are normal. No consolidative airspace disease. No pleural effusions. No pneumothorax. No pulmonary nodule or mass noted. Pulmonary vasculature and the cardiomediastinal silhouette are within normal limits. Orthopedic fixation hardware in the lower cervical spine incidentally noted. IMPRESSION: No radiographic evidence of acute cardiopulmonary disease. Electronically Signed   By: Trudie Reed M.D.   On: 04/05/2016 17:14   Ct Head Wo  Contrast  Result Date: 04/05/2016 CLINICAL DATA:  Elevated blood sugars. EXAM: CT HEAD WITHOUT CONTRAST TECHNIQUE: Contiguous axial images were obtained from the base of the skull through the vertex without intravenous contrast. COMPARISON:  None. FINDINGS: Brain: No acute intracranial hemorrhage. No focal mass lesion. No CT evidence of acute infarction. No midline shift or mass effect. No hydrocephalus. Basilar cisterns are patent. Periventricular white matter hypodensities. 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 Vascular: No hyperdense vessel or unexpected calcification. Skull: Normal. Negative for fracture or focal lesion. Sinuses/Orbits: No acute finding. Other: None. IMPRESSION: 1. No acute intracranial findings. 2. 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 Electronically Signed   By: Genevive Bi M.D.   On: 04/05/2016 11:52   Mr Shirlee Latch ZO Contrast  Result Date: 04/05/2016 CLINICAL DATA:  60 y/o F; history of stroke with residual left-sided facial weakness, arm weakness, and leg weakness presenting with worsening confusion. EXAM: MRI HEAD WITHOUT CONTRAST MRA HEAD WITHOUT CONTRAST TECHNIQUE: Multiplanar, multiecho pulse sequences of the brain and surrounding structures were obtained without intravenous contrast. Angiographic images of the head were obtained using MRA technique without contrast. COMPARISON:  04/05/2016 CT head.  11/08/2014 MRI of the brain. FINDINGS: MRI HEAD FINDINGS Brain: There are foci of diffusion restriction within the right hemipons and within the bilateral middle cerebral peduncles. Areas of diffusion restriction are associated mild T2 FLAIR hyperintensity. There is a background of nonspecific foci of T2 FLAIR hyperintensity in subcortical and periventricular white matter with frontal parietal predominance compatible with moderate chronic microvascular ischemic changes. There is a chronic lacunar infarct within the left lentiform nucleus extending  into the corona radiata. No abnormal susceptibility hypointensity to indicate intracranial hemorrhage. Ventricle size is normal. No extra-axial collection. Vascular: See below. Skull and upper cervical spine: Normal marrow signal. Sinuses/Orbits: There is mucosal thickening within the anterior ethmoid air cells and the right frontal sinus. There is opacification of the right mastoid tip. Orbits are unremarkable. Other: None. MRA HEAD FINDINGS Anterior circulation: Irregularity of the cavernous and paraclinoid segments of internal carotid arteries bilaterally with mild stenosis. Bilateral MCA and ACA are patent without high-grade stenosis, aneurysm, or occlusion. Posterior circulation: Unchanged diminutive right vertebral artery and left dominant vertebrobasilar system. Irregularity of the basilar artery with moderate to severe short segment of distal stenosis that is worsened in comparison with the prior MRI. Bilateral posterior cerebral arteries are patent without high-grade stenosis, aneurysm, or occlusion. Anatomic variant: Left fetal posterior cerebral artery with diminutive P1 segment. No right posterior communicating artery identified, likely hypoplastic or absent. Probable diminutive anterior communicating artery IMPRESSION: 1. Foci of diffusion restriction within the right hemipons and abnormal signal in the middle cerebellar peduncles probably represent areas of acute/early subacute ischemia. No hemorrhage identified. 2. Increasing stenosis of the distal  basilar artery, now moderate-to-severe. 3. Otherwise the circle of Willis is patent with atherosclerotic changes and short segments of mild stenosis in the anterior and posterior circulation without large vessel occlusion or aneurysm. 4. Paranasal sinus disease predominantly in anterior ethmoid and right frontal sinuses. 5. Stable moderate chronic microvascular ischemic changes of the brain parenchyma. These results were called by telephone at the time of  interpretation on 04/05/2016 at 6:25 pm to Dr. Erma Heritage, who verbally acknowledged these results. Electronically Signed   By: Mitzi Hansen M.D.   On: 04/05/2016 18:28   Mr Brain Wo Contrast  Result Date: 04/05/2016 CLINICAL DATA:  60 y/o F; history of stroke with residual left-sided facial weakness, arm weakness, and leg weakness presenting with worsening confusion. EXAM: MRI HEAD WITHOUT CONTRAST MRA HEAD WITHOUT CONTRAST TECHNIQUE: Multiplanar, multiecho pulse sequences of the brain and surrounding structures were obtained without intravenous contrast. Angiographic images of the head were obtained using MRA technique without contrast. COMPARISON:  04/05/2016 CT head.  11/08/2014 MRI of the brain. FINDINGS: MRI HEAD FINDINGS Brain: There are foci of diffusion restriction within the right hemipons and within the bilateral middle cerebral peduncles. Areas of diffusion restriction are associated mild T2 FLAIR hyperintensity. There is a background of nonspecific foci of T2 FLAIR hyperintensity in subcortical and periventricular white matter with frontal parietal predominance compatible with moderate chronic microvascular ischemic changes. There is a chronic lacunar infarct within the left lentiform nucleus extending into the corona radiata. No abnormal susceptibility hypointensity to indicate intracranial hemorrhage. Ventricle size is normal. No extra-axial collection. Vascular: See below. Skull and upper cervical spine: Normal marrow signal. Sinuses/Orbits: There is mucosal thickening within the anterior ethmoid air cells and the right frontal sinus. There is opacification of the right mastoid tip. Orbits are unremarkable. Other: None. MRA HEAD FINDINGS Anterior circulation: Irregularity of the cavernous and paraclinoid segments of internal carotid arteries bilaterally with mild stenosis. Bilateral MCA and ACA are patent without high-grade stenosis, aneurysm, or occlusion. Posterior circulation:  Unchanged diminutive right vertebral artery and left dominant vertebrobasilar system. Irregularity of the basilar artery with moderate to severe short segment of distal stenosis that is worsened in comparison with the prior MRI. Bilateral posterior cerebral arteries are patent without high-grade stenosis, aneurysm, or occlusion. Anatomic variant: Left fetal posterior cerebral artery with diminutive P1 segment. No right posterior communicating artery identified, likely hypoplastic or absent. Probable diminutive anterior communicating artery IMPRESSION: 1. Foci of diffusion restriction within the right hemipons and abnormal signal in the middle cerebellar peduncles probably represent areas of acute/early subacute ischemia. No hemorrhage identified. 2. 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 3. Otherwise the circle of Willis is patent with atherosclerotic changes and short segments of mild stenosis in the anterior and posterior circulation without large vessel occlusion or aneurysm. 4. Paranasal sinus disease predominantly in anterior ethmoid and right frontal sinuses. 5. Stable moderate chronic microvascular ischemic changes of the brain parenchyma. These results were called by telephone at the time of interpretation on 04/05/2016 at 6:25 pm to Dr. Erma Heritage, who verbally acknowledged these results. Electronically Signed   By: Mitzi Hansen M.D.   On: 04/05/2016 18:28      Subjective: Severe right leg spasms overnight. Patient to swallow pills as she has sour taste in mouth.  Discharge Exam: Vitals:   04/08/16 0540 04/08/16 1000  BP: (!) 147/98 (!) 183/99  Pulse:  (!) 116  Resp:  18  Temp:  97.9 F (36.6 C)   Vitals:  04/08/16 0024 04/08/16 0451 04/08/16 0540 04/08/16 1000  BP: (!) 149/89 (!) 179/140 (!) 147/98 (!) 183/99  Pulse: (!) 108 (!) 102  (!) 116  Resp: 18 18  18   Temp: 97.4 F (36.3 C) 97.3 F (36.3 C)  97.9 F (36.6 C)  TempSrc: Oral Oral   Oral  SpO2: 98% 98%  98%  Weight:      Height:        General: Awake and alert, right facial droop, poorly communicative HEENT: No pallor, moist, supple neck Cardiovascular: RRR, S1/S2 +, no rubs, no gallops Chest:: CTA bilaterally, no wheezing, no rhonchi Abdominal: Soft, NT, ND, bowel sounds + Musculoskeletal no edema, no cyanosis CNS: Alert and awake, Left facial droop, mild dysarthria, 0/5 power in left upper extremity, 4/5 power in left lower extremity.    The results of significant diagnostics from this hospitalization (including imaging, microbiology, ancillary and laboratory) are listed below for reference.     Microbiology: No results found for this or any previous visit (from the past 240 hour(s)).   Labs: BNP (last 3 results) No results for input(s): BNP in the last 8760 hours. Basic Metabolic Panel:  Recent Labs Lab 04/05/16 1129 04/05/16 1620 04/08/16 1039  NA 140 142 139  K 3.9 3.9 3.5  CL 101 106 104  CO2  --  25 24  GLUCOSE 151* 137* 160*  BUN 11 8 14   CREATININE 0.40* 0.53 0.73  CALCIUM  --  10.2 9.8  MG  --   --  1.7   Liver Function Tests:  Recent Labs Lab 04/05/16 1620  AST 15  ALT 15  ALKPHOS 56  BILITOT 0.8  PROT 6.9  ALBUMIN 4.1   No results for input(s): LIPASE, AMYLASE in the last 168 hours.  Recent Labs Lab 04/05/16 1620  AMMONIA 14   CBC:  Recent Labs Lab 04/05/16 1104 04/05/16 1129  WBC 6.0  --   NEUTROABS 3.9  --   HGB 13.8 13.6  HCT 39.4 40.0  MCV 87.0  --   PLT 192  --    Cardiac Enzymes: No results for input(s): CKTOTAL, CKMB, CKMBINDEX, TROPONINI in the last 168 hours. BNP: Invalid input(s): POCBNP CBG:  Recent Labs Lab 04/07/16 2040 04/08/16 0019 04/08/16 0445 04/08/16 0809 04/08/16 1121  GLUCAP 165* 112* 112* 132* 136*   D-Dimer No results for input(s): DDIMER in the last 72 hours. Hgb A1c  Recent Labs  04/05/16 1620  HGBA1C 6.7*   Lipid Profile  Recent Labs  04/07/16 0317  CHOL  164  HDL 29*  LDLCALC 110*  TRIG 125  CHOLHDL 5.7   Thyroid function studies  Recent Labs  04/05/16 1620  TSH 1.073   Anemia work up  Recent Labs  04/07/16 0317  VITAMINB12 320  FOLATE 21.7   Urinalysis    Component Value Date/Time   COLORURINE YELLOW 04/05/2016 1218   APPEARANCEUR CLEAR 04/05/2016 1218   LABSPEC 1.013 04/05/2016 1218   PHURINE 7.0 04/05/2016 1218   GLUCOSEU NEGATIVE 04/05/2016 1218   HGBUR NEGATIVE 04/05/2016 1218   BILIRUBINUR NEGATIVE 04/05/2016 1218   KETONESUR 15 (A) 04/05/2016 1218   PROTEINUR NEGATIVE 04/05/2016 1218   UROBILINOGEN 0.2 11/08/2014 0046   NITRITE NEGATIVE 04/05/2016 1218   LEUKOCYTESUR TRACE (A) 04/05/2016 1218   Sepsis Labs Invalid input(s): PROCALCITONIN,  WBC,  LACTICIDVEN Microbiology No results found for this or any previous visit (from the past 240 hour(s)).   Time coordinating discharge: Over 30 minutes  SIGNED:  Eddie NorthHUNGEL, Amdrew Oboyle, MD  [facility] 04/08/2016, 2:35 PM Pager   If 7PM-7AM, please contact night-coverage [email or web address] Password [email or web address]

## 2016-04-08 NOTE — H&P (Signed)
Physical Medicine and Rehabilitation Admission H&P    Chief Complaint  Patient presents with  . Altered Mental Status  : HPI: Shelly Mcdaniel is a 60 y.o. 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 Per chart review patient lives with spouse. Used a quad cane prior to admission. She had recently been receiving outpatient therapies. Presented 04/05/2016 with altered mental status and dysarthria. Patient had a fall in March with resultant persistent left rib cage pain and medications adjusted for pain started on baclofen in September, Lyrica in August had been on Neurontin without any significant dosage changes. Urine drug screen negative. In the ED patient tachycardic 110-1:30 systolic pressure in the 170s. CT scan negative for acute changes. MRI showed foci of diffusion restriction within the right hemi-pons and abnormal signal in the middle cerebellar peduncle most likely representing acute early subacute ischemia. No hemorrhage. MRA with no large vessel occlusion or aneurysm. Patient did not receive TPA. EEG with generalized slowing pattern nonspecific consistent with global encephalopathic process. No seizure activity noted. Neurology consulted presently on aspirin and Plavix for CVA prophylaxis. Tolerating a regular consistency diet. Formal physical  therapy evaluations completed. Patient was admitted for a comprehensive rehabilitation program.  ROS Constitutional: Negative for chills and fever.  HENT: Negative for hearing loss.   Eyes: Negative for blurred vision and double vision.  Respiratory: Negative for cough and shortness of breath.   Cardiovascular: Negative for chest pain, palpitations and leg swelling.  Gastrointestinal: Positive for constipation. Negative for nausea and vomiting.  Genitourinary:  Negative for dysuria and hematuria.  Musculoskeletal: Positive for back pain, falls, joint pain and myalgias.  Skin: Negative for rash.  Neurological: Positive for speech change and weakness. Negative for seizures, loss of consciousness and headaches.  Psychiatric/Behavioral: Positive for depression.  All other systems reviewed and are negative   Past Medical History:  Diagnosis Date  . Diabetes mellitus without complication (HCC)   . Hyperlipidemia   . Hypertension    Past Surgical History:  Procedure Laterality Date  . BACK SURGERY    . BREAST SURGERY    . COLONOSCOPY WITH PROPOFOL N/A 12/24/2013   Procedure: COLONOSCOPY WITH PROPOFOL;  Surgeon: Charolett Bumpers, MD;  Location: [facility];  Service: Endoscopy;  Laterality: N/A;   Family History  Problem Relation Age of Onset  . Stroke Mother   . Dementia Mother   . Diabetes Father   . Stroke Father   . CAD Father   . Diabetes Sister   . Diabetes Brother   . CAD Brother   . Stroke Brother   . Kidney failure Brother   . Hypertension Other   . Hyperlipidemia Other   . Stroke Other   . Heart disease Other   . Diabetes Other    Social History:  reports that she has never smoked. She has never used smokeless tobacco. She reports that she does not drink alcohol or use drugs. Allergies:  Allergies  Allergen Reactions  . Bee Venom Anaphylaxis  . Codeine Itching  . Lipitor [Atorvastatin] Other (See Comments)    Cramping   . Septra [Sulfamethoxazole-Trimethoprim]     Mother died from Stevens-Johnson syndrome and sister had anaphylaxis from Septra.Physician told her never to take this medication.   Medications Prior to Admission  Medication Sig Dispense Refill  . aspirin EC 81 MG  tablet Take 81 mg by mouth daily.    . baclofen (LIORESAL) 10 MG tablet Take 10 mg by mouth 2 (two) times daily.     Marland Kitchen. docusate sodium (COLACE) 100 MG capsule Take 100 mg by mouth daily.    . insulin aspart (NOVOLOG FLEXPEN) 100 UNIT/ML FlexPen  Inject 5 Units into the skin 3 (three) times daily with meals. (Patient taking differently: Inject 4 Units into the skin 2 (two) times daily before lunch and supper. ) 15 mL 11  . LEVEMIR FLEXTOUCH 100 UNIT/ML Pen Inject 25 Units into the skin every evening.     . lidocaine (LIDODERM) 5 % Place 1 patch onto the skin daily. Remove & Discard patch within 12 hours or as directed by MD    . lisinopril (PRINIVIL,ZESTRIL) 40 MG tablet Take 40 mg by mouth daily.    . Melatonin 3 MG TABS Take 3 mg by mouth at bedtime.    . metFORMIN (GLUCOPHAGE-XR) 500 MG 24 hr tablet Take 1,000 mg by mouth 2 (two) times daily.     . ondansetron (ZOFRAN) 4 MG tablet Take 1 tablet (4 mg total) by mouth every 8 (eight) hours as needed for nausea or vomiting. 20 tablet 0  . polyethylene glycol (MIRALAX / GLYCOLAX) packet Take 17 g by mouth daily as needed for moderate constipation.    . pregabalin (LYRICA) 75 MG capsule Take 75 mg by mouth 2 (two) times daily.    Marland Kitchen. scopolamine (TRANSDERM-SCOP) 1 MG/3DAYS Place 1 patch onto the skin every 3 (three) days.    Marland Kitchen. senna (SENOKOT) 8.6 MG tablet Take 1 tablet by mouth daily.    . sertraline (ZOLOFT) 50 MG tablet Take 50 mg by mouth at bedtime.     . simvastatin (ZOCOR) 40 MG tablet Take 40 mg by mouth at bedtime.    . traMADol (ULTRAM) 50 MG tablet Take 1 tablet (50 mg total) by mouth every 6 (six) hours as needed. 5 tablet 0  . cephALEXin (KEFLEX) 500 MG capsule Take 1 capsule (500 mg total) by mouth 2 (two) times daily. (Patient not taking: Reported on 04/05/2016) 40 capsule 0  . gabapentin (NEURONTIN) 100 MG capsule Titrate dose as directed (Patient not taking: Reported on 04/05/2016) 90 capsule 1  . gabapentin (NEURONTIN) 300 MG capsule Titrate dose as directed (Patient not taking: Reported on 04/05/2016) 20 capsule 0  . quinapril-hydrochlorothiazide (ACCURETIC) 20-25 MG per tablet Take 1 tablet by mouth at bedtime.      Home: Home Living Family/patient expects to be  discharged to:: Private residence Living Arrangements: Spouse/significant other Available Help at Discharge: Family, Available 24 hours/day Type of Home: House Home Access: Ramped entrance Home Layout: One level, Able to live on main level with bedroom/bathroom Bathroom Shower/Tub: Tub/shower unit, Engineer, building servicesCurtain Bathroom Toilet: Standard Home Equipment: [employer]Cane - quad, Bedside commode, Tub bench, Grab bars - tub/shower, Wheelchair - manual  Lives With: Spouse   Functional History: Prior Function Level of Independence: Needs assistance Gait / Transfers Assistance Needed: face to face assisted transfers  Functional Status:  Mobility: Bed Mobility Overal bed mobility: Needs Assistance Bed Mobility: Rolling, Sidelying to Sit Rolling: Max assist Sidelying to sit: Max assist General bed mobility comments: cues for direction, assist throughout transition.  assist to scoot  Transfers Overall transfer level: Needs assistance Equipment used: Ambulation equipment used Transfers: Sit to/from Stand, Stand Pivot Transfers Sit to Stand: Mod assist Stand pivot transfers: Max assist General transfer comment: needed forward and lifting assist, w/shift and  pivoting assist.  truncal stability Ambulation/Gait General Gait Details: not tested today, transfer only    ADL:    Cognition: Cognition Overall Cognitive Status: Impaired/Different from baseline Orientation Level: Oriented to person, Oriented to place, Disoriented to time, Disoriented to situation Cognition Arousal/Alertness: Awake/alert Behavior During Therapy: WFL for tasks assessed/performed Overall Cognitive Status: Impaired/Different from baseline  Physical Exam: Blood pressure (!) 183/99, pulse (!) 116, temperature 97.9 F (36.6 C), temperature source Oral, resp. rate 18, height 5\' 8"  (1.727 m), weight 73.3 kg (161 lb 9.6 oz), SpO2 98 %. Physical Exam  Constitutional: She is oriented to person, place, and time. She appears  well-developed and well-nourished.  HENT:  Head: Normocephalic and atraumatic.  Right Ear: External ear normal.  Left Ear: External ear normal.  Mouth/Throat: Oropharynx is clear and moist.  Eyes: Conjunctivae and EOM are normal. Pupils are equal, round, and reactive to light. No scleral icterus.  Neck: No JVD present. No tracheal deviation present. No thyromegaly present.  Cardiovascular: Normal rate, regular rhythm and intact distal pulses.  Exam reveals no friction rub.   No murmur heard. Respiratory: Effort normal. No respiratory distress. She has no wheezes. She has no rales. She exhibits no tenderness.  GI: She exhibits no distension. There is no tenderness. There is no rebound.  Musculoskeletal: She exhibits no edema. Tenderness: left shoulder tender with PROM. 1/2" sublux.  Neurological: She is alert and oriented to person, place, and time. She displays abnormal reflex (LUE and LLE 3+). A cranial nerve deficit (left central 7 and tongue deviation.) is present. She exhibits abnormal muscle tone (MAS Left shoulder adducotrs 3/4. biceps 2/4, wrist/finger flexors 2/4. 1/4 knee flexors/plantrar flexors. 4-5 beats of clonus at left ankle). Coordination normal.  RUE and RLE 4 to 4+/5. LUE underlying 1/4 deltoid, biceps, triceps, wrist and finger flexors.  LLE 1+ HF, KE and 0/5 ADF/PF.   Skin: No rash noted. No erythema. No pallor.  Psychiatric: She has a normal mood and affect.  Delayed processing. Decreased insight and awareness but overall cooperative     Results for orders placed or performed during the hospital encounter of 04/05/16 (from the past 48 hour(s))  Glucose, capillary     Status: Abnormal   Collection Time: 04/06/16 11:52 AM  Result Value Ref Range   Glucose-Capillary 165 (H) 65 - 99 mg/dL  Glucose, capillary     Status: Abnormal   Collection Time: 04/06/16  4:23 PM  Result Value Ref Range   Glucose-Capillary 102 (H) 65 - 99 mg/dL  Glucose, capillary     Status: Abnormal    Collection Time: 04/06/16  7:36 PM  Result Value Ref Range   Glucose-Capillary 165 (H) 65 - 99 mg/dL   Comment 1 Notify RN    Comment 2 Document in Chart   Glucose, capillary     Status: Abnormal   Collection Time: 04/06/16 11:37 PM  Result Value Ref Range   Glucose-Capillary 111 (H) 65 - 99 mg/dL   Comment 1 Notify RN    Comment 2 Document in Chart   Lipid panel     Status: Abnormal   Collection Time: 04/07/16  3:17 AM  Result Value Ref Range   Cholesterol 164 0 - 200 mg/dL   Triglycerides 161 <096 mg/dL   HDL 29 (L) >04 mg/dL   Total CHOL/HDL Ratio 5.7 RATIO   VLDL 25 0 - 40 mg/dL   LDL Cholesterol 540 (H) 0 - 99 mg/dL    Comment:  Total Cholesterol/HDL:CHD Risk Coronary Heart Disease Risk Table                     Men   Women  1/2 Average Risk   3.4   3.3  Average Risk       5.0   4.4  2 X Average Risk   9.6   7.1  3 X Average Risk  23.4   11.0        Use the calculated Patient Ratio above and the CHD Risk Table to determine the patient's CHD Risk.        ATP III CLASSIFICATION (LDL):  <100     mg/dL   Optimal  161-096  mg/dL   Near or Above                    Optimal  130-159  mg/dL   Borderline  045-409  mg/dL   High  >811     mg/dL   Very High   Vitamin B14     Status: None   Collection Time: 04/07/16  3:17 AM  Result Value Ref Range   Vitamin B-12 320 180 - 914 pg/mL    Comment: (NOTE) This assay is not validated for testing neonatal or myeloproliferative syndrome specimens for Vitamin B12 levels.   Folate     Status: None   Collection Time: 04/07/16  3:17 AM  Result Value Ref Range   Folate 21.7 >5.9 ng/mL  Glucose, capillary     Status: Abnormal   Collection Time: 04/07/16  3:40 AM  Result Value Ref Range   Glucose-Capillary 125 (H) 65 - 99 mg/dL   Comment 1 Notify RN    Comment 2 Document in Chart   Glucose, capillary     Status: Abnormal   Collection Time: 04/07/16  7:58 AM  Result Value Ref Range   Glucose-Capillary 130 (H) 65 - 99  mg/dL  Glucose, capillary     Status: Abnormal   Collection Time: 04/07/16 11:28 AM  Result Value Ref Range   Glucose-Capillary 168 (H) 65 - 99 mg/dL  Glucose, capillary     Status: Abnormal   Collection Time: 04/07/16  4:47 PM  Result Value Ref Range   Glucose-Capillary 109 (H) 65 - 99 mg/dL   Comment 1 Notify RN    Comment 2 Document in Chart   Glucose, capillary     Status: Abnormal   Collection Time: 04/07/16  8:40 PM  Result Value Ref Range   Glucose-Capillary 165 (H) 65 - 99 mg/dL   Comment 1 Notify RN    Comment 2 Document in Chart   Glucose, capillary     Status: Abnormal   Collection Time: 04/08/16 12:19 AM  Result Value Ref Range   Glucose-Capillary 112 (H) 65 - 99 mg/dL   Comment 1 Notify RN    Comment 2 Document in Chart   Glucose, capillary     Status: Abnormal   Collection Time: 04/08/16  4:45 AM  Result Value Ref Range   Glucose-Capillary 112 (H) 65 - 99 mg/dL   Comment 1 Notify RN    Comment 2 Document in Chart   Glucose, capillary     Status: Abnormal   Collection Time: 04/08/16  8:09 AM  Result Value Ref Range   Glucose-Capillary 132 (H) 65 - 99 mg/dL   No results found.     Medical Problem List and Plan: 1.  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  DVT Prophylaxis/Anticoagulation: Lovenox. Monitor for any bleeding episode 3. Pain Management with recent fall and persistent rib cage pain: Lidoderm patch, Baclofen10 mg 3 times a day, Robaxin as needed. 4. Mood: Zoloft 50 mg daily, 5. Neuropsych: This patient is capable of making decisions on her own behalf. 6. Skin/Wound Care: Routine skin checks 7. 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 Hemoglobin A1c 6.7.Presently on Levemir 25 unit  QHS. Check blood sugars before meals and at bedtime.Patient also Glucophage 1000 mg BID PTA and resume as needed. 9.Hypertension with bouts of  orthostasis. Presently on Norvasc 5 mg daily. Patient also on lisinopril 40 mg daily as well as Accuretic prior to admission. Resume as needed 10 Hyperlipidemia .Zocor 40 mg daily 12. Spastic left hemiparesis, acute on chronic  -baclofen just resumed on acute   -titrate to tolerance and effect while on rehab  -consider botox injections  -continue splinting/ROM    Post Admission Physician Evaluation: 1. Functional deficits secondary  to right pontine infarct. 2. Patient is admitted to receive collaborative, interdisciplinary care between the physiatrist, rehab nursing staff, and therapy team. 3. Patient's level of medical complexity and substantial therapy needs in context of that medical necessity cannot be provided at a lesser intensity of care such as a SNF. 4. Patient has experienced substantial functional loss from his/her baseline which was documented above under the "Functional History" and "Functional Status" headings.  Judging by the patient's diagnosis, physical exam, and functional history, the patient has potential for functional progress which will result in measurable gains while on inpatient rehab.  These gains will be of substantial and practical use upon discharge  in facilitating mobility and self-care at the household level. 5. Physiatrist will provide 24 hour management of medical needs as well as oversight of the therapy plan/treatment and provide guidance as appropriate regarding the interaction of the two. 6. 24 hour rehab nursing will assist with bladder management, bowel management, safety, skin/wound care, disease management, medication administration, pain management and patient education  and help integrate therapy concepts, techniques,education, etc. 7. PT will assess and treat for/with: Lower extremity strength, range of motion,  stamina, balance, functional mobility, safety, adaptive techniques and equipment, NMR, spasticity mgt, w/c assessment/use, orthotics, pain control, ego support, family ed.   Goals are: min to mod assist. 8. OT will assess and treat for/with: ADL's, functional mobility, safety, upper extremity strength, adaptive techniques and equipment, NMR, spasticity mgt, orthotics, shoulder ROM/support, family education.   Goals are: min to mod assist. Therapy may proceed with showering this patient. 9. SLP will assess and treat for/with: cognition, swallowing, communication, education.  Goals are: supervision to min assist. 10. Case Management and Social Worker will assess and treat for psychological issues and discharge planning. 11. Team conference will be held weekly to assess progress toward goals and to determine barriers to discharge. 12. Patient will receive at least 3 hours of therapy per day at least 5 days per week. 13. ELOS: 20-25 days       14. Prognosis:  good     Ranelle Oyster, MD, [facility] Physical Medicine & Rehabilitation 04/08/2016  04/08/2016

## 2016-04-08 NOTE — Evaluation (Signed)
Occupational Therapy Evaluation Patient Details Name: Shelly Mcdaniel MRN: [id number] DOB: July 05, 1955 Today's Date: 04/08/2016    History of Present Illness pt is a 60 yo female with h/o CVA August 2017 with LUE weakness, dysarthria and pain now admitted with AMS, waxing/waning confusion, strong urine odor.  Pt with h/o Prior C4-C7 ACDF.   Pt CXR negative.  MRI showed diffuse restriction right hemipons, middle cerebellar peduncles ischemia   Clinical Impression   PT admitted with acute CVA R hemipons and middle cereebellar peduncles. Pt currently with functional limitiations due to the deficits listed below (see OT problem list). Pt currently at outpatient therapy for previous CVA.  Pt will benefit from skilled OT to increase their independence and safety with adls and balance to allow discharge CIR .     Follow Up Recommendations  CIR    Equipment Recommendations  Other (comment) (defer to CIR)    Recommendations for Other Services Rehab consult     Precautions / Restrictions Precautions Precautions: Fall      Mobility Bed Mobility               General bed mobility comments: in chair on arrival  Transfers Overall transfer level: Needs assistance Equipment used: 2 person hand held assist Transfers: Sit to/from Stand Sit to Stand: +2 physical assistance;Mod assist         General transfer comment: Pt needed cues for upright posture. Pt requires (A) for L LE advancement. pt needed (A) to faciliate weight shift    Balance Overall balance assessment: Needs assistance Sitting-balance support: Single extremity supported;Feet supported Sitting balance-Leahy Scale: Poor Sitting balance - Comments: R lateral lean     Standing balance-Leahy Scale: Poor                              ADL Overall ADL's : Needs assistance/impaired Eating/Feeding: Maximal assistance Eating/Feeding Details (indicate cue type and reason): pt holding ice cream in mouth  and then spitting out in dislike. pt noted to still pocket even after spitting it out Grooming: Wash/dry hands;Wash/dry face;Brushing hair;Maximal assistance Grooming Details (indicate cue type and reason): pt provided hair combing and tearful and states "ouch" with hair being pulled back into a clip.  Upper Body Bathing: Maximal assistance   Lower Body Bathing: Maximal assistance   Upper Body Dressing : Maximal assistance   Lower Body Dressing: Maximal assistance   Toilet Transfer: +2 for physical assistance;Maximal assistance           Functional mobility during ADLs: +2 for physical assistance;Maximal assistance General ADL Comments: Pt requires (A) for all weight shifting and total (A) to advance L LE     Vision Vision Assessment?: Vision impaired- to be further tested in functional context Additional Comments: pt able to scanning R and L and locate therapist in room. pt with decr attention that makes visual assessment difficutl. OT to further assess with functional task   Perception     Praxis      Pertinent Vitals/Pain Pain Assessment: No/denies pain     Hand Dominance Right   Extremity/Trunk Assessment Upper Extremity Assessment Upper Extremity Assessment: LUE deficits/detail LUE Deficits / Details: digits held in flexed position at rest, pt noted to have tone throughout UE. pt with forearm tendons felt with light palpation. pt with tears with slightly digit extension but when distracted able to fully range digits but wrist is slight flexed. pt with good shoulder placement  no subluxation noted. Pt without scapula activation noted during session btu patient guarding L Ue due to pain LUE Sensation: decreased light touch LUE Coordination: decreased fine motor;decreased gross motor   Lower Extremity Assessment Lower Extremity Assessment: Defer to PT evaluation   Cervical / Trunk Assessment Cervical / Trunk Assessment: Kyphotic   Communication  Communication Communication: Expressive difficulties   Cognition Arousal/Alertness: Awake/alert Behavior During Therapy: WFL for tasks assessed/performed Overall Cognitive Status: Impaired/Different from baseline Area of Impairment: Attention;Memory;Following commands;Safety/judgement;Awareness;Problem solving   Current Attention Level: Focused Memory: Decreased recall of precautions Following Commands: Follows one step commands with increased time Safety/Judgement: Decreased awareness of safety;Decreased awareness of deficits Awareness: Intellectual Problem Solving: Slow processing;Decreased initiation;Difficulty sequencing General Comments: Pt with difficulty sequencing swallowing medication. pt crying at times in frustration. pt needed cues multiple times to initiate task. pt requires tactile input to help facilitate muscle activation with transfers   General Comments       Exercises       Shoulder Instructions      Home Living Family/patient expects to be discharged to:: Private residence Living Arrangements: Spouse/significant other Available Help at Discharge: Family;Available 24 hours/day Type of Home: House Home Access: Ramped entrance     Home Layout: One level;Able to live on main level with bedroom/bathroom     Bathroom Shower/Tub: Tub/shower unit;Curtain Shower/tub characteristics: Engineer, building servicesCurtain Bathroom Toilet: Standard     Home Equipment: Cane - quad;Bedside commode;Tub bench;Grab bars - tub/shower;Wheelchair - manual   Additional Comments: patient has custom made splint for L UE that has been off for x1 week. Pt has toe lift for L shoe  Lives With: Spouse    Prior Functioning/Environment Level of Independence: Needs assistance  Gait / Transfers Assistance Needed: face to face assisted transfers              OT Problem List: Decreased strength;Decreased activity tolerance;Impaired balance (sitting and/or standing);Decreased safety awareness;Decreased  cognition;Decreased coordination;Impaired vision/perception;Decreased range of motion;Decreased knowledge of use of DME or AE;Decreased knowledge of precautions;Impaired UE functional use   OT Treatment/Interventions: Self-care/ADL training;Therapeutic exercise;Neuromuscular education;DME and/or AE instruction;Therapeutic activities;Cognitive remediation/compensation;Visual/perceptual remediation/compensation;Patient/family education;Balance training    OT Goals(Current goals can be found in the care plan section) Acute Rehab OT Goals Patient Stated Goal: none stated by patient. spouse speaking and reporting walking  OT Goal Formulation: With patient/family Time For Goal Achievement: 04/22/16 Potential to Achieve Goals: Good  OT Frequency: Min 2X/week   Barriers to D/C:            Co-evaluation              End of Session Equipment Utilized During Treatment: Gait belt Nurse Communication: Mobility status;Precautions  Activity Tolerance: Patient tolerated treatment well Patient left: in chair;with call bell/phone within reach;with family/visitor present;with chair alarm set   Time: 1005-1036 OT Time Calculation (min): 31 min Charges:  OT General Charges $OT Visit: 1 Procedure OT Evaluation $OT Eval Moderate Complexity: 1 Procedure G-Codes:    Boone MasterJones, Nareg Breighner B 04/08/2016, 12:53 PM  Mateo FlowJones, Brynn   OTR/L Pager: [phone]: [phone] Office: [phone] .

## 2016-04-08 NOTE — Progress Notes (Signed)
Report given to 4 west.

## 2016-04-08 NOTE — Interval H&P Note (Signed)
Shelly Mcdaniel was admitted today to Inpatient Rehabilitation with the diagnosis of right pontine infarct.  The patient's history has been reviewed, patient examined, and there is no change in status.  Patient continues to be appropriate for intensive inpatient rehabilitation.  I have reviewed the patient's chart and labs.  Questions were answered to the patient's satisfaction. The PAPE has been reviewed and assessment remains appropriate.  Drishti Pepperman T 04/08/2016, 7:57 PM

## 2016-04-08 NOTE — Care Management Note (Addendum)
Case Management Note  Patient Details  Name: Shelly Mcdaniel MRN: [id number] Date of Birth: 09-24-1955  Subjective/Objective:  Pt admitted with Stroke - also has a history of CVA                  Action/Plan:  PTA from home with husband.  CIR recommended - agency has deemed pt appropriate - insurance auth being sought today.  CM ordered CSW consult/ left voicemail and texted CSW requesting assessment for possible SNF placement as back up plan for CIR.  Both pt and husband are in agreement with SNF as the back up plan for discharge.  CM will continue to follow for discharge needs   Expected Discharge Date:                  Expected Discharge Plan:  IP Rehab Facility  In-House Referral:  Clinical Social Work  Discharge planning Services  CM Consult  Post Acute Care Choice:    Choice offered to:     DME Arranged:    DME Agency:     HH Arranged:    HH Agency:     Status of Service:  In process, will continue to follow  If discussed at Long Length of Stay Meetings, dates discussed:    Additional Comments: CM spoke with CIR liason and the plan is for agency to admit pt today Shelly Mcdaniel, Shelly Wurster S, RN 04/08/2016, 10:22 AM

## 2016-04-08 NOTE — Progress Notes (Signed)
STROKE TEAM PROGRESS NOTE   SUBJECTIVE (INTERVAL HISTORY) Husband is at bedside. Pt was found to have orthostatic hypotension yesterday. Put on TED hose and elevated head of bed. Repeat orthostatic vital today continues to show orthostatic hypotension but improved some. However, pt does not like TED hose.    OBJECTIVE Temp:  [97.3 F (36.3 C)-98.2 F (36.8 C)] 97.3 F (36.3 C) (10/27 0451) Pulse Rate:  [102-116] 102 (10/27 0451) Cardiac Rhythm: Sinus tachycardia (10/27 0705) Resp:  [16-20] 18 (10/27 0451) BP: (147-179)/(89-140) 147/98 (10/27 0540) SpO2:  [97 %-100 %] 98 % (10/27 0451)  CBC:   Recent Labs Lab 04/05/16 1104 04/05/16 1129  WBC 6.0  --   NEUTROABS 3.9  --   HGB 13.8 13.6  HCT 39.4 40.0  MCV 87.0  --   PLT 192  --     Basic Metabolic Panel:   Recent Labs Lab 04/05/16 1129 04/05/16 1620  NA 140 142  K 3.9 3.9  CL 101 106  CO2  --  25  GLUCOSE 151* 137*  BUN 11 8  CREATININE 0.40* 0.53  CALCIUM  --  10.2    Lipid Panel:     Component Value Date/Time   CHOL 164 04/07/2016 0317   TRIG 125 04/07/2016 0317   HDL 29 (L) 04/07/2016 0317   CHOLHDL 5.7 04/07/2016 0317   VLDL 25 04/07/2016 0317   LDLCALC 110 (H) 04/07/2016 0317   HgbA1c:  Lab Results  Component Value Date   HGBA1C 6.7 (H) 04/05/2016   Urine Drug Screen:     Component Value Date/Time   LABOPIA NONE DETECTED 04/05/2016 1215   COCAINSCRNUR NONE DETECTED 04/05/2016 1215   LABBENZ NONE DETECTED 04/05/2016 1215   AMPHETMU NONE DETECTED 04/05/2016 1215   THCU NONE DETECTED 04/05/2016 1215   LABBARB NONE DETECTED 04/05/2016 1215      IMAGING I have personally reviewed the radiological images below and agree with the radiology interpretations.  Dg Chest 2 View 04/05/2016 No radiographic evidence of acute cardiopulmonary disease.   Ct Head Wo Contrast 04/05/2016 1. No acute intracranial findings.  2. 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    Mr Brain 52  Contrast Mr Maxine Glenn Head Wo Contrast 04/05/2016 1. Foci of diffusion restriction within the right hemipons and abnormal signal in the middle cerebellar peduncles probably represent areas of acute/early subacute ischemia. No hemorrhage identified.  2. 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  3. Otherwise the circle of Willis is patent with atherosclerotic changes and short segments of mild stenosis in the anterior and posterior circulation without large vessel occlusion or aneurysm.  4. Paranasal sinus disease predominantly in anterior ethmoid and right frontal sinuses.  5. Stable moderate chronic microvascular ischemic changes of the brain parenchyma.    EEG This is an abnormal EEG due to the presence of mild diffuse generalized slowing with greater intermixed bifrontal slowing. This pattern is nonspecific and consistent with a global encephalopathic process, nonspecific as to etiology. There is no evidence of seizure on the study.  CUS 01/17/2016 - bilateral plaques, but no hemodynamically significant stenosis  TTE - 01/17/16 - mild diastolic dysfunction with borderline left ventricular hypertrophy with a trace amount of mital regurgitation, trace amount of aortic regurgitation, trace amount of tricuspid regurgitation.  MRI brain 01/17/16 - atrophy and chronic micro-vascular ischemic changes. There is restricted diffusion on the right side of the pons consistent with an acute infarct.     PHYSICAL EXAM  Temp:  [97.3  F (36.3 C)-98.2 F (36.8 C)] 97.3 F (36.3 C) (10/27 0451) Pulse Rate:  [102-116] 102 (10/27 0451) Resp:  [16-20] 18 (10/27 0451) BP: (147-179)/(89-140) 147/98 (10/27 0540) SpO2:  [97 %-100 %] 98 % (10/27 0451)  General - Well nourished, well developed, in no apparent distress.  Ophthalmologic - Fundi not visualized due to eye movement.  Cardiovascular - Regular rate and rhythm.  Mental Status -  Level of arousal and orientation to month,  and person were intact, not orientated to year and place. Language exam showed mild wording finding difficulty and slow speech, mild dysarthria, but intact naming, repetition, comprehension.  Cranial Nerves II - XII - II - Visual field intact OU. III, IV, VI - Extraocular movements intact, with slight disconjugation of left eye on left eye gaze. V - Facial sensation intact bilaterally. VII - left facial droop. VIII - Hearing & vestibular intact bilaterally. X - Palate elevates symmetrically, mild dysarthria. XI - Chin turning & shoulder shrug intact bilaterally. XII - Tongue protrusion intact.  Motor Strength - The patient's strength was 5/5 RUE and RLE, but 0/5 LUE and 4-/5 LLE proximal but 0/5 DF and PF. Bulk was normal and fasciculations were absent.   Motor Tone - Muscle tone was assessed at the neck and appendages and was increased LUE.  Reflexes - The patient's reflexes were 1+ in all extremities and she had no pathological reflexes.  Sensory - Light touch, temperature/pinprick were assessed and were symmetrical.    Coordination - The patient had normal movements in the right hand with no ataxia or dysmetria.  Tremor was absent.  Gait and Station -  Able to stand with assistance and not able to walk without assistance.   ASSESSMENT/PLAN [name] is a 60 y.o. female with history of stroke in Aug 2017 w/ L HP, HTN, HLD and DM presenting with increased confusion and lethargy x 3 days. She did not receive IV t-PA.   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 Need maximize the medical treatment first.  Resultant continued left sided weakness  MRI  R pontine and b/l cerebellar peduncle infarcts  MRA  Increasing mod to severe BA stenosis comparing with MRA 10/2014, right VA chronic occlusion  Carotid Doppler 01/17/2016 unremarkable  2D Echo 01/17/2016 unremarkable  EEG generalized slowing, no seizure activity  LDL 110  HgbA1c  6.7  Lovenox 40 mg sq daily for VTE prophylaxis Diet Carb Modified Fluid consistency: Thin; Room service appropriate? Yes  aspirin 81 mg daily prior to admission, now on aspirin 325 mg daily and clopidogrel 75 mg daily. Continue DAPT for 3 months and then plavix alone. Need to maximize the medical treatment first. If still not sufficient, may consider stent in the future.  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   Treated in [facility] hospital  MRI report in 01/17/2016 - right pontine infarct, pending MRI imaging to be reviewed  TTE and CUS both unremarkable  Discharged with ASA and zocor  Pt not able to tolerate high dose lipitor  Orthostatic hypotension with supine hypertension  BP 180/106 on arrival in setting of neurologic symptoms Supine hypertension positive orthostatic vitals Not tolerating TED hose Bed of head 30 degree hydration BP goal 130-150 due to BA stenosis  Hyperlipidemia  Home  meds:  zocor 40, resumed in hospital  LDL 110, goal < 70  Continue statin at discharge  Pt not able to tolerate high dose lipitor  Diabetes type II  HgbA1c 6.7, goal < 7.0  Controlled  Had hx of poorly controlled DM  On levemier  SSI  Other Stroke Risk Factors  Family hx stroke (mother, father, brother, other)  Other Active Problems  Sinus tachycardia  Chronic pain  BPPV treated in 10/2014  Hospital day # 2  Neurology will sign off. Please call with questions. [facility]. Thanks for the consult.  Marvel PlanJindong Keon Pender, MD PhD Stroke Neurology 04/09/2016 6:54 AM   To contact Stroke Continuity provider, please refer to [email or web address]. After hours, contact General Neurology

## 2016-04-08 NOTE — Progress Notes (Addendum)
[facility]

## 2016-04-08 NOTE — H&P (View-Only) (Signed)
Physical Medicine and Rehabilitation Admission H&P    Chief Complaint  Patient presents with  . Altered Mental Status  : HPI: Shelly Mcdaniel is a 60 y.o. 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 Per chart review patient lives with spouse. Used a quad cane prior to admission. She had recently been receiving outpatient therapies. Presented 04/05/2016 with altered mental status and dysarthria. Patient had a fall in March with resultant persistent left rib cage pain and medications adjusted for pain started on baclofen in September, Lyrica in August had been on Neurontin without any significant dosage changes. Urine drug screen negative. In the ED patient tachycardic 110-1:30 systolic pressure in the 170s. CT scan negative for acute changes. MRI showed foci of diffusion restriction within the right hemi-pons and abnormal signal in the middle cerebellar peduncle most likely representing acute early subacute ischemia. No hemorrhage. MRA with no large vessel occlusion or aneurysm. Patient did not receive TPA. EEG with generalized slowing pattern nonspecific consistent with global encephalopathic process. No seizure activity noted. Neurology consulted presently on aspirin and Plavix for CVA prophylaxis. Tolerating a regular consistency diet. Formal physical  therapy evaluations completed. Patient was admitted for a comprehensive rehabilitation program.  ROS Constitutional: Negative for chills and fever.  HENT: Negative for hearing loss.   Eyes: Negative for blurred vision and double vision.  Respiratory: Negative for cough and shortness of breath.   Cardiovascular: Negative for chest pain, palpitations and leg swelling.  Gastrointestinal: Positive for constipation. Negative for nausea and vomiting.  Genitourinary:  Negative for dysuria and hematuria.  Musculoskeletal: Positive for back pain, falls, joint pain and myalgias.  Skin: Negative for rash.  Neurological: Positive for speech change and weakness. Negative for seizures, loss of consciousness and headaches.  Psychiatric/Behavioral: Positive for depression.  All other systems reviewed and are negative   Past Medical History:  Diagnosis Date  . Diabetes mellitus without complication (HCC)   . Hyperlipidemia   . Hypertension    Past Surgical History:  Procedure Laterality Date  . BACK SURGERY    . BREAST SURGERY    . COLONOSCOPY WITH PROPOFOL N/A 12/24/2013   Procedure: COLONOSCOPY WITH PROPOFOL;  Surgeon: Charolett Bumpers, MD;  Location: [facility];  Service: Endoscopy;  Laterality: N/A;   Family History  Problem Relation Age of Onset  . Stroke Mother   . Dementia Mother   . Diabetes Father   . Stroke Father   . CAD Father   . Diabetes Sister   . Diabetes Brother   . CAD Brother   . Stroke Brother   . Kidney failure Brother   . Hypertension Other   . Hyperlipidemia Other   . Stroke Other   . Heart disease Other   . Diabetes Other    Social History:  reports that she has never smoked. She has never used smokeless tobacco. She reports that she does not drink alcohol or use drugs. Allergies:  Allergies  Allergen Reactions  . Bee Venom Anaphylaxis  . Codeine Itching  . Lipitor [Atorvastatin] Other (See Comments)    Cramping   . Septra [Sulfamethoxazole-Trimethoprim]     Mother died from Stevens-Johnson syndrome and sister had anaphylaxis from Septra.Physician told her never to take this medication.   Medications Prior to Admission  Medication Sig Dispense Refill  . aspirin EC 81 MG  tablet Take 81 mg by mouth daily.    . baclofen (LIORESAL) 10 MG tablet Take 10 mg by mouth 2 (two) times daily.     Marland Kitchen. docusate sodium (COLACE) 100 MG capsule Take 100 mg by mouth daily.    . insulin aspart (NOVOLOG FLEXPEN) 100 UNIT/ML FlexPen  Inject 5 Units into the skin 3 (three) times daily with meals. (Patient taking differently: Inject 4 Units into the skin 2 (two) times daily before lunch and supper. ) 15 mL 11  . LEVEMIR FLEXTOUCH 100 UNIT/ML Pen Inject 25 Units into the skin every evening.     . lidocaine (LIDODERM) 5 % Place 1 patch onto the skin daily. Remove & Discard patch within 12 hours or as directed by MD    . lisinopril (PRINIVIL,ZESTRIL) 40 MG tablet Take 40 mg by mouth daily.    . Melatonin 3 MG TABS Take 3 mg by mouth at bedtime.    . metFORMIN (GLUCOPHAGE-XR) 500 MG 24 hr tablet Take 1,000 mg by mouth 2 (two) times daily.     . ondansetron (ZOFRAN) 4 MG tablet Take 1 tablet (4 mg total) by mouth every 8 (eight) hours as needed for nausea or vomiting. 20 tablet 0  . polyethylene glycol (MIRALAX / GLYCOLAX) packet Take 17 g by mouth daily as needed for moderate constipation.    . pregabalin (LYRICA) 75 MG capsule Take 75 mg by mouth 2 (two) times daily.    Marland Kitchen. scopolamine (TRANSDERM-SCOP) 1 MG/3DAYS Place 1 patch onto the skin every 3 (three) days.    Marland Kitchen. senna (SENOKOT) 8.6 MG tablet Take 1 tablet by mouth daily.    . sertraline (ZOLOFT) 50 MG tablet Take 50 mg by mouth at bedtime.     . simvastatin (ZOCOR) 40 MG tablet Take 40 mg by mouth at bedtime.    . traMADol (ULTRAM) 50 MG tablet Take 1 tablet (50 mg total) by mouth every 6 (six) hours as needed. 5 tablet 0  . cephALEXin (KEFLEX) 500 MG capsule Take 1 capsule (500 mg total) by mouth 2 (two) times daily. (Patient not taking: Reported on 04/05/2016) 40 capsule 0  . gabapentin (NEURONTIN) 100 MG capsule Titrate dose as directed (Patient not taking: Reported on 04/05/2016) 90 capsule 1  . gabapentin (NEURONTIN) 300 MG capsule Titrate dose as directed (Patient not taking: Reported on 04/05/2016) 20 capsule 0  . quinapril-hydrochlorothiazide (ACCURETIC) 20-25 MG per tablet Take 1 tablet by mouth at bedtime.      Home: Home Living Family/patient expects to be  discharged to:: Private residence Living Arrangements: Spouse/significant other Available Help at Discharge: Family, Available 24 hours/day Type of Home: House Home Access: Ramped entrance Home Layout: One level, Able to live on main level with bedroom/bathroom Bathroom Shower/Tub: Tub/shower unit, Engineer, building servicesCurtain Bathroom Toilet: Standard Home Equipment: [employer] Service[employer] CompanyCane - quad, Bedside commode, Tub bench, Grab bars - tub/shower, Wheelchair - manual  Lives With: Spouse   Functional History: Prior Function Level of Independence: Needs assistance Gait / Transfers Assistance Needed: face to face assisted transfers  Functional Status:  Mobility: Bed Mobility Overal bed mobility: Needs Assistance Bed Mobility: Rolling, Sidelying to Sit Rolling: Max assist Sidelying to sit: Max assist General bed mobility comments: cues for direction, assist throughout transition.  assist to scoot  Transfers Overall transfer level: Needs assistance Equipment used: Ambulation equipment used Transfers: Sit to/from Stand, Stand Pivot Transfers Sit to Stand: Mod assist Stand pivot transfers: Max assist General transfer comment: needed forward and lifting assist, w/shift and  pivoting assist.  truncal stability Ambulation/Gait General Gait Details: not tested today, transfer only    ADL:    Cognition: Cognition Overall Cognitive Status: Impaired/Different from baseline Orientation Level: Oriented to person, Oriented to place, Disoriented to time, Disoriented to situation Cognition Arousal/Alertness: Awake/alert Behavior During Therapy: WFL for tasks assessed/performed Overall Cognitive Status: Impaired/Different from baseline  Physical Exam: Blood pressure (!) 183/99, pulse (!) 116, temperature 97.9 F (36.6 C), temperature source Oral, resp. rate 18, height 5\' 8"  (1.727 m), weight 73.3 kg (161 lb 9.6 oz), SpO2 98 %. Physical Exam  Constitutional: She is oriented to person, place, and time. She appears  well-developed and well-nourished.  HENT:  Head: Normocephalic and atraumatic.  Right Ear: External ear normal.  Left Ear: External ear normal.  Mouth/Throat: Oropharynx is clear and moist.  Eyes: Conjunctivae and EOM are normal. Pupils are equal, round, and reactive to light. No scleral icterus.  Neck: No JVD present. No tracheal deviation present. No thyromegaly present.  Cardiovascular: Normal rate, regular rhythm and intact distal pulses.  Exam reveals no friction rub.   No murmur heard. Respiratory: Effort normal. No respiratory distress. She has no wheezes. She has no rales. She exhibits no tenderness.  GI: She exhibits no distension. There is no tenderness. There is no rebound.  Musculoskeletal: She exhibits no edema. Tenderness: left shoulder tender with PROM. 1/2" sublux.  Neurological: She is alert and oriented to person, place, and time. She displays abnormal reflex (LUE and LLE 3+). A cranial nerve deficit (left central 7 and tongue deviation.) is present. She exhibits abnormal muscle tone (MAS Left shoulder adducotrs 3/4. biceps 2/4, wrist/finger flexors 2/4. 1/4 knee flexors/plantrar flexors. 4-5 beats of clonus at left ankle). Coordination normal.  RUE and RLE 4 to 4+/5. LUE underlying 1/4 deltoid, biceps, triceps, wrist and finger flexors.  LLE 1+ HF, KE and 0/5 ADF/PF.   Skin: No rash noted. No erythema. No pallor.  Psychiatric: She has a normal mood and affect.  Delayed processing. Decreased insight and awareness but overall cooperative     Results for orders placed or performed during the hospital encounter of 04/05/16 (from the past 48 hour(s))  Glucose, capillary     Status: Abnormal   Collection Time: 04/06/16 11:52 AM  Result Value Ref Range   Glucose-Capillary 165 (H) 65 - 99 mg/dL  Glucose, capillary     Status: Abnormal   Collection Time: 04/06/16  4:23 PM  Result Value Ref Range   Glucose-Capillary 102 (H) 65 - 99 mg/dL  Glucose, capillary     Status: Abnormal    Collection Time: 04/06/16  7:36 PM  Result Value Ref Range   Glucose-Capillary 165 (H) 65 - 99 mg/dL   Comment 1 Notify RN    Comment 2 Document in Chart   Glucose, capillary     Status: Abnormal   Collection Time: 04/06/16 11:37 PM  Result Value Ref Range   Glucose-Capillary 111 (H) 65 - 99 mg/dL   Comment 1 Notify RN    Comment 2 Document in Chart   Lipid panel     Status: Abnormal   Collection Time: 04/07/16  3:17 AM  Result Value Ref Range   Cholesterol 164 0 - 200 mg/dL   Triglycerides 161 <096 mg/dL   HDL 29 (L) >04 mg/dL   Total CHOL/HDL Ratio 5.7 RATIO   VLDL 25 0 - 40 mg/dL   LDL Cholesterol 540 (H) 0 - 99 mg/dL    Comment:  Total Cholesterol/HDL:CHD Risk Coronary Heart Disease Risk Table                     Men   Women  1/2 Average Risk   3.4   3.3  Average Risk       5.0   4.4  2 X Average Risk   9.6   7.1  3 X Average Risk  23.4   11.0        Use the calculated Patient Ratio above and the CHD Risk Table to determine the patient's CHD Risk.        ATP III CLASSIFICATION (LDL):  <100     mg/dL   Optimal  161-096  mg/dL   Near or Above                    Optimal  130-159  mg/dL   Borderline  045-409  mg/dL   High  >811     mg/dL   Very High   Vitamin B14     Status: None   Collection Time: 04/07/16  3:17 AM  Result Value Ref Range   Vitamin B-12 320 180 - 914 pg/mL    Comment: (NOTE) This assay is not validated for testing neonatal or myeloproliferative syndrome specimens for Vitamin B12 levels.   Folate     Status: None   Collection Time: 04/07/16  3:17 AM  Result Value Ref Range   Folate 21.7 >5.9 ng/mL  Glucose, capillary     Status: Abnormal   Collection Time: 04/07/16  3:40 AM  Result Value Ref Range   Glucose-Capillary 125 (H) 65 - 99 mg/dL   Comment 1 Notify RN    Comment 2 Document in Chart   Glucose, capillary     Status: Abnormal   Collection Time: 04/07/16  7:58 AM  Result Value Ref Range   Glucose-Capillary 130 (H) 65 - 99  mg/dL  Glucose, capillary     Status: Abnormal   Collection Time: 04/07/16 11:28 AM  Result Value Ref Range   Glucose-Capillary 168 (H) 65 - 99 mg/dL  Glucose, capillary     Status: Abnormal   Collection Time: 04/07/16  4:47 PM  Result Value Ref Range   Glucose-Capillary 109 (H) 65 - 99 mg/dL   Comment 1 Notify RN    Comment 2 Document in Chart   Glucose, capillary     Status: Abnormal   Collection Time: 04/07/16  8:40 PM  Result Value Ref Range   Glucose-Capillary 165 (H) 65 - 99 mg/dL   Comment 1 Notify RN    Comment 2 Document in Chart   Glucose, capillary     Status: Abnormal   Collection Time: 04/08/16 12:19 AM  Result Value Ref Range   Glucose-Capillary 112 (H) 65 - 99 mg/dL   Comment 1 Notify RN    Comment 2 Document in Chart   Glucose, capillary     Status: Abnormal   Collection Time: 04/08/16  4:45 AM  Result Value Ref Range   Glucose-Capillary 112 (H) 65 - 99 mg/dL   Comment 1 Notify RN    Comment 2 Document in Chart   Glucose, capillary     Status: Abnormal   Collection Time: 04/08/16  8:09 AM  Result Value Ref Range   Glucose-Capillary 132 (H) 65 - 99 mg/dL   No results found.     Medical Problem List and Plan: 1.  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  DVT Prophylaxis/Anticoagulation: Lovenox. Monitor for any bleeding episode 3. Pain Management with recent fall and persistent rib cage pain: Lidoderm patch, Baclofen10 mg 3 times a day, Robaxin as needed. 4. Mood: Zoloft 50 mg daily, 5. Neuropsych: This patient is capable of making decisions on her own behalf. 6. Skin/Wound Care: Routine skin checks 7. 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 Hemoglobin A1c 6.7.Presently on Levemir 25 unit  QHS. Check blood sugars before meals and at bedtime.Patient also Glucophage 1000 mg BID PTA and resume as needed. 9.Hypertension with bouts of  orthostasis. Presently on Norvasc 5 mg daily. Patient also on lisinopril 40 mg daily as well as Accuretic prior to admission. Resume as needed 10 Hyperlipidemia .Zocor 40 mg daily 12. Spastic left hemiparesis, acute on chronic  -baclofen just resumed on acute   -titrate to tolerance and effect while on rehab  -consider botox injections  -continue splinting/ROM    Post Admission Physician Evaluation: 1. Functional deficits secondary  to right pontine infarct. 2. Patient is admitted to receive collaborative, interdisciplinary care between the physiatrist, rehab nursing staff, and therapy team. 3. Patient's level of medical complexity and substantial therapy needs in context of that medical necessity cannot be provided at a lesser intensity of care such as a SNF. 4. Patient has experienced substantial functional loss from his/her baseline which was documented above under the "Functional History" and "Functional Status" headings.  Judging by the patient's diagnosis, physical exam, and functional history, the patient has potential for functional progress which will result in measurable gains while on inpatient rehab.  These gains will be of substantial and practical use upon discharge  in facilitating mobility and self-care at the household level. 5. Physiatrist will provide 24 hour management of medical needs as well as oversight of the therapy plan/treatment and provide guidance as appropriate regarding the interaction of the two. 6. 24 hour rehab nursing will assist with bladder management, bowel management, safety, skin/wound care, disease management, medication administration, pain management and patient education  and help integrate therapy concepts, techniques,education, etc. 7. PT will assess and treat for/with: Lower extremity strength, range of motion,  stamina, balance, functional mobility, safety, adaptive techniques and equipment, NMR, spasticity mgt, w/c assessment/use, orthotics, pain control, ego support, family ed.   Goals are: min to mod assist. 8. OT will assess and treat for/with: ADL's, functional mobility, safety, upper extremity strength, adaptive techniques and equipment, NMR, spasticity mgt, orthotics, shoulder ROM/support, family education.   Goals are: min to mod assist. Therapy may proceed with showering this patient. 9. SLP will assess and treat for/with: cognition, swallowing, communication, education.  Goals are: supervision to min assist. 10. Case Management and Social Worker will assess and treat for psychological issues and discharge planning. 11. Team conference will be held weekly to assess progress toward goals and to determine barriers to discharge. 12. Patient will receive at least 3 hours of therapy per day at least 5 days per week. 13. ELOS: 20-25 days       14. Prognosis:  good     Ranelle Oyster, MD, [facility] Physical Medicine & Rehabilitation 04/08/2016  04/08/2016

## 2016-04-08 NOTE — Progress Notes (Signed)
Rehab admissions - I met briefly with husband and patient at the bedside.  They would like inpatient rehab admission.  I will call and open case with state [employer] now.  I hope to hear back from them later today.  I will then follow up with all.  Call me for questions.  #[phone]

## 2016-04-08 NOTE — PMR Pre-admission (Signed)
PMR Admission Coordinator Pre-Admission Assessment  Patient: Shelly Mcdaniel is an 60 y.o., female MRN: [id number] DOB: 1955-10-28 Height: _0  (172.7 cm) Weight: 73.3 kg (161 lb 9.6 oz)             Insurance Information HMO:     PPO: Yes     PCP:       IPA:       80/20:       OTHER:  Group # [id number] PRIMARY: BCBS State      Policy#: [id number]      Subscriber: Tami Lin CM Name: Philis Fendt    Phone#:  [phone]     Fax#: [phone] Pre-Cert#: [id number] from 04/08/16 to 04/21/16 with update due 04/20/16     Employer: Retired Benefits:  Phone #: [phone]     Name: Clement Sayres. Date: 06/14/15     Deduct:  $1080 (met $ 1080)      Out of Pocket Max: [phone] (met [phone])      Life Max: Unlimited CIR: $337 copay per admission and then 70%      SNF: 70% with 100 visit limit Outpatient: 70%     Co-Pay: 30% Home Health: 70%      Co-Pay: 30% DME: 70%     Co-Pay: 30% Providers: in network  Medicaid Application Date:        Case Manager:   Disability Application Date:        Case Worker:    Emergency [place]    Name Relation Home Work Mobile   [place] Spouse [phone]  [phone]   Kairee, Kozma Daughter [phone]     Sherlyne, [place]   [phone]     Current Medical History  Patient Admitting Diagnosis: R pontine infarct  History of Present Illness:  A 60 y.o. right handed female with history of hypertension, diabetes mellitus, history of falls, CVA August 2017 while vacationing at [facility] and received therapies at [facility] with residual left-sided weakness maintained on aspirin. Per chart review patient lives with spouse. Used a quad cane prior to admission. She had recently been receiving outpatient therapies. Presented 04/05/2016 with altered mental status and dysarthria. Patient had a fall in March with resultant persistent left rib cage pain and medications adjusted for pain started on baclofen in September, Lyrica in  August had been on Neurontin without any significant dosage changes. Urine drug screen negative. In the ED patient tachycardic 774-1:28 systolic pressure in the 786V. CT scan negative for acute changes. MRI showed foci of diffusion restriction within the right hemi-pons and abnormal signal in the middle cerebellar peduncle most likely representing acute early subacute ischemia. No hemorrhage. MRA with no large vessel occlusion or aneurysm. Patient did not receive TPA. EEG with generalized slowing pattern nonspecific consistent with global encephalopathic process. No seizure activity noted. Neurology consulted presently on aspirin and Plavix for CVA prophylaxis. Tolerating a regular consistency diet. Formal physical and occupational therapy evaluations pending. M.D. has requested physical medicine rehabilitation consult.   Total: 8=NIH  Past Medical History  Past Medical History:  Diagnosis Date  . Diabetes mellitus without complication ([place])   . Hyperlipidemia   . Hypertension     Family History  family history includes CAD in her brother and father; Dementia in her mother; Diabetes in her brother, father, other, and sister; Heart disease in her other; Hyperlipidemia in her other; Hypertension in her other; Kidney failure in her brother; Stroke in her brother, father, mother, and other.  Prior Rehab/Hospitalizations: Had inpatient rehab at [facility] for a month and then home 09/17.  Had [facility] neuro rehab 2-3 X a week after discharge home.  Had CVA at [facility] 01/15/16.   Has the patient had major surgery during 100 days prior to admission? No  Current Medications   Current Facility-Administered Medications:  .  0.9 %  sodium chloride infusion, , Intravenous, Continuous, Alison Murray, MD, Last Rate: 50 mL/hr at 04/08/16 0056 .  acetaminophen (TYLENOL) tablet 650 mg, 650 mg, Oral, Q6H PRN **OR** acetaminophen (TYLENOL) suppository 650 mg, 650 mg, Rectal, Q6H PRN **OR** acetaminophen  (TYLENOL) solution 650 mg, 650 mg, Oral, Q6H PRN, Nishant Dhungel, MD .  amLODipine (NORVASC) tablet 5 mg, 5 mg, Oral, Daily, Alison Murray, MD, 5 mg at 04/07/16 1213 .  aspirin EC tablet 325 mg, 325 mg, Oral, Daily, 325 mg at 04/07/16 0940 **OR** aspirin suppository 300 mg, 300 mg, Rectal, Daily, Marvel Plan, MD, 300 mg at 04/08/16 1420 .  baclofen (LIORESAL) tablet 10 mg, 10 mg, Oral, TID, Alison Murray, MD, 10 mg at 04/07/16 2102 .  clopidogrel (PLAVIX) tablet 75 mg, 75 mg, Oral, Daily, Marvel Plan, MD, 75 mg at 04/07/16 0939 .  enoxaparin (LOVENOX) injection 40 mg, 40 mg, Subcutaneous, Q24H, Kimberly B Hammons, RPH, 40 mg at 04/08/16 1230 .  famotidine (PEPCID) tablet 20 mg, 20 mg, Oral, BID, Gerhard Munch Hammons, RPH, 20 mg at 04/07/16 2102 .  insulin aspart (novoLOG) injection 0-15 Units, 0-15 Units, Subcutaneous, Q4H, Russella Dar, NP, 2 Units at 04/08/16 1230 .  insulin detemir (LEVEMIR) injection 25 Units, 25 Units, Subcutaneous, QHS, Russella Dar, NP, 25 Units at 04/07/16 2101 .  labetalol (NORMODYNE,TRANDATE) injection 10 mg, 10 mg, Intravenous, Q2H PRN, Ozella Rocks, MD, 10 mg at 04/08/16 0459 .  lidocaine (LIDODERM) 5 % 1 patch, 1 patch, Transdermal, Q24H, Russella Dar, NP, 1 patch at 04/07/16 2300 .  LORazepam (ATIVAN) injection 0.5-1 mg, 0.5-1 mg, Intravenous, Q4H PRN, Russella Dar, NP .  magnesium sulfate IVPB 1 g 100 mL, 1 g, Intravenous, Once, Nishant Dhungel, MD .  methocarbamol (ROBAXIN) 500 mg in dextrose 5 % 50 mL IVPB, 500 mg, Intravenous, Q6H PRN, Russella Dar, NP, 500 mg at 04/08/16 0301 .  morphine 4 MG/ML injection 1-4 mg, 1-4 mg, Intravenous, Q2H PRN, Russella Dar, NP .  polyethylene glycol (MIRALAX / GLYCOLAX) packet 17 g, 17 g, Oral, Daily, Alison Murray, MD, 17 g at 04/07/16 2259 .  promethazine (PHENERGAN) injection 12.5 mg, 12.5 mg, Intravenous, Q4H PRN, Courteney Lyn Mackuen, MD, 12.5 mg at 04/05/16 1436 .  scopolamine (TRANSDERM-SCOP) 1 MG/3DAYS  1.5 mg, 1 patch, Transdermal, Q72H, Russella Dar, NP, 1.5 mg at 04/05/16 2323 .  simvastatin (ZOCOR) tablet 40 mg, 40 mg, Oral, QHS, Marvel Plan, MD, 40 mg at 04/07/16 2102  Patients Current Diet: Diet Carb Modified Fluid consistency: Thin; Room service appropriate? Yes  Precautions / Restrictions Precautions Precautions: Fall Restrictions Weight Bearing Restrictions: No   Has the patient had 2 or more falls or a fall with injury in the past year?Yes.  Had fall X 1 a year ago with intercostal injury with treatment.  Prior Activity Level Community (5-7x/wk): Went out daily.  Was going to outpatient rehab at neuro rehab 2-3 X a week.  Was a Comptroller at [employer].  Home Assistive Devices / Equipment Home Equipment: Cane - quad, Bedside commode, Tub bench, Grab bars -  tub/shower, Wheelchair - manual  Prior Device Use: Indicate devices/aids used by the patient prior to current illness, exacerbation or injury? Manual wheelchair, Information systems manager  Prior Functional Level Prior Function Level of Independence: Needs assistance Gait / Transfers Assistance Needed: face to face assisted transfers  Self Care: Did the patient need help bathing, dressing, using the toilet or eating?  Independent  Indoor Mobility: Did the patient need assistance with walking from room to room (with or without device)? Independent  Stairs: Did the patient need assistance with internal or external stairs (with or without device)? Needed some help  Functional Cognition: Did the patient need help planning regular tasks such as shopping or remembering to take medications? Independent  Current Functional Level Cognition  Overall Cognitive Status: Impaired/Different from baseline Current Attention Level: Focused Orientation Level: Oriented to person, Oriented to place, Disoriented to time, Disoriented to situation Following Commands: Follows one step commands with increased time Safety/Judgement: Decreased  awareness of safety, Decreased awareness of deficits General Comments: Pt with difficulty sequencing swallowing medication. pt crying at times in frustration. pt needed cues multiple times to initiate task. pt requires tactile input to help facilitate muscle activation with transfers    Extremity Assessment (includes Sensation/Coordination)  Upper Extremity Assessment: LUE deficits/detail LUE Deficits / Details: digits held in flexed position at rest, pt noted to have tone throughout UE. pt with forearm tendons felt with light palpation. pt with tears with slightly digit extension but when distracted able to fully range digits but wrist is slight flexed. pt with good shoulder placement no subluxation noted. Pt without scapula activation noted during session btu patient guarding L Ue due to pain LUE Sensation: decreased light touch LUE Coordination: decreased fine motor, decreased gross motor  Lower Extremity Assessment: Defer to PT evaluation RLE Deficits / Details: WFL LLE Deficits / Details: Trace synergistic movement quads/hams, 2/5 hip flexors.  But shows more control of R knee in stance, with more guarding than support LLE Coordination: decreased fine motor, decreased gross motor    ADLs  Overall ADL's : Needs assistance/impaired Eating/Feeding: Maximal assistance Eating/Feeding Details (indicate cue type and reason): pt holding ice cream in mouth and then spitting out in dislike. pt noted to still pocket even after spitting it out Grooming: Wash/dry hands, Wash/dry face, Brushing hair, Maximal assistance Grooming Details (indicate cue type and reason): pt provided hair combing and tearful and states "ouch" with hair being pulled back into a clip.  Upper Body Bathing: Maximal assistance Lower Body Bathing: Maximal assistance Upper Body Dressing : Maximal assistance Lower Body Dressing: Maximal assistance Toilet Transfer: +2 for physical assistance, Maximal assistance Functional mobility  during ADLs: +2 for physical assistance, Maximal assistance General ADL Comments: Pt requires (A) for all weight shifting and total (A) to advance L LE    Mobility  Overal bed mobility: Needs Assistance Bed Mobility: Rolling, Sidelying to Sit Rolling: Max assist Sidelying to sit: Max assist General bed mobility comments: in chair on arrival    Transfers  Overall transfer level: Needs assistance Equipment used: 2 person hand held assist Transfers: Sit to/from Stand Sit to Stand: +2 physical assistance, Mod assist Stand pivot transfers: Max assist General transfer comment: Pt needed cues for upright posture. Pt requires (A) for L LE advancement. pt needed (A) to faciliate weight shift    Ambulation / Gait / Stairs / Wheelchair Mobility  Ambulation/Gait Ambulation/Gait assistance: Mod assist, +2 physical assistance, Max assist Ambulation Distance (Feet): 3 Feet Assistive device: 2 person  hand held assist Gait Pattern/deviations: Step-to pattern General Gait Details: paretic gait on the right.  pt needed assist with w/shift and advancing L LE    Posture / Balance Dynamic Sitting Balance Sitting balance - Comments: R lateral lean Balance Overall balance assessment: Needs assistance Sitting-balance support: Single extremity supported Sitting balance-Leahy Scale: Poor Sitting balance - Comments: R lateral lean Standing balance-Leahy Scale: Poor Standing balance comment: needs external support, lists posteriorly,  Worked in standing 4-5 mins on w/shift and stepping.    Special needs/care consideration BiPAP/CPAP No CPM No Continuous Drip IV 0.9% NS 50 mL/hr Dialysis No      Life Vest No Oxygen No Special Bed No Trach Size No Wound Vac (area) No      Skin Dry skin, reddened area on left shoulder, has left foot brace for foot drop/no dorsi flexion                             Bowel mgmt: Last BM 04/05/16 Bladder mgmt: Incontinence Diabetic mgmt Yes, on oral medications and  insulin at home    Previous Home Environment Living Arrangements: Spouse/significant other  Lives With: Spouse Available Help at Discharge: Family, Available 24 hours/day Type of Home: House Home Layout: One level, Able to live on main level with bedroom/bathroom Home Access: Ramped entrance Bathroom Shower/Tub: Tub/shower unit, Curtain Bathroom Toilet: Standard Additional Comments: patient has custom made splint for L UE that has been off for x1 week. Pt has toe lift for L shoe  Discharge Living Setting Plans for Discharge Living Setting: Patient's home, House, Lives with (comment) (Lives with husband.) Type of Home at Discharge: House Discharge Home Layout: Two level, Able to live on main level with bedroom/bathroom Alternate Level Stairs-Number of Steps: Flight Discharge Home Access: Stairs to enter, Ramped entrance (3 step entry and a ramp in place.) Entrance Stairs-Number of Steps: 3 steps entry and a ramp in place.  Social/Family/Support Systems Patient Roles: Spouse, Parent (Has a husband, son and daughter.) Son is a paramedic in [place] and Daughter is an anesthesia resident in [place]. Sport and exercise psychologist Information: Dhani Imel - husband Anticipated Caregiver: husband Anticipated Caregiver's Contact Information: Quillian Quince - husband - (c) [phone] Ability/Limitations of Caregiver: Husband can assist and has been assisting at home as needed. Caregiver Availability: 24/7 Discharge Plan Discussed with Primary Caregiver: Yes Is Caregiver In Agreement with Plan?: Yes Does Caregiver/Family have Issues with Lodging/Transportation while Pt is in Rehab?: No  Goals/Additional Needs Patient/Family Goal for Rehab: PT/OT/SLP min assist goals Expected length of stay: 18-22 days Cultural Considerations: Christian Dietary Needs: Carb mod, med cal, thin liquids Equipment Needs: TBD Pt/Family Agrees to Admission and willing to participate: Yes Program Orientation Provided & Reviewed with  Pt/Caregiver Including Roles  & Responsibilities: Yes  Decrease burden of Care through IP rehab admission: N/A  Possible need for SNF placement upon discharge: Not planned  Patient Condition: This patient's condition remains as documented in the consult dated 04/07/16, in which the Rehabilitation Physician determined and documented that the patient's condition is appropriate for intensive rehabilitative care in an inpatient rehabilitation facility. Will admit to inpatient rehab today.  Preadmission Screen Completed By:  Retta Diones, 04/08/2016 2:56 PM ______________________________________________________________________   Discussed status with Dr. Naaman Plummer on 04/08/16 at 1451 and received telephone approval for admission today.  Admission Coordinator:  Retta Diones, time1451/Date10/27/17

## 2016-04-08 NOTE — Progress Notes (Signed)
Physical Therapy Treatment Patient Details Name: Wendie SimmerJody D Rodenberg MRN: [id number] DOB: [birth year] Today's Date: 04/08/2016    History of Present Illness pt is a 60 yo female with h/o CVA August 2017 with LUE weakness, dysarthria and pain now admitted with AMS, waxing/waning confusion, strong urine odor.  Pt with h/o Prior C4-C7 ACDF.   Pt CXR negative.  MRI showed diffuse restriction right hemipons, middle cerebellar peduncles ischemia    PT Comments    Progressing as expect with time spent mostly on her feet..  Emphasis on sliding to edge of chair, sit to standing, pregait with w/shift assist and then advancing LE's.  Ending with attempts to ambulate with 2 person HHA.  Pt will be a good rehab candidat  Follow Up Recommendations  CIR;Supervision/Assistance - 24 hour     Equipment Recommendations  None recommended by PT    Recommendations for Other Services Rehab consult     Precautions / Restrictions Precautions Precautions: Fall    Mobility  Bed Mobility               General bed mobility comments: in chair on arrival  Transfers Overall transfer level: Needs assistance Equipment used: 2 person hand held assist Transfers: Sit to/from Stand Sit to Stand: +2 physical assistance;Mod assist         General transfer comment: Pt needed cues for upright posture. Pt requires (A) for L LE advancement. pt needed (A) to faciliate weight shift  Ambulation/Gait Ambulation/Gait assistance: Mod assist;+2 physical assistance;Max assist Ambulation Distance (Feet): 3 Feet Assistive device: 2 person hand held assist Gait Pattern/deviations: Step-to pattern     General Gait Details: paretic gait on the right.  pt needed assist with w/shift and advancing L LE   Stairs            Wheelchair Mobility    Modified Rankin (Stroke Patients Only) Modified Rankin (Stroke Patients Only) Pre-Morbid Rankin Score: Moderately severe disability Modified Rankin: Severe  disability     Balance Overall balance assessment: Needs assistance Sitting-balance support: Single extremity supported Sitting balance-Leahy Scale: Poor Sitting balance - Comments: R lateral lean     Standing balance-Leahy Scale: Poor                      Cognition Arousal/Alertness: Awake/alert Behavior During Therapy: WFL for tasks assessed/performed Overall Cognitive Status: Impaired/Different from baseline Area of Impairment: Attention;Memory;Following commands;Safety/judgement;Awareness;Problem solving   Current Attention Level: Focused Memory: Decreased recall of precautions Following Commands: Follows one step commands with increased time Safety/Judgement: Decreased awareness of safety;Decreased awareness of deficits Awareness: Intellectual Problem Solving: Slow processing;Decreased initiation;Difficulty sequencing General Comments: Pt with difficulty sequencing swallowing medication. pt crying at times in frustration. pt needed cues multiple times to initiate task. pt requires tactile input to help facilitate muscle activation with transfers    Exercises      General Comments        Pertinent Vitals/Pain Pain Assessment: Faces Faces Pain Scale: Hurts even more Pain Location: left hand arm Pain Descriptors / Indicators: Grimacing;Moaning Pain Intervention(s): Monitored during session;Repositioned    Home Living Family/patient expects to be discharged to:: Private residence Living Arrangements: Spouse/significant other Available Help at Discharge: Family;Available 24 hours/day Type of Home: House Home Access: Ramped entrance   Home Layout: One level;Able to live on main level with bedroom/bathroom Home Equipment: Cane - quad;Bedside commode;Tub bench;Grab bars - tub/shower;Wheelchair - manual Additional Comments: patient has custom made splint for L UE that has been off for  x1 week. Pt has toe lift for L shoe    Prior Function Level of Independence:  Needs assistance  Gait / Transfers Assistance Needed: face to face assisted transfers       PT Goals (current goals can now be found in the care plan section) Acute Rehab PT Goals Patient Stated Goal: none stated by patient. spouse speaking and reporting walking  PT Goal Formulation: With patient Time For Goal Achievement: 04/21/16 Potential to Achieve Goals: Good Progress towards PT goals: Progressing toward goals    Frequency    Min 4X/week      PT Plan Current plan remains appropriate    Co-evaluation PT/OT/SLP Co-Evaluation/Treatment: Yes Reason for Co-Treatment: Complexity of the patient's impairments (multi-system involvement) PT goals addressed during session: Mobility/safety with mobility       End of Session Equipment Utilized During Treatment: Gait belt Activity Tolerance: Patient tolerated treatment well Patient left: in chair;with call bell/phone within reach;with chair alarm set;with family/visitor present     Time: 1005-1036 PT Time Calculation (min) (ACUTE ONLY): 31 min  Charges:  $Therapeutic Activity: 8-22 mins                    G Codes:      Abbi Mancini, Eliseo Gum 04/08/2016, 2:06 PM 04/08/2016  [place], PT [phone]  (pager)

## 2016-04-09 ENCOUNTER — Inpatient Hospital Stay (HOSPITAL_COMMUNITY): Payer: BC Managed Care – PPO | Admitting: Physical Therapy

## 2016-04-09 ENCOUNTER — Inpatient Hospital Stay (HOSPITAL_COMMUNITY): Payer: BC Managed Care – PPO | Admitting: Speech Pathology

## 2016-04-09 ENCOUNTER — Inpatient Hospital Stay (HOSPITAL_COMMUNITY): Payer: BC Managed Care – PPO | Admitting: Occupational Therapy

## 2016-04-09 ENCOUNTER — Inpatient Hospital Stay (HOSPITAL_COMMUNITY): Payer: BC Managed Care – PPO

## 2016-04-09 DIAGNOSIS — E1165 Type 2 diabetes mellitus with hyperglycemia: Secondary | ICD-10-CM

## 2016-04-09 LAB — GLUCOSE, CAPILLARY
GLUCOSE-CAPILLARY: 189 mg/dL — AB (ref 65–99)
GLUCOSE-CAPILLARY: 141 mg/dL — AB (ref 65–99)
Glucose-Capillary: 172 mg/dL — ABNORMAL HIGH (ref 65–99)
Glucose-Capillary: 157 mg/dL — ABNORMAL HIGH (ref 65–99)

## 2016-04-09 MED ORDER — RAMELTEON 8 MG PO TABS
4.0000 mg | ORAL_TABLET | Freq: Every day | ORAL | Status: DC
  Filled 2016-04-09: qty 1

## 2016-04-09 MED ORDER — JEVITY 1.2 CAL PO LIQD
1000.0000 mL | ORAL | Status: DC
  Filled 2016-04-09 (×2): qty 1000

## 2016-04-09 MED ORDER — FREE WATER
240.0000 mL | Freq: Two times a day (BID) | Status: DC
  Administered 2016-04-09 – 2016-04-11 (×5): 240 mL

## 2016-04-09 MED ORDER — METHOCARBAMOL 1000 MG/10ML IJ SOLN
500.0000 mg | Freq: Four times a day (QID) | INTRAMUSCULAR | Status: DC | PRN
  Administered 2016-04-09: 500 mg via INTRAVENOUS
  Filled 2016-04-09 (×2): qty 5

## 2016-04-09 MED ORDER — PRO-STAT SUGAR FREE PO LIQD
30.0000 mL | Freq: Every day | ORAL | Status: DC
  Administered 2016-04-09 – 2016-04-25 (×16): 30 mL
  Filled 2016-04-09 (×17): qty 30

## 2016-04-09 MED ORDER — INSULIN DETEMIR 100 UNIT/ML ~~LOC~~ SOLN
15.0000 [IU] | Freq: Every day | SUBCUTANEOUS | Status: DC
Start: 2016-04-09 — End: 2016-04-10
  Administered 2016-04-09: 15 [IU] via SUBCUTANEOUS
  Filled 2016-04-09 (×2): qty 0.15

## 2016-04-09 MED ORDER — BISACODYL 10 MG RE SUPP
10.0000 mg | Freq: Every day | RECTAL | Status: DC | PRN
  Administered 2016-04-10 – 2016-04-18 (×3): 10 mg via RECTAL
  Filled 2016-04-09 (×3): qty 1

## 2016-04-09 MED ORDER — ASPIRIN 300 MG RE SUPP
300.0000 mg | Freq: Every day | RECTAL | Status: DC
  Filled 2016-04-09 (×5): qty 1

## 2016-04-09 MED ORDER — JEVITY 1.5 CAL/FIBER PO LIQD
1000.0000 mL | ORAL | Status: AC
  Administered 2016-04-09: 1000 mL
  Filled 2016-04-09: qty 1000

## 2016-04-09 MED ORDER — NON FORMULARY: 3.0000 mg | Freq: Every day | Status: DC

## 2016-04-09 MED ORDER — FAMOTIDINE 20 MG PO TABS
20.0000 mg | ORAL_TABLET | Freq: Two times a day (BID) | ORAL | Status: DC
  Administered 2016-04-09 – 2016-05-01 (×43): 20 mg via ORAL
  Filled 2016-04-09 (×44): qty 1

## 2016-04-09 MED ORDER — ASPIRIN 81 MG PO CHEW
324.0000 mg | CHEWABLE_TABLET | Freq: Every day | ORAL | Status: DC
  Administered 2016-04-09 – 2016-04-28 (×20): 324 mg via ORAL
  Filled 2016-04-09 (×22): qty 4

## 2016-04-09 MED ORDER — PREGABALIN 75 MG PO CAPS
75.0000 mg | ORAL_CAPSULE | Freq: Every day | ORAL | Status: DC
  Administered 2016-04-09 – 2016-04-10 (×2): 75 mg via ORAL
  Filled 2016-04-09 (×2): qty 1

## 2016-04-09 MED ORDER — MELATONIN 3 MG PO TABS
3.0000 mg | ORAL_TABLET | Freq: Every day | ORAL | Status: DC
  Administered 2016-04-09: 3 mg
  Filled 2016-04-09: qty 1

## 2016-04-09 NOTE — Evaluation (Signed)
Physical Therapy Assessment and Plan  Patient Details  Name: Shelly Mcdaniel MRN: [id number] Date of Birth: August 07, 1955  PT Diagnosis: Abnormal posture, Abnormality of gait, Difficulty walking, Hemiparesis non-dominant, Hypertonia, Impaired cognition, Impaired sensation and Muscle weakness Rehab Potential: Good ELOS: 17-19 days    Today's Date: 04/09/2016 PT Individual Time: 1545-1701 PT Individual Time Calculation (min): 76 min     Problem List:  Patient Active Problem List   Diagnosis Date Noted  . Orthostatic hypotension 04/08/2016  . Muscle cramps 04/08/2016  . Right pontine cerebrovascular accident ([place]) 04/08/2016  . Spastic hemiparesis of left nondominant side ([place]) 04/08/2016  . Cerebral thrombosis with cerebral infarction 04/06/2016  . Basilar artery stenosis   . Dysarthria 04/05/2016  . History of CVA with residual deficit 04/05/2016  . Sinus tachycardia 04/05/2016  . Acute encephalopathy 04/05/2016  . Chronic pain 04/05/2016  . Hyperlipidemia 04/05/2016  . Ischemic stroke ([place])   . 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 TIA (transient ischemic attack) 11/07/2014  . Uncontrolled hypertension 11/07/2014  . DM II (diabetes mellitus, type II), controlled (Shelly Mcdaniel) 11/07/2014    Past Medical History:  Past Medical History:  Diagnosis Date  . Diabetes mellitus without complication (Shelly Mcdaniel)   . Hyperlipidemia   . Hypertension    Past Surgical History:  Past Surgical History:  Procedure Laterality Date  . BACK SURGERY    . BREAST SURGERY    . COLONOSCOPY WITH PROPOFOL N/A 12/24/2013   Procedure: COLONOSCOPY WITH PROPOFOL;  Surgeon: [name], MD;  Location: [facility];  Service: Endoscopy;  Laterality: N/A;    Assessment & Plan Clinical Impression: Patient is a 60 y.o.right handed femalewith history of hypertension, diabetes mellitus, history of falls, CVA August 2017while vacationing at [place] and received therapies at [facility] residual left-sided weakness  maintained on aspirin.Per chart review patient lives with spouse. Used a quad cane prior to admission. She had recently been receiving outpatient therapies.Presented 04/05/2016 with altered mental status and dysarthria. Patient had a fall in March with resultant persistent left rib cage pain and medications adjusted for pain started on baclofen in September, Lyrica in August had been on Neurontin without any significant dosage changes. Urine drug screen negative. In the ED patient tachycardic 076-8:08 systolic pressure in the 811S. CT scan negative for acute changes. MRI showed foci of diffusion restriction within the right hemi-pons and abnormal signal in the middle cerebellar peduncle most likely representing acute early subacute ischemia. No hemorrhage. MRA with no large vessel occlusion or aneurysm. Patient did not receive TPA. EEG with generalized slowing pattern nonspecific consistent with global encephalopathic process Patient transferred to CIR on 04/08/2016 .   Patient currently requires max with mobility secondary to muscle weakness, decreased cardiorespiratoy endurance, abnormal tone, motor apraxia, decreased coordination and decreased motor planning, decreased attention to left, decreased initiation, decreased attention, decreased awareness, decreased problem solving, decreased safety awareness and delayed processing and decreased sitting balance, decreased standing balance, decreased postural control, hemiplegia and decreased balance strategies.  Prior to hospitalization, patient was min with mobility and lived with Spouse in a House home.  Home access is  Ramped entrance.  Patient will benefit from skilled PT intervention to maximize safe functional mobility, minimize fall risk and decrease caregiver burden for planned discharge home with 24 hour assist.  Anticipate patient will benefit from follow up [facility] at discharge.  PT - End of Session Activity Tolerance: Tolerates 10 - 20 min activity with  multiple rests Endurance Deficit: Yes PT Assessment Rehab Potential (ACUTE/IP ONLY):  Good Barriers to Discharge: Decreased caregiver support PT Patient demonstrates impairments in the following area(s): Balance;Behavior;Endurance;Motor;Pain;Perception;Safety;Sensory PT Transfers Functional Problem(s): Bed Mobility;Bed to Chair;Car;Furniture PT Locomotion Functional Problem(s): Ambulation;Wheelchair Mobility;Stairs PT Plan PT Intensity: Minimum of 1-2 x/day ,45 to 90 minutes PT Frequency: 5 out of 7 days PT Duration Estimated Length of Stay: 17-19 days  PT Treatment/Interventions: Ambulation/gait training;Balance/vestibular training;Community reintegration;Discharge planning;Disease management/prevention;DME/adaptive equipment instruction;Functional electrical stimulation;Functional mobility training;Neuromuscular re-education;Pain management;Patient/family education;Psychosocial support;Skin care/wound management;Splinting/orthotics;Stair training;Therapeutic Activities;Therapeutic Exercise;UE/LE Coordination activities;Visual/perceptual remediation/compensation;Wheelchair propulsion/positioning;UE/LE Strength taining/ROM PT Transfers Anticipated Outcome(s): Min Assist with LRAD  PT Locomotion Anticipated Outcome(s): Min Assist with LRAD for household distances.  PT Recommendation Follow Up Recommendations: Home health PT Patient destination: Home Equipment Recommended: To be determined    Skilled Therapeutic Intervention  Patient received supine in bed and agreeable to PT. PT assisted with max Assist toilet transfer to attempt void and return to bed with stand pivot sit<>supine to allow bladder scan following unsuccessful void. PT instructed patient in Evaluaiton and intitiated treatment following bladder scan as list below. PT instructed patient in bed mobillity, transfers, gait on level surface with rail in hall, and car transfer with max assist. Patient and family educated in potential  LOS, goals of rehab, treatment plan and discharge planning. Patient left sitting in recliner following stedy transfer  With mod-max Assist.  All needs met and family present at end of session.   PT Evaluation Precautions/Restrictions Precautions Precautions: Fall Precaution Comments: painful left arm with moderate left hemiparesis  Required Braces or Orthoses: Other Brace/Splint Other Brace/Splint: resting hand splint for the left hand at night Restrictions Weight Bearing Restrictions: No General   Vital Signs Pain   Home Living/Prior Functioning Home Living Available Help at Discharge: Family;Available 24 hours/day Type of Home: House Home Access: Ramped entrance Home Layout: One level;Able to live on main level with bedroom/bathroom Bathroom Shower/Tub: Tub/shower unit;Curtain Bathroom Toilet: Standard Additional Comments: patient has custom made splint for L UE that has been off for x1 week. 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 Comments: AFO; 8/4; min assist with transfers and gait PLOF Vision/Perception     Cognition Overall Cognitive Status: Impaired/Different from baseline Arousal/Alertness: Awake/alert Orientation Level: Oriented to place;Oriented to person Attention: Focused;Sustained Focused Attention: Appears intact Sustained Attention: Impaired Sustained Attention Impairment: Functional basic Memory: Impaired Memory Impairment: Storage deficit;Retrieval deficit Awareness: Impaired Awareness Impairment: Intellectual impairment Problem Solving: Impaired Problem Solving Impairment: Functional basic Executive Function:  (all impaired due to lower level deficits) Behaviors: Restless Safety/Judgment:  Impaired Comments: Pt with decreased ability to initiate and follow simple one step commands related to bathing or dressing.   Sensation Sensation Light Touch: Appears Intact (Light touch intact in LUE grossly, will test further in treatment) Stereognosis: Not tested Hot/Cold: Not tested Proprioception: Not tested Additional Comments: Pt with increased pain in the LUE from digits to shoulder with all attempted passive movement.  Coordination Gross Motor Movements are Fluid and Coordinated: No Fine Motor Movements are Fluid and Coordinated: No Coordination and Movement Description: Pt with no active movement noted in the LUE at this time.  Did not attempt to initiate any functional use of the LUE during session.  Guarding secondary to increased pain with all PROM.  Motor  Motor Motor: Abnormal postural alignment and control;Hemiplegia Motor - Skilled Clinical Observations:  Moderate left sided weakness noted.  Increased lean posteriorly in sitting with posterior pelvic tilt and left trunk passive elongation.   Mobility Bed Mobility Bed Mobility: Supine to Sit;Sit to Supine;Rolling Right;Rolling Left Rolling Right: 2: Max assist Rolling Right Details: Tactile cues for placement;Tactile cues for weight bearing;Visual cues/gestures for precautions/safety;Visual cues/gestures for sequencing;Verbal cues for technique;Verbal cues for precautions/safety;Manual facilitation for placement;Verbal cues for safe use of DME/AE;Verbal cues for sequencing Rolling Left: 3: Mod assist Rolling Left Details: Visual cues/gestures for precautions/safety;Visual cues/gestures for sequencing;Verbal cues for technique;Verbal cues for precautions/safety;Verbal cues for safe use of DME/AE;Manual facilitation for weight shifting;Verbal cues for gait pattern;Manual facilitation for placement;Tactile cues for placement;Tactile cues for initiation Supine to Sit: 2: Max assist;HOB flat Supine to Sit Details: Manual  facilitation for weight shifting;Manual facilitation for weight bearing;Verbal cues for sequencing Supine to Sit Details (indicate cue type and reason): Pt needed assistance for all aspects including rolling to the right side, transitioning LEs off of bed, and bringing trunk up to sitting.    Sit to Supine: 2: Max assist;HOB flat Sit to Supine - Details: Manual facilitation for weight shifting;Manual facilitation for weight bearing Sit to Supine - Details (indicate cue type and reason): Max assist for transitioning UB down to bed on the left side as well as for lifting LEs into the bed and transitioning to supine.  Transfers Sit to Stand: 3: Mod assist;With armrests;2: Max assist Sit to Stand Details: Manual facilitation for weight bearing;Manual facilitation for weight shifting;Verbal cues for sequencing Stand to Sit: 3: Mod assist;With armrests;2: Max assist Stand to Sit Details (indicate cue type and reason): Manual facilitation for weight shifting;Verbal cues for sequencing Locomotion  Ambulation Ambulation: Yes Ambulation/Gait Assistance: 2: Max assist;1: +2 Total assist (with WC follow) Ambulation Distance (Feet): 10 Feet Assistive device: Other (Comment) (Rail in hall) Ambulation/Gait Assistance Details: Verbal cues for technique;Verbal cues for precautions/safety;Verbal cues for safe use of DME/AE;Manual facilitation for weight shifting;Manual facilitation for placement;Manual facilitation for weight bearing;Tactile cues for sequencing;Tactile cues for weight shifting;Tactile cues for weight beaing;Tactile cues for placement;Visual cues/gestures for precautions/safety;Visual cues/gestures for sequencing;Visual cues for safe use of DME/AE;Verbal cues for sequencing;Verbal cues for gait pattern Gait Gait: Yes Gait Pattern: Impaired Gait Pattern: Step-to pattern;Decreased step length - left;Left genu recurvatum;Left circumduction Stairs / Additional Locomotion Stairs: No Wheelchair  Mobility Wheelchair Mobility: No  Trunk/Postural Assessment  Cervical Assessment Cervical Assessment: Exceptions to [facility] (cervical flexion noted) Thoracic Assessment Thoracic Assessment: Exceptions to [facility] (thoracic rounding with left trunk elongation and right trunk shortening.) Lumbar Assessment Lumbar Assessment: Exceptions to [facility] (maintains posterior pelvic tilt) Postural Control Postural Control: Deficits on evaluation (increased posterior and left LOB without adequate correction)  Balance Balance Balance Assessed: Yes Static Sitting Balance Static Sitting - Balance Support: Right upper extremity supported;Feet supported Static Sitting - Level of Assistance: 4: Min assist Dynamic Sitting Balance Dynamic Sitting - Balance Support: Feet supported Dynamic Sitting - Level of Assistance: 3: Mod assist Static Standing Balance Static Standing - Balance Support: Right upper extremity supported;During functional activity Static Standing - Level of Assistance: 2: Max assist (increased posterior lean) Dynamic Standing Balance Dynamic Standing - Balance Support: During functional activity Dynamic Standing - Level of Assistance: 2: Max assist Extremity Assessment  RUE Assessment RUE Assessment: Within Functional Limits (WFLs for bed mobility and selfcare tasks but not formally assessed. ) LUE Assessment LUE Assessment: Exceptions to [facility] LUE Strength LUE Overall Strength Comments: Pt with increased pain in all joints of the LUE.  hand  maintains digit flexion with not being able to complete full PROM digit extension secondary to pain.  Elbow flexion/extension limited as well secondary to pain but pt able to tolerate 0-90 degrees approximately.  Shoulder flexion PROM only 0-50 degrees with increased pain at this time.  Increased flexor tone noted in digits and elbow.  Inferior subluxation present as well.  Will continue to monitor and further evaluate in session.   RLE Assessment RLE Assessment:  Exceptions to [facility] RLE AROM (degrees) RLE Overall AROM Comments: WFL RLE Strength RLE Overall Strength Comments: 4/5 throughout LE. decreased initiation without constant cues for remain engaged.  LLE Assessment LLE Assessment: Exceptions to WFL LLE AROM (degrees) LLE Overall AROM Comments: lacking 75% hip flexion and 50% knee extension. able to achieve full ankle PF, but only trace ankle DF present.  LLE PROM (degrees) LLE Overall PROM Comments: WFL LLE Strength LLE Overall Strength Comments: 2/5 hip fleixon and knee extension/flexion (intermittently due to poor motor planning/apraxia) 2+/5 ankle PF and hip adduction and abduction.    See Function Navigator for Current Functional Status.   Refer to Care Plan for Long Term Goals  Recommendations for other services: None  Discharge Criteria: Patient will be discharged from PT if patient refuses treatment 3 consecutive times without medical reason, if treatment goals not met, if there is a change in medical status, if patient makes no progress towards goals or if patient is discharged from hospital.  The above assessment, treatment plan, treatment alternatives and goals were discussed and mutually agreed upon: by patient and by family  Lorie Phenix 04/09/2016, 6:22 PM

## 2016-04-09 NOTE — Progress Notes (Signed)
Patient vomited brown/black bile looking. MD notified. Orders for KUB following NG placement.

## 2016-04-09 NOTE — Progress Notes (Addendum)
Initial Nutrition Assessment  DOCUMENTATION CODES:  Not applicable  INTERVENTION:  Initiate nocturnal tube feeding via NGT.   10 pm- 8 am. Jevity1.5 @ 70 ml/hr + Prostat 30 mls. The first night, begin @30  ml/hr and advance by 10 ml/hr to goal. 2nd night, start at goal. 240 ml flush of free water before and after TF regimen   Tube feeding regimen provides 1150 kcal (65% of needs), 60 grams of protein (80% needs), and 1012 ml of fluid (w/ flushes)  (56% needs)  RD to follow up next and can alter TF to eat more or less of needs according to pts PO intake.   NUTRITION DIAGNOSIS:  Inadequate oral intake related to lethargy/confusion, nausea as evidenced by No meal intake x 4 days.  GOAL:  Patient will meet greater than or equal to 90% of their needs  MONITOR:  Diet advancement, Labs, I & O's, TF tolerance  REASON FOR ASSESSMENT:  Consult Enteral/tube feeding initiation and management  ASSESSMENT:  60 y/o female PMHx DM, HLD, HTN. She experienced a Stroke in August. She presented yesterday with AMS and dysarthria. MRI showed middle cerebellar peduncle likely representing early subcute ischemia. Admitted for comprehensive rehab.   RD consulted for cortrak placement with nocturnal TF.  Patient has been refusing meds as well as having poor PO intake.  Husband reports that this began 4 days ago. At that time she started to turn away meds and food; She has not has med or significant PO intake since that time. Prior to that, she was at baseline  Pt has DM and husband reports her A1C recently dropped from 13 to 6. Last a1c on file was 4 days ago and was 6.7  Since her stroke in August, husband reports she has maintained weight very well and has only lost 3 lbs since that time. There is a documented weight of 147, but unclear where this came from. It is large outlier. Overall her weight does appear stable this year.  It is currently unclear why she wont eat/take meds. She has been having  nausea and vomited bile today. There was concern of aspiration and SLP evaluating today. She currently has been deemed not safe to swallow. MD requested the tube be used for nocturnal TF as well as meds. Currently, she does have a diet order and is to be encouraged to eat during day. TF to meet 50-75% needs.   Cortrak was successfully placed with gastric access. However, pt had to be restrained to place and was very adverse to receiving the tube. She is at high risk for tube displacement.    NFPE: WDL  Medications:Miralax, insulin, pepcid, Zofran PRN Labs: CBGs: 100-140, LDL: 110, HDL, 29   Recent Labs Lab 16/03/9609/24/17 1129 04/05/16 1620 04/08/16 1039 04/08/16 1909  NA 140 142 139  --   K 3.9 3.9 3.5  --   CL 101 106 104  --   CO2  --  25 24  --   BUN 11 8 14   --   CREATININE 0.40* 0.53 0.73 0.68  CALCIUM  --  10.2 9.8  --   MG  --   --  1.7  --   GLUCOSE 151* 137* 160*  --    Diet Order:  Diet Carb Modified Fluid consistency: Thin; Room service appropriate? Yes  Skin:  Reviewed, no issues  Last BM:  10/24  Height:  Ht Readings from Last 1 Encounters:  04/08/16 5\' 7"  (1.702 m)   Weight:  Wt Readings from Last 1 Encounters:  04/08/16 168 lb 6.4 oz (76.4 kg)   Wt Readings from Last 10 Encounters:  04/08/16 168 lb 6.4 oz (76.4 kg)  04/05/16 161 lb 9.6 oz (73.3 kg)  01/15/16 165 lb 12.8 oz (75.2 kg)  08/20/15 168 lb (76.2 kg)  05/27/15 170 lb (77.1 kg)  05/04/15 166 lb (75.3 kg)  04/28/15 172 lb (78 kg)  11/08/14 188 lb 15 oz (85.7 kg)  12/24/13 191 lb (86.6 kg)  08/09/13 198 lb 9.6 oz (90.1 kg)  Dosing weight: 161.6 (73.3 kg)  Ideal Body Weight:  61.36 kg  BMI:  Body mass index is 26.38 kg/m.  Estimated Nutritional Needs:  Kcal:  1750-1900 (24-26 kcal/kg bw) Protein:  73-88g (1-1.2 g/kg bw) Fluid:  1.8-2 Liters  EDUCATION NEEDS:  No education needs identified at this time  Christophe LouisNathan Warnie Belair RD, LDN, CNSC Clinical Nutrition Pager: [phone] 04/09/2016 11:40  AM

## 2016-04-09 NOTE — Evaluation (Signed)
Speech Language Pathology Assessment and Plan  Patient Details  Name: Shelly Mcdaniel MRN: [id number] Date of Birth: Aug 30, 1955  SLP Diagnosis: Cognitive Impairments  Rehab Potential: Fair ELOS: 14-21 days     Today's Date: 04/09/2016 SLP Individual Time: 1110-1200 SLP Individual Time Calculation (min): 50 min    Problem List: Patient Active Problem List   Diagnosis Date Noted  . Orthostatic hypotension 04/08/2016  . Muscle cramps 04/08/2016  . Right pontine cerebrovascular accident ([name]) 04/08/2016  . Spastic hemiparesis of left nondominant side ([name]) 04/08/2016  . Cerebral thrombosis with cerebral infarction 04/06/2016  . Basilar artery stenosis   . Dysarthria 04/05/2016  . History of CVA with residual deficit 04/05/2016  . Sinus tachycardia 04/05/2016  . Acute encephalopathy 04/05/2016  . Chronic pain 04/05/2016  . Hyperlipidemia 04/05/2016  . Ischemic stroke ([name])   . 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 TIA (transient ischemic attack) 11/07/2014  . Uncontrolled hypertension 11/07/2014  . DM II (diabetes mellitus, type II), controlled ([name]) 11/07/2014   Past Medical History:  Past Medical History:  Diagnosis Date  . Diabetes mellitus without complication ([name])   . Hyperlipidemia   . Hypertension    Past Surgical History:  Past Surgical History:  Procedure Laterality Date  . BACK SURGERY    . BREAST SURGERY    . COLONOSCOPY WITH PROPOFOL N/A 12/24/2013   Procedure: COLONOSCOPY WITH PROPOFOL;  Surgeon: [name], MD;  Location: [facility];  Service: Endoscopy;  Laterality: N/A;    Assessment / Plan / Recommendation Clinical Impression   Shelly Mcdaniel is a 60 y.o. right handed female with history of hypertension, diabetes mellitus, history of falls, CVA August 2017 while vacationing at [facility] and received therapies at [facility] with residual left-sided weakness maintained on aspirin.  She had recently been receiving outpatient therapies. Presented 04/05/2016 with  altered mental status and dysarthria. Patient had a fall in March with resultant persistent left rib cage pain and medications adjusted for pain started on baclofen in September, Lyrica in August had been on Neurontin without any significant dosage changes. Urine drug screen negative. MRI showed foci of diffusion restriction within the right hemi-pons and abnormal signal in the middle cerebellar peduncle most likely representing acute early subacute ischemia. No hemorrhage.  EEG with generalized slowing pattern nonspecific consistent with global encephalopathic process. No seizure activity noted. Tolerating a regular consistency diet. Patient was admitted for a comprehensive rehabilitation program on10/27/2017.  SLP evaluation completed on 04/09/2016 with the following results:  Pt presents with severe cognitive impairment characterized by decreased task initiation and susatined attention to tasks which impacts all higher level cognitive processes.  Furthermore, pt presents with significant anxiety and is averse to most care being provided for her which further exacerbates her level of impairment as she is unable to complete most tasks for herself at this time due to the abovementioned deficits.  As a result pt would benefit from skilled ST while inpatient in order to maximize functional independence and reduce burden of care prior to discharge.  Anticipate that pt will need 24/7 supervision at discharge in addition to [place] follow up at next level of care.       Skilled Therapeutic Interventions          Cognitive-linguistic evaluation completed with results and recommendations reviewed with family.  Pt needed max to total assist for task initiation and sequencing during basic, familiar self care tasks due to attention and initiation impairment.  SLP attempted a bedside swallow evaluation  in addition to cognitive assessment; however, pt was orally averse not only to any POs presented to her but also to basic oral  care, even when provided with extensive education and encouragement regarding rationale for ST interventions.  Discussed pt's current level of function with her husband who was present intermittently througout therapy session, including recommended goals and the importance of addressing cognitive dysfunction as it appears to be impacting all other functional aspects of her rehabilitation (i.e. self care, mobility, swallowing).    SLP Assessment  Patient will need skilled [place] [facility] Services during CIR admission    Recommendations  Recommendations for Other Services: Neuropsych consult Patient destination: Home Follow up Recommendations: Home Health SLP;Outpatient SLP;24 hour supervision/assistance Equipment Recommended: None recommended by SLP    SLP Frequency 3 to 5 out of 7 days   SLP Duration  SLP Intensity  SLP Treatment/Interventions 14-21 days   Minumum of 1-2 x/day, 30 to 90 minutes  Cognitive remediation/compensation;Cueing hierarchy;Functional tasks;Environmental controls;Internal/external aids;Patient/family education    Pain Pain Assessment Pain Assessment: 0-10 Pain Score: 10-Worst pain ever Pain Type: Acute pain Pain Location: Leg Pain Orientation: Right Pain Descriptors / Indicators: Spasm Pain Intervention(s): Repositioned;Distraction  Prior Functioning Cognitive/Linguistic Baseline: Within functional limits Type of Home: House  Lives With: Spouse Available Help at Discharge: Family;Available 24 hours/day  Function:  Eating Eating Eating activity did not occur: Refused (pt orally averse to all offered PO and oral care)               Cognition Comprehension Comprehension assist level: Understands basic 25 - 49% of the time/ requires cueing 50 - 75% of the time  Expression   Expression assist level: Expresses basic 25 - 49% of the time/requires cueing 50 - 75% of the time. Uses single words/gestures.  Social Interaction Social  Interaction assist level: Interacts appropriately less than 25% of the time. May be withdrawn or combative.  Problem Solving Problem solving assist level: Solves basic less than 25% of the time - needs direction nearly all the time or does not effectively solve problems and may need a restraint for safety  Memory Memory assist level: Recognizes or recalls less than 25% of the time/requires cueing greater than 75% of the time   Short Term Goals: Week 1: SLP Short Term Goal 1 (Week 1): Pt will sustain her attention to basic, familiar tasks for 1 minute intervals with mod assist multimodal cues for redirection.   SLP Short Term Goal 2 (Week 1): Pt will intiate and complete basic, familiar tasks with no more than 5 verbal cues for sequencing and organization.  SLP Short Term Goal 3 (Week 1): Pt will utilize external aids to orient to place, date, and situation with mod assist verbal and visual cues.   SLP Short Term Goal 4 (Week 1): Pt will utilize call bell to convey needs and wants to caregivers with max assist multimodal cues.   Refer to Care Plan for Long Term Goals  Recommendations for other services: None  Discharge Criteria: Patient will be discharged from SLP if patient refuses treatment 3 consecutive times without medical reason, if treatment goals not met, if there is a change in medical status, if patient makes no progress towards goals or if patient is discharged from hospital.  The above assessment, treatment plan, treatment alternatives and goals were discussed and mutually agreed upon: by patient and by family  Emilio Math 04/09/2016, 12:53 PM

## 2016-04-09 NOTE — Progress Notes (Signed)
[facility]IC[facility] RE[facility]ABILIT[facility]     PROGRESS NOTE    Subjective/Complaints: Still not eating (cognition/anxiety). Didn't sleep well due to spasms. Finally fell asleep after robaxin early this morning.  ROS limited due to cognition  Objective: Vital Signs: Blood pressure (!) 152/100, pulse (!) 115, temperature 98.9 F (37.2 C), temperature source Axillary, resp. rate 20, height 5\' 7"  (1.702 m), weight 76.4 kg (168 lb 6.4 oz), SpO2 100 %. No results found.  Recent Labs  04/08/16 1909  WBC 5.1  HGB 12.8  HCT 36.5  PLT 189    Recent Labs  04/08/16 1039 04/08/16 1909  NA 139  --   K 3.5  --   CL 104  --   GLUCOSE 160*  --   BUN 14  --   CREATININE 0.73 0.68  CALCIUM 9.8  --    CBG (last 3)   Recent Labs  04/08/16 1121 04/08/16 1624 04/08/16 2019  GLUCAP 136* 126* 103*    Wt Readings from Last 3 Encounters:  04/08/16 76.4 kg (168 lb 6.4 oz)  04/05/16 73.3 kg (161 lb 9.6 oz)  01/15/16 75.2 kg (165 lb 12.8 oz)    Physical Exam:  Constitutional: lying comfortably in bed HENT:  Head: Normocephalic and atraumatic.  Right Ear: External ear normal.  Left Ear: External ear normal.  Mouth/Throat: Oropharynx is clear and moist.  Eyes: Conjunctivae and EOM are normal. Pupils are equal, round, and reactive to light. No scleral icterus.  Neck: No JVD present. No tracheal deviation present. No thyromegaly present.  Cardiovascular: Normal rate, regular rhythm and intact distal pulses.  Exam reveals no friction rub.   No murmur heard. Respiratory: Effort normal. No respiratory distress. She has no wheezes. She has no rales. She exhibits no tenderness.  GI: She exhibits no distension. There is no tenderness. There is no rebound.  Musculoskeletal: She exhibits no edema. Tenderness: left shoulder still tender with PROM. 1/2" sublux.  Neurological: She is alert and oriented to person, place, and time. She displays abnormal reflex (LUE and LLE 3+). A cranial  nerve deficit (left central 7 and tongue deviation.) is present. She exhibits abnormal muscle tone (MAS Left shoulder adductors 2-3/4. biceps 2/4, wrist/finger flexors 2/4. 1/4 knee flexors/plantrar flexors. 4-5 beats of clonus at left ankle--no change). Coordination normal.  RUE and RLE 4 to 4+/5. LUE underlying 1/4 deltoid, biceps, triceps, wrist and finger flexors.  LLE 1+ HF, KE and 0/5 ADF/PF.--no change   Skin: No rash noted. No erythema. No pallor.  Psychiatric: She has a normal mood and affect.  Slow to process. Appears slightly anxious  Assessment/Plan: 1. 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 Physiatrist is providing close team supervision and 24 hour management of active medical problems listed below. Physiatrist and rehab team continue to assess barriers to discharge/monitor patient progress toward functional and medical goals.  Function:  Bathing Bathing position      Bathing parts      Bathing assist        Upper Body Dressing/Undressing Upper body dressing                    Upper body assist        Lower Body Dressing/Undressing Lower body dressing  Lower body assist        Toileting Toileting          Toileting assist     Transfers Chair/bed Optician, dispensingtransfer             Locomotion Ambulation           Wheelchair          Cognition Comprehension Comprehension assist level: Understands basic 50 - 74% of the time/ requires cueing 25 - 49% of the time  Expression Expression assist level: Expresses basic 25 - 49% of the time/requires cueing 50 - 75% of the time. Uses single words/gestures.  Social Interaction Social Interaction assist level: Interacts appropriately 25 - 49% of time - Needs frequent redirection.  Problem Solving Problem solving  assist level: Solves basic 25 - 49% of the time - needs direction more than half the time to initiate, plan or complete simple activities  Memory Memory assist level: Recognizes or recalls less than 25% of the time/requires cueing greater than 75% of the time   Medical Problem List and Plan: 1.  Left spastic hemiplegia/dysarthria secondary to right pontine infarct with recent extension             -begin therapies today as tolerated 2.  DVT Prophylaxis/Anticoagulation: Lovenox. Monitor for any bleeding episode 3. Pain Management with recent fall and persistent rib cage pain. Also has had prominent spasms/cramping at night  -Lidoderm patch, Baclofen10 mg 3 times a day, Robaxin as needed.(have added IV robaxin also)  -resume lyrica (home medication) at 75mg  qhs (was using 75mg  in am and 150mg  in pm at home) 4. Mood: Zoloft 50 mg daily, 5. Neuropsych: This patient is capable of making decisions on her own behalf.  -add low dose rozerem to assist with sleep (on melatonin at home) 6. Skin/Wound Care: Routine skin checks 7. 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 Hemoglobin A1c 6.7.  Check blood sugars before meals and at bedtime  -sugars low given sparce po intake  -decrease levemir to 15mg  QHS tonight  -Patient also Glucophage 1000 mg BID PTA and resume as needed. 9.Hypertension with bouts of  orthostasis. Presently on Norvasc 5 mg daily. Patient also on lisinopril 40 mg daily as well as Accuretic prior to admission. Resume as needed 10 Hyperlipidemia .Zocor 40 mg daily 12. Spastic left hemiparesis, acute on chronic             -baclofen just resumed on acute                         -titrate to tolerance and effect while on rehab             -consider botox injections             -continue splinting/ROM 13. Poor po intake/altered  mentation  -family would like NGT placed---I agree for now to allow her better nutrition and administration of medications  -begin TF at night---encourage PO during the day  -?constipation---review KUB after line placement  I spent extensive time reviewing case with husband today. In total 30-40 minutes were spent.    LOS (Days) 1 A FACE TO FACE EVALUATION WAS PERFORMED  Majel Giel T 04/09/2016 9:23 AM

## 2016-04-09 NOTE — Evaluation (Signed)
Occupational Therapy Assessment and Plan  Patient Details  Name: Shelly Mcdaniel MRN: [id number] Date of Birth: Oct 19, 1955  OT Diagnosis: abnormal posture, hemiplegia affecting non-dominant side and muscle weakness (generalized) Rehab Potential: Rehab Potential (ACUTE ONLY): Good ELOS: 17-19 days   Today's Date: 04/09/2016 OT Individual Time: 9211-9417 OT Individual Time Calculation (min): 50 min      Problem List: Patient Active Problem List   Diagnosis Date Noted  . Orthostatic hypotension 04/08/2016  . Muscle cramps 04/08/2016  . Right pontine cerebrovascular accident ([place]) 04/08/2016  . Spastic hemiparesis of left nondominant side ([place]) 04/08/2016  . Cerebral thrombosis with cerebral infarction 04/06/2016  . Basilar artery stenosis   . Dysarthria 04/05/2016  . History of CVA with residual deficit 04/05/2016  . Sinus tachycardia 04/05/2016  . Acute encephalopathy 04/05/2016  . Chronic pain 04/05/2016  . Hyperlipidemia 04/05/2016  . Ischemic stroke ([place])   . 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 TIA (transient ischemic attack) 11/07/2014  . Uncontrolled hypertension 11/07/2014  . DM II (diabetes mellitus, type II), controlled ([place]) 11/07/2014    Past Medical History:  Past Medical History:  Diagnosis Date  . Diabetes mellitus without complication ([place]helly Mcdaniel)   . Hyperlipidemia   . Hypertension    Past Surgical History:  Past Surgical History:  Procedure Laterality Date  . BACK SURGERY    . BREAST SURGERY    . COLONOSCOPY WITH PROPOFOL N/A 12/24/2013   Procedure: COLONOSCOPY WITH PROPOFOL;  Surgeon: [name], MD;  Location: [facility];  Service: Endoscopy;  Laterality: N/A;    Assessment & Plan Clinical Impression: Patient is a 60 y.o. year old female with recent admission to the hospital on 04/05/2016 with altered mental status and dysarthria. Patient had a fall in March with resultant persistent left rib cage pain and medications adjusted for pain started on baclofen in  September, Lyrica in August had been on Neurontin without any significant dosage changes. Urine drug screen negative. In the ED patient tachycardic 408-1:44 systolic pressure in the 818H. CT scan negative for acute changes. MRI showed foci of diffusion restriction within the right hemi-pons and abnormal signal in the middle cerebellar peduncle most likely representing acute early subacute ischemia   Patient transferred to CIR on 04/08/2016 .    Patient currently requires max with basic self-care skills secondary to muscle weakness, abnormal tone, unbalanced muscle activation, decreased coordination and decreased motor planning, decreased initiation, decreased attention, decreased awareness, decreased problem solving, decreased safety awareness, decreased memory and delayed processing and decreased sitting balance, decreased standing balance, decreased postural control, hemiplegia and decreased balance strategies.  Prior to hospitalization, patient could complete ADLs with min.  Patient will benefit from skilled intervention to decrease level of assist with basic self-care skills and increase independence with basic self-care skills prior to discharge home with care partner.  Anticipate patient will require 24 hour supervision and minimal physical assistance and follow up outpatient.  OT - End of Session Activity Tolerance: Decreased this session Endurance Deficit: Yes OT Assessment Rehab Potential (ACUTE ONLY): Good OT Patient demonstrates impairments in the following area(s): Balance;Cognition;Endurance;Motor;Pain;Perception;Safety OT Basic ADL's Functional Problem(s): Grooming;Bathing;Dressing;Toileting;Eating OT Transfers Functional Problem(s): Tub/Shower;Toilet OT Additional Impairment(s): Fuctional Use of Upper Extremity OT Plan OT Intensity: Minimum of 1-2 x/day, 45 to 90 minutes OT Frequency: 5 out of 7 days OT Duration/Estimated Length of Stay: 17-19 days OT Treatment/Interventions:  Balance/vestibular training;Cognitive remediation/compensation;Community reintegration;DME/adaptive equipment instruction;Disease mangement/prevention;Discharge planning;Functional electrical stimulation;Functional mobility training;Neuromuscular re-education;Psychosocial support;Patient/family education;Pain management;Self Care/advanced ADL retraining;Splinting/orthotics;UE/LE Strength taining/ROM;Therapeutic Exercise;Therapeutic Activities;UE/LE  Coordination activities;Wheelchair propulsion/positioning OT Self Feeding Anticipated Outcome(s): supervision OT Basic Self-Care Anticipated Outcome(s): min assist OT Toileting Anticipated Outcome(s): min assist OT Bathroom Transfers Anticipated Outcome(s): min assist OT Recommendation Patient destination: Home Follow Up Recommendations: Outpatient OT Equipment Recommended: None recommended by OT   Skilled Therapeutic Intervention Began education on selfcare retraining with pt's spouse present.  Pt with decreased sustained attention and decreased initiation to verbal command regarding bathing task.  Needed max to total assist +2 for all selfcare this session.  Pt's spouse able to provide PLOF information since last CVA.  Pt with increased pain with all passive movement in the LUE with tone in the wrist and digits noted.  Max assist for stand pivot transfer to the wheelchair and back to the bed.  Pt left in bed at end of session secondary to needing placement of NG tube.  Positioned LUE on pillows in bed and discussed expectations for therapy goals and ELOS.     OT Evaluation Precautions/Restrictions  Precautions Precautions: Fall Precaution Comments: painful left arm with moderate left hemiparesis  Required Braces or Orthoses: Other Brace/Splint Other Brace/Splint: resting hand splint for the left hand at night Restrictions Weight Bearing Restrictions: No  Pain  Pain 4/10 in the left arm  Home Living/Prior Functioning Home Living Available  Help at Discharge: Family, Available 24 hours/day Type of Home: House Home Access: Ramped entrance Home Layout: One level, Able to live on main level with bedroom/bathroom Bathroom Shower/Tub: Tub/shower unit, Curtain Bathroom Toilet: Standard Additional Comments: patient has custom made splint for L UE that has been off for x1 week. Pt has to Ridged AFO  Lives With: Spouse IADL History Current License: No Prior Function Level of Independence: Needs assistance with homemaking, Needs assistance with ADLs, Requires assistive device for independence  Able to Take Stairs?: Yes Driving: No Vocation: Student Vocation Requirements: currently taking nursing classes. Comments: AFO; 8/4; min assist with transfers and gait PLOF ADL  See Function section of chart for details  Vision/Perception  Vision- History Baseline Vision/History: Wears glasses Wears Glasses: At all times Vision- Assessment Vision Assessment?: Vision impaired- to be further tested in functional context  Cognition Overall Cognitive Status: Impaired/Different from baseline Arousal/Alertness: Awake/alert Orientation Level: Person;Situation;Place Person: Oriented Place: Disoriented Situation: Oriented Year: Other (Comment) (did not state) Month:  (did not state) Day of Week:  (did not state) Memory: Impaired Memory Impairment: Storage deficit;Retrieval deficit Immediate Memory Recall: Sock;Blue;Bed Memory Recall: Sock;Blue Memory Recall Sock: Without Cue Memory Recall Blue: Without Cue Attention: Focused;Sustained Focused Attention: Appears intact Sustained Attention: Impaired Sustained Attention Impairment: Functional basic Awareness: Impaired Awareness Impairment: Intellectual impairment Problem Solving: Impaired Problem Solving Impairment: Functional basic Executive Function:  (all impaired due to lower level deficits) Behaviors: Restless Safety/Judgment: Impaired Comments: Pt with decreased ability to  initiate and follow simple one step commands related to bathing or dressing.   Sensation Sensation Light Touch: Appears Intact (Light touch intact in LUE grossly, will test further in treatment) Stereognosis: Not tested Hot/Cold: Not tested Proprioception: Not tested Additional Comments: Pt with increased pain in the LUE from digits to shoulder with all attempted passive movement.  Coordination Gross Motor Movements are Fluid and Coordinated: No Fine Motor Movements are Fluid and Coordinated: No Coordination and Movement Description: Pt with no active movement noted in the LUE at this time.  Did not attempt to initiate any functional use of the LUE during session.  Guarding secondary to increased pain with all PROM.  Motor  Motor Motor: Abnormal  postural alignment and control;Hemiplegia Motor - Skilled Clinical Observations: Moderate left sided weakness noted.  Increased lean posteriorly in sitting with posterior pelvic tilt and left trunk passive elongation.  Mobility  Bed Mobility Bed Mobility: Supine to Sit;Sit to Supine Supine to Sit: 2: Max assist;HOB flat Supine to Sit Details: Manual facilitation for weight shifting;Manual facilitation for weight bearing;Verbal cues for sequencing Supine to Sit Details (indicate cue type and reason): Pt needed assistance for all aspects including rolling to the right side, transitioning LEs off of bed, and bringing trunk up to sitting.    Sit to Supine: 2: Max assist;HOB flat Sit to Supine - Details: Manual facilitation for weight shifting;Manual facilitation for weight bearing Sit to Supine - Details (indicate cue type and reason): Max assist for transitioning UB down to bed on the left side as well as for lifting LEs into the bed and transitioning to supine.  Transfers Transfers: Sit to Stand;Stand to Sit Sit to Stand: With upper extremity assist;3: Mod assist;With armrests Sit to Stand Details: Manual facilitation for weight bearing;Manual  facilitation for weight shifting;Verbal cues for sequencing Stand to Sit: 3: Mod assist;With armrests Stand to Sit Details (indicate cue type and reason): Manual facilitation for weight shifting;Verbal cues for sequencing  Trunk/Postural Assessment  Cervical Assessment Cervical Assessment: Exceptions to [facility] (cervical flexion noted) Thoracic Assessment Thoracic Assessment: Exceptions to [facility] (thoracic rounding with left trunk elongation and right trunk shortening.) Lumbar Assessment Lumbar Assessment: Exceptions to [facility] (maintains posterior pelvic tilt) Postural Control Postural Control: Deficits on evaluation (increased posterior and left LOB without adequate correction)  Balance Balance Balance Assessed: Yes Static Sitting Balance Static Sitting - Balance Support: Right upper extremity supported;Feet supported Static Sitting - Level of Assistance: 4: Min assist Dynamic Sitting Balance Dynamic Sitting - Balance Support: Feet supported Dynamic Sitting - Level of Assistance: 3: Mod assist Static Standing Balance Static Standing - Balance Support: Right upper extremity supported;During functional activity Static Standing - Level of Assistance: 2: Max assist (increased posterior lean) Dynamic Standing Balance Dynamic Standing - Balance Support: During functional activity Dynamic Standing - Level of Assistance: 2: Max assist Extremity/Trunk Assessment RUE Assessment RUE Assessment: Within Functional Limits (WFLs for bed mobility and selfcare tasks but not formally assessed. ) LUE Assessment LUE Assessment: Exceptions to [facility] LUE Strength LUE Overall Strength Comments: Pt with increased pain in all joints of the LUE.  hand maintains digit flexion with not being able to complete full PROM digit extension secondary to pain.  Elbow flexion/extension limited as well secondary to pain but pt able to tolerate 0-90 degrees approximately.  Shoulder flexion PROM only 0-50 degrees with increased pain  at this time.  Increased flexor tone noted in digits and elbow.  Inferior subluxation present as well.  Will continue to monitor and further evaluate in session.     See Function Navigator for Current Functional Status.   Refer to Care Plan for Long Term Goals  Recommendations for other services: None  Discharge Criteria: Patient will be discharged from OT if patient refuses treatment 3 consecutive times without medical reason, if treatment goals not met, if there is a change in medical status, if patient makes no progress towards goals or if patient is discharged from hospital.  The above assessment, treatment plan, treatment alternatives and goals were discussed and mutually agreed upon: by patient and by family  Keani Gotcher OTR/L 04/09/2016, 5:28 PM

## 2016-04-09 NOTE — Progress Notes (Addendum)
Patient woke up at around 4 am moaning and crying from pain/spasm. Tylenol 650 mg suppository given at 419 am. Husband called around 435 am and said that tylenol is not helping and requested immediate IV intervention for her spasm as patient refuses to take PO meds. MD notified with order received for robaxin IV.

## 2016-04-09 NOTE — Progress Notes (Signed)
Patient was complaining of lower abdominal pain during shift change, day shift rn made MD aware and received orders. In and out cath performed for ua/cs.  Patient refused all of her PO meds this shift which included her pain meds. MD was notified and orders received. Per husband, pt. has very poor po intake. Tylenol suppository given for generalized pain, repositioned in bed and patient was able to sleep for approximately 2 hours. Patient started crying at around midnight while husband tried to appease her. He said she also woke up last night crying and was not able to go back to sleep then. Ativan 0.5mg  IM prn given, husband ok with ativan. Rechecked patient at 2 a.m. And she is fast asleep. Will monitor.

## 2016-04-10 ENCOUNTER — Inpatient Hospital Stay (HOSPITAL_COMMUNITY): Payer: BC Managed Care – PPO | Admitting: Occupational Therapy

## 2016-04-10 DIAGNOSIS — E876 Hypokalemia: Secondary | ICD-10-CM

## 2016-04-10 LAB — CBC
HEMATOCRIT: 36.9 % (ref 36.0–46.0)
Hemoglobin: 12.7 g/dL (ref 12.0–15.0)
MCH: 29.8 pg (ref 26.0–34.0)
MCHC: 34.4 g/dL (ref 30.0–36.0)
MCV: 86.6 fL (ref 78.0–100.0)
PLATELETS: 179 10*3/uL (ref 150–400)
RBC: 4.26 MIL/uL (ref 3.87–5.11)
RDW: 13.2 % (ref 11.5–15.5)
WBC: 6.4 10*3/uL (ref 4.0–10.5)

## 2016-04-10 LAB — COMPREHENSIVE METABOLIC PANEL
ALK PHOS: 62 U/L (ref 38–126)
ALT: 13 U/L — AB (ref 14–54)
AST: 10 U/L — AB (ref 15–41)
Albumin: 3.5 g/dL (ref 3.5–5.0)
Anion gap: 7 (ref 5–15)
BILIRUBIN TOTAL: 0.7 mg/dL (ref 0.3–1.2)
BUN: 18 mg/dL (ref 6–20)
CALCIUM: 9.3 mg/dL (ref 8.9–10.3)
CO2: 24 mmol/L (ref 22–32)
CREATININE: 0.62 mg/dL (ref 0.44–1.00)
Chloride: 106 mmol/L (ref 101–111)
GFR calc Af Amer: >60 mL/min (ref >60)
GFR calc non Af Amer: >60 mL/min (ref >60)
GLUCOSE: 328 mg/dL — AB (ref 65–99)
POTASSIUM: 3.3 mmol/L — AB (ref 3.5–5.1)
Sodium: 137 mmol/L (ref 135–145)
TOTAL PROTEIN: 6.4 g/dL — AB (ref 6.5–8.1)

## 2016-04-10 LAB — URINE CULTURE: Culture: NO GROWTH

## 2016-04-10 LAB — GLUCOSE, CAPILLARY
GLUCOSE-CAPILLARY: 158 mg/dL — AB (ref 65–99)
Glucose-Capillary: 217 mg/dL — ABNORMAL HIGH (ref 65–99)
Glucose-Capillary: 284 mg/dL — ABNORMAL HIGH (ref 65–99)
Glucose-Capillary: 156 mg/dL — ABNORMAL HIGH (ref 65–99)

## 2016-04-10 MED ORDER — JEVITY 1.5 CAL/FIBER PO LIQD
1000.0000 mL | ORAL | Status: AC
  Administered 2016-04-10: 1000 mL
  Filled 2016-04-10: qty 1000

## 2016-04-10 MED ORDER — INSULIN ASPART 100 UNIT/ML ~~LOC~~ SOLN
0.0000 [IU] | SUBCUTANEOUS | Status: DC
  Administered 2016-04-10: 8 [IU] via SUBCUTANEOUS
  Administered 2016-04-10: 3 [IU] via SUBCUTANEOUS
  Administered 2016-04-11: 11 [IU] via SUBCUTANEOUS
  Administered 2016-04-11: 3 [IU] via SUBCUTANEOUS
  Administered 2016-04-11: 5 [IU] via SUBCUTANEOUS

## 2016-04-10 MED ORDER — POTASSIUM CHLORIDE 20 MEQ/15ML (10%) PO SOLN
20.0000 meq | Freq: Every day | ORAL | Status: DC
  Administered 2016-04-10 – 2016-04-11 (×2): 20 meq
  Filled 2016-04-10 (×2): qty 15

## 2016-04-10 MED ORDER — POLYETHYLENE GLYCOL 3350 17 G PO PACK
17.0000 g | PACK | Freq: Every day | ORAL | Status: DC
Start: 2016-04-12 — End: 2016-04-12

## 2016-04-10 MED ORDER — METHOCARBAMOL 500 MG PO TABS
500.0000 mg | ORAL_TABLET | Freq: Four times a day (QID) | ORAL | Status: DC | PRN
  Administered 2016-04-10 – 2016-04-11 (×2): 500 mg
  Filled 2016-04-10 (×2): qty 1

## 2016-04-10 MED ORDER — INSULIN DETEMIR 100 UNIT/ML ~~LOC~~ SOLN
20.0000 [IU] | Freq: Every day | SUBCUTANEOUS | Status: DC
  Administered 2016-04-10 – 2016-04-11 (×2): 20 [IU] via SUBCUTANEOUS
  Filled 2016-04-10 (×2): qty 0.2

## 2016-04-10 MED ORDER — MELATONIN 3 MG PO TABS
6.0000 mg | ORAL_TABLET | Freq: Every day | ORAL | Status: DC
  Administered 2016-04-10 – 2016-04-30 (×21): 6 mg
  Filled 2016-04-10 (×22): qty 2

## 2016-04-10 NOTE — Progress Notes (Signed)
[facility] PHYSICAL MEDICINE & REHABILITATION     PROGRESS NOTE    Subjective/Complaints: Had a reasonable day yesterday. Slept well until about 0300. Woke up and was anxious and confused afterwards. Husband lying in bed with her when I arrived.   ROS limited due to cognition  Objective: Vital Signs: Blood pressure (!) 150/91, pulse (!) 111, temperature 98.4 F (36.9 C), temperature source Oral, resp. rate 17, height 5\' 7"  (1.702 m), weight 74.4 kg (164 lb 0.4 oz), SpO2 97 %. Dg Abd Portable 1v  Result Date: 04/09/2016 CLINICAL DATA:  Nasogastric tube placement. EXAM: PORTABLE ABDOMEN - 1 VIEW COMPARISON:  None. FINDINGS: Normal bowel gas pattern. Small caliber nasogastric tube with its tip in the mid to distal stomach. Lower thoracic spine degenerative changes. IMPRESSION: Nasogastric tube tip in the mid to distal stomach. Electronically Signed   By: Beckie SaltsSteven  Reid M.D.   On: 04/09/2016 11:24    Recent Labs  04/08/16 1909 04/10/16 0832  WBC 5.1 6.4  HGB 12.8 12.7  HCT 36.5 36.9  PLT 189 179    Recent Labs  04/08/16 1039 04/08/16 1909 04/10/16 0832  NA 139  --  137  K 3.5  --  3.3*  CL 104  --  106  GLUCOSE 160*  --  328*  BUN 14  --  18  CREATININE 0.73 0.68 0.62  CALCIUM 9.8  --  9.3   CBG (last 3)   Recent Labs  04/09/16 2011 04/09/16 2328 04/10/16 0348  GLUCAP 141* 157* 217*    Wt Readings from Last 3 Encounters:  04/10/16 74.4 kg (164 lb 0.4 oz)  04/05/16 73.3 kg (161 lb 9.6 oz)  01/15/16 75.2 kg (165 lb 12.8 oz)    Physical Exam:  Constitutional: lying comfortably in bed HENT:  Head: Normocephalic and atraumatic.  Right Ear: External ear normal.  Left Ear: External ear normal.  Mouth/Throat: Oropharynx is clear and moist.  Eyes: Conjunctivae and EOM are normal. Pupils are equal, round, and reactive to light. No scleral icterus.  Neck: No JVD present. No tracheal deviation present. No thyromegaly present.  Cardiovascular: Normal rate, regular  rhythm and intact distal pulses.  Exam reveals no friction rub.   No murmur heard. Respiratory: Effort normal. No respiratory distress. She has no wheezes. She has no rales. She exhibits no tenderness.  GI: She exhibits no distension. There is no tenderness. There is no rebound.  Musculoskeletal: She exhibits no edema. Tenderness: left shoulder still tender with PROM. 1/2" sublux.  Neurological: She is anxious/confused. She displays abnormal reflexes (LUE and LLE 3+). A cranial nerve deficits (left central 7 and tongue deviation.) is present. She exhibits abnormal muscle tone (MAS Left shoulder adductors 2-3/4. biceps 2/4, wrist/finger flexors 2/4. 1/4 knee flexors/plantrar flexors. 4-5 beats of clonus at left ankle--no change). Coordination normal.  RUE and RLE 4 to 4+/5. LUE underlying 1/4 deltoid, biceps, triceps, wrist and finger flexors.  LLE 1+ HF, KE and 0/5 ADF/PF.--no change   Skin: No rash noted. No erythema. No pallor.  Psychiatric: She has a normal mood and affect.  Anxious/distracted  Assessment/Plan: 1. 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 Physiatrist is providing close team supervision and 24 hour management of active medical problems listed below. Physiatrist and rehab team continue to assess barriers to discharge/monitor patient progress toward functional and medical goals.  Function:  Bathing Bathing position  Position: Wheelchair/chair at sink  Bathing parts   Body parts bathed by helper: Right arm, Left arm, Chest, Abdomen, Front perineal area, Buttocks, Right upper leg, Left upper leg, Right lower leg, Left lower leg, Back  Bathing assist Assist Level: 2 helpers      Upper Body Dressing/Undressing Upper body dressing   What is the patient wearing?: Pull over shirt/dress       Pull over shirt/dress - Perfomed  by helper: Thread/unthread right sleeve, Thread/unthread left sleeve, Put head through opening, Pull shirt over trunk        Upper body assist        Lower Body Dressing/Undressing Lower body dressing   What is the patient wearing?: Pants, Non-skid slipper socks       Pants- Performed by helper: Thread/unthread right pants leg, Thread/unthread left pants leg, Pull pants up/down, Fasten/unfasten pants   Non-skid slipper socks- Performed by helper: Don/doff right sock, Don/doff left sock                  Lower body assist Assist for lower body dressing: 2 Helpers      Toileting Toileting Toileting activity did not occur: No continent bowel/bladder event        Toileting assist     Transfers Chair/bed transfer   Chair/bed transfer method: Stand pivot Chair/bed transfer assist level: Maximal assist (Pt 25 - 49%/lift and lower) Chair/bed transfer assistive device: Bedrails, Armrests     Locomotion Ambulation     Max distance: 7310ft  Assist level: 2 helpers   Wheelchair   Type: Manual Max wheelchair distance: 16100ft  Assist Level: Dependent (Pt equals 0%)  Cognition Comprehension Comprehension assist level: Understands basic 25 - 49% of the time/ requires cueing 50 - 75% of the time  Expression Expression assist level: Expresses basic 25 - 49% of the time/requires cueing 50 - 75% of the time. Uses single words/gestures.  Social Interaction Social Interaction assist level: Interacts appropriately less than 25% of the time. May be withdrawn or combative.  Problem Solving Problem solving assist level: Solves basic less than 25% of the time - needs direction nearly all the time or does not effectively solve problems and may need a restraint for safety  Memory Memory assist level: Recognizes or recalls less than 25% of the time/requires cueing greater than 75% of the time   Medical Problem List and Plan: 1.  Left spastic hemiplegia/dysarthria secondary to right pontine  infarct with recent extension             -continue therapies today as tolerated 2.  DVT Prophylaxis/Anticoagulation: Lovenox. Monitor for any bleeding episode 3. Pain Management with recent fall and persistent rib cage pain. Also has had prominent spasms/cramping at night  -Lidoderm patch, Baclofen10 mg 3 times a day, Robaxin as needed.  -resumed lyrica (home medication) at 75mg  qhs (was using 75mg  in am and 150mg  in pm at home)--if more confused at night, may need to consider holding this again 4. Mood: Zoloft 50 mg daily, 5. Neuropsych: This patient is not capable of making decisions on her own behalf.   -HS confusion/altered sleep/wake cycle which has carried over from acute to admission here.  -increase melatonin to 6mg  QHS (home medication)  -keep sleep chart  -need to re-establish sleep wake cycle  -all labs reviewed today  -ua/ucx negative 6. Skin/Wound Care: Routine skin checks 7. Fluids/Electrolytes/Nutrition:              -appreciate RD assistance with  nutrition/TF  -continue TF at [facility] for now   -replace potassium. 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 Hemoglobin A1c 6.7.  Check blood sugars before meals and at bedtime  -sugars low given sparce po intake  -increase levemir back to 20mg  qhs  -Patient also Glucophage 1000 mg BID PTA and resume as needed. 9.Hypertension with bouts of  orthostasis. Presently on Norvasc 5 mg daily. Patient also on lisinopril 40 mg daily as well as Accuretic prior to admission. Resume as needed 10 Hyperlipidemia .Zocor 40 mg daily 12. Spastic left hemiparesis, acute on chronic             -baclofen just resumed on acute                         -titrate to tolerance and effect while on rehab             -consider botox injections             -continue splinting/ROM 13. Poor po intake/altered mentation  -NGT placed  -continue TF at night---encourage PO during the  day  -anxiety/cognitive component most prominently  I spent extensive time reviewing case with husband today. In total 30-40 minutes were spent.    LOS (Days) 2 A FACE TO FACE EVALUATION WAS PERFORMED  Jakeline Dave T 04/10/2016 9:25 AM

## 2016-04-10 NOTE — Progress Notes (Signed)
Occupational Therapy Session Note  Patient Details  Name: Shelly Mcdaniel MRN: [id number] Date of Birth: November 24, 1955  Today's Date: 04/10/2016 OT Individual Time: [id number] OT Individual Time Calculation (min): 55 min     Short Term Goals: Week 1:  OT Short Term Goal 1 (Week 1): Pt will initate and complete UB bathing with min assist and no more than mod instructional cueing.  OT Short Term Goal 2 (Week 1): Pt will perform LB bathing with mod assist sit to stand.   OT Short Term Goal 3 (Week 1): Pt will maintain static sitting balance EOB with supervison in preparation for selfcare tasks and functional transfers.   OT Short Term Goal 4 (Week 1): Pt will complete stand pivot transfer to 3:1 with mod assist 2 consecutive sessions.   OT Short Term Goal 5 (Week 1): Pt will tolerate 70 degrees AAROM at the left shoulder with pain less than 4/10.   Skilled Therapeutic Interventions/Progress Updates:    Pt found in bed covered in loose stool.  Transferred to the wheelchair with max assist for bathing at the sink.  Pt with no awareness of bowel accident.  She needed max demonstrational cueing and max assist for bathing tasks, with hand over hand assist to initiate and continue.  She would respond "yes" when therapist stated wash your arm, but could not complete the task.  Mod assist for sit to stand when therapist provided total assist for washing peri area.  Still voicing pain with LUE movement and occasional moaning even when assisting with the RUE.  Total assist for all dressing tasks this session.  No family present so safety belt in place and pt taken to nurses station for supervision.   Therapy Documentation Precautions:  Precautions Precautions: Fall Precaution Comments: painful left arm with moderate left hemiparesis  Required Braces or Orthoses: Other Brace/Splint Other Brace/Splint: resting hand splint for the left hand at night Restrictions Weight Bearing Restrictions: No  Pain: Pain  Assessment Pain Assessment: No/denies pain ADL: See Function Navigator for Current Functional Status.   Therapy/Group: Individual Therapy  Nickey Kloepfer OTR/L 04/10/2016, 4:14 PM

## 2016-04-11 ENCOUNTER — Inpatient Hospital Stay (HOSPITAL_COMMUNITY): Payer: BC Managed Care – PPO

## 2016-04-11 ENCOUNTER — Inpatient Hospital Stay (HOSPITAL_COMMUNITY): Payer: BC Managed Care – PPO | Admitting: Occupational Therapy

## 2016-04-11 ENCOUNTER — Inpatient Hospital Stay (HOSPITAL_COMMUNITY): Payer: BC Managed Care – PPO | Admitting: Speech Pathology

## 2016-04-11 LAB — GLUCOSE, CAPILLARY
GLUCOSE-CAPILLARY: 191 mg/dL — AB (ref 65–99)
GLUCOSE-CAPILLARY: 226 mg/dL — AB (ref 65–99)
GLUCOSE-CAPILLARY: 231 mg/dL — AB (ref 65–99)
GLUCOSE-CAPILLARY: 325 mg/dL — AB (ref 65–99)
Glucose-Capillary: 164 mg/dL — ABNORMAL HIGH (ref 65–99)
Glucose-Capillary: 192 mg/dL — ABNORMAL HIGH (ref 65–99)
Glucose-Capillary: 329 mg/dL — ABNORMAL HIGH (ref 65–99)

## 2016-04-11 MED ORDER — LIDOCAINE HCL 2 % EX GEL
1.0000 "application " | Freq: Once | CUTANEOUS | Status: DC
  Filled 2016-04-11: qty 5

## 2016-04-11 MED ORDER — ACETAMINOPHEN 325 MG PO TABS
650.0000 mg | ORAL_TABLET | Freq: Four times a day (QID) | ORAL | Status: DC | PRN
  Administered 2016-04-11 – 2016-05-01 (×35): 650 mg via ORAL
  Filled 2016-04-11 (×36): qty 2

## 2016-04-11 MED ORDER — BACLOFEN 5 MG HALF TABLET
15.0000 mg | ORAL_TABLET | Freq: Three times a day (TID) | ORAL | Status: DC
  Administered 2016-04-11 (×2): 15 mg via ORAL
  Administered 2016-04-12: 10 mg via ORAL
  Administered 2016-04-12 (×2): 15 mg via ORAL
  Administered 2016-04-12: 5 mg via ORAL
  Administered 2016-04-13 – 2016-05-01 (×53): 15 mg via ORAL
  Filled 2016-04-11 (×59): qty 1

## 2016-04-11 MED ORDER — INSULIN ASPART 100 UNIT/ML ~~LOC~~ SOLN
0.0000 [IU] | Freq: Three times a day (TID) | SUBCUTANEOUS | Status: DC
  Administered 2016-04-11 (×2): 3 [IU] via SUBCUTANEOUS
  Administered 2016-04-11: 5 [IU] via SUBCUTANEOUS
  Administered 2016-04-12: 2 [IU] via SUBCUTANEOUS
  Administered 2016-04-12: 3 [IU] via SUBCUTANEOUS
  Administered 2016-04-12: 11 [IU] via SUBCUTANEOUS
  Administered 2016-04-12: 2 [IU] via SUBCUTANEOUS
  Administered 2016-04-13: 11 [IU] via SUBCUTANEOUS
  Administered 2016-04-13: 2 [IU] via SUBCUTANEOUS
  Administered 2016-04-13 – 2016-04-14 (×3): 5 [IU] via SUBCUTANEOUS
  Administered 2016-04-14: 8 [IU] via SUBCUTANEOUS
  Administered 2016-04-14: 5 [IU] via SUBCUTANEOUS
  Administered 2016-04-15: 2 [IU] via SUBCUTANEOUS
  Administered 2016-04-15: 8 [IU] via SUBCUTANEOUS
  Administered 2016-04-15 (×2): 3 [IU] via SUBCUTANEOUS
  Administered 2016-04-16: 2 [IU] via SUBCUTANEOUS
  Administered 2016-04-16: 5 [IU] via SUBCUTANEOUS
  Administered 2016-04-16: 15 [IU] via SUBCUTANEOUS
  Administered 2016-04-16: 5 [IU] via SUBCUTANEOUS
  Administered 2016-04-17: 11 [IU] via SUBCUTANEOUS
  Administered 2016-04-17: 3 [IU] via SUBCUTANEOUS
  Administered 2016-04-17: 5 [IU] via SUBCUTANEOUS
  Administered 2016-04-17 – 2016-04-18 (×2): 3 [IU] via SUBCUTANEOUS
  Administered 2016-04-18: 8 [IU] via SUBCUTANEOUS
  Administered 2016-04-18: 3 [IU] via SUBCUTANEOUS
  Administered 2016-04-18: 5 [IU] via SUBCUTANEOUS
  Administered 2016-04-19: 3 [IU] via SUBCUTANEOUS
  Administered 2016-04-19: 15 [IU] via SUBCUTANEOUS
  Administered 2016-04-20 (×2): 3 [IU] via SUBCUTANEOUS
  Administered 2016-04-20: 11 [IU] via SUBCUTANEOUS
  Administered 2016-04-20: 3 [IU] via SUBCUTANEOUS
  Administered 2016-04-21: 8 [IU] via SUBCUTANEOUS
  Administered 2016-04-21: 2 [IU] via SUBCUTANEOUS
  Administered 2016-04-21: 8 [IU] via SUBCUTANEOUS
  Administered 2016-04-22: 3 [IU] via SUBCUTANEOUS
  Administered 2016-04-22: 2 [IU] via SUBCUTANEOUS
  Administered 2016-04-22: 3 [IU] via SUBCUTANEOUS
  Administered 2016-04-23: 8 [IU] via SUBCUTANEOUS
  Administered 2016-04-23: 2 [IU] via SUBCUTANEOUS
  Administered 2016-04-23 – 2016-04-24 (×2): 3 [IU] via SUBCUTANEOUS
  Administered 2016-04-24: 5 [IU] via SUBCUTANEOUS
  Administered 2016-04-24: 8 [IU] via SUBCUTANEOUS
  Administered 2016-04-25: 3 [IU] via SUBCUTANEOUS
  Administered 2016-04-25: 2 [IU] via SUBCUTANEOUS
  Administered 2016-04-25: 3 [IU] via SUBCUTANEOUS
  Administered 2016-04-25: 5 [IU] via SUBCUTANEOUS
  Administered 2016-04-26: 2 [IU] via SUBCUTANEOUS
  Administered 2016-04-26 (×2): 3 [IU] via SUBCUTANEOUS
  Administered 2016-04-26: 2 [IU] via SUBCUTANEOUS
  Administered 2016-04-27: 8 [IU] via SUBCUTANEOUS
  Administered 2016-04-27: 3 [IU] via SUBCUTANEOUS
  Administered 2016-04-27 (×2): 2 [IU] via SUBCUTANEOUS
  Administered 2016-04-28 (×2): 3 [IU] via SUBCUTANEOUS
  Administered 2016-04-28: 5 [IU] via SUBCUTANEOUS
  Administered 2016-04-29: 2 [IU] via SUBCUTANEOUS
  Administered 2016-04-29: 3 [IU] via SUBCUTANEOUS
  Administered 2016-04-29 – 2016-05-01 (×4): 2 [IU] via SUBCUTANEOUS

## 2016-04-11 MED ORDER — GLUCERNA SHAKE PO LIQD: 237.0000 mL | Freq: Three times a day (TID) | ORAL | Status: DC

## 2016-04-11 MED ORDER — FREE WATER
100.0000 mL | Freq: Three times a day (TID) | Status: DC
  Administered 2016-04-11 – 2016-04-27 (×57): 100 mL

## 2016-04-11 MED ORDER — GLUCERNA SHAKE PO LIQD
237.0000 mL | Freq: Three times a day (TID) | ORAL | Status: DC
  Administered 2016-04-12 – 2016-04-29 (×26): 237 mL via ORAL

## 2016-04-11 MED ORDER — JEVITY 1.5 CAL/FIBER PO LIQD
1000.0000 mL | ORAL | Status: DC
  Administered 2016-04-11 – 2016-04-24 (×14): 1000 mL
  Filled 2016-04-11 (×16): qty 1000

## 2016-04-11 MED ORDER — METFORMIN HCL 500 MG PO TABS
500.0000 mg | ORAL_TABLET | Freq: Every day | ORAL | Status: DC
  Administered 2016-04-11 – 2016-04-13 (×3): 500 mg via ORAL
  Filled 2016-04-11 (×3): qty 1

## 2016-04-11 MED ORDER — PREGABALIN 50 MG PO CAPS
50.0000 mg | ORAL_CAPSULE | Freq: Two times a day (BID) | ORAL | Status: DC
  Administered 2016-04-11 – 2016-04-18 (×15): 50 mg via ORAL
  Filled 2016-04-11 (×17): qty 1

## 2016-04-11 NOTE — IPOC Note (Addendum)
Overall Plan of Care [facility](IPOC) Patient Details Name: Shelly SimmerJody D Mcdaniel MRN: [id number] DOB: 01/12/1956  Admitting Diagnosis: CVA  Hospital Problems: Principal Problem:   Right pontine cerebrovascular accident [facility](HCC) Active Problems:   Uncontrolled hypertension   DM II (diabetes mellitus, type II), controlled (HCC)   Spastic hemiparesis of left nondominant side (HCC)     Functional Problem List: Nursing Behavior, Bladder, Bowel, Endurance, Medication Management, Motor, Nutrition, Pain, Perception, Safety, Sensory, Skin Integrity  PT Balance, Behavior, Endurance, Motor, Pain, Perception, Safety, Sensory  OT Balance, Cognition, Endurance, Motor, Pain, Perception, Safety  SLP Cognition  TR         Basic ADL's: OT Grooming, Bathing, Dressing, Toileting, Eating     Advanced  ADL's: OT       Transfers: PT Bed Mobility, Bed to Chair, Car, Lobbyisturniture  OT Tub/Shower, Technical brewerToilet     Locomotion: PT Ambulation, Psychologist, prison and probation servicesWheelchair Mobility, Stairs     Additional Impairments: OT Fuctional Use of Upper Extremity  SLP Social Cognition   Attention, Awareness, Memory, Problem Solving  TR      Anticipated Outcomes Item Anticipated Outcome  Self Feeding supervision  Swallowing      Basic self-care  min assist  Toileting  min assist   Bathroom Transfers min assist  Bowel/Bladder  continent of bowel and bladder.  Transfers  Min Assist with H. J. HeinzLRAD   Locomotion  Min Assist with LRAD for household distances.   Communication     Cognition  min assist   Pain  pain less than or equal to 2  Safety/Judgment  min assist for transfer.   Therapy Plan: PT Intensity: Minimum of 1-2 x/day ,45 to 90 minutes PT Frequency: 5 out of 7 days PT Duration Estimated Length of Stay: 17-19 days  OT Intensity: Minimum of 1-2 x/day, 45 to 90 minutes OT Frequency: 5 out of 7 days OT Duration/Estimated Length of Stay: 17-19 days SLP Intensity: Minumum of 1-2 x/day, 30 to 90 minutes SLP Frequency: 3 to 5 out of 7  days SLP Duration/Estimated Length of Stay: 14-21 days        Team Interventions: Nursing Interventions Patient/Family Education, Bladder Management, Bowel Management, Disease Management/Prevention, Pain Management, Medication Management, Skin Care/Wound Management, Cognitive Remediation/Compensation, Discharge Planning, Psychosocial Support  PT interventions Ambulation/gait training, Warden/rangerBalance/vestibular training, Community reintegration, Discharge planning, Disease management/prevention, DME/adaptive equipment instruction, Functional electrical stimulation, Functional mobility training, Neuromuscular re-education, Pain management, Patient/family education, Psychosocial support, Skin care/wound management, Splinting/orthotics, Stair training, Therapeutic Activities, Therapeutic Exercise, UE/LE Coordination activities, Visual/perceptual remediation/compensation, Wheelchair propulsion/positioning, UE/LE Strength taining/ROM  OT Interventions Warden/rangerBalance/vestibular training, Cognitive remediation/compensation, FirefighterCommunity reintegration, Fish farm managerDME/adaptive equipment instruction, Disease mangement/prevention, Discharge planning, Functional electrical stimulation, Functional mobility training, Neuromuscular re-education, Psychosocial support, Patient/family education, Pain management, Self Care/advanced ADL retraining, Splinting/orthotics, UE/LE Strength taining/ROM, Therapeutic Exercise, Therapeutic Activities, UE/LE Coordination activities, Wheelchair propulsion/positioning  SLP Interventions Cognitive remediation/compensation, Financial traderCueing hierarchy, Functional tasks, Environmental controls, Internal/external aids, Patient/family education  TR Interventions    SW/CM Interventions  Psyschosocial Assessment, Pt/Family Education/ Discharge Planning    Team Discharge Planning: Destination: PT-Home ,OT- Home , SLP-Home Projected Follow-up: PT-Home health PT, OT-  Outpatient OT, SLP-Home Health SLP, Outpatient SLP, 24 hour  supervision/assistance Projected Equipment Needs: PT-To be determined, OT- None recommended by OT, SLP-None recommended by SLP Equipment Details: PT- , OT-  Patient/family involved in discharge planning: PT- Patient, Family member/caregiver,  OT-Patient, SLP-Patient, Family member/caregiver  MD ELOS: 20-25d Medical Rehab Prognosis:  Good Assessment: HPI: Shelly D Henleyis a 60 y.o.right handed femalewith history of hypertension, diabetes  mellitus, history of falls, [place] [facility] maintained on aspirin.Per chart review patient lives with spouse. Used a quad cane prior to admission. She had recently been receiving outpatient therapies.Presented 04/05/2016 with altered mental status and dysarthria. Patient had a fall in March with resultant persistent left rib cage pain and medications adjusted for pain started on baclofen in September, Lyrica in August had been on Neurontin without any significant dosage changes. Urine drug screen negative. In the ED patient tachycardic 110-1:30 systolic pressure in the 170s. CT scan negative for acute changes. MRI showed foci of diffusion restriction within the right hemi-pons and abnormal signal in the middle cerebellar peduncle most likely representing acute early subacute ischemia. No hemorrhage. MRA with no large vessel occlusion or aneurysm. Patient did not receive TPA    Now requiring 24/7 Rehab RN,MD, as well as CIR level PT, OT and SLP.  Treatment team will focus on ADLs and mobility [facility] See Team Conference Notes for weekly updates to the plan of care

## 2016-04-11 NOTE — Progress Notes (Signed)
Erick ColaceAndrew E Kirsteins, MD Physician Signed Physical Medicine and Rehabilitation  Consult Note Date of Service: 04/07/2016 12:41 PM  Related encounter: ED to Hosp-Admission (Discharged) from 04/05/2016 [facility]     Expand All Collapse All   [] Hide copied text [] Hover for attribution information      Physical Medicine and Rehabilitation Consult Reason for Consult: Right pons and bilateral cerebellar peduncle infarcts Referring Physician: [facility]   HPI: Shelly Mcdaniel is a 60 y.o. 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 Per chart review patient lives with spouse. Used a quad cane prior to admission. She had recently been receiving outpatient therapies. Presented 04/05/2016 with altered mental status and dysarthria. Patient had a fall in March with resultant persistent left rib cage pain and medications adjusted for pain started on baclofen in September, Lyrica in August had been on Neurontin without any significant dosage changes. Urine drug screen negative. In the ED patient tachycardic 110-1:30 systolic pressure in the 170s. CT scan negative for acute changes. MRI showed foci of diffusion restriction within the right hemi-pons and abnormal signal in the middle cerebellar peduncle most likely representing acute early subacute ischemia. No hemorrhage. MRA with no large vessel occlusion or aneurysm. Patient did not receive TPA. EEG with generalized slowing pattern nonspecific consistent with global encephalopathic process. No seizure activity noted. Neurology consulted presently on aspirin and Plavix for CVA prophylaxis. Tolerating a regular consistency diet. Formal physical and occupational therapy evaluations pending. M.D. has requested physical medicine  rehabilitation consult.  According to son was using wheelchair as well as occasional walker at home since August 2017, CVA. Patient has been attending [facility] Speech has declined since most recent stroke. 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 Review of Systems  Constitutional: Negative for chills and fever.  HENT: Negative for hearing loss.   Eyes: Negative for blurred vision and double vision.  Respiratory: Negative for cough and shortness of breath.   Cardiovascular: Negative for chest pain, palpitations and leg swelling.  Gastrointestinal: Positive for constipation. Negative for nausea and vomiting.  Genitourinary: Negative for dysuria and hematuria.  Musculoskeletal: Positive for back pain, falls, joint pain and myalgias.  Skin: Negative for rash.  Neurological: Positive for speech change and weakness. Negative for seizures, loss of consciousness and headaches.  Psychiatric/Behavioral: Positive for depression.  All other systems reviewed and are negative.      Past Medical History:  Diagnosis Date  . Diabetes mellitus without complication (HCC)   . Hyperlipidemia   . Hypertension         Past Surgical History:  Procedure Laterality Date  . BACK SURGERY    . BREAST SURGERY    . COLONOSCOPY WITH PROPOFOL N/A 12/24/2013   Procedure: COLONOSCOPY WITH PROPOFOL;  Surgeon: Charolett BumpersMartin K Johnson, MD;  Location: [facility];  Service: Endoscopy;  Laterality: N/A;        Family History  Problem Relation Age of Onset  . Stroke Mother   . Dementia Mother   . Diabetes Father   . Stroke Father   . CAD Father   . Diabetes Sister   . Diabetes Brother   . CAD Brother   . Stroke Brother   . Kidney failure Brother   . Hypertension  Other   . Hyperlipidemia Other   . Stroke Other   . Heart disease Other   . Diabetes Other    Social History:  reports that she has never  smoked. She has never used smokeless tobacco. She reports that she does not drink alcohol or use drugs. Allergies:       Allergies  Allergen Reactions  . Bee Venom Anaphylaxis  . Codeine Itching  . Lipitor [Atorvastatin] Other (See Comments)    Cramping  . Septra [Sulfamethoxazole-Trimethoprim]     Mother died from Stevens-Johnson syndrome and sister had anaphylaxis from Septra.Physician told her never to take this medication.         Medications Prior to Admission  Medication Sig Dispense Refill  . aspirin EC 81 MG tablet Take 81 mg by mouth daily.    . baclofen (LIORESAL) 10 MG tablet Take 10 mg by mouth 2 (two) times daily.     Marland Kitchen docusate sodium (COLACE) 100 MG capsule Take 100 mg by mouth daily.    . insulin aspart (NOVOLOG FLEXPEN) 100 UNIT/ML FlexPen Inject 5 Units into the skin 3 (three) times daily with meals. (Patient taking differently: Inject 4 Units into the skin 2 (two) times daily before lunch and supper. ) 15 mL 11  . LEVEMIR FLEXTOUCH 100 UNIT/ML Pen Inject 25 Units into the skin every evening.     . lidocaine (LIDODERM) 5 % Place 1 patch onto the skin daily. Remove & Discard patch within 12 hours or as directed by MD    . lisinopril (PRINIVIL,ZESTRIL) 40 MG tablet Take 40 mg by mouth daily.    . Melatonin 3 MG TABS Take 3 mg by mouth at bedtime.    . metFORMIN (GLUCOPHAGE-XR) 500 MG 24 hr tablet Take 1,000 mg by mouth 2 (two) times daily.     . ondansetron (ZOFRAN) 4 MG tablet Take 1 tablet (4 mg total) by mouth every 8 (eight) hours as needed for nausea or vomiting. 20 tablet 0  . polyethylene glycol (MIRALAX / GLYCOLAX) packet Take 17 g by mouth daily as needed for moderate constipation.    . pregabalin (LYRICA) 75 MG capsule Take 75 mg by mouth 2 (two) times daily.    Marland Kitchen scopolamine (TRANSDERM-SCOP) 1 MG/3DAYS Place 1 patch onto the skin every 3 (three) days.    Marland Kitchen senna (SENOKOT) 8.6 MG tablet Take 1 tablet by mouth daily.    . sertraline  (ZOLOFT) 50 MG tablet Take 50 mg by mouth at bedtime.     . simvastatin (ZOCOR) 40 MG tablet Take 40 mg by mouth at bedtime.    . traMADol (ULTRAM) 50 MG tablet Take 1 tablet (50 mg total) by mouth every 6 (six) hours as needed. 5 tablet 0  . cephALEXin (KEFLEX) 500 MG capsule Take 1 capsule (500 mg total) by mouth 2 (two) times daily. (Patient not taking: Reported on 04/05/2016) 40 capsule 0  . gabapentin (NEURONTIN) 100 MG capsule Titrate dose as directed (Patient not taking: Reported on 04/05/2016) 90 capsule 1  . gabapentin (NEURONTIN) 300 MG capsule Titrate dose as directed (Patient not taking: Reported on 04/05/2016) 20 capsule 0  . quinapril-hydrochlorothiazide (ACCURETIC) 20-25 MG per tablet Take 1 tablet by mouth at bedtime.      Home: Home Living Living Arrangements: Spouse/significant other  Functional History:   Functional Status:  Mobility:          ADL:    Cognition: Cognition Orientation Level: Oriented to person, Oriented to place, Disoriented  to time, Disoriented to situation    Blood pressure (!) 173/100, pulse (!) 102, temperature 98.2 F (36.8 C), temperature source Oral, resp. rate 16, height 5\' 8"  (1.727 m), weight 73.3 kg (161 lb 9.6 oz), SpO2 100 %. Physical Exam  Vitals reviewed. HENT:  Mild left facial droop  Eyes:  Pupils round and reactive to light  Neck: Normal range of motion. Neck supple. No thyromegaly present.  Cardiovascular: Normal rate and regular rhythm.   Respiratory: Effort normal and breath sounds normal. No respiratory distress.  GI: Soft. Bowel sounds are normal. She exhibits no distension.  Neurological: She is alert.  Speech is dysarthric but intelligible. Followed simple commands. Appears to have some delay in processing.  Skin: Skin is warm and dry.  Sparse verbal output, says goodbye and hello. Follows simple commands after delay. Motor strength is 4/5 in the right deltoid, biceps, triceps, grip, 4/5 in the right  hip flexor, knee extensor, ankle dorsiflexor. Trace left elbow flexion. 2 minus, left hip, knee extensor synergy Sensation reported as intact to light touch in both upper and lower limbs. Patient has incomplete left eye abduction on left lateral gaze. There is evidence of facial droop, left central 7 No evidence of nystagmus Modified Ashworth score 3 at the left elbow flexors, finger flexors and wrist flexors Left shoulder pain with external rotation as well as with abduction. Increased tone at the left shoulder adductor's Lab Results Last 24 Hours       Results for orders placed or performed during the hospital encounter of 04/05/16 (from the past 24 hour(s))  Glucose, capillary     Status: Abnormal   Collection Time: 04/06/16  4:23 PM  Result Value Ref Range   Glucose-Capillary 102 (H) 65 - 99 mg/dL  Glucose, capillary     Status: Abnormal   Collection Time: 04/06/16  7:36 PM  Result Value Ref Range   Glucose-Capillary 165 (H) 65 - 99 mg/dL   Comment 1 Notify RN    Comment 2 Document in Chart   Glucose, capillary     Status: Abnormal   Collection Time: 04/06/16 11:37 PM  Result Value Ref Range   Glucose-Capillary 111 (H) 65 - 99 mg/dL   Comment 1 Notify RN    Comment 2 Document in Chart   Lipid panel     Status: Abnormal   Collection Time: 04/07/16  3:17 AM  Result Value Ref Range   Cholesterol 164 0 - 200 mg/dL   Triglycerides 119 <147 mg/dL   HDL 29 (L) >82 mg/dL   Total CHOL/HDL Ratio 5.7 RATIO   VLDL 25 0 - 40 mg/dL   LDL Cholesterol 956 (H) 0 - 99 mg/dL  Vitamin O13     Status: None   Collection Time: 04/07/16  3:17 AM  Result Value Ref Range   Vitamin B-12 320 180 - 914 pg/mL  Folate     Status: None   Collection Time: 04/07/16  3:17 AM  Result Value Ref Range   Folate 21.7 >5.9 ng/mL  Glucose, capillary     Status: Abnormal   Collection Time: 04/07/16  3:40 AM  Result Value Ref Range   Glucose-Capillary 125 (H) 65 - 99 mg/dL    Comment 1 Notify RN    Comment 2 Document in Chart   Glucose, capillary     Status: Abnormal   Collection Time: 04/07/16  7:58 AM  Result Value Ref Range   Glucose-Capillary 130 (H) 65 - 99 mg/dL  Glucose,  capillary     Status: Abnormal   Collection Time: 04/07/16 11:28 AM  Result Value Ref Range   Glucose-Capillary 168 (H) 65 - 99 mg/dL      Imaging Results (Last 48 hours)  Dg Chest 2 View  Result Date: 04/05/2016 CLINICAL DATA:  60 year old female with altered mental status for the past several days. EXAM: CHEST  2 VIEW COMPARISON:  Chest x-ray 08/20/2015. FINDINGS: Lung volumes are normal. No consolidative airspace disease. No pleural effusions. No pneumothorax. No pulmonary nodule or mass noted. Pulmonary vasculature and the cardiomediastinal silhouette are within normal limits. Orthopedic fixation hardware in the lower cervical spine incidentally noted. IMPRESSION: No radiographic evidence of acute cardiopulmonary disease. Electronically Signed   By: Trudie Reed M.D.   On: 04/05/2016 17:14   Mr Shirlee Latch ZO Contrast  Result Date: 04/05/2016 CLINICAL DATA:  60 y/o F; history of stroke with residual left-sided facial weakness, arm weakness, and leg weakness presenting with worsening confusion. EXAM: MRI HEAD WITHOUT CONTRAST MRA HEAD WITHOUT CONTRAST TECHNIQUE: Multiplanar, multiecho pulse sequences of the brain and surrounding structures were obtained without intravenous contrast. Angiographic images of the head were obtained using MRA technique without contrast. COMPARISON:  04/05/2016 CT head.  11/08/2014 MRI of the brain. FINDINGS: MRI HEAD FINDINGS Brain: There are foci of diffusion restriction within the right hemipons and within the bilateral middle cerebral peduncles. Areas of diffusion restriction are associated mild T2 FLAIR hyperintensity. There is a background of nonspecific foci of T2 FLAIR hyperintensity in subcortical and periventricular white matter with  frontal parietal predominance compatible with moderate chronic microvascular ischemic changes. There is a chronic lacunar infarct within the left lentiform nucleus extending into the corona radiata. No abnormal susceptibility hypointensity to indicate intracranial hemorrhage. Ventricle size is normal. No extra-axial collection. Vascular: See below. Skull and upper cervical spine: Normal marrow signal. Sinuses/Orbits: There is mucosal thickening within the anterior ethmoid air cells and the right frontal sinus. There is opacification of the right mastoid tip. Orbits are unremarkable. Other: None. MRA HEAD FINDINGS Anterior circulation: Irregularity of the cavernous and paraclinoid segments of internal carotid arteries bilaterally with mild stenosis. Bilateral MCA and ACA are patent without high-grade stenosis, aneurysm, or occlusion. Posterior circulation: Unchanged diminutive right vertebral artery and left dominant vertebrobasilar system. Irregularity of the basilar artery with moderate to severe short segment of distal stenosis that is worsened in comparison with the prior MRI. Bilateral posterior cerebral arteries are patent without high-grade stenosis, aneurysm, or occlusion. Anatomic variant: Left fetal posterior cerebral artery with diminutive P1 segment. No right posterior communicating artery identified, likely hypoplastic or absent. Probable diminutive anterior communicating artery IMPRESSION: 1. Foci of diffusion restriction within the right hemipons and abnormal signal in the middle cerebellar peduncles probably represent areas of acute/early subacute ischemia. No hemorrhage identified. 2. 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 3. Otherwise the circle of Willis is patent with atherosclerotic changes and short segments of mild stenosis in the anterior and posterior circulation without large vessel occlusion or aneurysm. 4. Paranasal sinus disease predominantly in anterior  ethmoid and right frontal sinuses. 5. Stable moderate chronic microvascular ischemic changes of the brain parenchyma. These results were called by telephone at the time of interpretation on 04/05/2016 at 6:25 pm to Dr. Erma Heritage, who verbally acknowledged these results. Electronically Signed   By: Mitzi Hansen M.D.   On: 04/05/2016 18:28   Mr Brain Wo Contrast  Result Date: 04/05/2016 CLINICAL DATA:  60 y/o F; history of stroke  with residual left-sided facial weakness, arm weakness, and leg weakness presenting with worsening confusion. EXAM: MRI HEAD WITHOUT CONTRAST MRA HEAD WITHOUT CONTRAST TECHNIQUE: Multiplanar, multiecho pulse sequences of the brain and surrounding structures were obtained without intravenous contrast. Angiographic images of the head were obtained using MRA technique without contrast. COMPARISON:  04/05/2016 CT head.  11/08/2014 MRI of the brain. FINDINGS: MRI HEAD FINDINGS Brain: There are foci of diffusion restriction within the right hemipons and within the bilateral middle cerebral peduncles. Areas of diffusion restriction are associated mild T2 FLAIR hyperintensity. There is a background of nonspecific foci of T2 FLAIR hyperintensity in subcortical and periventricular white matter with frontal parietal predominance compatible with moderate chronic microvascular ischemic changes. There is a chronic lacunar infarct within the left lentiform nucleus extending into the corona radiata. No abnormal susceptibility hypointensity to indicate intracranial hemorrhage. Ventricle size is normal. No extra-axial collection. Vascular: See below. Skull and upper cervical spine: Normal marrow signal. Sinuses/Orbits: There is mucosal thickening within the anterior ethmoid air cells and the right frontal sinus. There is opacification of the right mastoid tip. Orbits are unremarkable. Other: None. MRA HEAD FINDINGS Anterior circulation: Irregularity of the cavernous and paraclinoid segments of  internal carotid arteries bilaterally with mild stenosis. Bilateral MCA and ACA are patent without high-grade stenosis, aneurysm, or occlusion. Posterior circulation: Unchanged diminutive right vertebral artery and left dominant vertebrobasilar system. Irregularity of the basilar artery with moderate to severe short segment of distal stenosis that is worsened in comparison with the prior MRI. Bilateral posterior cerebral arteries are patent without high-grade stenosis, aneurysm, or occlusion. Anatomic variant: Left fetal posterior cerebral artery with diminutive P1 segment. No right posterior communicating artery identified, likely hypoplastic or absent. Probable diminutive anterior communicating artery IMPRESSION: 1. Foci of diffusion restriction within the right hemipons and abnormal signal in the middle cerebellar peduncles probably represent areas of acute/early subacute ischemia. No hemorrhage identified. 2. 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 3. Otherwise the circle of Willis is patent with atherosclerotic changes and short segments of mild stenosis in the anterior and posterior circulation without large vessel occlusion or aneurysm. 4. Paranasal sinus disease predominantly in anterior ethmoid and right frontal sinuses. 5. Stable moderate chronic microvascular ischemic changes of the brain parenchyma. These results were called by telephone at the time of interpretation on 04/05/2016 at 6:25 pm to Dr. Erma HeritageIsaacs, who verbally acknowledged these results. Electronically Signed   By: Mitzi HansenLance  Furusawa-Stratton M.D.   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 Does the need for close, 24 hr/day medical supervision in concert with the patient's rehab needs make it unreasonable for this patient to be served in a less intensive setting? Yes 2. Co-Morbidities requiring supervision/potential  complications: Diabetes, hypertension 3. Due to bladder management, bowel management, safety, skin/wound care, disease management, medication administration, pain management and patient education, does the patient require 24 hr/day rehab nursing? Yes 4. Does the patient require coordinated care of a physician, rehab nurse, PT (1-2 hrs/day, 5 days/week), OT (1-2 hrs/day, 5 days/week) and SLP (.5-1 hrs/day, 5 days/week) to address physical and functional deficits in the context of the above medical diagnosis(es)? Yes Addressing deficits in the following areas: balance, endurance, locomotion, strength, transferring, bowel/bladder control, bathing, dressing, feeding, grooming, toileting, cognition and psychosocial support 5. Can the patient actively participate in an intensive therapy program of at least 3 hrs of therapy per day at least 5  days per week? Yes 6. The potential for patient to make measurable gains while on inpatient rehab is good 7. Anticipated functional outcomes upon discharge from inpatient rehab are min assist  with PT, min assist with OT, min assist with SLP. 8. Estimated rehab length of stay to reach the above functional goals is: 18-22 days 9. Does the patient have adequate social supports and living environment to accommodate these discharge functional goals? Yes 10. Anticipated D/C setting: Home 11. Anticipated post D/C treatments: Outpatient therapy 12. Overall Rehab/Functional Prognosis: good  RECOMMENDATIONS: This patient's condition is appropriate for continued rehabilitative care in the following setting: CIR Patient has agreed to participate in recommended program. Potentially Note that insurance prior authorization may be required for reimbursement for recommended care.  Comment: May need Botox injection while in hospital for left elbow, wrist and finger flexor spasticity    04/07/2016    Revision History                   Routing History

## 2016-04-11 NOTE — Progress Notes (Signed)
Patient information reviewed and entered into eRehab system by Casidy Alberta, RN, CRRN, PPS Coordinator.  Information including medical coding and functional independence measure will be reviewed and updated through discharge.    

## 2016-04-11 NOTE — Evaluation (Signed)
Speech Language Pathology Bedside Swallowing Assessment and Plan  Patient Details  Name: Shelly Mcdaniel MRN: [id number] Date of Birth: 01-14-1956  SLP Diagnosis: Cognitive Impairments;Dysphagia  Rehab Potential: Good ELOS: 14-21 days     Today's Date: 04/11/2016 SLP Individual Time: 1500-1530 SLP Individual Time Calculation (min): 30 min    Problem List:  Patient Active Problem List   Diagnosis Date Noted  . Orthostatic hypotension 04/08/2016  . Muscle cramps 04/08/2016  . 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 Cerebral thrombosis with cerebral infarction 04/06/2016  . Basilar artery stenosis   . Dysarthria 04/05/2016  . History of CVA with residual deficit 04/05/2016  . Sinus tachycardia 04/05/2016  . Acute encephalopathy 04/05/2016  . Chronic pain 04/05/2016  . Hyperlipidemia 04/05/2016  . Ischemic stroke (HCC)   . 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 TIA (transient ischemic attack) 11/07/2014  . Uncontrolled hypertension 11/07/2014  . DM II (diabetes mellitus, type II), controlled (HCC) 11/07/2014   Past Medical History:  Past Medical History:  Diagnosis Date  . Diabetes mellitus without complication (HCC)   . Hyperlipidemia   . Hypertension    Past Surgical History:  Past Surgical History:  Procedure Laterality Date  . BACK SURGERY    . BREAST SURGERY    . COLONOSCOPY WITH PROPOFOL N/A 12/24/2013   Procedure: COLONOSCOPY WITH PROPOFOL;  Surgeon: Charolett Bumpers, MD;  Location: [facility];  Service: Endoscopy;  Laterality: N/A;    Assessment / Plan / Recommendation Clinical Impression   Shelly D Henleyis a 60 y.o.right handed femalewith history of hypertension, diabetes mellitus, history of falls, CV[place] and received therapies at [facility] residual left-sided weakness maintained on aspirin. She had recently been receiving outpatient  therapies.Presented 04/05/2016 with altered mental status and dysarthria. Patient had a fall in March with resultant persistent left rib cage pain and medications adjusted for pain started on baclofen in September, Lyrica in August had been on Neurontin without any significant dosage changes. Urine drug screen negative. MRI showed foci of diffusion restriction within the right hemi-pons and abnormal signal in the middle cerebellar peduncle most likely representing acute early subacute ischemia. No hemorrhage.  EEG with generalized slowing pattern nonspecific consistent with global encephalopathic process. No seizure activity noted. Tolerating a regular consistency diet. Patientwas admitted for a comprehensive rehabilitation program on10/27/2017.  Attempted BSE on 04/09/2016 but pt was unable to tolerate due to mentation.  Pt much brighter and participatory today and BSE was administered with results as follows:  Pt presents with a mild oral dysphagia with primarily cognitive components.  Pt with decreased attention to boluses in the presence of increased environmental distractions which in combination with baseline oral motor weakness from previous CVA results in mild diffuse oral residue and prolonged oral transit of boluses which could impact swallowing safety with solids.  No overt s/s of aspiration were evident with solids or liquids.  Recommend that pt remain on regular diet with thin liquids and full supervision for use of swallowing precautions.    Skilled Therapeutic Interventions          Bedside evaluation completed with results and recommendations reviewed with patient and family.  Pt needed min assist verbal cues to clear boluses from the oral cavity due to distractibility to the environment which impacted her attention to boluses.  Husband was present during therapy session and educated regarding recommendations for full supervision due to cognition and is  now signed off to supervise pt during  meals.  Pt left in bed with husband at bedside.  Continue per current plan of care.       SLP Assessment  Patient will need skilled Speech Lanaguage Pathology Services during CIR admission    Recommendations  SLP Diet Recommendations: Age appropriate regular solids;Thin Liquid Administration via: Cup Medication Administration: Whole meds with liquid Supervision: Patient able to self feed;Full supervision/cueing for compensatory strategies Compensations: Slow rate;Small sips/bites;Lingual sweep for clearance of pocketing;Minimize environmental distractions Postural Changes and/or Swallow Maneuvers: Out of bed for meals Oral Care Recommendations: Oral care BID Recommendations for Other Services: Neuropsych consult Patient destination: Home Follow up Recommendations: None Equipment Recommended: None recommended by SLP    SLP Frequency 3 to 5 out of 7 days   SLP Duration  SLP Intensity  SLP Treatment/Interventions 14-21 days   Minumum of 1-2 x/day, 30 to 90 minutes  Cognitive remediation/compensation;Cueing hierarchy;Functional tasks;Environmental controls;Internal/external aids;Patient/family education    Pain Pain Assessment Pain Assessment: No/denies pain  Prior Functioning  See cognitive evaluation, no changes   Function:  Eating Eating   Modified Consistency Diet: No Eating Assist Level: Supervision or verbal cues;Set up assist for   Eating Set Up Assist For: Opening containers       Cognition Comprehension Comprehension assist level: Understands basic 75 - 89% of the time/ requires cueing 10 - 24% of the time  Expression   Expression assist level: Expresses basic 50 - 74% of the time/requires cueing 25 - 49% of the time. Needs to repeat parts of sentences.  Social Interaction Social Interaction assist level: Interacts appropriately 50 - 74% of the time - May be physically or verbally inappropriate.  Problem Solving Problem solving assist level: Solves basic 25 -  49% of the time - needs direction more than half the time to initiate, plan or complete simple activities  Memory Memory assist level: Recognizes or recalls 25 - 49% of the time/requires cueing 50 - 75% of the time   Short Term Goals: Week 1: SLP Short Term Goal 1 (Week 1): Pt will sustain her attention to basic, familiar tasks for 1 minute intervals with mod assist multimodal cues for redirection.   SLP Short Term Goal 2 (Week 1): Pt will intiate and complete basic, familiar tasks with no more than 5 verbal cues for sequencing and organization.  SLP Short Term Goal 3 (Week 1): Pt will utilize external aids to orient to place, date, and situation with mod assist verbal and visual cues.   SLP Short Term Goal 4 (Week 1): Pt will utilize call bell to convey needs and wants to caregivers with max assist multimodal cues.  SLP Short Term Goal 5 (Week 1): Pt will consume regular textures and thin liquids with mod I use of swallowing precautions.   Refer to Care Plan for Long Term Goals  Recommendations for other services: Neuropsych  Discharge Criteria: Patient will be discharged from SLP if patient refuses treatment 3 consecutive times without medical reason, if treatment goals not met, if there is a change in medical status, if patient makes no progress towards goals or if patient is discharged from hospital.  The above assessment, treatment plan, treatment alternatives and goals were discussed and mutually agreed upon: by patient and by family  Jadarius Commons, Selinda Orion 04/11/2016, 5:46 PM

## 2016-04-11 NOTE — Care Management Note (Signed)
[facility] Individual Statement of Services  Patient Name:  Shelly Mcdaniel  Date:  04/11/2016  Welcome to the [facility].  Our goal is to provide you with an individualized program based on your diagnosis and situation, designed to meet your specific needs.  With this comprehensive rehabilitation program, you will be expected to participate in at least 3 hours of rehabilitation therapies Monday-Friday, with modified therapy programming on the weekends.  Your rehabilitation program will include the following services:  Physical Therapy (PT), Occupational Therapy (OT), Speech Therapy (ST), 24 hour per day rehabilitation nursing, Therapeutic Recreaction (TR), Neuropsychology, Case Management (Social Worker), Rehabilitation Medicine, Nutrition Services and Pharmacy Services  Weekly team conferences will be held on Wednesday to discuss your progress.  Your Social Worker will talk with you frequently to get your input and to update you on team discussions.  Team conferences with you and your family in attendance may also be held.  Expected length of stay: 17-19 days  Overall anticipated outcome: min assist overall  Depending on your progress and recovery, your program may change. Your Social Worker will coordinate services and will keep you informed of any changes. Your Social Worker's name and contact numbers are listed  below.  The following services may also be recommended but are not provided by the [facility]:   Home Health Rehabiltiation Services  Outpatient Rehabilitation Services    Arrangements will be made to provide these services after discharge if needed.  Arrangements include referral to agencies that provide these services.  Your insurance has been verified to be: [employer] Your primary doctor is:  Clinical biochemistHal Stoneking  Pertinent information will be shared with your doctor and your insurance company.  Social Worker:  Dossie DerBecky  Nitzia Perren, SW [phone] or [phone]) [phone]  Information discussed with and copy given to patient by: Lucy Chrisupree, Lakoda Mcanany G, 04/11/2016, 10:49 AM

## 2016-04-11 NOTE — Progress Notes (Signed)
Occupational Therapy Session Note  Patient Details  Name: Shelly SimmerJody D Zanders MRN: [id number] Date of Birth: 09-26-1955  Today's Date: 04/11/2016 OT Individual Time: [id number] OT Individual Time Calculation (min): 57 min     Short Term Goals: Week 1:  OT Short Term Goal 1 (Week 1): Pt will initate and complete UB bathing with min assist and no more than mod instructional cueing.  OT Short Term Goal 2 (Week 1): Pt will perform LB bathing with mod assist sit to stand.   OT Short Term Goal 3 (Week 1): Pt will maintain static sitting balance EOB with supervison in preparation for selfcare tasks and functional transfers.   OT Short Term Goal 4 (Week 1): Pt will complete stand pivot transfer to 3:1 with mod assist 2 consecutive sessions.   OT Short Term Goal 5 (Week 1): Pt will tolerate 70 degrees AAROM at the left shoulder with pain less than 4/10.   Skilled Therapeutic Interventions/Progress Updates:    Pt asleep in bed upon arrival.  Pt required mod multimodal cues to arouse and exhibited difficulty keeping eyes open while in bed.  Pt more engaging seated in w/c although she continued to required max verbal cues to initiate all functional tasks.  Pt required max A for SPT to w/c and sit<>stand at sink to pull up pants.  Pt did not initiate or assist with pulling up pants.  Pt expressed increased pain with all transitional movements, tactile input through LUE, and while donning TED hose.  Pt transferred to recliner with max A for SPT and positioning in recliner.  Pt immediately closed her eyes when resting in recliner.  Focus on increased arousal, task initiation, attention to task, functional transfers, sit<>stand, standing balance, and safety awareness to increase independence with BADLs.  Therapy Documentation Precautions:  Precautions Precautions: Fall Precaution Comments: painful left arm with moderate left hemiparesis  Required Braces or Orthoses: Other Brace/Splint Other Brace/Splint: resting  hand splint for the left hand at night Restrictions Weight Bearing Restrictions: No  Pain: Pain Assessment Pain Assessment: Faces Pain Score: 10-Worst pain ever Pain Type: Acute pain Pain Location: Generalized  RN aware and repositioned  See Function Navigator for Current Functional Status.   Therapy/Group: Individual Therapy  Rich BraveLanier, Makiah Foye Chappell 04/11/2016, 9:29 AM

## 2016-04-11 NOTE — Progress Notes (Signed)
Occupational Therapy Session Note  Patient Details  Name: Shelly Mcdaniel MRN: [id number] Date of Birth: [birth year]  Today's Date: 04/11/2016 OT Individual Time: 1100-1200 OT Individual Time Calculation (min): 60 min     Short Term Goals: Week 1:  OT Short Term Goal 1 (Week 1): Pt will initate and complete UB bathing with min assist and no more than mod instructional cueing.  OT Short Term Goal 2 (Week 1): Pt will perform LB bathing with mod assist sit to stand.   OT Short Term Goal 3 (Week 1): Pt will maintain static sitting balance EOB with supervison in preparation for selfcare tasks and functional transfers.   OT Short Term Goal 4 (Week 1): Pt will complete stand pivot transfer to 3:1 with mod assist 2 consecutive sessions.   OT Short Term Goal 5 (Week 1): Pt will tolerate 70 degrees AAROM at the left shoulder with pain less than 4/10.   Skilled Therapeutic Interventions/Progress Updates:    Upon entering the room, pt seated in recliner chair with husband present in room. Pt verbalizing need for toileting as she feels like she needs to have BM. Pt performed stand pivot transfer with max A into wheelchair. Stand pivot transfer onto standard toilet with use of grab bar. Pt needing assistance for clothing management and hygiene for thoroughness. Pt needing steady assistance while seated on toilet and OT providing education to pt and caregiver regarding CVA risk and secondary stroke. Pt returned to wheelchair in same manner and OT assisted pt with grooming at the sink. Pt remained in wheelchair at end of session with quick release belt donned and call bell within reach.   Therapy Documentation Precautions:  Precautions Precautions: Fall Precaution Comments: painful left arm with moderate left hemiparesis  Required Braces or Orthoses: Other Brace/Splint Other Brace/Splint: resting hand splint for the left hand at night Restrictions Weight Bearing Restrictions: No General:   Vital  Signs:  Pain: Pain Assessment Pain Assessment: No/denies pain  See Function Navigator for Current Functional Status.   Therapy/Group: Individual Therapy  Lowella Gripittman, Simran Bomkamp L 04/11/2016, 6:06 PM

## 2016-04-11 NOTE — Progress Notes (Addendum)
Nutrition Follow-up  DOCUMENTATION CODES:   Not applicable  INTERVENTION:  72 hour calorie count initiated.  Provide Glucerna Shake po TID, each supplement provides 220 kcal and 10 grams of protein.  Continue nocturnal tube feeds of Jevity 1.2 at goal rate of 70 ml/hr x 12 hours (5pm-5am) via Cortrak NGT with 30 ml Prostat once daily to provide 1360 kcal (76% of needs), 69 grams of protein (95% of protein needs), and 638 ml of H2O.   Continue free water flushes of 100 ml QID per tube.   RD to continue to monitor.   NUTRITION DIAGNOSIS:   Inadequate oral intake related to lethargy/confusion, nausea as evidenced by  (No meal intake x 4 days); poor po; ongoing  GOAL:   Patient will meet greater than or equal to 90% of their needs; not met  MONITOR:   PO intake, Supplement acceptance, Labs, Weight trends, TF tolerance, Skin, I & O's  REASON FOR ASSESSMENT:   Consult Enteral/tube feeding initiation and management  ASSESSMENT:   60 y/o female PMHx DM, HLD, HTN. She experienced a Stroke in August. She presented yesterday with AMS and dysarthria. MRI showed middle cerebellar peduncle likely representing early subcute ischemia. Admitted for comprehensive rehab.   Meal completion has been 10-25%. Pt with poor po intake. Pt reports appetite is good however has dysgeusia. Husband at bedside has been encouraging pt to eat at meals and has been bringing in snacks/food items from outside/home which pt prefers. Calorie count has been initiated. RD to follow up tomorrow with day 1 results. Pt currently has nocturnal tube feeds ordered. RD to continue with orders as pt with poor po intake. RD to order Glucerna to aid in caloric and protein needs.   Labs and medications reviewed. CBG's 157-325 mg/dL.   Diet Order:  Diet Carb Modified Fluid consistency: Thin; Room service appropriate? Yes  Skin:  Reviewed, no issues  Last BM:  10/29  Height:   Ht Readings from Last 1 Encounters:   04/08/16 5' 7" (1.702 m)    Weight:   Wt Readings from Last 1 Encounters:  04/11/16 168 lb 6.9 oz (76.4 kg)    Ideal Body Weight:  61.36 kg  BMI:  Body mass index is 26.38 kg/m.  Estimated Nutritional Needs:   Kcal:  2585-2778  Protein:  73-88g (1-1.2 g/kg bw)  Fluid:  1.8-2 Liters  EDUCATION NEEDS:   No education needs identified at this time  Corrin Parker, MS, RD, LDN Pager # [phone] After hours/ weekend pager # [phone]

## 2016-04-11 NOTE — Progress Notes (Addendum)
[facility] PHYSICAL MEDICINE & REHABILITATION     PROGRESS NOTE    Subjective/Complaints: C/o pain but not able to localize  ROS limited due to cognition  Objective: Vital Signs: Blood pressure (!) 147/84, pulse 100, temperature 98.2 F (36.8 C), temperature source Oral, resp. rate 20, height 5\' 7"  (1.702 m), weight 76.4 kg (168 lb 6.9 oz), SpO2 98 %. Dg Abd Portable 1v  Result Date: 04/09/2016 CLINICAL DATA:  Nasogastric tube placement. EXAM: PORTABLE ABDOMEN - 1 VIEW COMPARISON:  None. FINDINGS: Normal bowel gas pattern. Small caliber nasogastric tube with its tip in the mid to distal stomach. Lower thoracic spine degenerative changes. IMPRESSION: Nasogastric tube tip in the mid to distal stomach. Electronically Signed   By: Beckie SaltsSteven  Reid M.D.   On: 04/09/2016 11:24    Recent Labs  04/08/16 1909 04/10/16 0832  WBC 5.1 6.4  HGB 12.8 12.7  HCT 36.5 36.9  PLT 189 179    Recent Labs  04/08/16 1039 04/08/16 1909 04/10/16 0832  NA 139  --  137  K 3.5  --  3.3*  CL 104  --  106  GLUCOSE 160*  --  328*  BUN 14  --  18  CREATININE 0.73 0.68 0.62  CALCIUM 9.8  --  9.3   CBG (last 3)   Recent Labs  04/10/16 2009 04/11/16 0007 04/11/16 0404  GLUCAP 156* 231* 191*    Wt Readings from Last 3 Encounters:  04/11/16 76.4 kg (168 lb 6.9 oz)  04/05/16 73.3 kg (161 lb 9.6 oz)  01/15/16 75.2 kg (165 lb 12.8 oz)    Physical Exam:  Constitutional: lying comfortably in bed HENT:  Head: Normocephalic and atraumatic.  Right Ear: External ear normal.  Left Ear: External ear normal.  Mouth/Throat: Oropharynx is clear and moist.  Eyes: Conjunctivae and EOM are normal. Pupils are equal, round, and reactive to light. No scleral icterus.  Neck: No JVD present. No tracheal deviation present. No thyromegaly present.  Cardiovascular: Normal rate, regular rhythm and intact distal pulses.  Exam reveals no friction rub.   No murmur heard. Respiratory: Effort normal. No respiratory  distress. She has no wheezes. She has no rales. She exhibits no tenderness.  GI: She exhibits no distension. There is no tenderness. There is no rebound.  Musculoskeletal: She exhibits no edema. Tenderness: left shoulder still tender with PROM. 1/2" sublux.No pain with RUE ROM, diffuse tenderness but has anticipatory pain on lower ribs bilaterally extending into subcostal area Neurological: She is anxious/confused. She displays abnormal reflexes (LUE and LLE 3+). A cranial nerve deficits (left central 7 and tongue deviation.) is present. She exhibits abnormal muscle tone (MAS Left shoulder adductors 2-3/4. biceps 2/4, wrist/finger flexors 2/4. 1/4 knee flexors/plantrar flexors. 4-5 beats of clonus at left ankle--no change). Pain with attempted finger ext and wrist ext and elbow ext Coordination normal.  RUE and RLE 4 to 4+/5. LUE underlying 1/4 deltoid, biceps, triceps, wrist and finger flexors.  LLE 1+ HF, KE and 0/5 ADF/PF.--no change   Skin: No rash noted. No erythema. No pallor.  Psychiatric: She has a normal mood and affect.  Anxious/distracted  Assessment/Plan: 1. 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 Physiatrist is providing close team supervision and 24 hour management of active medical problems listed below. Physiatrist and rehab team continue to assess barriers to discharge/monitor patient progress toward functional and medical goals.  Function:  Bathing Bathing position   Position: Wheelchair/chair at sink  Bathing parts Body parts bathed by patient: Chest, Buttocks, Left upper leg Body parts bathed by helper: Right arm, Left arm, Abdomen, Front perineal area, Right upper leg, Right lower leg, Left lower leg, Back  Bathing assist Assist Level: 2 helpers      Upper Body Dressing/Undressing Upper body dressing   What is the  patient wearing?: Pull over shirt/dress       Pull over shirt/dress - Perfomed by helper: Thread/unthread right sleeve, Thread/unthread left sleeve, Put head through opening, Pull shirt over trunk        Upper body assist        Lower Body Dressing/Undressing Lower body dressing   What is the patient wearing?: Ted Hose, Non-skid slipper socks, Pants       Pants- Performed by helper: Thread/unthread left pants leg, Pull pants up/down, Fasten/unfasten pants, Thread/unthread right pants leg   Non-skid slipper socks- Performed by helper: Don/doff right sock, Don/doff left sock               TED Hose - Performed by helper: Don/doff right TED hose, Don/doff left TED hose  Lower body assist Assist for lower body dressing: 2 Helpers      Financial trader activity did not occur: No continent bowel/bladder event   Toileting steps completed by helper: Adjust clothing prior to toileting, Performs perineal hygiene, Adjust clothing after toileting    Toileting assist Assist level: Two helpers   Transfers Chair/bed transfer   Chair/bed transfer method: Stand pivot Chair/bed transfer assist level: Maximal assist (Pt 25 - 49%/lift and lower) Chair/bed transfer assistive device: Armrests     Locomotion Ambulation     Max distance: 97ft  Assist level: 2 helpers   Wheelchair   Type: Manual Max wheelchair distance: 144ft  Assist Level: Dependent (Pt equals 0%)  Cognition Comprehension Comprehension assist level: Understands basic 25 - 49% of the time/ requires cueing 50 - 75% of the time  Expression Expression assist level: Expresses basis less than 25% of the time/requires cueing >75% of the time.  Social Interaction Social Interaction assist level: Interacts appropriately less than 25% of the time. May be withdrawn or combative.  Problem Solving Problem solving assist level: Solves basic less than 25% of the time - needs direction nearly all the time or does not  effectively solve problems and may need a restraint for safety  Memory Memory assist level: Recognizes or recalls less than 25% of the time/requires cueing greater than 75% of the time   Medical Problem List and Plan: 1.  Left spastic hemiplegia/dysarthria secondary to right pontine infarct with recent extension             -continue therapies today as tolerated, Will reduce to 15/7 schedule 2.  DVT Prophylaxis/Anticoagulation: Lovenox. Monitor for any bleeding episode 3. Pain Management with recent fall and persistent rib cage pain. Also has had prominent spasms/cramping at night  -Lidoderm patch, Baclofen10 mg 3 times a day, Robaxin as needed.  -resumed lyrica (home medication) at 75mg  qhs (was using 75mg  in am and 150mg  in pm at home)--will change to 50mg  BID and slowly titrate 4. Mood: Zoloft 50 mg daily, 5. Neuropsych: This patient is not capable of making decisions on her own behalf.   -HS confusion/altered sleep/wake cycle which has carried over from acute to admission here.  -increase melatonin to 6mg  QHS (home medication)  -keep sleep chart  -need to re-establish sleep wake  cycle  -all labs reviewed today  -ua/ucx negative 6. Skin/Wound Care: Routine skin checks 7. Fluids/Electrolytes/Nutrition:              -appreciate RD assistance with nutrition/TF  [facility]   -replace potassium. 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 Hemoglobin A1c 6.7.  Check blood sugars before meals and at bedtime  -sugars low given sparce po intake  -increase levemir back to 20mg  qhs  -Patient also Glucophage 1000 mg BID PTA and resume as needed. CBG (last 3)   Recent Labs  04/10/16 2009 04/11/16 0007 04/11/16 0404  GLUCAP 156* 231* 191*    9.Hypertension with bouts of  orthostasis. Presently on Norvasc 5 mg daily. Patient also on lisinopril 40 mg daily as well as Accuretic prior to admission.  Resume as needed 10 Hyperlipidemia .Zocor 40 mg daily 12. Spastic left hemiparesis, acute on chronic             -baclofen just resumed on acute                         -titrate to tolerance and effect while on rehab             -consider botox injections             -continue splinting/ROM 13. Poor po intake/altered mentation  -NGT placed  -continue TF at night---encourage PO during the day  -anxiety/cognitive component most prominently      LOS (Days) 3 A FACE TO FACE EVALUATION WAS PERFORMED  KIRSTEINS,ANDREW E 04/11/2016 7:55 AM

## 2016-04-11 NOTE — Progress Notes (Signed)
Physical Therapy Note  Patient Details  Name: Wendie SimmerJody D Villena MRN: [id number] Date of Birth: Oct 19, 1955 Today's Date: 04/11/2016  1345-1445, 60 min individual tx Pain:   Pt somnolent, snoring loudly.  Pt eventually awakened with cold wet washcloth and PT calling her name.  In supine, L.nmr via multimodal cues for 10 x 1: bil bridging, lower trunk rotation, assisted hip and knee flexion> max extension, assisted straight leg raises, L plantar flexion, in sitting 10 x 1 each L lateral leans.  Sit >< stand, wt shifting in standing x 30 seconds x 2, focusing on L stance stability.  Pt with increased dizziness in standing, limiting duration.  Attempted transfer via [place], but pt stated she felt "weird, and dizzy", so pt positioned in supine, bed alarm set and all needs within reach.   Nadeen Shipman 04/11/2016, 12:14 PM

## 2016-04-11 NOTE — Progress Notes (Signed)
Retta Diones, RN Rehab Admission Coordinator Signed Physical Medicine and Rehabilitation  PMR Pre-admission Date of Service: 04/08/2016 2:20 PM  Related encounter: ED to Hosp-Admission (Discharged) from 04/05/2016 in [facility]       _0 Hide copied text PMR Admission Coordinator Pre-Admission Assessment  Patient: Shelly Mcdaniel is an 60 y.o., female MRN: [id number] DOB: 02/24/1956 Height: _1  (172.7 cm) Weight: 73.3 kg (161 lb 9.6 oz)                                                                                                                                              Insurance Information HMO:     PPO: Yes     PCP:       IPA:       80/20:       OTHER:  Group # [id number] PRIMARY: BCBS State      Policy#: [id number]      Subscriber: Tami Lin CM Name: Philis Fendt    Phone#:  [phone]     Fax#: [phone] Pre-Cert#: [id number] from 04/08/16 to 04/21/16 with update due 04/20/16     Employer: Retired Benefits:  Phone #: [phone]     Name: Clement Sayres. Date: 06/14/15     Deduct:  $1080 (met $ 1080)      Out of Pocket Max: [phone] (met [phone])      Life Max: Unlimited CIR: $337 copay per admission and then 70%      SNF: 70% with 100 visit limit Outpatient: 70%     Co-Pay: 30% Home Health: 70%      Co-Pay: 30% DME: 70%     Co-Pay: 30% Providers: in network  Medicaid Application Date:        Case Manager:   Disability Application Date:        Case Worker:    Emergency Contact Information        Contact Information    Name Relation Home Work Mobile   [place] Spouse [phone]  [phone]   Deniz, Esk[place] Daughter [phone]     Zakirah, [place]   [phone]     Current Medical History  Patient Admitting Diagnosis: R pontine infarct  History of Present Illness: A 60 y.o.right handed femalewith history of hypertension, diabetes mellitus, history of falls, CVA August 2017while vacationing at  [place] and received therapies at [facility] residual left-sided weakness maintained on aspirin.Per chart review patient lives with spouse. Used a quad cane prior to admission. She had recently been receiving outpatient therapies.Presented 04/05/2016 with altered mental status and dysarthria. Patient had a fall in March with resultant persistent left rib cage pain and medications adjusted for pain started on baclofen in September, Lyrica in August had been on Neurontin without any significant dosage changes. Urine drug screen negative. In the ED patient tachycardic 124-5:80 systolic pressure in the  170s. CT scan negative for acute changes. MRI showed foci of diffusion restriction within the right hemi-pons and abnormal signal in the middle cerebellar peduncle most likely representing acute early subacute ischemia. No hemorrhage. MRA with no large vessel occlusion or aneurysm. Patient did not receive TPA. EEG with generalized slowing pattern nonspecific consistent with global encephalopathic process. No seizure activity noted. Neurology consulted presently on aspirin and Plavix for CVA prophylaxis. Tolerating a regular consistency diet. Formal physical and occupational therapy evaluations pending. M.D. has requested physical medicine rehabilitation consult.   Total: 8=NIH  Past Medical History      Past Medical History:  Diagnosis Date  . Diabetes mellitus without complication ([place])   . Hyperlipidemia   . Hypertension     Family History  family history includes CAD in her brother and father; Dementia in her mother; Diabetes in her brother, father, other, and sister; Heart disease in her other; Hyperlipidemia in her other; Hypertension in her other; Kidney failure in her brother; Stroke in her brother, father, mother, and other.  Prior Rehab/Hospitalizations: Had inpatient rehab at [facility] for a month and then home 09/17.  Had [facility] neuro rehab 2-3 X a week after discharge  home.  Had CVA at [facility] 01/15/16.   Has the patient had major surgery during 100 days prior to admission? No  Current Medications   Current Facility-Administered Medications:  .  0.9 %  sodium chloride infusion, , Intravenous, Continuous, Robbie Lis, MD, Last Rate: 50 mL/hr at 04/08/16 0056 .  acetaminophen (TYLENOL) tablet 650 mg, 650 mg, Oral, Q6H PRN **OR** acetaminophen (TYLENOL) suppository 650 mg, 650 mg, Rectal, Q6H PRN **OR** acetaminophen (TYLENOL) solution 650 mg, 650 mg, Oral, Q6H PRN, Nishant Dhungel, MD .  amLODipine (NORVASC) tablet 5 mg, 5 mg, Oral, Daily, Robbie Lis, MD, 5 mg at 04/07/16 1213 .  aspirin EC tablet 325 mg, 325 mg, Oral, Daily, 325 mg at 04/07/16 0940 **OR** aspirin suppository 300 mg, 300 mg, Rectal, Daily, Rosalin Hawking, MD, 300 mg at 04/08/16 1420 .  baclofen (LIORESAL) tablet 10 mg, 10 mg, Oral, TID, Robbie Lis, MD, 10 mg at 04/07/16 2102 .  clopidogrel (PLAVIX) tablet 75 mg, 75 mg, Oral, Daily, Rosalin Hawking, MD, 75 mg at 04/07/16 0939 .  enoxaparin (LOVENOX) injection 40 mg, 40 mg, Subcutaneous, Q24H, Kimberly B Hammons, RPH, 40 mg at 04/08/16 1230 .  famotidine (PEPCID) tablet 20 mg, 20 mg, Oral, BID, Theone Murdoch Hammons, RPH, 20 mg at 04/07/16 2102 .  insulin aspart (novoLOG) injection 0-15 Units, 0-15 Units, Subcutaneous, Q4H, Samella Parr, NP, 2 Units at 04/08/16 1230 .  insulin detemir (LEVEMIR) injection 25 Units, 25 Units, Subcutaneous, QHS, Samella Parr, NP, 25 Units at 04/07/16 2101 .  labetalol (NORMODYNE,TRANDATE) injection 10 mg, 10 mg, Intravenous, Q2H PRN, Waldemar Dickens, MD, 10 mg at 04/08/16 0459 .  lidocaine (LIDODERM) 5 % 1 patch, 1 patch, Transdermal, Q24H, Samella Parr, NP, 1 patch at 04/07/16 2300 .  LORazepam (ATIVAN) injection 0.5-1 mg, 0.5-1 mg, Intravenous, Q4H PRN, Samella Parr, NP .  magnesium sulfate IVPB 1 g 100 mL, 1 g, Intravenous, Once, Nishant Dhungel, MD .  methocarbamol (ROBAXIN) 500 mg in dextrose 5  % 50 mL IVPB, 500 mg, Intravenous, Q6H PRN, Samella Parr, NP, 500 mg at 04/08/16 0301 .  morphine 4 MG/ML injection 1-4 mg, 1-4 mg, Intravenous, Q2H PRN, Samella Parr, NP .  polyethylene glycol (MIRALAX / GLYCOLAX) packet 17  g, 17 g, Oral, Daily, Robbie Lis, MD, 17 g at 04/07/16 2259 .  promethazine (PHENERGAN) injection 12.5 mg, 12.5 mg, Intravenous, Q4H PRN, Courteney Lyn Mackuen, MD, 12.5 mg at 04/05/16 1436 .  scopolamine (TRANSDERM-SCOP) 1 MG/3DAYS 1.5 mg, 1 patch, Transdermal, Q72H, Samella Parr, NP, 1.5 mg at 04/05/16 2323 .  simvastatin (ZOCOR) tablet 40 mg, 40 mg, Oral, QHS, Rosalin Hawking, MD, 40 mg at 04/07/16 2102  Patients Current Diet: Diet Carb Modified Fluid consistency: Thin; Room service appropriate? Yes  Precautions / Restrictions Precautions Precautions: Fall Restrictions Weight Bearing Restrictions: No   Has the patient had 2 or more falls or a fall with injury in the past year?Yes.  Had fall X 1 a year ago with intercostal injury with treatment.  Prior Activity Level Community (5-7x/wk): Went out daily.  Was going to outpatient rehab at neuro rehab 2-3 X a week.  Was a Stage manager at [employer].  Home Assistive Devices / Equipment Home Equipment: Cane - quad, Bedside commode, Tub bench, Grab bars - tub/shower, Wheelchair - manual  Prior Device Use: Indicate devices/aids used by the patient prior to current illness, exacerbation or injury? Manual wheelchair, Information systems manager  Prior Functional Level Prior Function Level of Independence: Needs assistance Gait / Transfers Assistance Needed: face to face assisted transfers  Self Care: Did the patient need help bathing, dressing, using the toilet or eating?  Independent  Indoor Mobility: Did the patient need assistance with walking from room to room (with or without device)? Independent  Stairs: Did the patient need assistance with internal or external stairs (with or without device)? Needed some  help  Functional Cognition: Did the patient need help planning regular tasks such as shopping or remembering to take medications? Independent  Current Functional Level Cognition Overall Cognitive Status: Impaired/Different from baseline Current Attention Level: Focused Orientation Level: Oriented to person, Oriented to place, Disoriented to time, Disoriented to situation Following Commands: Follows one step commands with increased time Safety/Judgement: Decreased awareness of safety, Decreased awareness of deficits General Comments: Pt with difficulty sequencing swallowing medication. pt crying at times in frustration. pt needed cues multiple times to initiate task. pt requires tactile input to help facilitate muscle activation with transfers    Extremity Assessment (includes Sensation/Coordination) Upper Extremity Assessment: LUE deficits/detail LUE Deficits / Details: digits held in flexed position at rest, pt noted to have tone throughout UE. pt with forearm tendons felt with light palpation. pt with tears with slightly digit extension but when distracted able to fully range digits but wrist is slight flexed. pt with good shoulder placement no subluxation noted. Pt without scapula activation noted during session btu patient guarding L Ue due to pain LUE Sensation: decreased light touch LUE Coordination: decreased fine motor, decreased gross motor  Lower Extremity Assessment: Defer to PT evaluation RLE Deficits / Details: WFL LLE Deficits / Details: Trace synergistic movement quads/hams, 2/5 hip flexors.  But shows more control of R knee in stance, with more guarding than support LLE Coordination: decreased fine motor, decreased gross motor   ADLs Overall ADL's : Needs assistance/impaired Eating/Feeding: Maximal assistance Eating/Feeding Details (indicate cue type and reason): pt holding ice cream in mouth and then spitting out in dislike. pt noted to still pocket even after spitting it  out Grooming: Wash/dry hands, Wash/dry face, Brushing hair, Maximal assistance Grooming Details (indicate cue type and reason): pt provided hair combing and tearful and states "ouch" with hair being pulled back into a clip.  Upper Body Bathing: Maximal assistance Lower Body Bathing: Maximal assistance Upper Body Dressing : Maximal assistance Lower Body Dressing: Maximal assistance Toilet Transfer: +2 for physical assistance, Maximal assistance Functional mobility during ADLs: +2 for physical assistance, Maximal assistance General ADL Comments: Pt requires (A) for all weight shifting and total (A) to advance L LE   Mobility Overal bed mobility: Needs Assistance Bed Mobility: Rolling, Sidelying to Sit Rolling: Max assist Sidelying to sit: Max assist General bed mobility comments: in chair on arrival   Transfers Overall transfer level: Needs assistance Equipment used: 2 person hand held assist Transfers: Sit to/from Stand Sit to Stand: +2 physical assistance, Mod assist Stand pivot transfers: Max assist General transfer comment: Pt needed cues for upright posture. Pt requires (A) for L LE advancement. pt needed (A) to faciliate weight shift   Ambulation / Gait / Stairs / Wheelchair Mobility Ambulation/Gait Ambulation/Gait assistance: Mod assist, +2 physical assistance, Max assist Ambulation Distance (Feet): 3 Feet Assistive device: 2 person hand held assist Gait Pattern/deviations: Step-to pattern General Gait Details: paretic gait on the right.  pt needed assist with w/shift and advancing L LE   Posture / Balance Dynamic Sitting Balance Sitting balance - Comments: R lateral lean Balance Overall balance assessment: Needs assistance Sitting-balance support: Single extremity supported Sitting balance-Leahy Scale: Poor Sitting balance - Comments: R lateral lean Standing balance-Leahy Scale: Poor Standing balance comment: needs external support, lists posteriorly,  Worked in standing 4-5  mins on w/shift and stepping.   Special needs/care consideration BiPAP/CPAP No CPM No Continuous Drip IV 0.9% NS 50 mL/hr Dialysis No      Life Vest No Oxygen No Special Bed No Trach Size No Wound Vac (area) No      Skin Dry skin, reddened area on left shoulder, has left foot brace for foot drop/no dorsi flexion                             Bowel mgmt: Last BM 04/05/16 Bladder mgmt: Incontinence Diabetic mgmt Yes, on oral medications and insulin at home   Previous Home Environment Living Arrangements: Spouse/significant other  Lives With: Spouse Available Help at Discharge: Family, Available 24 hours/day Type of Home: House Home Layout: One level, Able to live on main level with bedroom/bathroom Home Access: Ramped entrance Bathroom Shower/Tub: Tub/shower unit, Curtain Bathroom Toilet: Standard Additional Comments: patient has custom made splint for L UE that has been off for x1 week. Pt has toe lift for L shoe  Discharge Living Setting Plans for Discharge Living Setting: Patient's home, House, Lives with (comment) (Lives with husband.) Type of Home at Discharge: House Discharge Home Layout: Two level, Able to live on main level with bedroom/bathroom Alternate Level Stairs-Number of Steps: Flight Discharge Home Access: Stairs to enter, Ramped entrance (3 step entry and a ramp in place.) Entrance Stairs-Number of Steps: 3 steps entry and a ramp in place.  Social/Family/Support Systems Patient Roles: Spouse, Parent (Has a husband, son and daughter.) Son is a paramedic in [place] and Daughter is an anesthesia resident in [place]. Sport and exercise psychologist Information: Kendelle Schweers - husband Anticipated Caregiver: husband Anticipated Caregiver's Contact Information: Quillian Quince - husband - (c) [phone] Ability/Limitations of Caregiver: Husband can assist and has been assisting at home as needed. Caregiver Availability: 24/7 Discharge Plan Discussed with Primary Caregiver: Yes Is Caregiver In  Agreement with Plan?: Yes Does Caregiver/Family have Issues with Lodging/Transportation while Pt is in Rehab?: No  Goals/Additional Needs Patient/Family Goal  for Rehab: PT/OT/SLP min assist goals Expected length of stay: 18-22 days Cultural Considerations: Christian Dietary Needs: Carb mod, med cal, thin liquids Equipment Needs: TBD Pt/Family Agrees to Admission and willing to participate: Yes Program Orientation Provided & Reviewed with Pt/Caregiver Including Roles  & Responsibilities: Yes  Decrease burden of Care through IP rehab admission: N/A  Possible need for SNF placement upon discharge: Not planned  Patient Condition: This patient's condition remains as documented in the consult dated 04/07/16, in which the Rehabilitation Physician determined and documented that the patient's condition is appropriate for intensive rehabilitative care in an inpatient rehabilitation facility. Will admit to inpatient rehab today.  Preadmission Screen Completed By:  Retta Diones, 04/08/2016 2:56 PM ______________________________________________________________________   Discussed status with Dr. Naaman Plummer on 04/08/16 at 1451 and received telephone approval for admission today.  Admission Coordinator:  Retta Diones, time1451/Date10/27/17       Cosigned by: Meredith Staggers, MD at 04/08/2016 3:07 PM  Revision History

## 2016-04-11 NOTE — Progress Notes (Signed)
Social Work Assessment and Plan Social Work Assessment and Plan  Patient Details  Name: Shelly Mcdaniel MRN: [id number] Date of Birth: 06/28/1955  Today's Date: 04/11/2016  Problem List:  Patient Active Problem List   Diagnosis Date Noted  . Orthostatic hypotension 04/08/2016  . Muscle cramps 04/08/2016  . Right pontine cerebrovascular accident ([place]) 04/08/2016  . Spastic hemiparesis of left nondominant side ([place]) 04/08/2016  . Cerebral thrombosis with cerebral infarction 04/06/2016  . Basilar artery stenosis   . Dysarthria 04/05/2016  . History of CVA with residual deficit 04/05/2016  . Sinus tachycardia 04/05/2016  . Acute encephalopathy 04/05/2016  . Chronic pain 04/05/2016  . Hyperlipidemia 04/05/2016  . Ischemic stroke ([place])   . 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 TIA (transient ischemic attack) 11/07/2014  . Uncontrolled hypertension 11/07/2014  . DM II (diabetes mellitus, type II), controlled ([place]) 11/07/2014   Past Medical History:  Past Medical History:  Diagnosis Date  . Diabetes mellitus without complication ([place])   . Hyperlipidemia   . Hypertension    Past Surgical History:  Past Surgical History:  Procedure Laterality Date  . BACK SURGERY    . BREAST SURGERY    . COLONOSCOPY WITH PROPOFOL N/A 12/24/2013   Procedure: COLONOSCOPY WITH PROPOFOL;  Surgeon: [name], MD;  Location: [facility];  Service: Endoscopy;  Laterality: N/A;   Social History:  reports that she has never smoked. She has never used smokeless tobacco. She reports that she does not drink alcohol or use drugs.  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 They have many friends and church members who are supportive and visit. Pt feels very grateful to have the support she has.  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   Abuse/Neglect Physical Abuse: Denies Verbal Abuse: Denies Sexual Abuse: Denies Exploitation of patient/patient's resources: Denies Self-Neglect: Denies  Emotional Status Pt's affect, behavior adn adjustment status: Pt is motivated to improve and be able to do as much as she can for herself before leaving rehab. She has been through this before after her first stroke in Aug 2017 and now is having to do it all over again. She feels this one is worse than her first. 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 Recent Psychosocial Issues: recent CVA in Aug and now this. Was still going to [facility] for therapies. Pyschiatric History: No history limited with her verbailizations due to language difficulties from CVA. Feel she would benefit from seeing neuro-psych while here, due to second stroke in a short period of time, to assist with coping.  Will wait until appropriate to refer. Substance Abuse History: No issues  Patient / Family Perceptions, Expectations & Goals Pt/Family understanding of illness & functional limitations: Pt and husband have  a good understanding of her stroke and deficits. Husband has to stop himself  from answering for pt and allow her the time to respond. They talk with the MD daily and feel their questions and concerns are being addressed. Premorbid pt/family roles/activities: Wife, Mother, student, church member, friend, etc Anticipated changes in roles/activities/participation: resume Pt/family expectations/goals: Pt states: " I want to do well here and recover as much as I can, I want to do for myself."  Husband states: " I will help her like I was, but I know she wants to do for herself."  [employer]: Other (Comment) (Active Pt at [facility]) Premorbid Home Care/DME Agencies: Other (Comment) (has from Aug) Transportation available at discharge: Husband Resource referrals recommended: Neuropsychology, Support group (specify)  Discharge Planning Living Arrangements: Spouse/significant other Support Systems: Spouse/significant other, Children, Friends/neighbors, Social worker community Type of Residence: Private residence [employer] Resources: Multimedia programmer (specify) Printmaker) Financial Resources: Other (Comment), Family Support (pension) Financial Screen Referred: No Living Expenses: Own Money Management: Spouse, Patient Does the patient have any problems obtaining your medications?: No Home Management: Husband has been since Aug after first CVA Patient/Family Preliminary Plans: Return home with husband who can assist with her care, he was prior to admission. He plans to be here often and participate in her care and recovery. Made aware of team conference on Wed will have more information to share and work toward a target date.  Social Work Anticipated Follow Up Needs: HH/OP, Support Group  Clinical Impression Pt is dealing with another stroke in a short period of time, she was making good progress after her first stroke and going to therapies at the OP center. Her husband is very involved and willing to assist her. Pt is motivated and wants To  make progress here. Will work on discharge needs and refer pt to neuro-psych when appropriate. Husband plans to be here and participate in therapies to see her progress and learn her care.  Elease Hashimoto 04/11/2016, 1:18 PM

## 2016-04-12 ENCOUNTER — Inpatient Hospital Stay (HOSPITAL_COMMUNITY): Payer: BC Managed Care – PPO | Admitting: Physical Therapy

## 2016-04-12 ENCOUNTER — Inpatient Hospital Stay (HOSPITAL_COMMUNITY): Payer: BC Managed Care – PPO | Admitting: Speech Pathology

## 2016-04-12 ENCOUNTER — Telehealth: Payer: Self-pay

## 2016-04-12 ENCOUNTER — Ambulatory Visit: Payer: BC Managed Care – PPO | Admitting: Occupational Therapy

## 2016-04-12 ENCOUNTER — Ambulatory Visit: Payer: BC Managed Care – PPO | Admitting: Physical Therapy

## 2016-04-12 ENCOUNTER — Ambulatory Visit: Payer: BC Managed Care – PPO

## 2016-04-12 ENCOUNTER — Inpatient Hospital Stay (HOSPITAL_COMMUNITY): Payer: BC Managed Care – PPO | Admitting: Occupational Therapy

## 2016-04-12 ENCOUNTER — Inpatient Hospital Stay (HOSPITAL_COMMUNITY): Payer: BC Managed Care – PPO

## 2016-04-12 LAB — BASIC METABOLIC PANEL
ANION GAP: 6 (ref 5–15)
BUN: 14 mg/dL (ref 6–20)
CO2: 26 mmol/L (ref 22–32)
Calcium: 9.1 mg/dL (ref 8.9–10.3)
Chloride: 107 mmol/L (ref 101–111)
Creatinine, Ser: 0.5 mg/dL (ref 0.44–1.00)
GFR calc Af Amer: >60 mL/min (ref >60)
GLUCOSE: 295 mg/dL — AB (ref 65–99)
POTASSIUM: 3.2 mmol/L — AB (ref 3.5–5.1)
Sodium: 139 mmol/L (ref 135–145)

## 2016-04-12 LAB — GLUCOSE, CAPILLARY
GLUCOSE-CAPILLARY: 139 mg/dL — AB (ref 65–99)
Glucose-Capillary: 275 mg/dL — ABNORMAL HIGH (ref 65–99)
Glucose-Capillary: 302 mg/dL — ABNORMAL HIGH (ref 65–99)
Glucose-Capillary: 181 mg/dL — ABNORMAL HIGH (ref 65–99)

## 2016-04-12 MED ORDER — POTASSIUM CHLORIDE 20 MEQ/15ML (10%) PO SOLN
20.0000 meq | Freq: Two times a day (BID) | ORAL | Status: DC
  Administered 2016-04-12 – 2016-04-28 (×33): 20 meq
  Filled 2016-04-12 (×33): qty 15

## 2016-04-12 MED ORDER — POLYETHYLENE GLYCOL 3350 17 G PO PACK
17.0000 g | PACK | Freq: Two times a day (BID) | ORAL | Status: DC
  Administered 2016-04-12 – 2016-04-30 (×27): 17 g via ORAL
  Filled 2016-04-12 (×35): qty 1

## 2016-04-12 MED ORDER — INSULIN DETEMIR 100 UNIT/ML ~~LOC~~ SOLN
25.0000 [IU] | Freq: Every day | SUBCUTANEOUS | Status: DC
  Administered 2016-04-12 – 2016-04-16 (×5): 25 [IU] via SUBCUTANEOUS
  Filled 2016-04-12 (×6): qty 0.25

## 2016-04-12 NOTE — Progress Notes (Signed)
Speech Language Pathology Daily Session Note  Patient Details  Name: Shelly Mcdaniel MRN: [id number] Date of Birth: 01/01/1956  Today's Date: 04/12/2016 SLP Individual Time: 0830-0900 SLP Individual Time Calculation (min): 30 min   Short Term Goals: Week 1: SLP Short Term Goal 1 (Week 1): Pt will sustain her attention to basic, familiar tasks for 1 minute intervals with mod assist multimodal cues for redirection.   SLP Short Term Goal 2 (Week 1): Pt will intiate and complete basic, familiar tasks with no more than 5 verbal cues for sequencing and organization.  SLP Short Term Goal 3 (Week 1): Pt will utilize external aids to orient to place, date, and situation with mod assist verbal and visual cues.   SLP Short Term Goal 4 (Week 1): Pt will utilize call bell to convey needs and wants to caregivers with max assist multimodal cues.  SLP Short Term Goal 5 (Week 1): Pt will consume regular textures and thin liquids with mod I use of swallowing precautions.   Skilled Therapeutic Interventions:Pt seen for skilled SLP treatment with emphasis on cognition in the functional setting of PO intake. Pt required consistent mod-max verbal cueing and direction to attend to the task of self-feeding. Pt able to recall biographical information with ~70% acc with min A for repetition of questions to gain attention.   Function:  Eating Eating   Modified Consistency Diet: No Eating Assist Level: Supervision or verbal cues;Set up assist for;Helper checks for pocketed food   Eating Set Up Assist For: Opening containers       Cognition Comprehension Comprehension assist level: Understands basic 75 - 89% of the time/ requires cueing 10 - 24% of the time  Expression   Expression assist level: Expresses basic 50 - 74% of the time/requires cueing 25 - 49% of the time. Needs to repeat parts of sentences.  Social Interaction Social Interaction assist level: Interacts appropriately 25 - 49% of time - Needs  frequent redirection.  Problem Solving Problem solving assist level: Solves basic 25 - 49% of the time - needs direction more than half the time to initiate, plan or complete simple activities  Memory Memory assist level: Recognizes or recalls 25 - 49% of the time/requires cueing 50 - 75% of the time    Pain Pain Assessment Pain Assessment: No/denies pain  Therapy/Group: Individual Therapy  Rocky CraftsKara E Cambree Hendrix MA, CCC-SLP 04/12/2016, 4:56 PM

## 2016-04-12 NOTE — Progress Notes (Signed)
Occupational Therapy Session Note  Patient Details  Name: Shelly Mcdaniel MRN: [id number] Date of Birth: [birth year]  Today's Date: 04/12/2016 OT Individual Time: 0900-1000 OT Individual Time Calculation (min): 60 min     Short Term Goals: Week 1:  OT Short Term Goal 1 (Week 1): Pt will initate and complete UB bathing with min assist and no more than mod instructional cueing.  OT Short Term Goal 2 (Week 1): Pt will perform LB bathing with mod assist sit to stand.   OT Short Term Goal 3 (Week 1): Pt will maintain static sitting balance EOB with supervison in preparation for selfcare tasks and functional transfers.   OT Short Term Goal 4 (Week 1): Pt will complete stand pivot transfer to 3:1 with mod assist 2 consecutive sessions.   OT Short Term Goal 5 (Week 1): Pt will tolerate 70 degrees AAROM at the left shoulder with pain less than 4/10.   Skilled Therapeutic Interventions/Progress Updates:   1:1 Engaged in self care retraining at sink level. Pt perform bed mobility to come to EOB (off of right side of bed) with mod A. Mod A stand pivot to the chair with A for advancement of left LE. Focus on multimodal cues with max A for following one step commands related to bathing and dressing tasks. Pt able to initiate using right hand to perform tasks but required max A to sustain attention to complete task. Pt requires total A total A to engaged left UE/ LE in tasks and is resistive towards movement of left UE due to pain (kept in flexor pattern). Engaged in grooming at sink including hair washing with focus on sustained attention and functional problem solving. Pt able to perform sit to stands with mod A with increased activity of hip and knee extension but decr activation when distracted by a task (ie pulling up pants). Left in w/c with husband present.   Therapy Documentation Precautions:  Precautions Precautions: Fall Precaution Comments: painful left arm with moderate left hemiparesis  Required  Braces or Orthoses: Other Brace/Splint Other Brace/Splint: resting hand splint for the left hand at night Restrictions Weight Bearing Restrictions: No General:   Vital Signs: Therapy Vitals Temp: 97.9 F (36.6 C) Temp Source: Oral Pulse Rate: (!) 111 Resp: 18 BP: 140/81 Patient Position (if appropriate): Lying Oxygen Therapy SpO2: 95 % O2 Device: Not Delivered Pain:  left Ue pain with movement and pain with extension of left fingers during washing of hands., See Function Navigator for Current Functional Status.   Therapy/Group: Individual Therapy  [name], [name] 04/12/2016, 3:19 PM

## 2016-04-12 NOTE — Telephone Encounter (Signed)
If not possible, next available should be OK. Thanks.   Shelly PlanJindong Kyllian Clingerman, MD PhD Stroke Neurology 04/12/2016 5:27 PM

## 2016-04-12 NOTE — Progress Notes (Signed)
Physical Therapy Session Note  Patient Details  Name: GWENDALYN MCGONAGLE MRN: [id number] Date of Birth: 05-Apr-1956  Today's Date: 04/12/2016 PT Individual Time: 1100-1159 PT Individual Time Calculation (min): 59 min    Short Term Goals: Week 1:  PT Short Term Goal 1 (Week 1): Patient will consistently perform sit<>stand with Mod Assist and RLAD PT Short Term Goal 2 (Week 1): Patient will perform stand pivot/ squat pivot with mod assist consistently.  PT Short Term Goal 3 (Week 1): Patient will ambulate 55f with max Assist and LRAD  PT Short Term Goal 4 (Week 1): Patient will perform all bed mobility with mod assist from PT.  PT Short Term Goal 5 (Week 1): Patient will initiate WC mobility training.    Therapy Documentation Precautions:  Precautions Precautions: Fall Precaution Comments: painful left arm with moderate left hemiparesis  Required Braces or Orthoses: Other Brace/Splint Other Brace/Splint: resting hand splint for the left hand at night Restrictions Weight Bearing Restrictions: No General:   Vital Signs: Therapy Vitals Temp: 97.9 F (36.6 C) Temp Source: Oral Pulse Rate: (!) 111 Resp: 18 BP: 140/81 Patient Position (if appropriate): Lying Oxygen Therapy SpO2: 95 % O2 Device: Not Delivered Pain: Patient reports 6/10 pain on left side of body generalized.  Patient received in recliner chair with spouse present during session for observation.   Patient performed squat pivot transfer from recliner chair to wheelchair mod assist.   Sit to and from stand transfer with right handrail or right WBQC min assist. Manual facilitation for anterior weight shift, upright posture. Verbal cues for upright posture.  Patient stood for 4 trials focusing on even weight distribution, positioning in midline, upright posture, hip extension and scapular retraction. Patient stood with min to max assist varying throughout activity. Patient both internally and externally distracted  throughout session. Patient stood each trial between 2 and 4 minutes. Patient also performed rapid stepping for two trials of 30 seconds during standing with right handrail in order to promote stepping strategies and increased weight bearing on LLE.   Patient ambulated 15 feet with right handrail mod assist overall. Patient ambulated with a varied step to and step through pattern. Patient able to initiate advancement and of LLE, manual facilitation for increased step length and lateral weight shift. Patient required mod assist for LLE stabilization   Patient ambulated 10 feet with right WBQC max assist. Patient ambulated with a step to pattern. Patient required increased assistance for advancement, placement and stabilization of LLE.   Block practice sit to and from stand transfer min to mod assist 5x2 with emphasis on anterior weight shift and force production through BLE evenly distributed .   Patient returned to room at end of session with RN Angie present and quick release belt engaged and all needs met.      See Function Navigator for Current Functional Status.   Therapy/Group: Individual Therapy  WRetta Diones10/31/2017, 2:12 PM

## 2016-04-12 NOTE — Progress Notes (Signed)
Patient tolerated dysphasia diet thin liquids with less than 50% of each meal eaten. Cortrak tube out of nares this am. Cortrak tube replaced per MD order this pm . Awaiting KUB for placement verification. Continue with plan of care. Cleotilde NeerJoyce, Naraly Fritcher S

## 2016-04-12 NOTE — Telephone Encounter (Signed)
Information fax to Dr. Roda Shut[facility]Xu [facility].

## 2016-04-12 NOTE — Progress Notes (Signed)
Calorie Count Note  72-hour calorie count ordered.  Diet: Carb modified Supplements: Glucerna oral nutrition supplement TID  04/11/16 Lunch: 220 kcal 11 grams protein  Dinner: no information available  04/12/16 Breakfast: 490 kcal 22 grams protein  Lunch: 125 kcal 3 grams protein  Total intake: 835 kcal (46.4% of minimum estimated needs) 36 grams protein (49.3% of minimum estimated needs)  Nutrition dx: Inadequate oral intake related to lethargy/confusion, nausea as evidenced by  (No meal intake x 4 days); poor po; ongoing  Goal: Patient will meet greater than or equal to 90% of their needs; not met  Intervention: - Continue 72 hour calorie count. - Provide Glucerna Shake po TID, each supplement provides 220 kcal and 10 grams of protein. - Continue nocturnal tube feeds of Jevity 1.2 at goal rate of 70 ml/hr x 12 hours (5pm-5am) via Cortrak NGT with 30 ml Prostat once daily to provide 1360 kcal (76% of needs), 69 grams of protein (95% of protein needs), and 638 ml of H2O.  - Continue free water flushes of 100 ml QID per tube.  Shelly Mcdaniel Dietetic Intern Pager Number: [phone]

## 2016-04-12 NOTE — Progress Notes (Signed)
[facility] PHYSICAL MEDICINE & REHABILITATION     PROGRESS NOTE    Subjective/Complaints: C/o pain first states chest wall but then cannot explain where. Husband at bedside and he is pleased with progress NGT is out  Ate ~20-25% meal yesterday  ROS limited due to cognition  Objective: Vital Signs: Blood pressure (!) 120/93, pulse 96, temperature 97.8 F (36.6 C), temperature source Oral, resp. rate 17, height 5\' 7"  (1.702 m), weight 74.8 kg (164 lb 14.5 oz), SpO2 98 %. No results found.  Recent Labs  04/10/16 0832  WBC 6.4  HGB 12.7  HCT 36.9  PLT 179    Recent Labs  04/10/16 0832 04/12/16 0506  NA 137 139  K 3.3* 3.2*  CL 106 107  GLUCOSE 328* 295*  BUN 18 14  CREATININE 0.62 0.50  CALCIUM 9.3 9.1   CBG (last 3)   Recent Labs  04/11/16 2012 04/11/16 2344 04/12/16 0408  GLUCAP 192* 329* 275*    Wt Readings from Last 3 Encounters:  04/12/16 74.8 kg (164 lb 14.5 oz)  04/05/16 73.3 kg (161 lb 9.6 oz)  01/15/16 75.2 kg (165 lb 12.8 oz)    Physical Exam:  Constitutional: lying comfortably in bed HENT:  Head: Normocephalic and atraumatic.  Right Ear: External ear normal.  Left Ear: External ear normal.  Mouth/Throat: Oropharynx is clear and moist.  Eyes: Conjunctivae and EOM are normal. Pupils are equal, round, and reactive to light. No scleral icterus.  Neck: No JVD present. No tracheal deviation present. No thyromegaly present.  Cardiovascular: Normal rate, regular rhythm and intact distal pulses.  Exam reveals no friction rub.   No murmur heard. Respiratory: Effort normal. No respiratory distress. She has no wheezes. She has no rales. She exhibits no tenderness.  GI: She exhibits no distension. There is no tenderness. There is no rebound.  Musculoskeletal: She exhibits no edema. Tenderness: left shoulder still tender with PROM. 1/2" sublux.No pain with RUE ROM, No tenderness in chest or in RUE, BLE, pain with Left shoulder palpation , as well as ROM  LUENeurological: She is anxious/confused. She displays abnormal reflexes (LUE and LLE 3+). A cranial nerve deficits (left central 7 and tongue deviation.) is present. She exhibits abnormal muscle tone (MAS Left shoulder adductors 2-3/4. biceps 2/4, wrist/finger flexors 2/4. 1/4 knee flexors/plantrar flexors. 4-5 beats of clonus at left ankle--no change). Pain with attempted finger ext and wrist ext and elbow ext Coordination normal.  RUE and RLE 4 to 4+/5. LUE underlying 1/4 deltoid, biceps, triceps, wrist and finger flexors.  LLE 1+ HF, KE and 0/5 ADF/PF.--no change   Skin: No rash noted. No erythema. No pallor.  Psychiatric: She has a normal mood and affect.  Anxious/distracted  Assessment/Plan: 1. 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 Physiatrist is providing close team supervision and 24 hour management of active medical problems listed below. Physiatrist and rehab team continue to assess barriers to discharge/monitor patient progress toward functional and medical goals.  Function:  Bathing Bathing position   Position: Wheelchair/chair at sink  Bathing parts Body parts bathed by patient: Chest, Left upper leg Body parts bathed by helper: Right arm, Left arm, Abdomen, Front perineal area, Right upper leg, Right lower leg, Left lower leg, Back, Buttocks  Bathing assist Assist Level: 2 helpers      Upper Body Dressing/Undressing Upper body dressing   What is the patient wearing?:  Pull over shirt/dress       Pull over shirt/dress - Perfomed by helper: Thread/unthread right sleeve, Thread/unthread left sleeve, Put head through opening, Pull shirt over trunk        Upper body assist        Lower Body Dressing/Undressing Lower body dressing   What is the patient wearing?: Pants, Non-skid slipper socks, Ted Hose       Pants-  Performed by helper: Thread/unthread left pants leg, Pull pants up/down, Fasten/unfasten pants, Thread/unthread right pants leg   Non-skid slipper socks- Performed by helper: Don/doff right sock, Don/doff left sock               TED Hose - Performed by helper: Don/doff right TED hose, Don/doff left TED hose  Lower body assist Assist for lower body dressing: 2 Helpers      Financial trader activity did not occur: No continent bowel/bladder event   Toileting steps completed by helper: Adjust clothing prior to toileting, Performs perineal hygiene, Adjust clothing after toileting    Toileting assist Assist level: Two helpers   Transfers Chair/bed transfer   Chair/bed transfer method: Stand pivot Chair/bed transfer assist level: Maximal assist (Pt 25 - 49%/lift and lower) Chair/bed transfer assistive device: Armrests     Locomotion Ambulation     Max distance: 45ft  Assist level: 2 helpers   Wheelchair   Type: Manual Max wheelchair distance: 135ft  Assist Level: Dependent (Pt equals 0%)  Cognition Comprehension Comprehension assist level: Understands basic 75 - 89% of the time/ requires cueing 10 - 24% of the time  Expression Expression assist level: Expresses basic 50 - 74% of the time/requires cueing 25 - 49% of the time. Needs to repeat parts of sentences.  Social Interaction Social Interaction assist level: Interacts appropriately 50 - 74% of the time - May be physically or verbally inappropriate.  Problem Solving Problem solving assist level: Solves basic 25 - 49% of the time - needs direction more than half the time to initiate, plan or complete simple activities  Memory Memory assist level: Recognizes or recalls 25 - 49% of the time/requires cueing 50 - 75% of the time   Medical Problem List and Plan: 1.  Left spastic hemiplegia/dysarthria secondary to right pontine infarct with recent extension             -continue therapies today as tolerated, team  conf in am 2.  DVT Prophylaxis/Anticoagulation: Lovenox. Monitor for any bleeding episode 3. Pain Management with recent fall and persistent rib cage pain. Also has had prominent spasms/cramping at night  -Lidoderm patch, Baclofen10 mg 3 times a day, Robaxin as needed.  -resumed lyrica (home medication) at 75mg  qhs (was using 75mg  in am and 150mg  in pm at home)--will cont 50mg  BID  4. Mood: Zoloft 50 mg daily, 5. Neuropsych: This patient is not capable of making decisions on her own behalf.   -HS confusion/altered sleep/wake cycle which has carried over from acute to admission here.  -increase melatonin to 6mg  QHS (home medication)  -keep sleep chart  -need to re-establish sleep wake cycle  -all labs reviewed today  -ua/ucx negative 6. Skin/Wound Care: Routine skin checks 7. Fluids/Electrolytes/Nutrition:              -appreciate RD assistance with nutrition/TF  -continue TF at [facility] for now   -replace potassium. 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 Hemoglobin A1c 6.7.  Check blood sugars before meals and at bedtime  -sugars low given sparce po intake  -increase levemir back to 20mg  qhs  -Patient also Glucophage 1000 mg BID PTA and resume as needed. CBG (last 3)   Recent Labs  04/11/16 2012 04/11/16 2344 04/12/16 0408  GLUCAP 192* 329* 275*    9.Hypertension with bouts of  orthostasis. Presently on Norvasc 5 mg daily. Patient also on lisinopril 40 mg daily as well as Accuretic prior to admission. Resume as needed 10 Hyperlipidemia .Zocor 40 mg daily 12. Spastic left hemiparesis, acute on chronic             -baclofen just resumed              -consider botox injections, discussed with husband who is comfortable with that             -continue splinting/ROM 13. Poor po intake/altered mentation  -NGT placed- ask dietary to eval  -continue TF at night---encourage PO during the  day  -anxiety/cognitive component most prominently      LOS (Days) 4 A FACE TO FACE EVALUATION WAS PERFORMED  Shelly Mcdaniel,Shelly Mcdaniel 04/12/2016 7:53 AM

## 2016-04-12 NOTE — Progress Notes (Signed)
Speech Language Pathology Daily Session Note  Patient Details  Name: Shelly Mcdaniel MRN: [id number] Date of Birth: Oct 17, 1955  Today's Date: 04/12/2016 SLP Individual Time: 1400-1445 SLP Individual Time Calculation (min): 45 min   Short Term Goals: Week 1: SLP Short Term Goal 1 (Week 1): Pt will sustain her attention to basic, familiar tasks for 1 minute intervals with mod assist multimodal cues for redirection.   SLP Short Term Goal 2 (Week 1): Pt will intiate and complete basic, familiar tasks with no more than 5 verbal cues for sequencing and organization.  SLP Short Term Goal 3 (Week 1): Pt will utilize external aids to orient to place, date, and situation with mod assist verbal and visual cues.   SLP Short Term Goal 4 (Week 1): Pt will utilize call bell to convey needs and wants to caregivers with max assist multimodal cues.  SLP Short Term Goal 5 (Week 1): Pt will consume regular textures and thin liquids with mod I use of swallowing precautions.   Skilled Therapeutic Interventions: Pt was seen for skilled ST targeting cognitive and dysphagia goals.  Pt was agreeable to getting out of bed at the beginning of today's therapy session and needed max assist multimodal cues for sequencing and initiation of transfer in addition to +2 assist from nurse tech.  Therapist utilized the [employer] once pt was seated upright in wheelchair to address goals for sustained attention to task and task initiation.  Pt needed max assist verbal cues for initiation during task and was able to sustain her attention for 15-30 second intervals with mod-max assist multimodal cues for redirection.  Therapist also completed skilled observations completed during a functional snack of regular textures and thin liquids.  Pt needed max assist verbal cues for attention to boluses to clear solids from oral cavity.  Pt was returned to room and left with call bell within reach and quick release belt donned.  Husband at bedside.   Continue per current plan of care.    Function:  Eating Eating   Modified Consistency Diet: No Eating Assist Level: Supervision or verbal cues;Set up assist for;Helper checks for pocketed food   Eating Set Up Assist For: Opening containers       Cognition Comprehension Comprehension assist level: Understands basic 75 - 89% of the time/ requires cueing 10 - 24% of the time  Expression   Expression assist level: Expresses basic 50 - 74% of the time/requires cueing 25 - 49% of the time. Needs to repeat parts of sentences.  Social Interaction Social Interaction assist level: Interacts appropriately 25 - 49% of time - Needs frequent redirection.  Problem Solving Problem solving assist level: Solves basic 25 - 49% of the time - needs direction more than half the time to initiate, plan or complete simple activities  Memory Memory assist level: Recognizes or recalls 25 - 49% of the time/requires cueing 50 - 75% of the time    Pain Pain Assessment Pain Assessment: No/denies pain  Therapy/Group: Individual Therapy  Alishba Naples, Melanee SpryNicole L 04/12/2016, 4:16 PM

## 2016-04-12 NOTE — Telephone Encounter (Signed)
If patients husband calls for an appt, please schedule before 05/23/2016 if possible thanks.  Rn spoke with patients husband about his wife needing an appt in [facility] to [facility]. PTs husband his wife will be in inpatient rehab for about 3 weeks. He will call back to r/s.

## 2016-04-13 ENCOUNTER — Inpatient Hospital Stay (HOSPITAL_COMMUNITY): Payer: BC Managed Care – PPO

## 2016-04-13 ENCOUNTER — Inpatient Hospital Stay (HOSPITAL_COMMUNITY): Payer: BC Managed Care – PPO | Admitting: Speech Pathology

## 2016-04-13 ENCOUNTER — Inpatient Hospital Stay (HOSPITAL_COMMUNITY): Payer: BC Managed Care – PPO | Admitting: Occupational Therapy

## 2016-04-13 LAB — GLUCOSE, CAPILLARY
Glucose-Capillary: 147 mg/dL — ABNORMAL HIGH (ref 65–99)
Glucose-Capillary: 308 mg/dL — ABNORMAL HIGH (ref 65–99)
Glucose-Capillary: 148 mg/dL — ABNORMAL HIGH (ref 65–99)
Glucose-Capillary: 230 mg/dL — ABNORMAL HIGH (ref 65–99)

## 2016-04-13 LAB — BASIC METABOLIC PANEL
Anion gap: 6 (ref 5–15)
BUN: 16 mg/dL (ref 6–20)
CO2: 28 mmol/L (ref 22–32)
Calcium: 9.2 mg/dL (ref 8.9–10.3)
Chloride: 107 mmol/L (ref 101–111)
Creatinine, Ser: 0.72 mg/dL (ref 0.44–1.00)
GFR calc Af Amer: >60 mL/min (ref >60)
GFR calc non Af Amer: >60 mL/min (ref >60)
Glucose, Bld: 339 mg/dL — ABNORMAL HIGH (ref 65–99)
Potassium: 3.9 mmol/L (ref 3.5–5.1)
Sodium: 141 mmol/L (ref 135–145)

## 2016-04-13 MED ORDER — LIDOCAINE 5 % EX PTCH
3.0000 | MEDICATED_PATCH | CUTANEOUS | Status: DC
  Administered 2016-04-13 – 2016-04-30 (×17): 3 via TRANSDERMAL
  Filled 2016-04-13 (×21): qty 3

## 2016-04-13 NOTE — Progress Notes (Signed)
Calorie Count Note  72-hour calorie count ordered.  Diet: Carb modified Supplements: Glucerna oral nutrition supplement TID (pt refusing)  04/12/16 Dinner: 114 kcal 5 grams protein  04/13/16 Breakfast: 371 kcal 19 grams protein  No meal ticket for Lunch.  Total Intake: 485 kcal (27% of minimum estimated needs) 24 grams protein (33% of minimum estimated needs)  Nutrition diagnosis: Inadequate oral intakerelated to lethargy/confusion, nauseaas evidenced by (No meal intake x 4 days); poor po; ongoing  Goal: Patient will meet greater than or equal to 90% of their needs; not met  Intervention: - Continue 72 hour calorie count. - Provide Glucerna Shake po TID, each supplement provides 220 kcal and 10 grams of protein. - Continue nocturnal tube feeds of Jevity 1.2 at goal rate of 86m/hr x 12 hours (5pm-5am)via Cortrak NGT with 345mProstat once daily toprovide 1360kcal (76% of needs), 69grams of protein (95% of protein needs), and 63867mf H2O.  - Continue free water flushes of 100 ml QID per tube.  KatJeb Leveringetetic Intern Pager Number: [phone]

## 2016-04-13 NOTE — Progress Notes (Signed)
Occupational Therapy Session Note  Patient Details  Name: Shelly Mcdaniel MRN: [id number] Date of Birth: 1955/08/18  Today's Date: 04/13/2016 OT Individual Time: 0900-1000 OT Individual Time Calculation (min): 60 min     Short Term Goals:Week 1:  OT Short Term Goal 1 (Week 1): Pt will initate and complete UB bathing with min assist and no more than mod instructional cueing.  OT Short Term Goal 2 (Week 1): Pt will perform LB bathing with mod assist sit to stand.   OT Short Term Goal 3 (Week 1): Pt will maintain static sitting balance EOB with supervison in preparation for selfcare tasks and functional transfers.   OT Short Term Goal 4 (Week 1): Pt will complete stand pivot transfer to 3:1 with mod assist 2 consecutive sessions.   OT Short Term Goal 5 (Week 1): Pt will tolerate 70 degrees AAROM at the left shoulder with pain less than 4/10.   Skilled Therapeutic Interventions/Progress Updates:    Pt seen for ADL retraining with a focus on sitting balance, sit to stand, attention to L side, hemi self care techniques.  Pt received in bed and excited to shower to today. Sat to EOB with facilitation through trunk and then through hips to scoot to edge. Squat pivot to w/c with mod A.  She was able to sit EOB with close S. Completed squat pivot to tub bench with use of grab bar and maintained balance on bench with close S.  Pt did extremely well with sit to stand using bar to allow therapist to wash backside. Encouraged pt to move her L arm with R arm and wash it herself to reduce pain during ADLs.  Pt tolerated her moving her arm herself well. Transferred back to chair for dressing. Improved hemidressing today as pt was attending to L side well.  Practiced sit to stand from w/c with R hand on bed rail. Maintains static stand with min A.  Provided pt with half lap tray to use for LUE. Pt's spouse arrived at end of session with all needs met.   Therapy Documentation Precautions:  Precautions Precautions:  Fall Precaution Comments: painful left arm with moderate left hemiparesis  Required Braces or Orthoses: Other Brace/Splint Other Brace/Splint: resting hand splint for the left hand at night Restrictions Weight Bearing Restrictions: No    Vital Signs: Therapy Vitals BP: (!) 154/90 Pain: Pain Assessment Pain Assessment: No/denies pain ADL:  See Function Navigator for Current Functional Status.   Therapy/Group: Individual Therapy  Latacha Texeira 04/13/2016, 12:32 PM

## 2016-04-13 NOTE — Progress Notes (Signed)
Speech Language Pathology Daily Session Note  Patient Details  Name: Shelly Mcdaniel MRN: [id number] Date of Birth: 03-22-1956  Today's Date: 04/13/2016 SLP Individual Time: 0800-0900 SLP Individual Time Calculation (min): 60 min   Short Term Goals: Week 1: SLP Short Term Goal 1 (Week 1): Pt will sustain her attention to basic, familiar tasks for 1 minute intervals with mod assist multimodal cues for redirection.   SLP Short Term Goal 2 (Week 1): Pt will intiate and complete basic, familiar tasks with no more than 5 verbal cues for sequencing and organization.  SLP Short Term Goal 3 (Week 1): Pt will utilize external aids to orient to place, date, and situation with mod assist verbal and visual cues.   SLP Short Term Goal 4 (Week 1): Pt will utilize call bell to convey needs and wants to caregivers with max assist multimodal cues.  SLP Short Term Goal 5 (Week 1): Pt will consume regular textures and thin liquids with mod I use of swallowing precautions.   Skilled Therapeutic Interventions:  Pt was seen for skilled ST targeting cognitive and dysphagia goals.  Pt was much more lucid today in comparison to yesterday's therapy session but needed mod assist verbal cues to reorient to place and time.  Pt consumed regular textures and thin liquids with supervision cues for use of swallowing precautions but needed frequent min assist verbal cues for redirection to task and task initiation as pt was noted to be both internally and externally distracted.  Pt also benefited from min assist verbal cues for task organization while setting up meal tray.  Pt was left in bed with call bell within reach with nursing present.  Continue per current plan of care.       Function:  Eating Eating   Modified Consistency Diet: No Eating Assist Level: Supervision or verbal cues;Set up assist for   Eating Set Up Assist For: Opening containers       Cognition Comprehension Comprehension assist level: Understands  basic 75 - 89% of the time/ requires cueing 10 - 24% of the time  Expression   Expression assist level: Expresses basic 75 - 89% of the time/requires cueing 10 - 24% of the time. Needs helper to occlude trach/needs to repeat words.  Social Interaction Social Interaction assist level: Interacts appropriately 50 - 74% of the time - May be physically or verbally inappropriate.  Problem Solving Problem solving assist level: Solves basic 50 - 74% of the time/requires cueing 25 - 49% of the time  Memory Memory assist level: Recognizes or recalls 50 - 74% of the time/requires cueing 25 - 49% of the time    Pain Pain Assessment Pain Assessment: No/denies pain   Therapy/Group: Individual Therapy  Kadija Cruzen, Melanee SpryNicole L 04/13/2016, 9:16 AM

## 2016-04-13 NOTE — Progress Notes (Signed)
PHYSICAL MEDICINE & REHABILITATION     PROGRESS NOTE    Subjective/Complaints: Left sided chest wall chronic pain since displaced rib fx x 4 yesterday Has been seeing pain management in [place], on Lidoderm and Lyrica Pt states she is very hungry this am Appreciate dietary note  ROS limited due to cognition  Objective: Vital Signs: Blood pressure (!) 155/90, pulse 97, temperature 99.3 F (37.4 C), temperature source Oral, resp. rate 20, height _0  (1.702 m), weight 74.8 kg (164 lb 14.5 oz), SpO2 99 %. Dg Abd 1 View  Result Date: 04/12/2016 CLINICAL DATA:  Tube placement EXAM: ABDOMEN - 1 VIEW COMPARISON:  04/09/2016 FINDINGS: Single view abdomen demonstrates esophageal tube tip to project over the third portion of duodenum, the tip is near the ligament of Treitz. Nonspecific gas pattern. Calcified pelvic phleboliths. IMPRESSION: Esophageal tube tip overlies third portion of duodenum and is near the ligament of Treitz. Electronically Signed   By: Donavan Foil M.D.   On: 04/12/2016 21:07    Recent Labs  04/10/16 0832  WBC 6.4  HGB 12.7  HCT 36.9  PLT 179    Recent Labs  04/10/16 0832 04/12/16 0506  NA 137 139  K 3.3* 3.2*  CL 106 107  GLUCOSE 328* 295*  BUN 18 14  CREATININE 0.62 0.50  CALCIUM 9.3 9.1   CBG (last 3)   Recent Labs  04/12/16 0810 04/12/16 1206 04/12/16 1640  GLUCAP 302* 139* 181*    Wt Readings from Last 3 Encounters:  04/13/16 74.8 kg (164 lb 14.5 oz)  04/05/16 73.3 kg (161 lb 9.6 oz)  01/15/16 75.2 kg (165 lb 12.8 oz)    Physical Exam:  Constitutional: lying comfortably in bed HENT:  Head: Normocephalic and atraumatic.  Right Ear: External ear normal.  Left Ear: External ear normal.  Mouth/Throat: Oropharynx is clear and moist.  Eyes: Conjunctivae and EOM are normal. Pupils are equal, round, and reactive to light. No scleral icterus.  Neck: No JVD present. No tracheal deviation present. No thyromegaly present.   Cardiovascular: Normal rate, regular rhythm and intact distal pulses.  Exam reveals no friction rub.   No murmur heard. Respiratory: Effort normal. No respiratory distress. She has no wheezes. She has no rales. She exhibits no tenderness.  GI: She exhibits no distension. There is no tenderness. There is no rebound.  Musculoskeletal: She exhibits no edema. Tenderness: left shoulder still tender with PROM. 1/2" sublux.No pain with RUE ROM, No tenderness in chest or in RUE, BLE, pain with Left shoulder palpation , as well as ROM LUENeurological: She is anxious/confused. She displays abnormal reflexes (LUE and LLE 3+). A cranial nerve deficits (left central 7 and tongue deviation.) is present. She exhibits abnormal muscle tone (MAS Left shoulder adductors 2-3/4. biceps 2/4, wrist/finger flexors 2/4. 1/4 knee flexors/plantrar flexors. 4-5 beats of clonus at left ankle--no change). Pain with attempted finger ext and wrist ext and elbow ext Coordination normal.  RUE and RLE 4 to 4+/5. LUE underlying 1/4 deltoid, biceps, triceps, wrist and finger flexors.  LLE 1+ HF, KE and 0/5 ADF/PF.--no change   Skin: No rash noted. No erythema. No pallor.  Psychiatric: She has a normal mood and affect.  Anxious/distracted  Assessment/Plan: 1. 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 Physiatrist is providing close team supervision and 24 hour management of active medical problems listed below. Physiatrist and rehab  team continue to assess barriers to discharge/monitor patient progress toward functional and medical goals.  Function:  Bathing Bathing position   Position: Wheelchair/chair at sink  Bathing parts Body parts bathed by patient: Chest, Abdomen, Right upper leg, Right lower leg Body parts bathed by helper: Back, Left lower leg, Left upper leg, Buttocks,  Front perineal area, Left arm, Right arm  Bathing assist Assist Level: Touching or steadying assistance(Pt > 75%)      Upper Body Dressing/Undressing Upper body dressing   What is the patient wearing?: Pull over shirt/dress     Pull over shirt/dress - Perfomed by patient: Thread/unthread right sleeve Pull over shirt/dress - Perfomed by helper: Thread/unthread left sleeve, Put head through opening, Pull shirt over trunk        Upper body assist        Lower Body Dressing/Undressing Lower body dressing   What is the patient wearing?: Pants, [employer], Shoes, Socks     Pants- Performed by patient: Thread/unthread right pants leg Pants- Performed by helper: Thread/unthread left pants leg, Pull pants up/down Non-skid slipper socks- Performed by patient: Don/doff right sock (doff) Non-skid slipper socks- Performed by helper: Don/doff right sock, Don/doff left sock     Shoes - Performed by patient: Don/doff right shoe Shoes - Performed by helper: Don/doff left shoe, Fasten right, Fasten left       TED Hose - Performed by helper: Don/doff right TED hose, Don/doff left TED hose  Lower body assist Assist for lower body dressing: Touching or steadying assistance (Pt > 75%)      Toileting Toileting Toileting activity did not occur: No continent bowel/bladder event   Toileting steps completed by helper: Adjust clothing prior to toileting, Performs perineal hygiene, Adjust clothing after toileting    Toileting assist Assist level: Two helpers   Transfers Chair/bed transfer   Chair/bed transfer method: Stand pivot Chair/bed transfer assist level: Maximal assist (Pt 25 - 49%/lift and lower) Chair/bed transfer assistive device: Armrests     Locomotion Ambulation     Max distance: 37f  Assist level: 2 helpers   Wheelchair   Type: Manual Max wheelchair distance: 1074f Assist Level: Dependent (Pt equals 0%)  Cognition Comprehension Comprehension assist level: Understands  basic 75 - 89% of the time/ requires cueing 10 - 24% of the time  Expression Expression assist level: Expresses basic 50 - 74% of the time/requires cueing 25 - 49% of the time. Needs to repeat parts of sentences.  Social Interaction Social Interaction assist level: Interacts appropriately 25 - 49% of time - Needs frequent redirection.  Problem Solving Problem solving assist level: Solves basic 25 - 49% of the time - needs direction more than half the time to initiate, plan or complete simple activities  Memory Memory assist level: Recognizes or recalls 25 - 49% of the time/requires cueing 50 - 75% of the time   Medical Problem List and Plan: 1.  Left spastic hemiplegia/dysarthria secondary to right pontine infarct with recent extension             -continue therapies today as tolerated, Team conference today please see physician documentation under team conference tab, met with team face-to-face to discuss problems,progress, and goals. Formulized individual treatment plan based on medical history, underlying problem and comorbidities. 2.  DVT Prophylaxis/Anticoagulation: Lovenox. Monitor for any bleeding episode 3. Pain Management with recent fall and persistent rib cage pain. Also has had prominent spasms/cramping at night  -Lidoderm patch increase to 3 patches Baclofen10 mg  3 times a day, Robaxin as needed.  -resumed lyrica (home medication) at 31m qhs (was using 75min am and 15024mn pm at home)--will cont 68m53mD Mentally more clear on current dose 4. Mood: Zoloft 50 mg daily, 5. Neuropsych: This patient is not capable of making decisions on her own behalf.   -HS confusion/altered sleep/wake cycle which has carried over from acute to admission here.  -increase melatonin to 6mg 65m (home medication)  -keep sleep chart  -need to re-establish sleep wake cycle  -all labs reviewed today  -ua/ucx negative 6. Skin/Wound Care: Routine skin checks 7. Fluids/Electrolytes/Nutrition:               -appreciate RD assistance with nutrition/TF  [facility]   -replace potassium. 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 Hemoglobin A1c 6.7.  Check blood sugars before meals and at bedtime  -sugars low given sparce po intake  -increase levemir 25U qhs  -Patient also Glucophage 1000 mg BID PTA, will start 868mg 101m11/1 CBG (last 3)   Recent Labs  04/12/16 0810 04/12/16 1206 04/12/16 1640  GLUCAP 302* 139* 181*    9.Hypertension with bouts of  orthostasis. Presently on Norvasc 5 mg daily. Patient also on lisinopril 40 mg daily as well as Accuretic prior to admission. Resume as needed 10 Hyperlipidemia .Zocor 40 mg daily 12. Spastic left hemiparesis, acute on chronic             -baclofen just resumed              -consider botox injections, discussed with husband who is comfortable with that             -continue splinting/ROM 13. Poor po intake/altered mentation  -NGT placed- TF at noc , enc po during the day  -continue TF at night---encourage PO during the day  -anxiety/cognitive component most prominently      LOS (Days) 5 A FACE TO FACE EVALUATION WAS PERFORMED  Ensley Blas E 04/13/2016 8:02 AM

## 2016-04-13 NOTE — Progress Notes (Signed)
Social Work Patient ID: Shelly Mcdaniel, female   DOB: March 16, 1956, 60 y.o.   MRN: [id number]  Met with pt and husband to discuss team conference goals min assist level and target discharge 11/17. Both are hopeful she will exceed these goals as she has with past goals. Husband is here daily and observes in therapies, made aware pt will need 24 hr care and not be able to be left alone At home. Will recommend OP follow up like she was attending after her first stroke. Both are hopeful she will consume enough to be able to get the NG tube discharged. It has been reinserted 2-3 times now. Will work with both on a safe discharge plan. Feel pt would benefit from seeing neuro-psych sometime during her stay.

## 2016-04-13 NOTE — Significant Event (Addendum)
Called by primary Cortrak staff Megan to replace feeding tube in [phone]. Patient was in therapy when RN arrived. Existing tube was marked at 25cm at the right nare.   Attempted placement using existing tube with a new stylus with patient sitting in chair-keeping getting coiling in stomach. Placed patient back in bed. Patient tolerated procedure. Feeding tube marked at 80cm. Will order KUB.

## 2016-04-13 NOTE — Progress Notes (Addendum)
Physical Therapy Note  Patient Details  Name: Shelly Mcdaniel MRN: [id number] Date of Birth: 01-18-1956 Today's Date: 04/13/2016  1445-1515, 30 min individual tx Pain: none  Without use of railing, bed mobility to R side of bed, with increased processing time and difficulty attending to task.  Wearing L AFO, gait in hallway using railing R hand, forwards and backwards, mod assist.  neuromuscular re-education via forced use, multimodal cues for: bil hip flex/extension in R sidelying,  reciprocal scooting in w/c for core activation, and L hamstring activation in supported standing.  Pt with minimal hamstring activation with inability to isolate for L knee flexion. Pt left resting in w/c with quick release belt applied and all needs within reach.  Pt's husband in room.Wanda Plump.  Ceferino Lang 04/13/2016, 3:07 PM

## 2016-04-13 NOTE — Patient Care Conference (Signed)
Inpatient RehabilitationTeam Conference and Plan of Care Update Date: 04/13/2016   Time: 11:00 AM    Patient Name: Shelly Mcdaniel      Medical Record Number: [id number]  Date of Birth: 27-Sep-1955 Sex: Female         Room/Bed: 4W15C/4W15C-01 Payor Info: Payor: [employer]UE [employer] / [facility] / Product Type: *No Product type* /    Admitting Diagnosis: CVA  Admit Date/Time:  04/08/2016  5:54 PM Admission Comments: No comment available   Primary Diagnosis:  Right pontine cerebrovascular accident [facility](HCC) Principal Problem: Right pontine cerebrovascular accident [facility](HCC)  Patient Active Problem List   Diagnosis Date Noted  . Orthostatic hypotension 04/08/2016  . Muscle cramps 04/08/2016  . 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 Cerebral thrombosis with cerebral infarction 04/06/2016  . Basilar artery stenosis   . Dysarthria 04/05/2016  . History of CVA with residual deficit 04/05/2016  . Sinus tachycardia 04/05/2016  . Acute encephalopathy 04/05/2016  . Chronic pain 04/05/2016  . Hyperlipidemia 04/05/2016  . Ischemic stroke (HCC)   . 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 TIA (transient ischemic attack) 11/07/2014  . Uncontrolled hypertension 11/07/2014  . DM II (diabetes mellitus, type II), controlled (HCC) 11/07/2014    Expected Discharge Date: Expected Discharge Date: 04/29/16  Team Members Present: Physician leading conference: Dr. Claudette LawsAndrew Kirsteins Social Worker Present: Dossie DerBecky Dessie Tatem, LCSW Nurse Present: Carmie EndAngie Joyce, RN PT Present: Wanda Plumparoline Cook, Antonietta JewelPT;Austin Tucker, PT OT Present: Perrin MalteseJames McGuire, OT SLP Present: Jackalyn LombardNicole Page, SLP PPS Coordinator present : Tora DuckMarie Noel, RN, CRRN     Current Status/Progress Goal Weekly Team Focus  Medical   Intact overall improving, level of alertness improved but orientation is poor, severe spasticity, LUE  Improve oral intake  spasticity management    Bowel/Bladder   incontinent bladder checking pvr's in and out caths required every 6-8 hours no void LBM 10-29 will give suppository this pm  managed total assist by family  monitor urinary retention   Swallow/Nutrition/ Hydration   regular, thin liquids; full supervision due to cognition   mod I   decrease to intermittent supervision per improved mentation    ADL's   max A overall for bathing at the sink and dressing, functional transfers mod to max A, standing balance mod to max, working on sustained attention   min A overall (transfers, bathign and dressing ), mod A for functional use of left UE  sustained to selective attention, attention to the left, functional use of left UE, transfer training , NMR   Mobility   mod assist squat pivot basic transfer; mod assist gait x 15' with hand rail  min assist basic and car transfers, min assist gait x 75' controlled, 40' home; min assist w/c x 100' controlled, 40' home  alertness, pt and family ed, balance, midline orientation, mobility, locomotion   Communication   impacted by cognition, see below         Safety/Cognition/ Behavioral Observations  moderate-severe cognitive impairments, fluctuates greatly within therapy sessions  min assist   task and verbal initiation, organization, sustained attention to task, orientation   Pain   left shoulder pain tylenol 650 mg po heaches occassionally  less than 2  monitor pain level   Skin   WNL            *See Care Plan and progress notes for long and short-term goals.  Barriers to Discharge: severe pain LUE, unable  to take adequate calories via po route    Possible Resolutions to Barriers:  see above cont rehab efforts    Discharge Planning/Teaching Needs:  Home with husband who can provide physical care-was assisting after first stroke. He has been here and observed pt in therapies.      Team Discussion:  Goals overall min assist level. NG tube replaced today-fell out. PO intake is  improving and hopefully can get NG out soon. Chronic rib pain on left side-MD managing this. Shoulder pain MD to inject later this week. Flucutates with confusion and being oriented. Husband here daily to attend therapies with pt.  Revisions to Treatment Plan:  None   Continued Need for Acute Rehabilitation Level of Care: The patient requires daily medical management by a physician with specialized training in physical medicine and rehabilitation for the following conditions: Daily direction of a multidisciplinary physical rehabilitation program to ensure safe treatment while eliciting the highest outcome that is of practical value to the patient.: Yes Daily analysis of laboratory values and/or radiology reports with any subsequent need for medication adjustment of medical intervention for : Neurological problems;Nutritional problems;Mood/behavior problems  Lucy ChrisDupree, Maxemiliano Riel G 04/14/2016, 8:34 AM

## 2016-04-13 NOTE — Progress Notes (Signed)
Physical Therapy Session Note  Patient Details  Name: Shelly SimmerJody D Featherston MRN: [id number] Date of Birth: [birth year]  Today's Date: 04/13/2016 PT Individual Time: 1102-1140 PT Individual Time Calculation (min): 38 min    Short Term Goals: Week 1:  PT Short Term Goal 1 (Week 1): Patient will consistently perform sit<>stand with Mod Assist and RLAD PT Short Term Goal 2 (Week 1): Patient will perform stand pivot/ squat pivot with mod assist consistently.  PT Short Term Goal 3 (Week 1): Patient will ambulate 5030ft with max Assist and LRAD  PT Short Term Goal 4 (Week 1): Patient will perform all bed mobility with mod assist from PT.  PT Short Term Goal 5 (Week 1): Patient will initiate WC mobility training.   Skilled Therapeutic Interventions/Progress Updates:     Patient received supine in bed asleep. Aroused easily and agreeable to PT.  PT assisted patient to don Ridged AFO with total Assist for time management. Bed mobility with min-mod Assist for LLE control and to support trunk during supine to sit. Patient noted to require heavy use of rails and fall to L in sitting position without cues from PT for UE support. Sitting balance EOB with min assist progressing to supervision assist with min-mod cues for improved pelvic alignment and posture. Squat pivot transfer with mod assist from PT with AFO in place. Min-mod cues for proper UE placement and BLE positioning.   Patient performed WC mobility for 15150ft with R hemi technique and min Assist from PT. Constant visual and verbal cues for improved attention to L side to prevent hitting obstacles or wall.   PT instructed patient in stand pivot transfer to and from mat table with Mod Assist from PT. Moderate cues from PT proper UE placement use of UE support on therapist to encourage improved trunk extension. Patient noted to initiate lateral stepping the LLE for stand pivot transfer to the L following improved trunk extension  Neuromuscular  re-ed: Sit<>stand x 4 from mat table with mod assist from PT for anterior weight shift. Standing balance up to 30 seconds x 3 with lateral weight shifting L and R. Moderate assist from PT for balance and improved pelvic mobility to improve WB through the LLE.  Lateral reaching to the R while sitting in balance disc 3 sets of 5; cross body reaches with the R on last set. Manual facilitation of R sided trunk elongation and L sided trunk closure to activate deep core musculature.   Patient transported to room and performed stand pivot transfer to Bed with mod assist with facilitation as listed above. Min Assist for sit>supine with manual facilitation of the LLE. Patient left supine in bed for placement of NG tube with RN present.   Therapy Documentation Precautions:  Precautions Precautions: Fall Precaution Comments: painful left arm with moderate left hemiparesis  Required Braces or Orthoses: Other Brace/Splint Other Brace/Splint: resting hand splint for the left hand at night Restrictions Weight Bearing Restrictions: No General: PT Amount of Missed Time (min): 22 Minutes PT Missed Treatment Reason: Nursing care Vital Signs:  Pain: Pain Assessment Pain Assessment: No/denies pain  See Function Navigator for Current Functional Status.   Therapy/Group: Individual Therapy  Golden Popustin E Evonne Rinks 04/13/2016, 1:12 PM

## 2016-04-13 NOTE — Progress Notes (Signed)
Cortrak tube noted further out of her nose than when placed on 04-02-16. Cortrak team notified and will reassess. Tube feed stopped at 0710. Dr. Wynn BankerKirsteins aware. Continue with plan of care. Cleotilde NeerJoyce, Castor Gittleman S

## 2016-04-14 ENCOUNTER — Inpatient Hospital Stay (HOSPITAL_COMMUNITY): Payer: BC Managed Care – PPO | Admitting: Occupational Therapy

## 2016-04-14 ENCOUNTER — Encounter: Payer: BC Managed Care – PPO | Admitting: Occupational Therapy

## 2016-04-14 ENCOUNTER — Ambulatory Visit: Payer: BC Managed Care – PPO | Admitting: Rehabilitation

## 2016-04-14 ENCOUNTER — Inpatient Hospital Stay (HOSPITAL_COMMUNITY): Payer: BC Managed Care – PPO | Admitting: Speech Pathology

## 2016-04-14 ENCOUNTER — Inpatient Hospital Stay (HOSPITAL_COMMUNITY): Payer: BC Managed Care – PPO | Admitting: Physical Therapy

## 2016-04-14 ENCOUNTER — Inpatient Hospital Stay (HOSPITAL_COMMUNITY): Payer: BC Managed Care – PPO

## 2016-04-14 ENCOUNTER — Ambulatory Visit: Payer: BC Managed Care – PPO | Admitting: *Deleted

## 2016-04-14 LAB — GLUCOSE, CAPILLARY
GLUCOSE-CAPILLARY: 113 mg/dL — AB (ref 65–99)
GLUCOSE-CAPILLARY: 212 mg/dL — AB (ref 65–99)
GLUCOSE-CAPILLARY: 274 mg/dL — AB (ref 65–99)
GLUCOSE-CAPILLARY: 206 mg/dL — AB (ref 65–99)
Glucose-Capillary: 215 mg/dL — ABNORMAL HIGH (ref 65–99)

## 2016-04-14 MED ORDER — METFORMIN HCL 850 MG PO TABS
850.0000 mg | ORAL_TABLET | Freq: Two times a day (BID) | ORAL | Status: DC
  Administered 2016-04-14 – 2016-05-01 (×33): 850 mg via ORAL
  Filled 2016-04-14 (×37): qty 1

## 2016-04-14 MED ORDER — LIDOCAINE VISCOUS 2 % MT SOLN
15.0000 mL | Freq: Once | OROMUCOSAL | Status: AC
  Administered 2016-04-14: 2 mL via OROMUCOSAL
  Filled 2016-04-14: qty 15

## 2016-04-14 MED ORDER — IOPAMIDOL (ISOVUE-300) INJECTION 61%
INTRAVENOUS | Status: AC
  Administered 2016-04-14: 10 mL
  Filled 2016-04-14: qty 50

## 2016-04-14 MED ORDER — LIDOCAINE VISCOUS 2 % MT SOLN
OROMUCOSAL | Status: AC
  Administered 2016-04-14: 2 mL via OROMUCOSAL
  Filled 2016-04-14: qty 15

## 2016-04-14 MED ORDER — IOPAMIDOL (ISOVUE-300) INJECTION 61%
50.0000 mL | Freq: Once | INTRAVENOUS | Status: AC | PRN
  Administered 2016-04-14: 10 mL

## 2016-04-14 MED ORDER — METFORMIN HCL 850 MG PO TABS: 850.0000 mg | ORAL_TABLET | Freq: Every day | ORAL | Status: DC

## 2016-04-14 NOTE — Progress Notes (Signed)
[name] PA notified of Pt pulling NGT out. Leave out until able to notify Cortrak to come replace. Pt. Voided at 0545. SN and NT provides pericare. Pt. C/o pain to back. Medicate with Tylenol 650 mg. Po with vanilla pudding. Continue to monitor.

## 2016-04-14 NOTE — Progress Notes (Signed)
[facility] PHYSICAL MEDICINE & REHABILITATION     PROGRESS NOTE    Subjective/Complaints: Pt pulled out tube again Appreciate dietary note, intake ~33% of needs  ROS limited due to cognition  Objective: Vital Signs: Blood pressure (!) 169/104, pulse 97, temperature 97.7 F (36.5 C), temperature source Oral, resp. rate 20, height 5\' 7"  (1.702 m), weight 76.3 kg (168 lb 3.4 oz), SpO2 96 %. Dg Abd 1 View  Result Date: 04/12/2016 CLINICAL DATA:  Tube placement EXAM: ABDOMEN - 1 VIEW COMPARISON:  04/09/2016 FINDINGS: Single view abdomen demonstrates esophageal tube tip to project over the third portion of duodenum, the tip is near the ligament of Treitz. Nonspecific gas pattern. Calcified pelvic phleboliths. IMPRESSION: Esophageal tube tip overlies third portion of duodenum and is near the ligament of Treitz. Electronically Signed   By: Jasmine PangKim  Fujinaga M.D.   On: 04/12/2016 21:07   Dg Abd Portable 1v  Result Date: 04/13/2016 CLINICAL DATA:  Recheck of feeding tube since replacement and repositioning. EXAM: PORTABLE ABDOMEN - 1 VIEW COMPARISON:  KUB of April 12, 2016 FINDINGS: The radiodense feeding tube tip lies in the region of the third portion of the duodenum. Previously the tube tip was in the fourth portion of the duodenum. The bowel gas pattern is unremarkable. IMPRESSION: Reasonable positioning of the feeding tube with the tip projecting in the third portion of the duodenum. Electronically Signed   By: David  SwazilandJordan M.D.   On: 04/13/2016 16:12   No results for input(s): WBC, HGB, HCT, PLT in the last 72 hours.  Recent Labs  04/12/16 0506 04/13/16 0854  NA 139 141  K 3.2* 3.9  CL 107 107  GLUCOSE 295* 339*  BUN 14 16  CREATININE 0.50 0.72  CALCIUM 9.1 9.2   CBG (last 3)   Recent Labs  04/13/16 1702 04/13/16 2025 04/14/16 0701  GLUCAP 148* 230* 212*    Wt Readings from Last 3 Encounters:  04/14/16 76.3 kg (168 lb 3.4 oz)  04/05/16 73.3 kg (161 lb 9.6 oz)  01/15/16  75.2 kg (165 lb 12.8 oz)    Physical Exam:  Constitutional: lying comfortably in bed HENT:  Head: Normocephalic and atraumatic.  Right Ear: External ear normal.  Left Ear: External ear normal.  Mouth/Throat: Oropharynx is clear and moist.  Eyes: Conjunctivae and EOM are normal. Pupils are equal, round, and reactive to light. No scleral icterus.  Neck: No JVD present. No tracheal deviation present. No thyromegaly present.  Cardiovascular: Normal rate, regular rhythm and intact distal pulses.  Exam reveals no friction rub.   No murmur heard. Respiratory: Effort normal. No respiratory distress. She has no wheezes. She has no rales. She exhibits no tenderness.  GI: She exhibits no distension. There is no tenderness. There is no rebound.  Musculoskeletal: She exhibits no edema. Tenderness: left shoulder still tender with PROM. 1/2" sublux.No pain with RUE ROM, No tenderness in chest or in RUE, BLE, pain with Left shoulder palpation , as well as ROM LUENeurological: She is anxious/confused. She displays abnormal reflexes (LUE and LLE 3+). A cranial nerve deficits (left central 7 and tongue deviation.) is present. She exhibits abnormal muscle tone (MAS Left shoulder adductors 2-3/4. biceps 2/4, wrist/finger flexors 2/4. 1/4 knee flexors/plantrar flexors. 4-5 beats of clonus at left ankle--no change). Pain with attempted finger ext and wrist ext and elbow ext Coordination normal.  RUE and RLE 4 to 4+/5. LUE underlying 1/4 deltoid, biceps, triceps, wrist and finger flexors.  LLE 1+ HF, KE  and 0/5 ADF/PF.--no change   Skin: No rash noted. No erythema. No pallor.  Psychiatric: She has a normal mood and affect.  Anxious/distracted  Assessment/Plan: 1. 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 Physiatrist is providing close team supervision and 24 hour  management of active medical problems listed below. Physiatrist and rehab team continue to assess barriers to discharge/monitor patient progress toward functional and medical goals.  Function:  Bathing Bathing position   Position: Shower  Bathing parts Body parts bathed by patient: Chest, Abdomen, Right upper leg, Left arm, Front perineal area, Left upper leg Body parts bathed by helper: Right lower leg, Left lower leg, Back, Right arm, Buttocks  Bathing assist Assist Level: Touching or steadying assistance(Pt > 75%)      Upper Body Dressing/Undressing Upper body dressing   What is the patient wearing?: Pull over shirt/dress     Pull over shirt/dress - Perfomed by patient: Thread/unthread right sleeve, Put head through opening, Pull shirt over trunk Pull over shirt/dress - Perfomed by helper: Thread/unthread left sleeve        Upper body assist        Lower Body Dressing/Undressing Lower body dressing   What is the patient wearing?: Pants, Ted Hose, Socks     Pants- Performed by patient: Thread/unthread right pants leg Pants- Performed by helper: Thread/unthread left pants leg, Pull pants up/down Non-skid slipper socks- Performed by patient: Don/doff right sock (doff) Non-skid slipper socks- Performed by helper: Don/doff right sock, Don/doff left sock     Shoes - Performed by patient: Don/doff right shoe Shoes - Performed by helper: Don/doff left shoe, Fasten right, Fasten left       TED Hose - Performed by helper: Don/doff right TED hose, Don/doff left TED hose  Lower body assist Assist for lower body dressing: Touching or steadying assistance (Pt > 75%)      Toileting Toileting Toileting activity did not occur: No continent bowel/bladder event   Toileting steps completed by helper: Adjust clothing prior to toileting, Performs perineal hygiene, Adjust clothing after toileting    Toileting assist Assist level: Two helpers   Transfers Chair/bed transfer    Chair/bed transfer method: Stand pivot Chair/bed transfer assist level: Moderate assist (Pt 50 - 74%/lift or lower) Chair/bed transfer assistive device: Armrests, Patent attorney     Max distance: 10 Assist level: Moderate assist (Pt 50 - 74%)   Wheelchair   Type: Manual Max wheelchair distance: 179ft  Assist Level: Touching or steadying assistance (Pt > 75%)  Cognition Comprehension Comprehension assist level: Understands basic 75 - 89% of the time/ requires cueing 10 - 24% of the time  Expression Expression assist level: Expresses basic 75 - 89% of the time/requires cueing 10 - 24% of the time. Needs helper to occlude trach/needs to repeat words.  Social Interaction Social Interaction assist level: Interacts appropriately 50 - 74% of the time - May be physically or verbally inappropriate.  Problem Solving Problem solving assist level: Solves basic 25 - 49% of the time - needs direction more than half the time to initiate, plan or complete simple activities  Memory Memory assist level: Recognizes or recalls 50 - 74% of the time/requires cueing 25 - 49% of the time   Medical Problem List and Plan: 1.  Left spastic hemiplegia/dysarthria secondary to right pontine infarct with recent extension             -  continue therapies today as tolerated, 2.  DVT Prophylaxis/Anticoagulation: Lovenox. Monitor for any bleeding episode 3. Pain Management with recent fall and persistent rib cage pain. Also has had prominent spasms/cramping at night  -Lidoderm patch increase to 3 patches Baclofen10 mg 3 times a day, Robaxin as needed.  -resumed lyrica (home medication) at 75mg  qhs (was using 75mg  in am and 150mg  in pm at home)--will cont 50mg  BID Mentally more clear on current dose 4. Mood: Zoloft 50 mg daily, 5. Neuropsych: This patient is not capable of making decisions on her own behalf.   -HS confusion/altered sleep/wake cycle which has carried over from acute to admission  here.  -increase melatonin to 6mg  QHS (home medication)  -keep sleep chart  -need to re-establish sleep wake cycle  -all labs reviewed today  -ua/ucx negative 6. Skin/Wound Care: Routine skin checks 7. 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 Hemoglobin A1c 6.7.  Check blood sugars before meals and at bedtime  -sugars low given sparce po intake  -increase levemir 25U qhs  -Patient also Glucophage 1000 mg BID PTA, will start 850mg  BID 11/2 CBG (last 3)   Recent Labs  04/13/16 1702 04/13/16 2025 04/14/16 0701  GLUCAP 148* 230* 212*    9.Hypertension with bouts of  orthostasis. Presently on Norvasc 5 mg daily. Patient also on lisinopril 40 mg daily as well as Accuretic prior to admission. Resume as needed 10 Hyperlipidemia .Zocor 40 mg daily 12. Spastic left hemiparesis, acute on chronic             -baclofen just resumed , Elbow flexor spasticity better, still has pain with PROM FFs             -consider botox injections, discussed with husband who is comfortable with that             -continue splinting/ROM 13. Poor po intake/altered mentation  -NGT placed- TF at noc , enc po during the day  -continue TF at night---encourage PO during the day, keeps pulling out feeding tube, pt states she is not aware  -anxiety/cognitive component most prominently      LOS (Days) 6 A FACE TO FACE EVALUATION WAS PERFORMED  Erick ColaceKIRSTEINS,Finnick Orosz E 04/14/2016 7:43 AM

## 2016-04-14 NOTE — Progress Notes (Signed)
Speech Language Pathology Daily Session Note  Patient Details  Name: Wendie SimmerJody D Eble MRN: [id number] Date of Birth: 1955-10-10  Today's Date: 04/14/2016 SLP Individual Time: [id number] SLP Individual Time Calculation (min): 60 min   Short Term Goals: Week 1: SLP Short Term Goal 1 (Week 1): Pt will sustain her attention to basic, familiar tasks for 1 minute intervals with mod assist multimodal cues for redirection.   SLP Short Term Goal 2 (Week 1): Pt will intiate and complete basic, familiar tasks with no more than 5 verbal cues for sequencing and organization.  SLP Short Term Goal 3 (Week 1): Pt will utilize external aids to orient to place, date, and situation with mod assist verbal and visual cues.   SLP Short Term Goal 4 (Week 1): Pt will utilize call bell to convey needs and wants to caregivers with max assist multimodal cues.  SLP Short Term Goal 5 (Week 1): Pt will consume regular textures and thin liquids with mod I use of swallowing precautions.   Skilled Therapeutic Interventions: Pt was seen for skilled ST targeting cognitive and dysphagia goals.  Pt was asleep upon arrival and needed max tactile cues and noxious stimulation to achieve and maintain alertness.  During lunch meal, pt needed hand over hand assist to feed herself due to fleeting sustained attention to task and almost no appreciable task initiation.  Once food was in mouth, pt needed mod-max assist verbal cues to manipulate and clear materials from the oral cavity due to decreased attention to boluses.  No overt s/s of aspiration were evident with solids or liquids.  Pt continues to vary significantly between therapy sessions with slow progress towards long term goals.  Pt was left in bed with bed alarm set and call bell within reach.  Discussed current goals and progress in therapy with husband at the end of today's therapy session.    Function:  Eating Eating                 Cognition Comprehension Comprehension  assist level: Understands basic 25 - 49% of the time/ requires cueing 50 - 75% of the time  Expression   Expression assist level: Expresses basic 50 - 74% of the time/requires cueing 25 - 49% of the time. Needs to repeat parts of sentences.  Social Interaction Social Interaction assist level: Interacts appropriately 25 - 49% of time - Needs frequent redirection.  Problem Solving Problem solving assist level: Solves basic 25 - 49% of the time - needs direction more than half the time to initiate, plan or complete simple activities  Memory Memory assist level: Recognizes or recalls less than 25% of the time/requires cueing greater than 75% of the time    Pain Pain Assessment Pain Assessment: No/denies pain  Therapy/Group: Individual Therapy  Alverta Caccamo, Melanee SpryNicole L 04/14/2016, 2:18 PM

## 2016-04-14 NOTE — Plan of Care (Signed)
Pt's plan of care adjusted to 15 hours over 7 days after speaking with care team and discussed with MD in team conference as pt currently unable to tolerate current therapy schedule with OT, PT, and SLP.  

## 2016-04-14 NOTE — Progress Notes (Signed)
Occupational Therapy Session Note  Patient Details  Name: Shelly Mcdaniel MRN: [id number] Date of Birth: [birth year]  Today's Date: 04/14/2016 OT Individual Time: 0902-1002 OT Individual Time Calculation (min): 60 min     Short Term Goals: Week 1:  OT Short Term Goal 1 (Week 1): Pt will initate and complete UB bathing with min assist and no more than mod instructional cueing.  OT Short Term Goal 2 (Week 1): Pt will perform LB bathing with mod assist sit to stand.   OT Short Term Goal 3 (Week 1): Pt will maintain static sitting balance EOB with supervison in preparation for selfcare tasks and functional transfers.   OT Short Term Goal 4 (Week 1): Pt will complete stand pivot transfer to 3:1 with mod assist 2 consecutive sessions.   OT Short Term Goal 5 (Week 1): Pt will tolerate 70 degrees AAROM at the left shoulder with pain less than 4/10.   Skilled Therapeutic Interventions/Progress Updates:    Pt worked on bathing and dressing during session.  She was oriented to place and situation as well as month.  She reported more difficulty with her "proprioception" from this stroke as well as her swallowing.  Transferred to the tub bench with max assist after therapist provided total assist for removal of dirty clothing.  Max demonstrational cueing to initiate and complete bathing with neglect of the left side noted.  Pt with decreased ability reaching and grasping washcloth when presented at midline with the non-involved hand secondary to attention.  Only able to follow 25% of one step commands this session.  Mod assist for sit to stand and for standing balance when therapist assisted with washing peri area or pulling brief and pants over hips.  Noted pt at times with head down and not attending to task at all.  Max instructional cueing for sustained attention.  Total assist for all dressing secondary to decreased initiation as well.  Pt nauseas at end of session, noted dry heaving in basin.  Safety belt  in place and call button in reach.  Nursing notified for replacement of pain patches and for current nausea.   Therapy Documentation Precautions:  Precautions Precautions: Fall Precaution Comments: painful left arm with moderate left hemiparesis  Required Braces or Orthoses: Other Brace/Splint Other Brace/Splint: resting hand splint for the left hand at night Restrictions Weight Bearing Restrictions: No  Pain: Pain Assessment Pain Assessment: No/denies pain ADL: See Function Navigator for Current Functional Status.   Therapy/Group: Individual Therapy  Mosella Kasa OTR/L 04/14/2016, 12:30 PM

## 2016-04-14 NOTE — Progress Notes (Signed)
Day 3 of 3: Calorie Count Note  72 hour calorie count ordered.  Diet: Carbohydrate modified Supplements: Glucerna Shake po TID, each supplement provides 220 kcal and 10 grams of protein (pt refused)  Breakfast: 29 kcal, 2 grams of protein Lunch: 94 kcal, 3 grams of protein Dinner: 188 kcal, 17 grams of protein Supplements: none  Total intake: 311 kcal (17% of minimum estimated needs)  22 grams of protein (30% of minimum estimated needs)  Pt continues to have poor po intake at meals. Recommend continuation of nocturnal tube feeds to provide adequate nutrition needs.   Estimated Nutritional Needs:  Kcal:  1800-2050 Protein:  73-88g  Fluid:  1.8-2 Liters  Nutrition Dx:  Inadequate oral intakerelated to lethargy/confusion, nauseaas evidenced by (No meal intake x 4 days); poor po; ongoing  Goal:  Pt to meet >/= 90% of their estimated nutrition needs; only met via TF  Intervention:  -Discontinue calorie count. -Provide Glucerna Shake po TID, each supplement provides 220 kcal and 10 grams of protein. - Continue nocturnal tube feeds of Jevity 1.2 at goal rate of 58m/hr x 12 hours (5pm-5am)via Cortrak NGT with 342mProstat once daily toprovide 1360kcal (76% of needs), 69grams of protein (95% of protein needs), and 63822mf H2O.  - Continue free water flushes of 100 ml QID per tube. -Encourage adequate PO intake.   SteCorrin ParkerS, RD, LDN Pager # [phone] hours/ weekend pager # [phone]

## 2016-04-14 NOTE — Progress Notes (Signed)
Physical Therapy Session Note  Patient Details  Name: Shelly Mcdaniel MRN: [id number] Date of Birth: 02/07/1956  Today's Date: 04/14/2016 PT Individual Time: 0801-0904 PT Individual Time Calculation (min): 63 min    Short Term Goals: Week 1:  PT Short Term Goal 1 (Week 1): Patient will consistently perform sit<>stand with Mod Assist and RLAD PT Short Term Goal 2 (Week 1): Patient will perform stand pivot/ squat pivot with mod assist consistently.  PT Short Term Goal 3 (Week 1): Patient will ambulate 2430ft with max Assist and LRAD  PT Short Term Goal 4 (Week 1): Patient will perform all bed mobility with mod assist from PT.  PT Short Term Goal 5 (Week 1): Patient will initiate WC mobility training.   Skilled Therapeutic Interventions/Progress Updates:     Pt received sitting EOB with NT present for assist for eating. PT instructed patient [facility] assist and L knee blocked. Mod cues for sequencing and initiation. Patient expressed confusion for need to be in hospital and desired to go home. PT able to re-orient patient to situation and location with with moderate cues.  Patient able to perform self perineal care with supervision assist and mod cues for initiation.  Stand pivot transfer back to bed with max assist to L side. PT assisted patient in donning pants, brief, and shirt with hand over hand assist with moderate cues for dressing L side first. Sit<>stand technique to bring pants to waist with max assist to maintain standing balance.  Stand pivot transfer to [facility] L side with mod Assist to facilitate improved upright posture and HS/gluteal activation for lateral/posterior stepping with the LLE.   WC mobility x 16600ft with supervision-min Assist from PT with moderate cues for attention to left side to prevent hitting people in hall.   PT fit patient for ridged back WC for improved postural control and alignment. Patient noted to sit in reduced posterior pelvic  tilt in 18x18 hard back WC.   Standing tolerance with Moderate assist from PT to improve equal weight bearing without AFO and improved orientation to midline. Sitting postural control for 4 sets x 2 minutes each with mirror visual feedback to maintain neutral alignment to prevent R lateral lean.   Patient was noted to have one episode of nausea and reports need to vomit, but no emesis noted with dry heave; RN made aware.   Patient returned to room and left sitting [facility] with call bell in reach.   Therapy Documentation Precautions:  Precautions Precautions: Fall Precaution Comments: painful left arm with moderate left hemiparesis  Required Braces or Orthoses: Other Brace/Splint Other Brace/Splint: resting hand splint for the left hand at night Restrictions Weight Bearing Restrictions: No Pain: Pain Assessment Pain Assessment: Faces Faces Pain Scale: Hurts a little bit Pain Type: Chronic pain Pain Location: Shoulder Pain Orientation: Left Pain Descriptors / Indicators: Aching Pain Onset: On-going   See Function Navigator for Current Functional Status.   Therapy/Group: Individual Therapy  Golden Popustin E Ardyce Heyer 04/14/2016, 9:21 AM

## 2016-04-14 NOTE — Progress Notes (Signed)
Observe NGT out and Jevity flowing on to floor. Last check on pt. Done at 0115 w/o any complications. Pump stopped at this time and Pt voiding. SN and NT to provide pericare and change bottom sheet and pad. Bladder scan results 113 post void. Charge nurse notified. SN to contact [name] PA in am when he comes to the floor.

## 2016-04-14 NOTE — Progress Notes (Signed)
HSBG 230 5 units Novolog insulin given per order. Voided in brief pericare provided with complete bed change including Pt. Gown per SN and Nt. Apply Lidoderm patch to Lt. Ribs, Lt shoulder, and lt. Lower back.  Jevity continues infusing per NGT w/o difficulty at 70 cc/hr. Including h20 boluses per order. Pt. Resting quietly during night w/o complaints. Continue to monitor.

## 2016-04-14 NOTE — Progress Notes (Signed)
Occupational Therapy Note  Patient Details  Name: Shelly SimmerJody D Stick MRN: [id number] Date of Birth: 03/10/1956  Pt missed 45 mins OT secondary to being gone for replacement of NG tube.      Nylene Inlow OTR/L 04/14/2016, 3:30 PM

## 2016-04-15 ENCOUNTER — Inpatient Hospital Stay (HOSPITAL_COMMUNITY): Payer: BC Managed Care – PPO | Admitting: Occupational Therapy

## 2016-04-15 ENCOUNTER — Inpatient Hospital Stay (HOSPITAL_COMMUNITY): Payer: BC Managed Care – PPO | Admitting: Speech Pathology

## 2016-04-15 ENCOUNTER — Inpatient Hospital Stay (HOSPITAL_COMMUNITY): Payer: BC Managed Care – PPO | Admitting: Physical Therapy

## 2016-04-15 ENCOUNTER — Inpatient Hospital Stay (HOSPITAL_COMMUNITY): Payer: BC Managed Care – PPO

## 2016-04-15 ENCOUNTER — Ambulatory Visit: Payer: BC Managed Care – PPO | Admitting: Neurology

## 2016-04-15 LAB — GLUCOSE, CAPILLARY
GLUCOSE-CAPILLARY: 179 mg/dL — AB (ref 65–99)
GLUCOSE-CAPILLARY: 198 mg/dL — AB (ref 65–99)
GLUCOSE-CAPILLARY: 241 mg/dL — AB (ref 65–99)
Glucose-Capillary: 119 mg/dL — ABNORMAL HIGH (ref 65–99)
Glucose-Capillary: 150 mg/dL — ABNORMAL HIGH (ref 65–99)
Glucose-Capillary: 251 mg/dL — ABNORMAL HIGH (ref 65–99)

## 2016-04-15 MED ORDER — SODIUM BICARBONATE 650 MG PO TABS
650.0000 mg | ORAL_TABLET | Freq: Once | ORAL | Status: AC
  Administered 2016-04-15: 650 mg via ORAL
  Filled 2016-04-15: qty 1

## 2016-04-15 MED ORDER — PANCRELIPASE (LIP-PROT-AMYL) 12000-38000 UNITS PO CPEP
12000.0000 [IU] | ORAL_CAPSULE | Freq: Once | ORAL | Status: AC
  Administered 2016-04-15: 12000 [IU] via ORAL
  Filled 2016-04-15: qty 1

## 2016-04-15 NOTE — Progress Notes (Signed)
Pt's tube feeding clogged most of the shift, despite attempts to declot. After multiple attempts by staff, husband insisted that a member of the [facility] team be called to assess. [facility] member came to unit and  new tube placed, flushed, and placement verified. After placement was verified, TF resumed. Small bend in removed tubing noted upon removal, which may have contributed to the clotting of the line.

## 2016-04-15 NOTE — Progress Notes (Signed)
Occupational Therapy Session Note  Patient Details  Name: Shelly Mcdaniel MRN: [id number] Date of Birth: Nov 13, 1955  Today's Date: 04/15/2016 OT Individual Time: [id number] OT Individual Time Calculation (min): 85 min     Short Term Goals:Week 1:  OT Short Term Goal 1 (Week 1): Pt will initate and complete UB bathing with min assist and no more than mod instructional cueing.  OT Short Term Goal 2 (Week 1): Pt will perform LB bathing with mod assist sit to stand.   OT Short Term Goal 3 (Week 1): Pt will maintain static sitting balance EOB with supervison in preparation for selfcare tasks and functional transfers.   OT Short Term Goal 4 (Week 1): Pt will complete stand pivot transfer to 3:1 with mod assist 2 consecutive sessions.   OT Short Term Goal 5 (Week 1): Pt will tolerate 70 degrees AAROM at the left shoulder with pain less than 4/10.       Skilled Therapeutic Interventions/Progress Updates:    Pt seen for ADL training with a focus on cognition, perception, trunk control. Pt received in bed initially declining a shower. Pt's spouse felt she needed one as she had gotten very sick yesterday. Pt did request to toilet and maintained continence until she was transferred to toilet.  Pt completed all transfers from bed to w/c, w/c >< toilet with grab bar, and w/c >< tub bench with grab bar all with max A as pt was quite internally distracted, having difficulty focusing, leaning in alternating directions, not attending to L side. On toilet and bench, pt required min - mod A with sitting balance as she was constantly moving her trunk side to side. Max cues to fully attend to L side, to wash arm. Pt demonstrating apraxia with use of R side and confusion between L and R. For example, therapist donned R ted hose and L foot still bare, handed pt R shoe. Pt attempted to put it on L foot. Spent time with pt and spouse discussing cognitive strategies of stopping and thinking about what she needs to do prior to  doing it, such as looking at her feet and thinking about the fact her sock is already on R foot but not L.  Pt's spouse did feel that she had a "rough" night last night and did feel she was more "disconnected" today.  Pt resting in w/c in room with quick release belt on, lap tray in place.  Therapy Documentation Precautions:  Precautions Precautions: Fall Precaution Comments: painful left arm with moderate left hemiparesis  Required Braces or Orthoses: Other Brace/Splint Other Brace/Splint: resting hand splint for the left hand at night Restrictions Weight Bearing Restrictions: No      Pain: Pain Assessment Pain Assessment: No/denies pain at rest - pain in L arm with passive movement  ADL:  See Function Navigator for Current Functional Status.   Therapy/Group: Individual Therapy  SAGUIER,JULIA 04/15/2016, 12:34 PM

## 2016-04-15 NOTE — Progress Notes (Signed)
Physical Therapy Session Note  Patient Details  Name: Shelly Mcdaniel MRN: [id number] Date of Birth: 06/03/1956  Today's Date: 04/15/2016 PT Individual Time: 1110-1200 AND 1300-1410 PT Individual Time Calculation (min): 50 min AND 70min   Short Term Goals: Week 1:  PT Short Term Goal 1 (Week 1): Patient will consistently perform sit<>stand with Mod Assist and RLAD PT Short Term Goal 2 (Week 1): Patient will perform stand pivot/ squat pivot with mod assist consistently.  PT Short Term Goal 3 (Week 1): Patient will ambulate 30ft with max Assist and LRAD  PT Short Term Goal 4 (Week 1): Patient will perform all bed mobility with mod assist from PT.  PT Short Term Goal 5 (Week 1): Patient will initiate WC mobility training.   Skilled Therapeutic Interventions/Progress Updates:     Patient received sitting in WC with LAFO donned and agreeable to PT.   WC mobility x 100ft with supervision Assist with mod cues for continued participation/attention to activity as well as awareness of obstacles on the L side.   Gait with rail in hall and Ridged LAFO x 25ft mod assist from PT. Patient able to progress the LLE with stabilization of the pelvis and required mod cues for proper step-to gait sequencing.  Supine NMR. Lumbar rotations x 8 Bilalater L and R with min cues for return to midline from the L, bridges x 8 with 3 seconds, hip abduction/adduction against manual resistance.   Patient performed sit<>supine pre and post bed exercises with mod Assist from PT to manage 1 LE and trunk.   Patient returned to room and left sitting in WC with call bell in reach and all needs met.   Session 2:   Patient received sitting in WC with husband present. Husband reports that patient is more lethargic on this day compared to previous.  WC mobility x 50ft with min assist with hand over hand assist to find Wheel rim rather and brake, as well as continued attention to task due to increased lethargy.   Transfers  training instructed by PT, Including sit<>stand with min-mod assist and LBQC x 8. Stand pivot with mod assist using LBCQ x 5 and no AD x 4. squat pivot x 3 with mod assist from PT. PT was required to provide increased cues for initiation and sequencing compared to earlier session.   Sitting balance and trunk control on mat table x 15 minutes. With mod cues to improve postural alignment, hold head up, and reduce WB through RUE. Patient able to sustain erect posture up to 45 seconds before returned to flexed posture.   Car transfer with mod assist with LBQC. Max cues for AD management and step pattern to prevent LOB.   Bed mobility. Including supine<>sit with max Assist due to decreased arousal to return to bed at end of session. Patient left supine in bed with call bell in reach. .      Therapy Documentation Precautions:  Precautions Precautions: Fall Precaution Comments: painful left arm with moderate left hemiparesis  Required Braces or Orthoses: Other Brace/Splint Other Brace/Splint: resting hand splint for the left hand at night Restrictions Weight Bearing Restrictions: No   Pain: Pain Assessment Pain Assessment: No/denies pain   See Function Navigator for Current Functional Status.   Therapy/Group: Individual Therapy   E  04/15/2016, 2:05 PM  

## 2016-04-15 NOTE — Progress Notes (Signed)
Speech Language Pathology Weekly Progress and Session Note  Patient Details  Name: Shelly Mcdaniel MRN: [id number] Date of Birth: 1955/11/10  Beginning of progress report period: October 27,2017  End of progress report period: April 15, 2016  Today's Date: 04/15/2016 SLP Individual Time: 0800-0900 SLP Individual Time Calculation (min): 60 min   Short Term Goals:Week 1: SLP Short Term Goal 1 (Week 1): Pt will sustain her attention to basic, familiar tasks for 1 minute intervals with mod assist multimodal cues for redirection.   SLP Short Term Goal 1 - Progress (Week 1): Progressing toward goal SLP Short Term Goal 2 (Week 1): Pt will intiate and complete basic, familiar tasks with no more than 5 verbal cues for sequencing and organization.  SLP Short Term Goal 2 - Progress (Week 1): Progressing toward goal SLP Short Term Goal 3 (Week 1): Pt will utilize external aids to orient to place, date, and situation with mod assist verbal and visual cues.   SLP Short Term Goal 3 - Progress (Week 1): Met SLP Short Term Goal 4 (Week 1): Pt will utilize call bell to convey needs and wants to caregivers with max assist multimodal cues.  SLP Short Term Goal 4 - Progress (Week 1): Other (comment) (not adddressed this reporting period ) SLP Short Term Goal 5 (Week 1): Pt will consume regular textures and thin liquids with mod I use of swallowing precautions.  SLP Short Term Goal 5 - Progress (Week 1): Revised due to lack of progress (due to cognition )    New Short Term Goals: Week 2: SLP Short Term Goal 1 (Week 2): Pt will sustain her attention to basic, familiar tasks for 1 minute intervals with mod assist multimodal cues for redirection.   SLP Short Term Goal 2 (Week 2): Pt will intiate and complete basic, familiar tasks with no more than 5 verbal cues for sequencing and organization.  SLP Short Term Goal 3 (Week 2): Pt will utilize external aids to orient to place, date, and situation with min assist  verbal and visual cues.   SLP Short Term Goal 4 (Week 2): Pt will utilize call bell to convey needs and wants to caregivers with max assist multimodal cues.  SLP Short Term Goal 5 (Week 2): Pt will consume regular textures and thin liquids with mod assist for swallow initiation and clearance of oral cavity.  Weekly Progress Updates: Pt has made slow inconsistent gains while inpatient and has met 1 out of 4 targeted goals.  Goal for use of call bell not addressed this reporting period due to the severity of pt's attention and initiation impairment.  Pt is consuming limited amounts of regular textures and thin liquids with max assist multimodal cues for safety due to cognition impacting her attention to boluses and swallow initiation.  Pt needs max assist multimodal cues for basic tasks due to severe cognitive impairment characterized by fluctuating orientation, decreased sustained attention to tasks, limited intellectual awareness of deficits, poor task initiation, and decreased recall of daily information.  As a result, pt would continue to benefit from skilled ST while inpatient in order to maximize functional independence and reduce burden of care prior to discharge.  Anticipate that pt will need 24/7 supervision at discharge in addition to [place] follow up at next level of care to address cognitive and swallowing function.   Pt's husband is frequently present at the beginning of therapy sessions but excuses himself due to feeling he is a distraction to pt.  Therefore  pt and family education has been limited up until this point.      Intensity: Minumum of 1-2 x/day, 30 to 90 minutes Frequency: 3 to 5 out of 7 days Duration/Length of Stay: 14-21 days  Treatment/Interventions: Cognitive remediation/compensation;Cueing hierarchy;Functional tasks;Environmental controls;Internal/external aids;Patient/family education   Daily Session  Skilled Therapeutic Interventions: Pt was seen for skilled ST targeting  cognitive and dysphagia goals.  Pt awake upon arrival and agreeable to eating breakfast.  During meal, pt demonstrated improved task initiation with max assist verbal and tactile cues to feed herself.  Once food was in mouth, pt needed mod-max assist verbal cues to manipulate and clear materials from the oral cavity due to decreased swallow initiation resulting from impaired attention to boluses.  No overt s/s of aspiration were evident with solids or liquids.  Pt oriented to place and situation with max assist verbal cues.  Pt was left in bed with bed alarm set and call bell within reach.  Goals updated on this date to reflect current progress and plan of care.     Function:   Eating Eating   Modified Consistency Diet: No Eating Assist Level: Supervision or verbal cues;More than reasonable amount of time;Set up assist for;Help managing cup/glass;Helper scoops food on utensil;Helper checks for pocketed food;Help with picking up utensils   Eating Set Up Assist For: Opening containers;Cutting food Helper [place] on Utensil: Every scoop Helper Brings Food to Mouth: Every scoop   Cognition Comprehension Comprehension assist level: Understands basic 50 - 74% of the time/ requires cueing 25 - 49% of the time  Expression   Expression assist level: Expresses basic 50 - 74% of the time/requires cueing 25 - 49% of the time. Needs to repeat parts of sentences.  Social Interaction Social Interaction assist level: Interacts appropriately 25 - 49% of time - Needs frequent redirection.  Problem Solving Problem solving assist level: Solves basic 25 - 49% of the time - needs direction more than half the time to initiate, plan or complete simple activities  Memory Memory assist level: Recognizes or recalls less than 25% of the time/requires cueing greater than 75% of the time   General    Pain Pain Assessment Pain Assessment: No/denies pain  Therapy/Group: Individual Therapy  Faline Langer, Selinda Orion 04/15/2016, 12:29 PM

## 2016-04-15 NOTE — Progress Notes (Signed)
NGT clogged at beginning of shift unable to flush with water and jevity will not infuse thru [facility] pump. Scheduled to start at 1900 per order, not infusing as day Charge Nurse reporting she gave meds po as she was unable to put thru NGT. States she pulled back a little thinking maybe it was against a wall then advanced forward. Staff nurse notifies Electrical engineeright Charge Nurse after being unable to unclog NGT. Charge nurse attempts to unclog NGT and is also unsuccessful. Staff nurse places call to rapid response team and spoke with Nehemiah SettleBrooke who states to call pharmacy to request enzymes that may be able to unclog NGT, but also voices a concern with placement of NGT at this time and would notify the physician to inform of findings. [facility] for Dr. Wynn BankerKirsteins to report findings. Staff discusses concerns with Riley Lamunice if able to unclog NGT, placement still needs to be verified before starting feeding. Call back from [facility] stating to wait until morning when KUB can be done verifying placement before starting feeding. Hs meds given in choc. Pudding and juice with pt's husband Jesusita OkaDan and son Vickki MuffWeston present at bedside and aware complications with NGT.

## 2016-04-15 NOTE — Progress Notes (Signed)
[place] PHYSICAL MEDICINE & REHABILITATION     PROGRESS NOTE    Subjective/Complaints: Problems with core tek (feeding tube)  Could not pass TF, TF held once again overnite   ROS limited due to cognition  Objective: Vital Signs: Blood pressure (!) 152/99, pulse 99, temperature 97.5 F (36.4 C), temperature source Axillary, resp. rate 18, height 5\' 7"  (1.702 m), weight 76.3 kg (168 lb 3.4 oz), SpO2 97 %. Dg Abd 1 View  Result Date: 04/14/2016 CLINICAL DATA:  Feeding tube placement EXAM: ABDOMEN - 1 VIEW COMPARISON:  04/13/2016 FINDINGS: Single spot fluoroscopic image submitted for interpretation. A 12 French nasoenteric feeding tube was placed under fluoroscopic guidance. Total fluoroscopy time was 2 minutes 36 seconds. Contrast utilize was 10 cc of Isovue-300. The image demonstrates a feeding tube tip positioned at the ligament of Treitz. There is contrast opacification of duodenum jejunal junction. IMPRESSION: Fluoroscopically placed feeding tube, the tip projects at the duodenum jejunal junction. Electronically Signed   By: Jasmine Pang M.D.   On: 04/14/2016 15:31   Dg Abd Portable 1v  Result Date: 04/13/2016 CLINICAL DATA:  Recheck of feeding tube since replacement and repositioning. EXAM: PORTABLE ABDOMEN - 1 VIEW COMPARISON:  KUB of April 12, 2016 FINDINGS: The radiodense feeding tube tip lies in the region of the third portion of the duodenum. Previously the tube tip was in the fourth portion of the duodenum. The bowel gas pattern is unremarkable. IMPRESSION: Reasonable positioning of the feeding tube with the tip projecting in the third portion of the duodenum. Electronically Signed   By: David  [place] M.D.   On: 04/13/2016 16:12   Dg Vangie Bicker G Tube Plc W/fl-no Rad  Result Date: 04/14/2016 CLINICAL DATA:  NASO G TUBE PLACEMENT WITH FLUORO Fluoroscopy was utilized by the requesting physician.  No radiographic interpretation.   No results for input(s): WBC, HGB, HCT, PLT in the  last 72 hours.  Recent Labs  04/13/16 0854  NA 141  K 3.9  CL 107  GLUCOSE 339*  BUN 16  CREATININE 0.72  CALCIUM 9.2   CBG (last 3)   Recent Labs  04/15/16 0018 04/15/16 0510 04/15/16 0721  GLUCAP 241* 119* 150*    Wt Readings from Last 3 Encounters:  04/14/16 76.3 kg (168 lb 3.4 oz)  04/05/16 73.3 kg (161 lb 9.6 oz)  01/15/16 75.2 kg (165 lb 12.8 oz)    Physical Exam:  Constitutional: lying comfortably in bed HENT:  Head: Normocephalic and atraumatic.  Right Ear: External ear normal.  Left Ear: External ear normal.  Mouth/Throat: Oropharynx is clear and moist.  Eyes: Conjunctivae and EOM are normal. Pupils are equal, round, and reactive to light. No scleral icterus.  Neck: No JVD present. No tracheal deviation present. No thyromegaly present.  Cardiovascular: Normal rate, regular rhythm and intact distal pulses.  Exam reveals no friction rub.   No murmur heard. Respiratory: Effort normal. No respiratory distress. She has no wheezes. She has no rales. She exhibits no tenderness.  GI: She exhibits no distension. There is no tenderness. There is no rebound.  Musculoskeletal: She exhibits no edema. Tenderness: left shoulder still tender with PROM. 1/2" sublux.No pain with RUE ROM, No tenderness in chest or in RUE, BLE, pain with Left shoulder palpation , as well as ROM LUENeurological: She is anxious/confused. She displays abnormal reflexes (LUE and LLE 3+). A cranial nerve deficits (left central 7 and tongue deviation.) is present. She exhibits abnormal muscle tone (MAS Left shoulder adductors 2-3/4.  biceps 2/4, wrist/finger flexors 2/4. 1/4 knee flexors/plantrar flexors. 4-5 beats of clonus at left ankle--no change). Pain with attempted finger ext and wrist ext and elbow ext Coordination normal.  RUE and RLE 4 to 4+/5. LUE underlying 1/4 deltoid, biceps, triceps, wrist and finger flexors.  LLE 1+ HF, KE and 0/5 ADF/PF.--no change   Skin: No rash noted. No erythema. No  pallor.  Psychiatric: She has a normal mood and affect.  Anxious/distracted  Assessment/Plan: 1. 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 Physiatrist is providing close team supervision and 24 hour management of active medical problems listed below. Physiatrist and rehab team continue to assess barriers to discharge/monitor patient progress toward functional and medical goals.  Function:  Bathing Bathing position   Position: Shower  Bathing parts Body parts bathed by patient: Chest, Abdomen, Right upper leg, Right lower leg Body parts bathed by helper: Right arm, Left arm, Front perineal area, Buttocks, Left upper leg, Left lower leg, Back  Bathing assist Assist Level: Touching or steadying assistance(Pt > 75%)      Upper Body Dressing/Undressing Upper body dressing   What is the patient wearing?: Pull over shirt/dress     Pull over shirt/dress - Perfomed by patient: Thread/unthread right sleeve, Put head through opening, Pull shirt over trunk Pull over shirt/dress - Perfomed by helper: Thread/unthread left sleeve, Put head through opening, Pull shirt over trunk, Thread/unthread right sleeve        Upper body assist        Lower Body Dressing/Undressing Lower body dressing   What is the patient wearing?: Pants, Ted Hose, Non-skid slipper socks, Shoes     Pants- Performed by patient: Thread/unthread right pants leg Pants- Performed by helper: Thread/unthread right pants leg, Thread/unthread left pants leg, Pull pants up/down Non-skid slipper socks- Performed by patient: Don/doff right sock (doff) Non-skid slipper socks- Performed by helper: Don/doff right sock, Don/doff left sock     Shoes - Performed by patient: Don/doff right shoe Shoes - Performed by helper: Don/doff right shoe, Don/doff left shoe, Fasten right, Fasten  left       TED Hose - Performed by helper: Don/doff right TED hose, Don/doff left TED hose  Lower body assist Assist for lower body dressing: Touching or steadying assistance (Pt > 75%)      Toileting Toileting Toileting activity did not occur: No continent bowel/bladder event   Toileting steps completed by helper: Adjust clothing prior to toileting, Performs perineal hygiene, Adjust clothing after toileting    Toileting assist Assist level: Two helpers   Transfers Chair/bed transfer   Chair/bed transfer method: Stand pivot Chair/bed transfer assist level: Moderate assist (Pt 50 - 74%/lift or lower) Chair/bed transfer assistive device: Armrests, Bedrails     Locomotion Ambulation     Max distance: 10 Assist level: Moderate assist (Pt 50 - 74%)   Wheelchair   Type: Manual Max wheelchair distance: 12200ft Assist Level: Touching or steadying assistance (Pt > 75%)  Cognition Comprehension Comprehension assist level: Understands basic 50 - 74% of the time/ requires cueing 25 - 49% of the time  Expression Expression assist level: Expresses basic 50 - 74% of the time/requires cueing 25 - 49% of the time. Needs to repeat parts of sentences.  Social Interaction Social Interaction assist level: Interacts appropriately 25 - 49% of time - Needs frequent redirection.  Problem Solving Problem solving assist level: Solves basic 25 -  49% of the time - needs direction more than half the time to initiate, plan or complete simple activities  Memory Memory assist level: Recognizes or recalls less than 25% of the time/requires cueing greater than 75% of the time   Medical Problem List and Plan: 1.  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  DVT Prophylaxis/Anticoagulation: Lovenox. Monitor for any bleeding episode 3. Pain Management with recent fall and persistent rib cage pain. Also has had  prominent spasms/cramping at night  -Lidoderm patch increase to 3 patches Baclofen10 mg 3 times a day, Robaxin as needed.  -resumed lyrica (home medication) at 75mg  qhs (was using 75mg  in am and 150mg  in pm at home)--will cont 50mg  BID Mentally more clear on current dose 4. Mood: Zoloft 50 mg daily, 5. Neuropsych: This patient is not capable of making decisions on her own behalf.   -HS confusion/altered sleep/wake cycle which has carried over from acute to admission here.  -increase melatonin to 6mg  QHS (home medication)  -keep sleep chart  -need to re-establish sleep wake cycle  -all labs reviewed today  -ua/ucx negative 6. Skin/Wound Care: Routine skin checks 7. 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 Hemoglobin A1c 6.7.  Check blood sugars before meals and at bedtime  -sugars low given sparce po intake  -increase levemir 25U qhs  -Patient also Glucophage 1000 mg BID PTA, will start 850mg  BID 11/2 CBG (last 3)   Recent Labs  04/15/16 0018 04/15/16 0510 04/15/16 0721  GLUCAP 241* 119* 150*    9.Hypertension with bouts of  orthostasis. Presently on Norvasc 5 mg daily. Patient also on lisinopril 40 mg daily as well as Accuretic prior to admission. Resume as needed 10 Hyperlipidemia .Zocor 40 mg daily 12. Spastic left hemiparesis, acute on chronic             -baclofen just resumed , Elbow flexor spasticity better, still has pain with PROM FFs             -consider botox injections, discussed with husband who is comfortable with that             -continue splinting/ROM- improving 13. Poor po intake/altered mentation  -NGT placed- TF at noc , enc po during the day  -continue TF at night---encourage PO during the day,  -anxiety/cognitive component most prominently      LOS (Days) 7 A  FACE TO FACE EVALUATION WAS PERFORMED  Shelly Mcdaniel,Shelly Mcdaniel 04/15/2016 7:27 AM

## 2016-04-16 ENCOUNTER — Inpatient Hospital Stay (HOSPITAL_COMMUNITY): Payer: BC Managed Care – PPO | Admitting: Physical Therapy

## 2016-04-16 ENCOUNTER — Inpatient Hospital Stay (HOSPITAL_COMMUNITY): Payer: BC Managed Care – PPO | Admitting: Occupational Therapy

## 2016-04-16 LAB — GLUCOSE, CAPILLARY
GLUCOSE-CAPILLARY: 132 mg/dL — AB (ref 65–99)
Glucose-Capillary: 368 mg/dL — ABNORMAL HIGH (ref 65–99)
Glucose-Capillary: 245 mg/dL — ABNORMAL HIGH (ref 65–99)
Glucose-Capillary: 210 mg/dL — ABNORMAL HIGH (ref 65–99)

## 2016-04-16 NOTE — Progress Notes (Signed)
Physical Therapy Weekly Progress Note  Patient Details  Name: Shelly Mcdaniel MRN: [id number] Date of Birth: 05-11-1956  Beginning of progress report period: April 09, 2016 End of progress report period: April 16, 2016  Today's Date: 04/16/2016 PT Individual Time: 5397-6734 PT Individual Time Calculation (min): 47 min   Patient has met 4 of 5 short term goals.  Patient continues to require various assist for transfers due to fluctuation in arousal.   Patient continues to demonstrate the following deficits: sequencing of functional movement, apraxia, decreased attention to L side, decreased muscle tone, poor balance strategies, decreased postural control and therefore will continue to benefit from skilled PT intervention to enhance overall performance with activity tolerance, balance, postural control, ability to compensate for deficits, functional use of  left upper extremity and left lower extremity, attention, awareness and coordination.  Patient progressing toward long term goals..  Continue plan of care.  PT Short Term Goals Week 1:  PT Short Term Goal 1 (Week 1): Patient will consistently perform sit<>stand with Mod Assist and RLAD PT Short Term Goal 1 - Progress (Week 1): Met PT Short Term Goal 2 (Week 1): Patient will perform stand pivot/ squat pivot with mod assist consistently.  PT Short Term Goal 2 - Progress (Week 1): Progressing toward goal PT Short Term Goal 3 (Week 1): Patient will ambulate 27f with max Assist and LRAD  PT Short Term Goal 3 - Progress (Week 1): Met PT Short Term Goal 4 (Week 1): Patient will perform all bed mobility with mod assist from PT.  PT Short Term Goal 4 - Progress (Week 1): Met PT Short Term Goal 5 (Week 1): Patient will initiate WC mobility training.  PT Short Term Goal 5 - Progress (Week 1): Met Week 2:  PT Short Term Goal 1 (Week 2): Patient will perform sit<>stand transfers with min Assist  PT Short Term Goal 2 (Week 2): patient will  perform squat pivot with min assit from PT.  PT Short Term Goal 3 (Week 2): patient will perform gait x 423fwith LRAD and mod Assist.  PT Short Term Goal 4 (Week 2): Patient will be able sustain attent to task for >5 minutes with only min cues.  PT Short Term Goal 5 (Week 2): Patient will performed WC mobility with supervision Assist x [id number]  Skilled Therapeutic Interventions/Progress Updates:     Patient received supine in bed and agreeable to PT. Patient instructed in bed mobility to sitting EOB with mod assist from PT. PT donned shoes and AFO sitting EOB with cues for improved posture; patient abel to sustain for up to 45 seconds. Stand pivot transfer with max Assist to prevent LOB and max cues for attention to the L side. WC mobility with R hemi technique x [id number] supervision Assist and mod-max cues for sustained attention on task. Gait training for 15ft64fwith QC with max assist to prevent LOB and max cues for proper sequencing, erect posture, and gait pattern; patient able to advance LLE with tactile cues and manual facilitation of pelvic rotation. Patient returned to room and performed squat pivot to the R with mod assist and sit>supine transfer with mod Assist. Patient left supine in bed with call bell in reach.   Therapy Documentation Precautions:  Precautions Precautions: Fall Precaution Comments: painful left arm with moderate left hemiparesis  Required Braces or Orthoses: Other Brace/Splint Other Brace/Splint: resting hand splint for the left hand at night Restrictions Weight Bearing Restrictions: No General:  Vital Signs: Therapy Vitals Temp: 98 F (36.7 C) Temp Source: Oral Pulse Rate: 96 Resp: 17 BP: 131/89 Patient Position (if appropriate): Lying Oxygen Therapy SpO2: 97 % O2 Device: Not Delivered   See Function Navigator for Current Functional Status.  Therapy/Group: Individual Therapy  Lorie Phenix 04/16/2016, 4:37 PM

## 2016-04-16 NOTE — Progress Notes (Signed)
Patient ID: Shelly SimmerJody D Mcdaniel, female   DOB: [birth year], 60 y.o.   MRN: [id number]   04/16/16.   [place] PHYSICAL MEDICINE & REHABILITATION     PROGRESS NOTE   60 year old patient who is admitted for CIR with Left spastic hemiplegia/dysarthria secondary to right pontine infarct with recent extension.  Subjective/Complaints:  Comfortable night.  No new issues.  Tolerating nasogastric feedings better   ROS limited due to cognition  Past Medical History:  Diagnosis Date  . Diabetes mellitus without complication (HCC)   . Hyperlipidemia   . Hypertension      Objective: Vital Signs: Blood pressure (!) 142/84, pulse 96, temperature 97.9 F (36.6 C), temperature source Oral, resp. rate 18, height 5\' 7"  (1.702 m), weight 166 lb 10.7 oz (75.6 kg), SpO2 97 %. Dg Abd 1 View  Result Date: 04/14/2016 CLINICAL DATA:  Feeding tube placement EXAM: ABDOMEN - 1 VIEW COMPARISON:  04/13/2016 FINDINGS: Single spot fluoroscopic image submitted for interpretation. A 12 French nasoenteric feeding tube was placed under fluoroscopic guidance. Total fluoroscopy time was 2 minutes 36 seconds. Contrast utilize was 10 cc of Isovue-300. The image demonstrates a feeding tube tip positioned at the ligament of Treitz. There is contrast opacification of duodenum jejunal junction. IMPRESSION: Fluoroscopically placed feeding tube, the tip projects at the duodenum jejunal junction. Electronically Signed   By: Jasmine PangKim  Fujinaga M.D.   On: 04/14/2016 15:31   Dg Abd Portable 1v  Result Date: 04/15/2016 CLINICAL DATA:  Feeding tube placement EXAM: PORTABLE ABDOMEN - 1 VIEW COMPARISON:  04/15/2016 at 0758 hours FINDINGS: Weighted feeding tube terminates in the gastric antrum. Nonobstructive bowel gas pattern. IMPRESSION: Weighted feeding tube terminates in the gastric antrum. Electronically Signed   By: Charline BillsSriyesh  Krishnan M.D.   On: 04/15/2016 17:26   Dg Abd Portable 1v  Result Date: 04/15/2016 CLINICAL DATA:  60 year old  female feeding tube placement. Initial encounter. EXAM: PORTABLE ABDOMEN - 1 VIEW COMPARISON:  04/14/2016 and earlier. FINDINGS: Portable AP view at 0750 hours. Feeding tube tip position is stable up the level of the ligament of Treitz. There is mildly increased looping of redundant tubing in the stomach since the image yesterday. Visualized bowel gas pattern is non obstructed. Negative visualized lung parenchyma. Normal cardiac size and mediastinal contours. IMPRESSION: Stable feeding tube tip position at the level of the ligament of Treitz. Electronically Signed   By: Odessa FlemingH  Hall M.D.   On: 04/15/2016 08:03   Dg Vangie BickerNaso G Tube Plc W/fl-no Rad  Result Date: 04/14/2016 CLINICAL DATA:  NASO G TUBE PLACEMENT WITH FLUORO Fluoroscopy was utilized by the requesting physician.  No radiographic interpretation.   No results for input(s): WBC, HGB, HCT, PLT in the last 72 hours. No results for input(s): NA, K, CL, GLUCOSE, BUN, CREATININE, CALCIUM in the last 72 hours.  Invalid input(s): CO CBG (last 3)   Recent Labs  04/15/16 1628 04/15/16 2110 04/16/16 0645  GLUCAP 179* 198* 368*    Wt Readings from Last 3 Encounters:  04/16/16 166 lb 10.7 oz (75.6 kg)  04/05/16 161 lb 9.6 oz (73.3 kg)  01/15/16 165 lb 12.8 oz (75.2 kg)   BP Readings from Last 3 Encounters:  04/16/16 (!) 142/84  04/08/16 (!) 162/97  04/04/16 (!) 147/104   Physical Exam:  Constitutional: lying comfortably in bed HENT: Nasogastric tube noted Head: Normocephalic and atraumatic.   Mouth/Throat: Oropharynx is clear and moist.  Eyes: Conjunctivae and EOM are normal. Pupils are equal, round, and reactive to light. No  scleral icterus.  Neck: No JVD present. No tracheal deviation present. No thyromegaly present.  Cardiovascular: Normal rate, regular rhythm and intact distal pulses.   No murmur heard. Respiratory: Effort normal. No respiratory distress. She has no wheezes. She has no rales. She exhibits no tenderness.  GI: She  exhibits no distension. There is no tenderness. There is no rebound.  Musculoskeletal: She exhibits no edema. Tenderness: left shoulder still tender with PROM. Neurological: She is anxious/confused.  Left hemiparesis   Skin: No rash noted. No erythema. No pallor.  IV line present, left arm, right hand mitten in place     Medical Problem List and Plan: 1.  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  DVT Prophylaxis/Anticoagulation: Lovenox. Monitor for any bleeding episode  3. Mood: Zoloft 50 mg daily, 4. Neuropsych: This patient is not capable of making decisions on her own behalf.   -HS confusion/altered sleep/wake cycle which has carried over from acute to admission here.  -increase melatonin to 6mg  QHS (home medication)    5. 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 Hemoglobin A1c 6.7.  Check blood sugars before meals and at bedtime   -increase levemir 25U qhs  -Patient also Glucophage 1000 mg BID PTA, will start 850mg  BID  CBG (last 3)   Recent Labs  04/15/16 1628 04/15/16 2110 04/16/16 0645  GLUCAP 179* 198* 368*    6.Hypertension with bouts of  orthostasis. Presently on Norvasc 5 mg daily. Patient also on lisinopril 40 mg daily as well as Accuretic prior to admission. Resume as needed 7 Hyperlipidemia .Zocor 40 mg daily 8. Spastic left hemiparesis, acute on chronic             -baclofen just resumed , Elbow flexor spasticity better, still has pain with PROM FFs             -consider botox injections, discussed with husband who is comfortable with that             -continue splinting/ROM- improving 9. Poor po intake/altered mentation  -NGT placed-   LOS (Days) 8 A FACE TO FACE EVALUATION WAS PERFORMED  Rogelia BogaKWIATKOWSKI,Alzada [place]razee FRANK 04/16/2016 11:04 AM

## 2016-04-17 ENCOUNTER — Inpatient Hospital Stay (HOSPITAL_COMMUNITY): Payer: BC Managed Care – PPO | Admitting: Occupational Therapy

## 2016-04-17 LAB — GLUCOSE, CAPILLARY
GLUCOSE-CAPILLARY: 325 mg/dL — AB (ref 65–99)
Glucose-Capillary: 175 mg/dL — ABNORMAL HIGH (ref 65–99)
Glucose-Capillary: 181 mg/dL — ABNORMAL HIGH (ref 65–99)
Glucose-Capillary: 212 mg/dL — ABNORMAL HIGH (ref 65–99)
Glucose-Capillary: 215 mg/dL — ABNORMAL HIGH (ref 65–99)
Glucose-Capillary: 291 mg/dL — ABNORMAL HIGH (ref 65–99)

## 2016-04-17 MED ORDER — LISINOPRIL 20 MG PO TABS
20.0000 mg | ORAL_TABLET | Freq: Every day | ORAL | Status: DC
  Administered 2016-04-17 – 2016-04-27 (×11): 20 mg via ORAL
  Filled 2016-04-17 (×2): qty 1
  Filled 2016-04-17: qty 2
  Filled 2016-04-17 (×8): qty 1

## 2016-04-17 MED ORDER — INSULIN DETEMIR 100 UNIT/ML ~~LOC~~ SOLN
30.0000 [IU] | Freq: Every day | SUBCUTANEOUS | Status: DC
  Administered 2016-04-17 – 2016-04-23 (×7): 30 [IU] via SUBCUTANEOUS
  Filled 2016-04-17 (×8): qty 0.3

## 2016-04-17 NOTE — Progress Notes (Signed)
Occupational Therapy Session Note  Patient Details  Name: Shelly SimmerJody D Batiz MRN: [id number] Date of Birth: [birth year]  Today's Date: 04/17/2016 OT Individual Time: [id number] OT Individual Time Calculation (min): 82 min     Short Term Goals: Week 2:  OT Short Term Goal 1 (Week 2): Pt will initate and complete UB bathing with min assist and no more than mod instructional cueing.  OT Short Term Goal 2 (Week 2): Pt will perform LB bathing with mod assist sit to stand.   OT Short Term Goal 3 (Week 2): Pt will maintain static sitting balance EOB with supervison in preparation for selfcare tasks and functional transfers.   OT Short Term Goal 4 (Week 2): Pt will complete stand pivot transfer to 3:1 with mod assist 2 consecutive sessions.    Skilled Therapeutic Interventions/Progress Updates:   Patient participated in skilled OT as follows: (dtr and husband present for session)  With all movements, patient complained of right leg pain in areas she described on hamstrings, periformis, right gastronemis, and right foot.   This clinician helped stretch those areas and continued to assist patient with supine with head of bed elevated to edge of bed transfer (max A).   Patient required much time to process the task and immediately c/o fatigue and pain (pain in right leg/hip and left UE throughout if touched)  Patient worked on postural alignment, static and dynamic sitting balance while seated at the edge of the bed (max A and encouragement to participate)  Edge of bed to w/c transfer (to patient's right unaffected side)= mod A stand pivot Then immediately patient stated she needed to void and have a BM - thus w/c to 3:1transfer to patient's right unaffected side=Mod A  Toileting = Total A x2   Patient c/o nausea during toileting and RN gave anti nausea meds  3:1- recliner transfer= Max stand pivot as patient fatigued;       Then patient c/o more fatigue and went right to sleep upon transfer into the  recliner  At session's end, patient was left seated and sleeping in her recliner.    Before going to sleep, she demonstrated use of flat call bell.   This clinician made nurse tech aware that patient was reclined back and sleep in her recliner chair and that husband and dtr  left to get lunch and stated he would be back momentarily.  Therapy Documentation Precautions:  Precautions Precautions: Fall Precaution Comments: painful left arm with moderate left hemiparesis  Required Braces or Orthoses: Other Brace/Splint Other Brace/Splint: resting hand splint for the left hand at night Restrictions Weight Bearing Restrictions: No Pain:denied See Function Navigator for Current Functional Status.   Therapy/Group: Individual Therapy  Bud Faceickett, Santana Edell [facility] CampusYeary 04/17/2016, 4:44 PM

## 2016-04-17 NOTE — Progress Notes (Signed)
Patient ID: Shelly SimmerJody D Spicher, female   DOB: [birth year], 60 y.o.   MRN: [id number]   04/17/16.   [place] [facility] MEDICINE & REHABILITATION     PROGRESS NOTE   60 year old patient who is admitted for CIR with Left spastic hemiplegia/dysarthria secondary to right pontine infarct with recent extension.  Subjective/Complaints:  Comfortable night.  No new issues.  Still has NG tube, but seems to be tolerating  regular diet.  Blood pressure elevated earlier but seems to be trending down after amlodipine  ROS limited due to cognition  Past Medical History:  Diagnosis Date  . Diabetes mellitus without complication (HCC)   . Hyperlipidemia   . Hypertension      Objective: Vital Signs: Blood pressure (!) 170/104, pulse 93, temperature 98 F (36.7 C), temperature source Oral, resp. rate 18, height 5\' 7"  (1.702 m), weight 160 lb 11.2 oz (72.9 kg), SpO2 95 %. Dg Abd Portable 1v  Result Date: 04/15/2016 CLINICAL DATA:  Feeding tube placement EXAM: PORTABLE ABDOMEN - 1 VIEW COMPARISON:  04/15/2016 at 0758 hours FINDINGS: Weighted feeding tube terminates in the gastric antrum. Nonobstructive bowel gas pattern. IMPRESSION: Weighted feeding tube terminates in the gastric antrum. Electronically Signed   By: Charline BillsSriyesh  Krishnan M.D.   On: 04/15/2016 17:26   No results for input(s): WBC, HGB, HCT, PLT in the last 72 hours. No results for input(s): NA, K, CL, GLUCOSE, BUN, CREATININE, CALCIUM in the last 72 hours.  Invalid input(s): CO CBG (last 3)   Recent Labs  04/17/16 0041 04/17/16 0438 04/17/16 0730  GLUCAP 212* 291* 325*    Wt Readings from Last 3 Encounters:  04/17/16 160 lb 11.2 oz (72.9 kg)  04/05/16 161 lb 9.6 oz (73.3 kg)  01/15/16 165 lb 12.8 oz (75.2 kg)   BP Readings from Last 3 Encounters:  04/17/16 (!) 170/104  04/08/16 (!) 162/97  04/04/16 (!) 147/104   Physical Exam:  Constitutional: lying comfortably in bed HENT: Nasogastric tube noted Head: Normocephalic and  atraumatic.   Mouth/Throat: Oropharynx is clear and moist.  Eyes: Conjunctivae and EOM are normal. Pupils are equal, round, and reactive to light. No scleral icterus.  Neck: No JVD present. No tracheal deviation present. No thyromegaly present.  Cardiovascular: Normal rate, regular rhythm and intact distal pulses.   No murmur heard. Respiratory: Effort normal. No respiratory distress. She has no wheezes. She has no rales. She exhibits no tenderness.  GI: She exhibits no distension. There is no tenderness. There is no rebound.  Musculoskeletal: She exhibits no edema. Tenderness: left shoulder still tender with PROM. Neurological: She is anxious/confused.  Left hemiparesis   Skin: No rash noted. No erythema. No pallor.  IV line present, left arm, right hand mitten in place     Medical Problem List and Plan: 1.  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  DVT Prophylaxis/Anticoagulation: Lovenox. Monitor for any bleeding episode  3. Mood: Zoloft 50 mg daily, 4. Neuropsych: This patient is not capable of making decisions on her own behalf.   -HS confusion/altered sleep/wake cycle which has carried over from acute to admission here.  -increase melatonin to 6mg  QHS (home medication)    5. 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 Hemoglobin A1c 6.7.  Check blood sugars before meals and at bedtime   Blood sugars consistently greater than 200.  Will titrate  basal bolus insulin  -Patient also Glucophage 1000 mg BID PTA, will start 850mg  BID  CBG (last 3)   Recent Labs  04/17/16 0041 04/17/16 0438 04/17/16 0730  GLUCAP 212* 291* 325*    6.Hypertension.  Presently on Norvasc 5 mg daily. Patient also on lisinopril 40 mg daily. Blood pressure elevated this a.m. but trending down.  Following administration of medications.  We'll continue to observe on present  regimen   7 Hyperlipidemia .Zocor 40 mg daily 8. Spastic left hemiparesis, acute on chronic             -baclofen just resumed , Elbow flexor spasticity better, still has pain with PROM FFs             -consider botox injections, discussed with husband who is comfortable with that             -continue splinting/ROM- improving   LOS (Days) 9 A FACE TO FACE EVALUATION WAS PERFORMED  Rogelia BogaKWIATKOWSKI,Zackaria Burkey FRANK 04/17/2016 10:51 AM

## 2016-04-17 NOTE — Progress Notes (Signed)
Occupational Therapy Weekly Progress Note  Patient Details  Name: Shelly Mcdaniel MRN: [id number] Date of Birth: August 15, 1955  Beginning of progress report period: April 09, 2016 End of progress report period: April 17, 2016   Patient has met 1 of 5 short term goals.  [name] continues to demonstrate decreased performance and independence with all selfcare tasks at this time.  Max assist is needed for all transfers to and from the toilet and shower.  Mod to max assist also needed to complete all bathing from shower level sit to stand.  Max assist for all dressing tasks as well with pt demonstrating decreased sustained attention, initiation, and sequencing.  She continues to demonstrate left neglect with LUE function limited to a stabilizer level with max assist.  No active movement noted in the arm and hand with pt voicing increased pain in the digits and wrist as well as increased flexor tone.  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  Feel she will need extensive CIR level therapy to continue at this time.    Patient continues to demonstrate the following deficits: decreased balance, decreased initiation, decreased awareness, decreased cognitive processing, left hemiparesis,  and therefore will continue to benefit from skilled OT intervention to enhance overall performance with BADL and Reduce care partner burden.  Patient progressing toward long term goals..  Continue plan of care.  OT Short Term Goals Week 2:  OT Short Term Goal 1 (Week 2): Pt will initate and complete UB bathing with min assist and no more than mod instructional cueing.  OT Short Term Goal 2 (Week 2): Pt will perform LB bathing with mod assist sit to stand.   OT Short Term Goal 3 (Week 2): Pt will maintain static sitting balance EOB with supervison in preparation for selfcare tasks and functional transfers.   OT Short Term Goal 4 (Week 2): Pt will complete stand pivot transfer to 3:1 with mod  assist 2 consecutive sessions.     Skilled Therapeutic Interventions/Progress Updates:     Therapy Documentation Precautions:  Precautions Precautions: Fall Precaution Comments: painful left arm with moderate left hemiparesis  Required Braces or Orthoses: Other Brace/Splint Other Brace/Splint: resting hand splint for the left hand at night Restrictions Weight Bearing Restrictions: No  See Function Navigator for Current Functional Status.   Therapy/Group: Individual Therapy  Liz Pinho OTR/L 04/17/2016, 11:18 AM

## 2016-04-18 ENCOUNTER — Inpatient Hospital Stay (HOSPITAL_COMMUNITY): Payer: BC Managed Care – PPO

## 2016-04-18 ENCOUNTER — Ambulatory Visit: Payer: BC Managed Care – PPO | Admitting: Rehabilitation

## 2016-04-18 ENCOUNTER — Inpatient Hospital Stay (HOSPITAL_COMMUNITY): Payer: BC Managed Care – PPO | Admitting: Occupational Therapy

## 2016-04-18 ENCOUNTER — Encounter: Payer: BC Managed Care – PPO | Admitting: Occupational Therapy

## 2016-04-18 ENCOUNTER — Inpatient Hospital Stay (HOSPITAL_COMMUNITY): Payer: BC Managed Care – PPO | Admitting: Speech Pathology

## 2016-04-18 LAB — GLUCOSE, CAPILLARY
GLUCOSE-CAPILLARY: 192 mg/dL — AB (ref 65–99)
GLUCOSE-CAPILLARY: 186 mg/dL — AB (ref 65–99)
GLUCOSE-CAPILLARY: 215 mg/dL — AB (ref 65–99)
Glucose-Capillary: 276 mg/dL — ABNORMAL HIGH (ref 65–99)

## 2016-04-18 NOTE — Progress Notes (Signed)
Physical Therapy Note  Patient Details  Name: Shelly Mcdaniel MRN: [id number] Date of Birth: 02-19-1956 Today's Date: 04/18/2016  1410-1505, 55 min individual tx Pain: none noted  neuromuscular re-education via multimodal cues and active assistive as needed for 10 x 1 in supine: R/L straight leg raises, bil lower trunk rotation, mass flexion/mass extension, 2 x10 modified abdominal crunches; R sidelying : bil hip flex/ext.  Bed mobility training for rolling; scooting up toward HOB using bridging.   Supine> sit x 2 with mod assist.  Squat pivot to R with mod assist, mod cues for head/hips relationship. Gait with hallway railing R, LAFO x 20' forward/backward with mod assist for wt shifting, upright trunk and max cues for forward gaze.  L hip retraction limits her forward progression of LLE. Marland Kitchen.Pt left resting in w/c with quick release belt applied and all needs within reach.  [name] 04/18/2016, 2:31 PM

## 2016-04-18 NOTE — Progress Notes (Signed)
[place] PHYSICAL MEDICINE & REHABILITATION     PROGRESS NOTE    Subjective/Complaints: Alert but confused, remembers OT from OP not IP   ROS limited due to cognition  Objective: Vital Signs: Blood pressure 134/84, pulse 95, temperature 98.3 F (36.8 C), temperature source Oral, resp. rate 18, height 5\' 7"  (1.702 m), weight 67 kg (147 lb 11.3 oz), SpO2 98 %. No results found. No results for input(s): WBC, HGB, HCT, PLT in the last 72 hours. No results for input(s): NA, K, CL, GLUCOSE, BUN, CREATININE, CALCIUM in the last 72 hours.  Invalid input(s): CO CBG (last 3)   Recent Labs  04/17/16 1639 04/17/16 2032 04/18/16 0640  GLUCAP 175* 181* 276*    Wt Readings from Last 3 Encounters:  04/18/16 67 kg (147 lb 11.3 oz)  04/05/16 73.3 kg (161 lb 9.6 oz)  01/15/16 75.2 kg (165 lb 12.8 oz)    Physical Exam:  Constitutional: lying comfortably in bed HENT:  Head: Normocephalic and atraumatic.  Right Ear: External ear normal.  Left Ear: External ear normal.  Mouth/Throat: Oropharynx is clear and moist.  Eyes: Conjunctivae and EOM are normal. Pupils are equal, round, and reactive to light. No scleral icterus.  Neck: No JVD present. No tracheal deviation present. No thyromegaly present.  Cardiovascular: Normal rate, regular rhythm and intact distal pulses.  Exam reveals no friction rub.   No murmur heard. Respiratory: Effort normal. No respiratory distress. She has no wheezes. She has no rales. She exhibits no tenderness.  GI: She exhibits no distension. There is no tenderness. There is no rebound.  Musculoskeletal: She exhibits no edema. Tenderness:.No pain with RUE ROM, Neurological: She is anxious/confused. She displays abnormal reflexes (LUE and LLE 3+). A cranial nerve deficits (left central 7 and tongue deviation.) is present. She exhibits abnormal muscle tone (MAS Left shoulder adductors 2-3/4. biceps 2/4, wrist flexors 2/4. 1/4 knee flexors/plantrar flexors. 4-5 beats  of clonus at left ankle--no change). Pain with attempted finger ext and wrist ext and elbow ext Coordination normal.  RUE and RLE 4 to 4+/5. LUE underlying 1/4 deltoid, biceps, triceps, wrist and finger flexors.  LLE 1+ HF, KE and 0/5 ADF/PF.--no change   Skin: No rash noted. No erythema. No pallor.  Psychiatric: She has a normal mood and affect.  Finger flexors are relaxed today, pain with elbow ROM, tone related  Assessment/Plan: 1. 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 Physiatrist is providing close team supervision and 24 hour management of active medical problems listed below. Physiatrist and rehab team continue to assess barriers to discharge/monitor patient progress toward functional and medical goals.  Function:  Bathing Bathing position   Position: Shower  Bathing parts Body parts bathed by patient: Chest, Abdomen, Right upper leg, Right lower leg, Front perineal area, Left arm, Left upper leg Body parts bathed by helper: Right arm, Buttocks, Left lower leg, Back  Bathing assist Assist Level: Touching or steadying assistance(Pt > 75%)      Upper Body Dressing/Undressing Upper body dressing   What is the patient wearing?: Pull over shirt/dress     Pull over shirt/dress - Perfomed by patient: Thread/unthread right sleeve Pull over shirt/dress - Perfomed by helper: Thread/unthread left sleeve, Put head through opening, Pull shirt over trunk        Upper body assist        Lower Body Dressing/Undressing Lower body dressing  What is the patient wearing?: Pants, Ted Hose, Shoes, AFO     Pants- Performed by patient: Thread/unthread right pants leg Pants- Performed by helper: Thread/unthread right pants leg, Thread/unthread left pants leg, Pull pants up/down Non-skid slipper socks- Performed by patient: Don/doff right  sock (doff) Non-skid slipper socks- Performed by helper: Don/doff right sock, Don/doff left sock     Shoes - Performed by patient: Don/doff right shoe Shoes - Performed by helper: Don/doff right shoe, Don/doff left shoe, Fasten right, Fasten left   AFO - Performed by helper: Don/doff left AFO   TED Hose - Performed by helper: Don/doff right TED hose, Don/doff left TED hose  Lower body assist Assist for lower body dressing: Touching or steadying assistance (Pt > 75%)      Toileting Toileting Toileting activity did not occur: No continent bowel/bladder event   Toileting steps completed by helper: Adjust clothing prior to toileting, Performs perineal hygiene, Adjust clothing after toileting Toileting Assistive Devices: Grab bar or rail  Toileting assist Assist level: Two helpers   Transfers Chair/bed transfer   Chair/bed transfer method: Stand pivot Chair/bed transfer assist level: Moderate assist (Pt 50 - 74%/lift or lower) Chair/bed transfer assistive device: Armrests     Locomotion Ambulation     Max distance: 23ft  Assist level: Moderate assist (Pt 50 - 74%)   Wheelchair   Type: Manual Max wheelchair distance: 132ft  Assist Level: Supervision or verbal cues  Cognition Comprehension Comprehension assist level: Understands basic 50 - 74% of the time/ requires cueing 25 - 49% of the time  Expression Expression assist level: Expresses basic 50 - 74% of the time/requires cueing 25 - 49% of the time. Needs to repeat parts of sentences.  Social Interaction Social Interaction assist level: Interacts appropriately 75 - 89% of the time - Needs redirection for appropriate language or to initiate interaction.  Problem Solving Problem solving assist level: Solves basic 75 - 89% of the time/requires cueing 10 - 24% of the time  Memory Memory assist level: Recognizes or recalls 75 - 89% of the time/requires cueing 10 - 24% of the time   Medical Problem List and Plan: 1.  Left spastic  hemiplegia/dysarthria secondary to right pontine infarct with recent extension             -continue CIR PT, OT, SLP 2.  DVT Prophylaxis/Anticoagulation: Lovenox. Monitor for any bleeding episode 3. Pain Management with recent fall and persistent rib cage pain. Also has had prominent spasms/cramping at night  -Lidoderm patch increase to 3 patches Baclofen10 mg 3 times a day helping with tone, Robaxin as needed.  -resumed lyrica (home medication) at 75mg  qhs (was using 75mg  in am and 150mg  in pm at home)--will cont 50mg  BID Mentally more clear on current dose 4. Mood: Zoloft 50 mg daily, 5. Neuropsych: This patient is not capable of making decisions on her own behalf.   -HS confusion/altered sleep/wake cycle which has carried over from acute to admission here.  -increase melatonin to 6mg  QHS (home medication)  -keep sleep chart  -need to re-establish sleep wake cycle  -all labs reviewed today  -ua/ucx negative 6. Skin/Wound Care: Routine skin checks 7. 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 Hemoglobin A1c 6.7.  Check blood sugars before meals and  at bedtime  -sugars low given sparce po intake  -increase levemir 25U qhs  -Start Glucophage 1000 mg BID 11/6 CBG (last 3)   Recent Labs  04/17/16 1639 04/17/16 2032 04/18/16 0640  GLUCAP 175* 181* 276*    9.Hypertension with bouts of  orthostasis. Presently on Norvasc 5 mg daily. Patient also on lisinopril 40 mg daily as well as Accuretic prior to admission. Resume as needed 10 Hyperlipidemia .Zocor 40 mg daily 12. Spastic left hemiparesis, acute on chronic             -baclofen just resumed , Elbow flexor spasticity better, still has pain with PROM FFs             -consider botox injections, discussed with husband who is comfortable with that              -continue splinting/ROM- improving  But fluctuating in terms of location elbow vs fingers 13. Poor po intake/altered mentation  -NGT placed- TF at noc ,   -continue TF at night---encourage PO during the day,  -anxiety/cognitive component most prominently      LOS (Days) 10 A FACE TO FACE EVALUATION WAS PERFORMED  Jontay Maston E 04/18/2016 8:00 AM

## 2016-04-18 NOTE — Plan of Care (Signed)
Problem: RH BOWEL ELIMINATION Goal: RH STG MANAGE BOWEL W/MEDICATION W/ASSISTANCE STG Manage Bowel with Medication with mod Assistance.   Outcome: Not Progressing LBM 04/14/16

## 2016-04-18 NOTE — Progress Notes (Signed)
Occupational Therapy Session Note  Patient Details  Name: Shelly Mcdaniel MRN: [id number] Date of Birth: 1955/09/14  Today's Date: 04/18/2016 OT Individual Time: 1005-1104 OT Individual Time Calculation (min): 59 min     Short Term Goals: Week 1:  OT Short Term Goal 1 (Week 1): Pt will initate and complete UB bathing with min assist and no more than mod instructional cueing.  OT Short Term Goal 1 - Progress (Week 1): Not met OT Short Term Goal 2 (Week 1): Pt will perform LB bathing with mod assist sit to stand.   OT Short Term Goal 2 - Progress (Week 1): Not met OT Short Term Goal 3 (Week 1): Pt will maintain static sitting balance EOB with supervison in preparation for selfcare tasks and functional transfers.   OT Short Term Goal 3 - Progress (Week 1): Not met OT Short Term Goal 4 (Week 1): Pt will complete stand pivot transfer to 3:1 with mod assist 2 consecutive sessions.   OT Short Term Goal 4 - Progress (Week 1): Not met OT Short Term Goal 5 (Week 1): Pt will tolerate 70 degrees AAROM at the left shoulder with pain less than 4/10.  OT Short Term Goal 5 - Progress (Week 1): Met Week 2:  OT Short Term Goal 1 (Week 2): Pt will initate and complete UB bathing with min assist and no more than mod instructional cueing.  OT Short Term Goal 2 (Week 2): Pt will perform LB bathing with mod assist sit to stand.   OT Short Term Goal 3 (Week 2): Pt will maintain static sitting balance EOB with supervison in preparation for selfcare tasks and functional transfers.   OT Short Term Goal 4 (Week 2): Pt will complete stand pivot transfer to 3:1 with mod assist 2 consecutive sessions.   _0    Skilled Therapeutic Interventions/Progress Updates:   Skilled OT session completed with focus on initiation/self sequencing, left body awareness, and visual scanning during self care completion. Pt was in bed with husband Shelly Mcdaniel present at start of session. With encouragement, pt was agreeable to participate in ADLs w/c  level at sink. Pt maintained static sitting balance at EOB and close supervision for 2 minutes prior to transfer. Stand pivot<w/c completed with overall Mod A to left side and manual facilitation for weight shifting. .Pt required mod-max cues for sequencing, memory, and self initiation during dressing/bathing. Pt often mistaking right leg for left leg with multimodal cues provided to improve left sided awareness. Left arm was propped up on pillow for support during tx. At end of session pt was left with spouse and half lap tray in place, all needs within reach.     Therapy Documentation Precautions:  Precautions Precautions: Fall Precaution Comments: painful left arm with moderate left hemiparesis  Required Braces or Orthoses: Other Brace/Splint Other Brace/Splint: resting hand splint for the left hand at night Restrictions Weight Bearing Restrictions: No   Pain: C/o left arm pain when unsupported; left arm supported during tx  Pain Assessment Pain Assessment: No/denies pain ADL    See Function Navigator for Current Functional Status.   Therapy/Group: Individual Therapy  Randal Goens A Kwabena Strutz 04/18/2016, 12:54 PM

## 2016-04-18 NOTE — Progress Notes (Signed)
Speech Language Pathology Daily Session Note  Patient Details  Name: Shelly Mcdaniel MRN: [id number] Date of Birth: Apr 18, 1956  Today's Date: 04/18/2016 SLP Individual Time: 0802-0900 SLP Individual Time Calculation (min): 58 min   Short Term Goals:Week 2: SLP Short Term Goal 1 (Week 2): Pt will sustain her attention to basic, familiar tasks for 1 minute intervals with mod assist multimodal cues for redirection.   SLP Short Term Goal 2 (Week 2): Pt will intiate and complete basic, familiar tasks with no more than 5 verbal cues for sequencing and organization.  SLP Short Term Goal 3 (Week 2): Pt will utilize external aids to orient to place, date, and situation with min assist verbal and visual cues.   SLP Short Term Goal 4 (Week 2): Pt will utilize call bell to convey needs and wants to caregivers with max assist multimodal cues.  SLP Short Term Goal 5 (Week 2): Pt will consume regular textures and thin liquids with mod assist for swallow initiation and clearance of oral cavity.  Skilled Therapeutic Interventions:  Pt was seen for skilled ST targeting goals for dysphagia and cognition.  Pt was noted with improved task initiation today in comparison to previous therapy sessions and was able to reposition herself in bed with min assist verbal cues.  Initially, pt was also able to initiate self feeding with min assist; however as task progress pt became more fatigued and needed up to max assist verbal cues for task initiation.  Pt remains very distracted to both internal and external stimuli and was able to sustain her attention to task in a quiet environment for 30 second intervals with mod assist verbal cues for redirection.  Pt consumed regular textures and thin liquids with mod-max assist verbal cues to attend to bolus and clear solids from the oral cavity.  No overt s/s of aspiration were evident with solids or liquids although pt did need consistent use of liquid wash to facilitate clearance of dry  breads.  Pt was left in bed with bed alarm set and call bell within reach.  Continue per current plan of care.       Function:  Eating Eating   Modified Consistency Diet: No Eating Assist Level: Supervision or verbal cues;More than reasonable amount of time;Set up assist for   Eating Set Up Assist For: Opening containers;Cutting food       Cognition Comprehension Comprehension assist level: Understands basic 50 - 74% of the time/ requires cueing 25 - 49% of the time  Expression   Expression assist level: Expresses basic 50 - 74% of the time/requires cueing 25 - 49% of the time. Needs to repeat parts of sentences.  Social Interaction Social Interaction assist level: Interacts appropriately 25 - 49% of time - Needs frequent redirection.  Problem Solving Problem solving assist level: Solves basic 25 - 49% of the time - needs direction more than half the time to initiate, plan or complete simple activities  Memory Memory assist level: Recognizes or recalls 25 - 49% of the time/requires cueing 50 - 75% of the time    Pain Pain Assessment Pain Assessment: No/denies pain  Therapy/Group: Individual Therapy  Shelly Mcdaniel, Shelly Mcdaniel 04/18/2016, 11:01 AM

## 2016-04-19 ENCOUNTER — Inpatient Hospital Stay (HOSPITAL_COMMUNITY): Payer: BC Managed Care – PPO | Admitting: Occupational Therapy

## 2016-04-19 ENCOUNTER — Inpatient Hospital Stay (HOSPITAL_COMMUNITY): Payer: BC Managed Care – PPO | Admitting: Physical Therapy

## 2016-04-19 ENCOUNTER — Inpatient Hospital Stay (HOSPITAL_COMMUNITY): Payer: BC Managed Care – PPO | Admitting: Speech Pathology

## 2016-04-19 LAB — GLUCOSE, CAPILLARY
GLUCOSE-CAPILLARY: 359 mg/dL — AB (ref 65–99)
GLUCOSE-CAPILLARY: 103 mg/dL — AB (ref 65–99)
GLUCOSE-CAPILLARY: 158 mg/dL — AB (ref 65–99)
Glucose-Capillary: 117 mg/dL — ABNORMAL HIGH (ref 65–99)

## 2016-04-19 MED ORDER — PREGABALIN 25 MG PO CAPS
25.0000 mg | ORAL_CAPSULE | Freq: Two times a day (BID) | ORAL | Status: DC
  Administered 2016-04-19 – 2016-04-20 (×2): 25 mg via ORAL
  Filled 2016-04-19 (×2): qty 1

## 2016-04-19 NOTE — Progress Notes (Signed)
Physical Therapy Session Note  Patient Details  Name: Shelly Mcdaniel MRN: [id number] Date of Birth: [birth year]  Today's Date: 04/19/2016 PT Individual Time: 0900-0954 PT Individual Time Calculation (min): 54 min    Short Term Goals: Week 2:  PT Short Term Goal 1 (Week 2): Patient will perform sit<>stand transfers with min Assist  PT Short Term Goal 2 (Week 2): patient will perform squat pivot with min assit from PT.  PT Short Term Goal 3 (Week 2): patient will perform gait x 7040ft with LRAD and mod Assist.  PT Short Term Goal 4 (Week 2): Patient will be able sustain attent to task for >5 minutes with only min cues.  PT Short Term Goal 5 (Week 2): Patient will performed WC mobility with supervision Assist x 14050ft   Skilled Therapeutic Interventions/Progress Updates:    Patient in bed appearing fatigued upon arrival after SLP session with husband initially present then departing. Performed rolling to R and L with use of rails and cues for sequencing and technique with min A for LB dressing with total A, total A to don shoes and L AFO, and performed bed mobility with mod A and cues for sequencing and technique. Performed squat pivot transfers with mod A and stand pivot transfers with max A and max cues for sequencing. Patient propelled wheelchair from room > therapy gym via R hemi technique with max cues for avoiding obstacles on L and sequencing due to drifting to L and supervision-min A overall. Engaged in sit <> stand transfer training from mat table with steady assist and cues for hand placement for step taps to 4" step using RLE only with RUE support on quad cane for LLE stance stability but patient unable to tolerate standing due to painful L shoulder. Patient provided with prolonged rest break in R sidelying and LUE supported on pillow and fell asleep. Patient agreeable to attempt NuStep level 1 using BLE and RUE only x 1.5 min requiring max-total multimodal cues to sustain attention to task  approx 30 seconds. Terminated due to L heel pain. Patient returned to bed to rest until next therapy and left semi reclined with call bell in reach, bed alarm on, and 3 rails up.   Therapy Documentation Precautions:  Precautions Precautions: Fall Precaution Comments: painful left arm with moderate left hemiparesis  Required Braces or Orthoses: Other Brace/Splint Other Brace/Splint: resting hand splint for the left hand at night Restrictions Weight Bearing Restrictions: (P) No Pain Pain Assessment Pain Assessment: 0-10 Pain Score: 3  Pain Type: Chronic pain Pain Location: Shoulder Pain Orientation: Left Pain Descriptors / Indicators: Aching Pain Onset: On-going Pain Intervention(s): Repositioned;Rest   See Function Navigator for Current Functional Status.   Therapy/Group: Individual Therapy  Kerney ElbeVarner, Rakwon Letourneau A 04/19/2016, 9:58 AM

## 2016-04-19 NOTE — Progress Notes (Signed)
Nutrition Follow-up  DOCUMENTATION CODES:   Not applicable  INTERVENTION:  Provide Glucerna Shake po TID, each supplement provides 220 kcal and 10 grams of protein.  Continue nocturnal tube feeds of Jevity 1.2 at goal rate of 74m/hr x 12 hours (5pm-5am)via Cortrak NGT with 312mProstat once daily toprovide 1360kcal (76% of needs), 69grams of protein (95% of protein needs), and 63886mf H2O.   Continue free water flushes of 100 ml QID per tube.  Provide Magic cup between meals, each supplement provides 290 kcal and 9 grams of protein.  Encourage adequate PO intake.   NUTRITION DIAGNOSIS:   Inadequate oral intake related to lethargy/confusion, nausea as evidenced by  (No meal intake x 4 days); ongoing  GOAL:   Patient will meet greater than or equal to 90% of their needs; met via TF  MONITOR:   PO intake, Supplement acceptance, Labs, Weight trends, TF tolerance, Skin, I & O's  REASON FOR ASSESSMENT:   Consult Enteral/tube feeding initiation and management  ASSESSMENT:   [age]o female PMHx DM, HLD, HTN. She experienced a Stroke in August. She presented yesterday with AMS and dysarthria. MRI showed middle cerebellar peduncle likely representing early subcute ischemia. Admitted for comprehensive rehab.  NGT in place.   Pt continues to receive nocturnal tube feeds and has been tolerating it. RD consulted for poor po intake and to discuss food choices with pt and husband. Meal completion has been varied from 5-75%. Husband reports pt has been refusing most foods at meals. Husband has been usually ordering the meals for the pt. Discussed the importance of adequate protein needs and provided examples of high protein food items. Encouraged pt to eat whatever she would like to aid in adequate po intake. Encouraged husband to bring in foods from home/outside that pt prefers to eat. Pt currently has Glucerna ordered and has been only consuming ~25% of each supplement. Pt and husband  encouraged to try Magic cup as pt reports liking ice cream. Nursing staff to provide in between meals.   Labs and medications reviewed. CBG's 103-359 mg/dL.   Diet Order:  Diet Carb Modified Fluid consistency: Thin; Room service appropriate? Yes  Skin:  Reviewed, no issues  Last BM:  11/6  Height:   Ht Readings from Last 1 Encounters:  04/08/16 _0  (1.702 m)    Weight:   Wt Readings from Last 1 Encounters:  04/19/16 146 lb 12.5 oz (66.6 kg)    Ideal Body Weight:  61.36 kg  BMI:  Body mass index is 22.99 kg/m.  Estimated Nutritional Needs:   Kcal:  1801224-4975rotein:  73-88g (1-1.2 g/kg bw)  Fluid:  1.8-2 Liters  EDUCATION NEEDS:   No education needs identified at this time  SteCorrin ParkerS, RD, LDN Pager # [phone] hours/ weekend pager # [phone]

## 2016-04-19 NOTE — Progress Notes (Signed)
[facility]     PROGRESS NOTE    Subjective/Complaints:    ROS limited due to cognition  Objective: Vital Signs: Blood pressure 133/83, pulse 81, temperature 98.1 F (36.7 C), temperature source Oral, resp. rate 18, height 5\' 7"  (1.702 m), weight 66.6 kg (146 lb 12.5 oz), SpO2 92 %. No results found. No results for input(s): WBC, HGB, HCT, PLT in the last 72 hours. No results for input(s): NA, K, CL, GLUCOSE, BUN, CREATININE, CALCIUM in the last 72 hours.  Invalid input(s): CO CBG (last 3)   Recent Labs  04/18/16 1712 04/18/16 2113 04/19/16 0643  GLUCAP 186* 215* 359*    Wt Readings from Last 3 Encounters:  04/19/16 66.6 kg (146 lb 12.5 oz)  04/05/16 73.3 kg (161 lb 9.6 oz)  01/15/16 75.2 kg (165 lb 12.8 oz)    Physical Exam:  Constitutional: lying comfortably in bed HENT:  Head: Normocephalic and atraumatic.  Right Ear: External ear normal.  Left Ear: External ear normal.  Mouth/Throat: Oropharynx is clear and moist.  Eyes: Conjunctivae and EOM are normal. Pupils are equal, round, and reactive to light. No scleral icterus.  Neck: No JVD present. No tracheal deviation present. No thyromegaly present.  Cardiovascular: Normal rate, regular rhythm and intact distal pulses.  Exam reveals no friction rub.   No murmur heard. Respiratory: Effort normal. No respiratory distress. She has no wheezes. She has no rales. She exhibits no tenderness.  GI: She exhibits no distension. There is no tenderness. There is no rebound.  Musculoskeletal: She exhibits no edema. Tenderness:.No pain with RUE ROM, Neurological: She is anxious/confused. She displays abnormal reflexes (LUE and LLE 3+). A cranial nerve deficits (left central 7 and tongue deviation.) is present. She exhibits abnormal muscle tone (MAS Left shoulder adductors 2-3/4. biceps 2/4, wrist flexors 2/4. 1/4 knee flexors/plantrar flexors. 4-5 beats of clonus at left ankle--no change). Pain  with attempted finger ext and wrist ext and elbow ext Coordination normal.  RUE and RLE 4 to 4+/5. LUE underlying 1/4 deltoid, biceps, triceps, wrist and finger flexors.  LLE 1+ HF, KE and 0/5 ADF/PF.--no change   Skin: No rash noted. No erythema. No pallor.  Psychiatric: She has a normal mood and affect.  Finger flexors are relaxed today, pain with elbow ROM, tone related  Assessment/Plan: 1. 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 Physiatrist is providing close team supervision and 24 hour management of active medical problems listed below. Physiatrist and rehab team continue to assess barriers to discharge/monitor patient progress toward functional and medical goals.  Function:  Bathing Bathing position   Position: Wheelchair/chair at sink  Bathing parts Body parts bathed by patient: Right lower leg, Left upper leg, Right upper leg, Front perineal area, Abdomen, Chest, Left arm Body parts bathed by helper: Right arm, Buttocks, Left lower leg, Back  Bathing assist Assist Level: Touching or steadying assistance(Pt > 75%)      Upper Body Dressing/Undressing Upper body dressing   What is the patient wearing?: Pull over shirt/dress     Pull over shirt/dress - Perfomed by patient: Thread/unthread right sleeve, Pull shirt over trunk Pull over shirt/dress - Perfomed by helper: Thread/unthread left sleeve, Put head through opening        Upper body assist Assist Level:  (Mod A)      Lower Body Dressing/Undressing Lower body dressing   What  is the patient wearing?: Underwear, Pants, Shoes, [employer] Hose Underwear - Performed by patient: Thread/unthread right underwear leg Underwear - Performed by helper: Thread/unthread left underwear leg, Pull underwear up/down Pants- Performed by patient: Thread/unthread right pants leg Pants- Performed  by helper: Thread/unthread right pants leg, Thread/unthread left pants leg, Pull pants up/down Non-skid slipper socks- Performed by patient: Don/doff right sock (doff) Non-skid slipper socks- Performed by helper: Don/doff right sock, Don/doff left sock     Shoes - Performed by patient: Don/doff right shoe Shoes - Performed by helper: Don/doff left shoe, Fasten right, Fasten left   AFO - Performed by helper: Don/doff left AFO   TED Hose - Performed by helper: Don/doff right TED hose, Don/doff left TED hose  Lower body assist Assist for lower body dressing: Touching or steadying assistance (Pt > 75%)      Toileting Toileting Toileting activity did not occur: No continent bowel/bladder event   Toileting steps completed by helper: Adjust clothing prior to toileting, Performs perineal hygiene, Adjust clothing after toileting Toileting Assistive Devices: Grab bar or rail  Toileting assist Assist level: Two helpers   Transfers Chair/bed transfer   Chair/bed transfer method: Squat pivot Chair/bed transfer assist level: Moderate assist (Pt 50 - 74%/lift or lower) Chair/bed transfer assistive device: Armrests, Bedrails     Locomotion Ambulation     Max distance: 20 Assist level: Moderate assist (Pt 50 - 74%)   Wheelchair   Type: Manual Max wheelchair distance: 14900ft  Assist Level: Supervision or verbal cues  Cognition Comprehension Comprehension assist level: Understands basic 50 - 74% of the time/ requires cueing 25 - 49% of the time  Expression Expression assist level: Expresses basic 50 - 74% of the time/requires cueing 25 - 49% of the time. Needs to repeat parts of sentences.  Social Interaction Social Interaction assist level: Interacts appropriately 50 - 74% of the time - May be physically or verbally inappropriate.  Problem Solving Problem solving assist level: Solves basic 50 - 74% of the time/requires cueing 25 - 49% of the time  Memory Memory assist level: Recognizes or  recalls 50 - 74% of the time/requires cueing 25 - 49% of the time   Medical Problem List and Plan: 1.  Left spastic hemiplegia/dysarthria secondary to right pontine infarct with recent extension             -continue CIR PT, OT, SLP team conf in am                2.  DVT Prophylaxis/Anticoagulation: Lovenox. Monitor for any bleeding episode  3. Pain Management with recent fall and persistent rib cage pain. Also has had prominent spasms/cramping at night  -Lidoderm patch increase to 3 patches Baclofen10 mg 3 times a day helping with tone, Robaxin as needed.  -resumed lyrica (home medication) at 75mg  qhs (was using 75mg  in am and 150mg  in pm at home)--will cont 25mg  at husbands request to see if mentation will clear further 4. Mood: Zoloft 50 mg daily, 5. Neuropsych: This patient is not capable of making decisions on her own behalf.   -HS confusion/altered sleep/wake cycle which has carried over from acute to admission here.  -increase melatonin to 6mg  QHS (home medication)  -keep sleep chart  -need to re-establish sleep wake cycle  -all labs reviewed today  -ua/ucx negative 6. Skin/Wound Care: Routine skin checks 7. 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 Hemoglobin A1c 6.7.  Check blood sugars before meals and at bedtime  -sugars low given sparce po intake  -increase levemir 30U qhs,   -Start Glucophage 1000 mg BID 11/6 CBG (last 3)   Recent Labs  04/18/16 1712 04/18/16 2113 04/19/16 0643  GLUCAP 186* 215* 359*    9.Hypertension with bouts of  orthostasis. Presently on Norvasc 5 mg daily. Patient also on lisinopril 40 mg daily as well as Accuretic prior to admission. Resume as needed 10 Hyperlipidemia .Zocor 40 mg daily 12. Spastic left hemiparesis, acute on chronic             -baclofen  just resumed , Elbow flexor spasticity better, still has pain with PROM FFs             -consider botox injections, discussed with husband who is comfortable with that             -continue splinting/ROM- improving  But fluctuating in terms of location elbow vs fingers- Elbow fexors more cosistently spastic than wrist flexors 13. Poor po intake/altered mentation  -NGT placed- TF at noc ,   -continue TF at night---encourage PO during the day,  -anxiety/cognitive component most prominently  14.Basilar stenosis Plavix per neuro     LOS (Days) 11 A FACE TO FACE EVALUATION WAS PERFORMED  KIRSTEINS,ANDREW E 04/19/2016 7:27 AM

## 2016-04-19 NOTE — Progress Notes (Signed)
Speech Language Pathology Daily Session Note  Patient Details  Name: Shelly SimmerJody D Radice MRN: [id number] Date of Birth: [birth year]  Today's Date: 04/19/2016 SLP Individual Time: 0805-0900 SLP Individual Time Calculation (min): 55 min   Short Term Goals: Week 2: SLP Short Term Goal 1 (Week 2): Pt will sustain her attention to basic, familiar tasks for 1 minute intervals with mod assist multimodal cues for redirection.   SLP Short Term Goal 2 (Week 2): Pt will intiate and complete basic, familiar tasks with no more than 5 verbal cues for sequencing and organization.  SLP Short Term Goal 3 (Week 2): Pt will utilize external aids to orient to place, date, and situation with min assist verbal and visual cues.   SLP Short Term Goal 4 (Week 2): Pt will utilize call bell to convey needs and wants to caregivers with max assist multimodal cues.  SLP Short Term Goal 5 (Week 2): Pt will consume regular textures and thin liquids with mod assist for swallow initiation and clearance of oral cavity.  Skilled Therapeutic Interventions:  Pt was seen for skilled ST targeting goals for cognition and dysphagia.  Pt was noted with increased lethargy this morning in comparison to previous therapy sessions and reported that she did not sleep well last night.  As a result, pt needed total assist for initiation of self feeding and was only able to focus her attention to tasks for fleeting periods of time.  Pt with increased oral holding due to poor attention to boluses and needed max verbal cues to clear solids from the oral cavity; therefore, breakfast meal was abandoned for pt safety.  Pt was noted with somewhat improved alertness when washing her face with a cold wash cloth and brushing her teeth but still needed max assist multimodal cues for task initiation and sequencing.  Pt very perseverative in her task organization and needed heavy cues to progress from one task component to another.  Pt also verbalizing poor awareness  of current physical and cognitive limitations post acute CVA and needed max assist verbal cues to identify at least 1 deficit post stroke.   Pt's language more confused as a result of lethargy; however, pt was oriented to place and situation with supervision question cues.   Pt's husband was present at the end of today's therapy session and was educated regarding pt's current cognitive limitations and their impact on her ability to complete even basic self care tasks.  Discussed techniques to maximize pt's attention to task and task initiation.  All questions were answered to his satisfaction at this time.  Pt was left in bed and handed off to PT.  Continue per current plan of care.       Function:  Eating Eating   Modified Consistency Diet: No Eating Assist Level: More than reasonable amount of time;Set up assist for;Supervision or verbal cues           Cognition Comprehension Comprehension assist level: Understands basic 75 - 89% of the time/ requires cueing 10 - 24% of the time  Expression   Expression assist level: Expresses basic 50 - 74% of the time/requires cueing 25 - 49% of the time. Needs to repeat parts of sentences.  Social Interaction Social Interaction assist level: Interacts appropriately 25 - 49% of time - Needs frequent redirection.  Problem Solving Problem solving assist level: Solves basic 25 - 49% of the time - needs direction more than half the time to initiate, plan or complete simple activities  Memory  Memory assist level: Recognizes or recalls 25 - 49% of the time/requires cueing 50 - 75% of the time    Pain Pain Assessment Pain Assessment: No/denies pain  Therapy/Group: Individual Therapy  Hairo Garraway, Melanee SpryNicole L 04/19/2016, 11:16 AM

## 2016-04-19 NOTE — Progress Notes (Signed)
Occupational Therapy Session Note  Patient Details  Name: Shelly Mcdaniel MRN: [id number] Date of Birth: 12-03-1955  Today's Date: 04/19/2016 OT Individual Time: 1100-1200 OT Individual Time Calculation (min): 60 min     Short Term Goals: Week 2:  OT Short Term Goal 1 (Week 2): Pt will initate and complete UB bathing with min assist and no more than mod instructional cueing.  OT Short Term Goal 2 (Week 2): Pt will perform LB bathing with mod assist sit to stand.   OT Short Term Goal 3 (Week 2): Pt will maintain static sitting balance EOB with supervison in preparation for selfcare tasks and functional transfers.   OT Short Term Goal 4 (Week 2): Pt will complete stand pivot transfer to 3:1 with mod assist 2 consecutive sessions.    Skilled Therapeutic Interventions/Progress Updates:   1:1 OT session focused on modified bathing/dressing, L side attention, functional use of L Ue, transfer family training, and sit<>stands. OT provided transfer training to pt's spouse, who then assisted pt with stand-pivot transfer from bed<>w/c with min cues for technique. Mod A stand-pivot from w/c to tub bench using grab bars. Pt required max multimodal cues to maintain midline posture and attend to L side. Pt able to follow one-step commands during bathing tasks with mod multimodal cues to attend to L side. Hand-over-hand assist on L UE to grasp wash-cloth and bring across midline to wash R arm. NMR techniques incorporated during bathing/dressing tasks. Min A sit<>stand, progressing from Min> Mod A to maintain standing balance while OT pulled pants over hips. Max A LB dressing. Pt able to verbalize sequence of donning shirt using hemi-techniques, but unable to initiate task without max multimodal cues and overall mod A. Pt left seated in recliner chair at end of session with spouse present and needs met.   Therapy Documentation Precautions:  Precautions Precautions: Fall Precaution Comments: painful left arm with  moderate left hemiparesis  Required Braces or Orthoses: Other Brace/Splint Other Brace/Splint: resting hand splint for the left hand at night Restrictions Weight Bearing Restrictions: No Pain: Pain Assessment Pain Assessment: Faces Faces Pain Scale: Hurts little more Pain Type: Chronic pain Pain Location: Shoulder Pain Orientation: Left Pain Descriptors / Indicators: Aching Pain Onset: With Activity Pain Intervention(s): Repositioned     See Function Navigator for Current Functional Status.   Therapy/Group: Individual Therapy  Valma Cava 04/19/2016, 4:15 PM

## 2016-04-20 ENCOUNTER — Inpatient Hospital Stay (HOSPITAL_COMMUNITY): Payer: BC Managed Care – PPO | Admitting: Speech Pathology

## 2016-04-20 ENCOUNTER — Encounter: Payer: BC Managed Care – PPO | Admitting: Occupational Therapy

## 2016-04-20 ENCOUNTER — Ambulatory Visit: Payer: BC Managed Care – PPO | Admitting: Physical Therapy

## 2016-04-20 ENCOUNTER — Inpatient Hospital Stay (HOSPITAL_COMMUNITY): Payer: BC Managed Care – PPO

## 2016-04-20 ENCOUNTER — Inpatient Hospital Stay (HOSPITAL_COMMUNITY): Payer: BC Managed Care – PPO | Admitting: Physical Therapy

## 2016-04-20 LAB — GLUCOSE, CAPILLARY
GLUCOSE-CAPILLARY: 333 mg/dL — AB (ref 65–99)
Glucose-Capillary: 196 mg/dL — ABNORMAL HIGH (ref 65–99)
Glucose-Capillary: 160 mg/dL — ABNORMAL HIGH (ref 65–99)
Glucose-Capillary: 176 mg/dL — ABNORMAL HIGH (ref 65–99)

## 2016-04-20 MED ORDER — JEVITY 1.2 CAL PO LIQD
ORAL | Status: AC
  Filled 2016-04-20: qty 237

## 2016-04-20 NOTE — Progress Notes (Signed)
Speech Language Pathology Daily Session Note  Patient Details  Name: Shelly Mcdaniel MRN: [id number] Date of Birth: 01-19-1956  Today's Date: 04/20/2016 SLP Individual Time: 0935-1030 SLP Individual Time Calculation (min): 55 min   Short Term Goals:Week 2: SLP Short Term Goal 1 (Week 2): Pt will sustain her attention to basic, familiar tasks for 1 minute intervals with mod assist multimodal cues for redirection.   SLP Short Term Goal 2 (Week 2): Pt will intiate and complete basic, familiar tasks with no more than 5 verbal cues for sequencing and organization.  SLP Short Term Goal 3 (Week 2): Pt will utilize external aids to orient to place, date, and situation with min assist verbal and visual cues.   SLP Short Term Goal 4 (Week 2): Pt will utilize call bell to convey needs and wants to caregivers with max assist multimodal cues.  SLP Short Term Goal 5 (Week 2): Pt will consume regular textures and thin liquids with mod assist for swallow initiation and clearance of oral cavity.  Skilled Therapeutic Interventions:  Pt was seen for skilled ST targeting cognitive goals.  Pt needed min assist verbal cues for redirection when being given 1 step directions during bed mobility.  When multi-steps were attempted, pt needed up to max assist verbal cues for redirection.  SLP facilitated the session with a basic sorting task targeting task initiation and sustained attention to task.  Pt was noted with improved initiation today in comparison to previous therapy session and only needed intermittent min assist level verbal cues to initiate task.  Pt was able to sustain her attention to task for up to 1 minute intervals with mod assist verbal cues for redirection.  Pt needed up to max assist multimodal cues for task organization when putting materials away due to perseveration on previous task's goals.  Pt was left in bed with bed alarm set and call bell within reach.  Continue per current plan of care.        Function:  Eating Eating                 Cognition Comprehension Comprehension assist level: Understands basic 75 - 89% of the time/ requires cueing 10 - 24% of the time  Expression   Expression assist level: Expresses basic 75 - 89% of the time/requires cueing 10 - 24% of the time. Needs helper to occlude trach/needs to repeat words.  Social Interaction Social Interaction assist level: Interacts appropriately 25 - 49% of time - Needs frequent redirection.  Problem Solving Problem solving assist level: Solves basic 25 - 49% of the time - needs direction more than half the time to initiate, plan or complete simple activities  Memory Memory assist level: Recognizes or recalls 25 - 49% of the time/requires cueing 50 - 75% of the time    Pain Pain Assessment Pain Assessment: No/denies pain  Therapy/Group: Individual Therapy  Haden Cavenaugh, Melanee SpryNicole L 04/20/2016, 10:28 AM

## 2016-04-20 NOTE — Progress Notes (Signed)
Physical Therapy Session Note  Patient Details  Name: Shelly Mcdaniel MRN: [id number] Date of Birth: 1955/07/04  Today's Date: 04/20/2016 PT Individual Time: 1405-1500 PT Individual Time Calculation: 55min       Short Term Goals: Week 2:  PT Short Term Goal 1 (Week 2): Patient will perform sit<>stand transfers with min Assist  PT Short Term Goal 2 (Week 2): patient will perform squat pivot with min assit from PT.  PT Short Term Goal 3 (Week 2): patient will perform gait x 5240ft with LRAD and mod Assist.  PT Short Term Goal 4 (Week 2): Patient will be able sustain attent to task for >5 minutes with only min cues.  PT Short Term Goal 5 (Week 2): Patient will performed WC mobility with supervision Assist x 15950ft   Skilled Therapeutic Interventions/Progress Updates:     Patient receives sitting up in bed and agreeable to PT.   Supine>sit with mod assist at the trunk due to pain in the L shoulder.   Stand pivot transfer with [facility] from [facility]. Mod cues for proper UE placement.   WC mobility in hall x 14460ft with supervision-min assist with one instance of requiring hand over hand guidance to find wheel rim after stopping from distraction.   Gait training in hall with R UE support on rail with min assist x 8035ft. PT provided manual facilitation of trunk/pelvic rotation. Rotation. Additional gait [facility] with mod-max Assist from PT to prevent lateral/posterior LOB. Max cues for proper sequencing and step-to gait pattern.   Patient performed stand pivot transfer to R x 2 for WC<>mat transfer with min assist and mod cues for proper sequencing. Supine<>sit with min Assist to manage the LLE. Supine NMR. SAQ, bridges, hip abduciton PNF dynamic reversals and stabilizing reversals for pelvic rotation. Mod-max cues for proper sequencing of movenments   Returned to room  And left sitting in [facility] with call bell in reach.      Therapy Documentation Precautions:   Precautions Precautions: Fall Precaution Comments: painful left arm with moderate left hemiparesis  Required Braces or Orthoses: Other Brace/Splint Other Brace/Splint: resting hand splint for the left hand at night Restrictions Weight Bearing Restrictions: No General:   Vital Signs: Therapy Vitals Temp: 97.4 F (36.3 C) Pulse Rate: (!) 103 Resp: 16 BP: 113/87 Patient Position (if appropriate): Sitting Oxygen Therapy SpO2: 100 % O2 Device: Not Delivered   See Function Navigator for Current Functional Status.   Therapy/Group: Individual Therapy  Golden Popustin E Ruie Sendejo 04/20/2016, 2:46 PM

## 2016-04-20 NOTE — Patient Care Conference (Signed)
Inpatient RehabilitationTeam Conference and Plan of Care Update Date: 04/20/2016   Time: 10:40 AM    Patient Name: Shelly Mcdaniel      Medical Record Number: [id number]  Date of Birth: 1956/01/24 Sex: Female         Room/Bed: 4W15C/4W15C-01 Payor Info: Payor: [employer] / Plan: [facility] [employer] [facility] / Product Type: *No Product type* /    Admitting Diagnosis: CVA  Admit Date/Time:  04/08/2016  5:54 PM Admission Comments: No comment available   Primary Diagnosis:  Right pontine cerebrovascular accident [facility](HCC) Principal Problem: Right pontine cerebrovascular accident [facility](HCC)  Patient Active Problem List   Diagnosis Date Noted  . Orthostatic hypotension 04/08/2016  . Muscle cramps 04/08/2016  . 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 Cerebral thrombosis with cerebral infarction 04/06/2016  . Basilar artery stenosis   . Dysarthria 04/05/2016  . History of CVA with residual deficit 04/05/2016  . Sinus tachycardia 04/05/2016  . Acute encephalopathy 04/05/2016  . Chronic pain 04/05/2016  . Hyperlipidemia 04/05/2016  . Ischemic stroke (HCC)   . 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 TIA (transient ischemic attack) 11/07/2014  . Uncontrolled hypertension 11/07/2014  . DM II (diabetes mellitus, type II), controlled (HCC) 11/07/2014    Expected Discharge Date: Expected Discharge Date: 04/29/16  Team Members Present: Physician leading conference: Dr. Claudette LawsAndrew Kirsteins Social Worker Present: Dossie DerBecky Melane Windholz, LCSW Nurse Present: Carmie EndAngie Joyce, RN PT Present: Grier RocherAustin Tucker, PT;Caroline Adriana [place], Nita SicklePT;Rodney Wishart, PT OT Present: Perrin MalteseJames McGuire, OT SLP Present: Jackalyn LombardNicole Page, SLP PPS Coordinator present : Tora DuckMarie Noel, RN, CRRN     Current Status/Progress Goal Weekly Team Focus  Medical   Sustained attn and initiation reduced but improving, mod A transfers, Mod Max ADL  improve oral intake  improve cognition,     Bowel/Bladder   incontinent of bladder and bowel   managed max assist by [place]   monitor for urinary retention   Swallow/Nutrition/ Hydration   max assist verbal cues for attention to boluses, continues to need full supervision during meals due to cognition   min assist, downgraded due to slow progress   attention to boluses, intiation and sequencing of self feeding   ADL's   Mod A tub/shower transfer, Mod/Max A bathing/dressing, improved attention but continues to require mod/max cues  min A overall (transfers, bathign and dressing ), mod A for functional use of left UE  sustained-selective attention, L side attention, functional use of L UE, NMR, transfer training, ADL participation   Mobility   Mod Assist, bed mobility, transfer. Max assist withy gait training, min assist - supervision with WC mobility  Min assist transfer, and bed mobility. Mod assist Gait for household distances, Supervision Assist with WC mobility.   increased Attention, improved awareness of the L side, increased strength, balance, enduracne in BLE    Communication             Safety/Cognition/ Behavioral Observations  mod-max assist for basic   min assist   continue to address task initiation, organization, sustained attention to task, orientation   Pain   less pain on left shoulder today, pain medication as need it  <2  assess and monitor pain level   Skin   normal limits  assess and monitor every shift  assess and monitor      *See Care Plan and progress notes for long and short-term goals.  Barriers to Discharge: see above    Possible  Resolutions to Barriers:  reduce sedating meds, consider methylphenidate    Discharge Planning/Teaching Needs:  Husband is here daily and participating in care with pt. Plans on going back to OP therapies      Team Discussion:  Progressing slowly toward her goals of min assist level. Having shoulder pain-MD to inject with botox. Improvement in tone. Reducing lyrica  dose may be causing confusion. Not having to I & O cath now. Regular diet poor intake due to cognition. Husband here to participate in therapies.  Revisions to Treatment Plan:  None   Continued Need for Acute Rehabilitation Level of Care: The patient requires daily medical management by a physician with specialized training in physical medicine and rehabilitation for the following conditions: Daily direction of a multidisciplinary physical rehabilitation program to ensure safe treatment while eliciting the highest outcome that is of practical value to the patient.: Yes Daily medical management of patient stability for increased activity during participation in an intensive rehabilitation regime.: Yes Daily analysis of laboratory values and/or radiology reports with any subsequent need for medication adjustment of medical intervention for : Neurological problems;Nutritional problems;Other  Lucy ChrisDupree, Keldric Poyer G 04/20/2016, 3:44 PM

## 2016-04-20 NOTE — Progress Notes (Signed)
Occupational Therapy Session Note  Patient Details  Name: Shelly Mcdaniel MRN: [id number] Date of Birth: Nov 02, 1955  Today's Date: 04/20/2016 OT Individual Time: 1100-1200 OT Individual Time Calculation (min): 60 min     Short Term Goals: Week 2:  OT Short Term Goal 1 (Week 2): Pt will initate and complete UB bathing with min assist and no more than mod instructional cueing.  OT Short Term Goal 2 (Week 2): Pt will perform LB bathing with mod assist sit to stand.   OT Short Term Goal 3 (Week 2): Pt will maintain static sitting balance EOB with supervison in preparation for selfcare tasks and functional transfers.   OT Short Term Goal 4 (Week 2): Pt will complete stand pivot transfer to 3:1 with mod assist 2 consecutive sessions.    Skilled Therapeutic Interventions/Progress Updates:    Pt resting in bed upon arrival with husband present.  Pt departed at beginning of session in an effort to reduce distractions.  Pt engaged in BADL retraining including bathing/dressing with sit<>stand from w/c at sink.  Pt required max verbal cues to initiate all transitional movements and tasks.  Pt required max A for stand pivot transfer to w/c and max A for sit<>stand and standing balance to complete LB bathing/dressing tasks.  Pt perseverated on washing her face and neck and required max multimodal cues to transition to next segment of bathing tasks.  Pt required max multimodal cues to initiate dressing tasks.  Pt transitioned to therapy gym and engaged in peg board tasks with focus on attention to task, attention to task, sequencing, and following one step commands.  Pt required max verba cues to initiate tasks, sequence, attend to task and follow one step commands.  Pt returned to room and remained in w/c QRB in place and husband present.  Therapy Documentation Precautions:  Precautions Precautions: Fall Precaution Comments: painful left arm with moderate left hemiparesis  Required Braces or Orthoses: Other  Brace/Splint Other Brace/Splint: resting hand splint for the left hand at night Restrictions Weight Bearing Restrictions: No General:   Vital Signs:   Pain: Pain Assessment Pain Assessment: No/denies pain ADL:   Exercises:   Other Treatments:    See Function Navigator for Current Functional Status.   Therapy/Group: Individual Therapy  Rich BraveLanier, Leng Montesdeoca Chappell 04/20/2016, 12:22 PM

## 2016-04-21 ENCOUNTER — Inpatient Hospital Stay (HOSPITAL_COMMUNITY): Payer: BC Managed Care – PPO | Admitting: Occupational Therapy

## 2016-04-21 ENCOUNTER — Inpatient Hospital Stay (HOSPITAL_COMMUNITY): Payer: BC Managed Care – PPO | Admitting: Speech Pathology

## 2016-04-21 ENCOUNTER — Inpatient Hospital Stay (HOSPITAL_COMMUNITY): Payer: BC Managed Care – PPO | Admitting: Physical Therapy

## 2016-04-21 LAB — GLUCOSE, CAPILLARY
GLUCOSE-CAPILLARY: 286 mg/dL — AB (ref 65–99)
GLUCOSE-CAPILLARY: 276 mg/dL — AB (ref 65–99)
Glucose-Capillary: 85 mg/dL (ref 65–99)
Glucose-Capillary: 125 mg/dL — ABNORMAL HIGH (ref 65–99)

## 2016-04-21 MED ORDER — MEGESTROL ACETATE 400 MG/10ML PO SUSP
400.0000 mg | Freq: Two times a day (BID) | ORAL | Status: DC
  Administered 2016-04-21 – 2016-04-28 (×16): 400 mg via ORAL
  Filled 2016-04-21 (×18): qty 10

## 2016-04-21 MED ORDER — METHYLPHENIDATE HCL 5 MG PO TABS
5.0000 mg | ORAL_TABLET | Freq: Two times a day (BID) | ORAL | Status: DC
  Administered 2016-04-21 – 2016-04-22 (×3): 5 mg via ORAL
  Filled 2016-04-21 (×3): qty 1

## 2016-04-21 NOTE — Progress Notes (Signed)
Physical Therapy Session Note  Patient Details  Name: Shelly Mcdaniel MRN: [id number] Date of Birth: Dec 27, 1955  Today's Date: 04/21/2016 PT Individual Time: 0802-0900 PT Individual Time Calculation (min): 58 min   Short term goals Week 2:  PT Short Term Goal 1 (Week 2): Patient will perform sit<>stand transfers with min Assist  PT Short Term Goal 2 (Week 2): patient will perform squat pivot with min assit from PT.  PT Short Term Goal 3 (Week 2): patient will perform gait x 4340ft with LRAD and mod Assist.  PT Short Term Goal 4 (Week 2): Patient will be able sustain attent to task for >5 minutes with only min cues.  PT Short Term Goal 5 (Week 2): Patient will performed WC mobility with supervision Assist x 14050ft   Skilled Therapeutic Interventions/Progress Updates:     Pt received semirecumbent in bed and agreeable to PT. Supine>sit with mod Assist from PT with mod cues for initiation and management of the LLE Dressing while sitting EOB with max assist for time management. Sit<>stand with mod assist to come to standing and mod assist to prevent LOB in standing with QC. Brief management following incontinent episode.   Transfers including sit<>stand and squat pivot with consistent mod assist from PT and max cues for initiation and awareness of the L LE.   WC mobility through room and hall with min assist to locate wheel rim and awareness of the L side of his body.  Gait with HW forward/backwards x 5 ft each direction for encouraged use of LLE HS mod Assist from PT and max cues for sequencing.   Kinetron, 3 bouts x 1 minutes with max cues to improve initiation and awareness of the LLE. Mod cues for improved posture throughout eac bout.   Patient returned to room and left sitting in WC.     Therapy Documentation Precautions:  Precautions Precautions: Fall Precaution Comments: painful left arm with moderate left hemiparesis  Required Braces or Orthoses: Other Brace/Splint Other  Brace/Splint: resting hand splint for the left hand at night Restrictions Weight Bearing Restrictions: No General:   Vital Signs: Therapy Vitals Temp: 98.4 F (36.9 C) Temp Source: Oral Pulse Rate: 99 Resp: 18 BP: (!) 142/98 Patient Position (if appropriate): Lying Oxygen Therapy SpO2: 97 % O2 Device: Not Delivered   See Function Navigator for Current Functional Status.   Therapy/Group: Individual Therapy  Golden Popustin E Keahi Mccarney 04/21/2016, 7:57 AM

## 2016-04-21 NOTE — Progress Notes (Signed)
Occupational Therapy Session Note  Patient Details  Name: Shelly SimmerJody D Mcdaniel MRN: [id number] Date of Birth: 12/09/1955  Today's Date: 04/21/2016 OT Individual Time: 4098-11911133-1205 OT Individual Time Calculation (min): 32 min     Short Term Goals: Week 2:  OT Short Term Goal 1 (Week 2): Pt will initate and complete UB bathing with min assist and no more than mod instructional cueing.  OT Short Term Goal 2 (Week 2): Pt will perform LB bathing with mod assist sit to stand.   OT Short Term Goal 3 (Week 2): Pt will maintain static sitting balance EOB with supervison in preparation for selfcare tasks and functional transfers.   OT Short Term Goal 4 (Week 2): Pt will complete stand pivot transfer to 3:1 with mod assist 2 consecutive sessions.    Skilled Therapeutic Interventions/Progress Updates:    Pt worked on functional transfers from wheelchair to therapy mat with max assist stand pivot to the left.  Max demonstrational cueing to initiate sit to stand during transfer.  Once on the therapy mat worked on gently AROM and AAROM for the LUE.  Pt with increased trunk movements and inability to keep trunk steady and upright.  Pt unable to state why she was continuing to move and couldn't stay still.  Transferred pt to supine to help decreased trunk activity to concentrate more on the LUE.  She tolerated shoulder flexion 0-90 degrees before pain as well as 0-80 abduction.   Noted less tone in biceps and digit flexors with this session compared to last week.  With arm rotated externally during abduction she was only able to tolerate approximately 60-70 degrees.  No active movement initiate by pt at anytime when cued to assist therapist.  Transferred back to chair at end of session and then pt was positioned back in bed per spouse request.   Therapy Documentation Precautions:  Precautions Precautions: Fall Precaution Comments: painful left arm with moderate left hemiparesis  Required Braces or Orthoses: Other  Brace/Splint Other Brace/Splint: resting hand splint for the left hand at night Restrictions Weight Bearing Restrictions: No   Pain: Pain Assessment Pain Assessment: No/denies pain ADL: See Function Navigator for Current Functional Status.   Therapy/Group: Individual Therapy  Channon Brougher OTR/L 04/21/2016, 12:49 PM

## 2016-04-21 NOTE — Progress Notes (Signed)
Speech Language Pathology Daily Session Note  Patient Details  Name: Shelly Mcdaniel MRN: [id number] Date of Birth: 1956/04/30  Today's Date: 04/21/2016 SLP Individual Time: 1000-1100 SLP Individual Time Calculation (min): 60 min   Short Term Goals: Week 2: SLP Short Term Goal 1 (Week 2): Pt will sustain her attention to basic, familiar tasks for 1 minute intervals with mod assist multimodal cues for redirection.   SLP Short Term Goal 2 (Week 2): Pt will intiate and complete basic, familiar tasks with no more than 5 verbal cues for sequencing and organization.  SLP Short Term Goal 3 (Week 2): Pt will utilize external aids to orient to place, date, and situation with min assist verbal and visual cues.   SLP Short Term Goal 4 (Week 2): Pt will utilize call bell to convey needs and wants to caregivers with max assist multimodal cues.  SLP Short Term Goal 5 (Week 2): Pt will consume regular textures and thin liquids with mod assist for swallow initiation and clearance of oral cavity.  Skilled Therapeutic Interventions: Session 1: Pt seen for skilled SLP treatment with emphasis on cognition. Pt required max A for orientation to place and time and was unable to recall accurate information despite repeated exposure to information via many means.  Pt required consistent max verbal cueing and direction to attend to the task of grooming and max A to recall activity. Unable to provide any basic biographical information. Session 2: Focus on attention and direction-following in the context of PO intake. Pt was unable to initiate mastication despite max verbal cueing and modeling and required removal of each trial of PO from her mouth. Pt was able to drink liquids and swallow with max verbal cueing. Oral holding of PO was > 5 minutes and pt required step by step directions to initiate expectoration. This session, pt was O x self and [place], [place] [employer]. Confabulations or  "I don't know" were the most common response to questions. Poor attention and new learning noted.  Function:  Eating Eating     Eating Assist Level: Helper performs IV, parenteral or tube feed   Eating Set Up Assist For: Opening containers;Parenteral or tube feed supplies;Cutting food       Cognition Comprehension Comprehension assist level: Understands basic 25 - 49% of the time/ requires cueing 50 - 75% of the time  Expression   Expression assist level: Expresses basic 25 - 49% of the time/requires cueing 50 - 75% of the time. Uses single words/gestures.  Social Interaction Social Interaction assist level: Interacts appropriately 25 - 49% of time - Needs frequent redirection.  Problem Solving Problem solving assist level: Solves basic less than 25% of the time - needs direction nearly all the time or does not effectively solve problems and may need a restraint for safety  Memory Memory assist level: Recognizes or recalls less than 25% of the time/requires cueing greater than 75% of the time    Pain Pain Assessment Pain Assessment: Faces Pain Score: Asleep Faces Pain Scale: Hurts little more Pain Location: Back Pain Intervention(s): Repositioned  Therapy/Group: Individual Therapy  Rocky CraftsKara E Peniel Hass MA, CCC-SLP 04/21/2016, 1:30 PM

## 2016-04-21 NOTE — Progress Notes (Signed)
Occupational Therapy Session Note  Patient Details  Name: Shelly Mcdaniel MRN: [id number] Date of Birth: 12/01/1955  Today's Date: 04/21/2016 OT Individual Time: 1452-1539 OT Individual Time Calculation (min): 47 min     Short Term Goals: Week 2:  OT Short Term Goal 1 (Week 2): Pt will initate and complete UB bathing with min assist and no more than mod instructional cueing.  OT Short Term Goal 2 (Week 2): Pt will perform LB bathing with mod assist sit to stand.   OT Short Term Goal 3 (Week 2): Pt will maintain static sitting balance EOB with supervison in preparation for selfcare tasks and functional transfers.   OT Short Term Goal 4 (Week 2): Pt will complete stand pivot transfer to 3:1 with mod assist 2 consecutive sessions.    Skilled Therapeutic Interventions/Progress Updates:    Pt worked on transfer from supine to sit on the left side with max assist.  Once sitting worked on donning shoes and left AFO sitting EOB.  She needed max instructional cueing to identify and state that it was a shoe as well as for donning it on the right foot.  Max assist needed for fastening velcro straps as well on the shoe.  Mod assist for stand pivot transfer from bed to wheelchair.  Worked on grooming tasks of brushing hair and also brushing teeth at the sink.  Mod questioning cueing needed for pt initiate and complete brushing hair, with min assist for thoroughness.  Mod assist needed for brushing teeth as pt would just bring it to her lips but would not actually brush with it.  Completed toilet transfer with max assist to the left with max assist for clothing management.  Supervision for peri hygiene in sitting.  Pt left in wheelchair at end of session with safety belt in place and pt's husband present.  He plans on attending next OT sessions in order to be checked off for toilet transfers.   Therapy Documentation Precautions:  Precautions Precautions: Fall Precaution Comments: painful left arm with  moderate left hemiparesis  Required Braces or Orthoses: Other Brace/Splint Other Brace/Splint: resting hand splint for the left hand at night Restrictions Weight Bearing Restrictions: No  Pain: Pain Assessment Pain Assessment: Faces Faces Pain Scale: Hurts little more Pain Location: Back Pain Intervention(s): Repositioned ADL: See Function Navigator for Current Functional Status.   Therapy/Group: Individual Therapy  Yasmeen Manka OTR/L 04/21/2016, 4:19 PM

## 2016-04-21 NOTE — Progress Notes (Signed)
[facility] PHYSICAL MEDICINE & REHABILITATION     PROGRESS NOTE    Subjective/Complaints: Inconsistent appetite, discussed attn and concentration, poss use of methylphenidate   ROS limited due to cognition  Objective: Vital Signs: Blood pressure (!) 142/98, pulse 99, temperature 98.4 F (36.9 C), temperature source Oral, resp. rate 18, height 5\' 7"  (1.702 m), weight 72.7 kg (160 lb 4.8 oz), SpO2 97 %. No results found. No results for input(s): WBC, HGB, HCT, PLT in the last 72 hours. No results for input(s): NA, K, CL, GLUCOSE, BUN, CREATININE, CALCIUM in the last 72 hours.  Invalid input(s): CO CBG (last 3)   Recent Labs  04/20/16 1626 04/20/16 2208 04/21/16 0714  GLUCAP 160* 176* 286*    Wt Readings from Last 3 Encounters:  04/21/16 72.7 kg (160 lb 4.8 oz)  04/05/16 73.3 kg (161 lb 9.6 oz)  01/15/16 75.2 kg (165 lb 12.8 oz)    Physical Exam:  Constitutional: lying comfortably in bed HENT:  Head: Normocephalic and atraumatic.  Right Ear: External ear normal.  Left Ear: External ear normal.  Mouth/Throat: Oropharynx is clear and moist.  Eyes: Conjunctivae and EOM are normal. Pupils are equal, round, and reactive to light. No scleral icterus.  Neck: No JVD present. No tracheal deviation present. No thyromegaly present.  Cardiovascular: Normal rate, regular rhythm and intact distal pulses.  Exam reveals no friction rub.   No murmur heard. Respiratory: Effort normal. No respiratory distress. She has no wheezes. She has no rales. She exhibits no tenderness.  GI: She exhibits no distension. There is no tenderness. There is no rebound.  Musculoskeletal: She exhibits no edema. Tenderness:.No pain with RUE ROM, Neurological: She is anxious/confused. She displays abnormal reflexes (LUE and LLE 3+). A cranial nerve deficits (left central 7 and tongue deviation.) is present. She exhibits abnormal muscle tone (MAS Left shoulder adductors 2-3/4. biceps 2/4, wrist flexors 2/4. 1/4  knee flexors/plantrar flexors. 4-5 beats of clonus at left ankle--no change). Pain with attempted finger ext and wrist ext and elbow ext Coordination normal.  RUE and RLE 4 to 4+/5. LUE underlying 1/4 deltoid, biceps, triceps, wrist and finger flexors.  LLE 1+ HF, KE and 0/5 ADF/PF.--no change   Skin: No rash noted. No erythema. No pallor.  Psychiatric: She has a normal mood and affect.  Finger flexors are relaxed today, pain with elbow ROM, tone related  Assessment/Plan: 1. 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 Physiatrist is providing close team supervision and 24 hour management of active medical problems listed below. Physiatrist and rehab team continue to assess barriers to discharge/monitor patient progress toward functional and medical goals.  Function:  Bathing Bathing position   Position: Shower  Bathing parts Body parts bathed by patient: Chest, Abdomen, Left arm, Right upper leg, Left upper leg Body parts bathed by helper: Right arm, Front perineal area, Buttocks, Right lower leg, Left lower leg, Back  Bathing assist Assist Level: Touching or steadying assistance(Pt > 75%)      Upper Body Dressing/Undressing Upper body dressing   What is the patient wearing?: Pull over shirt/dress     Pull over shirt/dress - Perfomed by patient: Thread/unthread right sleeve, Pull shirt over trunk Pull over shirt/dress - Perfomed by helper: Thread/unthread left sleeve, Put head through opening        Upper body assist Assist Level:  (Mod A)      Lower  Body Dressing/Undressing Lower body dressing   What is the patient wearing?: Non-skid slipper socks, Pants Underwear - Performed by patient: Thread/unthread right underwear leg Underwear - Performed by helper: Thread/unthread left underwear leg, Pull underwear up/down Pants- Performed  by patient: Thread/unthread right pants leg Pants- Performed by helper: Thread/unthread left pants leg, Pull pants up/down, Fasten/unfasten pants Non-skid slipper socks- Performed by patient: Don/doff right sock (doff) Non-skid slipper socks- Performed by helper: Don/doff right sock, Don/doff left sock   Socks - Performed by helper: Don/doff right sock, Don/doff left sock Shoes - Performed by patient: Don/doff right shoe Shoes - Performed by helper: Don/doff right shoe, Don/doff left shoe, Fasten right   AFO - Performed by helper: Don/doff left AFO   TED Hose - Performed by helper: Don/doff right TED hose, Don/doff left TED hose  Lower body assist Assist for lower body dressing:  (Max A)      Toileting Toileting Toileting activity did not occur: No continent bowel/bladder event   Toileting steps completed by helper: Adjust clothing prior to toileting, Performs perineal hygiene, Adjust clothing after toileting Toileting Assistive Devices: Grab bar or rail  Toileting assist Assist level: Two helpers   Transfers Chair/bed transfer   Chair/bed transfer method: Stand pivot Chair/bed transfer assist level: Touching or steadying assistance (Pt > 75%) Chair/bed transfer assistive device: Armrests     Locomotion Ambulation     Max distance: 35 Assist level: Moderate assist (Pt 50 - 74%)   Wheelchair   Type: Manual Max wheelchair distance: 1360ft Assist Level: Touching or steadying assistance (Pt > 75%)  Cognition Comprehension Comprehension assist level: Understands basic 75 - 89% of the time/ requires cueing 10 - 24% of the time  Expression Expression assist level: Expresses basic 75 - 89% of the time/requires cueing 10 - 24% of the time. Needs helper to occlude trach/needs to repeat words.  Social Interaction Social Interaction assist level: Interacts appropriately 50 - 74% of the time - May be physically or verbally inappropriate.  Problem Solving Problem solving assist level:  Solves basic 25 - 49% of the time - needs direction more than half the time to initiate, plan or complete simple activities  Memory Memory assist level: Recognizes or recalls 25 - 49% of the time/requires cueing 50 - 75% of the time   Medical Problem List and Plan: 1.  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  DVT Prophylaxis/Anticoagulation: Lovenox. Monitor for any bleeding episode  3. Pain Management with recent fall and persistent rib cage pain. Also has had prominent spasms/cramping at night  -Lidoderm patch increase to 3 patches Baclofen10 mg 3 times a day helping with tone, Robaxin as needed.  -resumed lyrica (home medication) at 75mg  qhs (was using 75mg  in am and 150mg  in pm at home)--off Lyrica 4. Mood: Zoloft 50 mg daily, 5. Neuropsych: This patient is not capable of making decisions on her own behalf.   -HS confusion/altered sleep/wake cycle which has carried over from acute to admission here.  -increase melatonin to 6mg  QHS (home medication)  -keep sleep chart  -need to re-establish sleep wake cycle  -all labs reviewed today  -ua/ucx negative 6. Skin/Wound Care: Routine skin checks 7. 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 Hemoglobin A1c 6.7.  Check blood sugars before meals and at bedtime  -sugars low given sparce po intake  -increase levemir 30U qhs, am CBG elevated due to Nocturnal TF  -Start Glucophage 1000 mg BID 11/6 CBG (last 3)   Recent Labs  04/20/16 1626 04/20/16 2208 04/21/16 0714  GLUCAP 160* 176* 286*    9.Hypertension with bouts of  orthostasis. Presently on Norvasc 5 mg daily. Patient also on lisinopril 40 mg daily as well as Accuretic prior to admission.  Resume as needed 10 Hyperlipidemia .Zocor 40 mg daily 12. Spastic left hemiparesis, acute on chronic             -baclofen just resumed , Elbow flexor spasticity better, still has pain with PROM FFs             -consider botox injections, discussed with husband who is comfortable with that             -continue splinting/ROM- improving  But fluctuating in terms of location elbow vs fingers- Elbow fexors more cosistently spastic than wrist flexors although tighter this am 13. Poor po intake/altered mentation  -NGT placed- TF at noc ,Trial Megace   -continue TF at night---encourage PO during the day,  -anxiety/cognitive component most prominently  14.Basilar stenosis Plavix per neuro     LOS (Days) 13 A FACE TO FACE EVALUATION WAS PERFORMED  Erick Colace 04/21/2016 7:39 AM

## 2016-04-22 ENCOUNTER — Inpatient Hospital Stay (HOSPITAL_COMMUNITY): Payer: BC Managed Care – PPO | Admitting: Physical Therapy

## 2016-04-22 ENCOUNTER — Encounter: Payer: BC Managed Care – PPO | Admitting: Occupational Therapy

## 2016-04-22 ENCOUNTER — Inpatient Hospital Stay (HOSPITAL_COMMUNITY): Payer: BC Managed Care – PPO | Admitting: Speech Pathology

## 2016-04-22 ENCOUNTER — Inpatient Hospital Stay (HOSPITAL_COMMUNITY): Payer: BC Managed Care – PPO | Admitting: Occupational Therapy

## 2016-04-22 ENCOUNTER — Ambulatory Visit: Payer: BC Managed Care – PPO | Admitting: Rehabilitation

## 2016-04-22 LAB — GLUCOSE, CAPILLARY
GLUCOSE-CAPILLARY: 168 mg/dL — AB (ref 65–99)
GLUCOSE-CAPILLARY: 148 mg/dL — AB (ref 65–99)
GLUCOSE-CAPILLARY: 101 mg/dL — AB (ref 65–99)
GLUCOSE-CAPILLARY: 179 mg/dL — AB (ref 65–99)

## 2016-04-22 MED ORDER — METHYLPHENIDATE HCL 5 MG PO TABS
5.0000 mg | ORAL_TABLET | Freq: Two times a day (BID) | ORAL | Status: DC
  Administered 2016-04-22 – 2016-05-01 (×19): 5 mg via ORAL
  Filled 2016-04-22 (×19): qty 1

## 2016-04-22 NOTE — Progress Notes (Signed)
Social Work Patient ID: Shelly Mcdaniel, female   DOB: [birth year], 60 y.o.   MRN: [id number]  Met with pt and husband briefly due to [facility] RN here to see her and assess her skin. Husband aware team  Conference and progress this week. Is concerned about her still having a NG tube, he knows she can not go home with this. Aware she will require 24 hr care upon discharge from rehab. Will continue To work on discharge needs.

## 2016-04-22 NOTE — Procedures (Signed)
Botox Injection for spasticity   Dilution: 40 Units/ml Indication: Severe spasticity which interferes with ADL,mobility and/or  hygiene and is unresponsive to medication management and other conservative care Informed consent was obtained after describing risks and benefits of the procedure with the patient. This includes bleeding, bruising, infection, excessive weakness, or medication side effects.  Needle: 25g 1.5" needle  Number of units per muscle Pectoralis40 Biceps40 Brachiorad 80 FCR40  All injections after negative drawback for blood. The patient tolerated the procedure well. Post procedure instructions were given. A followup appointment was made.

## 2016-04-22 NOTE — Consult Note (Addendum)
WOC Nurse wound consult note Reason for Consult: Consult requested for buttocks. Family member at the bedside to assess wound appearance and discuss plan of care.  Wound type: Inner gluteal fold/bilat buttocks with red, macerated skin, affected area is 5X1.5cm.  There are patchy areas of partial thickness skin loss, pink and moist approx 2X1X.1cm. Appearance is consistent with moisture associated skin damage, this is NOT a pressure injury. Drainage (amount, consistency, odor) Mod amt yellow drainage. Dressing procedure/placement/frequency: Foam dressing to absorb drainage and promote healing. Discussed plan of care with patient and family member at the bedside.  Reviewed avoiding wearing briefs when patient is not OOB with therapy, since this can trap moisture against the skin.  Discussed properties of foam dressing for wicking moisture away from skin and protection from shear.  Pt has a pressure reducing chair cushion on the wheelchair.  Encouraged to position off the affected area when in bed.  They verbalized understanding. Please re-consult if further assistance is needed.  Thank-you,  Cammie Mcgeeawn Shammara Jarrett MSN, RN, CWOCN, [place]WCN-AP, CNS [phone]

## 2016-04-22 NOTE — Progress Notes (Signed)
Occupational Therapy Session Note  Patient Details  Name: Shelly Mcdaniel MRN: [id number] Date of Birth: 1956/01/30  Today's Date: 04/22/2016 OT Individual Time: 0800-0900 OT Individual Time Calculation (min): 60 min     Short Term Goals: Week 2:  OT Short Term Goal 1 (Week 2): Pt will initate and complete UB bathing with min assist and no more than mod instructional cueing.  OT Short Term Goal 2 (Week 2): Pt will perform LB bathing with mod assist sit to stand.   OT Short Term Goal 3 (Week 2): Pt will maintain static sitting balance EOB with supervison in preparation for selfcare tasks and functional transfers.   OT Short Term Goal 4 (Week 2): Pt will complete stand pivot transfer to 3:1 with mod assist 2 consecutive sessions.    Skilled Therapeutic Interventions/Progress Updates:    Pt worked on self feeding to start session.  Max instructional cueing to scoop up food and bring to mouth.  She would also need cueing to chew and swallow the food.  Each bite she took required cueing as she could not sequence it herself.  Stopped feeding task secondary to decreased time in order to work on selfcare.  Mod assist for supine to sit and for transfer to the wheelchair for bathing at the sink.  She needed max instructional cueing to sequence through task, with increased perseveration on continually placing the washcloth in the water and then ringing it out but not attempting to wash unless given max instructional cueing. Pt still very confused asking " whose soap is that" and "do they come get the soap when were done?"  She needed mod assist for donning pullover shirt with max assist needed for donning button up shirt, left AFO, and left shoe.  Pt left in wheelchair at end of session with safety belt in place.  MD present for procedure.    Therapy Documentation Precautions:  Precautions Precautions: Fall Precaution Comments: painful left arm with moderate left hemiparesis  Required Braces or  Orthoses: Other Brace/Splint Other Brace/Splint: resting hand splint for the left hand at night Restrictions Weight Bearing Restrictions: No  Pain: Pain Assessment Pain Assessment: Faces Faces Pain Scale: Hurts little more Pain Type: Chronic pain Pain Location: Arm Pain Orientation: Left Pain Descriptors / Indicators: Discomfort Pain Onset: With Activity Pain Intervention(s): Repositioned;Medication (See eMAR) ADL: See Function Navigator for Current Functional Status.   Therapy/Group: Individual Therapy  Anahla Bevis OTR/L 04/22/2016, 9:32 AM

## 2016-04-22 NOTE — Progress Notes (Signed)
Physical Therapy Session Note  Patient Details  Name: Shelly Mcdaniel MRN: [id number] Date of Birth: [birth year]  Today's Date: 04/22/2016 PT Individual Time: 1100-1204 PT Individual Time Calculation (min): 64 min    Short Term Goals: Week 2:  PT Short Term Goal 1 (Week 2): Patient will perform sit<>stand transfers with min Assist  PT Short Term Goal 2 (Week 2): patient will perform squat pivot with min assit from PT.  PT Short Term Goal 3 (Week 2): patient will perform gait x 90f with LRAD and mod Assist.  PT Short Term Goal 4 (Week 2): Patient will be able sustain attent to task for >5 minutes with only min cues.  PT Short Term Goal 5 (Week 2): Patient will performed WC mobility with supervision Assist x 1552f  Skilled Therapeutic Interventions/Progress Updates:     Patient received supine in bed and agreeable PT. Supine>sit with min assist to manage the LLE and heavy use of bed rails.   PT instructed patient and husband in toilet [facility] with assist from PT with min assist and mod cues x 4 with assist from husband with mod assist. PT provided mod cues for patient for proper sequencing in one step commands. Pt's husband required moderate cues for proper hand placement to prevent pain in L shoulder as well as instruction to break down commands into one step commands  WC mobility with task to locate elevators in central wing, patient able to return goal for up to 35seconds and required max cues to remain [facility].    Stair trainig to ascend and descend . 8steps, 3 inches, min Assist for ascent and mod assist for descent with cues for proper step to pattern and facilitation of LLE limb advancement wit descent.   Throughout treatment patient performed sit<>stand x 8 with min assist and only min cues for initiation.  Bed transfer with mod assist from PT to return to supine at end of session. Patient left supine in bed with call bell in reach and all needs met.    Therapy Documentation Precautions:  Precautions Precautions: Fall Precaution Comments: painful left arm with moderate left hemiparesis  Required Braces or Orthoses: Other Brace/Splint Other Brace/Splint: resting hand splint for the left hand at night Restrictions Weight Bearing Restrictions: No General:   Vital Signs: Therapy Vitals Temp: 97.6 F (36.4 C) Temp Source: Oral Pulse Rate: 100 Resp: 18 BP: 135/77 Patient Position (if appropriate): Lying Oxygen Therapy SpO2: 99 % O2 Device: Not Delivered  See Function Navigator for Current Functional Status.   Therapy/Group: Individual Therapy  AuLorie Phenix1/03/2016, 4:50 PM

## 2016-04-22 NOTE — Progress Notes (Signed)
Speech Language Pathology Weekly Progress and Session Note  Patient Details  Name: Shelly Mcdaniel MRN: [id number] Date of Birth: [birth year]  Beginning of progress report period:  April 15, 2016  End of progress report period:  April 22, 2016   Today's Date: 04/22/2016 SLP Individual Time: 1440-1535 SLP Individual Time Calculation (min): 55 min   Short Term Goals: Week 2: SLP Short Term Goal 1 (Week 2): Pt will sustain her attention to basic, familiar tasks for 1 minute intervals with mod assist multimodal cues for redirection.   SLP Short Term Goal 1 - Progress (Week 2): Met SLP Short Term Goal 2 (Week 2): Pt will intiate and complete basic, familiar tasks with no more than 5 verbal cues for sequencing and organization.  SLP Short Term Goal 2 - Progress (Week 2): Progressing toward goal SLP Short Term Goal 3 (Week 2): Pt will utilize external aids to orient to place, date, and situation with min assist verbal and visual cues.   SLP Short Term Goal 3 - Progress (Week 2): Met SLP Short Term Goal 4 (Week 2): Pt will utilize call bell to convey needs and wants to caregivers with max assist multimodal cues.  SLP Short Term Goal 4 - Progress (Week 2): Met SLP Short Term Goal 5 (Week 2): Pt will consume regular textures and thin liquids with mod assist for swallow initiation and clearance of oral cavity. SLP Short Term Goal 5 - Progress (Week 2): Met SLP Short Term Goal 6 (Week 2):      New Short Term Goals: Week 3: SLP Short Term Goal 1 (Week 3): Pt will sustain her attention to basic, familiar tasks for 2-3 minute intervals with mod assist multimodal cues for redirection.   SLP Short Term Goal 2 (Week 3): Pt will intiate and complete basic, familiar tasks with no more than 5 verbal cues for sequencing and organization.  SLP Short Term Goal 3 (Week 3): Pt will utilize external aids to orient to place, date, and situation with min assist verbal cues.   SLP Short Term Goal 4 (Week 3): Pt  will utilize call bell to convey needs and wants to caregivers with max assist verbal cues.  SLP Short Term Goal 5 (Week 3): Pt will consume regular textures and thin liquids with min assist for swallow initiation and clearance of oral cavity.  Weekly Progress Updates: Pt has made slow and inconsistent functional gains this reporting period and has met 4 out of 5 short term goals.  Pt is now consuming regular textures and thin liquids with overall min-mod assist verbal cues for attention to boluses to clear oral cavity and initiate swallow in a timely manner which is improved in comparison to previous reporting period.  Pt has also demonstrated improved sustained attention to tasks as well as task initiation; however, these gains are not consistent and can vary significantly within therapy sessions.  Pt continues to need overall max assist for very basic tasks due to severe cognitive impairment.   As a result, pt would benefit from continued ST follow up at next level of care to address cognitive function and swallowing safety.  Anticipate that pt will need 24/7 supervision at discharge in addition to [place] follow up at next level of care.  Pt and family education is ongoing but has been limited this reporting period due to inconsistent family participation in therapies.     Intensity: Minumum of 1-2 x/day, 30 to 90 minutes Frequency: 3 to 5 out of  7 days Duration/Length of Stay: 14-21 days  Treatment/Interventions: Cognitive remediation/compensation;Cueing hierarchy;Functional tasks;Environmental controls;Internal/external aids;Patient/family education   Daily Session  Skilled Therapeutic Interventions: Pt was seen for skilled ST targeting cognitive goals.  Upon arrival pt was oriented to place and situation with min question cues.   SLP facilitated the session with a basic symbol cancellation task targeting sustained attention to task and task initiation.  Pt was able to locate 10 out of 10 targets with  supervision verbal cues for task initiation and was able to sustain her attention to task for 1-2 minutes with min verbal cues for redirection.  When therapist attempted to increase task challenge by incorporating one structural constraint (pt had to locate letters in alphabetical order) pt needed up to max assist verbal and visual cues due to increased demand on working memory which further challenged her attention to task.  Pt also needed max assist verbal and visual cues to recreate 3, 4 and 5 component patterns with colored blocks from a visual model due to poor sustained attention to task; >1 minute.  Suspect fatigue to be impacting function by the end of today's therapy session.  Pt was left in bed with call bell within reach and bed alarm set.  Pt needed max assist multimodal cues to return demonstration of use of call bell.  Goals updated on this date to reflect current progress and plan of care.      Function:   Eating Eating   Modified Consistency Diet: Yes Eating Assist Level: Supervision or verbal cues   Eating Set Up Assist For: Opening containers;Cutting food Helper [place] on Utensil: Occasionally     Cognition Comprehension Comprehension assist level: Understands basic 50 - 74% of the time/ requires cueing 25 - 49% of the time  Expression   Expression assist level: Expresses basic 75 - 89% of the time/requires cueing 10 - 24% of the time. Needs helper to occlude trach/needs to repeat words.  Social Interaction Social Interaction assist level: Interacts appropriately 25 - 49% of time - Needs frequent redirection.  Problem Solving Problem solving assist level: Solves basic 25 - 49% of the time - needs direction more than half the time to initiate, plan or complete simple activities  Memory Memory assist level: Recognizes or recalls less than 25% of the time/requires cueing greater than 75% of the time   General    Pain Pain Assessment Pain Assessment: No/denies  pain  Therapy/Group: Individual Therapy  Mckaela Howley, Selinda Orion 04/22/2016, 5:02 PM

## 2016-04-23 ENCOUNTER — Inpatient Hospital Stay (HOSPITAL_COMMUNITY): Payer: BC Managed Care – PPO | Admitting: Physical Therapy

## 2016-04-23 ENCOUNTER — Inpatient Hospital Stay (HOSPITAL_COMMUNITY): Payer: BC Managed Care – PPO | Admitting: Occupational Therapy

## 2016-04-23 ENCOUNTER — Inpatient Hospital Stay (HOSPITAL_COMMUNITY): Payer: BC Managed Care – PPO

## 2016-04-23 LAB — GLUCOSE, CAPILLARY
GLUCOSE-CAPILLARY: 122 mg/dL — AB (ref 65–99)
GLUCOSE-CAPILLARY: 96 mg/dL (ref 65–99)
Glucose-Capillary: 258 mg/dL — ABNORMAL HIGH (ref 65–99)
Glucose-Capillary: 194 mg/dL — ABNORMAL HIGH (ref 65–99)

## 2016-04-23 LAB — URINE MICROSCOPIC-ADD ON: SQUAMOUS EPITHELIAL / LPF: NONE SEEN

## 2016-04-23 LAB — URINALYSIS, ROUTINE W REFLEX MICROSCOPIC
BILIRUBIN URINE: NEGATIVE
GLUCOSE, UA: NEGATIVE mg/dL
Ketones, ur: NEGATIVE mg/dL
Nitrite: NEGATIVE
Protein, ur: NEGATIVE mg/dL
Specific Gravity, Urine: 1.007 (ref 1.005–1.030)
pH: 7 (ref 5.0–8.0)

## 2016-04-23 NOTE — Progress Notes (Signed)
[facility] PHYSICAL MEDICINE & REHABILITATION     PROGRESS NOTE    Subjective/Complaints: Husband notes good appetite for dinner yesterday   ROS limited due to cognition  Objective: Vital Signs: Blood pressure 139/84, pulse 92, temperature 98.3 F (36.8 C), temperature source Oral, resp. rate 20, height 5\' 7"  (1.702 m), weight 71.4 kg (157 lb 4.8 oz), SpO2 99 %. No results found. No results for input(s): WBC, HGB, HCT, PLT in the last 72 hours. No results for input(s): NA, K, CL, GLUCOSE, BUN, CREATININE, CALCIUM in the last 72 hours.  Invalid input(s): CO CBG (last 3)   Recent Labs  04/22/16 1614 04/22/16 2128 04/23/16 0712  GLUCAP 101* 179* 122*    Wt Readings from Last 3 Encounters:  04/23/16 71.4 kg (157 lb 4.8 oz)  04/05/16 73.3 kg (161 lb 9.6 oz)  01/15/16 75.2 kg (165 lb 12.8 oz)    Physical Exam:  Constitutional: lying comfortably in bed HENT:  Head: Normocephalic and atraumatic.  Right Ear: External ear normal.  Left Ear: External ear normal.  Mouth/Throat: Oropharynx is clear and moist.  Eyes: Conjunctivae and EOM are normal. Pupils are equal, round, and reactive to light. No scleral icterus.  Neck: No JVD present. No tracheal deviation present. No thyromegaly present.  Cardiovascular: Normal rate, regular rhythm and intact distal pulses.  Exam reveals no friction rub.   No murmur heard. Respiratory: Effort normal. No respiratory distress. She has no wheezes. She has no rales. She exhibits no tenderness.  GI: She exhibits no distension. There is no tenderness. There is no rebound.  Musculoskeletal: She exhibits no edema. Tenderness:.No pain with RUE ROM, Neurological: She is anxious/confused. She displays abnormal reflexes (LUE and LLE 3+). A cranial nerve deficits (left central 7 and tongue deviation.) is present. She exhibits abnormal muscle tone (MAS Left shoulder adductors 2-3/4. biceps 2/4, wrist flexors 2/4. 1/4 knee flexors/plantrar flexors. 4-5  beats of clonus at left ankle--no change). Pain with attempted finger ext and wrist ext and elbow ext Coordination normal.  RUE and RLE 4 to 4+/5. LUE underlying 1/4 deltoid, biceps, triceps, wrist and finger flexors.  LLE 1+ HF, KE and 0/5 ADF/PF.--no change   Skin: No rash noted. No erythema. No pallor.  Psychiatric: She has a normal mood and affect.  Finger flexors are relaxed today, pain with elbow ROM, tone related  Assessment/Plan: 1. 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 Physiatrist is providing close team supervision and 24 hour management of active medical problems listed below. Physiatrist and rehab team continue to assess barriers to discharge/monitor patient progress toward functional and medical goals.  Function:  Bathing Bathing position   Position: Wheelchair/chair at sink  Bathing parts Body parts bathed by patient: Left arm, Chest, Abdomen, Right upper leg, Left upper leg, Right lower leg Body parts bathed by helper: Left lower leg, Front perineal area, Right arm  Bathing assist Assist Level: Touching or steadying assistance(Pt > 75%)      Upper Body Dressing/Undressing Upper body dressing   What is the patient wearing?: Pull over shirt/dress     Pull over shirt/dress - Perfomed by patient: Thread/unthread right sleeve, Put head through opening Pull over shirt/dress - Perfomed by helper: Thread/unthread left sleeve, Pull shirt over trunk        Upper body assist Assist Level:  (Mod A)      Lower Body Dressing/Undressing Lower body  dressing   What is the patient wearing?: Pants, Non-skid slipper socks, AFO, Ted Hose, Shoes Underwear - Performed by patient: Thread/unthread right underwear leg Underwear - Performed by helper: Thread/unthread left underwear leg, Pull underwear up/down Pants- Performed by  patient: Thread/unthread right pants leg Pants- Performed by helper: Thread/unthread left pants leg, Pull pants up/down Non-skid slipper socks- Performed by patient: Don/doff right sock Non-skid slipper socks- Performed by helper: Don/doff left sock   Socks - Performed by helper: Don/doff right sock, Don/doff left sock Shoes - Performed by patient: Don/doff right shoe Shoes - Performed by helper: Don/doff left shoe, Fasten right, Fasten left   AFO - Performed by helper: Don/doff left AFO   TED Hose - Performed by helper: Don/doff right TED hose, Don/doff left TED hose  Lower body assist Assist for lower body dressing:  (Max A)      Toileting Toileting Toileting activity did not occur: No continent bowel/bladder event   Toileting steps completed by helper: Adjust clothing prior to toileting, Performs perineal hygiene, Adjust clothing after toileting Toileting Assistive Devices: Grab bar or rail  Toileting assist Assist level: Two helpers   Transfers Chair/bed transfer   Chair/bed transfer method: Stand pivot Chair/bed transfer assist level: Moderate assist (Pt 50 - 74%/lift or lower) Chair/bed transfer assistive device: Armrests     Locomotion Ambulation     Max distance: 2310ft  Assist level: Moderate assist (Pt 50 - 74%)   Wheelchair   Type: Manual Max wheelchair distance: 1900ft  Assist Level: Touching or steadying assistance (Pt > 75%)  Cognition Comprehension Comprehension assist level: Understands basic 50 - 74% of the time/ requires cueing 25 - 49% of the time  Expression Expression assist level: Expresses basic 75 - 89% of the time/requires cueing 10 - 24% of the time. Needs helper to occlude trach/needs to repeat words.  Social Interaction Social Interaction assist level: Interacts appropriately 25 - 49% of time - Needs frequent redirection.  Problem Solving Problem solving assist level: Solves basic 25 - 49% of the time - needs direction more than half the time to  initiate, plan or complete simple activities  Memory Memory assist level: Recognizes or recalls less than 25% of the time/requires cueing greater than 75% of the time   Medical Problem List and Plan: 1.  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  DVT Prophylaxis/Anticoagulation: Lovenox. Monitor for any bleeding episode  3. Pain Management with recent fall and persistent rib cage pain. Also has had prominent spasms/cramping at night  -Lidoderm patch increase to 3 patches Baclofen15 mg 3 times a day helping with tone, Robaxin as needed.  -off Lyrica 4. Mood: Zoloft 50 mg daily, 5. Neuropsych: This patient is not capable of making decisions on her own behalf.   -HS confusion/altered sleep/wake cycle which has carried over from acute to admission here.  -increase melatonin to 6mg  QHS (home medication)  -keep sleep chart  -need to re-establish sleep wake cycle  -all labs reviewed today  -ua/ucx negative 6. Skin/Wound Care: Routine skin checks 7. 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 Hemoglobin A1c 6.7.  Check blood sugars before meals and at bedtime  -sugars low given sparce po intake  -increase levemir 30U qhs, am CBG elevated due to Nocturnal TF  -Start Glucophage 1000 mg BID 11/6 CBG (last 3)   Recent Labs  04/22/16 1614 04/22/16 2128 04/23/16 0712  GLUCAP 101* 179* 122*    9.Hypertension with bouts of  orthostasis. Presently on Norvasc 5 mg daily. Patient also on lisinopril 40 mg daily as well as Accuretic prior to admission. Resume as needed 10 Hyperlipidemia .Zocor 40 mg daily 12. Spastic left hemiparesis, acute on chronic             -baclofen just resumed , Elbow flexor  spasticity better, still has pain with PROM FFs             -consider botox injections, discussed with husband who is comfortable with that             -continue splinting/ROM-s/p Botox to Elbow and wrist flexors 11/10 13. Poor po intake/altered mentation  -NGT placed- TF at noc ,Trial Megace, cal ct per dietary   -continue TF at night---encourage PO during the day,  -anxiety/cognitive component most prominently  14.Basilar stenosis Plavix per neuro     LOS (Days) 15 A FACE TO FACE EVALUATION WAS PERFORMED  Erick ColaceKIRSTEINS,ANDREW E 04/23/2016 7:46 AM

## 2016-04-23 NOTE — Progress Notes (Signed)
Occupational Therapy Session Note  Patient Details  Name: Shelly Mcdaniel MRN: [id number] Date of Birth: 12/02/1955  Today's Date: 04/23/2016 OT Individual Time: 3568-6168 OT Individual Time Calculation (min): 60 min   Short Term Goals: Week 1:  OT Short Term Goal 1 (Week 1): Pt will initate and complete UB bathing with min assist and no more than mod instructional cueing.  OT Short Term Goal 1 - Progress (Week 1): Not met OT Short Term Goal 2 (Week 1): Pt will perform LB bathing with mod assist sit to stand.   OT Short Term Goal 2 - Progress (Week 1): Not met OT Short Term Goal 3 (Week 1): Pt will maintain static sitting balance EOB with supervison in preparation for selfcare tasks and functional transfers.   OT Short Term Goal 3 - Progress (Week 1): Not met OT Short Term Goal 4 (Week 1): Pt will complete stand pivot transfer to 3:1 with mod assist 2 consecutive sessions.   OT Short Term Goal 4 - Progress (Week 1): Not met OT Short Term Goal 5 (Week 1): Pt will tolerate 70 degrees AAROM at the left shoulder with pain less than 4/10.  OT Short Term Goal 5 - Progress (Week 1): Met Week 2:  OT Short Term Goal 1 (Week 2): Pt will initate and complete UB bathing with min assist and no more than mod instructional cueing.  OT Short Term Goal 2 (Week 2): Pt will perform LB bathing with mod assist sit to stand.   OT Short Term Goal 3 (Week 2): Pt will maintain static sitting balance EOB with supervison in preparation for selfcare tasks and functional transfers.   OT Short Term Goal 4 (Week 2): Pt will complete stand pivot transfer to 3:1 with mod assist 2 consecutive sessions.       Skilled Therapeutic Interventions/Progress Updates:   Skilled OT session completed with focus on caregiver training, attention and left/right discrimination. Pts daughter Shelly Mcdaniel was present when shoes and left foot orthosis were donned at EOB. Shelly Mcdaniel reported having caregiving responsibilities on weekends after discharge.  Shelly Mcdaniel was educated on proper technique of squat pivot transfers with visual demonstration and hands on practice. Pt completed with Mod A from daughter and min guard from therapist. Afterwards pt was taken to gym and engaged in coloring activity to improve attention and left/right discrimination with pt able to identify left/right accurately in picture 70% of the time. Mod cues to attend to task overall due to environmental distractions. Afterwards pt was taken in w/c by daughter back to room.   Therapy Documentation Precautions:  Precautions Precautions: Fall Precaution Comments: painful left arm with moderate left hemiparesis  Required Braces or Orthoses: Other Brace/Splint Other Brace/Splint: resting hand splint for the left hand at night Restrictions Weight Bearing Restrictions: No General:   Vital Signs: Therapy Vitals Temp: 98.2 F (36.8 C) Temp Source: Oral Pulse Rate: 98 Resp: 18 BP: 139/71 Patient Position (if appropriate): Lying Oxygen Therapy SpO2: 99 % O2 Device: Not Delivered Pain: lap tray and supportive pillow utilized to minimize L UE pain   ADL:  :    See Function Navigator for Current Functional Status.   Therapy/Group: Individual Therapy  Shelly Mcdaniel A Shelly Mcdaniel 04/23/2016, 4:28 PM

## 2016-04-23 NOTE — Progress Notes (Addendum)
Nutrition Follow-up  DOCUMENTATION CODES:   Not applicable  INTERVENTION:   -Continue Glucerna Shake po TID, each supplement provides 220 kcal and 10 grams of protein -Continue nocturnal tube feeds of Jevity 1.5 at goal rate of 270ml/hr x 12 hours (5pm-5am)via Cortrak NGT with 30ml Prostat once daily toprovide 1360kcal (76% of needs), 69grams of protein (95% of protein needs), and 638ml of H2O.  -Continue free water flushes of 100 ml QID per tube. -Provide Magic cup between meals, each supplement provides 290 kcal and 9 grams of protein. -Encourage adequate PO intake.   NUTRITION DIAGNOSIS:   Inadequate oral intake related to lethargy/confusion, nausea as evidenced by  (No meal intake x 4 days).  Ongoing  GOAL:   Patient will meet greater than or equal to 90% of their needs  Progressing  MONITOR:   PO intake, Supplement acceptance, Labs, Weight trends, TF tolerance, Skin, I & O's  REASON FOR ASSESSMENT:   Consult Assessment of nutrition requirement/status  ASSESSMENT:   60 y/o female PMHx DM, HLD, HTN. She experienced a Stroke in August. She presented yesterday with AMS and dysarthria. MRI showed middle cerebellar peduncle likely representing early subcute ischemia. Admitted for comprehensive rehab.   RD received calorie count to assess needs, to initiate calorie count, and for diet education.   Pt receiving physical therapy at time of visit and unable to speak with pt and/or family members during multiple attempts.   Case discussed extensively with RN. She shares that pt continues to receive nocturnal feedings and is tolerating it well. RN reports that NGT is a barrier for discharge and ultimate goal is to transition to PO diet only. Confirmed that MD is requesting another calorie count. Appetite continues to be variable. Per meal completion records, meal intake averaging around 25%. RN reports that pt did not eat a lot of breakfast this AM and provided pt a Glucerna  shake to supplement intake. MD notes that pt intake was good at dinner, consuming 100% of dinner meal (546 kcals and 33 grams protein).   Megace trial was initiated on 04/21/16 to promote oral intake.   RD provided education to maximize PO intake on 04/19/16. Given poor PO intake, pt would likely not benefit from strict DM diet. Recommend diet liberalization to support adequate PO intake (per notes, pt husband has also been proving pt with favorite foods from home).   Labs reviewed: CBGS: 101-258. Pt receiving 850 mg Metformin BID, 30 units Levemir q HS and Novolog (SSI; 0-15 units before meals and at bedtime).    Diet Order:  Diet Carb Modified Fluid consistency: Thin; Room service appropriate? Yes  Skin:  Reviewed, no issues  Last BM:  04/20/16  Height:   Ht Readings from Last 1 Encounters:  04/08/16 5\' 7"  (1.702 m)    Weight:   Wt Readings from Last 1 Encounters:  04/23/16 157 lb 4.8 oz (71.4 kg)    Ideal Body Weight:  61.36 kg  BMI:  Body mass index is 24.64 kg/m.  Estimated Nutritional Needs:   Kcal:  1800-2050  Protein:  75-90 grams  Fluid:  1.8-2.0 L  EDUCATION NEEDS:   No education needs identified at this time  Antoinett Dorman A. Mayford KnifeWilliams, RD, LDN, CDE Pager: [phone] After hours Pager: [phone]

## 2016-04-23 NOTE — Progress Notes (Signed)
Noted consult for Inpatient Diabetes Coordinator for diet education. RD to provide diet education. Therefore will place consult for RD. Chart reviewed and inpatient glycemic control trending good. Agree with current orders for inpatient DM control.  Thanks, Orlando PennerMarie Ameri Cahoon, RN, MSN, CDE Diabetes Coordinator Inpatient Diabetes Program [phone] (Team Pager from 8am to 5pm)

## 2016-04-23 NOTE — Plan of Care (Signed)
Problem: RH BLADDER ELIMINATION Goal: RH STG MANAGE BLADDER WITH ASSISTANCE STG Manage Bladder With mod Assistance  Outcome: Not Progressing incontinence     

## 2016-04-23 NOTE — Progress Notes (Signed)
Physical Therapy Weekly Progress Note  Patient Details  Name: Shelly Mcdaniel MRN: [id number] Date of Birth: 08/15/1955  Beginning of progress report period: 11/ End of progress report period:04/23/16  Today's Date: 04/23/2016 PT Individual Time: 0830-0930 PT Individual Time Calculation (min): 60 min   Patient has met 3 of 5 short term goals.  She has inconsistently met 2/5 STGs; she is able to transfers to R with min assist for squat pivot. Dr. Kirsteins injected her LUE with Botox 04/22/16 and shoulder and hand already have less pain.  Patient continues to demonstrate the following deficits: attention, awareness, motor, balance, activity tolerance and therefore will continue to benefit from skilled PT intervention to enhance overall performance with activity tolerance, balance, postural control, ability to compensate for deficits, functional use of  left upper extremity and left lower extremity, attention and awareness.  Patient progressing toward long term goals..  Continue plan of care.  PT Short Term Goals Week 2:  PT Short Term Goal 1 (Week 2): Patient will perform sit<>stand transfers with min Assist  PT Short Term Goal 1 - Progress (Week 2): Met PT Short Term Goal 2 (Week 2): patient will perform squat pivot with min assit from PT.  PT Short Term Goal 2 - Progress (Week 2): Partly met (to R) PT Short Term Goal 3 (Week 2): patient will perform gait x 40ft with LRAD and mod Assist.  PT Short Term Goal 3 - Progress (Week 2): Partly met PT Short Term Goal 4 (Week 2): Patient will be able sustain attent to task for >5 minutes with only min cues.  PT Short Term Goal 4 - Progress (Week 2): Met PT Short Term Goal 5 (Week 2): Patient will performed WC mobility with supervision Assist x 150ft  PT Short Term Goal 5 - Progress (Week 2): Met  Skilled Therapeutic Interventions/Progress Updates: bed mobility with mod cues and mod assist in flat bed, R rail.  Pt sat bedside with cues for RUE  support while PT donned TEDs, L sock , LAFO and bil shoes.  Sit>< stand with min assist for pulling up shorts.  W/c propulsion to gym using hemi method, supervision with occasional cues.  neuromuscular re-education via forced use, multimodal cues for bil reciprocal motion x bil LEs on Kinetron in sitting, x 25 cycles x 2.  Gait using R rail in hallway, x 20' with min assist; gait with RW, L hand splint x 25' with mod assist for RW progression, upright trunk.  Pt tolerated L hand splint, although passive L wrist extension is limited; she may benefit from adjusting splint.  Pt left resting in w/c with quick release belt applied and all needs within reach.     Therapy Documentation Precautions:  Precautions Precautions: Fall Precaution Comments: painful left arm with moderate left hemiparesis  Required Braces or Orthoses: Other Brace/Splint Other Brace/Splint: resting hand splint for the left hand at night Restrictions Weight Bearing Restrictions: No Pain: none reported   Other Treatments: Treatments Neuromuscular Facilitation: Left;Lower Extremity;Upper Extremity;Forced use;Activity to increase timing and sequencing;Activity to increase motor control;Activity to increase sustained activation;Activity to increase anterior-posterior weight shifting;Activity to increase lateral weight shifting;Limitations  See Function Navigator for Current Functional Status.  Therapy/Group: Individual Therapy  COOK,CAROLINE 04/23/2016, 5:23 PM   

## 2016-04-23 NOTE — Plan of Care (Signed)
Problem: RH Ambulation Goal: LTG Patient will ambulate in controlled environment (PT) LTG: Patient will ambulate in a controlled environment, # of feet with assistance (PT).  down graded due to slow progress Goal: LTG Patient will ambulate in home environment (PT) LTG: Patient will ambulate in home environment, # of feet with assistance (PT).  down graded due to slow progress

## 2016-04-23 NOTE — Progress Notes (Signed)
Physical Therapy Session Note  Patient Details  Name: MARIELY MAHR MRN: [id number] Date of Birth: August 21, 1955  Today's Date: 04/23/2016 PT Individual Time: 1000-1048 PT Individual Time Calculation (min): 48 min    Short Term Goals: Week 2:  PT Short Term Goal 1 (Week 2): Patient will perform sit<>stand transfers with min Assist  PT Short Term Goal 2 (Week 2): patient will perform squat pivot with min assit from PT.  PT Short Term Goal 3 (Week 2): patient will perform gait x 92f with LRAD and mod Assist.  PT Short Term Goal 4 (Week 2): Patient will be able sustain attent to task for >5 minutes with only min cues.  PT Short Term Goal 5 (Week 2): Patient will performed WC mobility with supervision Assist x 155f  Skilled Therapeutic Interventions/Progress Updates:     Patient received sitting in WC and agreeable to PT.  Pt reports that she had urinated and needed clean brief. [facility] also had an incontinent bowel [facility], but she was unaware.   PT instructed patient in sit<>stand with min assist x 10 with HW for personal hygiene and to don new brief and shorts. Patient performed standing balance up to 3 minutes for PT to perform perineal hygiene, with mod cues for increased erect posture and improved WB through forefoot to prevent posterior LOB. PT replaced soiled sacral pad.   Following perineal hygiene and lower body dressing. Sqaut pivot transfer to the R with min assist to bed and heavy use of bed rails.   Sit>supine  On bed with min assist to manage the LLE as well heavy use of bed rails to control UE with the RUE.   Patient left supine in bed with all needs met.   Therapy Documentation Precautions:  Precautions Precautions: Fall Precaution Comments: painful left arm with moderate left hemiparesis  Required Braces or Orthoses: Other Brace/Splint Other Brace/Splint: resting hand splint for the left hand at  night Restrictions Weight Bearing Restrictions: No General: PT Amount of Missed Time (min): 12 Minutes PT Missed Treatment Reason: Patient fatigue;Pain   See Function Navigator for Current Functional Status.   Therapy/Group: Individual Therapy  AuLorie Phenix1/04/2016, 10:55 AM

## 2016-04-24 ENCOUNTER — Inpatient Hospital Stay (HOSPITAL_COMMUNITY): Payer: BC Managed Care – PPO | Admitting: Occupational Therapy

## 2016-04-24 ENCOUNTER — Inpatient Hospital Stay (HOSPITAL_COMMUNITY): Payer: BC Managed Care – PPO | Admitting: Physical Therapy

## 2016-04-24 LAB — GLUCOSE, CAPILLARY
GLUCOSE-CAPILLARY: 181 mg/dL — AB (ref 65–99)
GLUCOSE-CAPILLARY: 255 mg/dL — AB (ref 65–99)
Glucose-Capillary: 219 mg/dL — ABNORMAL HIGH (ref 65–99)
Glucose-Capillary: 116 mg/dL — ABNORMAL HIGH (ref 65–99)

## 2016-04-24 MED ORDER — GERHARDT'S BUTT CREAM
1.0000 "application " | TOPICAL_CREAM | Freq: Two times a day (BID) | CUTANEOUS | Status: DC
  Administered 2016-04-24 – 2016-05-01 (×13): 1 via TOPICAL
  Filled 2016-04-24 (×2): qty 1

## 2016-04-24 MED ORDER — TRAMADOL HCL 50 MG PO TABS
50.0000 mg | ORAL_TABLET | Freq: Four times a day (QID) | ORAL | Status: DC | PRN
  Administered 2016-04-24 – 2016-04-29 (×4): 50 mg via ORAL
  Filled 2016-04-24 (×4): qty 1

## 2016-04-24 MED ORDER — INSULIN DETEMIR 100 UNIT/ML ~~LOC~~ SOLN
33.0000 [IU] | Freq: Every day | SUBCUTANEOUS | Status: DC
  Administered 2016-04-24: 33 [IU] via SUBCUTANEOUS
  Filled 2016-04-24: qty 0.33

## 2016-04-24 NOTE — Plan of Care (Signed)
Problem: RH BLADDER ELIMINATION Goal: RH STG MANAGE BLADDER WITH ASSISTANCE STG Manage Bladder With mod Assistance   Outcome: Progressing incontinence

## 2016-04-24 NOTE — Progress Notes (Signed)
Physical Therapy Session Note  Patient Details  Name: Shelly SimmerJody D Lamprecht MRN: [id number] Date of Birth: 11/05/1955  Today's Date: 04/24/2016 PT Individual Time: 1525-1618 PT Individual Time Calculation (min): 53 min   Skilled Therapeutic Interventions/Progress Updates:    Patient in R sidelying in bed upon arrival, sat EOB with min A with c/o dizziness which resolved with rest. [facility] and patient able to urinate and perform hygiene, total A for clothing management. Gait training in room to sink [facility] with min A overall and cues for hip/knee flexion due to LLE circumduction to advance LLE and therapist providing manual stabilization at L hip abductor. Standing at sink, patient required max-total cues for sequencing hand hygiene and drying hands due to apraxia with min-mod A for standing balance, c/o dizziness and lightheadedness, orthostatic BP assessed in sitting 112/91 and standing 95/59 but patient reporting improvement in dizziness with standing rest. Propelled wheelchair with cues to utilize RUE for power and RLE for steering only instead of just RLE for improved positioning due to increased posterior pelvic tilt and buttocks shearing in wheelchair using RLE only x 150 ft with supervision. Performed simulated car transfer using quad cane with min A overall, max cues for sequencing transfer to/from car but patient with good problem solving for lifting LLE in/out of car. Stair training up/down 4 (6") stairs using R rail x 2 with mod A overall, max verbal cues for sequencing ascending with RLE and descending with LLE and turning at top and bottom of stairs, cues for hip flexion due to LLE circumduction and lateral trunk lean to R to advance LLE and assist for safe placement LLE when descending. Patient's leg rests raised to facilitate 90 deg angle for hip/knee flexion when seated and patient left sitting in wheelchair with husband present, education provided  on pressure relief every 30 min and discussing timed toileting with nursing staff when patient's husband unable to be present to assist patient, both verbalized understanding.   Therapy Documentation Precautions:  Precautions Precautions: Fall Precaution Comments: painful left arm with moderate left hemiparesis  Required Braces or Orthoses: Other Brace/Splint Other Brace/Splint: resting hand splint for the left hand at night Restrictions Weight Bearing Restrictions: No Vital Signs: Seated BP 112/91 Standing BP 95/59 Pain: Pain Assessment Pain Assessment: 0-10 Pain Score: 2  Pain Type: Acute pain Pain Location: Hip Pain Orientation: Left Pain Descriptors / Indicators: Aching Pain Onset: On-going Pain Intervention(s): Repositioned;Rest   See Function Navigator for Current Functional Status.   Therapy/Group: Individual Therapy  Kerney ElbeVarner, Noor Vidales A 04/24/2016, 4:28 PM

## 2016-04-24 NOTE — Progress Notes (Signed)
[facility] PHYSICAL MEDICINE & REHABILITATION     PROGRESS NOTE    Subjective/Complaints: No issues overnite Pt is c/o buttocks pain, per WOC has MASD   ROS limited due to cognition  Objective: Vital Signs: Blood pressure (!) 146/85, pulse 98, temperature 97.6 F (36.4 C), temperature source Oral, resp. rate 18, height 5\' 7"  (1.702 m), weight 76.1 kg (167 lb 12.8 oz), SpO2 98 %. No results found. No results for input(s): WBC, HGB, HCT, PLT in the last 72 hours. No results for input(s): NA, K, CL, GLUCOSE, BUN, CREATININE, CALCIUM in the last 72 hours.  Invalid input(s): CO CBG (last 3)   Recent Labs  04/23/16 1638 04/23/16 2042 04/24/16 0651  GLUCAP 96 194* 219*    Wt Readings from Last 3 Encounters:  04/24/16 76.1 kg (167 lb 12.8 oz)  04/05/16 73.3 kg (161 lb 9.6 oz)  01/15/16 75.2 kg (165 lb 12.8 oz)    Physical Exam:  Constitutional: lying comfortably in bed HENT:  Head: Normocephalic and atraumatic.  Right Ear: External ear normal.  Left Ear: External ear normal.  Mouth/Throat: Oropharynx is clear and moist.  Eyes: Conjunctivae and EOM are normal. Pupils are equal, round, and reactive to light. No scleral icterus.  Neck: No JVD present. No tracheal deviation present. No thyromegaly present.  Cardiovascular: Normal rate, regular rhythm and intact distal pulses.  Exam reveals no friction rub.   No murmur heard. Respiratory: Effort normal. No respiratory distress. She has no wheezes. She has no rales. She exhibits no tenderness.  GI: She exhibits no distension. There is no tenderness. There is no rebound.  Musculoskeletal: She exhibits no edema. Tenderness:.No pain with RUE ROM, Neurological: She is anxious/confused. abnormal muscle tone (MAS  biceps 2/4, wrist flexors 1/4. Pain with attempted finger ext and wrist ext and elbow ext Coordination normal.  RUE and RLE 4 to 4+/5. LUE underlying 1/4 deltoid, biceps, triceps, wrist and finger flexors.  LLE 1+ HF, KE and  0/5 ADF/PF.--no change   Skin: No rash noted. No erythema. No pallor.  Psychiatric: She has a normal mood and affect.  Finger flexors are relaxed today, pain with elbow ROM, tone related  Assessment/Plan: 1. 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 Physiatrist is providing close team supervision and 24 hour management of active medical problems listed below. Physiatrist and rehab team continue to assess barriers to discharge/monitor patient progress toward functional and medical goals.  Function:  Bathing Bathing position   Position: Wheelchair/chair at sink  Bathing parts Body parts bathed by patient: Left arm, Chest, Abdomen, Right upper leg, Left upper leg, Right lower leg Body parts bathed by helper: Left lower leg, Front perineal area, Right arm  Bathing assist Assist Level: Touching or steadying assistance(Pt > 75%)      Upper Body Dressing/Undressing Upper body dressing   What is the patient wearing?: Pull over shirt/dress     Pull over shirt/dress - Perfomed by patient: Thread/unthread right sleeve, Put head through opening Pull over shirt/dress - Perfomed by helper: Thread/unthread left sleeve, Pull shirt over trunk        Upper body assist Assist Level:  (Mod A)      Lower Body Dressing/Undressing Lower body dressing   What is the patient wearing?: Pants, Non-skid slipper socks, AFO, Ted Hose, Shoes Underwear - Performed by patient: Thread/unthread right underwear leg Underwear - Performed by helper: Thread/unthread  left underwear leg, Pull underwear up/down Pants- Performed by patient: Thread/unthread right pants leg Pants- Performed by helper: Thread/unthread left pants leg, Pull pants up/down Non-skid slipper socks- Performed by patient: Don/doff right sock Non-skid slipper socks- Performed by helper: Don/doff  left sock   Socks - Performed by helper: Don/doff right sock, Don/doff left sock Shoes - Performed by patient: Don/doff right shoe Shoes - Performed by helper: Don/doff right shoe, Don/doff left shoe, Fasten right, Fasten left (Right shoe donned due to time )   AFO - Performed by helper: Don/doff right AFO   TED Hose - Performed by helper: Don/doff right TED hose, Don/doff left TED hose  Lower body assist Assist for lower body dressing:  (Max A)      Toileting Toileting Toileting activity did not occur: No continent bowel/bladder event   Toileting steps completed by helper: Adjust clothing prior to toileting, Performs perineal hygiene, Adjust clothing after toileting Toileting Assistive Devices: Grab bar or rail  Toileting assist Assist level: Two helpers   Transfers Chair/bed transfer   Chair/bed transfer method: Squat pivot Chair/bed transfer assist level: Touching or steadying assistance (Pt > 75%) Chair/bed transfer assistive device: Armrests, Bedrails     Locomotion Ambulation     Max distance: 20 Assist level: Touching or steadying assistance (Pt > 75%)   Wheelchair   Type: Manual Max wheelchair distance: 150 Assist Level: Supervision or verbal cues  Cognition Comprehension Comprehension assist level: Understands basic 50 - 74% of the time/ requires cueing 25 - 49% of the time  Expression Expression assist level: Expresses basic 75 - 89% of the time/requires cueing 10 - 24% of the time. Needs helper to occlude trach/needs to repeat words.  Social Interaction Social Interaction assist level: Interacts appropriately 25 - 49% of time - Needs frequent redirection.  Problem Solving Problem solving assist level: Solves basic 25 - 49% of the time - needs direction more than half the time to initiate, plan or complete simple activities  Memory Memory assist level: Recognizes or recalls less than 25% of the time/requires cueing greater than 75% of the time   Medical Problem  List and Plan: 1.  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  DVT Prophylaxis/Anticoagulation: Lovenox. Monitor for any bleeding episode  3. Pain Management with recent fall and persistent rib cage pain. Also has had prominent spasms/cramping at night  -Lidoderm patch increase to 3 patches Baclofen15 mg 3 times a day helping with tone, Robaxin as needed.  -off Lyrica 4. Mood: Zoloft 50 mg daily, 5. Neuropsych: This patient is not capable of making decisions on her own behalf.   -HS confusion/altered sleep/wake cycle which has carried over from acute to admission here.  -increase melatonin to 6mg  QHS (home medication)  -keep sleep chart  -need to re-establish sleep wake cycle  -all labs reviewed today  -ua/ucx negative 6. Skin/Wound Care: Routine skin checks 7. 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 Hemoglobin A1c 6.7.  Check blood sugars before meals and at bedtime  -sugars low given sparce po intake  -  increase levemir 30U qhs, am CBG elevated due to Nocturnal TF. Increase to 33U  -Start Glucophage 1000 mg BID 11/6 CBG (last 3)   Recent Labs  04/23/16 1638 04/23/16 2042 04/24/16 0651  GLUCAP 96 194* 219*    9.Hypertension with bouts of  orthostasis. Presently on Norvasc 5 mg daily. Patient also on lisinopril 40 mg daily as well as Accuretic prior to admission. Resume as needed 10 Hyperlipidemia .Zocor 40 mg daily 12. Spastic left hemiparesis, acute on chronic             -baclofen just resumed , Elbow flexor spasticity better, still has pain with PROM FFs             -consider botox injections, discussed with husband who is comfortable with that             -continue  splinting/ROM-s/p Botox to Elbow and wrist flexors 11/10 13. Poor po intake/altered mentation  -NGT placed- TF at noc ,Trial Megace, cal ct per dietary   -continue TF at night---encourage PO during the day,  -anxiety/cognitive component most prominently  14.Basilar stenosis Plavix per neuro     LOS (Days) 16 A FACE TO FACE EVALUATION WAS PERFORMED  Erick ColaceKIRSTEINS,Shaquinta Peruski E 04/24/2016 8:53 AM

## 2016-04-24 NOTE — Progress Notes (Signed)
Occupational Therapy Session Note  Patient Details  Name: Shelly Mcdaniel MRN: [id number] Date of Birth: 1955-09-09  Today's Date: 04/24/2016 OT Individual Time: 1345-1440 OT Individual Time Calculation (min): 55 min     Short Term Goals:Week 2:  OT Short Term Goal 1 (Week 2): Pt will initate and complete UB bathing with min assist and no more than mod instructional cueing.  OT Short Term Goal 2 (Week 2): Pt will perform LB bathing with mod assist sit to stand.   OT Short Term Goal 3 (Week 2): Pt will maintain static sitting balance EOB with supervison in preparation for selfcare tasks and functional transfers.   OT Short Term Goal 4 (Week 2): Pt will complete stand pivot transfer to 3:1 with mod assist 2 consecutive sessions.    Skilled Therapeutic Interventions/Progress Updates:    Pt seen for OT session focusing on ADL re-training. Pt in supine upon arrival with daughter present, agreeable to tx session. She did not voice complaints of pain at rest, however, voiced shoulder hurt during mobility and functional tasks, repositioned for comfort.  She transferred to EOB and completed UB dressing task seated EOB. Required max multimodal cues for problem solving donning shirt and assist for L UE management.  Stand pivots completed throughout session with mod-max A and VCs for step pattern and safety awareness. Seated in w/c at sink, pt completed grooming tasks. Used L UE with hand over hand assist to hold toothbrush while applying toothpaste. Pt able to maintain attention to oral care task and with perseverative tendencies requiring mod-max cuing to cease task.  Attempted to have pt sit up in recliner at end of session to encourage upright tolerance. W/c cushion placed in recliner for comfort and positioned with pillows. However, pt cont to slide out of recliner and weight shifting to get weight off bottom, and cont to slip out of chair. Therefore, pt returned to bed and left in sidelying positioned  with pillows and wedge. Pt left in bed with daughter present.   Therapy Documentation Precautions:  Precautions Precautions: Fall Precaution Comments: painful left arm with moderate left hemiparesis  Required Braces or Orthoses: Other Brace/Splint Other Brace/Splint: resting hand splint for the left hand at night Restrictions Weight Bearing Restrictions: No  See Function Navigator for Current Functional Status.   Therapy/Group: Individual Therapy  Lewis, Irving Bloor C 04/24/2016, 6:57 AM

## 2016-04-24 NOTE — Plan of Care (Signed)
Problem: RH SKIN INTEGRITY Goal: RH STG SKIN FREE OF INFECTION/BREAKDOWN Free of breakdown and infection while on rehab with mod assist  Outcome: Not Progressing MASD bottom foam in use

## 2016-04-24 NOTE — Progress Notes (Signed)
Physical Therapy Note  Patient Details  Name: Shelly SimmerJody D Mcdaniel MRN: [id number] Date of Birth: [birth year] Today's Date: 04/24/2016    Time: 800-857 57 minutes  1:1 Pt c/o buttocks pain, RN made aware, meds given during session.  Rolling in bed for hygiene due to incontinence with min A to roll, increased time for initiation and motor planning.  Supine to sit with min A and increased time. Sit to stand multiple times with min A for dressing.  Stand pivot to w/c with min A for balance, cues for safety and sequencing.  Gait initially with hemi walker with min A, max cuing for safe placement and sequencing with hemi walker.  Gait with [facility] with pt much more easily able to sequence and perform gait x 40' with min A for LOB posterior and left. Pt requires mod cuing for safety with turning and backing up to sit.  Standing balance with forced use of Lt LE for reaching and squatting task.  W/c mobility with hemi technique min A x 150'.  Pt requires frequent rests due to fatigue but motivated to participate.   Keval Nam 04/24/2016, 8:58 AM

## 2016-04-25 ENCOUNTER — Inpatient Hospital Stay (HOSPITAL_COMMUNITY): Payer: BC Managed Care – PPO

## 2016-04-25 ENCOUNTER — Inpatient Hospital Stay (HOSPITAL_COMMUNITY): Payer: BC Managed Care – PPO | Admitting: Occupational Therapy

## 2016-04-25 ENCOUNTER — Inpatient Hospital Stay (HOSPITAL_COMMUNITY): Payer: BC Managed Care – PPO | Admitting: Speech Pathology

## 2016-04-25 ENCOUNTER — Inpatient Hospital Stay (HOSPITAL_COMMUNITY): Payer: BC Managed Care – PPO | Admitting: Physical Therapy

## 2016-04-25 LAB — GLUCOSE, CAPILLARY
GLUCOSE-CAPILLARY: 131 mg/dL — AB (ref 65–99)
GLUCOSE-CAPILLARY: 163 mg/dL — AB (ref 65–99)
Glucose-Capillary: 236 mg/dL — ABNORMAL HIGH (ref 65–99)
Glucose-Capillary: 168 mg/dL — ABNORMAL HIGH (ref 65–99)

## 2016-04-25 MED ORDER — JEVITY 1.5 CAL/FIBER PO LIQD
1000.0000 mL | ORAL | Status: DC
  Administered 2016-04-25 – 2016-04-26 (×2): 1000 mL
  Filled 2016-04-25 (×4): qty 1000

## 2016-04-25 MED ORDER — INSULIN DETEMIR 100 UNIT/ML ~~LOC~~ SOLN
36.0000 [IU] | Freq: Every day | SUBCUTANEOUS | Status: DC
  Administered 2016-04-25 – 2016-04-27 (×3): 36 [IU] via SUBCUTANEOUS
  Filled 2016-04-25 (×5): qty 0.36

## 2016-04-25 NOTE — Progress Notes (Signed)
Shelly Mcdaniel PHYSICAL MEDICINE & REHABILITATION     PROGRESS NOTE    Subjective/Complaints: No issue overnite per RN, skin tear is small but pt very hypersensitive in that area LUE improved   ROS limited due to cognition  Objective: Vital Signs: Blood pressure (!) 145/81, pulse 86, temperature 98 F (36.7 C), temperature source Oral, resp. rate 18, height 5\' 7"  (1.702 m), weight 75 kg (165 lb 6.4 oz), SpO2 98 %. No results found. No results for input(s): WBC, HGB, HCT, PLT in the last 72 hours. No results for input(s): NA, K, CL, GLUCOSE, BUN, CREATININE, CALCIUM in the last 72 hours.  Invalid input(s): CO CBG (last 3)   Recent Labs  04/24/16 1704 04/24/16 2029 04/25/16 0635  GLUCAP 255* 116* 236*    Wt Readings from Last 3 Encounters:  04/25/16 75 kg (165 lb 6.4 oz)  04/05/16 73.3 kg (161 lb 9.6 oz)  01/15/16 75.2 kg (165 lb 12.8 oz)    Physical Exam:  Constitutional: lying comfortably in bed HENT:  Head: Normocephalic and atraumatic.  Right Ear: External ear normal.  Left Ear: External ear normal.  Mouth/Throat: Oropharynx is clear and moist.  Eyes: Conjunctivae and EOM are normal. Pupils are equal, round, and reactive to light. No scleral icterus.  Neck: No JVD present. No tracheal deviation present. No thyromegaly present.  Cardiovascular: Normal rate, regular rhythm and intact distal pulses.  Exam reveals no friction rub.   No murmur heard. Respiratory: Effort normal. No respiratory distress. She has no wheezes. She has no rales. She exhibits no tenderness.  GI: She exhibits no distension. There is no tenderness. There is no rebound.  Musculoskeletal: She exhibits no edema. Tenderness:.No pain with RUE ROM, Neurological: She is anxious/confused. abnormal muscle tone (MAS  biceps 2/4, wrist flexors 1/4. Pain with attempted finger ext and wrist ext and elbow ext Coordination normal.  RUE and RLE 4 to 4+/5. LUE underlying 1/4 deltoid, biceps, triceps, wrist and  finger flexors.  LLE 1+ HF, KE and 0/5 ADF/PF.--no change   Skin: small sup aspect of gluteal cleft tear and surrounding erythema  Psychiatric: She has a normal mood and affect.  Finger flexors are relaxed today, pain with elbow ROM, tone related  Assessment/Plan: 1. 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 Physiatrist is providing close team supervision and 24 hour management of active medical problems listed below. Physiatrist and rehab team continue to assess barriers to discharge/monitor patient progress toward functional and medical goals.  Function:  Bathing Bathing position   Position: Wheelchair/chair at sink  Bathing parts Body parts bathed by patient: Left arm, Chest, Abdomen, Right upper leg, Left upper leg, Right lower leg Body parts bathed by helper: Left lower leg, Front perineal area, Right arm  Bathing assist Assist Level: Touching or steadying assistance(Pt > 75%)      Upper Body Dressing/Undressing Upper body dressing   What is the patient wearing?: Pull over shirt/dress     Pull over shirt/dress - Perfomed by patient: Thread/unthread right sleeve Pull over shirt/dress - Perfomed by helper: Thread/unthread left sleeve, Pull shirt over trunk, Put head through opening        Upper body assist Assist Level: 2 helpers      Lower Body Dressing/Undressing Lower body dressing   What is the patient wearing?: Pants, Non-skid slipper socks, AFO, Ted Hose, Shoes Underwear - Performed by patient: Thread/unthread right  underwear leg Underwear - Performed by helper: Thread/unthread left underwear leg, Pull underwear up/down Pants- Performed by patient: Thread/unthread right pants leg Pants- Performed by helper: Thread/unthread left pants leg, Pull pants up/down Non-skid slipper socks- Performed by patient: Don/doff right  sock Non-skid slipper socks- Performed by helper: Don/doff left sock   Socks - Performed by helper: Don/doff right sock, Don/doff left sock Shoes - Performed by patient: Don/doff right shoe Shoes - Performed by helper: Don/doff right shoe, Don/doff left shoe, Fasten right, Fasten left (Right shoe donned due to time )   AFO - Performed by helper: Don/doff right AFO   TED Hose - Performed by helper: Don/doff right TED hose, Don/doff left TED hose  Lower body assist Assist for lower body dressing:  (Max A)      Toileting Toileting Toileting activity did not occur: No continent bowel/bladder event Toileting steps completed by patient: Performs perineal hygiene Toileting steps completed by helper: Adjust clothing prior to toileting, Adjust clothing after toileting Toileting Assistive Devices: Grab bar or rail  Toileting assist Assist level: Touching or steadying assistance (Pt.75%)   Transfers Chair/bed transfer   Chair/bed transfer method: Stand pivot Chair/bed transfer assist level: Moderate assist (Pt 50 - 74%/lift or lower) Chair/bed transfer assistive device: Cane, Designer, fashion/clothing     Max distance: 20 Assist level: Touching or steadying assistance (Pt > 75%)   Wheelchair   Type: Manual Max wheelchair distance: 150 Assist Level: Supervision or verbal cues  Cognition Comprehension Comprehension assist level: Understands basic 50 - 74% of the time/ requires cueing 25 - 49% of the time  Expression Expression assist level: Expresses basic 75 - 89% of the time/requires cueing 10 - 24% of the time. Needs helper to occlude trach/needs to repeat words.  Social Interaction Social Interaction assist level: Interacts appropriately 25 - 49% of time - Needs frequent redirection.  Problem Solving Problem solving assist level: Solves basic 25 - 49% of the time - needs direction more than half the time to initiate, plan or complete simple activities  Memory Memory assist  level: Recognizes or recalls 25 - 49% of the time/requires cueing 50 - 75% of the time   Medical Problem List and Plan: 1.  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  DVT Prophylaxis/Anticoagulation: Lovenox. Monitor for any bleeding episode  3. Pain Management with recent fall and persistent rib cage pain. Also has had prominent spasms/cramping at night  -Lidoderm patch increase to 3 patches Baclofen15 mg 3 times a day helping with tone, Robaxin as needed.  -off Lyrica 4. Mood: Zoloft 50 mg daily, 5. Neuropsych: This patient is not capable of making decisions on her own behalf.   -HS confusion/altered sleep/wake cycle which has carried over from acute to admission here.  -increase melatonin to 6mg  QHS (home medication)  -keep sleep chart  -need to re-establish sleep wake cycle  -all labs reviewed today  -ua/ucx negative 6. Skin/Wound Care: Routine skin checks,7. 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 Hemoglobin A1c 6.7.  Check blood  sugars before meals and at bedtime  -sugars low given sparce po intake  -increase levemir 30U qhs, am CBG elevated due to Nocturnal TF. Increase to 36U  -Start Glucophage 1000 mg BID 11/6 CBG (last 3)   Recent Labs  04/24/16 1704 04/24/16 2029 04/25/16 0635  GLUCAP 255* 116* 236*    9.Hypertension with bouts of  orthostasis. Presently on Norvasc 5 mg daily. Patient also on lisinopril 40 mg daily as well as Accuretic prior to admission. Resume as needed 10 Hyperlipidemia .Zocor 40 mg daily 12. Spastic left hemiparesis, acute on chronic             -baclofen just resumed , Elbow flexor spasticity better, still has pain with PROM FFs             -consider  botox injections, discussed with husband who is comfortable with that             -continue splinting/ROM-s/p Botox to Elbow and wrist flexors 11/10 13. Poor po intake/altered mentation  -NGT placed- TF at noc ,Trial Megace, cal ct per dietary, improving 25-50% meals   -continue TF at night---encourage PO during the day,  -anxiety/cognitive component most prominently  14.Basilar stenosis Plavix per neuro     LOS (Days) 17 A FACE TO FACE EVALUATION WAS PERFORMED  Erick ColaceKIRSTEINS,Cindee Mclester E 04/25/2016 6:49 AM

## 2016-04-25 NOTE — Progress Notes (Signed)
Social Work Patient ID: Shelly Mcdaniel, female   DOB: 04/07/1956, 60 y.o.   MRN: [id number]  Met with pt and husband to discuss home health versus OP therapies, and the fact they would not have  RN to check the wound if she does OP. They have a recommendation for [facility] but still want to think about it and get back with this worker. Husband is aware he can not leave pt alone and needs to have someone with her. As before he could leave her for a  Few hours at a time.  Pt still has  NG tube will discuss in team conference on Wed. Will contact OP to see if there is a waiting list due to the holidays coming up. Will work on discharge needs.  

## 2016-04-25 NOTE — Progress Notes (Signed)
Late entry: RN notified by Occupational therapist that pt had removed NG tube. Nurse recently rounding on pt. At time of rounding, pt was in bed with bed alarm awaiting therapy session. Pt denied removal of NG tube, stating 'I don't know who did it'. [facility] team notified, tube replaced, and placement verified by radiology.  Pt contacted husband, to report that tube was being replaced. Husband in turn, contacted nurse to verify if this was accurate. Nurse confirmed that tube had been removed and replaced. However, explained that we were awaiting verification of placement before notifying him. Husband verbalized understanding. Nurse also discussed with husband the need to apply soft mittens to possibly prevent future removal of NG tube. Husband agreeable. However, when discussed with pt, she refused, verbalizing that "I will not be treated a like a prisoner." Therefore, soft mitten not placed. Son in room with pt during last nursing round. pt again refusing placement of soft mittens, verbalizing again that she would not be 'treated like a prisoner'..Marland Kitchen

## 2016-04-25 NOTE — Plan of Care (Signed)
Problem: RH BLADDER ELIMINATION Goal: RH STG MANAGE BLADDER WITH ASSISTANCE STG Manage Bladder With mod Assistance   Outcome: Not Progressing Continues to be incontinent of urine,

## 2016-04-25 NOTE — Progress Notes (Signed)
Physical Therapy Session Note  Patient Details  Name: Shelly Mcdaniel MRN: [id number] Date of Birth: 28-Feb-1956  Today's Date: 04/25/2016 PT Individual Time: 0900-1000 PT Individual Time Calculation (min): 60 min    Skilled Therapeutic Interventions/Progress Updates:   Pt received supine in bed, c/o pain as below due to positioning in bed and agreeable to treatment with encouragement. Supine>sit with min guard using bedrails and increased time due to pt distractibility and hyperverbal requiring redirection. Seated on EOB performed upper and lower body dressing with modA overall, including changing brief after incontinent bladder accident. Stand pivot bed>w/c with quad cane and minA. Gait in/out of bathroom with quad cane and minA overall, however several posterior LOBs requiring modA to recover. Pt emotionally labile throughout session. Gait with quad cane and modA x40' again with several posterior LOBs. Remained seated in w/c at end of session, husband present and all needs in reach.   Therapy Documentation Precautions:  Precautions Precautions: Fall Precaution Comments: painful left arm with moderate left hemiparesis  Required Braces or Orthoses: Other Brace/Splint Other Brace/Splint: resting hand splint for the left hand at night Restrictions Weight Bearing Restrictions: No Pain: Pain Assessment Pain Assessment: Faces Faces Pain Scale: Hurts little more Pain Type: Acute pain Pain Location: Buttocks Pain Descriptors / Indicators: Aching Pain Intervention(s): Repositioned   See Function Navigator for Current Functional Status.   Therapy/Group: Individual Therapy  Vista Lawmanlizabeth J Tygielski 04/25/2016, 10:49 AM

## 2016-04-25 NOTE — Progress Notes (Signed)
Occupational Therapy Session Note  Patient Details  Name: Shelly Mcdaniel MRN: [id number] Date of Birth: 09-30-1955  Today's Date: 04/25/2016 OT Individual Time: 1400-1444 OT Individual Time Calculation (min): 44 min      Skilled Therapeutic Interventions/Progress Updates:     Upon entering the room, pt supine in bed with no c/o pain. Pt reports she does not feel well but is agreeable to OT intervention from bed level. OT noticed NG tube out of nose and alerted RN immediately. Pt reporting that she was unaware of it coming out prior to OT arrival. Pt oriented to hospital, self, and situation. Pt needing choice of 2 for correct date, year, and day of the week. Pt able to name upcoming holiday and listed a few items she normally cooks this time of year with min questioning cues. Pt engaged in self ROM exercises for L UE for elbow flex/ext, wrist ext/flex, and digits. Pt remained in bed with bed alarm activated and call bell within reach.   Therapy Documentation Precautions:  Precautions Precautions: Fall Precaution Comments: painful left arm with moderate left hemiparesis  Required Braces or Orthoses: Other Brace/Splint Other Brace/Splint: resting hand splint for the left hand at night Restrictions Weight Bearing Restrictions: No General:   Vital Signs: Therapy Vitals Temp: 98.5 F (36.9 C) Temp Source: Oral Pulse Rate: 94 Resp: 18 BP: 137/82 Patient Position (if appropriate): Lying Oxygen Therapy SpO2: 91 % O2 Device: Not Delivered  See Function Navigator for Current Functional Status.   Therapy/Group: Individual Therapy  Alen BleacherBradsher, Flor Houdeshell P 04/25/2016, 4:41 PM

## 2016-04-25 NOTE — Plan of Care (Signed)
Problem: RH SKIN INTEGRITY Goal: RH STG SKIN FREE OF INFECTION/BREAKDOWN Free of breakdown and infection while on rehab with mod assist  Outcome: Not Progressing MASD wound to sacrum with foam dressing.

## 2016-04-25 NOTE — Progress Notes (Signed)
Speech Language Pathology Daily Session Note  Patient Details  Name: Shelly Mcdaniel MRN: [id number] Date of Birth: [birth year]  Today's Date: 04/25/2016 SLP Individual Time: 0802-0900 SLP Individual Time Calculation (min): 58 min   Short Term Goals:Week 3: SLP Short Term Goal 1 (Week 3): Pt will sustain her attention to basic, familiar tasks for 2-3 minute intervals with mod assist multimodal cues for redirection.   SLP Short Term Goal 2 (Week 3): Pt will intiate and complete basic, familiar tasks with no more than 5 verbal cues for sequencing and organization.  SLP Short Term Goal 3 (Week 3): Pt will utilize external aids to orient to place, date, and situation with min assist verbal cues.   SLP Short Term Goal 4 (Week 3): Pt will utilize call bell to convey needs and wants to caregivers with max assist verbal cues.  SLP Short Term Goal 5 (Week 3): Pt will consume regular textures and thin liquids with min assist for swallow initiation and clearance of oral cavity.  Skilled Therapeutic Interventions:  Pt was seen for skilled ST targeting cognitive and dysphagia goals.  Pt bright and alert this morning.  She needed mod assist verbal cues when taking her medications crushed in puree due to oral holding resulting from decreased attention to boluses.  Pt demonstrated improved task initiation and sequencing of self feeding with min-mod assist verbal cues during breakfast meal.  She had mild left sided pocketing of solids which is likely resultant from prolonged mastication in the setting of decreased attention to boluses.  Residuals were cleared with min cues for a liquid wash.  No overt s/s of aspiration were evident with solids or liquids during meal.  Pt was able to sustain her attention to task for ~1-2 minute intervals with min-mod assist verbal cues for redirection to task.  Pt was also able to effectively problem solve/reason repositioning in bed to relieve pressure from buttocks with supervision  question cues.  Pt was left in bed with call bell within reach and bed alarm set.  Continue per current plan of care.         Function:  Eating Eating   Modified Consistency Diet: No Eating Assist Level: Supervision or verbal cues;Set up assist for   Eating Set Up Assist For: Opening containers;Cutting food       Cognition Comprehension Comprehension assist level: Understands basic 75 - 89% of the time/ requires cueing 10 - 24% of the time  Expression   Expression assist level: Expresses basic 75 - 89% of the time/requires cueing 10 - 24% of the time. Needs helper to occlude trach/needs to repeat words.  Social Interaction Social Interaction assist level: Interacts appropriately 50 - 74% of the time - May be physically or verbally inappropriate.  Problem Solving Problem solving assist level: Solves basic 50 - 74% of the time/requires cueing 25 - 49% of the time  Memory Memory assist level: Recognizes or recalls 25 - 49% of the time/requires cueing 50 - 75% of the time    Pain Pain Assessment Pain Assessment: Faces Faces Pain Scale: Hurts little more Pain Type: Acute pain Pain Location: Buttocks Pain Descriptors / Indicators: Aching Pain Intervention(s): Repositioned  Therapy/Group: Individual Therapy  Shelly Mcdaniel, Shelly Mcdaniel 04/25/2016, 9:00 AM

## 2016-04-25 NOTE — Progress Notes (Signed)
Calorie Count Note  48 hour calorie count ordered.  Diet: Carbohydrate modified diet with thin liquids Supplements:  -Glucerna Shake po TID, each supplement provides 220 kcal and 10 grams of protein. -Magic cup between meals, each supplement provides 290 kcal and 9 grams of protein.  Day 1: Breakfast: 215 kcal, 10 grams of protein Lunch: 17 kcal, 1 gram of protein Dinner: 293 kcal, 8 grams of protein Supplements: 440 kcal, 20 grams of protein  Day 1 Total intake: 965 kcal (54% of minimum estimated needs)  39 grams of protein (52% of minimum estimated needs)  Day 2: Breakfast: 125 kcal, 5 grams of protein Lunch: 355 kcal, 12 grams of protein Dinner: 134 kcal, 4 grams of protein Supplements: 440 kcal, 20 grams of protein  Day 2 Total intake: 1044 kcal (58% of minimum estimated needs)  41 grams of protein (55% of minimum estimated needs)  Estimated Nutritional Needs:  Kcal:  1800-2050 Protein:  75-90 grams Fluid:  1.8-2.0 L  Pt has been meeting 50-60% of estimated nutrition needs. RD to adjust tube feeds to provide the other 50% of estimated needs to adequately provide full nutrition to the patient.   Nutrition Dx:  Inadequate oral intake related to lethargy/confusion, nausea as evidenced by  (No meal intake x 4 days); diet advanced; progressing  Goal:  Pt to meet >/= 90% of their estimated nutrition needs; met via TF  Intervention:  -Continue Glucerna Shake po TID, each supplement provides 220 kcal and 10 grams of protein -Decrease nocturnal tube feeds of Jevity 1.5 to new goal rate of 50ml/hr x 12 hours (5pm-5am)via [facility] NGT  toprovide 900kcal (50% of needs), 38grams of protein, and 456ml of H2O.  -Continue free water flushes of 100 ml QID per tube. -Provide Magic cup betweenmeals, each supplement provides 290 kcal and 9 grams of protein. -Encourage adequate PO intake.   Stephanie Craig, MS, RD, LDN Pager # [phone] After hours/ weekend pager #  [phone]   

## 2016-04-26 ENCOUNTER — Encounter: Payer: BC Managed Care – PPO | Admitting: Occupational Therapy

## 2016-04-26 ENCOUNTER — Inpatient Hospital Stay (HOSPITAL_COMMUNITY): Payer: BC Managed Care – PPO | Admitting: Speech Pathology

## 2016-04-26 ENCOUNTER — Inpatient Hospital Stay (HOSPITAL_COMMUNITY): Payer: BC Managed Care – PPO | Admitting: Occupational Therapy

## 2016-04-26 ENCOUNTER — Ambulatory Visit (HOSPITAL_COMMUNITY): Payer: BC Managed Care – PPO | Admitting: Speech Pathology

## 2016-04-26 ENCOUNTER — Ambulatory Visit: Payer: BC Managed Care – PPO | Admitting: Physical Therapy

## 2016-04-26 ENCOUNTER — Inpatient Hospital Stay (HOSPITAL_COMMUNITY): Payer: BC Managed Care – PPO | Admitting: Physical Therapy

## 2016-04-26 LAB — URINE CULTURE

## 2016-04-26 LAB — GLUCOSE, CAPILLARY
GLUCOSE-CAPILLARY: 158 mg/dL — AB (ref 65–99)
Glucose-Capillary: 126 mg/dL — ABNORMAL HIGH (ref 65–99)
Glucose-Capillary: 133 mg/dL — ABNORMAL HIGH (ref 65–99)
Glucose-Capillary: 156 mg/dL — ABNORMAL HIGH (ref 65–99)

## 2016-04-26 LAB — URINE MICROSCOPIC-ADD ON

## 2016-04-26 LAB — URINALYSIS, ROUTINE W REFLEX MICROSCOPIC
Bilirubin Urine: NEGATIVE
Glucose, UA: NEGATIVE mg/dL
KETONES UR: NEGATIVE mg/dL
NITRITE: POSITIVE — AB
PH: 7.5 (ref 5.0–8.0)
PROTEIN: NEGATIVE mg/dL
Specific Gravity, Urine: 1.014 (ref 1.005–1.030)

## 2016-04-26 NOTE — Consult Note (Addendum)
Reason for Consult: Re-consult requested for buttocks by husband at the bedside.  Previous consult was performed on 11/10; refer to progress notes.  Wound type: Inner gluteal fold/bilat buttocks with red, macerated skin, basically unchanged since previous assessment, affected area is 5X1.5cm.  She now has a partial thickness fissure; pink and moist approx 3X.1X.1cm. Appearance is consistent with moisture associated skin damage, this is NOT a pressure injury. Drainage (amount, consistency, odor) Small amt yellow drainage. Dressing procedure/placement/frequency: Foam dressing to absorb drainage and promote healing. There is a gel cushion in the wheelchair to reduce pressure when OOB and family has brought in a turning wedge from home for turning off the affected area. Discussed plan of care with patient and family member at the bedside. Pt has been having frequent loose stools and is incontinent at times.  If this continues to occur, it is best to leave the foam dressing off since it can trap stool against the skin, and apply barrier cream to protect the skin.  The patient and husband prefer that a 5X5 foam dressing be applied instead of the sacral dressing.  Reviewed avoiding wearing briefs when patient is not OOB with therapy, since this can trap moisture against the skin. They verbalized understanding. Encouraged increased protein intake. Please re-consult if further assistance is needed.  Thank-you,  Cammie Mcgeeawn Alany Borman MSN, RN, CWOCN, [place], CNS [phone]

## 2016-04-26 NOTE — Progress Notes (Signed)
[facility]IC[facility] RE[facility]ABILITATION     PROGRESS NOTE    Subjective/Complaints: Appreciate calorie count, TF rate decreased by 50% by dietary   ROS limited due to cognition  Objective: Vital Signs: Blood pressure (!) 130/95, pulse 95, temperature 98 F (36.7 C), temperature source Oral, resp. rate 18, height 5\' 7"  (1.702 m), weight 75.6 kg (166 lb 10.7 oz), SpO2 100 %. Dg Abd Portable 1v  Result Date: 04/25/2016 CLINICAL DATA:  Feeding tube placement EXAM: PORTABLE ABDOMEN - 1 VIEW COMPARISON:  04/15/2016 FINDINGS: Feeding tube is in place, tip overlying the level of the distal stomach or duodenum bulb. Bowel gas pattern is nonobstructive. IMPRESSION: Feeding tube to distal stomach/ proximal duodenum. Electronically Signed   By: Norva PavlovElizabeth  Brown M.D.   On: 04/25/2016 16:36   No results for input(s): WBC, HGB, HCT, PLT in the last 72 hours. No results for input(s): NA, K, CL, GLUCOSE, BUN, CREATININE, CALCIUM in the last 72 hours.  Invalid input(s): CO CBG (last 3)   Recent Labs  04/25/16 1625 04/25/16 2202 04/26/16 0641  GLUCAP 131* 163* 158*    Wt Readings from Last 3 Encounters:  04/26/16 75.6 kg (166 lb 10.7 oz)  04/05/16 73.3 kg (161 lb 9.6 oz)  01/15/16 75.2 kg (165 lb 12.8 oz)    Physical Exam:  Constitutional: lying comfortably in bed HENT:  Head: Normocephalic and atraumatic.  Right Ear: External ear normal.  Left Ear: External ear normal.  Mouth/Throat: Oropharynx is clear and moist.  Eyes: Conjunctivae and EOM are normal. Pupils are equal, round, and reactive to light. No scleral icterus.  Neck: No JVD present. No tracheal deviation present. No thyromegaly present.  Cardiovascular: Normal rate, regular rhythm and intact distal pulses.  Exam reveals no friction rub.   No murmur heard. Respiratory: Effort normal. No respiratory distress. She has no wheezes. She has no rales. She exhibits no tenderness.  GI: She exhibits no distension. There is no  tenderness. There is no rebound.  Musculoskeletal: She exhibits no edema. Tenderness:.No pain with RUE ROM, Neurological: She is anxious/confused. abnormal muscle tone (MAS  biceps 2/4, wrist flexors 1/4. Pain with attempted finger ext and wrist ext and elbow ext, overall improved Coordination normal.  RUE and RLE 4 to 4+/5. LUE underlying 1/4 deltoid, biceps, triceps, wrist and finger flexors.  LLE 2- HF, KE and 2-/5 PF  Skin: small sup aspect of gluteal cleft tear and surrounding erythema  Psychiatric: She has a normal mood and affect.  Finger flexors are relaxed today, pain with elbow ROM, tone related  Assessment/Plan: 1. 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 Physiatrist is providing close team supervision and 24 hour management of active medical problems listed below. Physiatrist and rehab team continue to assess barriers to discharge/monitor patient progress toward functional and medical goals.  Function:  Bathing Bathing position   Position: Wheelchair/chair at sink  Bathing parts Body parts bathed by patient: Left arm, Chest, Abdomen, Right upper leg, Left upper leg, Right lower leg Body parts bathed by helper: Left lower leg, Front perineal area, Right arm  Bathing assist Assist Level: Touching or steadying assistance(Pt > 75%)      Upper Body Dressing/Undressing Upper body dressing   What is the patient wearing?: Pull over shirt/dress     Pull over shirt/dress - Perfomed by patient: Put head through opening, Pull shirt over trunk Pull over  shirt/dress - Perfomed by helper: Thread/unthread left sleeve, Thread/unthread right sleeve        Upper body assist Assist Level: Touching or steadying assistance(Pt > 75%)      Lower Body Dressing/Undressing Lower body dressing   What is the patient wearing?: Pants, Shoes,  AFO Underwear - Performed by patient: Thread/unthread right underwear leg Underwear - Performed by helper: Thread/unthread left underwear leg, Pull underwear up/down Pants- Performed by patient: Thread/unthread right pants leg Pants- Performed by helper: Thread/unthread left pants leg, Pull pants up/down, Fasten/unfasten pants Non-skid slipper socks- Performed by patient: Don/doff right sock Non-skid slipper socks- Performed by helper: Don/doff left sock   Socks - Performed by helper: Don/doff right sock, Don/doff left sock Shoes - Performed by patient: Don/doff right shoe Shoes - Performed by helper: Don/doff right shoe, Don/doff left shoe, Fasten right, Fasten left (Right shoe donned due to time )   AFO - Performed by helper: Don/doff right AFO   TED Hose - Performed by helper: Don/doff right TED hose, Don/doff left TED hose  Lower body assist Assist for lower body dressing:  (Max A)      Toileting Toileting Toileting activity did not occur: No continent bowel/bladder event Toileting steps completed by patient: Performs perineal hygiene Toileting steps completed by helper: Adjust clothing after toileting, Adjust clothing prior to toileting Toileting Assistive Devices: Grab bar or rail  Toileting assist Assist level: Touching or steadying assistance (Pt.75%)   Transfers Chair/bed transfer   Chair/bed transfer method: Stand pivot Chair/bed transfer assist level: Moderate assist (Pt 50 - 74%/lift or lower) Chair/bed transfer assistive device: Cane, Designer, fashion/clothing     Max distance: 40 Assist level: Moderate assist (Pt 50 - 74%)   Wheelchair   Type: Manual Max wheelchair distance: 150 Assist Level: Supervision or verbal cues  Cognition Comprehension Comprehension assist level: Understands basic 75 - 89% of the time/ requires cueing 10 - 24% of the time  Expression Expression assist level: Expresses basic 75 - 89% of the time/requires cueing 10 - 24% of the  time. Needs helper to occlude trach/needs to repeat words.  Social Interaction Social Interaction assist level: Interacts appropriately with others with medication or extra time (anti-anxiety, antidepressant).  Problem Solving Problem solving assist level: Solves basic 50 - 74% of the time/requires cueing 25 - 49% of the time  Memory Memory assist level: Recognizes or recalls 25 - 49% of the time/requires cueing 50 - 75% of the time   Medical Problem List and Plan: 1.  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  DVT Prophylaxis/Anticoagulation: Lovenox. Monitor for any bleeding episode  3. Pain Management with recent fall and persistent rib cage pain. Also has had prominent spasms/cramping at night  -Lidoderm patch increase to 3 patches Baclofen15 mg 3 times a day helping with tone, Robaxin as needed.  -off Lyrica 4. Mood: Zoloft 50 mg daily, 5. Neuropsych: This patient is not capable of making decisions on her own behalf.   -HS confusion/altered sleep/wake cycle which has carried over from acute to admission here.  -increase melatonin to 6mg  QHS (home medication)  -keep sleep chart  -need to re-establish sleep wake cycle  -all labs reviewed today  -ua/ucx negative 6. Skin/Wound Care: Routine skin checks,7. 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 Hemoglobin A1c 6.7.  Check blood sugars before meals and at bedtime  -sugars low given sparce po intake  -increase levemir   36U CBGs improving, Expect CBGs to reduce with lower to feeding rate  -Start Glucophage 1000 mg BID 11/6 CBG (last 3)   Recent Labs  04/25/16 1625 04/25/16 2202 04/26/16 0641  GLUCAP 131* 163* 158*    9.Hypertension  with bouts of  orthostasis. Presently on Norvasc 5 mg daily. Patient also on lisinopril 40 mg daily as well as Accuretic prior to admission. Resume as needed 10 Hyperlipidemia .Zocor 40 mg daily 12. Spastic left hemiparesis, acute on chronic             -baclofen just resumed , Elbow flexor spasticity better, still has pain with PROM FFs             -consider botox injections, discussed with husband who is comfortable with that             -continue splinting/ROM-s/p Botox to Elbow and wrist flexors 11/10 13. Poor po intake/altered mentation  -NGT placed- TF at noc ,Trial Megace, cal ct per dietary, improving ~50% meals   -continue TF at night---encourage PO during the day,  -anxiety/cognitive component most prominently  14.Basilar stenosis Plavix per neuro     LOS (Days) 18 A FACE TO FACE EVALUATION WAS PERFORMED  Cherye Gaertner E 04/26/2016 7:23 AM

## 2016-04-26 NOTE — Progress Notes (Signed)
Speech Language Pathology Daily Session Note  Patient Details  Name: Shelly Mcdaniel MRN: [id number] Date of Birth: 1955-11-02  Today's Date: 04/26/2016 SLP Individual Time: 1315-1400 SLP Individual Time Calculation (min): 45 min   Short Term Goals: Week 3: SLP Short Term Goal 1 (Week 3): Pt will sustain her attention to basic, familiar tasks for 2-3 minute intervals with mod assist multimodal cues for redirection.   SLP Short Term Goal 2 (Week 3): Pt will intiate and complete basic, familiar tasks with no more than 5 verbal cues for sequencing and organization.  SLP Short Term Goal 3 (Week 3): Pt will utilize external aids to orient to place, date, and situation with min assist verbal cues.   SLP Short Term Goal 4 (Week 3): Pt will utilize call bell to convey needs and wants to caregivers with max assist verbal cues.  SLP Short Term Goal 5 (Week 3): Pt will consume regular textures and thin liquids with min assist for swallow initiation and clearance of oral cavity.  Skilled Therapeutic Interventions:   Skilled treatment session focused on addressing dysphagia and cognition goals. SLP facilitated session by providing set-up of thin, clear liquids per patient request, given vomiting earlier in the day.  Patient required Max assist verbal and visual cues to initiate cup sips and Mod-Max assist verbal, visual, and tactile cues to initiate spoonfuls of broth.  In 30 minutes patient consumed 4 oz of thin liquid via cup and spoon with no oral holding or overt s/s of aspiration.  Patient also required Max assist verbal and visual cues for brief, ~30 seconds of sustained attention to task.  At end of session patient required Max assist verbal cues to demonstrate how to call for help with use of call bell.  Continue with current plan of care.   Function:  Eating Eating   Modified Consistency Diet: No Eating Assist Level: Supervision or verbal cues;Helper scoops food on utensil;Set up assist for;Help  managing cup/glass   Eating Set Up Assist For: Opening containers Helper Scoops Food on Utensil: Occasionally     Cognition Comprehension Comprehension assist level: Understands basic 75 - 89% of the time/ requires cueing 10 - 24% of the time  Expression   Expression assist level: Expresses basic 75 - 89% of the time/requires cueing 10 - 24% of the time. Needs helper to occlude trach/needs to repeat words.  Social Interaction Social Interaction assist level: Interacts appropriately 50 - 74% of the time - May be physically or verbally inappropriate.  Problem Solving Problem solving assist level: Solves basic 25 - 49% of the time - needs direction more than half the time to initiate, plan or complete simple activities  Memory Memory assist level: Recognizes or recalls 25 - 49% of the time/requires cueing 50 - 75% of the time    Pain Pain Assessment Pain Assessment: No/denies pain  Therapy/Group: Individual Therapy  Shelly Mcdaniel, M.A., Shelly Mcdaniel [phone]  Shelly Mcdaniel 04/26/2016, 4:18 PM

## 2016-04-26 NOTE — Progress Notes (Signed)
Physical Therapy Session Note  Patient Details  Name: JENEFER WOERNER MRN: [id number] Date of Birth: Oct 19, 1955  Today's Date: 04/26/2016 PT Individual Time: 0900-1000 PT Individual Time Calculation (min): 60 min    Short Term Goals: Week 3:     Skilled Therapeutic Interventions/Progress Updates:   Pt presented in bed sidelying on R with apparent episode of emesis with pt unaware. Pt with complaints of continued nausea however agreeable for therapy. After cleaning, pt min/modA sit to stand and ambulation 82f to sink and encouraged to stand at sink for hand washing. Pt able to maintain F balance while washing hands with x 1 UE.  Pt with mod cues for directional changes transferring to w/c. Stand pivot transfer to mat and sitting unsupported x8 min with tactile cues for maintaining upright position. Attempted second trial of ambulation however unable to perform further than 548fwithout fatigue. Pt returned to room and stand pivot performed to bed. Pt returned to laying position with HOB elevated and left with husband present and all needs met.   Therapy Documentation Precautions:  Precautions Precautions: Fall Precaution Comments: painful left arm with moderate left hemiparesis  Required Braces or Orthoses: Other Brace/Splint Other Brace/Splint: resting hand splint for the left hand at night Restrictions Weight Bearing Restrictions: No General:   Vital Signs:   Pain:   Mobility:   Locomotion :    Trunk/Postural Assessment :    Balance:   Exercises:   Other Treatments:     See Function Navigator for Current Functional Status.   Therapy/Group: Individual Therapy  Johnnisha Forton  Breianna Delfino, PTA  04/26/2016, 12:30 PM

## 2016-04-26 NOTE — Progress Notes (Signed)
[facility]     PROGRESS NOTE    Subjective/Complaints: Appreciate calorie count, TF rate decreased by 50% by dietary Incont loose BM yesterday  Was able to call husband last noc on hospital phone, pulled NG tube yest  ROS limited due to cognition  Objective: Vital Signs: Blood pressure (!) 130/95, pulse 95, temperature 98 F (36.7 C), temperature source Oral, resp. rate 18, height 5\' 7"  (1.702 m), weight 75.6 kg (166 lb 10.7 oz), SpO2 100 %. Dg Abd Portable 1v  Result Date: 04/25/2016 CLINICAL DATA:  Feeding tube placement EXAM: PORTABLE ABDOMEN - 1 VIEW COMPARISON:  04/15/2016 FINDINGS: Feeding tube is in place, tip overlying the level of the distal stomach or duodenum bulb. Bowel gas pattern is nonobstructive. IMPRESSION: Feeding tube to distal stomach/ proximal duodenum. Electronically Signed   By: Norva PavlovElizabeth  Brown M.D.   On: 04/25/2016 16:36   No results for input(s): WBC, HGB, HCT, PLT in the last 72 hours. No results for input(s): NA, K, CL, GLUCOSE, BUN, CREATININE, CALCIUM in the last 72 hours.  Invalid input(s): CO CBG (last 3)   Recent Labs  04/25/16 1625 04/25/16 2202 04/26/16 0641  GLUCAP 131* 163* 158*    Wt Readings from Last 3 Encounters:  04/26/16 75.6 kg (166 lb 10.7 oz)  04/05/16 73.3 kg (161 lb 9.6 oz)  01/15/16 75.2 kg (165 lb 12.8 oz)    Physical Exam:  Constitutional: lying comfortably in bed HENT:  Head: Normocephalic and atraumatic.  Right Ear: External ear normal.  Left Ear: External ear normal.  Mouth/Throat: Oropharynx is clear and moist.  Eyes: Conjunctivae and EOM are normal. Pupils are equal, round, and reactive to light. No scleral icterus.  Neck: No JVD present. No tracheal deviation present. No thyromegaly present.  Cardiovascular: Normal rate, regular rhythm and intact distal pulses.  Exam reveals no friction rub.   No murmur heard. Respiratory: Effort normal. No respiratory distress. She has no  wheezes. She has no rales. She exhibits no tenderness.  GI: She exhibits no distension. There is no tenderness. There is no rebound.  Musculoskeletal: She exhibits no edema. Tenderness:.No pain with RUE ROM, Neurological: She is anxious/confused. abnormal muscle tone (MAS  biceps 2/4, wrist flexors 1/4. Pain with attempted finger ext and wrist ext and elbow ext Coordination normal.  RUE and RLE 4 to 4+/5. LUE underlying 1/4 deltoid, biceps, triceps, wrist and finger flexors.  LLE 1+ HF, KE and 0/5 ADF/PF.--no change   Skin: small sup aspect of gluteal cleft tear and surrounding erythema  Psychiatric: She has a normal mood and affect.  Finger flexors are relaxed today, pain with elbow ROM, tone related  Assessment/Plan: 1. 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 Physiatrist is providing close team supervision and 24 hour management of active medical problems listed below. Physiatrist and rehab team continue to assess barriers to discharge/monitor patient progress toward functional and medical goals.  Function:  Bathing Bathing position   Position: Wheelchair/chair at sink  Bathing parts Body parts bathed by patient: Left arm, Chest, Abdomen, Right upper leg, Left upper leg, Right lower leg Body parts bathed by helper: Left lower leg, Front perineal area, Right arm  Bathing assist Assist Level: Touching or steadying assistance(Pt > 75%)      Upper Body Dressing/Undressing Upper body dressing   What is the patient wearing?: Pull over shirt/dress  Pull over shirt/dress - Perfomed by patient: Put head through opening, Pull shirt over trunk Pull over shirt/dress - Perfomed by helper: Thread/unthread left sleeve, Thread/unthread right sleeve        Upper body assist Assist Level: Touching or steadying assistance(Pt > 75%)      Lower  Body Dressing/Undressing Lower body dressing   What is the patient wearing?: Pants, Shoes, AFO Underwear - Performed by patient: Thread/unthread right underwear leg Underwear - Performed by helper: Thread/unthread left underwear leg, Pull underwear up/down Pants- Performed by patient: Thread/unthread right pants leg Pants- Performed by helper: Thread/unthread left pants leg, Pull pants up/down, Fasten/unfasten pants Non-skid slipper socks- Performed by patient: Don/doff right sock Non-skid slipper socks- Performed by helper: Don/doff left sock   Socks - Performed by helper: Don/doff right sock, Don/doff left sock Shoes - Performed by patient: Don/doff right shoe Shoes - Performed by helper: Don/doff right shoe, Don/doff left shoe, Fasten right, Fasten left (Right shoe donned due to time )   AFO - Performed by helper: Don/doff right AFO   TED Hose - Performed by helper: Don/doff right TED hose, Don/doff left TED hose  Lower body assist Assist for lower body dressing:  (Max A)      Toileting Toileting Toileting activity did not occur: No continent bowel/bladder event Toileting steps completed by patient: Performs perineal hygiene Toileting steps completed by helper: Adjust clothing after toileting, Adjust clothing prior to toileting Toileting Assistive Devices: Grab bar or rail  Toileting assist Assist level: Touching or steadying assistance (Pt.75%)   Transfers Chair/bed transfer   Chair/bed transfer method: Stand pivot Chair/bed transfer assist level: Moderate assist (Pt 50 - 74%/lift or lower) Chair/bed transfer assistive device: Cane, Designer, fashion/clothing     Max distance: 40 Assist level: Moderate assist (Pt 50 - 74%)   Wheelchair   Type: Manual Max wheelchair distance: 150 Assist Level: Supervision or verbal cues  Cognition Comprehension Comprehension assist level: Understands basic 75 - 89% of the time/ requires cueing 10 - 24% of the time  Expression  Expression assist level: Expresses basic 75 - 89% of the time/requires cueing 10 - 24% of the time. Needs helper to occlude trach/needs to repeat words.  Social Interaction Social Interaction assist level: Interacts appropriately with others with medication or extra time (anti-anxiety, antidepressant).  Problem Solving Problem solving assist level: Solves basic 50 - 74% of the time/requires cueing 25 - 49% of the time  Memory Memory assist level: Recognizes or recalls 25 - 49% of the time/requires cueing 50 - 75% of the time   Medical Problem List and Plan: 1.  Left spastic hemiplegia/dysarthria secondary to right pontine infarct with recent extension             -continue CIR PT, OT, SLP, team conf in am                 2.  DVT Prophylaxis/Anticoagulation: Lovenox. Monitor for any bleeding episode  3. Pain Management with recent fall and persistent rib cage pain. Also has had prominent spasms/cramping at night  -Lidoderm patch increase to 3 patches Baclofen15 mg 3 times a day helping with tone, Robaxin as needed.  -off Lyrica 4. Mood: Zoloft 50 mg daily, 5. Neuropsych: This patient is not capable of making decisions on her own behalf.   -HS confusion/altered sleep/wake cycle which has carried over from acute to admission here.  -increase melatonin to 6mg  QHS (home medication)  -keep sleep chart  -  need to re-establish sleep wake cycle  -all labs reviewed today  -ua/ucx negative 6. Skin/Wound Care: Routine skin checks,7. 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 Hemoglobin A1c 6.7.  Check blood sugars before meals and at bedtime  -sugars low given sparce po intake  -increase levemir   36U CBGs improving, expect CBG to decrease with TF rate decrease at noc  -Start Glucophage 1000 mg BID 11/6 CBG (last  3)   Recent Labs  04/25/16 1625 04/25/16 2202 04/26/16 0641  GLUCAP 131* 163* 158*    9.Hypertension with bouts of  orthostasis. Presently on Norvasc 5 mg daily. Patient also on lisinopril 40 mg daily as well as Accuretic prior to admission. Resume as needed 10 Hyperlipidemia .Zocor 40 mg daily 12. Spastic left hemiparesis, acute on chronic             -baclofen just resumed , Elbow flexor spasticity better, still has pain with PROM FFs             -consider botox injections, discussed with husband who is comfortable with that             -continue splinting/ROM-s/p Botox to Elbow and wrist flexors 11/10 13. Poor po intake/altered mentation  -NGT placed- TF at noc ,Trial Megace, cal ct per dietary, improving ~50% meals   -continue TF at night---encourage PO during the day,  -anxiety/cognitive component most prominently  14.Basilar stenosis Plavix per neuro     LOS (Days) 18 A FACE TO FACE EVALUATION WAS PERFORMED  Erick ColaceKIRSTEINS,Jerrilyn Messinger E 04/26/2016 7:58 AM

## 2016-04-26 NOTE — Progress Notes (Signed)
Occupational Therapy Weekly Progress Note  Patient Details  Name: Shelly Mcdaniel MRN: [id number] Date of Birth: 1956-05-27  Beginning of progress report period: April 17, 2016 End of progress report period: April 26, 2016  Today's Date: 04/26/2016 OT Individual Time: 3149-7026 OT Individual Time Calculation (min): 72 min     Patient has met 2 of 4 short term goals. Pt making progress this week towards occupational therapy goals. Pt with decreased reports of pain in L UE and she was able to perform self ROM during therapy session this week. Pt needing mod A for dressing with hemiplegic techniques. Pt continues to need overall mod multimodal cues for tasks. Pt continues to benefit from OT intervention  Patient continues to demonstrate the following deficits: decreased I in self care, functional transfers, functional mobility, increased tone, decreased functional use/strength in L UE, balance, cognition, safety awareness, attention, problem solving, initiation and therefore will continue to benefit from skilled OT intervention to enhance overall performance with BADL.  Patient progressing toward long term goals..  Continue plan of care.  OT Short Term Goals Week 3:  OT Short Term Goal 1 (Week 3): STGs=LTGs secondary to pt's upcoming discharge   Skilled Therapeutic Interventions/Progress Updates:    Upon entering the room, pt with no c/o pain. OT noticed pt has been incontinent of bowel in brief and pt ambulated to bathroom with cane and min A for safety. Pt needing assistance for clothing management and hygiene. Unable to perform hygiene well from toilet and therefore pt transferred into shower with mod A for stand pivot transfer. Pt performed sit <>stand in shower with lifting assistance and therapist assistance for thoroughness. Pt with increasing fatigue and pain in L UE as session progress. Pt engaged in hemiplegic dressing while seated in wheelchair at sink. Pt declined LB dressing  but needing mod A to don pull over shirt. Caregiver verbalized they owned givmohr sling and OT suggest he bring in order to support shoulder during functional tasks. OT seated in wheelchair with quick release belt donned and RN entering the room. All needs within reach.   Therapy Documentation Precautions: Precautions Precautions: Fall Precaution Comments: painful left arm with moderate left hemiparesis  Required Braces or Orthoses: Other Brace/Splint Other Brace/Splint: resting hand splint for the left hand at night Restrictions Weight Bearing Restrictions: No General:   Vital Signs: Therapy Vitals Temp: 98 F (36.7 C) Temp Source: Oral Pulse Rate: 95 Resp: 18 BP: 130/88 Patient Position (if appropriate): Lying Oxygen Therapy SpO2: 100 % O2 Device: Not Delivered Pain: Pain Assessment Pain Score: Asleep ADL:   Exercises:   Other Treatments:    See Function Navigator for Current Functional Status.   Therapy/Group: Individual Therapy  Gypsy Decant 04/26/2016, 8:14 AM

## 2016-04-26 NOTE — Progress Notes (Signed)
Patient noted to have loose bm this am by OT, Katie. Patient given zofran 4mg  po and vomited watery emesis in bed approximately 1000. Patient continues to report feeling "bad" today. Continue to monitor. D. Angiuill , PA aware. Continue with plan of care.  Cleotilde NeerJoyce, Douglas Smolinsky S

## 2016-04-26 NOTE — Progress Notes (Signed)
Occupational Therapy Session Note  Patient Details  Name: Shelly Mcdaniel MRN: [id number] Date of Birth: 11-Oct-1955  Today's Date: 04/26/2016 OT Individual Time: 1725-1755 OT Individual Time Calculation (min): 30 min    Skilled Therapeutic Interventions/Progress Updates:   Patient scrunched up into right side of bed with upper body and legs lying left upon approach for therapy (head of bed elevated).    She stated she was comfortable in that position.  She was able to transfer supine to edge of bed with CGA to mid back for tactile cueing and with extra time to intitate and followthrough with the command.  She was able to sit edge of bed with close supervision for fair static sitting balance.    She was able to complete sit to stand x 6 without complaints of pain - allowed pressure relief off bottom and work on neutral postural alignment standing.  She was able to go sit to stand and take two small steps to her left x6 (with rest in between) in order to help patient be higher in bed for when she would return to lying down in bed (son was on the way up and she wanted to rest off her bottom until he came rather than sit in  Chair at this time)  Patient was able to complete bed mobility with min A and extra time to process commands.  Then this clinician gently, gently provide Passive Range ofMotion to her shoulder, wrist and digits.    As well, a wash cloth with no rinse cleansers was place in her left palm to help decrease tone and freshen it (she was unable to tolerate getting it washed due to pain).   As well, a pilllow as placed behind left shoulder and under left forearm for support as she was position with pillows and bolster onto her right side for pressure relief.  Call bell was place within sight and she stated she knew to press it for help.    As well, bed alarm was set  Therapy Documentation Precautions:  Precautions Precautions: Fall Precaution Comments: painful left arm with  moderate left hemiparesis  Required Braces or Orthoses: Other Brace/Splint Other Brace/Splint: resting hand splint for the left hand at night Restrictions Weight Bearing Restrictions: No  Pain:" really hurts when I move every where"  Patient did not rate.   Patient stated she already had pain meds. See Function Navigator for Current Functional Status.   Therapy/Group: Individual Therapy  Bud Faceickett, Oneita Allmon [facility] CenterYeary 04/26/2016, 6:16 PM

## 2016-04-26 NOTE — Plan of Care (Signed)
Problem: RH SAFETY Goal: RH STG ADHERE TO SAFETY PRECAUTIONS W/ASSISTANCE/DEVICE STG Adhere to Safety Precautions With mod Assistance/Device.   Outcome: Progressing Mod assist

## 2016-04-27 ENCOUNTER — Inpatient Hospital Stay (HOSPITAL_COMMUNITY): Payer: BC Managed Care – PPO

## 2016-04-27 ENCOUNTER — Inpatient Hospital Stay (HOSPITAL_COMMUNITY): Payer: BC Managed Care – PPO | Admitting: Occupational Therapy

## 2016-04-27 ENCOUNTER — Inpatient Hospital Stay (HOSPITAL_COMMUNITY): Payer: BC Managed Care – PPO | Admitting: Physical Therapy

## 2016-04-27 ENCOUNTER — Encounter (HOSPITAL_COMMUNITY): Payer: BC Managed Care – PPO

## 2016-04-27 LAB — GLUCOSE, CAPILLARY
GLUCOSE-CAPILLARY: 260 mg/dL — AB (ref 65–99)
GLUCOSE-CAPILLARY: 159 mg/dL — AB (ref 65–99)
GLUCOSE-CAPILLARY: 123 mg/dL — AB (ref 65–99)
Glucose-Capillary: 123 mg/dL — ABNORMAL HIGH (ref 65–99)

## 2016-04-27 MED ORDER — CEPHALEXIN 250 MG PO CAPS
250.0000 mg | ORAL_CAPSULE | Freq: Three times a day (TID) | ORAL | Status: DC
  Administered 2016-04-27 – 2016-05-01 (×13): 250 mg via ORAL
  Filled 2016-04-27 (×13): qty 1

## 2016-04-27 MED ORDER — PRO-STAT SUGAR FREE PO LIQD
30.0000 mL | Freq: Three times a day (TID) | ORAL | Status: DC
  Administered 2016-04-27 – 2016-04-28 (×2): 30 mL via ORAL
  Filled 2016-04-27 (×5): qty 30

## 2016-04-27 MED ORDER — CIPROFLOXACIN HCL 250 MG PO TABS
250.0000 mg | ORAL_TABLET | Freq: Two times a day (BID) | ORAL | Status: DC
  Administered 2016-04-27: 250 mg via ORAL
  Filled 2016-04-27: qty 1

## 2016-04-27 MED ORDER — LISINOPRIL 10 MG PO TABS
10.0000 mg | ORAL_TABLET | Freq: Every day | ORAL | Status: DC
  Administered 2016-04-28 – 2016-05-01 (×4): 10 mg via ORAL
  Filled 2016-04-27 (×4): qty 1

## 2016-04-27 MED ORDER — SODIUM CHLORIDE 0.9 % IV BOLUS (SEPSIS)
500.0000 mL | Freq: Once | INTRAVENOUS | Status: AC
  Administered 2016-04-27: 500 mL via INTRAVENOUS

## 2016-04-27 NOTE — Progress Notes (Signed)
[place] PHYSICAL MEDICINE & REHABILITATION     PROGRESS NOTE    Subjective/Complaints:  Pt without pain c/os today Intake variable  ROS limited due to cognition  Objective: Vital Signs: Blood pressure 135/90, pulse 80, temperature 98 F (36.7 C), temperature source Oral, resp. rate 18, height 5' 7"  (1.702 m), weight 74.7 kg (164 lb 9.6 oz), SpO2 100 %. Dg Abd Portable 1v  Result Date: 04/25/2016 CLINICAL DATA:  Feeding tube placement EXAM: PORTABLE ABDOMEN - 1 VIEW COMPARISON:  04/15/2016 FINDINGS: Feeding tube is in place, tip overlying the level of the distal stomach or duodenum bulb. Bowel gas pattern is nonobstructive. IMPRESSION: Feeding tube to distal stomach/ proximal duodenum. Electronically Signed   By: Nolon Nations M.D.   On: 04/25/2016 16:36   No results for input(s): WBC, HGB, HCT, PLT in the last 72 hours. No results for input(s): NA, K, CL, GLUCOSE, BUN, CREATININE, CALCIUM in the last 72 hours.  Invalid input(s): CO CBG (last 3)   Recent Labs  04/26/16 1611 04/26/16 2053 04/27/16 0646  GLUCAP 133* 156* 260*    Wt Readings from Last 3 Encounters:  04/27/16 74.7 kg (164 lb 9.6 oz)  04/05/16 73.3 kg (161 lb 9.6 oz)  01/15/16 75.2 kg (165 lb 12.8 oz)    Physical Exam:  Constitutional: lying comfortably in bed HENT:  Head: Normocephalic and atraumatic.  Right Ear: External ear normal.  Left Ear: External ear normal.  Mouth/Throat: Oropharynx is clear and moist.  Eyes: Conjunctivae and EOM are normal. Pupils are equal, round, and reactive to light. No scleral icterus.  Neck: No JVD present. No tracheal deviation present. No thyromegaly present.  Cardiovascular: Normal rate, regular rhythm and intact distal pulses.  Exam reveals no friction rub.   No murmur heard. Respiratory: Effort normal. No respiratory distress. She has no wheezes. She has no rales. She exhibits no tenderness.  GI: She exhibits no distension. There is no tenderness. There is no  rebound.  Musculoskeletal: She exhibits no edema. Tenderness:.No pain with RUE ROM, Neurological: She is anxious/confused. abnormal muscle tone (MAS  biceps 2/4, wrist flexors 1/4. Pain with attempted finger ext and wrist ext and elbow ext Coordination normal.  RUE and RLE 4 to 4+/5. LUE underlying 1/4 deltoid, biceps, triceps, wrist and finger flexors.  LLE 1+ HF, KE and 0/5 ADF/PF.--no change   Skin: small sup aspect of gluteal cleft tear and surrounding erythema  Psychiatric: She has a normal mood and affect.  Finger flexors are relaxed today, pain with elbow ROM, tone related  Assessment/Plan: 1. 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 Physiatrist is providing close team supervision and 24 hour management of active medical problems listed below. Physiatrist and rehab team continue to assess barriers to discharge/monitor patient progress toward functional and medical goals.  Function:  Bathing Bathing position   Position: Shower  Bathing parts Body parts bathed by patient: Left arm, Chest, Abdomen, Right upper leg, Left upper leg, Right lower leg, Front perineal area Body parts bathed by helper: Buttocks, Left lower leg, Right arm  Bathing assist Assist Level:  (mod A)      Upper Body Dressing/Undressing Upper body dressing   What is the patient wearing?: Pull over shirt/dress     Pull over shirt/dress - Perfomed by patient: Put head through opening, Pull shirt over trunk Pull over shirt/dress - Perfomed by helper: Thread/unthread left sleeve,  Thread/unthread right sleeve        Upper body assist Assist Level:  (mod A)      Lower Body Dressing/Undressing Lower body dressing Lower body dressing/undressing activity did not occur: Refused What is the patient wearing?: Pants, Shoes, AFO Underwear - Performed by patient:  Thread/unthread right underwear leg Underwear - Performed by helper: Thread/unthread left underwear leg, Pull underwear up/down Pants- Performed by patient: Thread/unthread right pants leg Pants- Performed by helper: Thread/unthread left pants leg, Pull pants up/down, Fasten/unfasten pants Non-skid slipper socks- Performed by patient: Don/doff right sock Non-skid slipper socks- Performed by helper: Don/doff left sock   Socks - Performed by helper: Don/doff right sock, Don/doff left sock Shoes - Performed by patient: Don/doff right shoe Shoes - Performed by helper: Don/doff right shoe, Don/doff left shoe, Fasten right, Fasten left (Right shoe donned due to time )   AFO - Performed by helper: Don/doff right AFO   TED Hose - Performed by helper: Don/doff right TED hose, Don/doff left TED hose  Lower body assist Assist for lower body dressing:  (Max A)      Toileting Toileting Toileting activity did not occur: No continent bowel/bladder event Toileting steps completed by patient: Performs perineal hygiene Toileting steps completed by helper: Adjust clothing after toileting, Adjust clothing prior to toileting Toileting Assistive Devices: Grab bar or rail  Toileting assist Assist level: Touching or steadying assistance (Pt.75%)   Transfers Chair/bed transfer   Chair/bed transfer method: Stand pivot Chair/bed transfer assist level: Moderate assist (Pt 50 - 74%/lift or lower) Chair/bed transfer assistive device: Cane, Air cabin crew     Max distance: 15 Assist level: Moderate assist (Pt 50 - 74%)   Wheelchair   Type: Manual Max wheelchair distance: 150 Assist Level: Supervision or verbal cues  Cognition Comprehension Comprehension assist level: Understands basic 75 - 89% of the time/ requires cueing 10 - 24% of the time  Expression Expression assist level: Expresses basic 75 - 89% of the time/requires cueing 10 - 24% of the time. Needs helper to occlude  trach/needs to repeat words.  Social Interaction Social Interaction assist level: Interacts appropriately 50 - 74% of the time - May be physically or verbally inappropriate.  Problem Solving Problem solving assist level: Solves basic 25 - 49% of the time - needs direction more than half the time to initiate, plan or complete simple activities  Memory Memory assist level: Recognizes or recalls 25 - 49% of the time/requires cueing 50 - 75% of the time   Medical Problem List and Plan: 1.  Left spastic hemiplegia/dysarthria secondary to right pontine infarct with recent extension             -continue CIR PT, OT, SLP,Team conference today please see physician documentation under team conference tab, met with team face-to-face to discuss problems,progress, and goals. Formulized individual treatment plan based on medical history, underlying problem and comorbidities.               2.  DVT Prophylaxis/Anticoagulation: Lovenox. Monitor for any bleeding episode  3. Pain Management with recent fall and persistent rib cage pain. Also has had prominent spasms/cramping at night  -Lidoderm patch increase to 3 patches Baclofen15 mg 3 times a day helping with tone, Robaxin as needed.  -off Lyrica 4. Mood: Zoloft 50 mg daily, 5. Neuropsych: This patient is not capable of making decisions on her own behalf.   -HS confusion/altered sleep/wake cycle which has carried over from acute to  admission here.  -increase melatonin to 49m QHS (home medication)  -keep sleep chart  -need to re-establish sleep wake cycle  -all labs reviewed today  -ua/ucx negative 6. Skin/Wound Care: Routine skin checks,MASD improved, skin tear healed 7. 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 Hemoglobin A1c 6.7.  Check blood sugars before meals and  at bedtime  -sugars low given sparce po intake  -increase levemir   36U CBGs improving, expect CBG to decrease with d/c TF -Start Glucophage 1000 mg BID 11/6 CBG (last 3)   Recent Labs  04/26/16 1611 04/26/16 2053 04/27/16 0646  GLUCAP 133* 156* 260*    9.Hypertension with bouts of  orthostasis. Presently on Norvasc 5 mg daily. Patient also on lisinopril 40 mg daily as well as Accuretic prior to admission. Resume as needed 10 Hyperlipidemia .Zocor 40 mg daily 12. Spastic left hemiparesis, acute on chronic             -baclofen just resumed , Elbow flexor spasticity better, still has pain with PROM FFs             -consider botox injections, discussed with husband who is comfortable with that             -continue splinting/ROM-s/p Botox to Elbow and wrist flexors 11/10 13. Poor po intake/altered mentation  -NGT placed- TF at noc ,Trial Megace, cal ct per dietary, improving, will D/C Feeding tube today   14.Basilar stenosis Plavix per neuro     LOS (Days) 19 A FACE TO FACE EVALUATION WAS PERFORMED  KCharlett Blake11/15/2017 7:37 AM

## 2016-04-27 NOTE — Progress Notes (Signed)
Social Work Patient ID: Shelly Mcdaniel, female   DOB: 15-Jun-1955, 60 y.o.   MRN: [id number]  Met with pt and husband who was here briefly due to being sick regarding team conference update. Pt is progressing toward her goals and MD reports he is treating her for a UTI. He is having RN pull her NG tube and will monitor her po intake. Pt's wound looks better and MD feels she will be medically ready for discharge on Friday. Informed husband will need to do more family education with him tomorrow and work on discharge Friday. Both have decided to have Home Health therapies first with [facility] and then transition to OP once routine established. Husband does report he is feeling better today than yesterday.

## 2016-04-27 NOTE — Patient Care Conference (Signed)
Inpatient RehabilitationTeam Conference and Plan of Care Update Date: 04/27/2016   Time: 10:45 AM    Patient Name: Shelly Mcdaniel      Medical Record Number: [id number]  Date of Birth: 11-29-1955 Sex: Female         Room/Bed: 4W15C/4W15C-01 Payor Info: Payor: BLUE CROSS BLUE [employer]IELD / [facility] [facility] / Product Type: *No Product type* /    Admitting Diagnosis: CVA  Admit Date/Time:  04/08/2016  5:54 PM Admission Comments: No comment available   Primary Diagnosis:  Spastic hemiparesis of left nondominant side (HCC) Principal Problem: Spastic hemiparesis of left nondominant side [facility](HCC)  Patient Active Problem List   Diagnosis Date Noted  . Orthostatic hypotension 04/08/2016  . Muscle cramps 04/08/2016  . 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 Cerebral thrombosis with cerebral infarction 04/06/2016  . Basilar artery stenosis   . Dysarthria 04/05/2016  . History of CVA with residual deficit 04/05/2016  . Sinus tachycardia 04/05/2016  . Acute encephalopathy 04/05/2016  . Chronic pain 04/05/2016  . Hyperlipidemia 04/05/2016  . Ischemic stroke (HCC)   . 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 TIA (transient ischemic attack) 11/07/2014  . Uncontrolled hypertension 11/07/2014  . DM II (diabetes mellitus, type II), controlled (HCC) 11/07/2014    Expected Discharge Date: Expected Discharge Date: 04/29/16  Team Members Present: Physician leading conference: Dr. Claudette LawsAndrew Kirsteins Social Worker Present: Dossie DerBecky Winthrop Shannahan, LCSW Nurse Present: Carmie EndAngie Joyce, RN PT Present: Wanda Plumparoline Cook, PT;Rodney Leo GrosserWishart, Antonietta JewelPT;Austin Tucker, PT OT Present: Callie FieldingKatie Pittman, OT SLP Present: Jackalyn LombardNicole Page, SLP PPS Coordinator present : Tora DuckMarie Noel, RN, CRRN     Current Status/Progress Goal Weekly Team Focus  Medical   loose stools improved, skin maceration improved, po intake improved  home with family assist  family training    Bowel/Bladder   Incontinent of bowel and bladder; LBM 11/14  Max assist  Assess and treat for constipation/diarrhea   Swallow/Nutrition/ Hydration   continues to fluctuate, max assist due to cognition   min assist  continue to address basic cognition to facilitate swallowing safety    ADL's   Mod A overall, min - mod A for functional transfers,   min A overall (transfers, bathign and dressing ), mod A for functional use of left UE  attention, L side attention, functional use of L UE, NMR, pt/family training, self care retraining   Mobility   Min-mod assist with bed mobility and transfer. mod assist with gait with RW or hemi walker. patient continues to require constant cues for attention, but has noted improvement in intiation.    min assist with transfers. mod assist gait. [facility] with hemi technique.   improved independence with mobility, increased attention, improve awareness.    Communication             Safety/Cognition/ Behavioral Observations  max assist, slow progress  mod assist  continue to address basic cognition    Pain   C/o pain in buttocks and in left side; pain meds prn   < 3  Assess and treat for pain q shift and prn   Skin   MASD to perineum  mod assist  Assess skin q shift and prn      *See Care Plan and progress notes for long and short-term goals.  Barriers to Discharge: husband not very helpful in providing appropriate assistance    Possible Resolutions to Barriers:  Sister in law to be trained,  cont methylphenidate, treat UTI    Discharge Planning/Teaching Needs:  HUsband is learning her care this week, concerned about NG tube and what MD plans to do. Alos concerned about the wound on her bottom.      Team Discussion:  Progressing toward her goals-UTI now being treated for. neuro-psych tried to see too lethargic this am. DC NG tube today and will monitoring her po intake. Wound looks much better on sacrum. Husband needs more family education  prior to discharge. MD feels medically stable for DC Friday.  Revisions to Treatment Plan:  DC Friday   Continued Need for Acute Rehabilitation Level of Care: The patient requires daily medical management by a physician with specialized training in physical medicine and rehabilitation for the following conditions: Daily analysis of laboratory values and/or radiology reports with any subsequent need for medication adjustment of medical intervention for : Neurological problems  Zenon Leaf, Lemar LivingsRebecca G 04/28/2016, 8:26 AM

## 2016-04-27 NOTE — Plan of Care (Signed)
Problem: RH Balance Goal: LTG Patient will maintain dynamic standing with ADLs (OT) LTG:  Patient will maintain dynamic standing balance with assist during activities of daily living (OT)   Downgraded secondary to slow progress  Problem: RH Bathing Goal: LTG Patient will bathe with assist, cues/equipment (OT) LTG: Patient will bathe specified number of body parts with assist with/without cues using equipment (position)  (OT)  Downgraded secondary to slow progress  Problem: RH Dressing Goal: LTG Patient will perform lower body dressing w/assist (OT) LTG: Patient will perform lower body dressing with assist, with/without cues in positioning using equipment (OT)  Downgraded secondary to slow progress  Problem: RH Toileting Goal: LTG Patient will perform toileting w/assist, cues/equip (OT) LTG: Patient will perform toiletiing (clothes management/hygiene) with assist, with/without cues using equipment (OT)  Downgraded secondary to slow progress  Problem: RH Toilet Transfers Goal: LTG Patient will perform toilet transfers w/assist (OT) LTG: Patient will perform toilet transfers with assist, with/without cues using equipment (OT)  Downgraded secondary to slow progress  Problem: RH Tub/Shower Transfers Goal: LTG Patient will perform tub/shower transfers w/assist (OT) LTG: Patient will perform tub/shower transfers with assist, with/without cues using equipment (OT)  Downgraded secondary to slow progress

## 2016-04-27 NOTE — Progress Notes (Signed)
Occupational Therapy Session Note  Patient Details  Name: Shelly SimmerJody D Mcdaniel MRN: [id number] Date of Birth: 12/21/1955  Today's Date: 04/27/2016 OT Individual Time: 0830-0900 OT Individual Time Calculation (min): 30 min     Short Term Goals: Week 3:  OT Short Term Goal 1 (Week 3): STGs=LTGs secondary to pt's upcoming discharge  Skilled Therapeutic Interventions/Progress Updates:    Treatment session with focus on attention to task, initiation, and functional mobility during self-care task.  Pt received in bed with RN providing medications.  Pt reports need to toilet.  Increased wait time for pt initiation with bed mobility due to decreased sustained attention to task.  Mod multimodal cues to redirect to current task at hand with pt then able to complete bed mobility with min assist.  Sit <> stand with min assist from EOB.  Amb[facility] to [facility] with min-mod assist and use of quad cane, tactile cues and manual facilitation for weight shift as pt tends to lean to Rt.  Completed toileting with pt doffing incontinence brief and completing hygiene post toileting with min assist for standing balance.  Side stepped with quad cane to Rt to sit in recliner with pt requiring increased cues for sequencing with transfer and assist to guide hips into sitting in recliner.  Pt required mod cues throughout session for initiation and attention to task.  Therapy Documentation Precautions:  Precautions Precautions: Fall Precaution Comments: painful left arm with moderate left hemiparesis  Required Braces or Orthoses: Other Brace/Splint Other Brace/Splint: resting hand splint for the left hand at night Restrictions Weight Bearing Restrictions: No General:   Vital Signs:   Pain: Pain Assessment Pain Assessment: No/denies pain Pain Score: 0-No pain  See Function Navigator for Current Functional Status.   Therapy/Group: Individual Therapy  Rosalio LoudHOXIE, Ruben Mahler 04/27/2016, 12:08 PM

## 2016-04-27 NOTE — Progress Notes (Signed)
Nutrition Follow-up  DOCUMENTATION CODES:   Not applicable  INTERVENTION:  Continue Glucerna Shake po TID, each supplement provides 220 kcal and 10 grams of protein.  Provide 30 ml Prostat po TID, each supplement provides 100 kcal and 15 grams of protein.   RD to continue to monitor.   NUTRITION DIAGNOSIS:   Inadequate oral intake related to lethargy/confusion, nausea as evidenced by  (No meal intake x 4 days); progressing  GOAL:   Patient will meet greater than or equal to 90% of their needs; progressing  MONITOR:   PO intake, Supplement acceptance, Labs, Weight trends, TF tolerance, Skin, I & O's  REASON FOR ASSESSMENT:   Consult Assessment of nutrition requirement/status  ASSESSMENT:   60 y/o female PMHx DM, HLD, HTN. She experienced a Stroke in August. She presented yesterday with AMS and dysarthria. MRI showed middle cerebellar peduncle likely representing early subcute ischemia. Admitted for comprehensive rehab.   Plans to remove NGT today and discontinue tube feeds. Plans for discharge Friday. Meal completion has been 50% this AM. RD to order prostat to aid in adequate protein needs as tube feeds will be discontinued. RD to additionally continue Glucerna Shake. Encourage adequate PO intake.   Labs and medications reviewed.   Diet Order:  Diet Carb Modified Fluid consistency: Thin; Room service appropriate? Yes  Skin:  Wound (see comment) (wound on sacrum)  Last BM:  11/14  Height:   Ht Readings from Last 1 Encounters:  04/08/16 5\' 7"  (1.702 m)    Weight:   Wt Readings from Last 1 Encounters:  04/27/16 164 lb 9.6 oz (74.7 kg)    Ideal Body Weight:  61.36 kg  BMI:  Body mass index is 25.78 kg/m.  Estimated Nutritional Needs:   Kcal:  1800-2050  Protein:  75-90 grams  Fluid:  1.8-2.0 L  EDUCATION NEEDS:   No education needs identified at this time  Roslyn SmilingStephanie Zaiya Annunziato, MS, RD, LDN Pager # [phone] After hours/ weekend pager # [phone]

## 2016-04-27 NOTE — Progress Notes (Signed)
Speech Language Pathology Daily Session Note  Patient Details  Name: Shelly Mcdaniel MRN: [id number] Date of Birth: 1955/07/22  Today's Date: 04/27/2016 SLP Individual Time: 1510-1540 SLP Individual Time Calculation (min): 30 min   Short Term Goals: Week 3: SLP Short Term Goal 1 (Week 3): Pt will sustain her attention to basic, familiar tasks for 2-3 minute intervals with mod assist multimodal cues for redirection.   SLP Short Term Goal 2 (Week 3): Pt will intiate and complete basic, familiar tasks with no more than 5 verbal cues for sequencing and organization.  SLP Short Term Goal 3 (Week 3): Pt will utilize external aids to orient to place, date, and situation with min assist verbal cues.   SLP Short Term Goal 4 (Week 3): Pt will utilize call bell to convey needs and wants to caregivers with max assist verbal cues.  SLP Short Term Goal 5 (Week 3): Pt will consume regular textures and thin liquids with min assist for swallow initiation and clearance of oral cavity.  Skilled Therapeutic Interventions:Pt seen for skilled SLP treatment with emphasis on cognition. Pt O x to place and self and stroke.  Min A for time- thought she was discharging home today.  Pt required consistent mod verbal cueing and direction to attend to the task of self-feeding.    Function:  Eating Eating   Modified Consistency Diet: No Eating Assist Level: Supervision or verbal cues;Helper scoops food on utensil;Set up assist for;Help managing cup/glass   Eating Set Up Assist For: Opening containers Helper Scoops Food on Utensil: Occasionally     Cognition Comprehension Comprehension assist level: Understands basic 75 - 89% of the time/ requires cueing 10 - 24% of the time  Expression   Expression assist level: Expresses basic 75 - 89% of the time/requires cueing 10 - 24% of the time. Needs helper to occlude trach/needs to repeat words.  Social Interaction Social Interaction assist level: Interacts appropriately  50 - 74% of the time - May be physically or verbally inappropriate.  Problem Solving Problem solving assist level: Solves basic 25 - 49% of the time - needs direction more than half the time to initiate, plan or complete simple activities  Memory Memory assist level: Recognizes or recalls 25 - 49% of the time/requires cueing 50 - 75% of the time    Pain Pain Assessment Pain Assessment: Faces Faces Pain Scale: Hurts even more Pain Type: Acute pain Pain Location: Buttocks Pain Intervention(s): Repositioned  Therapy/Group: Individual Therapy  Rocky CraftsKara E Adonias Demore MA, CCC-SLP 04/27/2016, 4:41 PM

## 2016-04-27 NOTE — Progress Notes (Addendum)
Physical Therapy Note  Patient Details  Name: Shelly Mcdaniel MRN: [id number] Date of Birth: 1956-03-19 Today's Date: 04/27/2016  1300-1400, 60 min individual tx Pain: none at rest  Pt asleep but easily awakened.  Somnolent during session, dozing off often.  Pt sat EOB with mod cues for balance while RN gave meds.  Sit<>stand for donning shorts with min/mod assist.  PT donned bil TEDS, L AFO, L shoe; pt donned R shoe with assistance for straps.  W/c propulsion using hemi method, with mod cues for route finding therapy gym.  neuromuscular re-education via forced use and multimodal cues for alternating reciprocal movements and L hip and knee extension,  seated on Kinetron at level 60, x 25 cycles x 3.  Gait with RW, L hand splint, LAFO over level tile x 10' x 2 with mod assist, max cues for sequencing, upright trunk and forward gaze.  Pt unsafely wavering while using RE apparently due to somnolence.  RN reported pt has UTI.  Gait using hall way rail on R forward x 20' with min assist, mod cues, and backward x 10' mod/max assist and max cues.  Pt left resting in w/c with quick release belt applied and all needs within reach.  LTG for gait controlled downgraded distance to 25' due to slow progress, and increased w/c distance due to limited gait.  Ki Corbo 04/27/2016, 4:29 PM

## 2016-04-27 NOTE — Plan of Care (Signed)
Problem: RH BLADDER ELIMINATION Goal: RH STG MANAGE BLADDER WITH ASSISTANCE STG Manage Bladder With mod Assistance  Outcome: Not Progressing incontinent     

## 2016-04-27 NOTE — Consult Note (Signed)
NEUROCOGNITIVE STATUS EXAMINATION - CONFIDENTIAL [facility]   Shelly Mcdaniel is a 60 year old woman, who was seen for a brief neurocognitive status examination to evaluate her emotional and cognitive functioning in the setting of right pontine infarct.    Cognitive Functioning:  Shelly Mcdaniel was highly fatigued during the current session; she repeatedly fell back asleep and began snoring mid-sentence or immediately after questions were posed.  She participated to the fullest extent that she was capable in a very brief mental status exam (MMSE-2 brief); her score reflected significant cognitive disruption (10/16), though may not be valid given her extreme fatigue.  Behaviorally, she was observed to be oriented to location and was able to correctly state the reason for her hospitalization and her discharge date.  Subjectively, she denied noticing cognitive changes.    Emotional Functioning:  Shelly Mcdaniel denied symptoms of depression or anxiety, including suicidal ideation.  However, owing to her extreme fatigue, she struggled to answer certain questions.  She reported that she feels ready to discharge in a couple of days and feels as though she has significant support from family and friends.  At one point, she stated that she felt as though she had an extra set of "pressures" on her at the current time, but when asked to explain this further, she fell asleep and then when prompted, stated that she did not feel any pressure.  Shelly Mcdaniel denied concerns about sleep or appetite and overall, stated that she feels as though her hospitalization is going well; she noted improved balance as a symptom of a successful hospital stay.    IMPRESSION:  Shelly Mcdaniel cognitive functioning appeared impaired today, though given her level of fatigue, is likely better than what was observed during today's session.  Cognitive functioning should be monitored at home and it may be useful in 3-6 months  for her to undergo a comprehensive neuropsychological evaluation to assess for potential cognitive effects of stroke.  Contact information for a provider in her area should be included with her discharge paperwork for that purpose.  From an emotional standpoint, Shelly Mcdaniel denied symptoms of mood disruption, but again, given level of fatigue, that assessment may be inaccurate.  If staff notices more pervasive symptoms of mood disruption, a follow-up session with the neuropsychologist could be requested.    DIAGNOSIS:   Right pontine infarct, sequelae  Leavy CellaKaren Khadijatou Borak, Psy.D.  Clinical Neuropsychologist

## 2016-04-27 NOTE — Progress Notes (Signed)
Occupational Therapy Session Note  Patient Details  Name: Shelly SimmerJody D Blessinger MRN: [id number] Date of Birth: 1956-02-26  Today's Date: 04/27/2016 OT Individual Time: 1100-1147 OT Individual Time Calculation (min): 47 min  and Today's Date: 04/27/2016 OT Missed Time: 13 Minutes Missed Time Reason: Patient fatigue     Short Term Goals: Week 3:  OT Short Term Goal 1 (Week 3): STGs=LTGs secondary to pt's upcoming discharge  Skilled Therapeutic Interventions/Progress Updates:    Upon entering the room, pt seated in recliner chair awaiting therapist. Pt very fatigued this session but agreeable to OT intervention. Pt performed sit <>stand with min A . Stand pivot transfer with mod A and use of quad cane. Pt propelled wheelchair with hemiplegic technique with supervision and verbal cues for techniques and to attend to L. Stand pivot transfers to bed <>wheelchair with mod A needed for lifting. Pt performing supine <>sit with steady assistance and mutlimodal cues for technique. Pt returning to wheelchair and propelling back to room. Pt continues to have difficulty remaining awake throughout session and needing increased cues to attend to task as she closes eyes. Pt transferred back into bed and positioned in bed to protect hemiplegic side. Bed alarm activated. Call bell and all needed items within reach upon exiting the room.    Therapy Documentation Precautions:  Precautions Precautions: Fall Precaution Comments: painful left arm with moderate left hemiparesis  Required Braces or Orthoses: Other Brace/Splint Other Brace/Splint: resting hand splint for the left hand at night Restrictions Weight Bearing Restrictions: No General: General OT Amount of Missed Time: 13 Minutes Vital Signs:   Pain: Pain Assessment Pain Assessment: No/denies pain Pain Score: 0-No pain  See Function Navigator for Current Functional Status.   Therapy/Group: Individual Therapy  Alen BleacherBradsher, Alania Overholt P 04/27/2016, 12:34  PM

## 2016-04-27 NOTE — Progress Notes (Signed)
During family visit today with son, son checked Shelly Mcdaniel BP and notified Shelly Mcdaniel daughter who is and MD.  RN rechecked BP manually and received similar results 108/76 sitting.  [place], PT reported Shelly Mcdaniel BP dropped to 100/56 during session.  Daughter called RN concerned about Shelly Mcdaniel BP being too low to perfuse her brain and cause another stroke and requested we spoke with our team in making some arrangements to adjust medications to prevent hypotension.  Shelly Mcdaniel goal SBP is 130-150 per Dr. Warren DanesXu's note.  Notified Marissa NestlePam Love, PA of family concern and adjustments made to regimen including a NS bolus.  Pam recommended holding the dose of baclofen tonight and any BP meds unless the SBP is greater than 150.  Will pass along to next shift RN as well.  Dani Gobbleeardon, Leeann Bady J, RN

## 2016-04-28 ENCOUNTER — Inpatient Hospital Stay (HOSPITAL_COMMUNITY): Payer: BC Managed Care – PPO | Admitting: Physical Therapy

## 2016-04-28 ENCOUNTER — Inpatient Hospital Stay (HOSPITAL_COMMUNITY): Payer: BC Managed Care – PPO

## 2016-04-28 ENCOUNTER — Encounter: Payer: BC Managed Care – PPO | Admitting: *Deleted

## 2016-04-28 ENCOUNTER — Encounter: Payer: BC Managed Care – PPO | Admitting: Occupational Therapy

## 2016-04-28 ENCOUNTER — Inpatient Hospital Stay (HOSPITAL_COMMUNITY): Payer: BC Managed Care – PPO | Admitting: Occupational Therapy

## 2016-04-28 ENCOUNTER — Inpatient Hospital Stay (HOSPITAL_COMMUNITY): Payer: BC Managed Care – PPO | Admitting: Speech Pathology

## 2016-04-28 ENCOUNTER — Ambulatory Visit: Payer: BC Managed Care – PPO | Admitting: Rehabilitation

## 2016-04-28 LAB — BASIC METABOLIC PANEL
ANION GAP: 8 (ref 5–15)
BUN: 19 mg/dL (ref 6–20)
CHLORIDE: 106 mmol/L (ref 101–111)
CO2: 23 mmol/L (ref 22–32)
Calcium: 9.8 mg/dL (ref 8.9–10.3)
Creatinine, Ser: 0.56 mg/dL (ref 0.44–1.00)
Glucose, Bld: 101 mg/dL — ABNORMAL HIGH (ref 65–99)
POTASSIUM: 4.3 mmol/L (ref 3.5–5.1)
SODIUM: 137 mmol/L (ref 135–145)

## 2016-04-28 LAB — GLUCOSE, CAPILLARY
GLUCOSE-CAPILLARY: 93 mg/dL (ref 65–99)
Glucose-Capillary: 213 mg/dL — ABNORMAL HIGH (ref 65–99)
Glucose-Capillary: 184 mg/dL — ABNORMAL HIGH (ref 65–99)
Glucose-Capillary: 179 mg/dL — ABNORMAL HIGH (ref 65–99)

## 2016-04-28 MED ORDER — POTASSIUM CHLORIDE CRYS ER 10 MEQ PO TBCR
10.0000 meq | EXTENDED_RELEASE_TABLET | Freq: Two times a day (BID) | ORAL | Status: DC
  Administered 2016-04-28 – 2016-05-01 (×6): 10 meq via ORAL
  Filled 2016-04-28 (×6): qty 1

## 2016-04-28 MED ORDER — ASPIRIN 325 MG PO TABS
325.0000 mg | ORAL_TABLET | Freq: Every day | ORAL | Status: DC
  Administered 2016-04-29 – 2016-05-01 (×3): 325 mg via ORAL
  Filled 2016-04-28 (×3): qty 1

## 2016-04-28 MED ORDER — INSULIN DETEMIR 100 UNIT/ML ~~LOC~~ SOLN
25.0000 [IU] | Freq: Every day | SUBCUTANEOUS | Status: DC
  Administered 2016-04-28 – 2016-04-30 (×3): 25 [IU] via SUBCUTANEOUS
  Filled 2016-04-28 (×4): qty 0.25

## 2016-04-28 NOTE — Progress Notes (Signed)
Physical Therapy Note  Patient Details  Name: Shelly Mcdaniel MRN: [id number] Date of Birth: [birth year] Today's Date: 04/28/2016  0800-0900, 60 min individual tx Pain: low back, unrated; position change to get out of bed decreased it  Family ed with husband for basic transfers, HEP of L hamstring stretching in sitting using stool and hand out.. Husband was safe with bed mobility and basic transfers but needs more practice to optimize pt's participation. Marland Kitchen.  He observed gait with RW, LAFO. During gait, pt demonstrated improved trunk/LLE stability today vs session yesterday.  L hand splint, and w/c propulsion using hemi method with cues for L attention and posture to optimize use of R foot.  Husband reported that pt has w/c and cushion from previous CVA, but cushion is not very comfortable.  PT urged him to consult vendor about cushion. Pt navigated w/c in crowded room with 1 cue to position w/c next to bed.  Pt left resting in w/c with quick release belt applied and all needs within reach; husband present.   Dr. Wynn BankerKirsteins has extended stay; D/C now planned for 11/19.   See function navigator for current status   Drucella Karbowski 04/28/2016, 7:54 AM

## 2016-04-28 NOTE — Progress Notes (Signed)
Occupational Therapy Session Note  Patient Details  Name: Shelly Mcdaniel MRN: [id number] Date of Birth: [birth year]  Today's Date: 04/28/2016 OT Individual Time: 1103-1204 OT Individual Time Calculation (min): 61 min     Short Term Goals: Week 3:  OT Short Term Goal 1 (Week 3): STGs=LTGs secondary to pt's upcoming discharge  Skilled Therapeutic Interventions/Progress Updates:    Pt's spouse present for family education with selfcare tasks.  He was able to assist pt [facility] and assist with managing clothing.  He also assist [facility] for transition to the shower.  Therapist providing assist for shower transfers at mod assist level stand pivot.  Pt completed bathing sitting on the shower seat with overall max instructional cueing for sequencing and mod assist.  Spouse observed techniques for hand over hand assist with the LUE to wash the right arm and upper left leg.  Discussed cueing pt to do as much as possible and not just completing it for her.  Also discussed possible need for shower mit to place on the left hand to assist with washing as well.  Pt still with constant movements in sitting and decreased sustained attention to tasks overall.  Worked on dressing sit to stand at the sink.  Max demonstrational cueing with max assist for donning pullover shirt following hemi techniques.  She continues to try and dress the right side first with no carryover of need to dress the weaker side.  Max assist for donning LB clothing as well.  Pt placed back in bed at end of session per spouse request.  Positioned her on the right side with wedge and pillows underneath knees and LUE for support.   Therapy Documentation Precautions:  Precautions Precautions: Fall Precaution Comments: painful left arm with moderate left hemiparesis  Required Braces or Orthoses: Other Brace/Splint Other Brace/Splint: resting hand splint for the left hand  at night Restrictions Weight Bearing Restrictions: No   Pain: Pain Assessment Pain Assessment: 0-10 Faces Pain Scale: Hurts little more Pain Type: Acute pain Pain Location: Arm Pain Orientation: Left Pain Descriptors / Indicators: Guarding;Grimacing Pain Intervention(s): Repositioned ADL:  See Function Navigator for Current Functional Status.   Therapy/Group: Individual Therapy  Deylan Canterbury OTR/L 04/28/2016, 12:54 PM

## 2016-04-28 NOTE — Progress Notes (Addendum)
Social Work Patient ID: Shelly Mcdaniel, female   DOB: 1955-08-09, 60 y.o.   MRN: [id number]  MD feels pt missed therapy with her UTI and therapy team feels she would benefit form a couple more days of therapy. Both have decided  Discharge date now is Sunday. Husband is aware and reports he will have family available to assist then and for the whole week of Thanksgiving. Will let [facility] know regarding home health follow up and a visit on Monday. Husband here all day for education.

## 2016-04-28 NOTE — Progress Notes (Addendum)
Physical Therapy Session Note  Patient Details  Name: Wendie SimmerJody D Magouirk MRN: [id number] Date of Birth: 1956-01-21  Today's Date: 04/27/2016 PT Individual Time: 1600-1700   PT Individual Time Calculation : 60 min    Short Term Goals: Week 3:    STG =LTG   Skilled Therapeutic Interventions/Progress Updates:     BP assess 105/63. [facility]   Family education for stand/squat pivot transfers with son x 4 and with PT x 2. Min assist with PT, mod assist with son. PT provided cues to son and for short, simple commands through transfer to all patient to improve success.   WC mobility with min assist x 1125ft with patient able to sustain attention on WC mobility for up to 45 seconds. Before requiring hand over hand assist to find wheel rim and restart propulsion.   Transfer to Nustep with mod assist from PT. PT attempted to instruct patient in reciprocal movement training on nustep, but patient unable to perform task due to fatigue and decrease attention to task on this day.   Patient returned to room and left sitting in recliner with call bell in reach following stand pivot transfer with min assist.   Therapy Documentation Precautions:  Precautions Precautions: Fall Precaution Comments: painful left arm with moderate left hemiparesis  Required Braces or Orthoses: Other Brace/Splint Other Brace/Splint: resting hand splint for the left hand at night Restrictions Weight Bearing Restrictions: No General:   Vital Signs: Therapy Vitals Temp: 98.7 F (37.1 C) Temp Source: Oral Pulse Rate: 94 Resp: 17 BP: (!) 153/82 Patient Position (if appropriate): Lying Oxygen Therapy SpO2: 97 % O2 Device: Not Delivered   See Function Navigator for Current Functional Status.   Therapy/Group: Individual Therapy  Golden Popustin E Tanayia Wahlquist 04/28/2016, 6:11 AM

## 2016-04-28 NOTE — Progress Notes (Signed)
[facility]     PROGRESS NOTE    Subjective/Complaints: Poor day in therapy PT is behind a day, husband missed family training due to URI  ROS limited due to cognition  Objective: Vital Signs: Blood pressure (!) 153/82, pulse 94, temperature 98.7 F (37.1 C), temperature source Oral, resp. rate 17, height 5\' 7"  (1.702 m), weight 74.2 kg (163 lb 9.3 oz), SpO2 97 %. No results found. No results for input(s): WBC, HGB, HCT, PLT in the last 72 hours. No results for input(s): NA, K, CL, GLUCOSE, BUN, CREATININE, CALCIUM in the last 72 hours.  Invalid input(s): CO CBG (last 3)   Recent Labs  04/27/16 1644 04/27/16 2030 04/28/16 0653  GLUCAP 123* 123* 93    Wt Readings from Last 3 Encounters:  04/28/16 74.2 kg (163 lb 9.3 oz)  04/05/16 73.3 kg (161 lb 9.6 oz)  01/15/16 75.2 kg (165 lb 12.8 oz)    Physical Exam:  Constitutional: lying comfortably in bed HENT:  Head: Normocephalic and atraumatic.  Right Ear: External ear normal.  Left Ear: External ear normal.  Mouth/Throat: Oropharynx is clear and moist.  Eyes: Conjunctivae and EOM are normal. Pupils are equal, round, and reactive to light. No scleral icterus.  Neck: No JVD present. No tracheal deviation present. No thyromegaly present.  Cardiovascular: Normal rate, regular rhythm and intact distal pulses.  Exam reveals no friction rub.   No murmur heard. Respiratory: Effort normal. No respiratory distress. She has no wheezes. She has no rales. She exhibits no tenderness.  GI: She exhibits no distension. There is no tenderness. There is no rebound.  Musculoskeletal: She exhibits no edema. Tenderness:.No pain with RUE ROM, Neurological: She is anxious/confused. abnormal muscle tone (MAS  biceps 2/4, wrist flexors 1/4. Pain with attempted finger ext and wrist ext and elbow ext Coordination normal.  RUE and RLE 4 to 4+/5. LUE underlying 1/4 deltoid, biceps, triceps, wrist and finger flexors.   LLE 1+ HF, KE and 0/5 ADF/PF.--no change   Skin: small sup aspect of gluteal cleft tear and surrounding erythema  Psychiatric: She has a normal mood and affect.  Finger flexors are relaxed today, pain with elbow ROM, tone related  Assessment/Plan: 1. 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 Physiatrist is providing close team supervision and 24 hour management of active medical problems listed below. Physiatrist and rehab team continue to assess barriers to discharge/monitor patient progress toward functional and medical goals.  Function:  Bathing Bathing position   Position: Shower  Bathing parts Body parts bathed by patient: Left arm, Chest, Abdomen, Right upper leg, Left upper leg, Right lower leg, Front perineal area Body parts bathed by helper: Buttocks, Left lower leg, Right arm  Bathing assist Assist Level:  (mod A)      Upper Body Dressing/Undressing Upper body dressing   What is the patient wearing?: Pull over shirt/dress     Pull over shirt/dress - Perfomed by patient: Put head through opening, Pull shirt over trunk Pull over shirt/dress - Perfomed by helper: Thread/unthread left sleeve, Thread/unthread right sleeve        Upper body assist Assist Level:  (mod A)      Lower Body Dressing/Undressing Lower body dressing Lower body dressing/undressing activity did not occur: Refused What is the patient wearing?: Pants, Shoes, AFO Underwear - Performed by patient: Thread/unthread right underwear leg Underwear - Performed  by helper: Thread/unthread left underwear leg, Pull underwear up/down Pants- Performed by patient: Thread/unthread right pants leg Pants- Performed by helper: Thread/unthread left pants leg, Pull pants up/down, Fasten/unfasten pants Non-skid slipper socks- Performed by patient: Don/doff right  sock Non-skid slipper socks- Performed by helper: Don/doff left sock   Socks - Performed by helper: Don/doff right sock, Don/doff left sock Shoes - Performed by patient: Don/doff right shoe Shoes - Performed by helper: Don/doff right shoe, Don/doff left shoe, Fasten right, Fasten left (Right shoe donned due to time )   AFO - Performed by helper: Don/doff right AFO   TED Hose - Performed by helper: Don/doff right TED hose, Don/doff left TED hose  Lower body assist Assist for lower body dressing:  (Max A)      Toileting Toileting Toileting activity did not occur: No continent bowel/bladder event Toileting steps completed by patient: Adjust clothing prior to toileting, Performs perineal hygiene Toileting steps completed by helper: Adjust clothing after toileting Toileting Assistive Devices: Grab bar or rail  Toileting assist Assist level: Touching or steadying assistance (Pt.75%)   Transfers Chair/bed transfer   Chair/bed transfer method: Stand pivot Chair/bed transfer assist level: Moderate assist (Pt 50 - 74%/lift or lower) Chair/bed transfer assistive device: Cane, Designer, fashion/clothing     Max distance: 20 Assist level: Touching or steadying assistance (Pt > 75%)   Wheelchair   Type: Manual Max wheelchair distance: 150 Assist Level: Supervision or verbal cues  Cognition Comprehension Comprehension assist level: Understands basic 75 - 89% of the time/ requires cueing 10 - 24% of the time  Expression Expression assist level: Expresses basic 75 - 89% of the time/requires cueing 10 - 24% of the time. Needs helper to occlude trach/needs to repeat words.  Social Interaction Social Interaction assist level: Interacts appropriately 50 - 74% of the time - May be physically or verbally inappropriate.  Problem Solving Problem solving assist level: Solves basic 25 - 49% of the time - needs direction more than half the time to initiate, plan or complete simple activities   Memory Memory assist level: Recognizes or recalls 25 - 49% of the time/requires cueing 50 - 75% of the time   Medical Problem List and Plan: 1.  Left spastic hemiplegia/dysarthria secondary to right pontine infarct with recent extension             -continue CIR PT, OT, SLP,  Per PT lost a day due to somnolence           2.  DVT Prophylaxis/Anticoagulation: Lovenox. Monitor for any bleeding episode  3. Pain Management with recent fall and persistent rib cage pain. Also has had prominent spasms/cramping at night  -Lidoderm patch increase to 3 patches Baclofen15 mg 3 times a day helping with tone, Robaxin as needed.  -off Lyrica 4. Mood: Zoloft 50 mg daily, 5. Neuropsych: This patient is not capable of making decisions on her own behalf.   - -  -ua/ucx positive on Keflex 6. Skin/Wound Care: Routine skin checks,MASD improved, skin tear healed 7. 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 Hemoglobin A1c 6.7.  Check blood sugars before meals and at bedtime  -sugars low given sparce po intake  -increase levemir   36U CBGs improving,off TF -Start Glucophage  1000 mg BID 11/6 CBG (last 3)   Recent Labs  04/27/16 1644 04/27/16 2030 04/28/16 0653  GLUCAP 123* 123* 93    9.Hypertension with bouts of  orthostasis. Presently on Norvasc 5 mg daily. Patient also on lisinopril 40 mg daily as well as Accuretic prior to admission. Resume as needed Had a mild dip to SBP 100mmHg asymptomatic will check ortho vitals 10 Hyperlipidemia .Zocor 40 mg daily 12. Spastic left hemiparesis, acute on chronic             -baclofen just resumed , Elbow flexor spasticity better, still has pain with PROM FFs             -consider botox injections, discussed with husband who is comfortable with that             -continue  splinting/ROM-s/p Botox to Elbow and wrist flexors 11/10 13. 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

## 2016-04-28 NOTE — Progress Notes (Signed)
Speech Language Pathology Daily Progress Note  Patient Details  Name: Shelly SimmerJody D Forge MRN: [id number] Date of Birth: 1956-05-31  Today's Date: 04/28/2016 SLP Individual Time: 1418-1500 SLP Individual Time Calculation (min): 42 min    Skilled Therapeutic Interventions:  Pt was seen for skilled ST targeting cognitive and dysphagia goals.  Pt was eating lunch upon therapist's arrival and needed mod assist verbal cues for task initiation and sequencing during meal.  Pt also needed mod assist verbal cues for attention to boluses to clear solids from the oral cavity post swallow as she continues to demonstrate oral holding.  No overt s/s of aspiration.  Pt oriented to place and situation briefly but then began saying that she needed to get up and go check [place]  Pt also attempted to get out of bed at the end of today's therapy session stating that she was going to go home, listing her address.  Pt easily redirected back into bed.  Therapist reviewed and reinforced safety precautions and that pt was going home tomorrow.  Pt aware that Thanksgiving was coming up next week but needed max assist verbal cues for attention to task and though organization to name at least 3 foods that her family eats on Thanksgiving.  Pt was left in bed with bed alarm set and call bell left within reach.  Pt's husband was not present for education or training on this date. Per report pt's stay is being extended until Sunday versus discharge tomorrow given pt's UTI and husband missing education due to illness.  Will continue efforts for family education at next available appointment.        Function:  Eating Eating                 Cognition Comprehension Comprehension assist level: Understands basic 50 - 74% of the time/ requires cueing 25 - 49% of the time  Expression   Expression assist level: Expresses basic 50 - 74% of the time/requires cueing 25 - 49% of the time. Needs to  repeat parts of sentences.  Social Interaction Social Interaction assist level: Interacts appropriately 50 - 74% of the time - May be physically or verbally inappropriate.  Problem Solving Problem solving assist level: Solves basic 25 - 49% of the time - needs direction more than half the time to initiate, plan or complete simple activities  Memory Memory assist level: Recognizes or recalls 25 - 49% of the time/requires cueing 50 - 75% of the time   Jackalyn LombardPage, Viann Nielson L 04/28/2016, 2:55 PM

## 2016-04-28 NOTE — Progress Notes (Signed)
Nutrition Follow-up  DOCUMENTATION CODES:   Not applicable  INTERVENTION:  Continue Glucerna Shake po TID, each supplement provides 220 kcal and 10 grams of protein.  Provide 30 ml Prostat po TID, each supplement provides 100 kcal and 15 grams of protein.   Encourage adequate PO intake.   Recommend continuation of nutritional supplements once discharged home to aid in adequate nutrition needs.   NUTRITION DIAGNOSIS:   Inadequate oral intake related to lethargy/confusion, nausea as evidenced by  (No meal intake x 4 days); diet advanced; progressing  GOAL:   Patient will meet greater than or equal to 90% of their needs; progressing  MONITOR:   PO intake, Supplement acceptance, Labs, Weight trends, TF tolerance, Skin, I & O's  REASON FOR ASSESSMENT:   Consult Assessment of nutrition requirement/status  ASSESSMENT:   60 y/o female PMHx DM, HLD, HTN. She experienced a Stroke in August. She presented yesterday with AMS and dysarthria. MRI showed middle cerebellar peduncle likely representing early subcute ischemia. Admitted for comprehensive rehab.   NGT removed yesterday. Tube feeding has been discontinued. Pt currently has Glucerna Shake and Prostat ordered and has been consuming most of them. RD to continue with current orders. Plans to discharge home Sunday. Spoke with husband. Pt has been consuming foods brought in from home. Recommended continuation of nutritional supplements once discharge home to aid in adequate nutrition needs. Husband expressed understanding.   Labs and medications reviewed.   Diet Order:  Diet Carb Modified Fluid consistency: Thin; Room service appropriate? Yes  Skin:  Wound (see comment) (wound on sacrum)  Last BM:  11/14  Height:   Ht Readings from Last 1 Encounters:  04/08/16 5\' 7"  (1.702 m)    Weight:   Wt Readings from Last 1 Encounters:  04/28/16 163 lb 9.3 oz (74.2 kg)    Ideal Body Weight:  61.36 kg  BMI:  Body mass index is  25.62 kg/m.  Estimated Nutritional Needs:   Kcal:  1800-2050  Protein:  75-90 grams  Fluid:  1.8-2.0 L  EDUCATION NEEDS:   No education needs identified at this time  Roslyn SmilingStephanie Selwyn Reason, MS, RD, LDN Pager # [phone] After hours/ weekend pager # [phone]

## 2016-04-28 NOTE — Progress Notes (Signed)
Physical Therapy Session Note  Patient Details  Name: Shelly Mcdaniel MRN: [id number] Date of Birth: February 06, 1956  Today's Date: 04/28/2016 PT Individual Time: 1000-1030 PT Individual Time Calculation (min): 30 min    Short Term Goals: Week 3:    STG =LTG   Skilled Therapeutic Interventions/Progress Updates:     Patient husband present for family education.  Patient performed supine>sit with min assist from PT to exit on L side of bed and min use of bed rails. Stand pivot transfer to [facility] with min assist from PT with mod cues for proper UE placement and stepping technique.   PT transported patient to ortho gym in [facility] for time management. PT instructed patient in car transfer with use of RW and LUE hand splint. Min assist from PT to prevent posterior LOB and assist with LLE management. PT educated husband on proper use of hand splint and application to reduce pain in hand. Proper guarding techniques also demonstrated by PT to prevent LOB.  Pt unable to preform car transfer second time for husband to demonstrate due to LE fatigue. Will re-attempt family training for car transfer at later date. Patient returned to room and left sitting in [facility] with call bell in reach and all needs met.   Therapy Documentation Precautions:  Precautions Precautions: Fall Precaution Comments: painful left arm with moderate left hemiparesis  Required Braces or Orthoses: Other Brace/Splint Other Brace/Splint: resting hand splint for the left hand at night Restrictions Weight Bearing Restrictions: No General:   Vital Signs: Therapy Vitals Temp: 98.6 F (37 C) Temp Source: Oral Pulse Rate: (!) 101 Resp: 16 BP: 120/73 Patient Position (if appropriate): Lying Oxygen Therapy SpO2: 98 % O2 Device: Not Delivered Pain: Pain Assessment Pain Assessment: No/denies pain   See Function Navigator for Current Functional Status.   Therapy/Group: Individual Therapy  Lorie Phenix 04/28/2016, 6:05 PM

## 2016-04-29 ENCOUNTER — Inpatient Hospital Stay (HOSPITAL_COMMUNITY): Payer: BC Managed Care – PPO | Admitting: Speech Pathology

## 2016-04-29 ENCOUNTER — Inpatient Hospital Stay (HOSPITAL_COMMUNITY): Payer: BC Managed Care – PPO | Admitting: Occupational Therapy

## 2016-04-29 ENCOUNTER — Inpatient Hospital Stay (HOSPITAL_COMMUNITY): Payer: BC Managed Care – PPO | Admitting: Physical Therapy

## 2016-04-29 LAB — URINE CULTURE: Culture: >=100000 — AB

## 2016-04-29 LAB — GLUCOSE, CAPILLARY
Glucose-Capillary: 136 mg/dL — ABNORMAL HIGH (ref 65–99)
Glucose-Capillary: 195 mg/dL — ABNORMAL HIGH (ref 65–99)
Glucose-Capillary: 145 mg/dL — ABNORMAL HIGH (ref 65–99)
Glucose-Capillary: 146 mg/dL — ABNORMAL HIGH (ref 65–99)

## 2016-04-29 MED ORDER — BACLOFEN 10 MG PO TABS: 15.0000 mg | ORAL_TABLET | Freq: Three times a day (TID) | ORAL | 1 refills | Status: AC

## 2016-04-29 MED ORDER — METFORMIN HCL 850 MG PO TABS: 850.0000 mg | ORAL_TABLET | Freq: Two times a day (BID) | ORAL | 1 refills | Status: DC

## 2016-04-29 MED ORDER — ROSUVASTATIN CALCIUM 5 MG PO TABS: 5.0000 mg | ORAL_TABLET | Freq: Every day | ORAL | 1 refills | Status: DC

## 2016-04-29 MED ORDER — LIDOCAINE 5 % EX PTCH: 3.0000 | MEDICATED_PATCH | CUTANEOUS | 0 refills | Status: AC

## 2016-04-29 MED ORDER — SCOPOLAMINE 1 MG/3DAYS TD PT72: 1.0000 | MEDICATED_PATCH | TRANSDERMAL | 12 refills | Status: AC

## 2016-04-29 MED ORDER — AMLODIPINE BESYLATE 5 MG PO TABS: 5.0000 mg | ORAL_TABLET | Freq: Every day | ORAL | 1 refills | Status: DC

## 2016-04-29 MED ORDER — SERTRALINE HCL 50 MG PO TABS: 50.0000 mg | ORAL_TABLET | Freq: Every day | ORAL | 1 refills | Status: DC

## 2016-04-29 MED ORDER — LEVEMIR FLEXTOUCH 100 UNIT/ML ~~LOC~~ SOPN: 25.0000 [IU] | PEN_INJECTOR | Freq: Every evening | SUBCUTANEOUS | 11 refills | Status: AC

## 2016-04-29 MED ORDER — FAMOTIDINE 20 MG PO TABS: 20.0000 mg | ORAL_TABLET | Freq: Two times a day (BID) | ORAL | 1 refills | Status: DC

## 2016-04-29 MED ORDER — MELATONIN 3 MG PO TABS: 6.0000 mg | ORAL_TABLET | Freq: Every day | ORAL | 0 refills | Status: DC

## 2016-04-29 MED ORDER — TRAMADOL HCL 50 MG PO TABS: 50.0000 mg | ORAL_TABLET | Freq: Four times a day (QID) | ORAL | 0 refills | Status: DC | PRN

## 2016-04-29 MED ORDER — CEPHALEXIN 250 MG PO CAPS: 250.0000 mg | ORAL_CAPSULE | Freq: Three times a day (TID) | ORAL | 0 refills | Status: DC

## 2016-04-29 MED ORDER — POTASSIUM CHLORIDE CRYS ER 10 MEQ PO TBCR: 10.0000 meq | EXTENDED_RELEASE_TABLET | Freq: Two times a day (BID) | ORAL | 1 refills | Status: DC

## 2016-04-29 MED ORDER — METHYLPHENIDATE HCL 5 MG PO TABS: 5.0000 mg | ORAL_TABLET | Freq: Two times a day (BID) | ORAL | 0 refills | Status: DC

## 2016-04-29 MED ORDER — CLOPIDOGREL BISULFATE 75 MG PO TABS: 75.0000 mg | ORAL_TABLET | Freq: Every day | ORAL | 0 refills | Status: AC

## 2016-04-29 MED ORDER — LISINOPRIL 10 MG PO TABS: 10.0000 mg | ORAL_TABLET | Freq: Every day | ORAL | 1 refills | Status: DC

## 2016-04-29 NOTE — Progress Notes (Signed)
Speech Language Pathology Daily Session Note  Patient Details  Name: Shelly Mcdaniel MRN: [id number] Date of Birth: [birth year]  Today's Date: 04/29/2016 SLP Individual Time: 1400-1500 SLP Individual Time Calculation (min): 60 min   Short Term Goals: Week 3: SLP Short Term Goal 1 (Week 3): Pt will sustain her attention to basic, familiar tasks for 2-3 minute intervals with mod assist multimodal cues for redirection.   SLP Short Term Goal 2 (Week 3): Pt will intiate and complete basic, familiar tasks with no more than 5 verbal cues for sequencing and organization.  SLP Short Term Goal 3 (Week 3): Pt will utilize external aids to orient to place, date, and situation with min assist verbal cues.   SLP Short Term Goal 4 (Week 3): Pt will utilize call bell to convey needs and wants to caregivers with max assist verbal cues.  SLP Short Term Goal 5 (Week 3): Pt will consume regular textures and thin liquids with min assist for swallow initiation and clearance of oral cavity.  Skilled Therapeutic Interventions:Pt seen for skilled SLP treatment with emphasis on cognition. Pt O x 4 at mod I for increased time. Attention significantly improved- changed to intermittent supervision with meals. Sustained attention to conversation with appropriate topic maintenance for 15 min mod I. Basic reasoning in a functional task completed with min questioning cues.     Function:  Eating Eating                 Cognition Comprehension Comprehension assist level: Understands basic 90% of the time/cues < 10% of the time  Expression   Expression assist level: Expresses basic 90% of the time/requires cueing < 10% of the time.  Social Interaction Social Interaction assist level: Interacts appropriately 90% of the time - Needs monitoring or encouragement for participation or interaction.  Problem Solving Problem solving assist level: Solves basic 25 - 49% of the time - needs direction more than half the time to  initiate, plan or complete simple activities  Memory Memory assist level: Recognizes or recalls 50 - 74% of the time/requires cueing 25 - 49% of the time    Pain Pain Assessment Pain Assessment: No/denies pain Pain Score: 8  Pain Type: Acute pain Pain Location: Arm Pain Orientation: Left Pain Descriptors / Indicators: Aching Pain Onset: Gradual Pain Intervention(s): RN made aware  Therapy/Group: Individual Therapy  Rocky CraftsKara E Nadalyn Deringer MA, CCC-SLP 04/29/2016, 3:55 PM

## 2016-04-29 NOTE — Progress Notes (Signed)
Occupational Therapy Session Note  Patient Details  Name: Shelly SimmerJody D Mcdaniel MRN: [id number] Date of Birth: [birth year]  Today's Date: 04/29/2016 OT Individual Time: 1300-1330 OT Individual Time Calculation (min): 30 min     Short Term Goals: Week 3:  OT Short Term Goal 1 (Week 3): STGs=LTGs secondary to pt's upcoming discharge  Skilled Therapeutic Interventions/Progress Updates:    Pt in bed eating to start session.  Therapist assisted pt to the EOB on the left side with mod assist to begin session.  She worked on donning brief and shorts at [employer] with mod assist.  Pt was given verbal cueing to begin with threading the LLE through the shorts leg first and then proceed with the right, but instead she ignored recommendation and place the RLE in first.  Mod assist needed for threading the left one after this.  Min assist for transfer stand pivot to the wheelchair.  Pt continued self feeding to conclude session with no verbal cueing needed to initiate and complete.  Notified nursing of pt still working on self feeding at end of session.  Pt left in wheelchair with LUE positioned on pillow and safety belt in place.  Call button in reach on the left side.       Therapy Documentation Precautions:  Precautions Precautions: Fall Precaution Comments: painful left arm with moderate left hemiparesis  Required Braces or Orthoses: Other Brace/Splint Other Brace/Splint: resting hand splint for the left hand at night Restrictions Weight Bearing Restrictions: No  Pain: Pain Assessment Pain Assessment: Faces Faces Pain Scale: Hurts little more Pain Type: Chronic pain Pain Location: Shoulder Pain Orientation: Left Pain Intervention(s): Repositioned ADL: See Function Navigator for Current Functional Status.   Therapy/Group: Individual Therapy  Chantay Whitelock OTR/L 04/29/2016, 4:37 PM

## 2016-04-29 NOTE — Progress Notes (Signed)
Physical Therapy Session Note  Patient Details  Name: Shelly Mcdaniel MRN: [id number] Date of Birth: [birth year]  Today's Date: 04/29/2016 PT Individual Time: 0901-0959 PT Individual Time Calculation (min): 58 min    Short Term Goals: Week 2:  PT Short Term Goal 1 (Week 2): Patient will perform sit<>stand transfers with min Assist  PT Short Term Goal 1 - Progress (Week 2): Met PT Short Term Goal 2 (Week 2): patient will perform squat pivot with min assit from PT.  PT Short Term Goal 2 - Progress (Week 2): Partly met (to R) PT Short Term Goal 3 (Week 2): patient will perform gait x 61f with LRAD and mod Assist.  PT Short Term Goal 3 - Progress (Week 2): Partly met PT Short Term Goal 4 (Week 2): Patient will be able sustain attent to task for >5 minutes with only min cues.  PT Short Term Goal 4 - Progress (Week 2): Met PT Short Term Goal 5 (Week 2): Patient will performed WC mobility with supervision Assist x 1530f PT Short Term Goal 5 - Progress (Week 2): Met Week 3:    STG =LTG  Skilled Therapeutic Interventions/Progress Updates:     Patient received supinwe in bed and agreeable to PT.  PT instructed patient in supine>sit through log roll to the L with min assist at trunk. PT assisted patient to don shoes and L AFO with max assist for time management.  [facility]   WC mobility x 16053fith supervision assist. Min cues for door way management. Patient able to demonstrate improved L awareness and sustained attention to prevent hitting any obstacles.   Gait training x [id number] min-mod assist, L hand splint and RW. Min-mod cues from PT for improved step to gait pattern, WB through the LUE, and anterior weight shift to prevent posterior LOB.    PT instructed patient in stair training x 8, 3 inch steps with min assist ascent and mod assist with descent. Min cues for step to gait pattern and slight manual facilitation for trunk rotation to allow  increased hip flexion of the LLE.   Car transfer training without and AD, and with mod assist and min cues for safety.   [facility] returned to [facility].     Therapy Documentation Precautions:  Precautions Precautions: Fall Precaution Comments: painful left arm with moderate left hemiparesis  Required Braces or Orthoses: Other Brace/Splint Other Brace/Splint: resting hand splint for the left hand at night Restrictions Weight Bearing Restrictions: No General:   Vital Signs: Therapy Vitals Temp: 98.1 F (36.7 C) Temp Source: Oral Pulse Rate: 88 Resp: 16 BP: 131/70 Patient Position (if appropriate): Lying Oxygen Therapy SpO2: 98 % O2 Device: Not Delivered   See Function Navigator for Current Functional Status.   Therapy/Group: Individual Therapy  AustiLorie Phenix7/2017, 9:05 AM

## 2016-04-29 NOTE — Progress Notes (Addendum)
Occupational Therapy Discharge Summary  Patient Details  Name: Shelly Mcdaniel MRN: [id number] Date of Birth: 1956/05/10  Today's Date: 11/18 and 19/2017       Patient has met 9 of 14 long term goals due to improved balance, postural control and improved attention.  Patient to discharge at overall Mod Assist level.  Patient's care partner is independent to provide the necessary physical and cognitive assistance at discharge.    Reasons goals not met: Pt continues to demonstrate cognitive deficits with regards to attention, initiation, and awareness.  Max assist is also needed for LUE functional use during selfcare tasks.  Min assist for dynamic sitting balance during bathing and dressing as well.    Recommendation:  Patient will benefit from ongoing skilled OT services in outpatient setting to continue to advance functional skills in the area of BADL and Reduce care partner burden.  Shelly Mcdaniel continues to demonstrate significant deficits in LUE function as well as increased pain.  She continues to need max hand over hand assist to initiate and use in functional tasks.  Min assist is needed for all transfers stand pivot as well as mod assist for bathing and dressing tasks.  Recommend continued OT to address performance of ADLS as pt continues to need max instructional cueing to safely initiate and sequence these tasks as well.    Equipment: No equipment provided  Reasons for discharge: treatment goals met and discharge from hospital  Patient/family agrees with progress made and goals achieved: Yes  OT Discharge Precautions/Restrictions  Precautions Precautions: Fall Precaution Comments: painful left arm with moderate left hemiparesis  Other Brace/Splint: resting hand splint for the left hand at night   Pain Pain Assessment Pain Assessment: Faces Faces Pain Scale: Hurts little more Pain Type: Chronic pain Pain Location: Shoulder Pain Orientation: Left Pain Intervention(s):  Repositioned ADL  See Function Section of chart for details  Vision/Perception  Vision- History Baseline Vision/History: Wears glasses Wears Glasses: At all times Vision- Assessment Vision Assessment?: Yes Ocular Range of Motion: Within Functional Limits Alignment/Gaze Preference: Head turned (slight head turn to the right noted) Tracking/Visual Pursuits: Decreased smoothness of horizontal tracking;Other (comment) (Noted at times with horizontal tracking the right eye overshoots when tracking to the left and left eye when tracking laterally to the right.  Slight jerkiness noted with tracking in all directions as well. ) Visual Fields: No apparent deficits  Cognition Overall Cognitive Status: Impaired/Different from baseline Arousal/Alertness: Awake/alert Orientation Level: Oriented to person;Oriented to place;Oriented to situation Attention: Sustained;Selective Focused Attention: Appears intact Sustained Attention: Impaired Selective Attention: Impaired Memory: Impaired Memory Impairment: Decreased recall of new information Awareness: Impaired Awareness Impairment: Intellectual impairment Problem Solving: Impaired Safety/Judgment: Impaired Comments: Pt still needing max instructional cueing to sequence through bathing including hemi dressing techniques.   Sensation Sensation Light Touch: Appears Intact Stereognosis: Not tested Hot/Cold: Not tested Proprioception: Not tested Additional Comments: Pt still with increased LUE pain in the digits, and shoulder with PROM/AAROM Coordination Gross Motor Movements are Fluid and Coordinated: No Fine Motor Movements are Fluid and Coordinated: No Coordination and Movement Description: Pt with Brunnstrum stage II movement only in the LUE at the shoulder.  Slight shoulder elevation present with synergy pattern developing.  Pt with increased pain in the UE with max hand over hand assist for bathing tasks.   Motor  Motor Motor:  Hemiplegia;Abnormal postural alignment and control Motor - Discharge Observations: Pt still with significant LUE and LLE hemiparesis.   Mobility  Bed Mobility Bed Mobility:  Supine to Sit Supine to Sit: 3: Mod assist;HOB flat Supine to Sit Details: Manual facilitation for weight shifting;Manual facilitation for weight bearing;Verbal cues for sequencing Transfers Transfers: Sit to Stand;Stand to Sit Sit to Stand: With armrests;4: Min assist;With upper extremity assist Stand to Sit: 4: Min assist;With armrests;With upper extremity assist;To toilet  Trunk/Postural Assessment  Cervical Assessment Cervical Assessment: Exceptions to [facility] (cervical flexion with slight rotation to the right) Thoracic Assessment Thoracic Assessment: Exceptions to [facility] (thoracic rounding with left trunk passive elongation with right side shortening) Lumbar Assessment Lumbar Assessment: Exceptions to [facility] (maintains posterior pelvic tilt in sitting) Postural Control Postural Control: Deficits on evaluation  Balance Balance Balance Assessed: Yes Static Sitting Balance Static Sitting - Balance Support: Right upper extremity supported;Feet supported Static Sitting - Level of Assistance: 5: Stand by assistance Dynamic Sitting Balance Dynamic Sitting - Balance Support: Feet supported Dynamic Sitting - Level of Assistance: 4: Min assist Static Standing Balance Static Standing - Balance Support: Right upper extremity supported Static Standing - Level of Assistance: 4: Min assist Dynamic Standing Balance Dynamic Standing - Balance Support: During functional activity Dynamic Standing - Level of Assistance: 3: Mod assist Extremity/Trunk Assessment RUE Assessment RUE Assessment: Within Functional Limits LUE Strength LUE Overall Strength Comments: Pt with increased pain in all joints of the LUE.  hand maintains digit flexion wbut she can tolerate 90% of full digit extension but still with report of pain.  Elbow  flexion/extension limited as well secondary to pain but pt able to tolerate 0-90 degrees approximately.  Shoulder flexion PROM only 0-80 degrees with increased pain at this time.  Increased flexor tone noted in digits and elbow but less than on initial eval, botox injections given by MD.  Inferior subluxation present as well.  Pt has resting hand splint she continues to tolerate.    See Function Navigator for Current Functional Status.  Natelie Ostrosky OTR/L 04/29/2016, 5:12 PM

## 2016-04-29 NOTE — Progress Notes (Signed)
Occupational Therapy Session Note  Patient Details  Name: DEMA TIMMONS MRN: [id number] Date of Birth: 1956/05/19  Today's Date: 04/29/2016 OT Individual Time: 1030-1150 OT Individual Time Calculation (min): 80 min (missed 10 min due to dizziness)    Short Term Goals: Week 1:  OT Short Term Goal 1 (Week 1): Pt will initate and complete UB bathing with min assist and no more than mod instructional cueing.  OT Short Term Goal 1 - Progress (Week 1): Not met OT Short Term Goal 2 (Week 1): Pt will perform LB bathing with mod assist sit to stand.   OT Short Term Goal 2 - Progress (Week 1): Not met OT Short Term Goal 3 (Week 1): Pt will maintain static sitting balance EOB with supervison in preparation for selfcare tasks and functional transfers.   OT Short Term Goal 3 - Progress (Week 1): Not met OT Short Term Goal 4 (Week 1): Pt will complete stand pivot transfer to 3:1 with mod assist 2 consecutive sessions.   OT Short Term Goal 4 - Progress (Week 1): Not met OT Short Term Goal 5 (Week 1): Pt will tolerate 70 degrees AAROM at the left shoulder with pain less than 4/10.  OT Short Term Goal 5 - Progress (Week 1): Met Week 2:  OT Short Term Goal 1 (Week 2): Pt will initate and complete UB bathing with min assist and no more than mod instructional cueing.  OT Short Term Goal 1 - Progress (Week 2): Partly met OT Short Term Goal 2 (Week 2): Pt will perform LB bathing with mod assist sit to stand.   OT Short Term Goal 2 - Progress (Week 2): Met OT Short Term Goal 3 (Week 2): Pt will maintain static sitting balance EOB with supervison in preparation for selfcare tasks and functional transfers.   OT Short Term Goal 3 - Progress (Week 2): Partly met OT Short Term Goal 4 (Week 2): Pt will complete stand pivot transfer to 3:1 with mod assist 2 consecutive sessions.   OT Short Term Goal 4 - Progress (Week 2): Met Week 3:  OT Short Term Goal 1 (Week 3): STGs=LTGs secondary to pt's upcoming  discharge  Skilled Therapeutic Interventions/Progress Updates:    Pt seen this session for continued education with pt's spouse with functional mobility and ADL skills. Pt in bed feeling somewhat dizzy but agreeable to a shower.  Husband asked to initiate all tasks and therapist would intervene or make suggestions as needed.   Pt rolled to R with light touching A on knees only to fully bring them to R, pt was able to actively roll torso fully to R. Cues to spouse and pt to roll slightly forward more so she could gaze at the floor. This allowed her to push up into sitting with RUE with only min A of facilitation at L hip. Spouse did well with sit to stand and stand pivot to [facility].  Pt able to pull her pants down herself. She did have BM, but was constantly writhing in pain from LUE.  To reduce pain, made a make shift arm sling. Pt felt this helped a great deal. Spouse stated he has several slings at home, traditional style not the [employer].  Recommended he use this one only during toilet transfers, toileting, or car transfers and travel.   Pt stood with spouse and cleansed herself.  Pt very dizzy, stating she had to lay down.  Rested for 5 min and then attempted to have  pt sit up again. Blood pressure fine, but she was overwhelmed with dizziness.  Adjusted in bed (left pants off, applied cream to sore buttocks, applied small Aleyvn patch).  Worked on positioning in side lying.   Pt stated she could not continue participating. Pt in room with spouse and all needs met.   Therapy Documentation Precautions:  Precautions Precautions: Fall Precaution Comments: painful left arm with moderate left hemiparesis  Required Braces or Orthoses: Other Brace/Splint Other Brace/Splint: resting hand splint for the left hand at night Restrictions Weight Bearing Restrictions: No General: General OT Amount of Missed Time: 10 Minutes Pain: Pain Assessment Pain Assessment: 0-10 Pain Score: 8  Pain Type: Acute  pain Pain Location: Arm Pain Orientation: Left Pain Descriptors / Indicators: Aching Pain Onset: Gradual Pain Intervention(s): RN made aware ADL:    See Function Navigator for Current Functional Status.   Therapy/Group: Individual Therapy  SAGUIER,JULIA 04/29/2016, 12:34 PM

## 2016-04-29 NOTE — Progress Notes (Signed)
Social Work  Discharge Note  The overall goal for the admission was met for:   Discharge location: Yes-HOME WITH HUSBAND WHO WILL PROVIDE 24 HR CARE  Length of Stay: Yes-23 DAYS  Discharge activity level: Yes-MIN ASSIST LEVEL  Home/community participation: Yes  Services provided included: MD, RD, PT, OT, SLP, RN, CM, TR, Pharmacy, Neuropsych and SW  Financial Services: Private Insurance: [place] BC[employer]  Follow-up services arranged: Home Health: [facility] and Patient/Family request agency HH: PREF USED BEFORE, DME: NO NEEDS HAD FROM PREVIOUS ADMITS  Comments (or additional information):HUSBAND WAS HERE FOR FAMILY EDUCATION-COMPLETED AND FEELS READY TO TAKE PT HOME. EXTENDED TWO MORE DAYS DUE TO MISSED THERAPIES WITH UTI AND HUSBAND WAS ILL ALSO. WILL PLAN TO TRANSITION TO [facility] APPOINTMENT 12/12 @ 9:15-11:00 AND 12/27 @ 2:00 PM. BOTH AWARE OF THIS. HUSBAND AWARE PT CAN NOT BE LEFT ALONE SHE REQUIRES 24 HR CARE.  Patient/Family verbalized understanding of follow-up arrangements: Yes  Individual responsible for coordination of the follow-up plan: PATIENT & DANIEL-HUSBAND  Confirmed correct DME delivered: Elease Hashimoto 04/29/2016    Elease Hashimoto

## 2016-04-29 NOTE — Plan of Care (Signed)
Problem: RH Balance Goal: LTG: Patient will maintain dynamic sitting balance (OT) LTG:  Patient will maintain dynamic sitting balance with assistance during activities of daily living (OT)  Outcome: Not Met (add Reason) Still needs min assist level  Problem: RH Functional Use of Upper Extremity Goal: LTG Patient will use RT/LT upper extremity as a (OT) LTG: Patient will use right/left upper extremity as a stabilizer/gross assist/diminished/nondominant/dominant level with assist, with/without cues during functional activity (OT)  Outcome: Not Met (add Reason) Max assist for hand over hand assistance.   Problem: RH Attention Goal: LTG Patient will demonstrate focused/sustained (OT) LTG:  Patient will demonstrate focused/sustained/selective/alternating/divided attention during functional activities in specific environment with assist for # of minutes  (OT)  Outcome: Not Met (add Reason) Currently only able to maintain sustained attention with mod assist.   Problem: RH Awareness Goal: LTG: Patient will demonstrate intellectual/emergent (OT) LTG: Patient will demonstrate intellectual/emergent/anticipatory awareness with assist during a functional activity  (OT)  Outcome: Not Met (add Reason) Needs max assist for emergent awareness.

## 2016-04-29 NOTE — Discharge Instructions (Signed)
Inpatient Rehab Discharge Instructions  Shelly SimmerJody D Mcdaniel Discharge date and time: No discharge date for patient encounter.   Activities/Precautions/ Functional Status: Activity: activity as tolerated Diet: diabetic diet Wound Care: keep wound clean and dry Functional status:  ___ No restrictions     ___ Walk up steps independently ___ 24/7 supervision/assistance   ___ Walk up steps with assistance ___ Intermittent supervision/assistance  ___ Bathe/dress independently ___ Walk with walker     _x STROKE/TIA DISCHARGE INSTRUCTIONS SMOKING Cigarette smoking nearly doubles your risk of having a stroke & is the single most alterable risk factor  If you smoke or have smoked in the last 12 months, you are advised to quit smoking for your health.  Most of the excess cardiovascular risk related to smoking disappears within a year of stopping.  Ask you doctor about anti-smoking medications  [place] Quit Line: 1-800-QUIT NOW  Free Smoking Cessation Classes [phone]  CHOLESTEROL Know your levels; limit fat & cholesterol in your diet  Lipid Panel     Component Value Date/Time   CHOL 164 04/07/2016 0317   TRIG 125 04/07/2016 0317   HDL 29 (L) 04/07/2016 0317   CHOLHDL 5.7 04/07/2016 0317   VLDL 25 04/07/2016 0317   LDLCALC 110 (H) 04/07/2016 0317      Many patients benefit from treatment even if their cholesterol is at goal.  Goal: Total Cholesterol (CHOL) less than 160  Goal:  Triglycerides (TRIG) less than 150  Goal:  HDL greater than 40  Goal:  LDL (LDLCALC) less than 100   BLOOD PRESSURE American Stroke Association blood pressure target is less that 120/80 mm/Hg  Your discharge blood pressure is:  BP: (!) 145/64  Monitor your blood pressure  Limit your salt and alcohol intake  Many individuals will require more than one medication for high blood pressure  DIABETES (A1c is a blood sugar average for last 3 months) Goal HGBA1c is under 7% (HBGA1c is blood sugar average for last 3  months)  Diabetes:   Lab Results  Component Value Date   HGBA1C 6.7 (H) 04/05/2016     Your HGBA1c can be lowered with medications, healthy diet, and exercise.  Check your blood sugar as directed by your physician  Call your physician if you experience unexplained or low blood sugars.  PHYSICAL ACTIVITY/REHABILITATION Goal is 30 minutes at least 4 days per week  Activity: Increase activity slowly, Therapies: Physical Therapy: Home Health Return to work:   Activity decreases your risk of heart attack and stroke and makes your heart stronger.  It helps control your weight and blood pressure; helps you relax and can improve your mood.  Participate in a regular exercise program.  Talk with your doctor about the best form of exercise for you (dancing, walking, swimming, cycling).  DIET/WEIGHT Goal is to maintain a healthy weight  Your discharge diet is: Diet Carb Modified Fluid consistency: Thin; Room service appropriate? Yes  liquids Your height is:  Height: 5\' 7"  (170.2 cm) Your current weight is: Weight: 75.6 kg (166 lb 10.7 oz) Your Body Mass Index (BMI) is:  BMI (Calculated): 26.4  Following the type of diet specifically designed for you will help prevent another stroke.  Your goal weight range is:    Your goal Body Mass Index (BMI) is 19-24.  Healthy food habits can help reduce 3 risk factors for stroke:  High cholesterol, hypertension, and excess weight.  RESOURCES Stroke/Support Group:  Call [phone]   STROKE EDUCATION PROVIDED/REVIEWED AND GIVEN  TO PATIENT Stroke warning signs and symptoms How to activate emergency medical system (call 911). Medications prescribed at discharge. Need for follow-up after discharge. Personal risk factors for stroke. Pneumonia vaccine given:  Flu vaccine given:  My questions have been answered, the writing is legible, and I understand these instructions.  I will adhere to these goals & educational materials that have been provided to  me after my discharge from the hospital.   __ Bathe/dress with assistance ___ Walk Independently    ___ Shower independently ___ Walk with assistance    ___ Shower with assistance ___ No alcohol     ___ Return to work/school ________  Special Instructions: 5 x 5 foam dressing to buttocks/gluteal fold change every 3 days as needed   COMMUNITY REFERRALS UPON DISCHARGE:    Home Health:   PT, OT, SP, RN   Agency[facility] Phone:[phone]  Date of last service:05/01/2016  OUTPATIENT REHAB: [facility]-PT,OT,SP APPOINTMENTS: 12/12-Tuesday 9:15-11:00 AM-PT & OT AND SP-12/27 @ 2:00 PM  Medical Equipment/Items Ordered:HAS FROM PREVIOUS ADMITS     GENERAL COMMUNITY RESOURCES FOR PATIENT/FAMILY: Support Groups:[facility] SUPPORT GROUP EVERY SECOND Thursday @ 3:00-4:00 PM ON THE REHAB UNIT QUESTIONS CONTACT KATIE  [phone]  My questions have been answered and I understand these instructions. I will adhere to these goals and the provided educational materials after my discharge from the hospital.  Patient/Caregiver Signature _______________________________ Date __________  Clinician Signature _______________________________________ Date __________  Please bring this form and your medication list with you to all your follow-up doctor's appointments.

## 2016-04-29 NOTE — Progress Notes (Signed)
[facility] PHYSICAL MEDICINE & REHABILITATION     PROGRESS NOTE    Subjective/Complaints: No issues overnite Pt had seen physiatrist in [place]  ROS limited due to cognition  Objective: Vital Signs: Blood pressure 126/68, pulse 88, temperature 98.1 F (36.7 C), temperature source Oral, resp. rate 16, height 5\' 7"  (1.702 m), weight 74.5 kg (164 lb 3.9 oz), SpO2 98 %. No results found. No results for input(s): WBC, HGB, HCT, PLT in the last 72 hours.  Recent Labs  04/28/16 0726  NA 137  K 4.3  CL 106  GLUCOSE 101*  BUN 19  CREATININE 0.56  CALCIUM 9.8   CBG (last 3)   Recent Labs  04/28/16 1639 04/28/16 2042 04/29/16 0632  GLUCAP 184* 179* 136*    Wt Readings from Last 3 Encounters:  04/29/16 74.5 kg (164 lb 3.9 oz)  04/05/16 73.3 kg (161 lb 9.6 oz)  01/15/16 75.2 kg (165 lb 12.8 oz)    Physical Exam:  Constitutional: lying comfortably in bed HENT:  Head: Normocephalic and atraumatic.  Right Ear: External ear normal.  Left Ear: External ear normal.  Mouth/Throat: Oropharynx is clear and moist.  Eyes: Conjunctivae and EOM are normal. Pupils are equal, round, and reactive to light. No scleral icterus.  Neck: No JVD present. No tracheal deviation present. No thyromegaly present.  Cardiovascular: Normal rate, regular rhythm and intact distal pulses.  Exam reveals no friction rub.   No murmur heard. Respiratory: Effort normal. No respiratory distress. She has no wheezes. She has no rales. She exhibits no tenderness.  GI: She exhibits no distension. There is no tenderness. There is no rebound.  Musculoskeletal: She exhibits no edema. Tenderness:.No pain with RUE ROM, Neurological: She is anxious/confused. abnormal muscle tone (MAS  biceps 2/4, wrist flexors 1/4. Pain with attempted finger ext and wrist ext and elbow ext Coordination normal.  RUE and RLE 4 to 4+/5. LUE underlying 1/4 deltoid, biceps, triceps, wrist and finger flexors.  LLE 1+ HF, KE and 0/5 ADF/PF.--no  change   Skin: mild erythema sup gluteal cleft   Psychiatric: She has a normal mood and affect.  Finger flexors are relaxed today, pain with elbow ROM, tone related  Assessment/Plan: 1. 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 Physiatrist is providing close team supervision and 24 hour management of active medical problems listed below. Physiatrist and rehab team continue to assess barriers to discharge/monitor patient progress toward functional and medical goals. Pt and husband to decide if [facility].  Function:  Bathing Bathing position   Position: Shower  Bathing parts Body parts bathed by patient: Left arm, Chest, Abdomen, Front perineal area, Right upper leg, Left upper leg Body parts bathed by helper: Right lower leg, Left lower leg, Back, Buttocks  Bathing assist Assist Level:  (mod A)      Upper Body Dressing/Undressing Upper body dressing   What is the patient wearing?: Pull over shirt/dress     Pull over shirt/dress - Perfomed by patient: Put head through opening, Pull shirt over trunk Pull over shirt/dress - Perfomed by helper: Thread/unthread right sleeve, Thread/unthread left sleeve, Put head through opening, Pull shirt over trunk        Upper body assist Assist Level:  (mod A)      Lower Body Dressing/Undressing Lower body dressing Lower body dressing/undressing activity did not  occur: Refused What is the patient wearing?: Pants, Socks, Underwear Underwear - Performed by patient: Thread/unthread right underwear leg Underwear - Performed by helper: Thread/unthread left underwear leg, Pull underwear up/down Pants- Performed by patient: Thread/unthread right pants leg Pants- Performed by helper: Thread/unthread left pants leg, Pull pants up/down Non-skid slipper socks-  Performed by patient: Don/doff right sock Non-skid slipper socks- Performed by helper: Don/doff right sock, Don/doff left sock   Socks - Performed by helper: Don/doff right sock, Don/doff left sock Shoes - Performed by patient: Don/doff right shoe Shoes - Performed by helper: Don/doff right shoe, Don/doff left shoe, Fasten right, Fasten left (Right shoe donned due to time )   AFO - Performed by helper: Don/doff right AFO   TED Hose - Performed by helper: Don/doff right TED hose, Don/doff left TED hose  Lower body assist Assist for lower body dressing:  (Max A)      Toileting Toileting Toileting activity did not occur: No continent bowel/bladder event Toileting steps completed by patient: Adjust clothing prior to toileting, Performs perineal hygiene, Adjust clothing after toileting Toileting steps completed by helper: Adjust clothing after toileting Toileting Assistive Devices: Grab bar or rail  Toileting assist Assist level: Touching or steadying assistance (Pt.75%)   Transfers Chair/bed transfer   Chair/bed transfer method: Stand pivot Chair/bed transfer assist level: Touching or steadying assistance (Pt > 75%) Chair/bed transfer assistive device: Orthosis     Locomotion Ambulation     Max distance: 25 Assist level: Moderate assist (Pt 50 - 74%)   Wheelchair   Type: Manual Max wheelchair distance: 150 Assist Level: Supervision or verbal cues  Cognition Comprehension Comprehension assist level: Understands basic 75 - 89% of the time/ requires cueing 10 - 24% of the time  Expression Expression assist level: Expresses basic 75 - 89% of the time/requires cueing 10 - 24% of the time. Needs helper to occlude trach/needs to repeat words.  Social Interaction Social Interaction assist level: Interacts appropriately 50 - 74% of the time - May be physically or verbally inappropriate.  Problem Solving Problem solving assist level: Solves basic 25 - 49% of the time - needs direction more  than half the time to initiate, plan or complete simple activities  Memory Memory assist level: Recognizes or recalls 25 - 49% of the time/requires cueing 50 - 75% of the time   Medical Problem List and Plan: 1.  Left spastic hemiplegia/dysarthria secondary to right pontine infarct with recent extension             -continue CIR PT, OT, SLP,Will plan d/c 11/19        2.  DVT Prophylaxis/Anticoagulation: Lovenox. Monitor for any bleeding episode  3. Pain Management with recent fall and persistent rib cage pain. Also has had prominent spasms/cramping at night  -Lidoderm patch increase to 3 patches Baclofen15 mg 3 times a day helping with tone, Robaxin as needed.  -off Lyrica 4. Mood: Zoloft 50 mg daily, 5. Neuropsych: This patient is not capable of making decisions on her own behalf.   - -  -ua/ucx positive e coli 100K on Keflex 6. Skin/Wound Care: Routine skin checks,MASD improved, skin tear healed 7. 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 Hemoglobin A1c 6.7.  Check blood sugars before meals and at bedtime  -  sugars low given sparce po intake  -increase levemir   Levimir reduced to home dose 25U -Started Glucophage 1000 mg BID 11/6 CBG (last 3)   Recent Labs  04/28/16 1639 04/28/16 2042 04/29/16 0632  GLUCAP 184* 179* 136*    9.Hypertension with bouts of  orthostasis. Presently on Norvasc 5 mg daily. Patient also on lisinopril 40 mg daily as well as Accuretic prior to admission. Resume as needed Had a mild dip to SBP 100mmHg asymptomatic will check ortho vitals 10 Hyperlipidemia .Zocor 40 mg daily 12. Spastic left hemiparesis, acute on chronic             -baclofen just resumed , Elbow flexor spasticity better, still has pain with PROM FFs             -consider botox injections, discussed  with husband who is comfortable with that             -continue splinting/ROM-s/p Botox to Elbow and wrist flexors 11/10 13. Poor po intake/altered mentation  -NGT placed- TF at noc ,Trial Megace, cal ct per dietary,will not d/c on Megace 14.Basilar stenosis Plavix per neuro     LOS (Days) 21 A FACE TO FACE EVALUATION WAS PERFORMED  Erick ColaceKIRSTEINS,Jahmez Bily E 04/29/2016 7:13 AM

## 2016-04-30 ENCOUNTER — Inpatient Hospital Stay (HOSPITAL_COMMUNITY): Payer: BC Managed Care – PPO | Admitting: Speech Pathology

## 2016-04-30 ENCOUNTER — Inpatient Hospital Stay (HOSPITAL_COMMUNITY): Payer: BC Managed Care – PPO | Admitting: Occupational Therapy

## 2016-04-30 ENCOUNTER — Inpatient Hospital Stay (HOSPITAL_COMMUNITY): Payer: BC Managed Care – PPO | Admitting: Physical Therapy

## 2016-04-30 LAB — GLUCOSE, CAPILLARY
GLUCOSE-CAPILLARY: 83 mg/dL (ref 65–99)
GLUCOSE-CAPILLARY: 94 mg/dL (ref 65–99)
Glucose-Capillary: 123 mg/dL — ABNORMAL HIGH (ref 65–99)
Glucose-Capillary: 83 mg/dL (ref 65–99)

## 2016-04-30 NOTE — Progress Notes (Signed)
Speech Language Pathology Discharge Summary  Patient Details  Name: Shelly Mcdaniel MRN: [id number] Date of Birth: 22-Mar-1956  Today's Date: 04/30/2016 SLP Individual Time: 1130-1200 SLP Individual Time Calculation (min): 30 min    Skilled Therapeutic Interventions:  Pt was seen for skilled ST targeting completion of family education and grad day activities.  Pt's husband was present at the beginning of today's therapy session and was provided with skilled education regarding distraction management techniques to maximize pt's functional independence during basic tasks given her current cognitive limitations.  Pt's husband verbalized understanding and all questions were answered to his satisfaction at this time.  Pt was able to complete clock drawing task on [employer] standardized cognitive test for 2 out of 3 accuracy with missed points for hand placement. She was unable to complete trailmaking subtest due to decreased attention to task and poor working memory of task goal.  Pt was 100% accurate for 5 digit repetition but unable to repeat 3 number sequences in reverse order due to working memory impairment.  Pt was left in recliner at the end of today's therapy session with quick release belt donned and call bell within reach.  Pt is ready for discharge tomorrow.     Patient has met 5 of 5 long term goals.  Patient to discharge at overall Min;Mod level.  Reasons goals not met:     Clinical Impression/Discharge Summary:   Pt has made good functional gains while inpatient, particularly in the last 2-3 days s/p treatment for UTI.  Pt is an overall min-mod assist level for basic cognitive tasks due to moderate cognitive impairment characterized by decreased sustained attention to tasks, decreased functional problem solving, decreased intellectual/emergent awareness of deficits, and decreased recall of information.  Pt has demonstrated improved task initiation with overleared, familiar tasks.  Pt is consuming  regular textures and thin liquids with full supervision for use of swallowing precautions due to cognitive impairment mentioned above.  Pt is discharging home with 24/7 supervision from family and recommendations for ST follow up at next level of care.  Pt and family education is complete at this time.    Care Partner:  Caregiver Able to Provide Assistance: Yes  Type of Caregiver Assistance: Physical;Cognitive  Recommendation:  Home Health SLP;Outpatient SLP;24 hour supervision/assistance  Rationale for SLP Follow Up: Maximize functional communication;Maximize cognitive function and independence;Reduce caregiver burden;Maximize swallowing safety   Equipment: none recommended by SLP    Reasons for discharge: Discharged from hospital   Patient/Family Agrees with Progress Made and Goals Achieved: Yes   Function:  Eating Eating   Modified Consistency Diet: No Eating Assist Level: Supervision or verbal cues;Set up assist for   Eating Set Up Assist For: Opening containers       Cognition Comprehension Comprehension assist level: Understands basic 90% of the time/cues < 10% of the time  Expression   Expression assist level: Expresses basic needs/ideas: With extra time/assistive device  Social Interaction Social Interaction assist level: Interacts appropriately 50 - 74% of the time - May be physically or verbally inappropriate.  Problem Solving Problem solving assist level: Solves basic 50 - 74% of the time/requires cueing 25 - 49% of the time  Memory Memory assist level: Recognizes or recalls 50 - 74% of the time/requires cueing 25 - 49% of the time   Emilio Math 04/30/2016, 4:35 PM

## 2016-04-30 NOTE — Progress Notes (Signed)
Physical Therapy Discharge Summary  Patient Details  Name: Shelly Mcdaniel MRN: [id number] Date of Birth: February 19, 1956  Today's Date: 04/30/2016 PT Individual Time: [phone]   PT Individual Time Calculation: 47 min     Patient has met 9 of 9 long term goals due to improved activity tolerance, improved balance, improved postural control, increased strength, increased range of motion, decreased pain, ability to compensate for deficits, functional use of  left lower extremity, improved attention, improved awareness and improved coordination.  Patient to discharge at a wheelchair level [place].   Patient's care partner is independent to provide the necessary physical and cognitive assistance at discharge.   Recommendation:  Patient will benefit from ongoing skilled PT services in home health setting to continue to advance safe functional mobility, address ongoing impairments in balance, awareness, functional use of LLE, improved safety with transfers and gait, as well as improve coordination, and minimize fall risk.  Equipment: No equipment provided Patient has all equipment from previous hospitalization.   Reasons for discharge: treatment goals met and discharge from hospital  Patient/family agrees with progress made and goals achieved: Yes   PT Treatment:   Patient received supine in bed and agreeable to PT. Patient instructed in toilet transfer with min assist to R. Clothing management with min assist from PT standing in front of toilet. PT instructed patient in grad day assessment; see below for results. Patient educated in car transfers, [place] mobility, basic transfers, gait, and safety in home as listed below. Following PT treatment, patient left sitting in recliner with call bell in reach with all needs met.   PT Discharge Precautions/Restrictions  Vital Signs Therapy Vitals Temp: 97.5 F (36.4 C) Temp Source: Oral Pulse Rate: 86 Resp: 17 BP: 122/76 Patient Position (if  appropriate): Lying Oxygen Therapy SpO2: 99 % O2 Device: Not Delivered Pain Pain Assessment Pain Assessment: No/denies pain Pain Score: 0-No pain Pain Type: Acute pain Pain Location: Buttocks Pain Descriptors / Indicators: Aching Pain Onset: On-going Pain Intervention(s): Medication (See eMAR)  Cognition Overall Cognitive Status: Impaired/Different from baseline Arousal/Alertness: Awake/alert Orientation Level: Oriented to person;Oriented to place;Oriented to situation Attention: Sustained Sustained Attention: Impaired Sustained Attention Impairment: Functional basic;Verbal basic Memory: Impaired Memory Impairment: Storage deficit;Retrieval deficit Awareness: Impaired Awareness Impairment: Emergent impairment Problem Solving: Impaired Problem Solving Impairment: Functional basic;Verbal basic Behaviors: Impulsive Safety/Judgment: Impaired Sensation Sensation Light Touch: Appears Intact Stereognosis: Not tested Hot/Cold: Not tested Proprioception: Not tested Additional Comments: Pt still with increased LUE pain in the digits, and shoulder with PROM/AAROM Coordination Gross Motor Movements are Fluid and Coordinated: No Fine Motor Movements are Fluid and Coordinated: No Coordination and Movement Description: Pt with Brunnstrum stage II movement only in the LUE at the shoulder.  Slight shoulder elevation present with synergy pattern developing.  circumduction with gait to advance LLE.  Motor  Motor Motor: Hemiplegia;Abnormal postural alignment and control Motor - Discharge Observations: Pt still with significant LUE and LLE hemiparesis.    Mobility Bed Mobility Rolling Right: 4: Min assist Rolling Left: 4: Min assist Rolling Left Details: Verbal cues for technique;Verbal cues for precautions/safety;Manual facilitation for placement Supine to Sit: 5: Supervision;With rails Supine to Sit Details: Verbal cues for gait pattern;Verbal cues for safe use of DME/AE Sit to  Supine: 4: Min assist Sit to Supine - Details: Verbal cues for safe use of DME/AE;Verbal cues for precautions/safety;Manual facilitation for weight shifting Transfers Transfers: Yes Sit to Stand: 4: Min assist Sit to Stand Details: Verbal cues for precautions/safety;Verbal cues for gait  pattern;Tactile cues for weight shifting;Tactile cues for posture Stand to Sit: 4: Min assist Stand to Sit Details (indicate cue type and reason): Verbal cues for safe use of DME/AE;Verbal cues for precautions/safety Stand Pivot Transfers: 4: Min assist Stand Pivot Transfer Details: Verbal cues for technique;Verbal cues for precautions/safety;Verbal cues for gait pattern;Manual facilitation for weight shifting Locomotion  Ambulation Ambulation: Yes Ambulation/Gait Assistance: 3: Mod assist Ambulation Distance (Feet): 40 Feet Assistive device: Rolling walker;Other (Comment) (AFO, Hand splint) Ambulation/Gait Assistance Details: Verbal cues for gait pattern;Verbal cues for safe use of DME/AE;Verbal cues for precautions/safety;Manual facilitation for weight shifting;Manual facilitation for placement;Verbal cues for technique;Verbal cues for sequencing Gait Gait: Yes Gait Pattern: Impaired Gait Pattern: Left hip hike;Left steppage;Decreased hip/knee flexion - left;Step-to pattern Stairs / Additional Locomotion Stairs: Yes Stairs Assistance: 6: Modified independent (Device/Increase time) Stair Management Technique: One rail Right Number of Stairs: 8 Height of Stairs: 3 Wheelchair Mobility Wheelchair Mobility: Yes Wheelchair Assistance: 5: Careers information officer: Right upper extremity;Right lower extremity Wheelchair Parts Management: Supervision/cueing Distance: 2105f   Trunk/Postural Assessment  Cervical Assessment Cervical Assessment: Exceptions to [facility](cervical flexion with slight rotation to the right) Thoracic Assessment Thoracic Assessment: Exceptions to [facility](thoracic rounding with  left trunk passive elongation with right side shortening) Lumbar Assessment Lumbar Assessment: Exceptions to [facility](maintains posterior pelvic tilt in sitting) Postural Control Postural Control: Deficits on evaluation  Balance Balance Balance Assessed: Yes Static Sitting Balance Static Sitting - Balance Support: Right upper extremity supported;Feet supported Static Sitting - Level of Assistance: 5: Stand by assistance Dynamic Sitting Balance Dynamic Sitting - Balance Support: Feet supported Dynamic Sitting - Level of Assistance: 4: Min aInsurance risk surveyorStanding - Balance Support: Right upper extremity supported Static Standing - Level of Assistance: 4: Min assist Dynamic Standing Balance Dynamic Standing - Balance Support: During functional activity Dynamic Standing - Level of Assistance: 4: Min assist Extremity Assessment      RLE Assessment RLE Assessment: Within Functional Limits RLE AROM (degrees) RLE Overall AROM Comments: WFL RLE Strength RLE Overall Strength Comments: 4+/5 proximal to distal.  LLE Assessment LLE Assessment: Exceptions to WFL LLE AROM (degrees) LLE Overall AROM Comments: lackign 25% hip flexion due to strength, lacking 15 degrees knee extension LLE PROM (degrees) LLE Overall PROM Comments: WFL LLE Strength LLE Overall Strength Comments: 3+/5 proximal to distal    See Function Navigator for Current Functional Status.  ALorie Phenix11/18/2017, 4:07 PM

## 2016-04-30 NOTE — Discharge Summary (Signed)
NAMLoretha Brasil:  Ambers, Jermani                 ACCOUNT NO.:  [id number]  MEDICAL RECORD NO.:  [id number]  LOCATION:  [id number]                        FACILITY:  [facility]  PHYSICIAN:  Erick ColaceAndrew E. Kirsteins, M.D.DATE OF BIRTH:  1956-04-26  DATE OF ADMISSION:  04/08/2016 DATE OF DISCHARGE:  05/01/2016                              DISCHARGE SUMMARY   DISCHARGE DIAGNOSES: 1. 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 2. Subcutaneous Lovenox for deep vein thrombosis prophylaxis. 3. Pain management. 4. Mood. 5. Diabetes mellitus, peripheral neuropathy. 6. Escherichia coli urinary tract infection. 7. Hypertension. 8. Hyperlipidemia. 9. Decreased nutritional storage, Escherichia coli urinary tract     infection. 10.Skin-wound.  HISTORY OF PRESENT ILLNESS:  This is a 60 year old right-handed female with history of hypertension, diabetes mellitus, history of falls, as well as CVA in 2017 while vacationing [facility] with residual left-sided weakness, maintained on aspirin.  Per chart review, she lives with spouse, used a quad cane prior to admission.  She had recently been receiving outpatient therapies. Presented on April 05, 2016, with altered mental status and dysarthria.  The patient had a fall in March with resultant persistent left rib cage pain.  Medications adjusted for pain starting with baclofen in September, Lyrica in August, had been on Neurontin without any significant dosage changes.  Urine drug screen was negative.  In the ED, she was tachycardic, systolic pressure in the 170.  CT scan of the head negative.  MRI showed foci diffusion restriction within the right hemi-pons and abnormal signal in the middle cerebellar peduncle, most likely representing acute to subacute ischemia.  No hemorrhage.  MRA with no large vessel occlusion or aneurysm.  The patient did not receive tPA.  EEG with generalized slowing pattern nonspecific consistent  with global encephalopathic process.  No seizure activity.  Neurology consulted, maintained on aspirin and Plavix for CVA prophylaxis. Physical and occupational therapy ongoing.  The patient was admitted for a comprehensive rehab program.  PAST MEDICAL HISTORY:  See discharge diagnoses.  SOCIAL HISTORY:  Lives with spouse.  Used a quad cane.  Functional status upon admission to rehab services was max assist, stand pivot transfers; max assist, side lying to sitting; mod to max assist, activities of daily living.  PHYSICAL EXAMINATION:  VITAL SIGNS:  Blood pressure 183/90, pulse 116, temperature 97, respirations 18. GENERAL:  This was an alert female, some delay in processing.  She does show some decrease insight and awareness, but overall cooperative. LUNGS:  Clear to auscultation without wheeze. CARDIAC:  Regular rate and rhythm without murmur. ABDOMEN:  Soft, nontender.  Good bowel sounds. EXTREMITIES:  She exhibits some abnormal muscle tone.  Left shoulder adductors 3/4, biceps 2/4, wrist finger flexors 2/4, 1/4 knee flexors, plantar flexors 4-5 beats clonus at left ankle.  REHABILITATION HOSPITAL COURSE:  The patient was admitted to inpatient rehab services.  The following issues were addressed.  In regard to Ms. Korte's right pontine infarct, left spastic hemiplegia, dysarthria she remained on aspirin and Plavix therapy, would follow up with Neurology Services.  She was attending full therapies.  Pain management with history of fall, persistent rib pain.  The patient also had prominent  spasms, cramping at night.  She continued on baclofen, titrated as needed.  She had been on Lyrica, discontinued due to some somnolence. Doing well with the addition of Lidoderm patch.  Ritalin was initiated to help the patient attend to focus and ability to complete task, appeared to provide good results.  She continued on Zoloft with emotional support provided.  Blood sugars had some variables  due to appetite wax and wane.  She was receiving nasogastric tube feeds for nutritional support as well as the addition of Megace to stimulate appetite.  Her nasogastric tube was later removed.  She received dietary followup as well as family bringing in meals from home.  Blood pressures controlled and monitored.  She did have an inner gluteal fold, bilateral buttocks red macerated, was receiving skin care.  Attempts for pressure release using foam dressing to buttocks, changed every 3 days as needed with followup per wound care nurse.  In regard to her spastic left hemiparesis, acute on chronic, again baclofen had been resumed and titrated.  There was consideration for Botox injection to elbow and wrist flexors received on April 22, 2016, continued to be monitored. The patient received weekly collaborative interdisciplinary team conferences.  Stand pivot transfers to wheelchair with minimal assist from physical therapy with moderate cues for proper upper extremity placement and stepping technique, performed supine to sit with minimal assistance from physical therapy to exit on the left side of the bed and minimal use of bed rails.  The patient instructed in car transfers with use of rolling walker, left upper extremity hand splint.  Minimal assist from physical therapy to prevent posterior loss of balance and assist with left lower extremity management.  Education ongoing with her husband.  Proper guarding techniques were demonstrated.  Working with assistance transfer stand pivot from bedside, commode to wheelchair for transition to shower.  The patient completed bathing, sitting on a shower seat with overall max instructions and some cuing.  Spouse did observe techniques for ADLs.  All equipment needs were addressed. Again, she was tolerating a regular consistency diet.  Close monitoring of intake and output.  The patient was able to express her needs as well as social interaction  continued to improve.  She still needed some cuing for basic problem solving.  Plan was for discharge to home on May 01, 2016.  DISCHARGE MEDICATIONS:  At time of dictation included: 1. Norvasc 5 mg p.o. daily. 2. Aspirin 325 mg p.o. daily. 3. Baclofen 15 mg p.o. t.i.d. 4. Keflex 250 mg p.o. every 8 hours completing for E. coli urinary     tract infection. 5. Plavix 75 mg p.o. daily. 6. Pepcid 20 mg p.o. b.i.d. 7. Levemir 25 units subcu at bedtime. 8. Lidoderm patch, change as directed. 9. Lisinopril 10 mg p.o. daily. 10.Melatonin 6 mg at bedtime. 11.Glucophage 850 mg p.o. b.i.d. 12.Ritalin 5 mg p.o. b.i.d. 13.MiraLax twice daily, hold for loose stools. 14.Potassium chloride 10 mEq p.o. b.i.d. 15.Crestor 5 mg p.o. at bedtime. 16.Scopolamine patch, change every 72 hours. 17.Zoloft 50 mg p.o. daily. 18.Ultram 50 mg p.o. every 6 hours as needed pain.  DIET:  Diabetic diet.  FOLLOWUP:  She would follow up with Dr. Claudette Laws at the outpatient rehab center as directed; Dr. Roda Shutters, Neurology Services; Dr. Merlene Laughter, medical management.  SPECIAL INSTRUCTIONS:  Included a 5 x 5 foam dressing to buttocks gluteal fold, change every 3 days as needed.  Home therapies of physical, occupational, speech therapy, and nurse had been arranged.  Mariam Dollaraniel Khaliel Morey, P.A.   ______________________________ Erick ColaceAndrew E. Kirsteins, M.D.    DA/MEDQ  D:  04/29/2016  T:  04/30/2016  Job:  [id number]  cc:   Hal T. Stoneking, M.D.

## 2016-04-30 NOTE — Progress Notes (Signed)
[place] PHYSICAL MEDICINE & REHABILITATION     PROGRESS NOTE    Subjective/Complaints: Feeling quite well. Had questions about ritalin  ROS: Pt denies fever, rash/itching, headache, blurred or double vision, nausea, vomiting, abdominal pain, diarrhea, chest pain, shortness of breath, palpitations, dysuria, dizziness, neck pain, back pain, bleeding, anxiety, or depression  Objective: Vital Signs: Blood pressure (!) 154/86, pulse 83, temperature 98.3 F (36.8 C), temperature source Oral, resp. rate 16, height 5\' 7"  (1.702 m), weight 74.8 kg (164 lb 14.5 oz), SpO2 100 %. No results found. No results for input(s): WBC, HGB, HCT, PLT in the last 72 hours.  Recent Labs  04/28/16 0726  NA 137  K 4.3  CL 106  GLUCOSE 101*  BUN 19  CREATININE 0.56  CALCIUM 9.8   CBG (last 3)   Recent Labs  04/29/16 1655 04/29/16 2057 04/30/16 0624  GLUCAP 145* 146* 123*    Wt Readings from Last 3 Encounters:  04/30/16 74.8 kg (164 lb 14.5 oz)  04/05/16 73.3 kg (161 lb 9.6 oz)  01/15/16 75.2 kg (165 lb 12.8 oz)    Physical Exam:  Constitutional: lying comfortably in bed HENT:  Head: Normocephalic and atraumatic.  Right Ear: External ear normal.  Left Ear: External ear normal.  Mouth/Throat: Oropharynx is clear and moist.  Eyes: Conjunctivae and EOM are normal. Pupils are equal, round, and reactive to light. No scleral icterus.  Neck: -JVD.  Cardiovascular: RRR   No murmur heard. Respiratory: CTA Bilaterally GI: She exhibits no distension. +BS Musculoskeletal: She exhibits no edema. Tenderness:.No pain with RUE ROM, Neurological: She is anxious/confused. abnormal muscle tone (MAS  biceps 2/4, wrist flexors 1/4. Pain with attempted finger ext and wrist ext and elbow ext Coordination normal.  RUE and RLE 4 to 4+/5. LUE underlying 1/4 deltoid, biceps, triceps, wrist and finger flexors.  LLE 1+ HF, KE and 0/5 ADF/PF.--stable   Skin: mild erythema sup gluteal cleft   Psychiatric: She  has a normal mood and affect.  Finger flexors are relaxed today, pain with elbow ROM, tone related  Assessment/Plan: 1. 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 Physiatrist is providing close team supervision and 24 hour management of active medical problems listed below. Physiatrist and rehab team continue to assess barriers to discharge/monitor patient progress toward functional and medical goals. Pt and husband to decide if they plan to have [facility] f/[facility] or with me.  Function:  Bathing Bathing position   Position: Shower  Bathing parts Body parts bathed by patient: Left arm, Chest, Abdomen, Front perineal area, Right upper leg, Left upper leg Body parts bathed by helper: Right lower leg, Left lower leg, Back, Buttocks  Bathing assist Assist Level:  (mod A)      Upper Body Dressing/Undressing Upper body dressing   What is the patient wearing?: Pull over shirt/dress     Pull over shirt/dress - Perfomed by patient: Put head through opening, Pull shirt over trunk Pull over shirt/dress - Perfomed by helper: Thread/unthread right sleeve, Thread/unthread left sleeve, Put head through opening, Pull shirt over trunk        Upper body assist Assist Level:  (mod A)      Lower Body Dressing/Undressing Lower body dressing Lower body dressing/undressing activity did not occur: Refused What is the patient wearing?: Pants Underwear - Performed by patient: Thread/unthread right underwear leg Underwear - Performed by helper:  Thread/unthread left underwear leg, Pull underwear up/down Pants- Performed by patient: Thread/unthread right pants leg Pants- Performed by helper: Thread/unthread left pants leg, Pull pants up/down Non-skid slipper socks- Performed by patient: Don/doff right sock Non-skid slipper socks- Performed by  helper: Don/doff right sock, Don/doff left sock   Socks - Performed by helper: Don/doff right sock, Don/doff left sock Shoes - Performed by patient: Don/doff right shoe Shoes - Performed by helper: Don/doff right shoe, Don/doff left shoe, Fasten right, Fasten left (Right shoe donned due to time )   AFO - Performed by helper: Don/doff right AFO   TED Hose - Performed by helper: Don/doff right TED hose, Don/doff left TED hose  Lower body assist Assist for lower body dressing:  (Max A)      Toileting Toileting Toileting activity did not occur: No continent bowel/bladder event Toileting steps completed by patient: Adjust clothing prior to toileting, Performs perineal hygiene Toileting steps completed by helper: Adjust clothing after toileting Toileting Assistive Devices: Grab bar or rail  Toileting assist Assist level: Touching or steadying assistance (Pt.75%)   Transfers Chair/bed transfer   Chair/bed transfer method: Stand pivot Chair/bed transfer assist level: Touching or steadying assistance (Pt > 75%) Chair/bed transfer assistive device: Armrests     Locomotion Ambulation     Max distance: 3925ft  Assist level: Moderate assist (Pt 50 - 74%)   Wheelchair   Type: Manual Max wheelchair distance: 13860ft  Assist Level: Supervision or verbal cues  Cognition Comprehension Comprehension assist level: Understands basic 75 - 89% of the time/ requires cueing 10 - 24% of the time  Expression Expression assist level: Expresses basic 75 - 89% of the time/requires cueing 10 - 24% of the time. Needs helper to occlude trach/needs to repeat words.  Social Interaction Social Interaction assist level: Interacts appropriately 50 - 74% of the time - May be physically or verbally inappropriate.  Problem Solving Problem solving assist level: Solves basic 25 - 49% of the time - needs direction more than half the time to initiate, plan or complete simple activities  Memory Memory assist level:  Recognizes or recalls 25 - 49% of the time/requires cueing 50 - 75% of the time   Medical Problem List and Plan: 1.  Left spastic hemiplegia/dysarthria secondary to right pontine infarct with recent extension             -continue CIR PT, OT, SLP,  - d/c 11/19      2.  DVT Prophylaxis/Anticoagulation: Lovenox. Monitor for any bleeding episode  3. Pain Management with recent fall and persistent rib cage pain. Also has had prominent spasms/cramping at night  -Lidoderm patch increase to 3 patches Baclofen15 mg 3 times a day helping with tone, Robaxin as needed.  -off Lyrica 4. Mood: Zoloft 50 mg daily, 5. Neuropsych: This patient is not capable of making decisions on her own behalf.   - -  -ua/ucx positive e coli 100K on Keflex 6. Skin/Wound Care: Routine skin checks,MASD improved, skin tear healed 7. 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 Hemoglobin A1c 6.7.  Check blood sugars before meals and at bedtime  -sugars low given sparce po intake  -increase levemir   Levimir reduced to home dose 25U -Started Glucophage 1000 mg BID    -fair control at present CBG (last 3)   Recent Labs  04/29/16 1655 04/29/16 2057 04/30/16 [id number]  GLUCAP 145* 146* 123*    9.Hypertension with bouts of  orthostasis. Presently on Norvasc 5 mg daily. Patient also on lisinopril 40 mg daily as well as Accuretic prior to admission. Resume as needed Had a mild dip to SBP asymptomatic will check ortho vitals 10 Hyperlipidemia .Zocor 40 mg daily 12. Spastic left hemiparesis, acute on chronic             -baclofen just resumed , Elbow flexor spasticity better, still has pain with PROM FFs             -continue splinting/ROM-s/p Botox to Elbow and wrist flexors 11/10 13. 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

## 2016-04-30 NOTE — Progress Notes (Signed)
Occupational Therapy Session Note  Patient Details  Name: Shelly Mcdaniel MRN: [id number] Date of Birth: [birth year]  Today's Date: 04/30/2016 OT Individual Time: 1657-9038 OT Individual Time Calculation (min): 60 min    Short Term Goals: Week 1:  OT Short Term Goal 1 (Week 1): Pt will initate and complete UB bathing with min assist and no more than mod instructional cueing.  OT Short Term Goal 1 - Progress (Week 1): Not met OT Short Term Goal 2 (Week 1): Pt will perform LB bathing with mod assist sit to stand.   OT Short Term Goal 2 - Progress (Week 1): Not met OT Short Term Goal 3 (Week 1): Pt will maintain static sitting balance EOB with supervison in preparation for selfcare tasks and functional transfers.   OT Short Term Goal 3 - Progress (Week 1): Not met OT Short Term Goal 4 (Week 1): Pt will complete stand pivot transfer to 3:1 with mod assist 2 consecutive sessions.   OT Short Term Goal 4 - Progress (Week 1): Not met OT Short Term Goal 5 (Week 1): Pt will tolerate 70 degrees AAROM at the left shoulder with pain less than 4/10.  OT Short Term Goal 5 - Progress (Week 1): Met  Skilled Therapeutic Interventions/Progress Updates:   Patient eating breakfast with nursing staff supervision upon approach for OT.   She continued her breakfast and ate approximately 20% with extra time and with coughs when she talked as she ate.  This clinician encouraged her to not talk while food was in her.      She was able complete hand to mouth self feeding with right hand without any spillage.  Mouth.       Patient stated she'd bathed last night and agreed to at least wash face, arm pits and sit to stand periarea with overall Mod A bathing  for armpits and periarea.    Patient required verbal cues and redirection x3 for memory and problem solving to doff sleep shirt and don clean shirt.  Patient was assisted back to bed and positioned on her right side to allow pressure relief on the pressure area on  her cocyyx area  Soft call bell and tv remote were left within reach, and her bed alarm was engaged.  Therapy Documentation Precautions:  Precautions Precautions: Fall Precaution Comments: painful left arm with moderate left hemiparesis  Required Braces or Orthoses: Other Brace/Splint Other Brace/Splint: AFO on LLE Restrictions Weight Bearing Restrictions: No  Pain: Pain Assessment Pain Assessment: 0-10 Pain Score: 0-No pain See Function Navigator for Current Functional Status.   Therapy/Group: Individual Therapy  Alfredia Ferguson [facility] 04/30/2016, 12:34 PM

## 2016-05-01 LAB — GLUCOSE, CAPILLARY
GLUCOSE-CAPILLARY: 120 mg/dL — AB (ref 65–99)
Glucose-Capillary: 148 mg/dL — ABNORMAL HIGH (ref 65–99)

## 2016-05-01 NOTE — Progress Notes (Signed)
05/01/16 1230 nursing Patient discharged to home per wheelchair accompanied by NT, husband and daughter.Per husband  discharge instructions was done Friday. No further questions .

## 2016-05-01 NOTE — Progress Notes (Signed)
[facility] PHYSICAL MEDICINE & REHABILITATION     PROGRESS NOTE    Subjective/Complaints: Sitting in bed. Anxious to go home!! Pleased with progress  ROS: Pt denies fever, rash/itching, headache, blurred or double vision, nausea, vomiting, abdominal pain, diarrhea, chest pain, shortness of breath, palpitations, dysuria, dizziness, neck pain, back pain, bleeding, anxiety, or depression   Objective: Vital Signs: Blood pressure (!) 163/93, pulse 94, temperature 98.4 F (36.9 C), temperature source Oral, resp. rate 18, height 5\' 7"  (1.702 m), weight 75 kg (165 lb 5.5 oz), SpO2 98 %. No results found. No results for input(s): WBC, HGB, HCT, PLT in the last 72 hours. No results for input(s): NA, K, CL, GLUCOSE, BUN, CREATININE, CALCIUM in the last 72 hours.  Invalid input(s): CO CBG (last 3)   Recent Labs  04/30/16 1718 04/30/16 2104 05/01/16 0645  GLUCAP 83 94 120*    Wt Readings from Last 3 Encounters:  05/01/16 75 kg (165 lb 5.5 oz)  04/05/16 73.3 kg (161 lb 9.6 oz)  01/15/16 75.2 kg (165 lb 12.8 oz)    Physical Exam:  Constitutional: lying comfortably in bed HENT:  Head: Normocephalic and atraumatic.  Right Ear: External ear normal.  Left Ear: External ear normal.  Mouth/Throat: Oropharynx is clear and moist.  Eyes: Conjunctivae and EOM are normal. Pupils are equal, round, and reactive to light. No scleral icterus.  Neck: -JVD.  Cardiovascular: RRR  No murmur heard. Respiratory: CTA GI: NT, ND+BS Musculoskeletal: She exhibits no edema. Tenderness:.No pain with RUE ROM, Neurological: She is anxious/confused. Tone: MAS  biceps 2/4, wrist flexors 1/4.   Coordination normal on unaffected side  RUE and RLE 4 to 4+/5. LUE underlying 1/4 deltoid, biceps, triceps, wrist and finger flexors.  LLE 1+ HF, KE and 0/5 ADF/PF.--stable   Skin: mild erythema sup gluteal cleft resolving  Psychiatric: She has a normal mood and affect.  Finger flexors are relaxed today, pain with  elbow ROM, tone related  Assessment/Plan: 1. 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 Physiatrist is providing close team supervision and 24 hour management of active medical problems listed below. Physiatrist and rehab team continue to assess barriers to discharge/monitor patient progress toward functional and medical goals. Pt and husband to decide [facility] [place].  Function:  Bathing Bathing position   Position: Sitting EOB  Bathing parts Body parts bathed by patient: Left arm, Chest, Abdomen, Front perineal area, Right upper leg, Left upper leg Body parts bathed by helper: Right arm, Right lower leg, Left lower leg, Back, Buttocks  Bathing assist Assist Level:  (mod A)      Upper Body Dressing/Undressing Upper body dressing   What is the patient wearing?: Pull over shirt/dress     Pull over shirt/dress - Perfomed by patient: Put head through opening, Pull shirt over trunk Pull over shirt/dress - Perfomed by helper: Thread/unthread right sleeve, Thread/unthread left sleeve, Pull shirt over trunk        Upper body assist Assist Level:  (mod A)      Lower Body Dressing/Undressing Lower body dressing Lower body dressing/undressing activity did not occur: Refused What is the patient wearing?: Pants Underwear - Performed by patient: Thread/unthread right underwear leg Underwear - Performed by helper: Thread/unthread left underwear leg, Pull underwear up/down Pants- Performed by patient: Thread/unthread right pants leg Pants- Performed by helper:  Thread/unthread left pants leg, Pull pants up/down, Fasten/unfasten pants Non-skid slipper socks- Performed by patient: Don/doff right sock Non-skid slipper socks- Performed by helper: Don/doff left sock   Socks - Performed by helper: Don/doff  right sock, Don/doff left sock Shoes - Performed by patient: Don/doff right shoe Shoes - Performed by helper: Don/doff left shoe, Fasten left, Fasten right   AFO - Performed by helper: Don/doff left AFO   TED Hose - Performed by helper: Don/doff right TED hose, Don/doff left TED hose  Lower body assist Assist for lower body dressing:  (Max A)      Toileting Toileting Toileting activity did not occur: No continent bowel/bladder event Toileting steps completed by patient: Performs perineal hygiene Toileting steps completed by helper: Performs perineal hygiene, Adjust clothing after toileting Toileting Assistive Devices: Grab bar or rail  Toileting assist Assist level: Touching or steadying assistance (Pt.75%)   Transfers Chair/bed transfer   Chair/bed transfer method: Stand pivot Chair/bed transfer assist level: Touching or steadying assistance (Pt > 75%) Chair/bed transfer assistive device: Armrests     Locomotion Ambulation     Max distance: 9ft  Assist level: Moderate assist (Pt 50 - 74%)   Wheelchair   Type: Manual Max wheelchair distance: 267ft  Assist Level: Supervision or verbal cues  Cognition Comprehension Comprehension assist level: Understands basic 75 - 89% of the time/ requires cueing 10 - 24% of the time  Expression Expression assist level: Expresses basic 75 - 89% of the time/requires cueing 10 - 24% of the time. Needs helper to occlude trach/needs to repeat words.  Social Interaction Social Interaction assist level: Interacts appropriately 50 - 74% of the time - May be physically or verbally inappropriate.  Problem Solving Problem solving assist level: Solves basic 25 - 49% of the time - needs direction more than half the time to initiate, plan or complete simple activities  Memory Memory assist level: Recognizes or recalls 25 - 49% of the time/requires cueing 50 - 75% of the time   Medical Problem List and Plan: 1.  Left spastic hemiplegia/dysarthria  secondary to right pontine infarct with recent extension             -continue CIR PT, OT, SLP,  - d/c today  -Patient to see Dr. Wynn Banker in the office for transitional care encounter in 1-2 weeks.    2.  DVT Prophylaxis/Anticoagulation: Lovenox. Monitor for any bleeding episode  3. Pain Management with recent fall and persistent rib cage pain. Also has had prominent spasms/cramping at night  -Lidoderm patch increase to 3 patches Baclofen15 mg 3 times a day helping with tone, Robaxin as needed.  -off Lyrica 4. Mood: Zoloft 50 mg daily, 5. Neuropsych: This patient is not capable of making decisions on her own behalf.   -ua/ucx positive e coli 100K on Keflex 6. Skin/Wound Care: Routine skin checks,MASD improved, skin tear healed 7. 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 Hemoglobin A1c 6.7.  Check blood sugars before meals and at bedtime  -sugars low given sparce po intake  -increase levemir   Levimir reduced to home dose 25U -Started Glucophage 1000 mg BID    -good control at present CBG (last 3)   Recent Labs  04/30/16 1718 04/30/16 2104 05/01/16 0645  GLUCAP 83 94 120*    9.Hypertension with bouts of  orthostasis. Presently on Norvasc 5 mg daily. Patient  also on lisinopril 40 mg daily as well as Accuretic prior to admission. Resume as needed Had a mild dip to SBP 100mmHg asymptomatic will check ortho vitals 10 Hyperlipidemia .Zocor 40 mg daily 12. Spastic left hemiparesis, acute on chronic             -baclofen just resumed , Elbow flexor spasticity better, still has pain with PROM FFs             -continue splinting/ROM-s/p Botox to Elbow and wrist flexors 11/10 13. Poor po intake/altered mentation  -NGT placed- TF at noc ,Trial Megace, cal ct per dietary,will not d/c on Megace  14.Basilar stenosis Plavix per neuro     LOS (Days) 23 A FACE TO FACE EVALUATION  WAS PERFORMED  Gwendolyn Mclees T 05/01/2016 8:52 AM

## 2016-05-02 ENCOUNTER — Ambulatory Visit: Payer: BC Managed Care – PPO | Admitting: Physical Therapy

## 2016-05-02 ENCOUNTER — Encounter: Payer: BC Managed Care – PPO | Admitting: Occupational Therapy

## 2016-05-02 ENCOUNTER — Telehealth: Payer: Self-pay

## 2016-05-02 NOTE — Telephone Encounter (Signed)
First attempt has been made for TCC. Left voicemail asking to return our call.

## 2016-05-03 ENCOUNTER — Ambulatory Visit: Payer: BC Managed Care – PPO | Admitting: Physical Therapy

## 2016-05-03 ENCOUNTER — Encounter: Payer: BC Managed Care – PPO | Admitting: Occupational Therapy

## 2016-05-03 ENCOUNTER — Encounter: Payer: BC Managed Care – PPO | Admitting: Speech Pathology

## 2016-05-03 ENCOUNTER — Telehealth: Payer: Self-pay | Admitting: *Deleted

## 2016-05-03 NOTE — Telephone Encounter (Signed)
Transitional care call completed, appointment switched to 05/12/2016, address confirmed, new visit packet sent  Transitional Care Questions  Questions for our staff to ask patients on Transitional care 48 hour phone call:   1. Are you/is patient experiencing any problems since coming home?  No, attention and focus improving Are there any questions regarding any aspect of care? No  2. Are there any questions regarding medications administration/dosing? No Are meds being taken as prescribed? Yes Patient should review meds with caller to confirm   3. Have there been any falls? No  4. Has Home Health been to the house and/or have they contacted you? No, they left a contact card If not, have you tried to contact them? No Can we help you contact them? Yes   5. Are bowels and bladder emptying properly? Patient was having difficulties follow ing discharge.  She has now settled down Are there any unexpected incontinence issues? Not currently If applicable, is patient following bowel/bladder programs?  6. Any fevers, problems with breathing, unexpected pain? No fever, no breathing problems, sporadic pain shoulder, legs on affected side, causing restlessness and irritability   7. Are there any skin problems or new areas of breakdown? No  8. Has the patient/family member arranged specialty MD follow up (ie cardiology/neurology/renal/surgical/etc)? No, husband to call and make appts with neurology and PCP.  Can we help arrange?   9. Does the patient need any other services or support that we can help arrange? No  10. Are caregivers following through as expected in assisting the patient? Yes   11. Has the patient quit smoking, drinking alcohol, or using drugs as recommended? Yes

## 2016-05-03 NOTE — Telephone Encounter (Signed)
Prior authorization submitted to [employer] for methylphenidate via phone call.  Approval was given over the phone, patient's husband notified

## 2016-05-04 ENCOUNTER — Ambulatory Visit: Payer: BC Managed Care – PPO | Admitting: Physical Therapy

## 2016-05-04 ENCOUNTER — Encounter: Payer: BC Managed Care – PPO | Admitting: Occupational Therapy

## 2016-05-09 ENCOUNTER — Encounter: Payer: BC Managed Care – PPO | Admitting: Physical Medicine & Rehabilitation

## 2016-05-10 ENCOUNTER — Encounter: Payer: BC Managed Care – PPO | Admitting: Occupational Therapy

## 2016-05-12 ENCOUNTER — Encounter: Payer: BC Managed Care – PPO | Attending: Physical Medicine & Rehabilitation

## 2016-05-12 ENCOUNTER — Encounter: Payer: Self-pay | Admitting: Physical Medicine & Rehabilitation

## 2016-05-12 ENCOUNTER — Ambulatory Visit (HOSPITAL_BASED_OUTPATIENT_CLINIC_OR_DEPARTMENT_OTHER): Payer: BC Managed Care – PPO | Admitting: Physical Medicine & Rehabilitation

## 2016-05-12 VITALS — BP 137/88 | HR 100

## 2016-05-12 DIAGNOSIS — R4182 Altered mental status, unspecified: Secondary | ICD-10-CM | POA: Diagnosis not present

## 2016-05-12 DIAGNOSIS — I69354 Hemiplegia and hemiparesis following cerebral infarction affecting left non-dominant side: Secondary | ICD-10-CM

## 2016-05-12 DIAGNOSIS — E1140 Type 2 diabetes mellitus with diabetic neuropathy, unspecified: Secondary | ICD-10-CM | POA: Diagnosis not present

## 2016-05-12 DIAGNOSIS — R269 Unspecified abnormalities of gait and mobility: Secondary | ICD-10-CM | POA: Diagnosis not present

## 2016-05-12 DIAGNOSIS — I69398 Other sequelae of cerebral infarction: Secondary | ICD-10-CM | POA: Diagnosis not present

## 2016-05-12 DIAGNOSIS — Z794 Long term (current) use of insulin: Secondary | ICD-10-CM | POA: Insufficient documentation

## 2016-05-12 DIAGNOSIS — I651 Occlusion and stenosis of basilar artery: Secondary | ICD-10-CM | POA: Diagnosis not present

## 2016-05-12 DIAGNOSIS — E785 Hyperlipidemia, unspecified: Secondary | ICD-10-CM | POA: Insufficient documentation

## 2016-05-12 DIAGNOSIS — I10 Essential (primary) hypertension: Secondary | ICD-10-CM | POA: Diagnosis not present

## 2016-05-12 DIAGNOSIS — G8110 Spastic hemiplegia affecting unspecified side: Secondary | ICD-10-CM

## 2016-05-12 NOTE — Patient Instructions (Signed)
Please call to schedule left shoulder injection  Next visit is in 1 month  May use arm sling for transfers and ambulation, but would not keep it on while seated or laying down

## 2016-05-12 NOTE — Progress Notes (Signed)
Subjective:    Patient ID: Shelly SimmerJody D Mcdaniel, female    DOB: 1956-05-25, 60 y.o.   MRN: [id number] Transitional care from inpt rehab DATE OF ADMISSION:  04/08/2016 DATE OF DISCHARGE:  05/01/2016 HPI 60 year old right-handed female with history of hypertension, diabetes mellitus, history of falls, as well as CVA in 2017 PT, OT, SLP , Wound care  Car transfers are not too difficult Walked to kitchen with cane, AFO and PT Fatigue a factor Occ disoriented  dog's name Bedside commode , helping with bladder continence. BP is on lower side Now off BP meds (amlodipine and lisinopril) with systolic in 120-130 range when seated, 150 systolic Off lisinopril ,As well as amlodipine Follows up with PCP this week Pain Inventory Average Pain 5 Pain Right Now 3 My pain is aching  In the last 24 hours, has pain interfered with the following? General activity 5 Relation with others 5 Enjoyment of life 5 What TIME of day is your pain at its worst? night Sleep (in general) Fair  Pain is worse with: . Pain improves with: . Relief from Meds: .  Mobility use a cane use a walker how many minutes can you walk? 5 ability to climb steps?  yes do you drive?  no use a wheelchair needs help with transfers Do you have any goals in this area?  yes  Function retired I need assistance with the following:  feeding, dressing, bathing, toileting, meal prep, household duties and shopping  Neuro/Psych bladder control problems weakness tremor trouble walking spasms dizziness confusion loss of taste or smell  Prior Studies Any changes since last visit?  no  Physicians involved in your care Any changes since last visit?  no   Family History  Problem Relation Age of Onset  . Stroke Mother   . Dementia Mother   . Diabetes Father   . Stroke Father   . CAD Father   . Diabetes Sister   . Diabetes Brother   . CAD Brother   . Stroke Brother   . Kidney failure Brother   . Hypertension  Other   . Hyperlipidemia Other   . Stroke Other   . Heart disease Other   . Diabetes Other    Social History   Social History  . Marital status: Married    Spouse name: N/A  . Number of children: N/A  . Years of education: N/A   Social History Main Topics  . Smoking status: Never Smoker  . Smokeless tobacco: Never Used  . Alcohol use No  . Drug use: No  . Sexual activity: Not on file   Other Topics Concern  . Not on file   Social History Narrative  . No narrative on file   Past Surgical History:  Procedure Laterality Date  . BACK SURGERY    . BREAST SURGERY    . COLONOSCOPY WITH PROPOFOL N/A 12/24/2013   Procedure: COLONOSCOPY WITH PROPOFOL;  Surgeon: Charolett BumpersMartin K Johnson, MD;  Location: [facility];  Service: Endoscopy;  Laterality: N/A;   Past Medical History:  Diagnosis Date  . Diabetes mellitus without complication (HCC)   . Hyperlipidemia   . Hypertension    There were no vitals taken for this visit.  Opioid Risk Score:   Fall Risk Score:  `1  Depression screen PHQ 2/9  [facility] 01/15/2016 08/20/2015 05/27/2015  Decreased Interest 0 0 0  Down, Depressed, Hopeless 0 0 0  PHQ - 2 Score 0 0 0   Review  of Systems  Constitutional: Positive for activity change, appetite change and unexpected weight change.  HENT: Negative.   Eyes: Negative.   Respiratory: Negative.   Cardiovascular: Negative.   Gastrointestinal: Positive for vomiting.  Endocrine: Negative.   Genitourinary: Positive for difficulty urinating.  Musculoskeletal: Positive for gait problem.  Skin: Positive for rash.  Allergic/Immunologic: Negative.   Neurological: Positive for dizziness, tremors and weakness.  Hematological: Negative.   Psychiatric/Behavioral: Positive for confusion.  All other systems reviewed and are negative.      Objective:   Physical Exam  Constitutional: She is oriented to person, place, and time. She appears well-developed and well-nourished.  HENT:    Head: Normocephalic and atraumatic.  Eyes: Conjunctivae and EOM are normal. Pupils are equal, round, and reactive to light.  Neck: Normal range of motion.  Cardiovascular: Normal rate and regular rhythm.   Pulmonary/Chest: Effort normal and breath sounds normal.  Neurological: She is alert and oriented to person, place, and time.  Skin: Skin is warm and dry.  Psychiatric: She has a normal mood and affect.  Nursing note and vitals reviewed. Motor strength is 5/5 in the right deltoid, biceps, triceps, grip as well as hip flexor, knee extensor, ankle dorsal flexor Left upper extremity has only trace flexion at elbow as well as shoulder retraction. No active movement in the finger and wrist area. Left lower extremity 3 minus in the knee extensor and hip extensor synergy. 2 minus hip flexion 0 at the foot and ankle flexors. Anterior inferior subluxation of left shoulder. Pain with external rotation as well as abduction. As well as for flexion.        Assessment & Plan:  1. Right pontine infarct with left spastic hemiparesis, nondominant Making functional improvements with mobility. Continues to have cognitive deficits. Continue home health PT, OT, speech as well as nursing for another 1-2 weeks, then transition into outpatient therapy. 2. Left shoulder pain has shoulder subluxation, which may be contributing. May use a shoulder sling when transferring or ambulating, but not when she is just sitting or laying. She also has adhesive capsulitis. She may benefit from intra-articular injection of the left shoulder. However, she declines this at the current time. 3. Hypertension, but now is normotensive off blood pressure medications. She will follow up with primary care on this. 4. Diabetes control is reported to be good, follow-up with primary care  Physical medicine and rehabilitation follow-up in one month Recommend neurology follow-up 2 months posthospitalization husband will call

## 2016-05-13 ENCOUNTER — Emergency Department (HOSPITAL_BASED_OUTPATIENT_CLINIC_OR_DEPARTMENT_OTHER)
Admit: 2016-05-13 | Discharge: 2016-05-13 | Disposition: A | Discharge status: Home / Self Care | Payer: BC Managed Care – PPO

## 2016-05-13 ENCOUNTER — Emergency Department (HOSPITAL_COMMUNITY): Payer: BC Managed Care – PPO

## 2016-05-13 ENCOUNTER — Encounter (HOSPITAL_COMMUNITY): Payer: Self-pay | Admitting: Emergency Medicine

## 2016-05-13 ENCOUNTER — Encounter (HOSPITAL_COMMUNITY): Payer: BC Managed Care – PPO

## 2016-05-13 ENCOUNTER — Emergency Department (HOSPITAL_COMMUNITY)
Admission: EM | Admit: 2016-05-13 | Discharge: 2016-05-13 | Disposition: A | Discharge status: Home / Self Care | Payer: BC Managed Care – PPO | Attending: Emergency Medicine | Admitting: Emergency Medicine

## 2016-05-13 DIAGNOSIS — R0602 Shortness of breath: Secondary | ICD-10-CM

## 2016-05-13 DIAGNOSIS — Z7982 Long term (current) use of aspirin: Secondary | ICD-10-CM | POA: Insufficient documentation

## 2016-05-13 DIAGNOSIS — M79605 Pain in left leg: Secondary | ICD-10-CM | POA: Insufficient documentation

## 2016-05-13 DIAGNOSIS — E119 Type 2 diabetes mellitus without complications: Secondary | ICD-10-CM | POA: Diagnosis not present

## 2016-05-13 DIAGNOSIS — N3000 Acute cystitis without hematuria: Secondary | ICD-10-CM | POA: Insufficient documentation

## 2016-05-13 DIAGNOSIS — Z8673 Personal history of transient ischemic attack (TIA), and cerebral infarction without residual deficits: Secondary | ICD-10-CM | POA: Diagnosis not present

## 2016-05-13 DIAGNOSIS — Z7984 Long term (current) use of oral hypoglycemic drugs: Secondary | ICD-10-CM | POA: Diagnosis not present

## 2016-05-13 DIAGNOSIS — Z79899 Other long term (current) drug therapy: Secondary | ICD-10-CM | POA: Insufficient documentation

## 2016-05-13 DIAGNOSIS — I10 Essential (primary) hypertension: Secondary | ICD-10-CM | POA: Insufficient documentation

## 2016-05-13 DIAGNOSIS — M79609 Pain in unspecified limb: Secondary | ICD-10-CM

## 2016-05-13 HISTORY — DX: Cerebral infarction, unspecified: I63.9

## 2016-05-13 LAB — CBC WITH DIFFERENTIAL/PLATELET
BASOS ABS: 0 10*3/uL (ref 0.0–0.1)
Basophils Relative: 0 %
Eosinophils Absolute: 0.1 10*3/uL (ref 0.0–0.7)
Eosinophils Relative: 1 %
HEMATOCRIT: 34.4 % — AB (ref 36.0–46.0)
HEMOGLOBIN: 12.1 g/dL (ref 12.0–15.0)
LYMPHS PCT: 26 %
Lymphs Abs: 1.9 10*3/uL (ref 0.7–4.0)
MCH: 30.9 pg (ref 26.0–34.0)
MCHC: 35.2 g/dL (ref 30.0–36.0)
MCV: 88 fL (ref 78.0–100.0)
Monocytes Absolute: 0.3 10*3/uL (ref 0.1–1.0)
Monocytes Relative: 4 %
NEUTROS ABS: 4.8 10*3/uL (ref 1.7–7.7)
Neutrophils Relative %: 69 %
PLATELETS: 198 10*3/uL (ref 150–400)
RBC: 3.91 MIL/uL (ref 3.87–5.11)
RDW: 13.5 % (ref 11.5–15.5)
WBC: 7.1 10*3/uL (ref 4.0–10.5)

## 2016-05-13 LAB — URINALYSIS, ROUTINE W REFLEX MICROSCOPIC
BILIRUBIN URINE: NEGATIVE
Glucose, UA: NEGATIVE mg/dL
Ketones, ur: 15 mg/dL — AB
NITRITE: POSITIVE — AB
Protein, ur: NEGATIVE mg/dL
SPECIFIC GRAVITY, URINE: 1.038 — AB (ref 1.005–1.030)
pH: 5 (ref 5.0–8.0)

## 2016-05-13 LAB — BASIC METABOLIC PANEL
ANION GAP: 12 (ref 5–15)
BUN: 26 mg/dL — ABNORMAL HIGH (ref 6–20)
CO2: 20 mmol/L — AB (ref 22–32)
Calcium: 9.9 mg/dL (ref 8.9–10.3)
Chloride: 109 mmol/L (ref 101–111)
Creatinine, Ser: 0.78 mg/dL (ref 0.44–1.00)
GFR calc Af Amer: >60 mL/min (ref >60)
GLUCOSE: 93 mg/dL (ref 65–99)
POTASSIUM: 3.7 mmol/L (ref 3.5–5.1)
Sodium: 141 mmol/L (ref 135–145)

## 2016-05-13 LAB — I-STAT TROPONIN, ED: Troponin i, poc: 0 ng/mL (ref 0.00–0.08)

## 2016-05-13 LAB — URINE MICROSCOPIC-ADD ON

## 2016-05-13 MED ORDER — SODIUM CHLORIDE 0.9 % IV BOLUS (SEPSIS)
1000.0000 mL | Freq: Once | INTRAVENOUS | Status: AC
  Administered 2016-05-13: 1000 mL via INTRAVENOUS

## 2016-05-13 MED ORDER — BACLOFEN 5 MG HALF TABLET
15.0000 mg | ORAL_TABLET | Freq: Once | ORAL | Status: AC
  Administered 2016-05-13: 15 mg via ORAL
  Filled 2016-05-13: qty 3

## 2016-05-13 MED ORDER — PHENAZOPYRIDINE HCL 200 MG PO TABS: 200.0000 mg | ORAL_TABLET | Freq: Three times a day (TID) | ORAL | 0 refills | Status: DC

## 2016-05-13 MED ORDER — IOPAMIDOL (ISOVUE-370) INJECTION 76%
INTRAVENOUS | Status: AC
  Administered 2016-05-13: 100 mL
  Filled 2016-05-13: qty 100

## 2016-05-13 MED ORDER — DEXTROSE 5 % IV SOLN
1.0000 g | Freq: Once | INTRAVENOUS | Status: AC
  Administered 2016-05-13: 1 g via INTRAVENOUS
  Filled 2016-05-13: qty 10

## 2016-05-13 MED ORDER — CETIRIZINE HCL 10 MG PO TABS: 10.0000 mg | ORAL_TABLET | Freq: Every day | ORAL | 1 refills | Status: DC

## 2016-05-13 MED ORDER — CIPROFLOXACIN HCL 500 MG PO TABS: 500.0000 mg | ORAL_TABLET | Freq: Two times a day (BID) | ORAL | 0 refills | Status: DC

## 2016-05-13 MED ORDER — FLUTICASONE PROPIONATE 50 MCG/ACT NA SUSP: 2.0000 | Freq: Every day | NASAL | 2 refills | Status: DC

## 2016-05-13 MED ORDER — DIPHENHYDRAMINE HCL 50 MG/ML IJ SOLN
12.5000 mg | Freq: Once | INTRAMUSCULAR | Status: AC
  Administered 2016-05-13: 12.5 mg via INTRAVENOUS
  Filled 2016-05-13: qty 1

## 2016-05-13 MED ORDER — LORAZEPAM 2 MG/ML IJ SOLN
0.5000 mg | Freq: Once | INTRAMUSCULAR | Status: AC
  Administered 2016-05-13: 0.5 mg via INTRAVENOUS
  Filled 2016-05-13: qty 1

## 2016-05-13 NOTE — ED Notes (Signed)
Pt. Being taken home by family. Alert at baseline. NAD.

## 2016-05-13 NOTE — ED Notes (Signed)
Pt returned from xray

## 2016-05-13 NOTE — ED Provider Notes (Signed)
[facility]ER[facility]ENCY DEPT Provider Note   CSN: [id number] Arrival date & time: 05/13/16  0804     History   Chief Complaint Chief Complaint  Patient presents with  . Shortness of Breath  . Leg Pain    HPI Shelly Mcdaniel is a 60 y.o. female.  The history is provided by medical records.  Shortness of Breath  Associated symptoms include leg pain.  Leg Pain      60 year old female with history of diabetes, hyperlipidemia, hypertension, stroke, presenting to the ED for left leg pain and shortness of breath. History is difficult to obtain as patient is mostly nonverbal, majority of history is provided by patient's husband and her son who is a paramedic.  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 Patient has significant deficits from this including issues with her blood pressure when nearly complete left-sided paralysis. Patient's husband reports early this morning around 1 AM she woke up and was screaming in pain, appeared to be localized to her left calf.  Husband who is an Event organiser massaged her leg and applied a lidocaine patch which seemed to help. Patient does have history of muscle spasms on her left side since her stroke, notably in the left shoulder. Neurologist reports this is likely muscle memory trying to regain function. Lidocaine patch seemed to help the pain, but continues having apparent difficulty breathing. Son reports patient seems to be gasping for air every few seconds or so. States it is almost as if she forgets to breathe. She has not complained of any chest pain or abdominal pain. She's not had any diaphoresis, fever, chills, or cough.  She has had some mild nasal congestion.  She does take daily ASA and plavix.  She does not have known hx of DVT or PE.  Family reports she is at her neurologic baseline.  She does have PT and OT at home, however is immobile majority of the time.  Past Medical History:  Diagnosis Date  . Diabetes  mellitus without complication (HCC)   . Hyperlipidemia   . Hypertension   . Stroke [facility])     Patient Active Problem List   Diagnosis Date Noted  . Orthostatic hypotension 04/08/2016  . Muscle cramps 04/08/2016  . 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 Cerebral thrombosis with cerebral infarction 04/06/2016  . Basilar artery stenosis   . Dysarthria 04/05/2016  . History of CVA with residual deficit 04/05/2016  . Sinus tachycardia 04/05/2016  . Acute encephalopathy 04/05/2016  . Chronic pain 04/05/2016  . Hyperlipidemia 04/05/2016  . Ischemic stroke (HCC)   . 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 TIA (transient ischemic attack) 11/07/2014  . Uncontrolled hypertension 11/07/2014  . DM II (diabetes mellitus, type II), controlled (HCC) 11/07/2014    Past Surgical History:  Procedure Laterality Date  . BACK SURGERY    . BREAST SURGERY    . COLONOSCOPY WITH PROPOFOL N/A 12/24/2013   Procedure: COLONOSCOPY WITH PROPOFOL;  Surgeon: Charolett Bumpers, MD;  Location: [facility] ENDOSCOPY;  Service: Endoscopy;  Laterality: N/A;    OB History    No data available       Home Medications    Prior to Admission medications   Medication Sig Start Date End Date Taking? Authorizing Provider  amLODipine (NORVASC) 5 MG tablet Take 1 tablet (5 mg total) by mouth daily. 04/29/16   Mcarthur Rossetti Angiulli, PA-C  aspirin EC  325 MG EC tablet Take 1 tablet (325 mg total) by mouth daily. 04/09/16   Nishant Dhungel, MD  baclofen (LIORESAL) 10 MG tablet Take 1.5 tablets (15 mg total) by mouth 3 (three) times daily. 04/29/16   Mcarthur Rossetti Angiulli, PA-C  cephALEXin (KEFLEX) 250 MG capsule Take 1 capsule (250 mg total) by mouth every 8 (eight) hours. 04/29/16   Mcarthur Rossetti Angiulli, PA-C  clopidogrel (PLAVIX) 75 MG tablet Take 1 tablet (75 mg total) by mouth daily. 04/29/16   Mcarthur Rossetti Angiulli, PA-C  docusate sodium (COLACE) 100 MG capsule Take 100 mg by  mouth daily.    Historical Provider, MD  famotidine (PEPCID) 20 MG tablet Take 1 tablet (20 mg total) by mouth 2 (two) times daily. 04/29/16   Daniel J Angiulli, PA-C  LEVEMIR FLEXTOUCH 100 UNIT/ML Pen Inject 25 Units into the skin every evening. 04/29/16   Mcarthur Rossetti Angiulli, PA-C  lidocaine (LIDODERM) 5 % Place 3 patches onto the skin daily. Remove & Discard patch within 12 hours or as directed by MD 04/29/16   Mcarthur Rossetti Angiulli, PA-C  lisinopril (PRINIVIL,ZESTRIL) 10 MG tablet Take 1 tablet (10 mg total) by mouth daily. 04/29/16   Mcarthur Rossetti Angiulli, PA-C  Melatonin 3 MG TABS Place 2 tablets (6 mg total) into feeding tube at bedtime. 04/29/16   Mcarthur Rossetti Angiulli, PA-C  metFORMIN (GLUCOPHAGE) 850 MG tablet Take 1 tablet (850 mg total) by mouth 2 (two) times daily with a meal. 04/29/16   Mcarthur Rossetti Angiulli, PA-C  methylphenidate (RITALIN) 5 MG tablet Take 1 tablet (5 mg total) by mouth 2 (two) times daily with breakfast and lunch. 04/29/16   Mcarthur Rossetti Angiulli, PA-C  polyethylene glycol (MIRALAX / GLYCOLAX) packet Take 17 g by mouth daily as needed for moderate constipation.    Historical Provider, MD  potassium chloride (K-DUR,KLOR-CON) 10 MEQ tablet Take 1 tablet (10 mEq total) by mouth 2 (two) times daily. 04/29/16   Mcarthur Rossetti Angiulli, PA-C  rosuvastatin (CRESTOR) 5 MG tablet Take 1 tablet (5 mg total) by mouth at bedtime. 04/29/16   Mcarthur Rossetti Angiulli, PA-C  scopolamine (TRANSDERM-SCOP) 1 MG/3DAYS Place 1 patch (1.5 mg total) onto the skin every 3 (three) days. 04/29/16   Mcarthur Rossetti Angiulli, PA-C  senna (SENOKOT) 8.6 MG tablet Take 1 tablet by mouth daily.    Historical Provider, MD  sertraline (ZOLOFT) 50 MG tablet Take 1 tablet (50 mg total) by mouth at bedtime. 04/29/16   Mcarthur Rossetti Angiulli, PA-C  traMADol (ULTRAM) 50 MG tablet Take 1 tablet (50 mg total) by mouth every 6 (six) hours as needed. 04/29/16   Mcarthur Rossetti Angiulli, PA-C    Family History Family History  Problem Relation Age of Onset  .  Stroke Mother   . Dementia Mother   . Diabetes Father   . Stroke Father   . CAD Father   . Diabetes Sister   . Diabetes Brother   . CAD Brother   . Stroke Brother   . Kidney failure Brother   . Hypertension Other   . Hyperlipidemia Other   . Stroke Other   . Heart disease Other   . Diabetes Other     Social History Social History  Substance Use Topics  . Smoking status: Never Smoker  . Smokeless tobacco: Never Used  . Alcohol use No     Allergies   Bee venom; Codeine; Lipitor [atorvastatin]; and Septra [sulfamethoxazole-trimethoprim]   Review of Systems Review of Systems  Respiratory:  Positive for shortness of breath.   Musculoskeletal: Positive for arthralgias.  All other systems reviewed and are negative.    Physical Exam Updated Vital Signs BP 158/99   Pulse 100   Resp 12   SpO2 99%   Physical Exam  Constitutional: She appears well-developed and well-nourished.  HENT:  Head: Normocephalic and atraumatic.  Mouth/Throat: Oropharynx is clear and moist.  Mildly dry mucous membranes  Eyes: Conjunctivae and EOM are normal. Pupils are equal, round, and reactive to light.  Pupils symmetric and reactive bilaterally  Neck: Normal range of motion.  Cardiovascular: Normal rate, regular rhythm and normal heart sounds.   Pulmonary/Chest: Effort normal and breath sounds normal. She has no wheezes. She has no rhonchi.  Intermittent gasping respirations without accessory muscle use; no tachypnea; no overt wheezes or rhonchi heard; no apparent distress; no episodes of apnea; O2 sats 99% during exam  Abdominal: Soft. Bowel sounds are normal.  Musculoskeletal: Normal range of motion.  Left lower leg is atrophied with some movement and muscle tone of the quad, issues with strength testing; there is no swelling or deformity noted; no overlying erythema or warmth to touch; no palpable cords; lidocaine patch present on left posterior calf; DP pulse intact Right leg overall  appears normal  Neurological: She is alert.  Awake and alert, answers some questions and follows limited commands; intermittently flexing left leg at the knee but left leg and arm appears weak when compared with right; has fairly normal sensation throughout; slight facial droop (family reports this neuro exam is baseline for her)  Skin: Skin is warm and dry.  Psychiatric: She has a normal mood and affect.  Nursing note and vitals reviewed.    ED Treatments / Results  Labs (all labs ordered are listed, but only abnormal results are displayed) Labs Reviewed  CBC WITH DIFFERENTIAL/PLATELET - Abnormal; Notable for the following:       Result Value   HCT 34.4 (*)    All other components within normal limits  BASIC METABOLIC PANEL - Abnormal; Notable for the following:    CO2 20 (*)    BUN 26 (*)    All other components within normal limits  URINALYSIS, ROUTINE W REFLEX MICROSCOPIC (NOT AT [facility]) - Abnormal; Notable for the following:    Specific Gravity, Urine 1.038 (*)    Hgb urine dipstick MODERATE (*)    Ketones, ur 15 (*)    Nitrite POSITIVE (*)    Leukocytes, UA MODERATE (*)    All other components within normal limits  URINE MICROSCOPIC-ADD ON - Abnormal; Notable for the following:    Squamous Epithelial / LPF 0-5 (*)    Bacteria, UA MANY (*)    Casts HYALINE CASTS (*)    All other components within normal limits  URINE CULTURE  I-STAT TROPOININ, ED    EKG  EKG Interpretation  Date/Time:  Friday May 13 2016 08:17:26 EST Ventricular Rate:  103 PR Interval:    QRS Duration: 87 QT Interval:  348 QTC Calculation: 456 R Axis:   74 Text Interpretation:  Sinus tachycardia similar to last EKG  Confirmed by LIU MD, DANA [phone]) on 05/13/2016 8:54:19 AM       Radiology Dg Chest 2 View  Result Date: 05/13/2016 CLINICAL DATA:  Shortness of breath, left calf pain. The patient has been immobile. History of previous CVA, hypertension, diabetes EXAM: CHEST  2 VIEW  COMPARISON:  Chest x-ray of April 05, 2016 FINDINGS: The lungs are adequately inflated  and clear. The heart and pulmonary vascularity are normal. The mediastinum is normal in width. There is no pleural effusion. There is calcification in the wall of the aortic arch. There is mild multilevel degenerative disc disease of the mid and upper thoracic spine. IMPRESSION: There is no active cardiopulmonary disease. Thoracic aortic atherosclerosis. Electronically Signed   By: David  SwazilandJordan M.D.   On: 05/13/2016 09:14   Ct Angio Chest Pe W And/or Wo Contrast  Result Date: 05/13/2016 CLINICAL DATA:  Sudden onset of LEFT leg pain at 1 a.m. with associated shortness of breath. EXAM: CT ANGIOGRAPHY CHEST WITH CONTRAST TECHNIQUE: Multidetector CT imaging of the chest was performed using the standard protocol during bolus administration of intravenous contrast. Multiplanar CT image reconstructions and MIPs were obtained to evaluate the vascular anatomy. CONTRAST:  54 mL Isovue 370. COMPARISON:  None. FINDINGS: Cardiovascular: Satisfactory opacification of the pulmonary arteries to the segmental level. No evidence of pulmonary embolism. Normal heart size. LEFT ventricular hypertrophy. Coronary artery calcification. No pericardial effusion. Mediastinum/Nodes: No enlarged mediastinal, hilar, or axillary lymph nodes. Thyroid gland, trachea, and esophagus demonstrate no significant findings. Lungs/Pleura: Lungs are clear. No pleural effusion or pneumothorax. Upper Abdomen: No acute abnormality. Musculoskeletal: No chest wall abnormality. No acute or significant osseous findings. Review of the MIP images confirms the above findings. IMPRESSION: No evidence for pulmonary emboli or acute infiltrate. Electronically Signed   By: Elsie StainJohn T Curnes M.D.   On: 05/13/2016 10:55   *Preliminary Results* Left lower extremity venous duplex completed. Left lower extremity is negative for deep vein thrombosis. There is no evidence of left  Baker's cyst.  05/13/2016 10:29 AM  Gertie FeyMichelle Simonetti, BS, RVT, RDCS, RDMS     Electronically signed by Lawrence MarseillesMichelle A Simonetti at 05/13/2016 10:29 AM     Procedures Procedures (including critical care time)  Medications Ordered in ED Medications  iopamidol (ISOVUE-370) 76 % injection (100 mLs  Contrast Given 05/13/16 1031)  sodium chloride 0.9 % bolus 1,000 mL (1,000 mLs Intravenous New Bag/Given 05/13/16 1130)     Initial Impression / Assessment and Plan / ED Course  I have reviewed the triage vital signs and the nursing notes.  Pertinent labs & imaging results that were available during my care of the patient were reviewed by me and considered in my medical decision making (see chart for details).  Clinical Course    60 y.o. F here with left leg pain and SOB, onset this morning.  Patient is afebrile, non-toxic.  Left leg is atrophied, no apparent swelling or bony deformities.  No overlying erythema or warmth to touch. Leg is neurovascularly intact. She does have some strength of the quadriceps, less so in the lower leg. Her breathing is somewhat irregular as she has intermittent gasping episodes, however there is no apnea or hypoxia. Patient is largely immobile at baseline, concern for DVT/PE.  Will obtain labs, CXR, CTA of chest to assess for PE.  EKG sinus tachycardia.  Patient's workup is essentially negative up until this point. No evidence of PE or acute infiltrate. Her lab work appears baseline, BUN is slightly elevated which I suspect is from some dehydration. She was given a liter of IV fluids here. Her vitals have remained stable on room air. She is remained without any episodes of apnea or hypoxia here. Venous duplex is also negative, uncertain as to the etiology of her symptoms.  Patient likely can be discharged with close PCP follow-up  11:59 AM Family with concerns of worsening facial droop.  On repeat evaluation, face appears normal as it did during earlier exam.  Family  reports symptoms only lasted about 1 minute or so.  Husband now reports similar episode of this a few days ago as well.  Family is concerned for new stroke, however given transient symptoms feel this is less likely.    Case has been discussed with neurology, Dr. Roxy Mannsster-- agrees that likelihood of new stroke with transient symptoms is low, however if family remains concerned given her large prior strokes, would be reasonable to consider MRI today but if negative, admission not indicated.  After discussion with family, they would prefer MRI to be done here so it has been ordered.  Patient remains stable at this time.  Will also obtain UA as patient has hx of UTI in the past.  UA nitrite + with many bacteria.  Patient recently completed course of keflex for UTI.  Last urine culture in 04/26/16 grew out E. Coli which was sensitive to rocephin, cipro, ancef, bactrim.  Patient has sulfa allergy, so will treat with cipro pending new urine culture.  Given dose of IV Rocephin here.  Awaiting MRI at this time.  4:27 PM Care signed out to PA Pisciotta at shift change.  MRI pending.  Patient somewhat agitated.  She was given benadryl earlier in an attempt to avoid benzo's, however still moving too much to obtain scan.  Will give 0.5mg  ativan.  If MRI is without acute findings, feel patient can be discharged home.  She has previsouly scheduled neurology follow-up on Monday 05/16/16 (3 days from now).  Will start cipro for UTI pending urine culture.    Final Clinical Impressions(s) / ED Diagnoses   Final diagnoses:  Acute cystitis without hematuria  Pain of left lower extremity  Shortness of breath    New Prescriptions New Prescriptions   CETIRIZINE (ZYRTEC ALLERGY) 10 MG TABLET    Take 1 tablet (10 mg total) by mouth daily.   CIPROFLOXACIN (CIPRO) 500 MG TABLET    Take 1 tablet (500 mg total) by mouth 2 (two) times daily.   FLUTICASONE (FLONASE) 50 MCG/ACT NASAL SPRAY    Place 2 sprays into both nostrils  daily.     Garlon HatchetLisa M Chancellor Vanderloop, PA-C 05/13/16 1642    Lavera Guiseana Duo Liu, MD 05/13/16 [phone]

## 2016-05-13 NOTE — Progress Notes (Signed)
*  Preliminary Results* Left lower extremity venous duplex completed. Left lower extremity is negative for deep vein thrombosis. There is no evidence of left Baker's cyst.  05/13/2016 10:29 AM  Gertie FeyMichelle Darrien Laakso, BS, RVT, RDCS, RDMS

## 2016-05-13 NOTE — ED Provider Notes (Signed)
PROGRESS NOTE                                                                                                                 This is a sign-out from PA Sanders at shift change: Shelly Mcdaniel is a 60 y.o. female presenting with shortness of breath and leg pain. Patient's complaints changed over the course of the visit and there was concern that she possibly had a facial droop and weakness that was more accentuated than at her baseline secondary to pontine CVA. Case was discussed with Dr. neurologist, Dr. Leanna Battlessler who recommended obtaining MRI. Significant delay in obtaining MRI based on patient preference and agitation. Plan is to follow-up MRI and disposition with prescription for her urinary tract infection. Please refer to previous note for full HPI, ROS, PMH and PE.   MRI with no acute findings, mild degradation from motion artifact. I have again consulted neurologist Dr. Leanna Battlessler who has evaluated the MRI and is comfortable that none of the above findings are changed from prior. She will be sent home with prescription for Cipro, phenazopyridine given at daughter's request.    Wynetta Emeryicole Cal Gindlesperger, PA-C 05/13/16 1804    Lavera Guiseana Duo Liu, MD 05/13/16 1836

## 2016-05-13 NOTE — ED Notes (Signed)
Facial droop at this time resolved back to baseline.

## 2016-05-13 NOTE — Discharge Instructions (Signed)
Urine still looked infected today.  Start cipro.  Has been sent for culture, we will notify you if results are atypical or antibiotics need to be changed. Follow-up with neurology on Monday as scheduled. Return here for new concerns.

## 2016-05-13 NOTE — ED Notes (Signed)
Pt transporting to MRI.  

## 2016-05-13 NOTE — ED Notes (Signed)
Pt transporting to xray.  

## 2016-05-13 NOTE — ED Notes (Signed)
PA lisa notified for worsening left facial droop and slurred speech compared to baseline on arrival.

## 2016-05-13 NOTE — ED Notes (Signed)
[facility] transported to [facility] 

## 2016-05-13 NOTE — ED Triage Notes (Signed)
Pt to ER by [facility] from home with complaint of sudden onset left leg pain at 1 am with associated shortness of breath. Denies chest pain at this time. Family at bedside reports stroke in August with residual left sided weakness. Pt family reports placing a lidocaine patch to left leg which took the pain away but did not resolve shortness of breath.

## 2016-05-15 LAB — URINE CULTURE: Culture: >=100000 — AB

## 2016-05-16 ENCOUNTER — Telehealth (HOSPITAL_BASED_OUTPATIENT_CLINIC_OR_DEPARTMENT_OTHER): Payer: Self-pay | Admitting: Emergency Medicine

## 2016-05-16 NOTE — Telephone Encounter (Signed)
Post ED Visit - Positive Culture Follow-up  Culture report reviewed by antimicrobial stewardship pharmacist:  []  Enzo BiNathan Batchelder, Pharm.D. []  Celedonio MiyamotoJeremy Frens, [address]Ph[address].D., BCPS []  Garvin FilaMike Maccia, Pharm.D. []  Georgina PillionElizabeth Martin, Pharm.D., BCPS []  RichmondMinh Pham, [place]Pharm.D., BCPS, AAHIVP []  Estella HuskMichelle Turner, Pharm.D., BCPS, AAHIVP []  Cassie Stewart, [address]Ph[address] []  Sherle Poeob Vincent, [place]Pharm.D.   Positive urine culture Treated with cephalexina and cipro, organism sensitive to the same and no further patient follow-up is required at this time.  Berle MullMiller, Bari Handshoe 05/16/2016, 9:21 AM

## 2016-05-23 ENCOUNTER — Encounter: Payer: Self-pay | Admitting: Neurology

## 2016-05-23 ENCOUNTER — Ambulatory Visit (INDEPENDENT_AMBULATORY_CARE_PROVIDER_SITE_OTHER): Payer: BC Managed Care – PPO | Admitting: Neurology

## 2016-05-23 VITALS — BP 123/85 | HR 95 | Ht 68.5 in

## 2016-05-23 DIAGNOSIS — I6930 Unspecified sequelae of cerebral infarction: Secondary | ICD-10-CM | POA: Diagnosis not present

## 2016-05-23 DIAGNOSIS — I951 Orthostatic hypotension: Secondary | ICD-10-CM

## 2016-05-23 DIAGNOSIS — I6330 Cerebral infarction due to thrombosis of unspecified cerebral artery: Secondary | ICD-10-CM | POA: Diagnosis not present

## 2016-05-23 DIAGNOSIS — I651 Occlusion and stenosis of basilar artery: Secondary | ICD-10-CM

## 2016-05-23 DIAGNOSIS — E1121 Type 2 diabetes mellitus with diabetic nephropathy: Secondary | ICD-10-CM | POA: Diagnosis not present

## 2016-05-23 DIAGNOSIS — E784 Other hyperlipidemia: Secondary | ICD-10-CM

## 2016-05-23 DIAGNOSIS — E7849 Other hyperlipidemia: Secondary | ICD-10-CM

## 2016-05-23 MED ORDER — MIDODRINE HCL 5 MG PO TABS: 5.0000 mg | ORAL_TABLET | Freq: Two times a day (BID) | ORAL | 3 refills | Status: DC

## 2016-05-23 MED ORDER — ROSUVASTATIN CALCIUM 20 MG PO TABS: 20.0000 mg | ORAL_TABLET | Freq: Every day | ORAL | 3 refills | Status: DC

## 2016-05-23 NOTE — Patient Instructions (Addendum)
-   continue ASA an plavix for another 6 weeks and then plavix alone - increase crestor from 5mg  to 20mg  for maximized medical treatment - start midodrine low dose 5mg  twice a day, one in the morning and one in the early afternoon, no later than 4pm - if symptoms recurrent or orthostatic hypotension difficulty to manage, we may think about basilar artery stent - Follow up with your primary care physician for stroke risk factor modification. Recommend maintain blood pressure goal <130/80, diabetes with hemoglobin A1c goal below 7.0% and lipids with LDL cholesterol goal below 70 mg/dL.  - check BP and orthostatic vitals at home for monitoring and record - keep hydrated and avoid dehydration  - continue home PT/OT - follow up in 3 months

## 2016-05-24 ENCOUNTER — Telehealth: Payer: Self-pay | Admitting: Neurology

## 2016-05-24 ENCOUNTER — Ambulatory Visit: Payer: BC Managed Care – PPO | Admitting: Physical Therapy

## 2016-05-24 ENCOUNTER — Ambulatory Visit: Payer: BC Managed Care – PPO | Admitting: Speech Pathology

## 2016-05-24 ENCOUNTER — Ambulatory Visit: Payer: BC Managed Care – PPO | Attending: Physical Medicine and Rehabilitation | Admitting: Occupational Therapy

## 2016-05-24 ENCOUNTER — Encounter: Payer: Self-pay | Admitting: Occupational Therapy

## 2016-05-24 VITALS — BP 159/112 | HR 112

## 2016-05-24 DIAGNOSIS — M25512 Pain in left shoulder: Secondary | ICD-10-CM | POA: Insufficient documentation

## 2016-05-24 DIAGNOSIS — R41842 Visuospatial deficit: Secondary | ICD-10-CM | POA: Insufficient documentation

## 2016-05-24 DIAGNOSIS — R2689 Other abnormalities of gait and mobility: Secondary | ICD-10-CM | POA: Insufficient documentation

## 2016-05-24 DIAGNOSIS — I69354 Hemiplegia and hemiparesis following cerebral infarction affecting left non-dominant side: Secondary | ICD-10-CM | POA: Insufficient documentation

## 2016-05-24 DIAGNOSIS — R471 Dysarthria and anarthria: Secondary | ICD-10-CM | POA: Insufficient documentation

## 2016-05-24 DIAGNOSIS — R41841 Cognitive communication deficit: Secondary | ICD-10-CM | POA: Insufficient documentation

## 2016-05-24 DIAGNOSIS — I69315 Cognitive social or emotional deficit following cerebral infarction: Secondary | ICD-10-CM | POA: Insufficient documentation

## 2016-05-24 DIAGNOSIS — R293 Abnormal posture: Secondary | ICD-10-CM | POA: Insufficient documentation

## 2016-05-24 DIAGNOSIS — M79602 Pain in left arm: Secondary | ICD-10-CM | POA: Insufficient documentation

## 2016-05-24 DIAGNOSIS — M6281 Muscle weakness (generalized): Secondary | ICD-10-CM | POA: Insufficient documentation

## 2016-05-24 DIAGNOSIS — R2681 Unsteadiness on feet: Secondary | ICD-10-CM | POA: Insufficient documentation

## 2016-05-24 DIAGNOSIS — R278 Other lack of coordination: Secondary | ICD-10-CM | POA: Insufficient documentation

## 2016-05-24 NOTE — Telephone Encounter (Signed)
Patient's husband said [facility] not [facility] in [facility] - will have it tomorrow. Would like something else prescribed that they can get today. Best call back [phone]

## 2016-05-24 NOTE — Therapy (Signed)
[facility] [address] [place], [place], [address] Phone: [phone]   Fax:  [phone]  Occupational Therapy Evaluation  Patient Details  Name: Shelly Mcdaniel MRN: [id number] Date of Birth: [birth year] Referring Provider: Dr. Tobie LordsLee Shuping  Encounter Date: 05/24/2016    Past Medical History:  Diagnosis Date  . Diabetes mellitus without complication (HCC)   . Hyperlipidemia   . Hypertension   . Stroke [facility](HCC)     Past Surgical History:  Procedure Laterality Date  . BACK SURGERY    . BREAST SURGERY    . COLONOSCOPY WITH PROPOFOL N/A 12/24/2013   Procedure: COLONOSCOPY WITH PROPOFOL;  Surgeon: Charolett BumpersMartin K Johnson, MD;  Location: [facility];  Service: Endoscopy;  Laterality: N/A;    Vitals:   05/24/16 0943  BP: (!) 159/112  Pulse: (!) 112        Subjective Assessment - 05/24/16 0941    Patient is accompained by: Family member  husband   Limitations vestibular deficits (central and peripheral deficits), fall risk, Lt AFO, sh. subluxation   Patient Stated Goals I want my hand to come back   Currently in Pain? Yes   Pain Location Arm   Pain Orientation Left   Pain Descriptors / Indicators Aching   Pain Type Chronic pain   Pain Onset More than a month ago   Pain Frequency Intermittent   Aggravating Factors  with movement   Pain Relieving Factors repositioning, botox helped        Pt arrived today for OT and PT evaluation and treatment.  BP at beginning of session before any activity was 160/112 with resting HR of 110.  After 5 minutes BP was 152/106, resting HR 110.  Then took manual BP which was 152/106, resting HR 110.  Explained to pt and husband that we are unable to proceed given diastolic BP and resting HR.  Husband initially reported "her BP is always high its fine". Explained we are not allowed to treat with diastolic above 100 because given the nature of what we do in therapy we would like elevate  her BP.  Pt then stated "she was supposed to start a new BP med the dr knows it is too high and the pharmacy doesn't have it."  Husband went next door to MD office to see if he could obtain sample meds and understands that BP will need to be better controlled for pt to participate safely in therapy.  Husband stated "I know it is too high and it certainly isn't worth risking it but she needs therapy too."  Explained that we are happy to work with MD and pt to monitor and work with pt as soon as possible.  Husband to reschedule OT and PT evals.  Pt and husband both able to verbalize understanding.  BP/HR sent to MD for review as well.  Session terminated.                    Visit Diagnosis: Hemiplegia and hemiparesis following cerebral infarction affecting left non-dominant side [facility](HCC)    Problem List Patient Active Problem List   Diagnosis Date Noted  . Orthostatic hypotension 04/08/2016  . Muscle cramps 04/08/2016  . 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 Cerebral thrombosis with cerebral infarction 04/06/2016  . Basilar artery stenosis   . Dysarthria 04/05/2016  . History of CVA with residual deficit 04/05/2016  . Sinus tachycardia  04/05/2016  . Acute encephalopathy 04/05/2016  . Chronic pain 04/05/2016  . Hyperlipidemia 04/05/2016  . Ischemic stroke (HCC)   . 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 TIA (transient ischemic attack) 11/07/2014  . Uncontrolled hypertension 11/07/2014  . DM II (diabetes mellitus, type II), controlled (HCC) 11/07/2014    Norton PastelPulaski, Bryley Kovacevic Halliday, OTR/L 05/24/2016, 10:02 AM   [facility] [address] [place], [place], [address] Phone: [phone]   Fax:  [phone]  Name: Shelly Mcdaniel MRN: [id number] Date of Birth: 07/11/1955

## 2016-05-24 NOTE — Progress Notes (Signed)
STROKE NEUROLOGY FOLLOW UP NOTE  NAME: Shelly Mcdaniel DOB: 03/13/1956  REASON FOR VISIT: stroke follow up HISTORY FROM: son and chart  Today we had the pleasure of seeing Shelly Mcdaniel in follow-up at our Neurology Clinic. Pt was accompanied by son.   History Summary Shelly Mcdaniel is a 60 y.o. 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 she had right pontine stroke in 01/2016 and was [facility]. TEE and CUS unremarkable and she was discharged with ASA and zocor. She continued to have left hemiparesis. After admission, stroke work up showed again right pontine and b/l cerebellar peduncle infarcts. 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 EEG generalized slowing and no seizure. LDL 110, A1C 6.7. She was put on DAPT and crestor. She was also found to have orthostatic hypotension. Put on TED hose and BP goal 130-150. She was later discharged to CIR.   Interval History During the interval time, the patient has been doing the same. She is now home. She was initially improving on the left hemiparesis but recently developed continued UTI even with antibiotics. As per son, all her improvement has been disappeared due to UTI. Her BP at home lying about 150-170, sitting 130s and standing would drop to 100s. Today in clinic BP 123/85  REVIEW OF SYSTEMS: Full 14 system review of systems performed and notable only for those listed below and in HPI above, all others are negative:  Constitutional:   Cardiovascular:  Ear/Nose/Throat:  Trouble swallowing Skin:  Eyes:   Respiratory:   Gastroitestinal:  Nausea  Genitourinary: incontinence of bladder Hematology/Lymphatic:   Endocrine:  Musculoskeletal:   Allergy/Immunology:  Frequent infections Neurological:   Dizziness, speech difficulty, weakness, facial  drooping Psychiatric:  Sleep: acting out dreams  The following represents the patient's updated allergies and side effects list: Allergies  Allergen Reactions  . Bee Venom Anaphylaxis  . Codeine Itching  . Lipitor [Atorvastatin] Other (See Comments)    Cramping   . Septra [Sulfamethoxazole-Trimethoprim]     Mother died from Stevens-Johnson syndrome and sister had anaphylaxis from Septra.Physician told her never to take this medication.    The neurologically relevant items on the patient's problem list were reviewed on today's visit.  Neurologic Examination  A problem focused neurological exam (12 or more points of the single system neurologic examination, vital signs counts as 1 point, cranial nerves count for 8 points) was performed.  Blood pressure 123/85, pulse 95, height 5' 8.5" (1.74 m).  General - Well nourished, well developed, in no apparent distress.  Ophthalmologic - Fundi not visualized due to eye movement.  Cardiovascular - Regular rate and rhythm.  Mental Status -  Level of arousal and orientation to month, place and person were intact, not orientated to year. Language exam showed mild wording finding difficulty and slow speech, mild dysarthria, but intact naming, repetition, comprehension.  Cranial Nerves II - XII - II - Visual field intact OU. III, IV, VI - Extraocular movements intact. V - Facial sensation intact bilaterally. VII - left facial droop. VIII - Hearing & vestibular intact bilaterally. X - Palate elevates symmetrically, mild dysarthria. XI - Chin turning & shoulder shrug intact bilaterally. XII - Tongue protrusion intact.  Motor Strength - The patient's strength was 5/5 RUE and RLE, but 0/5 LUE and  2/5 LLE proximal but 0/5 DF and PF. Bulk was normal and fasciculations were absent.   Motor Tone - Muscle tone was assessed at the neck and appendages and was increased LUE.  Reflexes - The patient's reflexes were 1+ in all extremities and  she had no pathological reflexes.  Sensory - Light touch, temperature/pinprick were assessed and were symmetrical.    Coordination - The patient had normal movements in the right hand with no ataxia or dysmetria.  Tremor was absent.  Gait and Station - in wheelchair, not tested.   Functional score  mRS = 4   0 - No symptoms.   1 - No significant disability. Able to carry out all usual activities, despite some symptoms.   2 - Slight disability. Able to look after own affairs without assistance, but unable to carry out all previous activities.   3 - Moderate disability. Requires some help, but able to walk unassisted.   4 - Moderately severe disability. Unable to attend to own bodily needs without assistance, and unable to walk unassisted.   5 - Severe disability. Requires constant nursing care and attention, bedridden, incontinent.   6 - Dead.   NIH Stroke Scale   Level Of Consciousness 0=Alert; keenly responsive 1=Not alert, but arousable by minor stimulation 2=Not alert, requires repeated stimulation 3=Responds only with reflex movements 0  LOC Questions to Month and Age 8=Answers both questions correctly 1=Answers one question correctly 2=Answers neither question correctly 0  LOC Commands      -Open/Close eyes     -Open/close grip 0=Performs both tasks correctly 1=Performs one task correctly 2=Performs neighter task correctly 0  Best Gaze 0=Normal 1=Partial gaze palsy 2=Forced deviation, or total gaze paresis 0  Visual 0=No visual loss 1=Partial hemianopia 2=Complete hemianopia 3=Bilateral hemianopia (blind including cortical blindness) 0  Facial Palsy 0=Normal symmetrical movement 1=Minor paralysis (asymmetry) 2=Partial paralysis (lower face) 3=Complete paralysis (upper and lower face) 2  Motor  0=No drift, limb holds posture for full 10 seconds 1=Drift, limb holds posture, no drift to bed 2=Some antigravity effort, cannot maintain posture, drifts to  bed 3=No effort against gravity, limb falls 4=No movement Right Arm 0     Leg 1    Left Arm 3     Leg 3  Limb Ataxia 0=Absent 1=Present in one limb 2=Present in two limbs 0  Sensory 0=Normal 1=Mild to moderate sensory loss 2=Severe to total sensory loss 0  Best Language 0=No aphasia, normal 1=Mild to moderate aphasia 2=Mute, global aphasia 3=Mute, global aphasia 0  Dysarthria 0=Normal 1=Mild to moderate 2=Severe, unintelligible or mute/anarthric 1  Extinction/Neglect 0=No abnormality 1=Extinction to bilateral simultaneous stimulation 2=Profound neglect 0  Total   10     Data reviewed: I personally reviewed the images and agree with the radiology interpretations.  Dg Chest 2 View 04/05/2016 No radiographic evidence of acute cardiopulmonary disease.   Ct Head Wo Contrast 04/05/2016 1. No acute intracranial findings.  2. 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    Mr Brain 44 Contrast Mr Maxine Glenn Head Wo Contrast 04/05/2016 1. Foci of diffusion restriction within the right hemipons and abnormal signal in the middle cerebellar peduncles probably represent areas of acute/early subacute ischemia. No hemorrhage identified.  2. 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  3. Otherwise the circle of Willis is patent with atherosclerotic changes and short segments of mild stenosis in the anterior and posterior circulation without large vessel occlusion or aneurysm.  4. Paranasal sinus disease predominantly in  anterior ethmoid and right frontal sinuses.  5. Stable moderate chronic microvascular ischemic changes of the brain parenchyma.   EEG This is an abnormal EEG due to the presence of mild diffuse generalized slowing with greater intermixed bifrontal slowing. This pattern is nonspecific and consistent with a global encephalopathic process, nonspecific as to etiology. There is no evidence of seizure on the study.  CUS 01/17/2016 -  bilateral plaques, but no hemodynamically significant stenosis  TTE - 01/17/16 - mild diastolic dysfunction with borderline left ventricular hypertrophy with a trace amount of mital regurgitation, trace amount of aortic regurgitation, trace amount of tricuspid regurgitation.  MRI brain 01/17/16 - atrophy and chronic micro-vascular ischemic changes. There is restricted diffusion on the right side of the pons consistent with an acute infarct.   Component     Latest Ref Rng & Units 04/05/2016 04/07/2016  Cholesterol     0 - 200 mg/dL  409164  Triglycerides     <150 mg/dL  811125  HDL Cholesterol     >40 mg/dL  29 (L)  Total CHOL/HDL Ratio     RATIO  5.7  VLDL     0 - 40 mg/dL  25  LDL (calc)     0 - 99 mg/dL  914110 (H)  Hemoglobin N8GA1C     4.8 - 5.6 % 6.7 (H)   Mean Plasma Glucose     mg/dL 956146   Ammonia     9 - 35 umol/L 14   Vitamin B12     180 - 914 pg/mL  320  Folate     >5.9 ng/mL  21.7    Assessment: As you may recall, she is a 60 y.o. 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 TTE and CUS in 01/2016 unremarkable, EEG generalized slowing and no seizure. LDL 110, A1C 6.7. She was put on DAPT and crestor. She was also found to have orthostatic hypotension. Put on TED hose and BP goal 130-150. She was later discharged to CIR and then home. She was initially improving on the left hemiparesis but recently worsened after developed continued UTI even with antibiotics. Still has orthostatic hypotension at home and pt does not like compression socks.   Plan:  - continue ASA an plavix for another 6 weeks and then plavix alone - increase crestor from 5mg  to 20mg  for maximized medical treatment - start midodrine low dose 5mg  twice a day, one in the morning  and one in the early afternoon, no later than 4pm - if symptoms recurrent or orthostatic hypotension difficulty to manage, we may think about basilar artery stent - Follow up with your primary care physician for stroke risk factor modification. Recommend maintain blood pressure goal <130/80, diabetes with hemoglobin A1c goal below 7.0% and lipids with LDL cholesterol goal below 70 mg/dL.  - check BP and orthostatic vitals at home for monitoring and record - keep hydrated and avoid dehydration  - continue home PT/OT - follow up in 3 months  I spent more than 25 minutes of face to face time with the patient. Greater than 50% of time was spent in counseling and coordination of care. We discussed BP management, medication change and potential BA stent if symptoms recurrent.   No orders of  the defined types were placed in this encounter.   Meds ordered this encounter  Medications  . midodrine (PROAMATINE) 5 MG tablet    Sig: Take 1 tablet (5 mg total) by mouth 2 (two) times daily with a meal. One in the morning and one in the early afternoon and no later than 4pm    Dispense:  60 tablet    Refill:  3  . rosuvastatin (CRESTOR) 20 MG tablet    Sig: Take 1 tablet (20 mg total) by mouth at bedtime.    Dispense:  30 tablet    Refill:  3    Patient Instructions  - continue ASA an plavix for another 6 weeks and then plavix alone - increase crestor from 5mg  to 20mg  for maximized medical treatment - start midodrine low dose 5mg  twice a day, one in the morning and one in the early afternoon, no later than 4pm - if symptoms recurrent or orthostatic hypotension difficulty to manage, we may think about basilar artery stent - Follow up with your primary care physician for stroke risk factor modification. Recommend maintain blood pressure goal <130/80, diabetes with hemoglobin A1c goal below 7.0% and lipids with LDL cholesterol goal below 70 mg/dL.  - check BP and orthostatic vitals at home for  monitoring and record - keep hydrated and avoid dehydration  - continue home PT/OT - follow up in 3 months   Marvel Plan, MD PhD [facility] [address] [place], [place] [address] [phone]

## 2016-05-24 NOTE — Telephone Encounter (Signed)
Rn spoke with patients husband about the midodrine medication. Rn stated per Dr.Xu he does not want to prescribed anything else. Also its not urgent if she does not have the prescription today. Pts husband stated he has been communicating with Dr.Hal Stoneking about his wife blood pressure. He states Shelly Mcdaniel will have the medication tomorrow and he will pick up.

## 2016-05-27 ENCOUNTER — Ambulatory Visit: Payer: BC Managed Care – PPO

## 2016-05-27 ENCOUNTER — Ambulatory Visit: Payer: BC Managed Care – PPO | Admitting: Rehabilitation

## 2016-05-30 ENCOUNTER — Telehealth: Payer: Self-pay

## 2016-05-30 ENCOUNTER — Ambulatory Visit: Payer: BC Managed Care – PPO | Admitting: Occupational Therapy

## 2016-05-30 ENCOUNTER — Other Ambulatory Visit: Payer: Self-pay

## 2016-05-30 DIAGNOSIS — M79602 Pain in left arm: Secondary | ICD-10-CM | POA: Diagnosis present

## 2016-05-30 DIAGNOSIS — I69315 Cognitive social or emotional deficit following cerebral infarction: Secondary | ICD-10-CM

## 2016-05-30 DIAGNOSIS — I69354 Hemiplegia and hemiparesis following cerebral infarction affecting left non-dominant side: Secondary | ICD-10-CM | POA: Diagnosis present

## 2016-05-30 DIAGNOSIS — R41842 Visuospatial deficit: Secondary | ICD-10-CM

## 2016-05-30 DIAGNOSIS — R2681 Unsteadiness on feet: Secondary | ICD-10-CM

## 2016-05-30 DIAGNOSIS — M25512 Pain in left shoulder: Secondary | ICD-10-CM | POA: Diagnosis present

## 2016-05-30 DIAGNOSIS — R278 Other lack of coordination: Secondary | ICD-10-CM | POA: Diagnosis present

## 2016-05-30 DIAGNOSIS — I6330 Cerebral infarction due to thrombosis of unspecified cerebral artery: Secondary | ICD-10-CM

## 2016-05-30 DIAGNOSIS — R471 Dysarthria and anarthria: Secondary | ICD-10-CM | POA: Diagnosis present

## 2016-05-30 DIAGNOSIS — M6281 Muscle weakness (generalized): Secondary | ICD-10-CM | POA: Diagnosis present

## 2016-05-30 DIAGNOSIS — R2689 Other abnormalities of gait and mobility: Secondary | ICD-10-CM

## 2016-05-30 DIAGNOSIS — R293 Abnormal posture: Secondary | ICD-10-CM

## 2016-05-30 DIAGNOSIS — R41841 Cognitive communication deficit: Secondary | ICD-10-CM | POA: Diagnosis present

## 2016-05-30 MED ORDER — ROSUVASTATIN CALCIUM 20 MG PO TABS: 20.0000 mg | ORAL_TABLET | Freq: Every day | ORAL | 3 refills | Status: DC

## 2016-05-30 NOTE — Telephone Encounter (Signed)
FMLA paperwork given to medical records. Pts son Gentry RochWeston Parson needs it for his job. Pts son needs to pay 50.00 for paperwork.

## 2016-05-30 NOTE — Telephone Encounter (Signed)
Pt form ready for pick up. 

## 2016-05-31 NOTE — Therapy (Signed)
[facility] [address] [place], [place], [address] Phone: [phone]   Fax:  [phone]  Occupational Therapy Evaluation  Patient Details  Name: Shelly Mcdaniel MRN: [id number] Date of Birth: 02/22/1956 Referring Provider: Dr. Claudette Laws  Encounter Date: 05/30/2016      OT End of Session - 05/30/16 1931    Visit Number 1   Number of Visits 25   Date for OT Re-Evaluation 07/30/16   Authorization Type BC/BS   OT Start Time 1535   OT Stop Time 1620   OT Time Calculation (min) 45 min   Activity Tolerance Patient tolerated treatment well   Behavior During Therapy [facility] for tasks assessed/performed      Past Medical History:  Diagnosis Date  . Diabetes mellitus without complication (HCC)   . Hyperlipidemia   . Hypertension   . Stroke [facility])     Past Surgical History:  Procedure Laterality Date  . BACK SURGERY    . BREAST SURGERY    . COLONOSCOPY WITH PROPOFOL N/A 12/24/2013   Procedure: COLONOSCOPY WITH PROPOFOL;  Surgeon: Charolett Bumpers, MD;  Location: [facility];  Service: Endoscopy;  Laterality: N/A;    There were no vitals filed for this visit.      Subjective Assessment - 05/31/16 0831    Subjective  Pt s/p CVA in August 2017, returns to therapy s/p new CVA 04/05/16, pt received therapies at CIR and HHOT. Pt recently had a UTI which set back her progress.   Patient is accompained by: Family member   Pertinent History see Epic   Limitations elevated BP and orthostatic hypotension at times, monitor BP   Patient Stated Goals to be more indpendent   Currently in Pain? Yes   Pain Score 6    Pain Location Arm   Pain Orientation Left   Pain Descriptors / Indicators Aching   Pain Type Chronic pain   Pain Onset More than a month ago   Pain Frequency Intermittent   Aggravating Factors  movment   Pain Relieving Factors repositioning   Multiple Pain Sites No           [facility] OT Assessment -  05/30/16 1543      Assessment   Diagnosis CVA   Referring Provider Dr. Claudette Laws   Onset Date 04/05/16   Prior Therapy CIR     Precautions   Precautions Fall   Required Braces or Orthoses --  has resting hand splint, does not wear consistently   Other Brace/Splint L AFO     Balance Screen   Has the patient fallen in the past 6 months No   Has the patient had a decrease in activity level because of a fear of falling?  No   Is the patient reluctant to leave their home because of a fear of falling?  No     Home  Environment   Family/patient expects to be discharged to: Private residence   Living Arrangements Spouse/significant other   Home Access Ramped entrance   Home Layout Two level  lives on the main floor   Bathroom Shower/Tub Tub/Shower unit;Curtain   Shower/tub characteristics --  tub shower bench   Home Equipment Bedside commode;Tub bench   Additional Comments Pt lives in 2 story home, but lives on 1st floor. Pt has ramp to enter. 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   Leisure beach, play with dogs, cook, cross stich     ADL   Eating/Feeding Needs assist with cutting food  set up   Grooming Minimal assistance   Upper Body Bathing Moderate assistance   Lower Body Bathing mod-max A   Upper Body Dressing Moderate assistance  for shirt, does not wear bra   Lower Body Dressing Maximal assistance   Toilet Tranfer Moderate assistance   Toileting -  Hygiene Moderate assistance  if she has bowel movement   Tub/Shower Transfer Moderate assistance   ADL comments dependent for IADLS including financial management     IADL   Light Housekeeping Does not participate in any housekeeping tasks   Meal Prep Needs to have meals prepared and served   Medication Management --  family prepares pillbox and keeps  schedule     Mobility   Mobility Status Needs assist   Mobility Status Comments uses w/c primarily     Written Expression   Dominant Hand Right   Handwriting 75% legible  Pt did not have her reading glasses     Vision - History   Baseline Vision Wears glasses only for reading   Additional Comments 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     Vision Assessment   Eye Alignment Impaired (comment)   Visual Fields No apparent deficits   Depth Perception no depth perception (pre-morbid) secondary to strabismus Lt eye     Activity Tolerance   Activity Tolerance --  impaired per family report     Cognition   Overall Cognitive Status Impaired/Different from baseline  to be further assessed in a functional context   Area of Impairment Memory;Attention   Current Attention Level Selective   Memory Decreased short-term memory   Attention Selective   Selective Attention Impaired   Memory Impaired   Memory Impairment Decreased short term memory     Sensation   Light Touch --  mild impairment LUE per pt report     Coordination   Gross Motor Movements are Fluid and Coordinated No   Fine Motor Movements are Fluid and Coordinated No     Tone   Assessment Location Left Upper Extremity     ROM / Strength   AROM / PROM / Strength PROM     AROM   Overall AROM Comments trace finger extension noted     PROM   PROM Assessment Site Shoulder;Elbow;Forearm;Wrist   Right/Left Shoulder Left   Left Shoulder Flexion 45 Degrees   Left Shoulder ABduction 60 Degrees  scaption   Right/Left Elbow Left   Left Elbow Flexion 110   Left Elbow Extension --  approx -10-20*   Right/Left Forearm Left   Left Forearm Supination --  supination limited to grossly 60% due to pain     LUE Tone   LUE Tone Hypertonic  Pt s/p botox during recent hospitalization     LUE Tone   Hypertonic Details left forearm,   Hypotonic Details At Lt shoulder with subluxation  2 digits width inferior subluxation                            OT Short Term Goals - 05/31/16 0810      OT SHORT TERM GOAL #1   Title Pt/family independent with initial HEP for LUE (due 06/29/16)   Time 4   Period Weeks   Status New  OT SHORT TERM GOAL #2   Title Pt/family to verbalize understanding with proper positioning of LUE during activity and in bed to prevent pain including use of sling /splint prn   Time 4   Period Weeks   Status New     OT SHORT TERM GOAL #3   Title Pt to perform UB dressing with min assist using hemi techniques   Time 4   Period Weeks   Status New     OT SHORT TERM GOAL #4   Title Pt will donn pants with mod A.   Time 4   Period Weeks   Status New     OT SHORT TERM GOAL #5   Title Pt will use LUE as a stabilizer with min A 25 % of the time for ADLS/ functional activity   Time 4   Period Weeks   Status New     Additional Short Term Goals   Additional Short Term Goals Yes     OT SHORT TERM GOAL #6   Title Pt will perform bathing with min A for UB and mod A for LB   Time 4   Period Weeks   Status New           OT Long Term Goals - 05/31/16 0813      OT LONG TERM GOAL #1   Title Independent with updated HEP prn (all LTG's due 07/30/15)   Time 8   Period Weeks   Status New     OT LONG TERM GOAL #2   Title Pt to demo active low range sh. flexion to 25 degrees in prep for reaching and use of arm as stabilizer   Time 8   Period Weeks   Status New     OT LONG TERM GOAL #3   Title Pt to demo 25% gross finger flexion/extension to grasp/release cylindrical objects   Time 8   Period Weeks   Status New     OT LONG TERM GOAL #4   Title Pt will use LUE as a stabilizer for ADLs with min v.c. and pain less than or equal to 3/10   Time 8   Period Weeks   Status New     OT LONG TERM GOAL #5   Title Pt will perfrom toilet and shower transfers with min A.   Time 8   Period Weeks   Status New               Plan - 05/30/16 1930    Clinical  Impression Statement Pt s/p CVA in August 2017 with hx of HTN, HLD, DM was hospitalized 04/05/16 with right pontine and bilateral peduncle infarcts. Pt received therapies at CIR and botox 04/22/16 to LUE then was d/c home with HHOT. Pt presents with left hemiplegia, decreased balance, cognitive deficits, and pain which impedes performance of ADLs/IADLS. Pt can benefit from skilled occupational therapy to maximize safety and independence with ADLs/IADLs and to maintain quality of life.   Rehab Potential Fair   OT Frequency 3x / week  plan to start at 2x week then increase to 3x week as pt demonstrates improved endurance and progress.   OT Duration 8 weeks   OT Treatment/Interventions Self-care/ADL training;Moist Heat;DME and/or AE instruction;Splinting;Patient/family education;Therapeutic exercises;Compression bandaging;Therapeutic activities;Neuromuscular education;Functional Mobility Training;Passive range of motion;Cognitive remediation/compensation;Electrical Stimulation;Manual Therapy;Dry needling;Visual/perceptual remediation/compensation   Plan monitor BP, LUE NMR   Consulted and Agree with Plan of Care Patient;Family member/caregiver   Family Member Consulted Husband -  [name], dtr      Patient will benefit from skilled therapeutic intervention in order to improve the following deficits and impairments:  Decreased activity tolerance, Decreased balance, Decreased cognition, Decreased knowledge of use of DME, Decreased coordination, Decreased endurance, Decreased range of motion, Decreased safety awareness, Decreased skin integrity, Decreased strength, Impaired perceived functional ability, Increased edema, Impaired sensation, Impaired tone, Impaired UE functional use, Impaired vision/preception, Pain, Improper body mechanics, Decreased mobility, Abnormal gait, Difficulty walking, Decreased knowledge of precautions  Visit Diagnosis: Hemiplegia and hemiparesis following cerebral infarction affecting  left non-dominant side (HCC) - Plan: Ot plan of care cert/re-cert  Unsteadiness on feet - Plan: Ot plan of care cert/re-cert  Abnormal posture - Plan: Ot plan of care cert/re-cert  Acute pain of left shoulder - Plan: Ot plan of care cert/re-cert  Other lack of coordination - Plan: Ot plan of care cert/re-cert  Visuospatial deficit - Plan: Ot plan of care cert/re-cert  Pain in left arm - Plan: Ot plan of care cert/re-cert  Muscle weakness (generalized) - Plan: Ot plan of care cert/re-cert  Other abnormalities of gait and mobility - Plan: Ot plan of care cert/re-cert  Cognitive social or emotional deficit following cerebral infarction - Plan: Ot plan of care cert/re-cert    Problem List Patient Active Problem List   Diagnosis Date Noted  . Orthostatic hypotension 04/08/2016  . Muscle cramps 04/08/2016  . 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 Cerebral thrombosis with cerebral infarction 04/06/2016  . Basilar artery stenosis   . Dysarthria 04/05/2016  . History of CVA with residual deficit 04/05/2016  . Sinus tachycardia 04/05/2016  . Acute encephalopathy 04/05/2016  . Chronic pain 04/05/2016  . Hyperlipidemia 04/05/2016  . Ischemic stroke (HCC)   . 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 TIA (transient ischemic attack) 11/07/2014  . Uncontrolled hypertension 11/07/2014  . DM II (diabetes mellitus, type II), controlled (HCC) 11/07/2014    RINE,KATHRYN 05/31/2016, 8:45 AM Keene BreathKathryn Rine, OTR/L [phone] Phone: [phone] 8:45 AM 12/19/17[facility] [address] [place], [place], [address] Phone: [phone]   Fax:  [phone]  Name: Shelly Mcdaniel MRN: [id number] Date of Birth: 15-Nov-1955

## 2016-06-01 ENCOUNTER — Ambulatory Visit: Payer: BC Managed Care – PPO

## 2016-06-01 DIAGNOSIS — R41841 Cognitive communication deficit: Secondary | ICD-10-CM

## 2016-06-01 DIAGNOSIS — R471 Dysarthria and anarthria: Secondary | ICD-10-CM

## 2016-06-01 DIAGNOSIS — I69354 Hemiplegia and hemiparesis following cerebral infarction affecting left non-dominant side: Secondary | ICD-10-CM | POA: Diagnosis not present

## 2016-06-01 NOTE — Patient Instructions (Signed)
Sorting laundry (Crissa's/not Miia's), then specific items (socks) would be helpful to improve attention Playing simple games (War, Connect Four, Solitaire) would also be helpful for attention

## 2016-06-01 NOTE — Therapy (Signed)
[facility] [address] [place], [place], [address] Phone: [phone]   Fax:  [phone]  Speech Language Pathology Evaluation  Patient Details  Name: Shelly Mcdaniel MRN: [id number] Date of Birth: 05-Apr-1956 Referring Provider: Claudette Laws, MD  Encounter Date: 06/01/2016      End of Session - 06/01/16 1227    Visit Number 1   Number of Visits 17   Date for SLP Re-Evaluation 08/19/16   SLP Start Time 1105  arrived late   SLP Stop Time  1150   SLP Time Calculation (min) 45 min   Activity Tolerance --  limited by decr'd attention      Past Medical History:  Diagnosis Date  . Diabetes mellitus without complication (HCC)   . Hyperlipidemia   . Hypertension   . Stroke [facility]     Past Surgical History:  Procedure Laterality Date  . BACK SURGERY    . BREAST SURGERY    . COLONOSCOPY WITH PROPOFOL N/A 12/24/2013   Procedure: COLONOSCOPY WITH PROPOFOL;  Surgeon: Charolett Bumpers, MD;  Location: [facility];  Service: Endoscopy;  Laterality: N/A;    There were no vitals filed for this visit.      Subjective Assessment - 06/01/16 1115    Subjective Per husband, pt's UTI reportedly stopped being an issue, cognitively, approx one week ago.   Patient is accompained by: Family member  husband            SLP Evaluation [facility] - 06/01/16 1117      SLP Visit Information   SLP Received On 06/01/16   Referring Provider Claudette Laws, MD   Onset Date October 2017   Medical Diagnosis CVA     Subjective   Subjective "She's really come more into herself the last 2-3 days." (husband)     Pain Assessment   Currently in Pain? No/denies     General Information   HPI Pt with CVA in August 2017, was seen for a short course of ST at this facility for voice and then d/c'd with plan to go to [facility]. Prior to that appointment in late October pt had another CVA. Pt had ST on acute, CIR, and HHST.     Prior  Functional Status   Cognitive/Linguistic Baseline Within functional limits   Type of Home House    Lives With Spouse     Cognition   Overall Cognitive Status Impaired/Different from baseline   Area of Impairment Attention;Awareness   Current Attention Level Sustained   Attention Comments Pt demo'd decr'd sustained attention with clock drawing and trail making, perseverating on checkmarking all items with trail making while yet able to repeat directions to SLP correctly. Pt also incorrectly placing numbers in clock face after marking (only) the "12", "3", and "6" positions with much extra time allowed.    Awareness Comments Splinter skills, "Boy, why is this (trailmaking) so hard for me?", while limited awareness of errors with clock or spelling "calmer" backwards.    Awareness Impaired   Awareness Impairment Emergent impairment   Problem Solving Impaired   Problem Solving Impairment Functional basic;Verbal basic  unsure if problem solving vs. decr'd attention?   Behaviors Perseveration  perseverated with check marks      Auditory Comprehension   Overall Auditory Comprehension Appears within functional limits for tasks assessed     Verbal Expression   Overall Verbal Expression Appears within functional limits for tasks assessed     Motor Speech  Overall Motor Speech Impaired  Decr'd breath support   Respiration Impaired   Level of Impairment Phrase     Standardized Assessments   Standardized Assessments  Montreal Cognitive Assessment [facility](MOCA)  unable to complete fully due to time constraints                         SLP Education - 06/01/16 1227    Education provided Yes   Education Details tasks to improve attention   Person(s) Educated Patient;Spouse   Methods Explanation   Comprehension Verbalized understanding;Need further instruction          SLP Short Term Goals - 06/01/16 1232      SLP SHORT TERM GOAL #1   Title pt will demo sustained/selective  attention in NOVEL therapy tasks in a min-mod noisy environment for 10 minutes, with rare min A back to task   Time 4   Period Weeks   Status New     SLP SHORT TERM GOAL #2   Title pt will demo awareness of errors in novel therapy tasks, 60% of the time, over three sessions   Time 4   Period Weeks   Status New     SLP SHORT TERM GOAL #3   Title pt will demo exercises for incr'd breath support with occasional min A over three sessions   Time 4   Period Weeks   Status New          SLP Long Term Goals - 06/01/16 1236      SLP LONG TERM GOAL #1   Title pt will demo alternating attention with simple therapy tasks with rare min A needed for redirection/finding place back within the task   Time 8   Period Weeks   Status New     SLP LONG TERM GOAL #2   Title pt will exhibit emergent awareness in simple to mod complex NOVEL cognitive linguistic tasks 90% of the time over three sessions   Time 8   Period Weeks   Status New     SLP LONG TERM GOAL #3   Title pt will deom WNL breath support for multiple sentence responses in 10 minutes conversation over three sessions    Time 8   Period Weeks   Status New          Plan - 06/01/16 1228    Clinical Impression Statement Pt presents today with deficits in cognitive-linguistics specifically attention skills and awareness. SLP's opinion is that these deficits affect other areas of cognitive linguistics such as problem solving, reasoning, executive function, and memory. Pt also demonstrated decr'd breath support resulting in low speech volume at the phrase level. She would beneift from skilled ST focusing on cognitive linguistics and dysarthria to decr caregiver burden and improve overall communication skills with family and community members.   Speech Therapy Frequency 2x / week   Duration --  8 weeks, or 16 therapy visits   Treatment/Interventions Patient/family education;Compensatory techniques;Internal/external aids;SLP instruction  and feedback;Cognitive reorganization;Compensatory strategies;Oral motor exercises;Cueing hierarchy;Functional tasks  any or all may be used   Potential to Achieve Goals Good   Consulted and Agree with Plan of Care Patient;Family member/caregiver   Family Member Consulted husband      Patient will benefit from skilled therapeutic intervention in order to improve the following deficits and impairments:   Cognitive communication deficit  Dysarthria and anarthria    Problem List Patient Active Problem List   Diagnosis Date Noted  .  Orthostatic hypotension 04/08/2016  . Muscle cramps 04/08/2016  . 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 Cerebral thrombosis with cerebral infarction 04/06/2016  . Basilar artery stenosis   . Dysarthria 04/05/2016  . History of CVA with residual deficit 04/05/2016  . Sinus tachycardia 04/05/2016  . Acute encephalopathy 04/05/2016  . Chronic pain 04/05/2016  . Hyperlipidemia 04/05/2016  . Ischemic stroke (HCC)   . 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 TIA (transient ischemic attack) 11/07/2014  . Uncontrolled hypertension 11/07/2014  . DM II (diabetes mellitus, type II), controlled (HCC) 11/07/2014    Cathleen Yagi ,MS, CCC-SLP  06/01/2016, 12:40 PM  [facility] [address] [place], [place], [address] Phone: [phone]   Fax:  [phone]  Name: Wendie SimmerJody D Rajagopalan MRN: [id number] Date of Birth: 1956/04/05

## 2016-06-02 ENCOUNTER — Ambulatory Visit: Payer: BC Managed Care – PPO | Admitting: Rehabilitation

## 2016-06-02 ENCOUNTER — Encounter: Payer: Self-pay | Admitting: Rehabilitation

## 2016-06-02 VITALS — BP 110/93

## 2016-06-02 DIAGNOSIS — R2689 Other abnormalities of gait and mobility: Secondary | ICD-10-CM

## 2016-06-02 DIAGNOSIS — I69354 Hemiplegia and hemiparesis following cerebral infarction affecting left non-dominant side: Secondary | ICD-10-CM | POA: Diagnosis not present

## 2016-06-02 DIAGNOSIS — M6281 Muscle weakness (generalized): Secondary | ICD-10-CM

## 2016-06-02 DIAGNOSIS — R2681 Unsteadiness on feet: Secondary | ICD-10-CM

## 2016-06-02 DIAGNOSIS — R293 Abnormal posture: Secondary | ICD-10-CM

## 2016-06-02 NOTE — Therapy (Signed)
[facility] [address] [place], [place], [address] Phone: [phone]   Fax:  [phone]  Physical Therapy Evaluation  Patient Details  Name: Shelly Mcdaniel MRN: [id number] Date of Birth: [birth year] Referring Provider: Marvel PlanJindong Xu, MD and Claudette LawsAndrew Kirsteins, MD  Encounter Date: 06/02/2016      PT End of Session - 06/02/16 1853    Visit Number 1   Number of Visits 21   Date for PT Re-Evaluation 08/01/16   Authorization Type BCBS 0 visit limit, 0 auth   PT Start Time 1102   PT Stop Time 1148   PT Time Calculation (min) 46 min   Activity Tolerance Patient limited by pain;Treatment limited secondary to medical complications (Comment)  pain in LE, limited by BP   Behavior [facility] for tasks assessed/performed      Past Medical History:  Diagnosis Date  . Diabetes mellitus without complication (HCC)   . Hyperlipidemia   . Hypertension   . Stroke [facility](HCC)     Past Surgical History:  Procedure Laterality Date  . BACK SURGERY    . BREAST SURGERY    . COLONOSCOPY WITH PROPOFOL N/A 12/24/2013   Procedure: COLONOSCOPY WITH PROPOFOL;  Surgeon: Charolett BumpersMartin K Johnson, MD;  Location: [facility];  Service: Endoscopy;  Laterality: N/A;    Vitals:   06/02/16 1855  BP: (!) 110/93         Subjective Assessment - 06/02/16 1108    Subjective "I ended up going back to the hospital and then went to rehab."    Patient is accompained by: Family member   Limitations House hold activities;Walking   Patient Stated Goals "I want to work on my left extremity."    Currently in Pain? Yes   Pain Score 2    Pain Location Arm   Pain Orientation Left   Pain Descriptors / Indicators Aching   Pain Type Chronic pain   Pain Onset More than a month ago   Pain Frequency Intermittent   Aggravating Factors  I don't know, staying in the same position   Pain Relieving Factors repositioning, placing in sling (for about an hour)             [facility] - 06/02/16 0001      Assessment   Medical Diagnosis CVA   Referring Provider Marvel PlanJindong Xu, MD and Claudette LawsAndrew Kirsteins, MD   Onset Date/Surgical Date 01/16/16  most recent CVA on 04/05/16   Hand Dominance Right   Prior Therapy acute, IP and HHPT following both CVA's     Precautions   Precautions Fall   Required Braces or Orthoses Other Brace/Splint   Other Brace/Splint L AFO     Restrictions   Weight Bearing Restrictions No     Balance Screen   Has the patient fallen in the past 6 months No   Has the patient had a decrease in activity level because of a fear of falling?  Yes   Is the patient reluctant to leave their home because of a fear of falling?  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, sport and exercise, more recently stopped working to attend school full time   [employer], play with dogs, cook, cross stich     Cognition   Overall Cognitive Status Impaired/Different from baseline   Area of Impairment Attention;Awareness   Current Attention Level Sustained   Memory Decreased short-term memory   Attention Sustained   Sustained Attention Impaired   Sustained Attention Impairment Functional basic   Awareness Impaired     Sensation   Light Touch Impaired Detail   Light Touch Impaired Details Impaired LLE  slightly duller than RLE   Proprioception Appears Intact     Coordination   Gross Motor Movements are Fluid  and Coordinated No   Fine Motor Movements are Fluid and Coordinated No   Coordination and Movement Description decreased due to strength     Posture/Postural Control   Posture/Postural Control Postural limitations   Postural Limitations Flexed trunk;Posterior pelvic tilt   Posture Comments Lateral trunk flexion to R side, passive elongation of trunk on L.  Cueing emphasized scooting to EOM/edge of seat to decrease posterior pelvic tilt, to promote more active postural alignment. Cueing also for symmetrical LE positioning to promote more normalized posture.     Tone   Assessment Location Left Lower Extremity     ROM / Strength   AROM / PROM / Strength Strength     Strength   Overall Strength Deficits   Overall Strength Comments L hip flex 3-/5, L hip ext 4/5, L knee ext 2-/5 (in SAQ position), L knee flex 2-/5, L ankle DF/PF 0/5     Bed Mobility   Bed Mobility Right Sidelying to Sit;Rolling Right;Sit to Sidelying Right   Rolling Right 5: Supervision   Right Sidelying to Sit 4: Min assist   Right Sidelying to Sit Details (indicate cue type and reason) Some assist for guiding LLE off EOM also cues for moving LLE off mat prior to attempting to sit up.    Sit to Supine 5: Supervision   Sit to Supine - Details (indicate cue type and reason) Pt with good management of LE and did well remaining on R side      Transfers   Transfers Sit to Stand;Stand to Sit;Stand Pivot Transfers   Sit to Stand 4: Min assist;3: Mod assist   Sit to Stand Details Verbal cues for sequencing;Verbal cues for technique;Verbal cues for precautions/safety;Manual facilitation for weight shifting;Manual facilitation for weight bearing   Sit to Stand Details (indicate cue type and reason) cues for forward weight shift   Stand to Sit 4: Min assist   Stand to Sit Details (indicate cue type and reason) Verbal cues for sequencing;Verbal cues for technique;Verbal cues for precautions/safety;Verbal cues for safe use of  DME/AE;Manual facilitation for weight bearing;Manual facilitation for weight shifting   Stand to Sit Details Cues for forward trunk lean when sitting   Stand Pivot Transfers 4: Min assist;3: Mod assist   Stand Pivot Transfer Details (indicate cue type and reason) manual facilitation and cues for forward weight shift and step by step cues for sequencing LE movement.      Ambulation/Gait   Ambulation/Gait Yes   Ambulation/Gait Assistance 1: +2 Total assist  +2 A for safety   Ambulation/Gait Assistance Details Had +2 A with w/c follow for safety  due to BP dropping when upright.  Cues for slower gait speed and increaesd stride length, however note pt more midline with gait.  Also note that pt with increased pain in LUE with platform RW, therefore may have to switch and use [facility] as this is what they have been trying to use at home.    Ambulation Distance (Feet) 30 Feet   Assistive device Left platform walker   Gait Pattern Step-through pattern;Decreased stance time - left;Decreased hip/knee flexion - left;Decreased dorsiflexion - left;Lateral trunk lean to right;Decreased arm swing - left;Decreased weight shift to left;Narrow base of support;Left circumduction;Left genu recurvatum;Ataxic   Ambulation Surface Level;Indoor     LLE Tone   LLE Tone Mild                           PT Education - 06/02/16 1852    Education provided Yes   Education Details evaluation findings, BP during upright mobility, POC, goals   Person(s) Educated Patient;Spouse   Methods Explanation   Comprehension Verbalized understanding          PT Short Term Goals - 06/02/16 1905      PT SHORT TERM GOAL #1   Title Pt will initiate HEP in order to indicate improved functional mobility and decreased fall risk.  (Target Date: 06/30/16)   Time 4   Period Weeks   Status New     PT SHORT TERM GOAL #2   Title Pt will perform stand pivot transfer w/ LRAD at S level in order to increase independence at  home.     Time 4   Period Weeks   Status New     PT SHORT TERM GOAL #3   Title Pt will perform bed mobility at mod I level in order to indicate improved functional independence.     Time 4   Period Weeks   Status New     PT SHORT TERM GOAL #4   Title Pt will ambulate 100' w/ LRAD at min A level in order to indicate improved household ambulation.     Time 4   Period Weeks   Status New     PT SHORT TERM GOAL #5   Title Pt will perform PASS and improve score 3 points in order to indicate improved functional mobility.     Time 4   Period Weeks   Status New           PT Long Term Goals - 06/02/16 1910      PT LONG TERM GOAL #1   Title Pt/spouse will be independent with HEP in order to indicate improved functional mobility and decreased fall risk.  (Target Date: 07/28/16)   Time 8   Period Weeks   Status New     PT LONG TERM GOAL #2   Title Pt will perform stand pivot transfers w/ LRAD at mod I level in order to indicate improved functional independence.    Time 8   Period Weeks   Status New     PT LONG TERM GOAL #3   Title Pt will ambulate x 200' w/ LRAD at S level over indoor surfaces in order to increase independence with household ambulation.     Time 8   Period Weeks   Status New     PT LONG TERM GOAL #4   Title Pt will perform dynamic standing balance with intermittent UE support x 5 mins in order to increase  independence with ADLs.    Time 8   Period Weeks   Status New     PT LONG TERM GOAL #5   Title Pt will ambulate over paved outdoor surfaces w/ LRAD x 200' at S level in order to indicate initiation of community ambulation.    Time 8   Period Weeks   Status New               Plan - 06/02/16 1857    Clinical Impression Statement Pt familiar to PT with CVA in August of 2017 (R pontine) with new CVA on 04/05/16 causing increased L hemiplegia, decreased balance, decreased sensation, decreased cognition and generalized weakness (also note acute  delirium following D/C home from UTI).  Upon PT evaluation, note that pt presents with decreased strength in LLE, mild tone in LLE, decreased light touch in LLE, requires min to mod A for sit<>stand, stand pivot transfers and gait.  Note that she has not been standing or ambulating a lot at home.  Also note that PT mentioned getting HH aide for home to assist with ADLs and allow husband some respite time.  Pt and spouse interested in this and feel that they would greatly benefit from this service.  Pts spouse to call insurance company regariding this matter.  Pt is of evolving presentation and moderate complexity per PT POC.  Pt will greatly benefit from skilled OP neuro PT in order to address deficits.     Rehab Potential Good   Clinical Impairments Affecting Rehab Potential pt motivated by may be limited due to medical complexity and poor cognition   PT Frequency 2x / week  then 3x/wk for 4 weeks   PT Duration 4 weeks  then 3x/wk for 4 week   PT Treatment/Interventions ADLs/Self Care Home Management;Canalith Repostioning;Electrical Stimulation;DME Instruction;Stair training;Gait training;Functional mobility training;Therapeutic activities;Therapeutic exercise;Balance training;Neuromuscular re-education;Patient/family education;Orthotic Fit/Training;Passive range of motion;Energy conservation;Taping;Vestibular;Visual/perceptual remediation/compensation   PT Next Visit Plan PASS, Work on sit<>stand with pt and also with pt/husband, stand pivot transfers with device (also practice with pt and husband), gait with [facility] (will likely need give mohr sling), initiate SIMPLE HEP    Consulted and Agree with Plan of Care Patient;Family member/caregiver   Family Member Consulted Husband [name]      Patient will benefit from skilled therapeutic intervention in order to improve the following deficits and impairments:  Abnormal gait, Decreased activity tolerance, Decreased balance, Decreased cognition, Decreased  coordination, Decreased endurance, Decreased knowledge of precautions, Decreased knowledge of use of DME, Decreased mobility, Decreased safety awareness, Decreased strength, Impaired perceived functional ability, Impaired flexibility, Impaired sensation, Impaired tone, Impaired UE functional use, Improper body mechanics, Postural dysfunction  Visit Diagnosis: Hemiplegia and hemiparesis following cerebral infarction affecting left non-dominant side (HCC) - Plan: PT plan of care cert/re-cert  Other abnormalities of gait and mobility - Plan: PT plan of care cert/re-cert  Muscle weakness (generalized) - Plan: PT plan of care cert/re-cert  Abnormal posture - Plan: PT plan of care cert/re-cert  Unsteadiness on feet - Plan: PT plan of care cert/re-cert     Problem List Patient Active Problem List   Diagnosis Date Noted  . Orthostatic hypotension 04/08/2016  . Muscle cramps 04/08/2016  . 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 Cerebral thrombosis with cerebral infarction 04/06/2016  . Basilar artery stenosis   . Dysarthria 04/05/2016  . History of CVA with residual deficit 04/05/2016  . Sinus tachycardia 04/05/2016  .  Acute encephalopathy 04/05/2016  . Chronic pain 04/05/2016  . Hyperlipidemia 04/05/2016  . Ischemic stroke (HCC)   . 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 TIA (transient ischemic attack) 11/07/2014  . Uncontrolled hypertension 11/07/2014  . DM II (diabetes mellitus, type II), controlled (HCC) 11/07/2014    Shelly Mcdaniel, PT, MPT [facility] [address] [place], [place], [address] Phone: [phone]   Fax:  [phone] 06/02/16, 7:18 PM  Name: Shelly Mcdaniel MRN: [id number] Date of Birth: February 26, 1956

## 2016-06-03 DIAGNOSIS — Z0289 Encounter for other administrative examinations: Secondary | ICD-10-CM

## 2016-06-06 ENCOUNTER — Emergency Department (HOSPITAL_COMMUNITY)
Admission: EM | Admit: 2016-06-06 | Discharge: 2016-06-06 | Disposition: A | Discharge status: Home / Self Care | Payer: BC Managed Care – PPO | Attending: Emergency Medicine | Admitting: Emergency Medicine

## 2016-06-06 ENCOUNTER — Encounter (HOSPITAL_COMMUNITY): Payer: Self-pay | Admitting: Emergency Medicine

## 2016-06-06 DIAGNOSIS — N390 Urinary tract infection, site not specified: Secondary | ICD-10-CM | POA: Insufficient documentation

## 2016-06-06 DIAGNOSIS — Z79899 Other long term (current) drug therapy: Secondary | ICD-10-CM | POA: Diagnosis not present

## 2016-06-06 DIAGNOSIS — Z8673 Personal history of transient ischemic attack (TIA), and cerebral infarction without residual deficits: Secondary | ICD-10-CM | POA: Insufficient documentation

## 2016-06-06 DIAGNOSIS — Z7982 Long term (current) use of aspirin: Secondary | ICD-10-CM | POA: Insufficient documentation

## 2016-06-06 DIAGNOSIS — E119 Type 2 diabetes mellitus without complications: Secondary | ICD-10-CM | POA: Diagnosis not present

## 2016-06-06 DIAGNOSIS — Z7984 Long term (current) use of oral hypoglycemic drugs: Secondary | ICD-10-CM | POA: Insufficient documentation

## 2016-06-06 DIAGNOSIS — R300 Dysuria: Secondary | ICD-10-CM | POA: Diagnosis present

## 2016-06-06 DIAGNOSIS — I10 Essential (primary) hypertension: Secondary | ICD-10-CM | POA: Diagnosis not present

## 2016-06-06 HISTORY — DX: Urinary tract infection, site not specified: N39.0

## 2016-06-06 LAB — COMPREHENSIVE METABOLIC PANEL
ALT: 21 U/L (ref 14–54)
ANION GAP: 12 (ref 5–15)
AST: 18 U/L (ref 15–41)
Albumin: 3.4 g/dL — ABNORMAL LOW (ref 3.5–5.0)
Alkaline Phosphatase: 46 U/L (ref 38–126)
BUN: 11 mg/dL (ref 6–20)
CHLORIDE: 105 mmol/L (ref 101–111)
CO2: 25 mmol/L (ref 22–32)
Calcium: 8.9 mg/dL (ref 8.9–10.3)
Creatinine, Ser: 0.66 mg/dL (ref 0.44–1.00)
GFR calc non Af Amer: >60 mL/min (ref >60)
Glucose, Bld: 88 mg/dL (ref 65–99)
POTASSIUM: 3.2 mmol/L — AB (ref 3.5–5.1)
SODIUM: 142 mmol/L (ref 135–145)
Total Bilirubin: 0.3 mg/dL (ref 0.3–1.2)
Total Protein: 6 g/dL — ABNORMAL LOW (ref 6.5–8.1)

## 2016-06-06 LAB — CBC
HEMATOCRIT: 30 % — AB (ref 36.0–46.0)
HEMOGLOBIN: 10.6 g/dL — AB (ref 12.0–15.0)
MCH: 30.3 pg (ref 26.0–34.0)
MCHC: 35.3 g/dL (ref 30.0–36.0)
MCV: 85.7 fL (ref 78.0–100.0)
Platelets: 179 10*3/uL (ref 150–400)
RBC: 3.5 MIL/uL — AB (ref 3.87–5.11)
RDW: 13.1 % (ref 11.5–15.5)
WBC: 5.5 10*3/uL (ref 4.0–10.5)

## 2016-06-06 LAB — URINALYSIS, ROUTINE W REFLEX MICROSCOPIC
BILIRUBIN URINE: NEGATIVE
Glucose, UA: NEGATIVE mg/dL
Hgb urine dipstick: NEGATIVE
KETONES UR: NEGATIVE mg/dL
Leukocytes, UA: NEGATIVE
Nitrite: POSITIVE — AB
Protein, ur: NEGATIVE mg/dL
SPECIFIC GRAVITY, URINE: 1.012 (ref 1.005–1.030)
pH: 5 (ref 5.0–8.0)

## 2016-06-06 LAB — LIPASE, BLOOD: LIPASE: 19 U/L (ref 11–51)

## 2016-06-06 MED ORDER — TRAMADOL HCL 50 MG PO TABS
50.0000 mg | ORAL_TABLET | Freq: Once | ORAL | Status: AC
  Administered 2016-06-06: 50 mg via ORAL
  Filled 2016-06-06: qty 1

## 2016-06-06 MED ORDER — MORPHINE SULFATE (PF) 4 MG/ML IV SOLN: 6.0000 mg | Freq: Once | INTRAVENOUS | Status: DC

## 2016-06-06 MED ORDER — ONDANSETRON HCL 4 MG PO TABS: 4.0000 mg | ORAL_TABLET | Freq: Three times a day (TID) | ORAL | 0 refills | Status: AC | PRN

## 2016-06-06 MED ORDER — CIPROFLOXACIN IN D5W 400 MG/200ML IV SOLN
400.0000 mg | Freq: Once | INTRAVENOUS | Status: AC
  Administered 2016-06-06: 400 mg via INTRAVENOUS
  Filled 2016-06-06: qty 200

## 2016-06-06 MED ORDER — PHENAZOPYRIDINE HCL 200 MG PO TABS: 200.0000 mg | ORAL_TABLET | Freq: Three times a day (TID) | ORAL | 0 refills | Status: DC | PRN

## 2016-06-06 MED ORDER — POTASSIUM CHLORIDE CRYS ER 20 MEQ PO TBCR
40.0000 meq | EXTENDED_RELEASE_TABLET | Freq: Once | ORAL | Status: AC
  Administered 2016-06-06: 40 meq via ORAL
  Filled 2016-06-06: qty 2

## 2016-06-06 MED ORDER — OXYCODONE-ACETAMINOPHEN 5-325 MG PO TABS
1.0000 | ORAL_TABLET | Freq: Once | ORAL | Status: AC
  Administered 2016-06-06: 1 via ORAL
  Filled 2016-06-06: qty 1

## 2016-06-06 MED ORDER — SODIUM CHLORIDE 0.9 % IV BOLUS (SEPSIS)
1000.0000 mL | Freq: Once | INTRAVENOUS | Status: AC
  Administered 2016-06-06: 1000 mL via INTRAVENOUS

## 2016-06-06 MED ORDER — CIPROFLOXACIN HCL 500 MG PO TABS: 500.0000 mg | ORAL_TABLET | Freq: Two times a day (BID) | ORAL | 0 refills | Status: DC

## 2016-06-06 NOTE — ED Provider Notes (Signed)
[facility]-EMER[facility]ENCY DEPT Provider Note   CSN: [id number] Arrival date & time: 06/06/16  0534     History   Chief Complaint Chief Complaint  Patient presents with  . Urinary Tract Infection  . Flank Pain    HPI Shelly Mcdaniel is a 60 y.o. female.  HPI   60 year old female with history of diabetes, hypertension hyperlipidemia, prior stroke, recurrent UTI brought in by family member for evaluation of abdominal pain. History obtained through daughter who is at bedside. Since last night patient has been complaining of low abdominal pain along with dysuria. She wears adult diaper and has had multiple bouts of urinary incontinence throughout the night. She was nauseous previously which improves with Zofran at home. She reports having chills.  Her symptoms are similar to prior UTI that she has been treated in the past, most recent was a few weeks ago and was treated with Cipro with improvement. She denies having fever, chest pain, trouble breathing, productive cough, hematuria, bowel changes. Her appetite has been normal. She has left-sided deficit secondary to her stroke. At home she has been taking Tylenol, and Pyridium with mild improvement. She did complain of transient left flank pain last night which has resolved.  Past Medical History:  Diagnosis Date  . Diabetes mellitus without complication (HCC)   . Hyperlipidemia   . Hypertension   . Stroke (HCC)   . UTI (urinary tract infection)     Patient Active Problem List   Diagnosis Date Noted  . Orthostatic hypotension 04/08/2016  . Muscle cramps 04/08/2016  . 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 Cerebral thrombosis with cerebral infarction 04/06/2016  . Basilar artery stenosis   . Dysarthria 04/05/2016  . History of CVA with residual deficit 04/05/2016  . Sinus tachycardia 04/05/2016  . Acute encephalopathy 04/05/2016  . Chronic pain 04/05/2016    . Hyperlipidemia 04/05/2016  . Ischemic stroke (HCC)   . 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 TIA (transient ischemic attack) 11/07/2014  . Uncontrolled hypertension 11/07/2014  . DM II (diabetes mellitus, type II), controlled (HCC) 11/07/2014    Past Surgical History:  Procedure Laterality Date  . BACK SURGERY    . BREAST SURGERY    . COLONOSCOPY WITH PROPOFOL N/A 12/24/2013   Procedure: COLONOSCOPY WITH PROPOFOL;  Surgeon: Charolett Bumpers, MD;  Location: [facility];  Service: Endoscopy;  Laterality: N/A;    OB History    No data available       Home Medications    Prior to Admission medications   Medication Sig Start Date End Date Taking? Authorizing Provider  aspirin EC 325 MG EC tablet Take 1 tablet (325 mg total) by mouth daily. 04/09/16  Yes Nishant Dhungel, MD  baclofen (LIORESAL) 10 MG tablet Take 1.5 tablets (15 mg total) by mouth 3 (three) times daily. 04/29/16  Yes Daniel J Angiulli, PA-C  cholecalciferol (VITAMIN D) 1000 units tablet Take 2,000 Units by mouth daily.   Yes Historical Provider, MD  clopidogrel (PLAVIX) 75 MG tablet Take 1 tablet (75 mg total) by mouth daily. 04/29/16  Yes Daniel J Angiulli, PA-C  docusate sodium (COLACE) 100 MG capsule Take 100 mg by mouth 2 (two) times daily.    Yes Historical Provider, MD  LEVEMIR FLEXTOUCH 100 UNIT/ML Pen Inject 25 Units into the skin every evening. Patient taking differently: Inject 18-25 Units into the skin every evening.  04/29/16  Yes Daniel J Angiulli, PA-C  lidocaine (LIDODERM)  5 % Place 3 patches onto the skin daily. Remove & Discard patch within 12 hours or as directed by MD Patient taking differently: Place 3 patches onto the skin daily as needed (pain). Remove & Discard patch within 12 hours or as directed by MD 04/29/16  Yes Mcarthur Rossettianiel J Angiulli, PA-C  lisinopril (PRINIVIL,ZESTRIL) 10 MG tablet Take 1 tablet (10 mg total) by mouth daily. Patient taking differently: Take 10 mg by mouth daily as needed (high blood  pressure).  04/29/16  Yes Daniel J Angiulli, PA-C  Melatonin 3 MG TABS Place 2 tablets (6 mg total) into feeding tube at bedtime. Patient taking differently: Take 6 mg by mouth at bedtime.  04/29/16  Yes Daniel J Angiulli, PA-C  metFORMIN (GLUCOPHAGE) 850 MG tablet Take 1 tablet (850 mg total) by mouth 2 (two) times daily with a meal. Patient taking differently: Take 1,000 mg by mouth 2 (two) times daily with a meal.  04/29/16  Yes Mcarthur Rossettianiel J Angiulli, PA-C  methylphenidate (RITALIN) 5 MG tablet Take 1 tablet (5 mg total) by mouth 2 (two) times daily with breakfast and lunch. 04/29/16  Yes Mcarthur Rossettianiel J Angiulli, PA-C  metoprolol succinate (TOPROL-XL) 25 MG 24 hr tablet Take 25 mg by mouth every morning.   Yes Historical Provider, MD  phenazopyridine (PYRIDIUM) 200 MG tablet Take 1 tablet (200 mg total) by mouth 3 (three) times daily. Patient taking differently: Take 200 mg by mouth 3 (three) times daily as needed for pain.  05/13/16  Yes Nicole Pisciotta, PA-C  polyethylene glycol (MIRALAX / GLYCOLAX) packet Take 17 g by mouth daily as needed for moderate constipation.   Yes Historical Provider, MD  potassium chloride (K-DUR,KLOR-CON) 10 MEQ tablet Take 1 tablet (10 mEq total) by mouth 2 (two) times daily. 04/29/16  Yes Daniel J Angiulli, PA-C  rosuvastatin (CRESTOR) 20 MG tablet Take 1 tablet (20 mg total) by mouth at bedtime. Patient taking differently: Take 10 mg by mouth at bedtime.  05/30/16  Yes Marvel PlanJindong Xu, MD  scopolamine (TRANSDERM-SCOP) 1 MG/3DAYS Place 1 patch (1.5 mg total) onto the skin every 3 (three) days. Patient taking differently: Place 1 patch onto the skin daily as needed (nausea).  04/29/16  Yes Daniel J Angiulli, PA-C  senna (SENOKOT) 8.6 MG tablet Take 1 tablet by mouth daily as needed for constipation.    Yes Historical Provider, MD  sertraline (ZOLOFT) 50 MG tablet Take 1 tablet (50 mg total) by mouth at bedtime. 04/29/16  Yes Daniel J Angiulli, PA-C  traMADol (ULTRAM) 50 MG tablet  Take 1 tablet (50 mg total) by mouth every 6 (six) hours as needed. Patient taking differently: Take 50 mg by mouth every 6 (six) hours as needed.  04/29/16  Yes Daniel J Angiulli, PA-C  midodrine (PROAMATINE) 5 MG tablet Take 1 tablet (5 mg total) by mouth 2 (two) times daily with a meal. One in the morning and one in the early afternoon and no later than 4pm Patient not taking: Reported on 05/24/2016 05/23/16   Marvel PlanJindong Xu, MD    Family History Family History  Problem Relation Age of Onset  . Stroke Mother   . Dementia Mother   . Diabetes Father   . Stroke Father   . CAD Father   . Diabetes Sister   . Diabetes Brother   . CAD Brother   . Stroke Brother   . Kidney failure Brother   . Hypertension Other   . Hyperlipidemia Other   . Stroke Other   .  Heart disease Other   . Diabetes Other     Social History Social History  Substance Use Topics  . Smoking status: Never Smoker  . Smokeless tobacco: Never Used  . Alcohol use No     Allergies   Bee venom; Codeine; Lipitor [atorvastatin]; and Septra [sulfamethoxazole-trimethoprim]   Review of Systems Review of Systems  All other systems reviewed and are negative.    Physical Exam Updated Vital Signs Ht 5\' 8"  (1.727 m)   Wt 59 kg   BMI 19.77 kg/m   Physical Exam  Constitutional: She is oriented to person, place, and time. She appears well-developed and well-nourished. No distress.  HENT:  Head: Atraumatic.  Eyes: Conjunctivae are normal.  Neck: Neck supple.  Cardiovascular: Normal rate and regular rhythm.   Pulmonary/Chest: Effort normal and breath sounds normal. No respiratory distress.  Abdominal: Soft. She exhibits no distension. There is tenderness (Mild diffuse abdominal tenderness most significant to suprapubic region).  Neurological: She is alert and oriented to person, place, and time.  Left-sided weakness with left arm contracture  Skin: No rash noted.  Psychiatric: She has a normal mood and affect.   Nursing note and vitals reviewed.    ED Treatments / Results  Labs (all labs ordered are listed, but only abnormal results are displayed) Labs Reviewed  URINALYSIS, ROUTINE W REFLEX MICROSCOPIC - Abnormal; Notable for the following:       Result Value   Color, Urine AMBER (*)    Nitrite POSITIVE (*)    Bacteria, UA RARE (*)    Squamous Epithelial / LPF 0-5 (*)    All other components within normal limits  COMPREHENSIVE METABOLIC PANEL - Abnormal; Notable for the following:    Potassium 3.2 (*)    Total Protein 6.0 (*)    Albumin 3.4 (*)    All other components within normal limits  CBC - Abnormal; Notable for the following:    RBC 3.50 (*)    Hemoglobin 10.6 (*)    HCT 30.0 (*)    All other components within normal limits  URINE CULTURE  LIPASE, BLOOD    EKG  EKG Interpretation None       Radiology No results found.  Procedures Procedures (including critical care time)  Medications Ordered in ED Medications  sodium chloride 0.9 % bolus 1,000 mL (0 mLs Intravenous Stopped 06/06/16 0743)  traMADol (ULTRAM) tablet 50 mg (50 mg Oral Given 06/06/16 0700)  ciprofloxacin (CIPRO) IVPB 400 mg (0 mg Intravenous Stopped 06/06/16 0904)  oxyCODONE-acetaminophen (PERCOCET/ROXICET) 5-325 MG per tablet 1 tablet (1 tablet Oral Given 06/06/16 0756)  potassium chloride SA (K-DUR,KLOR-CON) CR tablet 40 mEq (40 mEq Oral Given 06/06/16 0911)     Initial Impression / Assessment and Plan / ED Course  I have reviewed the triage vital signs and the nursing notes.  Pertinent labs & imaging results that were available during my care of the patient were reviewed by me and considered in my medical decision making (see chart for details).  Clinical Course     BP (!) 162/103   Pulse 90   Temp 98 F (36.7 C) (Oral)   Resp 16   Ht 5\' 8"  (1.727 m)   Wt 59 kg   SpO2 98%   BMI 19.77 kg/m    Final Clinical Impressions(s) / ED Diagnoses   Final diagnoses:  Lower urinary tract  infectious disease    New Prescriptions New Prescriptions   CIPROFLOXACIN (CIPRO) 500 MG TABLET  Take 1 tablet (500 mg total) by mouth 2 (two) times daily. One po bid x 7 days   ONDANSETRON (ZOFRAN) 4 MG TABLET    Take 1 tablet (4 mg total) by mouth every 8 (eight) hours as needed for nausea or vomiting.   6:31 AM Patient with history of recurrent urinary tract infection, wearing adult diaper which increased risk of recurring UTI. She does have diffuse abdominal tenderness most significant to suprapubic region. She's afebrile, no hypotension.  She's mentating appropriately.  She is wheel chair bound.  Work up initiated.  IVF, antinausea and pain medication given.   7:33 AM The urine with positive nitrites, symptoms suggestive of UTI, therefore will treat patient's symptoms with antibiotics. Patient and family voice good tolerance with Cipro in the past. Will treat with Cipro, urine culture sent. We did discuss options of CT scan, however given symptoms of dysuria and signs of UTI, we'll focus on treating her infection with antibiotics. 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 We'll continue to provide pain management.    9:14 AM Patient felt better after treatment. Mild hypokalemia with history of hypokalemia in the past. Potassium supplementation given. Patient able to tolerates by mouth. She feels comfortable going home. She will be discharged with ciprofloxacin, Pyridium, and antinausea medication. Return precaution discussed.     Fayrene Helper, PA-C 06/06/16 0915    Tilden Fossa, MD 06/08/16 Barry Brunner

## 2016-06-06 NOTE — ED Triage Notes (Signed)
Per pt and family pt has been having ABD pain for approx 12 hrs  and low bladder pain. Left side flank pain. 5 accidents in the last 24 hours. Urgency. No burning or stinging and no vaginal present. Malodorous.

## 2016-06-07 LAB — URINE CULTURE: CULTURE: NO GROWTH

## 2016-06-08 ENCOUNTER — Ambulatory Visit: Payer: BC Managed Care – PPO

## 2016-06-08 DIAGNOSIS — I69354 Hemiplegia and hemiparesis following cerebral infarction affecting left non-dominant side: Secondary | ICD-10-CM | POA: Diagnosis not present

## 2016-06-08 DIAGNOSIS — R471 Dysarthria and anarthria: Secondary | ICD-10-CM

## 2016-06-08 DIAGNOSIS — R41841 Cognitive communication deficit: Secondary | ICD-10-CM

## 2016-06-08 NOTE — Patient Instructions (Signed)
Continue to work with Scientist, forensicsorting laundry, silverware, etc to improve your concentration.

## 2016-06-08 NOTE — Therapy (Signed)
[facility] [address] [place], [place], [address] Phone: [phone]   Fax:  [phone]  Speech Language Pathology Treatment  Patient Details  Name: Shelly Mcdaniel MRN: [id number] Date of Birth: Oct 24, 1955 Referring Provider: Claudette LawsKirsteins, Andrew, MD  Encounter Date: 06/08/2016      End of Session - 06/08/16 1219    Visit Number 2   Number of Visits 17   Date for SLP Re-Evaluation 08/19/16   SLP Start Time 1103   SLP Stop Time  1145   SLP Time Calculation (min) 42 min   Activity Tolerance Patient tolerated treatment well      Past Medical History:  Diagnosis Date  . Diabetes mellitus without complication (HCC)   . Hyperlipidemia   . Hypertension   . Stroke (HCC)   . UTI (urinary tract infection)     Past Surgical History:  Procedure Laterality Date  . BACK SURGERY    . BREAST SURGERY    . COLONOSCOPY WITH PROPOFOL N/A 12/24/2013   Procedure: COLONOSCOPY WITH PROPOFOL;  Surgeon: Charolett BumpersMartin K Johnson, MD;  Location: [facility];  Service: Endoscopy;  Laterality: N/A;    There were no vitals filed for this visit.      Subjective Assessment - 06/08/16 1113    Subjective Pt back at ED on Monday 06-06-16 with UTI diagnosis.   Patient is accompained by: --  husband   Currently in Pain? Yes   Pain Score 2    Pain Location Flank   Pain Orientation Left   Pain Descriptors / Indicators Aching;Burning   Pain Type Acute pain   Pain Onset In the past 7 days   Pain Frequency Occasional   Aggravating Factors  frequent urination   Pain Relieving Factors nothing               ADULT SLP TREATMENT - 06/08/16 1115      General Information   Behavior/Cognition Alert;Cooperative;Pleasant mood;Lethargic   Patient Positioning Upright in chair     Cognitive-Linquistic Treatment   Treatment focused on Cognition;Dysarthria   Skilled Treatment Sustained attention for divergent naing appears decreased from  eval - possibly due to UTI. Pt req'd written cues for any success with three word recall in alphabetical order. In separating cards into suits, pt req'd min-mod A usually to look to lt, and for awareness of errors/consolidating card sets.      Assessment / Recommendations / Plan   Plan Continue with current plan of care     Progression Toward Goals   Progression toward goals Progressing toward goals            SLP Short Term Goals - 06/08/16 1111      SLP SHORT TERM GOAL #1   Title pt will demo sustained/selective attention in NOVEL therapy tasks in a min-mod noisy environment for 10 minutes, with rare min A back to task   Time 4   Period Weeks   Status On-going     SLP SHORT TERM GOAL #2   Title pt will demo awareness of errors in novel therapy tasks, 60% of the time, over three sessions   Time 4   Period Weeks   Status On-going     SLP SHORT TERM GOAL #3   Title pt will demo exercises for incr'd breath support with occasional min A over three sessions   Time 4   Period Weeks   Status On-going          SLP  Long Term Goals - 06/08/16 1111      SLP LONG TERM GOAL #1   Title pt will demo alternating attention with simple therapy tasks with rare min A needed for redirection/finding place back within the task   Time 8   Period Weeks   Status On-going     SLP LONG TERM GOAL #2   Title pt will exhibit emergent awareness in simple to mod complex NOVEL cognitive linguistic tasks 90% of the time over three sessions   Time 8   Period Weeks   Status On-going     SLP LONG TERM GOAL #3   Title pt will deom WNL breath support for multiple sentence responses in 10 minutes conversation over three sessions    Time 8   Period Weeks   Status On-going          Plan - 06/08/16 1219    Clinical Impression Statement Pt continues to present today with deficits in cognitive-linguistics specifically attention skills and awareness. SLP's opinion is that these deficits affect other  areas of cognitive linguistics such as problem solving, reasoning, executive function, and memory. Pt also demonstrated decr'd breath support resulting in low speech volume at the phrase level. She would beneift from skilled ST focusing on cognitive linguistics and dysarthria to decr caregiver burden and improve overall communication skills with family and community members.   Speech Therapy Frequency 2x / week   Duration --  8 weeks, or 16 therapy visits   Treatment/Interventions Patient/family education;Compensatory techniques;Internal/external aids;SLP instruction and feedback;Cognitive reorganization;Compensatory strategies;Oral motor exercises;Cueing hierarchy;Functional tasks  any or all may be used   Potential to Achieve Goals Good   Consulted and Agree with Plan of Care Patient;Family member/caregiver   Family Member Consulted husband      Patient will benefit from skilled therapeutic intervention in order to improve the following deficits and impairments:   Cognitive communication deficit  Dysarthria and anarthria    Problem List Patient Active Problem List   Diagnosis Date Noted  . Orthostatic hypotension 04/08/2016  . Muscle cramps 04/08/2016  . 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 Cerebral thrombosis with cerebral infarction 04/06/2016  . Basilar artery stenosis   . Dysarthria 04/05/2016  . History of CVA with residual deficit 04/05/2016  . Sinus tachycardia 04/05/2016  . Acute encephalopathy 04/05/2016  . Chronic pain 04/05/2016  . Hyperlipidemia 04/05/2016  . Ischemic stroke (HCC)   . 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 TIA (transient ischemic attack) 11/07/2014  . Uncontrolled hypertension 11/07/2014  . DM II (diabetes mellitus, type II), controlled (HCC) 11/07/2014    Shelly Mcdaniel ,MS, CCC-SLP  06/08/2016, 12:21 PM  [facility] [address] [place], [place], [address] Phone: [phone]   Fax:  [phone]   Name: Shelly SimmerJody D Styron MRN: [id number] Date of Birth: Mar 15, 1956

## 2016-06-14 ENCOUNTER — Ambulatory Visit: Payer: BC Managed Care – PPO

## 2016-06-15 ENCOUNTER — Ambulatory Visit: Payer: BC Managed Care – PPO | Attending: Physical Medicine and Rehabilitation | Admitting: Occupational Therapy

## 2016-06-15 ENCOUNTER — Encounter: Payer: Self-pay | Admitting: Occupational Therapy

## 2016-06-15 VITALS — BP 143/102 | HR 99

## 2016-06-15 DIAGNOSIS — R41842 Visuospatial deficit: Secondary | ICD-10-CM | POA: Diagnosis present

## 2016-06-15 DIAGNOSIS — M6281 Muscle weakness (generalized): Secondary | ICD-10-CM | POA: Diagnosis present

## 2016-06-15 DIAGNOSIS — R41841 Cognitive communication deficit: Secondary | ICD-10-CM | POA: Diagnosis present

## 2016-06-15 DIAGNOSIS — I69315 Cognitive social or emotional deficit following cerebral infarction: Secondary | ICD-10-CM | POA: Diagnosis present

## 2016-06-15 DIAGNOSIS — R471 Dysarthria and anarthria: Secondary | ICD-10-CM | POA: Insufficient documentation

## 2016-06-15 DIAGNOSIS — R278 Other lack of coordination: Secondary | ICD-10-CM | POA: Diagnosis present

## 2016-06-15 DIAGNOSIS — I69354 Hemiplegia and hemiparesis following cerebral infarction affecting left non-dominant side: Secondary | ICD-10-CM | POA: Diagnosis present

## 2016-06-15 DIAGNOSIS — R2689 Other abnormalities of gait and mobility: Secondary | ICD-10-CM | POA: Diagnosis present

## 2016-06-15 DIAGNOSIS — M25512 Pain in left shoulder: Secondary | ICD-10-CM

## 2016-06-15 DIAGNOSIS — R2681 Unsteadiness on feet: Secondary | ICD-10-CM | POA: Diagnosis present

## 2016-06-15 DIAGNOSIS — R293 Abnormal posture: Secondary | ICD-10-CM | POA: Insufficient documentation

## 2016-06-15 NOTE — Therapy (Signed)
[facility] [address] [place], [place], [address] Phone: [phone]   Fax:  [phone]  Occupational Therapy Treatment  Patient Details  Name: Shelly SimmerJody D Mcdaniel MRN: [id number] Date of Birth: May 30, 1956 Referring Provider: Dr. Claudette LawsAndrew Kirsteins  Encounter Date: 06/15/2016      OT End of Session - 06/15/16 1253    Visit Number 2   Number of Visits 25   Date for OT Re-Evaluation 07/30/16   Authorization Type BC/BS   OT Start Time 1148   OT Stop Time 1231   OT Time Calculation (min) 43 min   Activity Tolerance Patient tolerated treatment well      Past Medical History:  Diagnosis Date  . Diabetes mellitus without complication (HCC)   . Hyperlipidemia   . Hypertension   . Stroke (HCC)   . UTI (urinary tract infection)     Past Surgical History:  Procedure Laterality Date  . BACK SURGERY    . BREAST SURGERY    . COLONOSCOPY WITH PROPOFOL N/A 12/24/2013   Procedure: COLONOSCOPY WITH PROPOFOL;  Surgeon: Charolett BumpersMartin K Johnson, MD;  Location: [facility];  Service: Endoscopy;  Laterality: N/A;    Vitals:   06/15/16 1154  BP: (!) 143/102  Pulse: 99        Subjective Assessment - 06/15/16 1154    Subjective  I went to the hospital on Christmas Day I had another stroke (pt did not she had a UTI)    Patient is accompained by: Family member  husband   Pertinent History see Epic, Dr Roda ShuttersXu has approved DBP less than 110 and HR 115-120 for therapy parameters.    Limitations elevated BP and orthostatic hypotension at times, monitor BP   Patient Stated Goals to be more indpendent   Currently in Pain? Yes   Pain Score 2    Pain Location Back   Pain Orientation Lower;Right   Pain Descriptors / Indicators Aching   Pain Type Chronic pain   Pain Onset More than a month ago   Pain Frequency Intermittent   Aggravating Factors  being in one position too long   Pain Relieving Factors moving and meds   Multiple Pain Sites No                       OT Treatments/Exercises (OP) - 06/15/16 0001      ADLs   Functional Mobility Addressed squat pivot vs stand pivot transfers with pt and husband.  Pt is able to be more active and more independent with squat pivot transfers (min vs mod a) - husband and pt verbalized understanding and husband states he will start doing squat pivot at home.  Also addressed bed mobility - pt needs max cues and step by step instruction, mod assist.  Pt with significant pain in L hip and L shoulder with transitional movements.    ADL Comments Addressed bed positioning again with pt and husband as pt with signifcant L shoulder pain (7/10 with any movement).  Reviewed and demonstrated how to suppport shoulder girdle and arm in supine and Right sidelying.  Recommended avoiding laying on L shoulder at this time given severe lack of stability, pain and poor alignment at this time. Pt and husband verbalized understanding and husband stated "I don't know why we stopped doing this at home we will start again."  also discussed need to consistently use half lap tray on wheelchair and to ensure that arm is fully suppported  when pt is in recliner chair.  Pt and husband in agreement.      Neurological Re-education Exercises   Other Exercises 1 Neuro re ed in supine and in sitting to address moving body on arm to mobilize L shoulder girdle with less pain - pt with sigificant pain and poor tolerance. Hsuband states pt to see rehab MD tomorrow in pain clinic.  Used body on arm to address shoulder abduction, shoulder flexion, elbow flexion and extension and scap mob.                    OT Short Term Goals - 06/15/16 1250      OT SHORT TERM GOAL #1   Title Pt/family independent with initial HEP for LUE (due 06/29/16)   Time 4   Period Weeks   Status On-going     OT SHORT TERM GOAL #2   Title Pt/family to verbalize understanding with proper positioning of LUE during activity and in bed to  prevent pain including use of sling /splint prn   Time 4   Period Weeks   Status On-going     OT SHORT TERM GOAL #3   Title Pt to perform UB dressing with min assist using hemi techniques   Time 4   Period Weeks   Status On-going     OT SHORT TERM GOAL #4   Title Pt will donn pants with mod A.   Time 4   Period Weeks   Status On-going     OT SHORT TERM GOAL #5   Title Pt will use LUE as a stabilizer with min A 25 % of the time for ADLS/ functional activity   Time 4   Period Weeks   Status On-going     OT SHORT TERM GOAL #6   Title Pt will perform bathing with min A for UB and mod A for LB   Time 4   Period Weeks   Status On-going           OT Long Term Goals - 06/15/16 1250      OT LONG TERM GOAL #1   Title Independent with updated HEP prn (all LTG's due 07/30/15)   Time 8   Period Weeks   Status On-going     OT LONG TERM GOAL #2   Title Pt to demo active low range sh. flexion to 25 degrees in prep for reaching and use of arm as stabilizer   Time 8   Period Weeks   Status On-going     OT LONG TERM GOAL #3   Title Pt to demo 25% gross finger flexion/extension to grasp/release cylindrical objects   Time 8   Period Weeks   Status On-going     OT LONG TERM GOAL #4   Title Pt will use LUE as a stabilizer for ADLs with min v.c. and pain less than or equal to 3/10   Time 8   Period Weeks   Status On-going     OT LONG TERM GOAL #5   Title Pt will perfrom toilet and shower transfers with min A.   Time 8   Period Weeks   Status On-going               Plan - 06/15/16 1251    Clinical Impression Statement Pt with very slow progression toward goals. Pt with significant pain in L shoulder and scheduled to see rehab MD in pain clinic tomororw per husband.  Rehab Potential Fair   OT Frequency 3x / week   OT Duration 8 weeks   OT Treatment/Interventions Self-care/ADL training;Moist Heat;DME and/or AE instruction;Splinting;Patient/family  education;Therapeutic exercises;Compression bandaging;Therapeutic activities;Neuromuscular education;Functional Mobility Training;Passive range of motion;Cognitive remediation/compensation;Electrical Stimulation;Manual Therapy;Dry needling;Visual/perceptual remediation/compensation   Plan monitor BP (see subective section for allowed parameters per MD), trunk/LUE NMR, adddress pain prn, functional mobitlity   Consulted and Agree with Plan of Care Patient;Family member/caregiver   Family Member Consulted Husband - [name], dtr      Patient will benefit from skilled therapeutic intervention in order to improve the following deficits and impairments:  Decreased activity tolerance, Decreased balance, Decreased cognition, Decreased knowledge of use of DME, Decreased coordination, Decreased endurance, Decreased range of motion, Decreased safety awareness, Decreased skin integrity, Decreased strength, Impaired perceived functional ability, Increased edema, Impaired sensation, Impaired tone, Impaired UE functional use, Impaired vision/preception, Pain, Improper body mechanics, Decreased mobility, Abnormal gait, Difficulty walking, Decreased knowledge of precautions  Visit Diagnosis: Hemiplegia and hemiparesis following cerebral infarction affecting left non-dominant side (HCC)  Muscle weakness (generalized)  Abnormal posture  Unsteadiness on feet  Acute pain of left shoulder  Other lack of coordination  Visuospatial deficit    Problem List Patient Active Problem List   Diagnosis Date Noted  . Orthostatic hypotension 04/08/2016  . Muscle cramps 04/08/2016  . 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 Cerebral thrombosis with cerebral infarction 04/06/2016  . Basilar artery stenosis   . Dysarthria 04/05/2016  . History of CVA with residual deficit 04/05/2016  . Sinus tachycardia 04/05/2016  . Acute encephalopathy  04/05/2016  . Chronic pain 04/05/2016  . Hyperlipidemia 04/05/2016  . Ischemic stroke (HCC)   . 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 TIA (transient ischemic attack) 11/07/2014  . Uncontrolled hypertension 11/07/2014  . DM II (diabetes mellitus, type II), controlled (HCC) 11/07/2014    [name], OTR/L 06/15/2016, 12:55 PM  [facility] [address] [place], [place], [address] Phone: [phone]   Fax:  [phone]  Name: Shelly Mcdaniel MRN: [id number] Date of Birth: 1956-04-12

## 2016-06-16 ENCOUNTER — Encounter: Payer: Self-pay | Admitting: Physical Medicine & Rehabilitation

## 2016-06-16 ENCOUNTER — Ambulatory Visit: Payer: BC Managed Care – PPO | Admitting: Rehabilitation

## 2016-06-16 ENCOUNTER — Encounter: Payer: BC Managed Care – PPO | Attending: Physical Medicine & Rehabilitation

## 2016-06-16 ENCOUNTER — Ambulatory Visit (HOSPITAL_BASED_OUTPATIENT_CLINIC_OR_DEPARTMENT_OTHER): Payer: BC Managed Care – PPO | Admitting: Physical Medicine & Rehabilitation

## 2016-06-16 ENCOUNTER — Ambulatory Visit: Payer: BC Managed Care – PPO | Admitting: *Deleted

## 2016-06-16 VITALS — BP 128/88 | HR 104

## 2016-06-16 DIAGNOSIS — R4182 Altered mental status, unspecified: Secondary | ICD-10-CM | POA: Insufficient documentation

## 2016-06-16 DIAGNOSIS — I69354 Hemiplegia and hemiparesis following cerebral infarction affecting left non-dominant side: Secondary | ICD-10-CM | POA: Insufficient documentation

## 2016-06-16 DIAGNOSIS — M7502 Adhesive capsulitis of left shoulder: Secondary | ICD-10-CM

## 2016-06-16 DIAGNOSIS — E785 Hyperlipidemia, unspecified: Secondary | ICD-10-CM | POA: Diagnosis not present

## 2016-06-16 DIAGNOSIS — I651 Occlusion and stenosis of basilar artery: Secondary | ICD-10-CM | POA: Diagnosis not present

## 2016-06-16 DIAGNOSIS — I10 Essential (primary) hypertension: Secondary | ICD-10-CM | POA: Insufficient documentation

## 2016-06-16 DIAGNOSIS — G8114 Spastic hemiplegia affecting left nondominant side: Secondary | ICD-10-CM

## 2016-06-16 DIAGNOSIS — Z794 Long term (current) use of insulin: Secondary | ICD-10-CM | POA: Diagnosis not present

## 2016-06-16 DIAGNOSIS — E1140 Type 2 diabetes mellitus with diabetic neuropathy, unspecified: Secondary | ICD-10-CM | POA: Insufficient documentation

## 2016-06-16 NOTE — Progress Notes (Signed)
Subjective:    Patient ID: Shelly Mcdaniel, female    DOB: 05-13-1956, 61 y.o.   MRN: [id number]  HPI Pt has had recurrent UTIs last treated ~1wk ago,ED visit on Christmas, received IV fluids as well as antibiotics. Started PT, OT [facility] outpatient neuro rehabilitation. Amb with PT only Has PCP visit today for blader urgency Sorting silverware as part of therapy Reviewed OT notes, left upper extremity pain is a concern, patient also continues to exhibit  cognitive deficits, for instance, patient told therapist that she had another stroke rather than a UTI.  Pain Inventory Average Pain 2 Pain Right Now 3 My pain is .  In the last 24 hours, has pain interfered with the following? General activity 8 Relation with others 8 Enjoyment of life 8 What TIME of day is your pain at its worst? night Sleep (in general) Fair  Pain is worse with: . Pain improves with: . Relief from Meds: 7  Mobility do you drive?  no use a wheelchair needs help with transfers  Function retired  Neuro/Psych bladder control problems confusion  Prior Studies Any changes since last visit?  no  Physicians involved in your care Any changes since last visit?  no   Family History  Problem Relation Age of Onset  . Stroke Mother   . Dementia Mother   . Diabetes Father   . Stroke Father   . CAD Father   . Diabetes Sister   . Diabetes Brother   . CAD Brother   . Stroke Brother   . Kidney failure Brother   . Hypertension Other   . Hyperlipidemia Other   . Stroke Other   . Heart disease Other   . Diabetes Other    Social History   Social History  . Marital status: Married    Spouse name: N/A  . Number of children: N/A  . Years of education: N/A   Social History Main Topics  . Smoking status: Never Smoker  . Smokeless tobacco: Never Used  . Alcohol use No  . Drug use: No  . Sexual activity: Not on file   Other Topics Concern  . Not on file   Social History Narrative  . No narrative  on file   Past Surgical History:  Procedure Laterality Date  . BACK SURGERY    . BREAST SURGERY    . COLONOSCOPY WITH PROPOFOL N/A 12/24/2013   Procedure: COLONOSCOPY WITH PROPOFOL;  Surgeon: Charolett BumpersMartin K Johnson, MD;  Location: [facility];  Service: Endoscopy;  Laterality: N/A;   Past Medical History:  Diagnosis Date  . Diabetes mellitus without complication (HCC)   . Hyperlipidemia   . Hypertension   . Stroke (HCC)   . UTI (urinary tract infection)    There were no vitals taken for this visit.  Opioid Risk Score:   Fall Risk Score:  `1  Depression screen PHQ 2/9  Depression screen [facility] 05/12/2016 01/15/2016 08/20/2015 05/27/2015  Decreased Interest 3 0 0 0  Down, Depressed, Hopeless 0 0 0 0  PHQ - 2 Score 3 0 0 0  Altered sleeping 2 - - -  Tired, decreased energy 3 - - -  Feeling bad or failure about yourself  0 - - -  Trouble concentrating 3 - - -  Moving slowly or fidgety/restless 2 - - -  Suicidal thoughts 0 - - -  PHQ-9 Score 13 - - -  Difficult doing work/chores Very difficult - - -   Review  of Systems  Constitutional: Negative.   HENT: Negative.   Eyes: Negative.   Respiratory: Negative.   Cardiovascular: Negative.   Gastrointestinal: Negative.   Endocrine: Negative.   Genitourinary: Positive for difficulty urinating.  Musculoskeletal: Positive for gait problem.  Skin: Negative.   Allergic/Immunologic: Negative.   Hematological: Negative.   Psychiatric/Behavioral: Positive for confusion.  All other systems reviewed and are negative.      Objective:   Physical Exam  Constitutional: She appears well-developed and well-nourished.  HENT:  Head: Normocephalic and atraumatic.  Eyes: Conjunctivae and EOM are normal. Pupils are equal, round, and reactive to light.  Neck: Normal range of motion.  Neurological: She is alert.  Left arm pronation, Ashworth3 Left Finger flexors, Ashworth grade 2-3  Motor strength trace finger flexion, 2 minus, elbow  flexion Trace left deltoid Left lower extremity. 2 minus, hip flexion, knee extension  Skin: Skin is warm and dry.  Psychiatric: She has a normal mood and affect. Thought content normal. Her speech is delayed. She is slowed. She exhibits abnormal recent memory. She is inattentive.  Nursing note and vitals reviewed.  Left shoulder has pain with external rotation as well as forward flexion and abduction. Left hand has no tenderness to palpation. No swelling or erythema. No skin color changes, no hyperhidrosis or hypohidrosis, temperature is normal       Assessment & Plan:  1. 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 Left shoulder pain, adhesive capsulitis, we'll do left shoulder injection, glenohumeral  Shoulder injection Left Glenohumeral Without ultrasound guidance)  Indication:Left Shoulder pain not relieved by medication management and other conservative care.  Informed consent was obtained after describing risks and benefits of the procedure with the patient, this includes bleeding, bruising, infection and medication side effects. The patient wishes to proceed and has given written consent. Patient was placed in a seated position. TheLeft shoulder was marked and prepped with betadine in the subacromial area. A 25-gauge 1-1/2 inch needle was inserted into the Glenohumeral joint. After negative draw back for blood, a solution containing 1 mL of 6 mg per ML betamethasone and 4 mL of 1% lidocaine was injected. A band aid was applied. The patient tolerated the procedure well. Post procedure instructions were given.

## 2016-06-16 NOTE — Patient Instructions (Signed)
Blood sugars may elevate for 2-3 days.  Next visit is for Botox, 100 units to left upper

## 2016-06-17 ENCOUNTER — Ambulatory Visit: Payer: BC Managed Care – PPO

## 2016-06-17 ENCOUNTER — Ambulatory Visit: Payer: BC Managed Care – PPO | Admitting: Rehabilitation

## 2016-06-17 ENCOUNTER — Encounter: Payer: Self-pay | Admitting: Occupational Therapy

## 2016-06-17 ENCOUNTER — Ambulatory Visit: Payer: BC Managed Care – PPO | Admitting: Occupational Therapy

## 2016-06-17 VITALS — BP 129/88

## 2016-06-17 DIAGNOSIS — R471 Dysarthria and anarthria: Secondary | ICD-10-CM

## 2016-06-17 DIAGNOSIS — R2681 Unsteadiness on feet: Secondary | ICD-10-CM

## 2016-06-17 DIAGNOSIS — R41842 Visuospatial deficit: Secondary | ICD-10-CM

## 2016-06-17 DIAGNOSIS — R293 Abnormal posture: Secondary | ICD-10-CM

## 2016-06-17 DIAGNOSIS — M6281 Muscle weakness (generalized): Secondary | ICD-10-CM

## 2016-06-17 DIAGNOSIS — I69354 Hemiplegia and hemiparesis following cerebral infarction affecting left non-dominant side: Secondary | ICD-10-CM

## 2016-06-17 DIAGNOSIS — M25512 Pain in left shoulder: Secondary | ICD-10-CM

## 2016-06-17 DIAGNOSIS — R41841 Cognitive communication deficit: Secondary | ICD-10-CM

## 2016-06-17 DIAGNOSIS — R278 Other lack of coordination: Secondary | ICD-10-CM

## 2016-06-17 NOTE — Therapy (Signed)
[facility] [address] [place], [place], [address] Phone: [phone]   Fax:  [phone]  Physical Therapy Treatment  Patient Details  Name: Shelly Mcdaniel MRN: [id number] Date of Birth: 1955-12-24 Referring Provider: Marvel Plan, MD and Claudette Laws, MD  Encounter Date: 06/17/2016      PT End of Session - 06/17/16 1711    Visit Number 2   Number of Visits 21   Date for PT Re-Evaluation 08/01/16   Authorization Type [employer] 0 visit limit, 0 auth   PT Start Time 1446   PT Stop Time 1530   PT Time Calculation (min) 44 min   Activity Tolerance Patient limited by pain;Treatment limited secondary to medical complications (Comment)  pain in LE, limited by BP   Behavior During Therapy [facility] for tasks assessed/performed      Past Medical History:  Diagnosis Date  . Diabetes mellitus without complication (HCC)   . Hyperlipidemia   . Hypertension   . Stroke (HCC)   . UTI (urinary tract infection)     Past Surgical History:  Procedure Laterality Date  . BACK SURGERY    . BREAST SURGERY    . COLONOSCOPY WITH PROPOFOL N/A 12/24/2013   Procedure: COLONOSCOPY WITH PROPOFOL;  Surgeon: Charolett Bumpers, MD;  Location: [facility];  Service: Endoscopy;  Laterality: N/A;    There were no vitals filed for this visit.      Subjective Assessment - 06/17/16 1454    Subjective I had an injection yesterday at Dr. Wynn Banker office.    Patient is accompained by: Family member   Limitations House hold activities;Walking   Patient Stated Goals "I want to work on my left extremity."    Currently in Pain? Yes   Pain Score 4    Pain Location Back   Pain Orientation Mid   Pain Descriptors / Indicators Aching   Pain Type Acute pain   Pain Onset More than a month ago   Pain Frequency Intermittent   Aggravating Factors  getting out of bed   Pain Relieving Factors Tyelenol and stretching          Postural Assessment Scale  for Stroke Patients (PASS)  Give the subject instructions for each item as written below. When scoring the item, record the lowest response category that applies for each item.  Maintaining a Posture  3__ 1. Sitting Without Support Instructions: Have the subject sit on a bench/mat without back support and with feet flat on the floor. (3) Can sit for 5 minutes without support (2) Can sit for more than 10 seconds without support (1) Can sit with slight support (for example, by 1 hand) (0) Cannot sit  _2_ 2. Standing With Support Instructions: Have the subject stand, providing support as needed. Evaluate only the ability to stand with or without support. Do not consider the quality of the stance. (3) Can stand with support of only 1 hand (2) Can stand with moderate support of 1 person (1) Can stand with strong support of 2 people (0) Cannot stand, even with support  _0_ 3. Standing Without Support Instructions: Have the subject stand without support. Evaluate only the ability to stand with or without support. Do not consider the quality of the stance. (3) Can stand without support for more than 1 minute and simultaneously perform arm movements at about shoulder level (2) Can stand without support for 1 minute or stands slightly asymmetrically  (1) Can stand without support for 10  seconds or leans heavily on 1 leg (0) Cannot stand without support  _0_ 4. Standing on Nonparetic Leg Instructions: Have the subject stand on the nonparetic leg. Evaluate only the ability to bear weight entirely on the nonparetic leg. Do not consider how the subject accomplishes the task. (3) Can stand on nonparetic leg for more than 10 seconds (2) Can stand on nonparetic leg for more than 5 seconds (1) Can stand on nonparetic leg for a few seconds (0) Cannot stand on nonparetic leg  _0_ 5. Standing on Paretic Leg Instructions: Have the subject stand on the paretic leg. Evaluate only the ability to bear  weight entirely on the paretic leg. Do not consider how the subject accomplishes the task. (3) Can stand on paretic leg for more than 10 seconds (2) Can stand on paretic leg for more than 5 seconds (1) Can stand on paretic leg for a few seconds (0) Cannot stand on paretic leg  Maintaining Posture SUBTOTAL __5______  Changing a Posture  _1_ 6. Supine to Paretic Side Lateral Instructions: Begin with the subject in supine on a treatment mat. Instruct the subject to roll to the paretic side (lateral movement). Assist as necessary. Evaluate the subject's performance on the amount of help required. Do not consider the quality of performance. (3) Can perform without help (2) Can perform with little help (1) Can perform with much help (0) Cannot perform  _2_ 7. Supine to Nonparetic Side Lateral Instructions: Begin with the subject in supine on a treatment mat. Instruct the subject to roll to the nonparetic side (lateral movement). Assist as necessary. Evaluate the subject's performance on the amount of help required. Do not consider the quality of performance. (3) Can perform without help (2) Can perform with little help (1) Can perform with much help (0) Cannot perform  _2_ 8. Supine to Sitting Up on the Edge of the Mat Instructions: Begin with the subject in supine on a treatment mat. Instruct the subject to come to sitting on the edge of the mat. Assist as necessary. Evaluate the subject's performance on the amount of help required. Do not consider the quality of performance. (3) Can perform without help (2) Can perform with little help (1) Can perform with much help (0) Cannot perform  _2_ 9. Sitting on the Edge of the Mat to Supine Instructions: Begin with the on the edge of a treatment mat. Instruct the subject to return to supine. Assist as necessary. Evaluate the subject's performance on the amount of help required. Do not consider the quality of performance. (3) Can perform without  help (2) Can perform with little help (1) Can perform with much help (0) Cannot perform  _2_ 10. Sitting to Standing Up Instructions: Begin with the subject sitting on the edge of a treatment mat. Instruct the subject to stand up without support. Assist if necessary. Evaluate the subject's performance on the amount of help required. Do not consider the quality of performance. (3) Can perform without help (2) Can perform with little help (1) Can perform with much help (0) Cannot perform  _2_ 11. Standing Up to Sitting Down Instructions: Begin with the subject standing by edge of a treatment mat. Instruct the subject to sit on edge of mat without support. Assist if necessary. Evaluate the subject's performance on the amount of help required. Do not consider the quality of performance. (3) Can perform without help (2) Can perform with little help (1) Can perform with much help (0) Cannot perform  _1_ 12. Standing, Picking Up a Pencil from the Floor Instructions: Begin with the subject standing. Instruct the subject to pick up a pencil fro the floor without support. Assist if necessary. Evaluate the subject's performance on the amount of help required. Do not consider the quality of performance. (3) Can perform without help (2) Can perform with little help (1) Can perform with much help (0) Cannot perform  Changing Posture SUBTOTAL _12____   TOTAL _17/36__                 [facility] Adult PT Treatment/Exercise - 06/17/16 0001      Transfers   Transfers Squat Pivot Transfers   Squat Pivot Transfers 4: Min assist;3: Mod assist   Squat Pivot Transfer Details (indicate cue type and reason) Addressed squat pivot transfers as she is able to do these more independently at home vs stand pivot at this time.  Performed several bouts of scooting to ensure proper foot placement and increased forward weight shift, however due to increased pain in low back,, requires increaased assist with  cues for foot placement, hand placement and increased forward trunk lean.       Ambulation/Gait   Ambulation/Gait Yes   Ambulation/Gait Assistance 1: +2 Total assist   Ambulation/Gait Assistance Details Attempted gait with LBQC, however due to increased low back pain, pt unable to take more than 2 steps and demonstrates increased instability, and was unable to straighten RLE>    Ambulation Distance (Feet) 2 Feet   Assistive device Large base quad cane   Gait Pattern Step-through pattern;Decreased stance time - left;Decreased hip/knee flexion - left;Decreased dorsiflexion - left;Lateral trunk lean to right;Decreased arm swing - left;Decreased weight shift to left;Narrow base of support;Left circumduction;Left genu recurvatum;Ataxic   Ambulation Surface Level;Indoor     Exercises   Other Exercises  briefly assessed pts SI joint with leg length discrepancy for pelvic innominance.  Note RLE longer in both supine and sitting, therefore performed muscle energy technique with RLE extension and LLE flexion (as best able) x 3 reps with 5 second holds.  This seemed to decrease pain per pt report.       Manual Therapy   Manual Therapy Taping   Kinesiotex Inhibit Muscle     Kinesiotix   Inhibit Muscle  Providing taping to low back between SI joints in star pattern to decrease pain and promote increased blood flow.                 PT Education - 06/17/16 1710    Education provided Yes   Education Details education on kinesiotape for low back    Person(s) Educated Patient;Spouse   Methods Explanation   Comprehension Verbalized understanding          PT Short Term Goals - 06/02/16 1905      PT SHORT TERM GOAL #1   Title Pt will initiate HEP in order to indicate improved functional mobility and decreased fall risk.  (Target Date: 06/30/16)   Time 4   Period Weeks   Status New     PT SHORT TERM GOAL #2   Title Pt will perform stand pivot transfer w/ LRAD at S level in order to  increase independence at home.     Time 4   Period Weeks   Status New     PT SHORT TERM GOAL #3   Title Pt will perform bed mobility at mod I level in order to indicate improved functional independence.  Time 4   Period Weeks   Status New     PT SHORT TERM GOAL #4   Title Pt will ambulate 100' w/ LRAD at min A level in order to indicate improved household ambulation.     Time 4   Period Weeks   Status New     PT SHORT TERM GOAL #5   Title Pt will perform PASS and improve score 3 points in order to indicate improved functional mobility.     Time 4   Period Weeks   Status New           PT Long Term Goals - 06/02/16 1910      PT LONG TERM GOAL #1   Title Pt/spouse will be independent with HEP in order to indicate improved functional mobility and decreased fall risk.  (Target Date: 07/28/16)   Time 8   Period Weeks   Status New     PT LONG TERM GOAL #2   Title Pt will perform stand pivot transfers w/ LRAD at mod I level in order to indicate improved functional independence.    Time 8   Period Weeks   Status New     PT LONG TERM GOAL #3   Title Pt will ambulate x 200' w/ LRAD at S level over indoor surfaces in order to increase independence with household ambulation.     Time 8   Period Weeks   Status New     PT LONG TERM GOAL #4   Title Pt will perform dynamic standing balance with intermittent UE support x 5 mins in order to increase independence with ADLs.    Time 8   Period Weeks   Status New     PT LONG TERM GOAL #5   Title Pt will ambulate over paved outdoor surfaces w/ LRAD x 200' at S level in order to indicate initiation of community ambulation.    Time 8   Period Weeks   Status New               Plan - 06/17/16 1711    Clinical Impression Statement Skilled session focused on assessment of balance with PASS with score of 17/36, indicative of elevated fall risk.  Also attempted gait, but due to low back pain (from lying back at home) was  unable to continue.  Performed muscle energy techniques and taping to low back to reduce pain.     Rehab Potential Good   Clinical Impairments Affecting Rehab Potential pt motivated by may be limited due to medical complexity and poor cognition   PT Frequency 2x / week  then 3x/wk for 4 weeks   PT Duration 4 weeks  then 3x/wk for 4 week   PT Treatment/Interventions ADLs/Self Care Home Management;Canalith Repostioning;Electrical Stimulation;DME Instruction;Stair training;Gait training;Functional mobility training;Therapeutic activities;Therapeutic exercise;Balance training;Neuromuscular re-education;Patient/family education;Orthotic Fit/Training;Passive range of motion;Energy conservation;Taping;Vestibular;Visual/perceptual remediation/compensation   PT Next Visit Plan Work on sit<>stand with pt and also with pt/husband, stand pivot transfers with device (also practice with pt and husband), gait with [facility] (will likely need give mohr sling), initiate SIMPLE HEP    Consulted and Agree with Plan of Care Patient;Family member/caregiver   Family Member Consulted Husband [name]      Patient will benefit from skilled therapeutic intervention in order to improve the following deficits and impairments:  Abnormal gait, Decreased activity tolerance, Decreased balance, Decreased cognition, Decreased coordination, Decreased endurance, Decreased knowledge of precautions, Decreased knowledge of use of DME, Decreased mobility, Decreased safety  awareness, Decreased strength, Impaired perceived functional ability, Impaired flexibility, Impaired sensation, Impaired tone, Impaired UE functional use, Improper body mechanics, Postural dysfunction  Visit Diagnosis: Hemiplegia and hemiparesis following cerebral infarction affecting left non-dominant side (HCC)  Muscle weakness (generalized)  Abnormal posture  Unsteadiness on feet     Problem List Patient Active Problem List   Diagnosis Date Noted  . Adhesive  capsulitis of left shoulder 06/16/2016  . Orthostatic hypotension 04/08/2016  . Muscle cramps 04/08/2016  . 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 Cerebral thrombosis with cerebral infarction 04/06/2016  . Basilar artery stenosis   . Dysarthria 04/05/2016  . History of CVA with residual deficit 04/05/2016  . Sinus tachycardia 04/05/2016  . Acute encephalopathy 04/05/2016  . Chronic pain 04/05/2016  . Hyperlipidemia 04/05/2016  . Ischemic stroke (HCC)   . 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 TIA (transient ischemic attack) 11/07/2014  . Uncontrolled hypertension 11/07/2014  . DM II (diabetes mellitus, type II), controlled (HCC) 11/07/2014    Harriet ButteEmily Bodi Palmeri, PT, MPT [facility] [address] [place], [place], [address] Phone: [phone]   Fax:  [phone] 06/17/16, 5:14 PM  Name: Wendie SimmerJody D Renner MRN: [id number] Date of Birth: [birth year]

## 2016-06-17 NOTE — Patient Instructions (Signed)
  Please complete the assigned speech therapy homework and return it to your next session.  

## 2016-06-17 NOTE — Therapy (Signed)
[facility] [address] [place], [place], [address] Phone: [phone]   Fax:  [phone]  Speech Language Pathology Treatment  Patient Details  Name: Shelly Mcdaniel MRN: [id number] Date of Birth: 1956/03/04 Referring Provider: Claudette Laws, MD  Encounter Date: 06/17/2016      End of Session - 06/17/16 1427    Visit Number 3   Number of Visits 17   Date for SLP Re-Evaluation 08/19/16   SLP Start Time 1323   SLP Stop Time  1400   SLP Time Calculation (min) 37 min   Activity Tolerance Patient tolerated treatment well      Past Medical History:  Diagnosis Date  . Diabetes mellitus without complication (HCC)   . Hyperlipidemia   . Hypertension   . Stroke (HCC)   . UTI (urinary tract infection)     Past Surgical History:  Procedure Laterality Date  . BACK SURGERY    . BREAST SURGERY    . COLONOSCOPY WITH PROPOFOL N/A 12/24/2013   Procedure: COLONOSCOPY WITH PROPOFOL;  Surgeon: Charolett Bumpers, MD;  Location: [facility];  Service: Endoscopy;  Laterality: N/A;    There were no vitals filed for this visit.      Subjective Assessment - 06/17/16 1331    Subjective Pt arrived 12 minutes late. Said she visited her PCP yesterday and did not recall seeing Dr. Wynn Banker, where she receive a shot in her shoulder. "Oh yah I did (see Kirstens), that hurt so much!" "I want my milkshake", as pt's husband entered room 10 minutes after pt.   Currently in Pain? Yes   Pain Score 1    Pain Location Arm   Pain Orientation Left   Pain Descriptors / Indicators Sore   Pain Type Other (Comment)  "it could be due to the injection"               ADULT SLP TREATMENT - 06/17/16 1335      General Information   Behavior/Cognition Alert;Cooperative;Pleasant mood;Distractible;Requires cueing     Treatment Provided   Treatment provided Cognitive-Linquistic     Cognitive-Linquistic Treatment   Treatment focused on  Cognition   Skilled Treatment SLP targeted sustained attention in novel cognitive linguistic tasks. Pt req min-mod A for sustaining attention occasionally after 30-90 seconds back to task. SLP varied task after 4 minutes and pt req'd more intense cueing to recall the current task. Pt deno'd decr'd selective attention by asking husband for a bite of a shake three times during the session until husband left ST room.     Assessment / Recommendations / Plan   Plan Continue with current plan of care     Progression Toward Goals   Progression toward goals Progressing toward goals            SLP Short Term Goals - 06/17/16 1338      SLP SHORT TERM GOAL #1   Title pt will demo sustained/selective attention in NOVEL therapy tasks in a min-mod noisy environment for 10 minutes, with rare min A back to task   Time 3   Period Weeks   Status On-going     SLP SHORT TERM GOAL #2   Title pt will demo awareness of errors in novel therapy tasks, 60% of the time, over three sessions   Time 3   Period Weeks   Status On-going     SLP SHORT TERM GOAL #3   Title pt will demo exercises for incr'd breath  support with occasional min A over three sessions   Time 3   Period Weeks   Status On-going          SLP Long Term Goals - 06/17/16 1339      SLP LONG TERM GOAL #1   Title pt will demo alternating attention with simple therapy tasks with rare min A needed for redirection/finding place back within the task   Time 7   Period Weeks   Status On-going     SLP LONG TERM GOAL #2   Title pt will exhibit emergent awareness in simple to mod complex NOVEL cognitive linguistic tasks 90% of the time over three sessions   Time 7   Period Weeks   Status On-going     SLP LONG TERM GOAL #3   Title pt will deom WNL breath support for multiple sentence responses in 10 minutes conversation over three sessions    Time 7   Period Weeks   Status On-going          Plan - 06/17/16 1427    Clinical  Impression Statement Pt continues to present today with deficits in cognitive-linguistics specifically attention skills and awareness. SLP's opinion is that these deficits affect other areas of cognitive linguistics such as problem solving, reasoning, executive function, and memory. Pt also demonstrated decr'd breath support resulting in low speech volume at the phrase level. She would beneift from skilled ST focusing on cognitive linguistics and dysarthria to decr caregiver burden and improve overall communication skills with family and community members.   Speech Therapy Frequency 2x / week   Duration --  8 weeks, or 16 therapy visits   Treatment/Interventions Patient/family education;Compensatory techniques;Internal/external aids;SLP instruction and feedback;Cognitive reorganization;Compensatory strategies;Oral motor exercises;Cueing hierarchy;Functional tasks  any or all may be used   Potential to Achieve Goals Good   Consulted and Agree with Plan of Care Patient;Family member/caregiver   Family Member Consulted husband      Patient will benefit from skilled therapeutic intervention in order to improve the following deficits and impairments:   Cognitive communication deficit  Dysarthria and anarthria    Problem List Patient Active Problem List   Diagnosis Date Noted  . 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 Orthostatic hypotension 04/08/2016  . Muscle cramps 04/08/2016  . 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 Cerebral thrombosis with cerebral infarction 04/06/2016  . Basilar artery stenosis   . Dysarthria 04/05/2016  . History of CVA with residual deficit 04/05/2016  . Sinus tachycardia 04/05/2016  . Acute encephalopathy 04/05/2016  . Chronic pain 04/05/2016  . Hyperlipidemia 04/05/2016  . Ischemic stroke (HCC)   . 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 TIA (transient ischemic attack) 11/07/2014   . Uncontrolled hypertension 11/07/2014  . DM II (diabetes mellitus, type II), controlled (HCC) 11/07/2014    Rodolfo Notaro ,MS, CCC-SLP  06/17/2016, 2:29 PM  [facility] [address] [place], [place], [address] Phone: [phone]   Fax:  [phone]   Name: Shelly Mcdaniel MRN: [id number] Date of Birth: 13-May-1956

## 2016-06-17 NOTE — Therapy (Signed)
[facility] [address] [place], [place], [address] Phone: [phone]   Fax:  [phone]  Occupational Therapy Treatment  Patient Details  Name: Shelly Mcdaniel MRN: [id number] Date of Birth: December 03, 1955 Referring Provider: Dr. Claudette Laws  Encounter Date: 06/17/2016      OT End of Session - 06/17/16 1618    Visit Number 3   Number of Visits 25   Date for OT Re-Evaluation 07/30/16   Authorization Type BC/BS   OT Start Time 1405   OT Stop Time 1445   OT Time Calculation (min) 40 min   Activity Tolerance Patient tolerated treatment well   Behavior During Therapy [facility] for tasks assessed/performed      Past Medical History:  Diagnosis Date  . Diabetes mellitus without complication (HCC)   . Hyperlipidemia   . Hypertension   . Stroke (HCC)   . UTI (urinary tract infection)     Past Surgical History:  Procedure Laterality Date  . BACK SURGERY    . BREAST SURGERY    . COLONOSCOPY WITH PROPOFOL N/A 12/24/2013   Procedure: COLONOSCOPY WITH PROPOFOL;  Surgeon: Charolett Bumpers, MD;  Location: [facility];  Service: Endoscopy;  Laterality: N/A;    Vitals:   06/17/16 1412  BP: 129/88        Subjective Assessment - 06/17/16 1412    Subjective  The injection really helped.     Patient is accompained by: Family member   Pertinent History see Epic, Dr Roda Shutters has approved DBP less than 110 and HR 115-120 for therapy parameters.    Limitations elevated BP and orthostatic hypotension at times, monitor BP   Patient Stated Goals to be more indpendent   Currently in Pain? Yes   Pain Score 1    Pain Location Arm   Pain Orientation Left   Pain Descriptors / Indicators Sore   Pain Type Chronic pain   Pain Onset More than a month ago   Pain Frequency Intermittent   Aggravating Factors  poor postural control   Pain Relieving Factors meds, realignment                      OT Treatments/Exercises (OP)  - 06/17/16 0001      Neurological Re-education Exercises   Other Exercises 1 Neuromuscular reeducation to address  postural control, left shoulder range of motion and shoulder pain.  Patient without pain at beginning of session, and reports decreased shoulder pain overall since injection yesterday.  Patient with poor postural control , sitting in flexed position with strong bias posterior and to right side.  Patient flinches with attempts to bring her toward left, although consistently reports decreased pain when not pulling to right.  In supine provided gentle range of motion, especially in increasing ranges of flexion and external rotation.  Patient with very limited ability to access elbow flexion / extension this session.                  OT Education - 06/17/16 1618    Education provided Yes   Education Details reviewed the importance of bed positioning   Person(s) Educated Patient   Methods Explanation   Comprehension Need further instruction          OT Short Term Goals - 06/15/16 1250      OT SHORT TERM GOAL #1   Title Pt/family independent with initial HEP for LUE (due 06/29/16)   Time 4   Period  Weeks   Status On-going     OT SHORT TERM GOAL #2   Title Pt/family to verbalize understanding with proper positioning of LUE during activity and in bed to prevent pain including use of sling /splint prn   Time 4   Period Weeks   Status On-going     OT SHORT TERM GOAL #3   Title Pt to perform UB dressing with min assist using hemi techniques   Time 4   Period Weeks   Status On-going     OT SHORT TERM GOAL #4   Title Pt will donn pants with mod A.   Time 4   Period Weeks   Status On-going     OT SHORT TERM GOAL #5   Title Pt will use LUE as a stabilizer with min A 25 % of the time for ADLS/ functional activity   Time 4   Period Weeks   Status On-going     OT SHORT TERM GOAL #6   Title Pt will perform bathing with min A for UB and mod A for LB   Time 4    Period Weeks   Status On-going           OT Long Term Goals - 06/15/16 1250      OT LONG TERM GOAL #1   Title Independent with updated HEP prn (all LTG's due 07/30/15)   Time 8   Period Weeks   Status On-going     OT LONG TERM GOAL #2   Title Pt to demo active low range sh. flexion to 25 degrees in prep for reaching and use of arm as stabilizer   Time 8   Period Weeks   Status On-going     OT LONG TERM GOAL #3   Title Pt to demo 25% gross finger flexion/extension to grasp/release cylindrical objects   Time 8   Period Weeks   Status On-going     OT LONG TERM GOAL #4   Title Pt will use LUE as a stabilizer for ADLs with min v.c. and pain less than or equal to 3/10   Time 8   Period Weeks   Status On-going     OT LONG TERM GOAL #5   Title Pt will perfrom toilet and shower transfers with min A.   Time 8   Period Weeks   Status On-going               Plan - 06/17/16 1619    Clinical Impression Statement Patient with improvement in shoulder pain,and blood pressure has been more stable recently.  Patient with significant cognitive deficits, which impede carryover of therapeutic techniques.   Rehab Potential Fair   OT Frequency 3x / week   OT Duration 8 weeks   OT Treatment/Interventions Self-care/ADL training;Moist Heat;DME and/or AE instruction;Splinting;Patient/family education;Therapeutic exercises;Compression bandaging;Therapeutic activities;Neuromuscular education;Functional Mobility Training;Passive range of motion;Cognitive remediation/compensation;Electrical Stimulation;Manual Therapy;Dry needling;Visual/perceptual remediation/compensation   Plan monitor BP, trunk/LUE NMR   Consulted and Agree with Plan of Care Patient      Patient will benefit from skilled therapeutic intervention in order to improve the following deficits and impairments:  Decreased activity tolerance, Decreased balance, Decreased cognition, Decreased knowledge of use of DME, Decreased  coordination, Decreased endurance, Decreased range of motion, Decreased safety awareness, Decreased skin integrity, Decreased strength, Impaired perceived functional ability, Increased edema, Impaired sensation, Impaired tone, Impaired UE functional use, Impaired vision/preception, Pain, Improper body mechanics, Decreased mobility, Abnormal gait, Difficulty walking, Decreased knowledge of precautions  Visit Diagnosis: Hemiplegia and hemiparesis following cerebral infarction affecting left non-dominant side (HCC)  Muscle weakness (generalized)  Abnormal posture  Unsteadiness on feet  Acute pain of left shoulder  Other lack of coordination  Visuospatial deficit    Problem List Patient Active Problem List   Diagnosis Date Noted  . 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 Orthostatic hypotension 04/08/2016  . Muscle cramps 04/08/2016  . 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 Cerebral thrombosis with cerebral infarction 04/06/2016  . Basilar artery stenosis   . Dysarthria 04/05/2016  . History of CVA with residual deficit 04/05/2016  . Sinus tachycardia 04/05/2016  . Acute encephalopathy 04/05/2016  . Chronic pain 04/05/2016  . Hyperlipidemia 04/05/2016  . Ischemic stroke (HCC)   . 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 TIA (transient ischemic attack) 11/07/2014  . Uncontrolled hypertension 11/07/2014  . DM II (diabetes mellitus, type II), controlled (HCC) 11/07/2014    Collier Salina, OTR/L 06/17/2016, 4:23 PM  [place] [facility] [address] [place], [place], [address] Phone: [phone]   Fax:  [phone]  Name: INETTE DOUBRAVA MRN: [id number] Date of Birth: [birth year]

## 2016-06-20 ENCOUNTER — Ambulatory Visit: Payer: BC Managed Care – PPO | Admitting: Speech Pathology

## 2016-06-20 ENCOUNTER — Encounter: Payer: Self-pay | Admitting: Speech Pathology

## 2016-06-20 ENCOUNTER — Ambulatory Visit: Payer: BC Managed Care – PPO | Admitting: Rehabilitation

## 2016-06-20 ENCOUNTER — Encounter: Payer: Self-pay | Admitting: Rehabilitation

## 2016-06-20 DIAGNOSIS — R471 Dysarthria and anarthria: Secondary | ICD-10-CM

## 2016-06-20 DIAGNOSIS — M6281 Muscle weakness (generalized): Secondary | ICD-10-CM

## 2016-06-20 DIAGNOSIS — R293 Abnormal posture: Secondary | ICD-10-CM

## 2016-06-20 DIAGNOSIS — I69354 Hemiplegia and hemiparesis following cerebral infarction affecting left non-dominant side: Secondary | ICD-10-CM

## 2016-06-20 DIAGNOSIS — R41841 Cognitive communication deficit: Secondary | ICD-10-CM

## 2016-06-20 DIAGNOSIS — R2681 Unsteadiness on feet: Secondary | ICD-10-CM

## 2016-06-20 NOTE — Therapy (Signed)
[facility] [address] [place], [place], [address] Phone: [phone]   Fax:  [phone]  Physical Therapy Treatment  Patient Details  Name: Shelly SimmerJody D Mcdaniel MRN: [id number] Date of Birth: 01-Jun-1956 Referring Provider: Marvel PlanJindong Xu, MD and Claudette LawsAndrew Kirsteins, MD  Encounter Date: 06/20/2016      PT End of Session - 06/20/16 1957    Visit Number 3   Number of Visits 21   Date for PT Re-Evaluation 08/01/16   Authorization Type BCBS 0 visit limit, 0 auth   PT Start Time 1148   PT Stop Time 1231   PT Time Calculation (min) 43 min   Activity Tolerance Patient limited by pain;Treatment limited secondary to medical complications (Comment)  pain in LE, limited by BP   Behavior [facility] for tasks assessed/performed      Past Medical History:  Diagnosis Date  . Diabetes mellitus without complication (HCC)   . Hyperlipidemia   . Hypertension   . Stroke (HCC)   . UTI (urinary tract infection)     Past Surgical History:  Procedure Laterality Date  . BACK SURGERY    . BREAST SURGERY    . COLONOSCOPY WITH PROPOFOL N/A 12/24/2013   Procedure: COLONOSCOPY WITH PROPOFOL;  Surgeon: Charolett BumpersMartin K Johnson, MD;  Location: [facility];  Service: Endoscopy;  Laterality: N/A;    There were no vitals filed for this visit.      Subjective Assessment - 06/20/16 1948    Subjective Per husband, pt has not been able to walk a lot at home.  Following session, PT educated to allow more practice prior to doing at home.     Patient is accompained by: Family member   Limitations House hold activities;Walking   Patient Stated Goals "I want to work on my left extremity."    Currently in Pain? No/denies                         [facility] Adult [facility] Treatment/Exercise - 06/20/16 0001      Transfers   Transfers Squat Pivot Transfers   Squat Pivot Transfers 4: Min guard;4: Min Development worker, communityassist   Squat Pivot Transfer Details (indicate  cue type and reason) Most of session focused on functional squat pivot transfers and carryover at home.  Performed x 3 reps w/c<>mat with cues for foot placement, scooting forward and increased forward weight shift to allow increased WB through LEs.  Remainder of transfers were set up to simulate home with bed at 28" <>simulated bedside commode.  Note that they have small 4" step with wide base that they use to get into/out of car, therfore simulated this during session and note good carryover with practice x 4 reps with cues for sequencing for pt as well as safety and body mechanics for husband.  Educated on bed mobility and that PT would like for pt to eventually get rid of bed rail for increased independence.       Ambulation/Gait   Ambulation/Gait Yes   Ambulation/Gait Assistance 3: Mod assist   Ambulation/Gait Assistance Details Worked on gait with use of L PFRW x 25' x 1 and another 20' x 1 with min to mod A, cues for posture, increased step width, esp on LLE and safety when turning.  note increased L knee genu recurvatum, therefore PT placed small heel wedge under brace with noted improvemnet in recurvatum.     Assistive device Left platform walker   Gait  Pattern Step-through pattern;Decreased stance time - left;Decreased hip/knee flexion - left;Decreased dorsiflexion - left;Lateral trunk lean to right;Decreased arm swing - left;Decreased weight shift to left;Narrow base of support;Left circumduction;Left genu recurvatum;Ataxic   Ambulation Surface Level;Indoor                PT Education - 06/20/16 1955    Education provided Yes   Education Details using platform under pts feet for transfers to/from bed (also to add traction strips for safety), not walking at home until can practice more in therapy.     Person(s) Educated Patient;Spouse   Methods Explanation;Demonstration   Comprehension Verbalized understanding;Returned demonstration          PT Short Term Goals - 06/02/16  1905      PT SHORT TERM GOAL #1   Title Pt will initiate HEP in order to indicate improved functional mobility and decreased fall risk.  (Target Date: 06/30/16)   Time 4   Period Weeks   Status New     PT SHORT TERM GOAL #2   Title Pt will perform stand pivot transfer w/ LRAD at S level in order to increase independence at home.     Time 4   Period Weeks   Status New     PT SHORT TERM GOAL #3   Title Pt will perform bed mobility at mod I level in order to indicate improved functional independence.     Time 4   Period Weeks   Status New     PT SHORT TERM GOAL #4   Title Pt will ambulate 100' w/ LRAD at min A level in order to indicate improved household ambulation.     Time 4   Period Weeks   Status New     PT SHORT TERM GOAL #5   Title Pt will perform PASS and improve score 3 points in order to indicate improved functional mobility.     Time 4   Period Weeks   Status New           PT Long Term Goals - 06/02/16 1910      PT LONG TERM GOAL #1   Title Pt/spouse will be independent with HEP in order to indicate improved functional mobility and decreased fall risk.  (Target Date: 07/28/16)   Time 8   Period Weeks   Status New     PT LONG TERM GOAL #2   Title Pt will perform stand pivot transfers w/ LRAD at mod I level in order to indicate improved functional independence.    Time 8   Period Weeks   Status New     PT LONG TERM GOAL #3   Title Pt will ambulate x 200' w/ LRAD at S level over indoor surfaces in order to increase independence with household ambulation.     Time 8   Period Weeks   Status New     PT LONG TERM GOAL #4   Title Pt will perform dynamic standing balance with intermittent UE support x 5 mins in order to increase independence with ADLs.    Time 8   Period Weeks   Status New     PT LONG TERM GOAL #5   Title Pt will ambulate over paved outdoor surfaces w/ LRAD x 200' at S level in order to indicate initiation of community ambulation.    Time  8   Period Weeks   Status New  Plan - 06/20/16 1957    Clinical Impression Statement Skilled session focused on functional squat pivot transfers, set up as in her home, and gait with L PFRW.  Tolerated well, but would like to continue practice with pt and husband before walking at home.     Rehab Potential Good   Clinical Impairments Affecting Rehab Potential pt motivated by may be limited due to medical complexity and poor cognition   PT Frequency 2x / week  then 3x/wk for 4 weeks   PT Duration 4 weeks  then 3x/wk for 4 week   PT Treatment/Interventions ADLs/Self Care Home Management;Canalith Repostioning;Electrical Stimulation;DME Instruction;Stair training;Gait training;Functional mobility training;Therapeutic activities;Therapeutic exercise;Balance training;Neuromuscular re-education;Patient/family education;Orthotic Fit/Training;Passive range of motion;Energy conservation;Taping;Vestibular;Visual/perceptual remediation/compensation   PT Next Visit Plan Work on sit<>stand with pt and also with pt/husband, stand pivot transfers with device when able (also practice with pt and husband), gait with L PFRW, initiate SIMPLE HEP    Consulted and Agree with Plan of Care Patient;Family member/caregiver   Family Member Consulted Husband [name]      Patient will benefit from skilled therapeutic intervention in order to improve the following deficits and impairments:  Abnormal gait, Decreased activity tolerance, Decreased balance, Decreased cognition, Decreased coordination, Decreased endurance, Decreased knowledge of precautions, Decreased knowledge of use of DME, Decreased mobility, Decreased safety awareness, Decreased strength, Impaired perceived functional ability, Impaired flexibility, Impaired sensation, Impaired tone, Impaired UE functional use, Improper body mechanics, Postural dysfunction  Visit Diagnosis: Hemiplegia and hemiparesis following cerebral infarction affecting  left non-dominant side (HCC)  Muscle weakness (generalized)  Abnormal posture  Unsteadiness on feet     Problem List Patient Active Problem List   Diagnosis Date Noted  . 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 Orthostatic hypotension 04/08/2016  . Muscle cramps 04/08/2016  . 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 Cerebral thrombosis with cerebral infarction 04/06/2016  . Basilar artery stenosis   . Dysarthria 04/05/2016  . History of CVA with residual deficit 04/05/2016  . Sinus tachycardia 04/05/2016  . Acute encephalopathy 04/05/2016  . Chronic pain 04/05/2016  . Hyperlipidemia 04/05/2016  . Ischemic stroke (HCC)   . 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 TIA (transient ischemic attack) 11/07/2014  . Uncontrolled hypertension 11/07/2014  . DM II (diabetes mellitus, type II), controlled (HCC) 11/07/2014    Harriet Butte, PT, MPT [facility] [address] [place], [place], [address] Phone: [phone]   Fax:  [phone] 06/20/16, 8:00 PM  Name: Shelly Mcdaniel MRN: [id number] Date of Birth: 01-Mar-1956

## 2016-06-20 NOTE — Therapy (Signed)
[facility] [address] [place], [place], [address] Phone: [phone]   Fax:  [phone]  Speech Language Pathology Treatment  Patient Details  Name: Shelly SimmerJody D Cabana MRN: [id number] Date of Birth: [birth year] Referring Provider: Claudette LawsKirsteins, Andrew, MD  Encounter Date: 06/20/2016      End of Session - 06/20/16 1108    Visit Number 4   Number of Visits 17   Date for SLP Re-Evaluation 08/19/16   SLP Start Time 1027   SLP Stop Time  1105   SLP Time Calculation (min) 38 min   Activity Tolerance Patient tolerated treatment well      Past Medical History:  Diagnosis Date  . Diabetes mellitus without complication (HCC)   . Hyperlipidemia   . Hypertension   . Stroke (HCC)   . UTI (urinary tract infection)     Past Surgical History:  Procedure Laterality Date  . BACK SURGERY    . BREAST SURGERY    . COLONOSCOPY WITH PROPOFOL N/A 12/24/2013   Procedure: COLONOSCOPY WITH PROPOFOL;  Surgeon: Charolett BumpersMartin K Johnson, MD;  Location: [facility];  Service: Endoscopy;  Laterality: N/A;    There were no vitals filed for this visit.      Subjective Assessment - 06/20/16 1308    Subjective Patient arrived with husband. When questioned by therapist she is unable to recall specific details regarding reasons for visit.   Currently in Pain? No/denies               ADULT SLP TREATMENT - 06/20/16 0001      General Information   Behavior/Cognition Alert;Cooperative;Pleasant mood;Distractible;Requires cueing   Patient Positioning Upright in chair  wheelchair   Oral care provided N/A     Treatment Provided   Treatment provided Cognitive-Linquistic     Cognitive-Linquistic Treatment   Treatment focused on Cognition   Skilled Treatment Treatment session targeted sustained attention via novel cognitive linguistic tasks. During activity of alternating naming of months and associated holidays, patient initially required  mod-max cues to attend to task instructions. She benefitted from use of a calendar to improve focus. She required less frequent cues to maintain attention to appropriate month as the task progressed. During novel category naming tasks, patient stated "I'm not naming normal things." With max cues, she is able to state difficulties may related to decreased "focus". Patient demonstrated improving awareness as evidenced by self-monitoring/correction during monetary tasks.      Assessment / Recommendations / Plan   Plan Continue with current plan of care     Progression Toward Goals   Progression toward goals Progressing toward goals          SLP Education - 06/20/16 1310    Education provided Yes   Education Details tasks to improve attention   Person(s) Educated Patient   Methods Explanation   Comprehension Verbalized understanding;Need further instruction          SLP Short Term Goals - 06/20/16 1128      SLP SHORT TERM GOAL #1   Title pt will demo sustained/selective attention in NOVEL therapy tasks in a min-mod noisy environment for 10 minutes, with rare min A back to task   Time 3   Period Weeks   Status On-going     SLP SHORT TERM GOAL #2   Title pt will demo awareness of errors in novel therapy tasks, 60% of the time, over three sessions   Time 3   Period Weeks   Status On-going  SLP SHORT TERM GOAL #3   Title pt will demo exercises for incr'd breath support with occasional min A over three sessions   Time 3   Period Weeks   Status On-going          SLP Long Term Goals - 06/20/16 1130      SLP LONG TERM GOAL #1   Title pt will demo alternating attention with simple therapy tasks with rare min A needed for redirection/finding place back within the task   Time 7   Period Weeks   Status On-going     SLP LONG TERM GOAL #2   Title pt will exhibit emergent awareness in simple to mod complex NOVEL cognitive linguistic tasks 90% of the time over three sessions    Time 7   Period Weeks   Status On-going     SLP LONG TERM GOAL #3   Title pt will deom WNL breath support for multiple sentence responses in 10 minutes conversation over three sessions    Time 7   Period Weeks   Status On-going          Plan - 06/20/16 1126    Clinical Impression Statement Pt continues to present today with deficits in cognitive-linguistics specifically attention skills and awareness. SLP's opinion is that these deficits affect other areas of cognitive linguistics such as problem solving, reasoning, executive function, and memory. Pt also demonstrated decr'd breath support resulting in low speech volume at the phrase level. She would beneift from skilled ST focusing on cognitive linguistics and dysarthria to decr caregiver burden and improve overall communication skills with family and community members.   Speech Therapy Frequency 2x / week   Duration --  8 weeks, or 16 therapy visits   Treatment/Interventions Patient/family education;Compensatory techniques;Internal/external aids;SLP instruction and feedback;Cognitive reorganization;Compensatory strategies;Oral motor exercises;Cueing hierarchy;Functional tasks  any or all may be used   Potential to Achieve Goals Good   Consulted and Agree with Plan of Care Patient;Family member/caregiver   Family Member Consulted husband      Patient will benefit from skilled therapeutic intervention in order to improve the following deficits and impairments:   Cognitive communication deficit  Dysarthria and anarthria    Problem List Patient Active Problem List   Diagnosis Date Noted  . 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 Orthostatic hypotension 04/08/2016  . Muscle cramps 04/08/2016  . 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 Cerebral thrombosis with cerebral infarction 04/06/2016  . Basilar artery stenosis   . Dysarthria  04/05/2016  . History of CVA with residual deficit 04/05/2016  . Sinus tachycardia 04/05/2016  . Acute encephalopathy 04/05/2016  . Chronic pain 04/05/2016  . Hyperlipidemia 04/05/2016  . Ischemic stroke (HCC)   . 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 TIA (transient ischemic attack) 11/07/2014  . Uncontrolled hypertension 11/07/2014  . DM II (diabetes mellitus, type II), controlled (HCC) 11/07/2014   Rondel Baton, MS CF-SLP Speech-Language Pathologist  Arlana Lindau 06/20/2016, 1:13 PM  [place] [facility] [address] [place], [place], [address] Phone: [phone]   Fax:  [phone]   Name: RAKISHA PINCOCK MRN: [id number] Date of Birth: 1955/08/18

## 2016-06-21 ENCOUNTER — Ambulatory Visit: Payer: BC Managed Care – PPO | Admitting: *Deleted

## 2016-06-21 ENCOUNTER — Ambulatory Visit: Payer: BC Managed Care – PPO | Admitting: Occupational Therapy

## 2016-06-21 VITALS — BP 134/90

## 2016-06-21 DIAGNOSIS — I69354 Hemiplegia and hemiparesis following cerebral infarction affecting left non-dominant side: Secondary | ICD-10-CM

## 2016-06-21 DIAGNOSIS — M25512 Pain in left shoulder: Secondary | ICD-10-CM

## 2016-06-21 DIAGNOSIS — M6281 Muscle weakness (generalized): Secondary | ICD-10-CM

## 2016-06-21 DIAGNOSIS — R293 Abnormal posture: Secondary | ICD-10-CM

## 2016-06-21 DIAGNOSIS — R41842 Visuospatial deficit: Secondary | ICD-10-CM

## 2016-06-21 DIAGNOSIS — R41841 Cognitive communication deficit: Secondary | ICD-10-CM

## 2016-06-21 DIAGNOSIS — R278 Other lack of coordination: Secondary | ICD-10-CM

## 2016-06-21 NOTE — Therapy (Signed)
[facility] [address] [place], [place], [address] Phone: [phone]   Fax:  [phone]  Occupational Therapy Treatment  Patient Details  Name: Shelly Mcdaniel MRN: [id number] Date of Birth: 1955-08-16 Referring Provider: Dr. Claudette Laws  Encounter Date: 06/21/2016      OT End of Session - 06/21/16 1723    Visit Number 4   Number of Visits 25   Date for OT Re-Evaluation 07/30/16   Authorization Type BC/BS   OT Start Time 1406   OT Stop Time 1445   OT Time Calculation (min) 39 min      Past Medical History:  Diagnosis Date  . Diabetes mellitus without complication (HCC)   . Hyperlipidemia   . Hypertension   . Stroke (HCC)   . UTI (urinary tract infection)     Past Surgical History:  Procedure Laterality Date  . BACK SURGERY    . BREAST SURGERY    . COLONOSCOPY WITH PROPOFOL N/A 12/24/2013   Procedure: COLONOSCOPY WITH PROPOFOL;  Surgeon: Charolett Bumpers, MD;  Location: [facility];  Service: Endoscopy;  Laterality: N/A;    Vitals:   06/21/16 1410  BP: 134/90        Subjective Assessment - 06/21/16 1534    Subjective  The injection helped her shoulder pain   Pertinent History see Epic, Dr Roda Shutters has approved DBP less than 110 and HR 115-120 for therapy parameters.    Limitations elevated BP and orthostatic hypotension at times, monitor BP   Patient Stated Goals to be more indpendent   Currently in Pain? Yes   Pain Score 4    Pain Location Arm   Pain Descriptors / Indicators Aching   Pain Type Chronic pain   Pain Onset More than a month ago   Pain Frequency Intermittent   Aggravating Factors  malpositioning   Pain Relieving Factors repositioning   Multiple Pain Sites No               Neurological Re-education Exercises  Other Exercises 1 Neuromuscular reeducation to address trunk control  left shoulder range of motion and shoulder pain.  Patient demonstrates hypersensitivity  with most any movements and touching LUE  In supine, sidelying   gentle range of motion, especially in increasing ranges of flexion and external rotation, scapular mobs and lower/ upper trunk rotation as tolerated. Pt transferred w/c to mat with minguard..                      OT Short Term Goals - 06/15/16 1250      OT SHORT TERM GOAL #1   Title Pt/family independent with initial HEP for LUE (due 06/29/16)   Time 4   Period Weeks   Status On-going     OT SHORT TERM GOAL #2   Title Pt/family to verbalize understanding with proper positioning of LUE during activity and in bed to prevent pain including use of sling /splint prn   Time 4   Period Weeks   Status On-going     OT SHORT TERM GOAL #3   Title Pt to perform UB dressing with min assist using hemi techniques   Time 4   Period Weeks   Status On-going     OT SHORT TERM GOAL #4   Title Pt will donn pants with mod A.   Time 4   Period Weeks   Status On-going     OT SHORT TERM GOAL #5  Title Pt will use LUE as a stabilizer with min A 25 % of the time for ADLS/ functional activity   Time 4   Period Weeks   Status On-going     OT SHORT TERM GOAL #6   Title Pt will perform bathing with min A for UB and mod A for LB   Time 4   Period Weeks   Status On-going           OT Long Term Goals - 06/15/16 1250      OT LONG TERM GOAL #1   Title Independent with updated HEP prn (all LTG's due 07/30/15)   Time 8   Period Weeks   Status On-going     OT LONG TERM GOAL #2   Title Pt to demo active low range sh. flexion to 25 degrees in prep for reaching and use of arm as stabilizer   Time 8   Period Weeks   Status On-going     OT LONG TERM GOAL #3   Title Pt to demo 25% gross finger flexion/extension to grasp/release cylindrical objects   Time 8   Period Weeks   Status On-going     OT LONG TERM GOAL #4   Title Pt will use LUE as a stabilizer for ADLs with min v.c. and pain less than or equal to 3/10   Time  8   Period Weeks   Status On-going     OT LONG TERM GOAL #5   Title Pt will perfrom toilet and shower transfers with min A.   Time 8   Period Weeks   Status On-going               Plan - 06/21/16 1720    Clinical Impression Statement Pt is progressing slowly towards goals. She remains limited by pain and hypersensitivitiy in her RUE   Rehab Potential Fair   OT Frequency 3x / week   OT Duration 8 weeks   OT Treatment/Interventions Self-care/ADL training;Moist Heat;DME and/or AE instruction;Splinting;Patient/family education;Therapeutic exercises;Compression bandaging;Therapeutic activities;Neuromuscular education;Functional Mobility Training;Passive range of motion;Cognitive remediation/compensation;Electrical Stimulation;Manual Therapy;Dry needling;Visual/perceptual remediation/compensation   Plan trunk and LUE NMR, monitor BP   Consulted and Agree with Plan of Care Patient      Patient will benefit from skilled therapeutic intervention in order to improve the following deficits and impairments:  Decreased activity tolerance, Decreased balance, Decreased cognition, Decreased knowledge of use of DME, Decreased coordination, Decreased endurance, Decreased range of motion, Decreased safety awareness, Decreased skin integrity, Decreased strength, Impaired perceived functional ability, Increased edema, Impaired sensation, Impaired tone, Impaired UE functional use, Impaired vision/preception, Pain, Improper body mechanics, Decreased mobility, Abnormal gait, Difficulty walking, Decreased knowledge of precautions  Visit Diagnosis: Hemiplegia and hemiparesis following cerebral infarction affecting left non-dominant side (HCC)  Abnormal posture  Muscle weakness (generalized)  Acute pain of left shoulder  Other lack of coordination  Visuospatial deficit    Problem List Patient Active Problem List   Diagnosis Date Noted  . 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  Orthostatic hypotension 04/08/2016  . Muscle cramps 04/08/2016  . 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 Cerebral thrombosis with cerebral infarction 04/06/2016  . Basilar artery stenosis   . Dysarthria 04/05/2016  . History of CVA with residual deficit 04/05/2016  . Sinus tachycardia 04/05/2016  . Acute encephalopathy 04/05/2016  . Chronic pain 04/05/2016  . Hyperlipidemia 04/05/2016  . Ischemic stroke (HCC)   .  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 TIA (transient ischemic attack) 11/07/2014  . Uncontrolled hypertension 11/07/2014  . DM II (diabetes mellitus, type II), controlled (HCC) 11/07/2014    RINE,KATHRYN 06/21/2016, 5:24 PM  [facility] [address] [place], [place], [address] Phone: [phone]   Fax:  [phone]  Name: [name] MRN: [id number] Date of Birth: 30-Dec-1955

## 2016-06-21 NOTE — Therapy (Signed)
[facility] [address] [place], [place], [address] Phone: [phone]   Fax:  [phone]  Speech Language Pathology Treatment  Patient Details  Name: Shelly Mcdaniel MRN: [id number] Date of Birth: 11-22-1955 Referring Provider: Claudette Laws, MD  Encounter Date: 06/21/2016      End of Session - 06/21/16 1540    Visit Number 5   Number of Visits 16   Date for SLP Re-Evaluation 08/19/16   SLP Start Time 1317   SLP Stop Time  1402   SLP Time Calculation (min) 45 min   Activity Tolerance Patient tolerated treatment well      Past Medical History:  Diagnosis Date  . Diabetes mellitus without complication (HCC)   . Hyperlipidemia   . Hypertension   . Stroke (HCC)   . UTI (urinary tract infection)     Past Surgical History:  Procedure Laterality Date  . BACK SURGERY    . BREAST SURGERY    . COLONOSCOPY WITH PROPOFOL N/A 12/24/2013   Procedure: COLONOSCOPY WITH PROPOFOL;  Surgeon: Charolett Bumpers, MD;  Location: [facility];  Service: Endoscopy;  Laterality: N/A;    There were no vitals filed for this visit.      Subjective Assessment - 06/21/16 1533    Subjective Pt asked why someone was taking a narrative last session, and that it would have been nice to know why someone else was present (SLP observing session)   Patient is accompained by: Family member  husband Jesusita Oka   Currently in Pain? No/denies               ADULT SLP TREATMENT - 06/21/16 0001      General Information   Behavior/Cognition Alert;Cooperative;Pleasant mood     Treatment Provided   Treatment provided Cognitive-Linquistic     Pain Assessment   Pain Assessment No/denies pain     Cognitive-Linquistic Treatment   Treatment focused on Cognition;Patient/family/caregiver education   Skilled Treatment Pt seen for skilled ST session targeting breath support and attention goals. Pt has vocal education background, and was able  to discuss methods of improving breath support and control, as she used in her classes. SLP encouraged pt to practice these techniques that were so familiar to her, but now need to be used for increasing loudness and breath control. Pt indicated she is on Ritalin, which helps her focus, but that she frequently loses her train of thought. Pt and husband requested the phone number for the speech and hearing clinic in [facility] for voice and ENT evaluation. This was provided for them. 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      Assessment / Recommendations / Plan   Plan Continue with current plan of care     Progression Toward Goals   Progression toward goals Progressing toward goals          SLP Education - 06/21/16 1540    Education provided Yes   Education Details breath support techniques for improved control and loudness.   Person(s) Educated Patient;Spouse   Methods Explanation;Demonstration   Comprehension Verbalized understanding;Returned demonstration          SLP Short Term Goals - 06/21/16 1543      SLP SHORT TERM GOAL #1   Title pt will demo sustained/selective attention in NOVEL therapy tasks in a min-mod noisy environment for 10 minutes, with rare  min A back to task   Time 3   Period Weeks   Status On-going     SLP SHORT TERM GOAL #2   Title pt will demo awareness of errors in novel therapy tasks, 60% of the time, over three sessions   Time 3   Period Weeks   Status On-going     SLP SHORT TERM GOAL #3   Title pt will demo exercises for incr'd breath support with occasional min A over three sessions   Time 3   Period Weeks   Status On-going          SLP Long Term Goals - 06/21/16 1543      SLP LONG TERM GOAL #1   Title pt will demo alternating attention with simple therapy tasks with rare min A needed for redirection/finding place  back within the task   Time 7   Period Weeks   Status On-going     SLP LONG TERM GOAL #2   Title pt will exhibit emergent awareness in simple to mod complex NOVEL cognitive linguistic tasks 90% of the time over three sessions   Time 7   Period Weeks   Status On-going     SLP LONG TERM GOAL #3   Title pt will demonstrate WNL breath support for multiple sentence responses in 10 minute conversation over three sessions    Time 7   Period Weeks   Status On-going          Plan - 06/21/16 1540    Clinical Impression Statement Pt pleasant and cooperative with unfamiliar therapist. 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 She was able to verbalize and demonstrate deep breathing techniques. Husband was present, and interacted appropriately with SLP and pt. Continued skilled ST intervention is recommended for breath support and cognitive linguistic deficits.   Speech Therapy Frequency 2x / week   Duration --  8 weeks or 16 visits      Patient will benefit from skilled therapeutic intervention in order to improve the following deficits and impairments:   Cognitive communication deficit    Problem List Patient Active Problem List   Diagnosis Date Noted  . 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 Orthostatic hypotension 04/08/2016  . Muscle cramps 04/08/2016  . 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 Cerebral thrombosis with cerebral infarction 04/06/2016  . Basilar artery stenosis   . Dysarthria 04/05/2016  . History of CVA with residual deficit 04/05/2016  . Sinus tachycardia 04/05/2016  . Acute encephalopathy 04/05/2016  . Chronic pain 04/05/2016  . Hyperlipidemia 04/05/2016  . Ischemic stroke (HCC)   . 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 TIA (transient ischemic attack) 11/07/2014  . Uncontrolled hypertension 11/07/2014  . DM II (diabetes  mellitus, type II), controlled (HCC) 11/07/2014   Shelly Mcdaniel, MSP, CCC-SLP  Leigh AuroraBueche, Caitlinn Klinker Brown 06/21/2016, 3:45 PM  [facility] [address] [place], [place], [address] Phone: [phone]   Fax:  [phone]   Name: Shelly Mcdaniel MRN: [id number] Date of Birth: 02-08-1956

## 2016-06-22 ENCOUNTER — Ambulatory Visit: Payer: BC Managed Care – PPO | Admitting: Occupational Therapy

## 2016-06-22 ENCOUNTER — Encounter: Payer: Self-pay | Admitting: Occupational Therapy

## 2016-06-22 DIAGNOSIS — R278 Other lack of coordination: Secondary | ICD-10-CM

## 2016-06-22 DIAGNOSIS — R41842 Visuospatial deficit: Secondary | ICD-10-CM

## 2016-06-22 DIAGNOSIS — M6281 Muscle weakness (generalized): Secondary | ICD-10-CM

## 2016-06-22 DIAGNOSIS — I69354 Hemiplegia and hemiparesis following cerebral infarction affecting left non-dominant side: Secondary | ICD-10-CM

## 2016-06-22 DIAGNOSIS — M25512 Pain in left shoulder: Secondary | ICD-10-CM

## 2016-06-22 DIAGNOSIS — R293 Abnormal posture: Secondary | ICD-10-CM

## 2016-06-22 NOTE — Therapy (Signed)
[facility] [address] [place], [place], [address] Phone: [phone]   Fax:  [phone]  Occupational Therapy Treatment  Patient Details  Name: Shelly Mcdaniel MRN: [id number] Date of Birth: May 20, 1956 Referring Provider: Dr. Claudette Laws  Encounter Date: 06/22/2016      OT End of Session - 06/22/16 1555    Visit Number 5   Number of Visits 25   Date for OT Re-Evaluation 07/30/16   Authorization Type BC/BS   OT Start Time 1455   OT Stop Time 1535   OT Time Calculation (min) 40 min   Activity Tolerance Patient tolerated treatment well   Behavior During Therapy [facility] for tasks assessed/performed      Past Medical History:  Diagnosis Date  . Diabetes mellitus without complication (HCC)   . Hyperlipidemia   . Hypertension   . Stroke (HCC)   . UTI (urinary tract infection)     Past Surgical History:  Procedure Laterality Date  . BACK SURGERY    . BREAST SURGERY    . COLONOSCOPY WITH PROPOFOL N/A 12/24/2013   Procedure: COLONOSCOPY WITH PROPOFOL;  Surgeon: Charolett Bumpers, MD;  Location: [facility];  Service: Endoscopy;  Laterality: N/A;    There were no vitals filed for this visit.      Subjective Assessment - 06/22/16 1538    Subjective  My arm has been much better   Patient is accompained by: Family member   Pertinent History see Epic, Dr Roda Shutters has approved DBP less than 110 and HR 115-120 for therapy parameters.    Limitations elevated BP and orthostatic hypotension at times, monitor BP   Patient Stated Goals to be more indpendent   Currently in Pain? Yes   Pain Score 1    Pain Location Shoulder   Pain Orientation Mid   Pain Descriptors / Indicators Aching   Pain Type Chronic pain   Pain Onset More than a month ago   Pain Frequency Intermittent   Aggravating Factors  dependent position   Pain Relieving Factors repositioning/ support                      OT  Treatments/Exercises (OP) - 06/22/16 0001      Neurological Re-education Exercises   Other Exercises 1 Neuromuscular reeducagtion to address postural control and gentle shoulder mobility.  Worked on active trunk flexion and rotation when rolling toward right side, versus leaving left arm behind body.  Patient in sitting able to tolerate active weight shifts toward left siude, even able to activate onto left hip.     Other Exercises 2 Patient's husband indicated that questions were raised about hand splint last session.  Husband brought in splint.  Patient does not tolerate wearing splint at night, and reprts that it impedes her ability to sleep.  Instructed aptient to wear splint during daytinme hours 2-4 hours at  a time.  Patient instructed to remove splint if painful                  OT Education - 06/22/16 1555    Education provided Yes   Education Details new splint wearing schedule, reiterated the importance of arm support - lap tray   Person(s) Educated Patient;Spouse   Methods Explanation;Demonstration   Comprehension Verbalized understanding;Returned demonstration          OT Short Term Goals - 06/15/16 1250      OT SHORT TERM GOAL #1   Title  Pt/family independent with initial HEP for LUE (due 06/29/16)   Time 4   Period Weeks   Status On-going     OT SHORT TERM GOAL #2   Title Pt/family to verbalize understanding with proper positioning of LUE during activity and in bed to prevent pain including use of sling /splint prn   Time 4   Period Weeks   Status On-going     OT SHORT TERM GOAL #3   Title Pt to perform UB dressing with min assist using hemi techniques   Time 4   Period Weeks   Status On-going     OT SHORT TERM GOAL #4   Title Pt will donn pants with mod A.   Time 4   Period Weeks   Status On-going     OT SHORT TERM GOAL #5   Title Pt will use LUE as a stabilizer with min A 25 % of the time for ADLS/ functional activity   Time 4   Period Weeks    Status On-going     OT SHORT TERM GOAL #6   Title Pt will perform bathing with min A for UB and mod A for LB   Time 4   Period Weeks   Status On-going           OT Long Term Goals - 06/15/16 1250      OT LONG TERM GOAL #1   Title Independent with updated HEP prn (all LTG's due 07/30/15)   Time 8   Period Weeks   Status On-going     OT LONG TERM GOAL #2   Title Pt to demo active low range sh. flexion to 25 degrees in prep for reaching and use of arm as stabilizer   Time 8   Period Weeks   Status On-going     OT LONG TERM GOAL #3   Title Pt to demo 25% gross finger flexion/extension to grasp/release cylindrical objects   Time 8   Period Weeks   Status On-going     OT LONG TERM GOAL #4   Title Pt will use LUE as a stabilizer for ADLs with min v.c. and pain less than or equal to 3/10   Time 8   Period Weeks   Status On-going     OT LONG TERM GOAL #5   Title Pt will perfrom toilet and shower transfers with min A.   Time 8   Period Weeks   Status On-going               Plan - 06/22/16 1556    Clinical Impression Statement Patient is progressing slowly toward goals, limited by pain and fear of movement   Rehab Potential Fair   OT Frequency 3x / week   OT Duration 8 weeks   OT Treatment/Interventions Self-care/ADL training;Moist Heat;DME and/or AE instruction;Splinting;Patient/family education;Therapeutic exercises;Compression bandaging;Therapeutic activities;Neuromuscular education;Functional Mobility Training;Passive range of motion;Cognitive remediation/compensation;Electrical Stimulation;Manual Therapy;Dry needling;Visual/perceptual remediation/compensation   Plan trunk and LUE NMR, monitor BP   Consulted and Agree with Plan of Care Patient   Family Member Consulted Husband - [name]      Patient will benefit from skilled therapeutic intervention in order to improve the following deficits and impairments:  Decreased activity tolerance, Decreased balance,  Decreased cognition, Decreased knowledge of use of DME, Decreased coordination, Decreased endurance, Decreased range of motion, Decreased safety awareness, Decreased skin integrity, Decreased strength, Impaired perceived functional ability, Increased edema, Impaired sensation, Impaired tone, Impaired UE functional use, Impaired vision/preception,  Pain, Improper body mechanics, Decreased mobility, Abnormal gait, Difficulty walking, Decreased knowledge of precautions  Visit Diagnosis: Abnormal posture  Muscle weakness (generalized)  Hemiplegia and hemiparesis following cerebral infarction affecting left non-dominant side (HCC)  Acute pain of left shoulder  Other lack of coordination  Visuospatial deficit    Problem List Patient Active Problem List   Diagnosis Date Noted  . 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 Orthostatic hypotension 04/08/2016  . Muscle cramps 04/08/2016  . 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 Cerebral thrombosis with cerebral infarction 04/06/2016  . Basilar artery stenosis   . Dysarthria 04/05/2016  . History of CVA with residual deficit 04/05/2016  . Sinus tachycardia 04/05/2016  . Acute encephalopathy 04/05/2016  . Chronic pain 04/05/2016  . Hyperlipidemia 04/05/2016  . Ischemic stroke (HCC)   . 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 TIA (transient ischemic attack) 11/07/2014  . Uncontrolled hypertension 11/07/2014  . DM II (diabetes mellitus, type II), controlled (HCC) 11/07/2014    Collier Salina 06/22/2016, 3:57 PM  [facility] [address] [place], [place], [address] Phone: [phone]   Fax:  [phone]  Name: Shelly Mcdaniel MRN: [id number] Date of Birth: 09-15-1955

## 2016-06-23 ENCOUNTER — Encounter: Payer: BC Managed Care – PPO | Admitting: Occupational Therapy

## 2016-06-23 ENCOUNTER — Ambulatory Visit: Payer: BC Managed Care – PPO | Admitting: Rehabilitation

## 2016-06-24 ENCOUNTER — Ambulatory Visit: Payer: BC Managed Care – PPO | Admitting: Rehabilitation

## 2016-06-24 ENCOUNTER — Encounter: Payer: Self-pay | Admitting: Rehabilitation

## 2016-06-24 DIAGNOSIS — I69354 Hemiplegia and hemiparesis following cerebral infarction affecting left non-dominant side: Secondary | ICD-10-CM

## 2016-06-24 DIAGNOSIS — R2681 Unsteadiness on feet: Secondary | ICD-10-CM

## 2016-06-24 DIAGNOSIS — R2689 Other abnormalities of gait and mobility: Secondary | ICD-10-CM

## 2016-06-24 DIAGNOSIS — M6281 Muscle weakness (generalized): Secondary | ICD-10-CM

## 2016-06-24 DIAGNOSIS — R293 Abnormal posture: Secondary | ICD-10-CM

## 2016-06-24 NOTE — Therapy (Signed)
[facility] [address] [place], [place], [address] Phone: [phone]   Fax:  [phone]  Physical Therapy Treatment  Patient Details  Name: Shelly Mcdaniel MRN: [id number] Date of Birth: 1955/11/04 Referring Provider: Marvel Plan, MD and Claudette Laws, MD  Encounter Date: 06/24/2016      PT End of Session - 06/24/16 1342    Visit Number 4   Number of Visits 21   Date for PT Re-Evaluation 08/01/16   Authorization Type [employer] 0 visit limit, 0 auth   PT Start Time 1102   PT Stop Time 1147   PT Time Calculation (min) 45 min   Activity Tolerance Patient limited by pain;Treatment limited secondary to medical complications (Comment)  pain in LE, limited by BP   Behavior During Therapy [facility] for tasks assessed/performed      Past Medical History:  Diagnosis Date  . Diabetes mellitus without complication (HCC)   . Hyperlipidemia   . Hypertension   . Stroke (HCC)   . UTI (urinary tract infection)     Past Surgical History:  Procedure Laterality Date  . BACK SURGERY    . BREAST SURGERY    . COLONOSCOPY WITH PROPOFOL N/A 12/24/2013   Procedure: COLONOSCOPY WITH PROPOFOL;  Surgeon: Charolett Bumpers, MD;  Location: [facility];  Service: Endoscopy;  Laterality: N/A;    There were no vitals filed for this visit.      Subjective Assessment - 06/24/16 1109    Subjective Per husband, had a bad few days where she was dehydrated but is now better.     Patient is accompained by: Family member   Limitations House hold activities;Walking   Patient Stated Goals "I want to work on my left extremity."    Currently in Pain? Yes   Pain Score 1    Pain Location Shoulder   Pain Orientation Left   Pain Descriptors / Indicators Aching   Pain Type Chronic pain   Pain Onset More than a month ago   Pain Frequency Intermittent   Aggravating Factors  dependent position   Pain Relieving Factors repositioning, pain medication                          [facility] Adult PT Treatment/Exercise - 06/24/16 0001      Transfers   Transfers Squat Pivot Transfers   Squat Pivot Transfers 4: Min assist   Squat Pivot Transfer Details (indicate cue type and reason) Continued cues for posture, increased forward weight shift and sequencing prior to transfer.       Ambulation/Gait   Ambulation/Gait Yes   Ambulation/Gait Assistance 4: Min assist;3: Mod assist   Ambulation/Gait Assistance Details Session focused on gait with use of L PFRW to hopefully begin to do this at home with husband.  Performed 4 bouts of gait up to 40' at a time at min A level with PT x 3 reps and husband x 1 rep.  During gait provided cues for pt regarding upright posture, using RW as visual for midline, increased step width, and maintaining position inside of RW, esp when making turns.  Cues to husband regarding how to cue pt, where to assist with gait belt, and also how to assist guiding RW during gait.  Following gait with husband, note that he states daugther will also be there this weekend and therefore PT okayed them to walk as long as she was there, however when she leaves, wait  to practice more in PT to increase safety.  Pt and husband verbalized understanding.    Ambulation Distance (Feet) --  see above   Assistive device Left platform walker   Gait Pattern Step-through pattern;Decreased stance time - left;Decreased hip/knee flexion - left;Decreased dorsiflexion - left;Lateral trunk lean to right;Decreased arm swing - left;Decreased weight shift to left;Narrow base of support;Left circumduction;Left genu recurvatum;Ataxic   Ambulation Surface Level;Indoor     Self-Care   Self-Care Other Self-Care Comments   Other Self-Care Comments  see gait section.  Also briefly discussed set up at home with bed and bedside commode.  After looking at picture, PT recommended that husband move bedside commode further up towards foot up bed due to height and  length of bed rail.                  PT Education - 06/24/16 1342    Education provided Yes   Education Details see gait and self care sections   Person(s) Educated Patient   Methods Explanation   Comprehension Verbalized understanding          PT Short Term Goals - 06/02/16 1905      PT SHORT TERM GOAL #1   Title Pt will initiate HEP in order to indicate improved functional mobility and decreased fall risk.  (Target Date: 06/30/16)   Time 4   Period Weeks   Status New     PT SHORT TERM GOAL #2   Title Pt will perform stand pivot transfer w/ LRAD at S level in order to increase independence at home.     Time 4   Period Weeks   Status New     PT SHORT TERM GOAL #3   Title Pt will perform bed mobility at mod I level in order to indicate improved functional independence.     Time 4   Period Weeks   Status New     PT SHORT TERM GOAL #4   Title Pt will ambulate 100' w/ LRAD at min A level in order to indicate improved household ambulation.     Time 4   Period Weeks   Status New     PT SHORT TERM GOAL #5   Title Pt will perform PASS and improve score 3 points in order to indicate improved functional mobility.     Time 4   Period Weeks   Status New           PT Long Term Goals - 06/02/16 1910      PT LONG TERM GOAL #1   Title Pt/spouse will be independent with HEP in order to indicate improved functional mobility and decreased fall risk.  (Target Date: 07/28/16)   Time 8   Period Weeks   Status New     PT LONG TERM GOAL #2   Title Pt will perform stand pivot transfers w/ LRAD at mod I level in order to indicate improved functional independence.    Time 8   Period Weeks   Status New     PT LONG TERM GOAL #3   Title Pt will ambulate x 200' w/ LRAD at S level over indoor surfaces in order to increase independence with household ambulation.     Time 8   Period Weeks   Status New     PT LONG TERM GOAL #4   Title Pt will perform dynamic standing  balance with intermittent UE support x 5 mins in order to increase independence with  ADLs.    Time 8   Period Weeks   Status New     PT LONG TERM GOAL #5   Title Pt will ambulate over paved outdoor surfaces w/ LRAD x 200' at S level in order to indicate initiation of community ambulation.    Time 8   Period Weeks   Status New               Plan - 06/24/16 1343    Clinical Impression Statement Skilled session focused on gait with L PFRW in hopes of beginning this at home.  Still feel that they need more practice but since pt will have daughter at home this weekend, okayed them to walk at home with her but otherwise wait for more practice.     Rehab Potential Good   Clinical Impairments Affecting Rehab Potential pt motivated by may be limited due to medical complexity and poor cognition   PT Frequency 2x / week  then 3x/wk for 4 weeks   PT Duration 4 weeks  then 3x/wk for 4 week   PT Treatment/Interventions ADLs/Self Care Home Management;Canalith Repostioning;Electrical Stimulation;DME Instruction;Stair training;Gait training;Functional mobility training;Therapeutic activities;Therapeutic exercise;Balance training;Neuromuscular re-education;Patient/family education;Orthotic Fit/Training;Passive range of motion;Energy conservation;Taping;Vestibular;Visual/perceptual remediation/compensation   PT Next Visit Plan Work on sit<>stand with pt and also with pt/husband, stand pivot transfers with device when able (also practice with pt and husband), gait with L PFRW, initiate SIMPLE HEP    Consulted and Agree with Plan of Care Patient;Family member/caregiver   Family Member Consulted Husband [name]      Patient will benefit from skilled therapeutic intervention in order to improve the following deficits and impairments:  Abnormal gait, Decreased activity tolerance, Decreased balance, Decreased cognition, Decreased coordination, Decreased endurance, Decreased knowledge of precautions, Decreased  knowledge of use of DME, Decreased mobility, Decreased safety awareness, Decreased strength, Impaired perceived functional ability, Impaired flexibility, Impaired sensation, Impaired tone, Impaired UE functional use, Improper body mechanics, Postural dysfunction  Visit Diagnosis: Abnormal posture  Muscle weakness (generalized)  Hemiplegia and hemiparesis following cerebral infarction affecting left non-dominant side (HCC)  Unsteadiness on feet  Other abnormalities of gait and mobility     Problem List Patient Active Problem List   Diagnosis Date Noted  . 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 Orthostatic hypotension 04/08/2016  . Muscle cramps 04/08/2016  . 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 Cerebral thrombosis with cerebral infarction 04/06/2016  . Basilar artery stenosis   . Dysarthria 04/05/2016  . History of CVA with residual deficit 04/05/2016  . Sinus tachycardia 04/05/2016  . Acute encephalopathy 04/05/2016  . Chronic pain 04/05/2016  . Hyperlipidemia 04/05/2016  . Ischemic stroke (HCC)   . 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 TIA (transient ischemic attack) 11/07/2014  . Uncontrolled hypertension 11/07/2014  . DM II (diabetes mellitus, type II), controlled (HCC) 11/07/2014    Harriet ButteEmily Altonio Schwertner, PT, MPT [facility] [address] [place], [place], [address] Phone: [phone]   Fax:  [phone] 06/24/16, 1:46 PM  Name: Wendie SimmerJody D Lineman MRN: [id number] Date of Birth: November 25, 1955

## 2016-06-28 ENCOUNTER — Ambulatory Visit: Payer: BC Managed Care – PPO | Admitting: Occupational Therapy

## 2016-06-28 ENCOUNTER — Ambulatory Visit: Payer: BC Managed Care – PPO | Admitting: Speech Pathology

## 2016-06-29 ENCOUNTER — Emergency Department (HOSPITAL_COMMUNITY)
Admission: EM | Admit: 2016-06-29 | Discharge: 2016-06-29 | Disposition: A | Discharge status: Home / Self Care | Payer: BC Managed Care – PPO | Attending: Emergency Medicine | Admitting: Emergency Medicine

## 2016-06-29 ENCOUNTER — Ambulatory Visit: Payer: BC Managed Care – PPO | Admitting: Occupational Therapy

## 2016-06-29 ENCOUNTER — Ambulatory Visit: Payer: BC Managed Care – PPO

## 2016-06-29 ENCOUNTER — Encounter (HOSPITAL_COMMUNITY): Payer: Self-pay | Admitting: Family Medicine

## 2016-06-29 DIAGNOSIS — Z8673 Personal history of transient ischemic attack (TIA), and cerebral infarction without residual deficits: Secondary | ICD-10-CM | POA: Diagnosis not present

## 2016-06-29 DIAGNOSIS — I10 Essential (primary) hypertension: Secondary | ICD-10-CM | POA: Insufficient documentation

## 2016-06-29 DIAGNOSIS — N3000 Acute cystitis without hematuria: Secondary | ICD-10-CM | POA: Insufficient documentation

## 2016-06-29 DIAGNOSIS — Z79899 Other long term (current) drug therapy: Secondary | ICD-10-CM | POA: Diagnosis not present

## 2016-06-29 DIAGNOSIS — Z7984 Long term (current) use of oral hypoglycemic drugs: Secondary | ICD-10-CM | POA: Diagnosis not present

## 2016-06-29 DIAGNOSIS — E119 Type 2 diabetes mellitus without complications: Secondary | ICD-10-CM | POA: Diagnosis not present

## 2016-06-29 DIAGNOSIS — R109 Unspecified abdominal pain: Secondary | ICD-10-CM | POA: Diagnosis present

## 2016-06-29 DIAGNOSIS — Z7982 Long term (current) use of aspirin: Secondary | ICD-10-CM | POA: Insufficient documentation

## 2016-06-29 LAB — URINALYSIS, ROUTINE W REFLEX MICROSCOPIC
BACTERIA UA: NONE SEEN
BILIRUBIN URINE: NEGATIVE
Glucose, UA: NEGATIVE mg/dL
Hgb urine dipstick: NEGATIVE
Ketones, ur: NEGATIVE mg/dL
Leukocytes, UA: NEGATIVE
NITRITE: POSITIVE — AB
PH: 6 (ref 5.0–8.0)
Protein, ur: NEGATIVE mg/dL
Specific Gravity, Urine: 1.021 (ref 1.005–1.030)

## 2016-06-29 LAB — BASIC METABOLIC PANEL
Anion gap: 8 (ref 5–15)
BUN: 15 mg/dL (ref 6–20)
CO2: 24 mmol/L (ref 22–32)
Calcium: 9.4 mg/dL (ref 8.9–10.3)
Chloride: 105 mmol/L (ref 101–111)
Creatinine, Ser: 0.48 mg/dL (ref 0.44–1.00)
GFR calc Af Amer: >60 mL/min (ref >60)
GFR calc non Af Amer: >60 mL/min (ref >60)
GLUCOSE: 77 mg/dL (ref 65–99)
POTASSIUM: 3.6 mmol/L (ref 3.5–5.1)
Sodium: 137 mmol/L (ref 135–145)

## 2016-06-29 LAB — CBC
HCT: 34 % — ABNORMAL LOW (ref 36.0–46.0)
Hemoglobin: 11.6 g/dL — ABNORMAL LOW (ref 12.0–15.0)
MCH: 30.7 pg (ref 26.0–34.0)
MCHC: 34.1 g/dL (ref 30.0–36.0)
MCV: 89.9 fL (ref 78.0–100.0)
PLATELETS: 236 10*3/uL (ref 150–400)
RBC: 3.78 MIL/uL — AB (ref 3.87–5.11)
RDW: 14.6 % (ref 11.5–15.5)
WBC: 5.1 10*3/uL (ref 4.0–10.5)

## 2016-06-29 MED ORDER — TRAMADOL HCL 50 MG PO TABS: 50.0000 mg | ORAL_TABLET | Freq: Four times a day (QID) | ORAL | 0 refills | Status: DC | PRN

## 2016-06-29 MED ORDER — MORPHINE SULFATE (PF) 4 MG/ML IV SOLN
4.0000 mg | Freq: Once | INTRAVENOUS | Status: DC
  Filled 2016-06-29: qty 1

## 2016-06-29 MED ORDER — DEXTROSE 5 % IV SOLN
1.0000 g | Freq: Once | INTRAVENOUS | Status: AC
  Administered 2016-06-29: 1 g via INTRAVENOUS
  Filled 2016-06-29: qty 10

## 2016-06-29 MED ORDER — KETOROLAC TROMETHAMINE 15 MG/ML IJ SOLN
15.0000 mg | Freq: Once | INTRAMUSCULAR | Status: AC
  Administered 2016-06-29: 15 mg via INTRAVENOUS
  Filled 2016-06-29: qty 1

## 2016-06-29 MED ORDER — CEPHALEXIN 500 MG PO CAPS: 500.0000 mg | ORAL_CAPSULE | Freq: Two times a day (BID) | ORAL | 0 refills | Status: DC

## 2016-06-29 MED ORDER — TRAMADOL HCL 50 MG PO TABS
25.0000 mg | ORAL_TABLET | Freq: Once | ORAL | Status: AC
  Administered 2016-06-29: 25 mg via ORAL
  Filled 2016-06-29: qty 1

## 2016-06-29 MED ORDER — PHENAZOPYRIDINE HCL 95 MG PO TABS: 95.0000 mg | ORAL_TABLET | Freq: Three times a day (TID) | ORAL | 0 refills | Status: DC | PRN

## 2016-06-29 MED ORDER — ONDANSETRON HCL 4 MG/2ML IJ SOLN
4.0000 mg | Freq: Once | INTRAMUSCULAR | Status: AC
  Administered 2016-06-29: 4 mg via INTRAVENOUS
  Filled 2016-06-29: qty 2

## 2016-06-29 MED ORDER — SODIUM CHLORIDE 0.9 % IV BOLUS (SEPSIS)
1000.0000 mL | Freq: Once | INTRAVENOUS | Status: AC
  Administered 2016-06-29: 1000 mL via INTRAVENOUS

## 2016-06-29 NOTE — ED Notes (Signed)
Gave patient ginger ale to drink and made EDP aware that patient would like some toradol prior to being discharged for flank pain.

## 2016-06-29 NOTE — ED Triage Notes (Addendum)
Pt presents from home via [facility] with c/o generalized body aches, flank pain, and some mild confusion - pt, daughter, and husband report that pt has a history of UTI's and this is her typical presentation. Pt has history stroke and has left sided deficits and some speech deficits. She is in  NAD and is A&Ox4. Pt was given 1 dose of Cipro and Azo at home.

## 2016-06-29 NOTE — ED Provider Notes (Signed)
[facility]-EMERGENCY DEPT Provider Note   CSN: [id number] Arrival date & time: 06/29/16  0707     History   Chief Complaint Chief Complaint  Patient presents with  . Generalized Body Aches  . Flank Pain    HPI Shelly Mcdaniel is a 61 y.o. female.  Patient with past medical history of diabetes, hypertension, hyperlipidemia, stroke, and frequent UTI presents emergency department with generalized body aches, flank pain, and urinary hesitancy. She states the symptoms started about 3 days ago, but acutely worsened yesterday. She denies any fevers chills. She reports that she has had some nausea and vomiting. Denies any diarrhea. She is having normal bowel movements. She is tried taking Ultram for her pain with mild relief. She rates the flank pain as 7 out of 10. She states this feels similar to prior UTIs in the past. There are no other associated symptoms. There are no modifying factors.   The history is provided by the patient. No language interpreter was used.    Past Medical History:  Diagnosis Date  . Diabetes mellitus without complication (HCC)   . Hyperlipidemia   . Hypertension   . Stroke (HCC)   . UTI (urinary tract infection)     Patient Active Problem List   Diagnosis Date Noted  . 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 Orthostatic hypotension 04/08/2016  . Muscle cramps 04/08/2016  . 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 Cerebral thrombosis with cerebral infarction 04/06/2016  . Basilar artery stenosis   . Dysarthria 04/05/2016  . History of CVA with residual deficit 04/05/2016  . Sinus tachycardia 04/05/2016  . Acute encephalopathy 04/05/2016  . Chronic pain 04/05/2016  . Hyperlipidemia 04/05/2016  . Ischemic stroke (HCC)   . 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 TIA (transient ischemic attack) 11/07/2014  . Uncontrolled hypertension 11/07/2014  . DM II (diabetes mellitus, type II),  controlled (HCC) 11/07/2014    Past Surgical History:  Procedure Laterality Date  . BACK SURGERY    . BREAST SURGERY    . COLONOSCOPY WITH PROPOFOL N/A 12/24/2013   Procedure: COLONOSCOPY WITH PROPOFOL;  Surgeon: Charolett Bumpers, MD;  Location: [facility];  Service: Endoscopy;  Laterality: N/A;    OB History    No data available       Home Medications    Prior to Admission medications   Medication Sig Start Date End Date Taking? Authorizing Provider  aspirin EC 325 MG EC tablet Take 1 tablet (325 mg total) by mouth daily. 04/09/16   Nishant Dhungel, MD  baclofen (LIORESAL) 10 MG tablet Take 1.5 tablets (15 mg total) by mouth 3 (three) times daily. 04/29/16   Mcarthur Rossetti Angiulli, PA-C  cholecalciferol (VITAMIN D) 1000 units tablet Take 2,000 Units by mouth daily.    Historical Provider, MD  ciprofloxacin (CIPRO) 500 MG tablet Take 1 tablet (500 mg total) by mouth 2 (two) times daily. One po bid x 7 days 06/06/16   Fayrene Helper, PA-C  clopidogrel (PLAVIX) 75 MG tablet Take 1 tablet (75 mg total) by mouth daily. 04/29/16   Mcarthur Rossetti Angiulli, PA-C  docusate sodium (COLACE) 100 MG capsule Take 100 mg by mouth 2 (two) times daily.     Historical Provider, MD  LEVEMIR FLEXTOUCH 100 UNIT/ML Pen Inject 25 Units into the skin every evening. Patient taking differently: Inject 18-25 Units into the skin every evening.  04/29/16   Mcarthur Rossetti Angiulli, PA-C  lidocaine (  LIDODERM) 5 % Place 3 patches onto the skin daily. Remove & Discard patch within 12 hours or as directed by MD Patient taking differently: Place 3 patches onto the skin daily as needed (pain). Remove & Discard patch within 12 hours or as directed by MD 04/29/16   Mcarthur Rossetti Angiulli, PA-C  lisinopril (PRINIVIL,ZESTRIL) 10 MG tablet Take 1 tablet (10 mg total) by mouth daily. Patient taking differently: Take 10 mg by mouth daily as needed (high blood pressure).  04/29/16   Mcarthur Rossetti Angiulli, PA-C  Melatonin 3 MG TABS Place 2 tablets (6 mg  total) into feeding tube at bedtime. Patient taking differently: Take 6 mg by mouth at bedtime.  04/29/16   Mcarthur Rossetti Angiulli, PA-C  metFORMIN (GLUCOPHAGE) 850 MG tablet Take 1 tablet (850 mg total) by mouth 2 (two) times daily with a meal. Patient taking differently: Take 1,000 mg by mouth 2 (two) times daily with a meal.  04/29/16   Mcarthur Rossetti Angiulli, PA-C  methylphenidate (RITALIN) 5 MG tablet Take 1 tablet (5 mg total) by mouth 2 (two) times daily with breakfast and lunch. 04/29/16   Mcarthur Rossetti Angiulli, PA-C  metoprolol succinate (TOPROL-XL) 25 MG 24 hr tablet Take 25 mg by mouth every morning.    Historical Provider, MD  midodrine (PROAMATINE) 5 MG tablet Take 1 tablet (5 mg total) by mouth 2 (two) times daily with a meal. One in the morning and one in the early afternoon and no later than 4pm 05/23/16   Marvel Plan, MD  ondansetron (ZOFRAN) 4 MG tablet Take 1 tablet (4 mg total) by mouth every 8 (eight) hours as needed for nausea or vomiting. 06/06/16   Fayrene Helper, PA-C  phenazopyridine (PYRIDIUM) 200 MG tablet Take 1 tablet (200 mg total) by mouth 3 (three) times daily as needed for pain. 06/06/16   Fayrene Helper, PA-C  polyethylene glycol (MIRALAX / GLYCOLAX) packet Take 17 g by mouth daily as needed for moderate constipation.    Historical Provider, MD  potassium chloride (K-DUR,KLOR-CON) 10 MEQ tablet Take 1 tablet (10 mEq total) by mouth 2 (two) times daily. 04/29/16   Mcarthur Rossetti Angiulli, PA-C  rosuvastatin (CRESTOR) 20 MG tablet Take 1 tablet (20 mg total) by mouth at bedtime. Patient taking differently: Take 10 mg by mouth at bedtime.  05/30/16   Marvel Plan, MD  scopolamine (TRANSDERM-SCOP) 1 MG/3DAYS Place 1 patch (1.5 mg total) onto the skin every 3 (three) days. Patient taking differently: Place 1 patch onto the skin daily as needed (nausea).  04/29/16   Mcarthur Rossetti Angiulli, PA-C  senna (SENOKOT) 8.6 MG tablet Take 1 tablet by mouth daily as needed for constipation.     Historical Provider, MD   sertraline (ZOLOFT) 50 MG tablet Take 1 tablet (50 mg total) by mouth at bedtime. 04/29/16   Mcarthur Rossetti Angiulli, PA-C  traMADol (ULTRAM) 50 MG tablet Take 1 tablet (50 mg total) by mouth every 6 (six) hours as needed. Patient taking differently: Take 50 mg by mouth every 6 (six) hours as needed.  04/29/16   Mcarthur Rossetti Angiulli, PA-C    Family History Family History  Problem Relation Age of Onset  . Stroke Mother   . Dementia Mother   . Diabetes Father   . Stroke Father   . CAD Father   . Diabetes Sister   . Diabetes Brother   . CAD Brother   . Stroke Brother   . Kidney failure Brother   . Hypertension Other   .  Hyperlipidemia Other   . Stroke Other   . Heart disease Other   . Diabetes Other     Social History Social History  Substance Use Topics  . Smoking status: Never Smoker  . Smokeless tobacco: Never Used  . Alcohol use No     Allergies   Bee venom; Codeine; Lipitor [atorvastatin]; and Septra [sulfamethoxazole-trimethoprim]   Review of Systems Review of Systems  All other systems reviewed and are negative.    Physical Exam Updated Vital Signs BP 163/94 (BP Location: Right Arm)   Pulse 90   Temp 98 F (36.7 C) (Oral)   Resp 20   SpO2 99%   Physical Exam  Constitutional: She is oriented to person, place, and time. She appears well-developed and well-nourished.  HENT:  Head: Normocephalic and atraumatic.  Eyes: Conjunctivae and EOM are normal. Pupils are equal, round, and reactive to light.  Neck: Normal range of motion. Neck supple.  Cardiovascular: Normal rate and regular rhythm.  Exam reveals no gallop and no friction rub.   No murmur heard. Pulmonary/Chest: Effort normal and breath sounds normal. No respiratory distress. She has no wheezes. She has no rales. She exhibits no tenderness.  Abdominal: Soft. Bowel sounds are normal. She exhibits no distension and no mass. There is tenderness. There is no rebound and no guarding.  Mild suprapubic  tenderness  Musculoskeletal: Normal range of motion. She exhibits no edema or tenderness.  Neurological: She is alert and oriented to person, place, and time.  Skin: Skin is warm and dry.  Psychiatric: She has a normal mood and affect. Her behavior is normal. Judgment and thought content normal.  Nursing note and vitals reviewed.    ED Treatments / Results  Labs (all labs ordered are listed, but only abnormal results are displayed) Labs Reviewed  URINALYSIS, ROUTINE W REFLEX MICROSCOPIC - Abnormal; Notable for the following:       Result Value   Color, Urine AMBER (*)    Nitrite POSITIVE (*)    Squamous Epithelial / LPF 0-5 (*)    All other components within normal limits  CBC - Abnormal; Notable for the following:    RBC 3.78 (*)    Hemoglobin 11.6 (*)    HCT 34.0 (*)    All other components within normal limits  URINE CULTURE  BASIC METABOLIC PANEL    EKG  EKG Interpretation None       Radiology No results found.  Procedures Procedures (including critical care time)  Medications Ordered in ED Medications  morphine 4 MG/ML injection 4 mg (not administered)  ondansetron (ZOFRAN) injection 4 mg (not administered)  sodium chloride 0.9 % bolus 1,000 mL (not administered)     Initial Impression / Assessment and Plan / ED Course  I have reviewed the triage vital signs and the nursing notes.  Pertinent labs & imaging results that were available during my care of the patient were reviewed by me and considered in my medical decision making (see chart for details).  Clinical Course     Patient with generalized body aches complaint pain, and urinary hesitancy. History of UTIs. States that this feels similar. Will check urinalysis, labs, fluids, pain medicine, antiemetics, and will reassess.  Creatinine 0.48, patient requests Toradol, I will give her 15 mg.  Patient reports that she is feeling much better. She was given a run of Rocephin in the emergency department.  I checked her most recent urine culture, she is pansensitive. I will prescribe her Keflex. Will  send urine off for culture. Return precautions given. Patient is stable and ready for discharge. She has follow-up with a urologist in the near future.    Final Clinical Impressions(s) / ED Diagnoses   Final diagnoses:  Acute cystitis without hematuria    New Prescriptions Discharge Medication List as of 06/29/2016 11:54 AM    START taking these medications   Details  cephALEXin (KEFLEX) 500 MG capsule Take 1 capsule (500 mg total) by mouth 2 (two) times daily., Starting Wed 06/29/2016, Print         Roxy Horsemanobert Nakina Spatz, PA-C 06/29/16 1333    Derwood KaplanAnkit Nanavati, MD 06/30/16 [phone]

## 2016-06-30 ENCOUNTER — Encounter: Payer: BC Managed Care – PPO | Admitting: Occupational Therapy

## 2016-06-30 ENCOUNTER — Ambulatory Visit: Payer: BC Managed Care – PPO | Admitting: Rehabilitation

## 2016-06-30 LAB — URINE CULTURE

## 2016-07-01 ENCOUNTER — Ambulatory Visit: Payer: BC Managed Care – PPO | Admitting: Rehabilitation

## 2016-07-04 ENCOUNTER — Ambulatory Visit: Payer: BC Managed Care – PPO

## 2016-07-04 ENCOUNTER — Ambulatory Visit: Payer: BC Managed Care – PPO | Admitting: Occupational Therapy

## 2016-07-04 ENCOUNTER — Ambulatory Visit: Payer: BC Managed Care – PPO | Admitting: Rehabilitation

## 2016-07-07 ENCOUNTER — Ambulatory Visit: Payer: BC Managed Care – PPO | Admitting: Rehabilitation

## 2016-07-07 ENCOUNTER — Ambulatory Visit: Payer: BC Managed Care – PPO

## 2016-07-07 ENCOUNTER — Ambulatory Visit: Payer: BC Managed Care – PPO | Admitting: Occupational Therapy

## 2016-07-07 ENCOUNTER — Encounter: Payer: Self-pay | Admitting: Rehabilitation

## 2016-07-07 DIAGNOSIS — R2689 Other abnormalities of gait and mobility: Secondary | ICD-10-CM

## 2016-07-07 DIAGNOSIS — M6281 Muscle weakness (generalized): Secondary | ICD-10-CM

## 2016-07-07 DIAGNOSIS — I69354 Hemiplegia and hemiparesis following cerebral infarction affecting left non-dominant side: Secondary | ICD-10-CM

## 2016-07-07 DIAGNOSIS — R41841 Cognitive communication deficit: Secondary | ICD-10-CM

## 2016-07-07 DIAGNOSIS — R293 Abnormal posture: Secondary | ICD-10-CM

## 2016-07-07 DIAGNOSIS — R278 Other lack of coordination: Secondary | ICD-10-CM

## 2016-07-07 DIAGNOSIS — I69315 Cognitive social or emotional deficit following cerebral infarction: Secondary | ICD-10-CM

## 2016-07-07 DIAGNOSIS — M25512 Pain in left shoulder: Secondary | ICD-10-CM

## 2016-07-07 DIAGNOSIS — R471 Dysarthria and anarthria: Secondary | ICD-10-CM

## 2016-07-07 DIAGNOSIS — R2681 Unsteadiness on feet: Secondary | ICD-10-CM

## 2016-07-07 DIAGNOSIS — R41842 Visuospatial deficit: Secondary | ICD-10-CM

## 2016-07-07 NOTE — Therapy (Signed)
[facility] [address] [place], [place], [address] Phone: [phone]   Fax:  [phone]  Occupational Therapy Treatment  Patient Details  Name: Shelly Mcdaniel MRN: [id number] Date of Birth: [birth year] Referring Provider: Dr. Claudette Laws  Encounter Date: 07/07/2016      OT End of Session - 07/07/16 1730    Visit Number 6   Number of Visits 25   Date for OT Re-Evaluation 07/30/16   Authorization Type BC/BS   OT Start Time 1450   OT Stop Time 1530   OT Time Calculation (min) 40 min   Activity Tolerance Patient tolerated treatment well   Behavior During Therapy [facility] for tasks assessed/performed      Past Medical History:  Diagnosis Date  . Diabetes mellitus without complication (HCC)   . Hyperlipidemia   . Hypertension   . Stroke (HCC)   . UTI (urinary tract infection)     Past Surgical History:  Procedure Laterality Date  . BACK SURGERY    . BREAST SURGERY    . COLONOSCOPY WITH PROPOFOL N/A 12/24/2013   Procedure: COLONOSCOPY WITH PROPOFOL;  Surgeon: Charolett Bumpers, MD;  Location: [facility];  Service: Endoscopy;  Laterality: N/A;    There were no vitals filed for this visit.      Subjective Assessment - 07/07/16 1456    Subjective  I was throwing up yesterday- they think its reflux   Patient is accompained by: Family member   Pertinent History see Epic, Dr Roda Shutters has approved DBP less than 110 and HR 115-120 for therapy parameters.    Limitations elevated BP and orthostatic hypotension at times, monitor BP   Patient Stated Goals to be more indpendent   Currently in Pain? No/denies   Pain Score 0-No pain                      OT Treatments/Exercises (OP) - 07/07/16 1725      Neurological Re-education Exercises   Other Exercises 1 Neuromuscular reeducation to address postural control as related to shoulder pain, and arm movement.  Patient requires frequent mod cueing to address  new rolling pattern to incorporate left arm into movement to prevent pain.  Patient continues to have difficulty locating her forearm, elbow to help support during this transitional movement.  Patient with difficulty activating left leg for transfer stand pivot to wheelchair - immediately follwoing PT ambuilation training.  Apraxia, poor sensation, poor attention, and impaired perceptaul skills limit carryover from one session to next.  Needs repetition, and reinforcement of simple startegies.                  OT Education - 07/07/16 1730    Education provided Yes   Education Details management of left arm during rolling   Person(s) Educated Patient   Methods Explanation   Comprehension Need further instruction          OT Short Term Goals - 06/15/16 1250      OT SHORT TERM GOAL #1   Title Pt/family independent with initial HEP for LUE (due 06/29/16)   Time 4   Period Weeks   Status On-going     OT SHORT TERM GOAL #2   Title Pt/family to verbalize understanding with proper positioning of LUE during activity and in bed to prevent pain including use of sling /splint prn   Time 4   Period Weeks   Status On-going     OT SHORT  TERM GOAL #3   Title Pt to perform UB dressing with min assist using hemi techniques   Time 4   Period Weeks   Status On-going     OT SHORT TERM GOAL #4   Title Pt will donn pants with mod A.   Time 4   Period Weeks   Status On-going     OT SHORT TERM GOAL #5   Title Pt will use LUE as a stabilizer with min A 25 % of the time for ADLS/ functional activity   Time 4   Period Weeks   Status On-going     OT SHORT TERM GOAL #6   Title Pt will perform bathing with min A for UB and mod A for LB   Time 4   Period Weeks   Status On-going           OT Long Term Goals - 06/15/16 1250      OT LONG TERM GOAL #1   Title Independent with updated HEP prn (all LTG's due 07/30/15)   Time 8   Period Weeks   Status On-going     OT LONG TERM GOAL  #2   Title Pt to demo active low range sh. flexion to 25 degrees in prep for reaching and use of arm as stabilizer   Time 8   Period Weeks   Status On-going     OT LONG TERM GOAL #3   Title Pt to demo 25% gross finger flexion/extension to grasp/release cylindrical objects   Time 8   Period Weeks   Status On-going     OT LONG TERM GOAL #4   Title Pt will use LUE as a stabilizer for ADLs with min v.c. and pain less than or equal to 3/10   Time 8   Period Weeks   Status On-going     OT LONG TERM GOAL #5   Title Pt will perfrom toilet and shower transfers with min A.   Time 8   Period Weeks   Status On-going               Plan - 07/07/16 1730    Clinical Impression Statement Patient is showing slow progress toward goals due to significant perceptual, sensory, cognitive and motor impairments.  Patient has had frequent medical difficulties which impede forward momentum as well.   Rehab Potential Fair   OT Frequency 3x / week   OT Duration 8 weeks   Plan trunk and LUE NMR, monitor BP, functional standing   Consulted and Agree with Plan of Care Patient      Patient will benefit from skilled therapeutic intervention in order to improve the following deficits and impairments:  Decreased activity tolerance, Decreased balance, Decreased cognition, Decreased knowledge of use of DME, Decreased coordination, Decreased endurance, Decreased range of motion, Decreased safety awareness, Decreased skin integrity, Decreased strength, Impaired perceived functional ability, Increased edema, Impaired sensation, Impaired tone, Impaired UE functional use, Impaired vision/preception, Pain, Improper body mechanics, Decreased mobility, Abnormal gait, Difficulty walking, Decreased knowledge of precautions  Visit Diagnosis: Abnormal posture  Muscle weakness (generalized)  Hemiplegia and hemiparesis following cerebral infarction affecting left non-dominant side (HCC)  Unsteadiness on  feet  Acute pain of left shoulder  Other lack of coordination  Visuospatial deficit  Cognitive social or emotional deficit following cerebral infarction    Problem List Patient Active Problem List   Diagnosis Date Noted  . 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 Orthostatic hypotension 04/08/2016  .  Muscle cramps 04/08/2016  . 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 Cerebral thrombosis with cerebral infarction 04/06/2016  . Basilar artery stenosis   . Dysarthria 04/05/2016  . History of CVA with residual deficit 04/05/2016  . Sinus tachycardia 04/05/2016  . Acute encephalopathy 04/05/2016  . Chronic pain 04/05/2016  . Hyperlipidemia 04/05/2016  . Ischemic stroke (HCC)   . 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 TIA (transient ischemic attack) 11/07/2014  . Uncontrolled hypertension 11/07/2014  . DM II (diabetes mellitus, type II), controlled (HCC) 11/07/2014    Collier SalinaGellert, Treyce Spillers M, OTR/L 07/07/2016, 5:33 PM  [facility] [address] [place], [place], [address] Phone: [phone]   Fax:  [phone]  Name: Shelly Mcdaniel MRN: [id number] Date of Birth: Sep 20, 1955

## 2016-07-07 NOTE — Patient Instructions (Signed)
Try tasks that stretch your attention, like this coin task or simple card games, at home.

## 2016-07-07 NOTE — Therapy (Signed)
[facility] [address] [place], [place], [address] Phone: [phone]   Fax:  [phone]  Speech Language Pathology Treatment  Patient Details  Name: Shelly SimmerJody D Bristow MRN: [id number] Date of Birth: 1956-03-18 Referring Provider: Claudette LawsKirsteins, Andrew, MD  Encounter Date: 07/07/2016      End of Session - 07/07/16 1426    Visit Number 6   Number of Visits 17   Date for SLP Re-Evaluation 08/19/16   SLP Start Time 1320  pt 4 mintues late   SLP Stop Time  1400   SLP Time Calculation (min) 40 min   Activity Tolerance Patient tolerated treatment well      Past Medical History:  Diagnosis Date  . Diabetes mellitus without complication (HCC)   . Hyperlipidemia   . Hypertension   . Stroke (HCC)   . UTI (urinary tract infection)     Past Surgical History:  Procedure Laterality Date  . BACK SURGERY    . BREAST SURGERY    . COLONOSCOPY WITH PROPOFOL N/A 12/24/2013   Procedure: COLONOSCOPY WITH PROPOFOL;  Surgeon: Charolett BumpersMartin K Johnson, MD;  Location: [facility];  Service: Endoscopy;  Laterality: N/A;    There were no vitals filed for this visit.      Subjective Assessment - 07/07/16 1325    Subjective "We weren't going to... (5 seconds, then SLP asking pt to continue) I already forgot what I was going to say."   Currently in Pain? No/denies               ADULT SLP TREATMENT - 07/07/16 1327      General Information   Behavior/Cognition Alert;Cooperative;Pleasant mood     Treatment Provided   Treatment provided Cognitive-Linquistic     Cognitive-Linquistic Treatment   Treatment focused on Cognition   Skilled Treatment SLP facilitated pt's attention skills with a written task to begin, then realized pt's visual deficit may hinder pt's success, SLP changed task to non-reading task. In sustained attention task with "coins" pt initially req'd cues to stay silent and complete task prior to starting conversation  with SLP, due to the fact that she will forget what she's already done to complete the task. "You know what, I will forget," pt responded. In simple verbal directions, pt req'd max A (picking coins for a certain $ amount), consistently, for sustained/selective attention. For 25% of requests, SLP req'd to provide total A. SLP assessed pt's intellectual awareness and asked her why that task was so challenging - she arrived at the correct answer after approx 10 seconds of talking around the answer.     Assessment / Recommendations / Plan   Plan Continue with current plan of care     Progression Toward Goals   Progression toward goals Not progressing toward goals (comment)  significant difficulty with attention          SLP Education - 07/07/16 1424    Education provided Yes   Education Details deficit area - attention   Person(s) Educated Patient   Methods Explanation   Comprehension Verbalized understanding          SLP Short Term Goals - 07/07/16 1427      SLP SHORT TERM GOAL #1   Title pt will demo sustained/selective attention in NOVEL therapy tasks in a min-mod noisy environment for 10 minutes, with rare min A back to task   Time 2   Period Weeks   Status On-going     SLP SHORT TERM  GOAL #2   Title pt will demo awareness of errors in novel therapy tasks, 60% of the time, over three sessions   Time 2   Period Weeks   Status On-going     SLP SHORT TERM GOAL #3   Title pt will demo exercises for incr'd breath support with occasional min A over three sessions   Time 2   Period Weeks   Status On-going          SLP Long Term Goals - 07/07/16 1428      SLP LONG TERM GOAL #1   Title pt will demo alternating attention with simple therapy tasks with rare min A needed for redirection/finding place back within the task   Time 6   Period Weeks   Status On-going     SLP LONG TERM GOAL #2   Title pt will exhibit emergent awareness in simple to mod complex NOVEL cognitive  linguistic tasks 90% of the time over three sessions   Time 6   Period Weeks   Status On-going     SLP LONG TERM GOAL #3   Title pt will demonstrate WNL breath support for multiple sentence responses in 10 minute conversation over three sessions    Time 6   Period Weeks   Status On-going          Plan - 07/07/16 1427    Clinical Impression Statement Pt continues to present today with deficits in cognitive-linguistics specifically attention skills and awareness. SLP's opinion is that these deficits affect other areas of cognitive linguistics such as problem solving, reasoning, executive function, and memory. Pt also demonstrated decr'd breath support resulting in low speech volume at the phrase level. She would beneift from skilled ST focusing on cognitive linguistics and dysarthria to decr caregiver burden and improve overall communication skills with family and community members.   Speech Therapy Frequency 2x / week   Duration --  8 weeks, or 16 therapy visits   Treatment/Interventions Patient/family education;Compensatory techniques;Internal/external aids;SLP instruction and feedback;Cognitive reorganization;Compensatory strategies;Oral motor exercises;Cueing hierarchy;Functional tasks  any or all may be used   Potential to Achieve Goals Good   Consulted and Agree with Plan of Care Patient;Family member/caregiver   Family Member Consulted husband      Patient will benefit from skilled therapeutic intervention in order to improve the following deficits and impairments:   Cognitive communication deficit  Dysarthria and anarthria    Problem List Patient Active Problem List   Diagnosis Date Noted  . 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 Orthostatic hypotension 04/08/2016  . Muscle cramps 04/08/2016  . 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 Cerebral thrombosis with cerebral  infarction 04/06/2016  . Basilar artery stenosis   . Dysarthria 04/05/2016  . History of CVA with residual deficit 04/05/2016  . Sinus tachycardia 04/05/2016  . Acute encephalopathy 04/05/2016  . Chronic pain 04/05/2016  . Hyperlipidemia 04/05/2016  . Ischemic stroke (HCC)   . 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 TIA (transient ischemic attack) 11/07/2014  . Uncontrolled hypertension 11/07/2014  . DM II (diabetes mellitus, type II), controlled (HCC) 11/07/2014    [facility] ,MS, CCC-SLP  07/07/2016, 2:28 PM  [facility] [address] [place], [place], [address] Phone: [phone]   Fax:  [phone]   Name: TERALYN MULLINS MRN: [id number] Date of Birth: [birth year]

## 2016-07-07 NOTE — Therapy (Signed)
[facility] [address] [place], [place], [address] Phone: [phone]   Fax:  [phone]  Physical Therapy Treatment  Patient Details  Name: Shelly Mcdaniel MRN: [id number] Date of Birth: 04-29-1956 Referring Provider: Marvel Plan, MD and Claudette Laws, MD  Encounter Date: 07/07/2016      PT End of Session - 07/07/16 1527    Visit Number 5   Number of Visits 21   Date for PT Re-Evaluation 08/01/16   Authorization Type BCBS 0 visit limit, 0 auth   PT Start Time 1402   PT Stop Time 1445   PT Time Calculation (min) 43 min   Equipment Utilized During Treatment Gait belt   Activity Tolerance Patient tolerated treatment well   Behavior During Therapy [facility] for tasks assessed/performed      Past Medical History:  Diagnosis Date  . Diabetes mellitus without complication (HCC)   . Hyperlipidemia   . Hypertension   . Stroke (HCC)   . UTI (urinary tract infection)     Past Surgical History:  Procedure Laterality Date  . BACK SURGERY    . BREAST SURGERY    . COLONOSCOPY WITH PROPOFOL N/A 12/24/2013   Procedure: COLONOSCOPY WITH PROPOFOL;  Surgeon: Charolett Bumpers, MD;  Location: [facility];  Service: Endoscopy;  Laterality: N/A;    There were no vitals filed for this visit.      Subjective Assessment - 07/07/16 1412    Subjective No big changes, was sick earlier in week and had UTI last week, better now.    Patient is accompained by: Family member   Limitations House hold activities;Walking   Patient Stated Goals "I want to work on my left extremity."    Currently in Pain? Yes   Pain Score 1    Pain Location Shoulder   Pain Orientation Left   Pain Descriptors / Indicators Aching   Pain Type Chronic pain   Pain Onset More than a month ago   Pain Frequency Intermittent   Aggravating Factors  dependent position   Pain Relieving Factors repositioning, pain medication                          [facility] Adult PT Treatment/Exercise - 07/07/16 1405      Transfers   Transfers Sit to Stand;Stand to [employer] Transfers   Sit to Stand 4: Min assist;3: Mod assist   Sit to Stand Details Verbal cues for sequencing;Verbal cues for technique;Verbal cues for precautions/safety;Manual facilitation for weight shifting;Manual facilitation for weight bearing   Sit to Stand Details (indicate cue type and reason) Continues to require cues for upright posture and forward weight shift with facilitation for these at thoracic spine.     Stand to Sit 4: Min assist   Stand to Sit Details (indicate cue type and reason) Verbal cues for sequencing;Verbal cues for technique;Verbal cues for precautions/safety;Verbal cues for safe use of DME/AE;Manual facilitation for weight bearing;Manual facilitation for weight shifting   Stand to Sit Details Cues for controlled descent with forward trunk lean.    Stand Pivot Transfers 3: Mod assist   Stand Pivot Transfer Details (indicate cue type and reason) cues for sequencing and lateral weight shift and foot placement.     Comments Had husband perform transfer with pt from w/c>mat to assess quality.  Note increased apraxia as she does not tend to fully activate on LLE during transfer with husband. Will continue to address this.  Ambulation/Gait   Ambulation/Gait Yes   Ambulation/Gait Assistance 4: Min assist;3: Mod assist   Ambulation/Gait Assistance Details Performed 4 bouts of gait with PT and once with husband.  Pt only able to ambulate up to 25' at a time with L PFRW and L AFO.  Note that she continues to require cues for posture, increased L step length, L foot forward (as she tends to ER), wider steps, and max cues and assist for guiding RW.  Provided facilitation at trunk/pelvis for upright posture and decreased L trunk rotation.  Then attempted to have husband ambulate with her, however she was only able to ambulate up to  5' with extreme anxiety that she was going to fall.  Cues to take deep breaths and focus on posture, however pt needing to sit.     Ambulation Distance (Feet) --  see above   Assistive device Left platform walker   Gait Pattern Step-through pattern;Decreased stance time - left;Decreased hip/knee flexion - left;Decreased dorsiflexion - left;Lateral trunk lean to right;Decreased arm swing - left;Decreased weight shift to left;Narrow base of support;Left circumduction;Left genu recurvatum;Ataxic   Ambulation Surface Level;Indoor                PT Education - 07/07/16 1527    Education provided Yes   Education Details standing as much as possible at home   Person(s) Educated Patient;Spouse   Methods Explanation   Comprehension Verbalized understanding          PT Short Term Goals - 06/02/16 1905      PT SHORT TERM GOAL #1   Title Pt will initiate HEP in order to indicate improved functional mobility and decreased fall risk.  (Target Date: 06/30/16)   Time 4   Period Weeks   Status New     PT SHORT TERM GOAL #2   Title Pt will perform stand pivot transfer w/ LRAD at S level in order to increase independence at home.     Time 4   Period Weeks   Status New     PT SHORT TERM GOAL #3   Title Pt will perform bed mobility at mod I level in order to indicate improved functional independence.     Time 4   Period Weeks   Status New     PT SHORT TERM GOAL #4   Title Pt will ambulate 100' w/ LRAD at min A level in order to indicate improved household ambulation.     Time 4   Period Weeks   Status New     PT SHORT TERM GOAL #5   Title Pt will perform PASS and improve score 3 points in order to indicate improved functional mobility.     Time 4   Period Weeks   Status New           PT Long Term Goals - 06/02/16 1910      PT LONG TERM GOAL #1   Title Pt/spouse will be independent with HEP in order to indicate improved functional mobility and decreased fall risk.   (Target Date: 07/28/16)   Time 8   Period Weeks   Status New     PT LONG TERM GOAL #2   Title Pt will perform stand pivot transfers w/ LRAD at mod I level in order to indicate improved functional independence.    Time 8   Period Weeks   Status New     PT LONG TERM GOAL #3   Title Pt will ambulate  x 200' w/ LRAD at S level over indoor surfaces in order to increase independence with household ambulation.     Time 8   Period Weeks   Status New     PT LONG TERM GOAL #4   Title Pt will perform dynamic standing balance with intermittent UE support x 5 mins in order to increase independence with ADLs.    Time 8   Period Weeks   Status New     PT LONG TERM GOAL #5   Title Pt will ambulate over paved outdoor surfaces w/ LRAD x 200' at S level in order to indicate initiation of community ambulation.    Time 8   Period Weeks   Status New               Plan - 07/07/16 1527    Clinical Impression Statement Skilled session continues to focus on gait with L PFRW for improved quality.  She continues to demonstrate increased anxiety when performing with husband, therefore not safe at home at this time.  Continue to emphasize importance of standing at home as much as possible.     Rehab Potential Good   Clinical Impairments Affecting Rehab Potential pt motivated by may be limited due to medical complexity and poor cognition   PT Frequency 2x / week  then 3x/wk for 4 weeks   PT Duration 4 weeks  then 3x/wk for 4 week   PT Treatment/Interventions ADLs/Self Care Home Management;Canalith Repostioning;Electrical Stimulation;DME Instruction;Stair training;Gait training;Functional mobility training;Therapeutic activities;Therapeutic exercise;Balance training;Neuromuscular re-education;Patient/family education;Orthotic Fit/Training;Passive range of motion;Energy conservation;Taping;Vestibular;Visual/perceptual remediation/compensation   PT Next Visit Plan STGs, Work on sit<>stand with pt and also  with pt/husband, stand pivot transfers with device when able (also practice with pt and husband), gait with L PFRW, initiate SIMPLE HEP    Consulted and Agree with Plan of Care Patient;Family member/caregiver   Family Member Consulted Husband [name]      Patient will benefit from skilled therapeutic intervention in order to improve the following deficits and impairments:  Abnormal gait, Decreased activity tolerance, Decreased balance, Decreased cognition, Decreased coordination, Decreased endurance, Decreased knowledge of precautions, Decreased knowledge of use of DME, Decreased mobility, Decreased safety awareness, Decreased strength, Impaired perceived functional ability, Impaired flexibility, Impaired sensation, Impaired tone, Impaired UE functional use, Improper body mechanics, Postural dysfunction  Visit Diagnosis: Hemiplegia and hemiparesis following cerebral infarction affecting left non-dominant side (HCC)  Abnormal posture  Muscle weakness (generalized)  Unsteadiness on feet  Other abnormalities of gait and mobility     Problem List Patient Active Problem List   Diagnosis Date Noted  . 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 Orthostatic hypotension 04/08/2016  . Muscle cramps 04/08/2016  . 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 Cerebral thrombosis with cerebral infarction 04/06/2016  . Basilar artery stenosis   . Dysarthria 04/05/2016  . History of CVA with residual deficit 04/05/2016  . Sinus tachycardia 04/05/2016  . Acute encephalopathy 04/05/2016  . Chronic pain 04/05/2016  . Hyperlipidemia 04/05/2016  . Ischemic stroke (HCC)   . 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 TIA (transient ischemic attack) 11/07/2014  . Uncontrolled hypertension 11/07/2014  . DM II (diabetes mellitus, type II), controlled (HCC) 11/07/2014    Harriet ButteEmily Vinton Layson, PT, MPT [facility] [address] [place], [place], [address] Phone: [phone]   Fax:  [phone] 07/07/16, 3:30 PM  Name: Shelly Mcdaniel MRN: [id number] Date of  Birth: [birth year]

## 2016-07-08 ENCOUNTER — Ambulatory Visit: Payer: BC Managed Care – PPO

## 2016-07-08 ENCOUNTER — Ambulatory Visit: Payer: BC Managed Care – PPO | Admitting: Rehabilitation

## 2016-07-08 ENCOUNTER — Ambulatory Visit
Admission: RE | Admit: 2016-07-08 | Discharge: 2016-07-08 | Disposition: A | Discharge status: Home / Self Care | Payer: BC Managed Care – PPO | Source: Ambulatory Visit | Attending: Internal Medicine | Admitting: Internal Medicine

## 2016-07-08 ENCOUNTER — Other Ambulatory Visit: Payer: Self-pay | Admitting: Internal Medicine

## 2016-07-08 ENCOUNTER — Ambulatory Visit: Payer: BC Managed Care – PPO | Admitting: Occupational Therapy

## 2016-07-08 DIAGNOSIS — K5900 Constipation, unspecified: Secondary | ICD-10-CM

## 2016-07-11 ENCOUNTER — Encounter: Payer: Self-pay | Admitting: Rehabilitation

## 2016-07-11 ENCOUNTER — Ambulatory Visit: Payer: BC Managed Care – PPO | Admitting: Rehabilitation

## 2016-07-11 DIAGNOSIS — I69354 Hemiplegia and hemiparesis following cerebral infarction affecting left non-dominant side: Secondary | ICD-10-CM

## 2016-07-11 NOTE — Therapy (Signed)
[facility] [address] [place], [place], [address] Phone: [phone]   Fax:  [phone]  Patient Details  Name: Wendie SimmerJody D Odden MRN: [id number] Date of Birth: [birth year] Referring Provider:  Merlene LaughterStoneking, Hal, MD  Encounter Date: 07/11/2016   Note that pt arrived, reporting feeling better however upon performing transfer and one standing task pt began to look nauseated and reports feeling that she may vomit, therefore assisted into sitting and provided emesis bag and ended session.  Rescheduled for Wednesday to meet frequency.     Harriet ButteEmily Lakashia Collison, PT, MPT [facility] [address] [place], [place], [address] Phone: [phone]   Fax:  [phone] 07/11/16, 5:06 PM

## 2016-07-12 ENCOUNTER — Ambulatory Visit: Payer: BC Managed Care – PPO | Admitting: Speech Pathology

## 2016-07-12 ENCOUNTER — Ambulatory Visit: Payer: BC Managed Care – PPO | Admitting: Occupational Therapy

## 2016-07-13 ENCOUNTER — Ambulatory Visit: Payer: BC Managed Care – PPO | Admitting: Physical Therapy

## 2016-07-13 ENCOUNTER — Ambulatory Visit: Payer: BC Managed Care – PPO | Admitting: Occupational Therapy

## 2016-07-13 ENCOUNTER — Encounter: Payer: Self-pay | Admitting: Physical Therapy

## 2016-07-13 DIAGNOSIS — M6281 Muscle weakness (generalized): Secondary | ICD-10-CM

## 2016-07-13 DIAGNOSIS — R278 Other lack of coordination: Secondary | ICD-10-CM

## 2016-07-13 DIAGNOSIS — I69354 Hemiplegia and hemiparesis following cerebral infarction affecting left non-dominant side: Secondary | ICD-10-CM | POA: Diagnosis not present

## 2016-07-13 DIAGNOSIS — R293 Abnormal posture: Secondary | ICD-10-CM

## 2016-07-13 DIAGNOSIS — R2689 Other abnormalities of gait and mobility: Secondary | ICD-10-CM

## 2016-07-13 DIAGNOSIS — R2681 Unsteadiness on feet: Secondary | ICD-10-CM

## 2016-07-13 NOTE — Patient Instructions (Addendum)
Bridge    Lie back, legs bent. Lift hips up as high as you can, hold for 5 seconds.  Repeat __10__ times. Do __1-2_ sessions per day.  http://pm.exer.us/55   Copyright  VHI. All rights reserved.    Functional Quadriceps: Sit to Stand    Sit on edge of chair, feet flat on floor. Stand upright, extending knees fully with tall posture. Repeat _5, progressing to 10_ times per set. Do _1_ sets per session. Do 1-2__ sessions per day.  http://orth.exer.us/735   Copyright  VHI. All rights reserved.    Functional Quadriceps: Chair Squat    Keeping feet flat on floor, shoulder width apart, squat as low as is comfortable. Use support for balance with [name] guarding you for safety as well. Repeat 10__ times per set. Do _1_ sets per session. Do _1-2_ sessions per day.  http://orth.exer.us/737   Copyright  VHI. All rights reserved.

## 2016-07-13 NOTE — Therapy (Signed)
[facility] [address] [place], [place], [address] Phone: [phone]   Fax:  [phone]  Occupational Therapy Treatment  Patient Details  Name: Shelly Mcdaniel MRN: [id number] Date of Birth: 01/19/1956 Referring Provider: Dr. Claudette Laws  Encounter Date: 07/13/2016      OT End of Session - 07/13/16 1239    Visit Number 7   Number of Visits 25   Date for OT Re-Evaluation 07/30/16   Authorization Type BC/BS   OT Start Time 1020  no charge medical issues   OT Stop Time 1050   OT Time Calculation (min) 30 min   Activity Tolerance Treatment limited secondary to medical complications (Comment)   Behavior During Therapy Flat affect      Past Medical History:  Diagnosis Date  . Diabetes mellitus without complication (HCC)   . Hyperlipidemia   . Hypertension   . Stroke (HCC)   . UTI (urinary tract infection)     Past Surgical History:  Procedure Laterality Date  . BACK SURGERY    . BREAST SURGERY    . COLONOSCOPY WITH PROPOFOL N/A 12/24/2013   Procedure: COLONOSCOPY WITH PROPOFOL;  Surgeon: Charolett Bumpers, MD;  Location: [facility];  Service: Endoscopy;  Laterality: N/A;    There were no vitals filed for this visit.                Pt was lethargic seated edge of mat following PT session reporting she was not feeling well. BP in seated 81/58. Pt was assisted into supine with legs elevated. After laying down for several mins with feet elevated BP 150/100, Pt sat up and was still no feeling well, pt's husband reports he thinks he blood sugar is low and requested a coke. Pt took several sips of coke. Pt was assisted to her car and therapist, she appeared to be feeling better.              OT Short Term Goals - 07/13/16 1025      OT SHORT TERM GOAL #1   Title (P)  Pt/family independent with initial HEP for LUE (due 06/29/16)   Time (P)  4   Period (P)  Weeks   Status (P)   On-going     OT SHORT TERM GOAL #2   Title (P)  Pt/family to verbalize understanding with proper positioning of LUE during activity and in bed to prevent pain including use of sling /splint prn   Time (P)  4   Period (P)  Weeks   Status (P)  On-going     OT SHORT TERM GOAL #3   Title (P)  Pt to perform UB dressing with min assist using hemi techniques   Time (P)  4   Period (P)  Weeks           OT Long Term Goals - 06/15/16 1250      OT LONG TERM GOAL #1   Title Independent with updated HEP prn (all LTG's due 07/30/15)   Time 8   Period Weeks   Status On-going     OT LONG TERM GOAL #2   Title Pt to demo active low range sh. flexion to 25 degrees in prep for reaching and use of arm as stabilizer   Time 8   Period Weeks   Status On-going     OT LONG TERM GOAL #3   Title Pt to demo 25% gross finger flexion/extension to grasp/release cylindrical objects  Time 8   Period Weeks   Status On-going     OT LONG TERM GOAL #4   Title Pt will use LUE as a stabilizer for ADLs with min v.c. and pain less than or equal to 3/10   Time 8   Period Weeks   Status On-going     OT LONG TERM GOAL #5   Title Pt will perfrom toilet and shower transfers with min A.   Time 8   Period Weeks   Status On-going               Plan - 07/13/16 1237    Clinical Impression Statement Pt progress is limited by medical issues, will continue to progress as able.   Rehab Potential Fair   OT Frequency 3x / week   OT Duration 8 weeks   OT Treatment/Interventions Self-care/ADL training;Moist Heat;DME and/or AE instruction;Splinting;Patient/family education;Therapeutic exercises;Compression bandaging;Therapeutic activities;Neuromuscular education;Functional Mobility Training;Passive range of motion;Cognitive remediation/compensation;Electrical Stimulation;Manual Therapy;Dry needling;Visual/perceptual remediation/compensation   Plan monitor BP, NMR   Consulted and Agree with Plan of Care Patient    Family Member Consulted Husband - [name]      Patient will benefit from skilled therapeutic intervention in order to improve the following deficits and impairments:  Decreased activity tolerance, Decreased balance, Decreased cognition, Decreased knowledge of use of DME, Decreased coordination, Decreased endurance, Decreased range of motion, Decreased safety awareness, Decreased skin integrity, Decreased strength, Impaired perceived functional ability, Increased edema, Impaired sensation, Impaired tone, Impaired UE functional use, Impaired vision/preception, Pain, Improper body mechanics, Decreased mobility, Abnormal gait, Difficulty walking, Decreased knowledge of precautions  Visit Diagnosis: Hemiplegia and hemiparesis following cerebral infarction affecting left non-dominant side (HCC)  Muscle weakness (generalized)    Problem List Patient Active Problem List   Diagnosis Date Noted  . 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 Orthostatic hypotension 04/08/2016  . Muscle cramps 04/08/2016  . 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 Cerebral thrombosis with cerebral infarction 04/06/2016  . Basilar artery stenosis   . Dysarthria 04/05/2016  . History of CVA with residual deficit 04/05/2016  . Sinus tachycardia 04/05/2016  . Acute encephalopathy 04/05/2016  . Chronic pain 04/05/2016  . Hyperlipidemia 04/05/2016  . Ischemic stroke (HCC)   . 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 TIA (transient ischemic attack) 11/07/2014  . Uncontrolled hypertension 11/07/2014  . DM II (diabetes mellitus, type II), controlled (HCC) 11/07/2014    RINE,KATHRYN 07/13/2016, 12:44 PM Keene BreathKathryn Rine, OTR/L [phone] Phone: [phone] 12:46 PM 07/13/16 [facility] [address] [place], [place], [address] Phone: [phone]   Fax:  [phone]  Name:  Shelly Mcdaniel MRN: [id number] Date of Birth: Aug 31, 1955

## 2016-07-13 NOTE — Therapy (Signed)
[facility] [address] [place], [place], [address] Phone: [phone]   Fax:  [phone]  Physical Therapy Treatment  Patient Details  Name: Shelly Mcdaniel MRN: [id number] Date of Birth: Oct 12, 1955 Referring Provider: Rosalin Hawking, MD and Alysia Penna, MD  Encounter Date: 07/13/2016      PT End of Session - 07/13/16 0944    Visit Number 6   Number of Visits 21   Date for PT Re-Evaluation 08/01/16   Authorization Type BCBS 0 visit limit, 0 auth   PT Start Time [phone]  pt late for appt today   PT Stop Time 1015   PT Time Calculation (min) 36 min   Equipment Utilized During Treatment Gait belt   Activity Tolerance Patient tolerated treatment well   Behavior During Therapy [facility] for tasks assessed/performed      Past Medical History:  Diagnosis Date  . Diabetes mellitus without complication ([place])   . Hyperlipidemia   . Hypertension   . Stroke ([place])   . UTI (urinary tract infection)     Past Surgical History:  Procedure Laterality Date  . BACK SURGERY    . BREAST SURGERY    . COLONOSCOPY WITH PROPOFOL N/A 12/24/2013   Procedure: COLONOSCOPY WITH PROPOFOL;  Surgeon: [name], MD;  Location: [facility];  Service: Endoscopy;  Laterality: N/A;    There were no vitals filed for this visit.      Subjective Assessment - 07/13/16 0941    Subjective Reports she vomited this am due to reflux. Spouse reports she is taking medication for this. Spouse reports pt's BP was 150/82 at home this am. Pt states she is tired, just woke up "a little while ago".    Patient is accompained by: Family member   Limitations House hold activities;Walking   Patient Stated Goals "I want to work on my left extremity."    Currently in Pain? No/denies   Pain Score 0-No pain            [facility] Adult PT Treatment/Exercise - 07/13/16 0945      Bed Mobility   Rolling Right 5: Supervision   Rolling Right Details (indicate  cue type and reason) cues needed on technique/sequencing, increased time needed   Rolling Left 5: Supervision   Rolling Left Details (indicate cue type and reason) cues on technique/sequencing, increased time needed   Supine to Sit 5: Supervision   Supine to Sit Details (indicate cue type and reason) pt able to bring both legs off edge of mat and use right UE to elevate trunk into seated positon x 2 reps with increased time needed   Sit to Supine 4: Min assist  to clear mat surface with LE's   Sit to Supine - Details (indicate cue type and reason) pt needs assist to clear mat surface with left leg> right leg when lying down x 2 reps.      Transfers   Transfers Sit to Stand;Stand to Sit   Sit to Stand 4: Min assist;With upper extremity assist;With armrests;From chair/3-in-1   Sit to Stand Details Verbal cues for sequencing;Verbal cues for technique;Verbal cues for precautions/safety;Manual facilitation for weight shifting;Manual facilitation for weight bearing   Sit to Stand Details (indicate cue type and reason) cues to scoot to edge, for weight shifting and for upright posture once standing   Stand to Sit 4: Min assist;With upper extremity assist;To bed   Stand to Sit Details (indicate cue type and reason) Verbal cues  for sequencing;Verbal cues for technique;Verbal cues for precautions/safety;Verbal cues for safe use of DME/AE;Manual facilitation for weight bearing;Manual facilitation for weight shifting   Stand to Sit Details cues to reach back for controlled descent with sitting down   Squat Pivot Transfers 4: Min assist   Squat Pivot Transfer Details (indicate cue type and reason) with PFWR transfered from wheelchair to mat table, cues on posture, sequencing and to advance feet with transfer.    Number of Reps 10 reps;1 set   Comments had chair in front of pt for her to stabilize on once standing, cues each time for full upright posture., cues each time to reach back and use arm to control  descent     Ambulation/Gait   Ambulation/Gait --            PT Education - 07/13/16 1013    Education provided Yes   Education Details HEP for strengthening   Person(s) Educated Patient;Spouse   Methods Explanation;Demonstration;Tactile cues;Verbal cues;Handout   Comprehension Verbalized understanding;Returned demonstration;Verbal cues required;Need further instruction;Tactile cues required          PT Short Term Goals - 07/13/16 0944      PT SHORT TERM GOAL #1   Title Pt will initiate HEP in order to indicate improved functional mobility and decreased fall risk.  (Target Date: 06/30/16)   Baseline 07/13/16: met today as HEP was issued today   Time --   Period --   Status Achieved     PT SHORT TERM GOAL #2   Title Pt will perform stand pivot transfer w/ LRAD at S level in order to increase independence at home.     Baseline 1/301/18: pt continues to need up to min assist both with and without AD (per previous notes for without AD, used AD today)   Time --   Period --   Status Not Met     PT SHORT TERM GOAL #3   Title Pt will perform bed mobility at mod I level in order to indicate improved functional independence.     Baseline 07/13/16: pt at supervision level mostly, continues to need assistance to lift left LE onto surface and cues on sequencing/technique placing her at supervision   Time --   Period --   Status Partially Met     PT SHORT TERM GOAL #4   Title Pt will ambulate 100' w/ LRAD at min A level in order to indicate improved household ambulation.     Time 4   Period Weeks   Status On-going     PT SHORT TERM GOAL #5   Title Pt will perform PASS and improve score 3 points in order to indicate improved functional mobility.     Time 4   Period Weeks   Status On-going           PT Long Term Goals - 06/02/16 1910      PT LONG TERM GOAL #1   Title Pt/spouse will be independent with HEP in order to indicate improved functional mobility and decreased  fall risk.  (Target Date: 07/28/16)   Time 8   Period Weeks   Status New     PT LONG TERM GOAL #2   Title Pt will perform stand pivot transfers w/ LRAD at mod I level in order to indicate improved functional independence.    Time 8   Period Weeks   Status New     PT LONG TERM GOAL #3   Title Pt will  ambulate x 200' w/ LRAD at S level over indoor surfaces in order to increase independence with household ambulation.     Time 8   Period Weeks   Status New     PT LONG TERM GOAL #4   Title Pt will perform dynamic standing balance with intermittent UE support x 5 mins in order to increase independence with ADLs.    Time 8   Period Weeks   Status New     PT LONG TERM GOAL #5   Title Pt will ambulate over paved outdoor surfaces w/ LRAD x 200' at S level in order to indicate initiation of community ambulation.    Time 8   Period Weeks   Status New           Plan - 07/13/16 0944    Clinical Impression Statement Pt has met STG #1, partailly met #3 and did not meet #2. HEP was initiated with today's session without issues. Pt was tired with session today, needed constant cues for upright posture in unsupported sitting as she was propping herself on chair back and right UE. Pt should benefit from continued PT to progress toward unmet goals .                        Rehab Potential Good   Clinical Impairments Affecting Rehab Potential pt motivated by may be limited due to medical complexity and poor cognition   PT Frequency 2x / week  then 3x/wk for 4 weeks   PT Duration 4 weeks  then 3x/wk for 4 week   PT Treatment/Interventions ADLs/Self Care Home Management;Canalith Repostioning;Electrical Stimulation;DME Instruction;Stair training;Gait training;Functional mobility training;Therapeutic activities;Therapeutic exercise;Balance training;Neuromuscular re-education;Patient/family education;Orthotic Fit/Training;Passive range of motion;Energy conservation;Taping;Vestibular;Visual/perceptual  remediation/compensation   PT Next Visit Plan check remaining STGs, Work on sit<>stand with pt and also with pt/husband, stand pivot transfers with device when able (also practice with pt and husband), gait with L PFRW, initiate SIMPLE HEP    Consulted and Agree with Plan of Care Patient;Family member/caregiver   Family Member Consulted Husband [name]      Patient will benefit from skilled therapeutic intervention in order to improve the following deficits and impairments:  Abnormal gait, Decreased activity tolerance, Decreased balance, Decreased cognition, Decreased coordination, Decreased endurance, Decreased knowledge of precautions, Decreased knowledge of use of DME, Decreased mobility, Decreased safety awareness, Decreased strength, Impaired perceived functional ability, Impaired flexibility, Impaired sensation, Impaired tone, Impaired UE functional use, Improper body mechanics, Postural dysfunction  Visit Diagnosis: Hemiplegia and hemiparesis following cerebral infarction affecting left non-dominant side (HCC)  Muscle weakness (generalized)  Abnormal posture  Unsteadiness on feet  Other abnormalities of gait and mobility  Other lack of coordination     Problem List Patient Active Problem List   Diagnosis Date Noted  . 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 Orthostatic hypotension 04/08/2016  . Muscle cramps 04/08/2016  . Right pontine cerebrovascular accident ([place]) 04/08/2016  . Spastic hemiparesis of left nondominant side ([place]) 04/08/2016  . Cerebral thrombosis with cerebral infarction 04/06/2016  . Basilar artery stenosis   . Dysarthria 04/05/2016  . History of CVA with residual deficit 04/05/2016  . Sinus tachycardia 04/05/2016  . Acute encephalopathy 04/05/2016  . Chronic pain 04/05/2016  . Hyperlipidemia 04/05/2016  . Ischemic stroke ([place])   . 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 TIA (transient ischemic attack) 11/07/2014  . Uncontrolled hypertension 11/07/2014  . DM  II (diabetes mellitus, type II), controlled ([place]) 11/07/2014  Willow Ora, PTA, [facility] [address] [place], [place] [address] [phone] 07/13/16, 11:59 AM   Name: Shelly Mcdaniel MRN: [id number] Date of Birth: 1955-06-30

## 2016-07-14 ENCOUNTER — Ambulatory Visit: Payer: BC Managed Care – PPO | Attending: Physical Medicine and Rehabilitation | Admitting: Occupational Therapy

## 2016-07-14 ENCOUNTER — Ambulatory Visit: Payer: BC Managed Care – PPO | Admitting: Rehabilitation

## 2016-07-14 ENCOUNTER — Encounter: Payer: Self-pay | Admitting: Rehabilitation

## 2016-07-14 ENCOUNTER — Telehealth: Payer: Self-pay | Admitting: Neurology

## 2016-07-14 ENCOUNTER — Telehealth: Payer: Self-pay | Admitting: Rehabilitation

## 2016-07-14 ENCOUNTER — Ambulatory Visit (HOSPITAL_BASED_OUTPATIENT_CLINIC_OR_DEPARTMENT_OTHER): Payer: BC Managed Care – PPO | Admitting: Physical Medicine & Rehabilitation

## 2016-07-14 ENCOUNTER — Ambulatory Visit: Payer: BC Managed Care – PPO

## 2016-07-14 ENCOUNTER — Encounter: Payer: BC Managed Care – PPO | Attending: Physical Medicine & Rehabilitation

## 2016-07-14 ENCOUNTER — Encounter: Payer: Self-pay | Admitting: Physical Medicine & Rehabilitation

## 2016-07-14 VITALS — BP 145/84 | HR 103 | Resp 16

## 2016-07-14 DIAGNOSIS — R2681 Unsteadiness on feet: Secondary | ICD-10-CM

## 2016-07-14 DIAGNOSIS — R2689 Other abnormalities of gait and mobility: Secondary | ICD-10-CM

## 2016-07-14 DIAGNOSIS — M6281 Muscle weakness (generalized): Secondary | ICD-10-CM

## 2016-07-14 DIAGNOSIS — R41841 Cognitive communication deficit: Secondary | ICD-10-CM | POA: Diagnosis present

## 2016-07-14 DIAGNOSIS — I10 Essential (primary) hypertension: Secondary | ICD-10-CM | POA: Insufficient documentation

## 2016-07-14 DIAGNOSIS — R41842 Visuospatial deficit: Secondary | ICD-10-CM | POA: Diagnosis present

## 2016-07-14 DIAGNOSIS — R4182 Altered mental status, unspecified: Secondary | ICD-10-CM | POA: Insufficient documentation

## 2016-07-14 DIAGNOSIS — R278 Other lack of coordination: Secondary | ICD-10-CM

## 2016-07-14 DIAGNOSIS — I69354 Hemiplegia and hemiparesis following cerebral infarction affecting left non-dominant side: Secondary | ICD-10-CM | POA: Insufficient documentation

## 2016-07-14 DIAGNOSIS — I651 Occlusion and stenosis of basilar artery: Secondary | ICD-10-CM | POA: Diagnosis not present

## 2016-07-14 DIAGNOSIS — E1140 Type 2 diabetes mellitus with diabetic neuropathy, unspecified: Secondary | ICD-10-CM | POA: Insufficient documentation

## 2016-07-14 DIAGNOSIS — Z794 Long term (current) use of insulin: Secondary | ICD-10-CM | POA: Diagnosis not present

## 2016-07-14 DIAGNOSIS — M79602 Pain in left arm: Secondary | ICD-10-CM | POA: Insufficient documentation

## 2016-07-14 DIAGNOSIS — M25512 Pain in left shoulder: Secondary | ICD-10-CM | POA: Insufficient documentation

## 2016-07-14 DIAGNOSIS — I69315 Cognitive social or emotional deficit following cerebral infarction: Secondary | ICD-10-CM | POA: Diagnosis present

## 2016-07-14 DIAGNOSIS — R471 Dysarthria and anarthria: Secondary | ICD-10-CM | POA: Diagnosis present

## 2016-07-14 DIAGNOSIS — E785 Hyperlipidemia, unspecified: Secondary | ICD-10-CM | POA: Diagnosis not present

## 2016-07-14 DIAGNOSIS — G8114 Spastic hemiplegia affecting left nondominant side: Secondary | ICD-10-CM

## 2016-07-14 DIAGNOSIS — R293 Abnormal posture: Secondary | ICD-10-CM | POA: Diagnosis present

## 2016-07-14 NOTE — Therapy (Signed)
[facility] [address] [place], [place], [address] Phone: [phone]   Fax:  [phone]  Speech Language Pathology Treatment  Patient Details  Name: Shelly Mcdaniel MRN: [id number] Date of Birth: Jun 03, 1956 Referring Provider: Claudette LawsKirsteins, Andrew, MD  Encounter Date: 07/14/2016      End of Session - 07/14/16 1600    Visit Number 7   Number of Visits 17   Date for SLP Re-Evaluation 08/19/16   SLP Start Time 1404   SLP Stop Time  1441   SLP Time Calculation (min) 37 min   Activity Tolerance Patient limited by fatigue      Past Medical History:  Diagnosis Date  . Diabetes mellitus without complication (HCC)   . Hyperlipidemia   . Hypertension   . Stroke (HCC)   . UTI (urinary tract infection)     Past Surgical History:  Procedure Laterality Date  . BACK SURGERY    . BREAST SURGERY    . COLONOSCOPY WITH PROPOFOL N/A 12/24/2013   Procedure: COLONOSCOPY WITH PROPOFOL;  Surgeon: Charolett BumpersMartin K Johnson, MD;  [facility];  Service: Endoscopy;  Laterality: N/A;    There were no vitals filed for this visit.      Subjective Assessment - 07/14/16 1526    Subjective Pt arrives very somnolent to ST today.    Currently in Pain? No/denies               ADULT SLP TREATMENT - 07/14/16 1555      General Information   Behavior/Cognition Cooperative;Pleasant mood;Distractible;Decreased sustained attention  somnolent intermittently   Patient Positioning Upright in chair     Treatment Provided   Treatment provided Cognitive-Linquistic     Cognitive-Linquistic Treatment   Treatment focused on Cognition   Skilled Treatment Pt's attention (sustained) was targeted today in simple linguistic tasks. Pt req'd consistent demo and verbal cues for sustained attention/selective attention. Pt was notably somnolent and SLP attempted to vary tasks to assist with pt arousal with limited success.     Assessment /  Recommendations / Plan   Plan Continue with current plan of care     Progression Toward Goals   Progression toward goals Not progressing toward goals (comment)  due to decr'd attention, and somnolence today          SLP Education - 07/14/16 1559    Education provided Yes   Education Details home tasks   Person(s) Educated Spouse;Patient   Methods Explanation;Demonstration   Comprehension Verbalized understanding;Need further instruction  pt requires further instruction          SLP Short Term Goals - 07/14/16 1602      SLP SHORT TERM GOAL #1   Title pt will demo sustained/selective attention in NOVEL therapy tasks in a min-mod noisy environment for 10 minutes, with rare min A back to task   Time 1   Period Weeks   Status On-going     SLP SHORT TERM GOAL #2   Title pt will demo awareness of errors in novel therapy tasks, 60% of the time, over three sessions   Time 1   Period Weeks   Status On-going     SLP SHORT TERM GOAL #3   Title pt will demo exercises for incr'd breath support with occasional min A over three sessions   Time 1   Period Weeks   Status On-going          SLP Long Term Goals - 07/14/16 [id number]  SLP LONG TERM GOAL #1   Title pt will demo alternating attention with simple therapy tasks with rare min A needed for redirection/finding place back within the task   Time 5   Period Weeks   Status On-going     SLP LONG TERM GOAL #2   Title pt will exhibit emergent awareness in simple to mod complex NOVEL cognitive linguistic tasks 90% of the time over three sessions   Time 5   Period Weeks   Status On-going     SLP LONG TERM GOAL #3   Title pt will demonstrate WNL breath support for multiple sentence responses in 10 minute conversation over three sessions    Time 5   Period Weeks   Status On-going          Plan - 07/14/16 1601    Clinical Impression Statement Pt continues to present today with deficits in cognitive-linguistics  specifically attention skills and awareness. Pt demonstrated decr'd breath support again today resulting in low speech volume at the phrase level. She was difficult to keep awake today. She would beneift from skilled ST focusing on cognitive linguistics and dysarthria to decr caregiver burden and improve overall communication skills with family and community members.   Speech Therapy Frequency 2x / week   Duration --  8 weeks, or 16 therapy visits   Treatment/Interventions Patient/family education;Compensatory techniques;Internal/external aids;SLP instruction and feedback;Cognitive reorganization;Compensatory strategies;Oral motor exercises;Cueing hierarchy;Functional tasks  any or all may be used   Potential to Achieve Goals Good   Consulted and Agree with Plan of Care Patient;Family member/caregiver   Family Member Consulted husband      Patient will benefit from skilled therapeutic intervention in order to improve the following deficits and impairments:   Cognitive communication deficit  Dysarthria and anarthria    Problem List Patient Active Problem List   Diagnosis Date Noted  . 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 Orthostatic hypotension 04/08/2016  . Muscle cramps 04/08/2016  . 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 Cerebral thrombosis with cerebral infarction 04/06/2016  . Basilar artery stenosis   . Dysarthria 04/05/2016  . History of CVA with residual deficit 04/05/2016  . Sinus tachycardia 04/05/2016  . Acute encephalopathy 04/05/2016  . Chronic pain 04/05/2016  . Hyperlipidemia 04/05/2016  . Ischemic stroke (HCC)   . 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 TIA (transient ischemic attack) 11/07/2014  . Uncontrolled hypertension 11/07/2014  . DM II (diabetes mellitus, type II), controlled (HCC) 11/07/2014    Allen Basista ,MS, CCC-SLP  07/14/2016, 4:03 PM  [facility] [address] [place], [place], [address] Phone: [phone]   Fax:  [phone]   Name: Shelly Mcdaniel MRN: [id number] Date of Birth: 01/14/1956

## 2016-07-14 NOTE — Therapy (Signed)
[facility] [address] [place], [place], [address] Phone: [phone]   Fax:  [phone]  Physical Therapy Treatment  Patient Details  Name: Shelly Mcdaniel MRN: [id number] Date of Birth: 1955-08-22 Referring Provider: Rosalin Hawking, MD and Alysia Penna, MD  Encounter Date: 07/14/2016      PT End of Session - 07/14/16 1840    Visit Number 7   Number of Visits 21   Date for PT Re-Evaluation 08/01/16   Authorization Type BC[employer] 0 visit limit, 0 auth   PT Start Time 1402   PT Stop Time 1445   PT Time Calculation (min) 43 min   Equipment Utilized During Treatment Gait belt   Activity Tolerance Patient tolerated treatment well   Behavior During Therapy [facility] for tasks assessed/performed      Past Medical History:  Diagnosis Date  . Diabetes mellitus without complication ([place])   . Hyperlipidemia   . Hypertension   . Stroke ([place])   . UTI (urinary tract infection)     Past Surgical History:  Procedure Laterality Date  . BACK SURGERY    . BREAST SURGERY    . COLONOSCOPY WITH PROPOFOL N/A 12/24/2013   Procedure: COLONOSCOPY WITH PROPOFOL;  Surgeon: [name], MD;  Location: [facility];  Service: Endoscopy;  Laterality: N/A;    There were no vitals filed for this visit.      Subjective Assessment - 07/14/16 1518    Subjective Reports feeling better, still having some vomiting due to "reflux" however is taking medication.     Patient is accompained by: Family member   Limitations House hold activities;Walking   Patient Stated Goals "I want to work on my left extremity."    Currently in Pain? Yes   Pain Score 1    Pain Location Shoulder   Pain Orientation Left   Pain Descriptors / Indicators Aching   Pain Type Chronic pain   Pain Onset More than a month ago   Pain Frequency Intermittent   Aggravating Factors  dependent position   Pain Relieving Factors repositioning                          [facility] Adult PT Treatment/Exercise - 07/14/16 0001      Bed Mobility   Bed Mobility Left Sidelying to Sit   Rolling Left 4: Min assist   Rolling Left Details (indicate cue type and reason) Cues for sequencing and hand placement   Left Sidelying to Sit 4: Min assist   Left Sidelying to Sit Details (indicate cue type and reason) Pt initially reaching for husband to get up to EOM, however PT had her practice again with constant cues for rolling technique and lowering LEs off EOM as well as cues for hand placement.      Transfers   Transfers Sit to Stand;Stand to Sit   Sit to Stand 4: Min assist;3: Mod assist;With upper extremity assist   Sit to Stand Details Verbal cues for sequencing;Verbal cues for technique;Verbal cues for precautions/safety;Manual facilitation for weight shifting;Manual facilitation for weight bearing;Manual facilitation for placement   Sit to Stand Details (indicate cue type and reason) Cues for attention to foot placement, facilitation for upright posture and forward trunk lean at hips   Stand to Sit 4: Min assist;With upper extremity assist;To bed   Stand to Sit Details (indicate cue type and reason) Verbal cues for sequencing;Verbal cues for technique;Verbal cues for precautions/safety;Verbal cues for  safe use of DME/AE;Manual facilitation for weight bearing;Manual facilitation for weight shifting   Stand to Sit Details Cues for posture and forward trunk lean   Stand Pivot Transfers 3: Mod assist   Stand Pivot Transfer Details (indicate cue type and reason) Cues for sequencing and foot placement as well as tactile cues for L knee extension during transfer     Self-Care   Self-Care Other Self-Care Comments   Other Self-Care Comments  BP following vomiting was 183/119, after being supine for approx 2 mins was 162/102, sitting on EOM x 3 mins was 117/87 and standing was 115/75.  Discussed this with pt and husband that PT feels she is  becoming very orthostatic and her only symptom (or only reported symptom) is when she feels like she is going to vomit.   PT to send note to MD regarding PT.       Neuro Re-ed    Neuro Re-ed Details  Performed standing weight shifting to the R with upward and R reach to decrease pusher/perceptual deficits.  Pt able to perform approx 10 reaches for targets before needing to sit due to feeling nauseated.  See self care details for BP readings.                 PT Education - 07/14/16 1839    Education provided Yes   Education Details PT contacting MD to notify of BP changes during upright mobility.    Person(s) Educated Patient;Spouse   Methods Explanation   Comprehension Verbalized understanding          PT Short Term Goals - 07/14/16 1840      PT SHORT TERM GOAL #1   Title Pt will initiate HEP in order to indicate improved functional mobility and decreased fall risk.  (Target Date: 06/30/16)   Baseline 07/13/16: met today as HEP was issued today   Status Achieved     PT SHORT TERM GOAL #2   Title Pt will perform stand pivot transfer w/ LRAD at S level in order to increase independence at home.     Baseline 1/301/18: pt continues to need up to min assist both with and without AD (per previous notes for without AD, used AD today)   Status Not Met     PT SHORT TERM GOAL #3   Title Pt will perform bed mobility at mod I level in order to indicate improved functional independence.     Baseline 07/13/16: pt at supervision level mostly, continues to need assistance to lift left LE onto surface and cues on sequencing/technique placing her at supervision   Status Partially Met     PT SHORT TERM GOAL #4   Title Pt will ambulate 100' w/ LRAD at min A level in order to indicate improved household ambulation.     Time 4   Period Weeks   Status Not Met     PT SHORT TERM GOAL #5   Title Pt will perform PASS and improve score 3 points in order to indicate improved functional mobility.      Time 4   Period Weeks   Status On-going           PT Long Term Goals - 06/02/16 1910      PT LONG TERM GOAL #1   Title Pt/spouse will be independent with HEP in order to indicate improved functional mobility and decreased fall risk.  (Target Date: 07/28/16)   Time 8   Period Weeks   Status New  PT LONG TERM GOAL #2   Title Pt will perform stand pivot transfers w/ LRAD at mod I level in order to indicate improved functional independence.    Time 8   Period Weeks   Status New     PT LONG TERM GOAL #3   Title Pt will ambulate x 200' w/ LRAD at S level over indoor surfaces in order to increase independence with household ambulation.     Time 8   Period Weeks   Status New     PT LONG TERM GOAL #4   Title Pt will perform dynamic standing balance with intermittent UE support x 5 mins in order to increase independence with ADLs.    Time 8   Period Weeks   Status New     PT LONG TERM GOAL #5   Title Pt will ambulate over paved outdoor surfaces w/ LRAD x 200' at S level in order to indicate initiation of community ambulation.    Time 8   Period Weeks   Status New               Plan - 07/14/16 1841    Clinical Impression Statement Skilled session to address standing balance, weight shifts and postural control along with decreasing pusher tendencies.  Note that once in standing, she became nauseated and needing to vomit, therefore PT checked vitals (see self care section) and appears to be very orthostatic.  PT sent note to MD to notify him regarding BP changes.     Rehab Potential Good   Clinical Impairments Affecting Rehab Potential pt motivated by may be limited due to medical complexity and poor cognition   PT Frequency 2x / week  then 3x/wk for 4 weeks   PT Duration 4 weeks  then 3x/wk for 4 week   PT Treatment/Interventions ADLs/Self Care Home Management;Canalith Repostioning;Electrical Stimulation;DME Instruction;Stair training;Gait training;Functional  mobility training;Therapeutic activities;Therapeutic exercise;Balance training;Neuromuscular re-education;Patient/family education;Orthotic Fit/Training;Passive range of motion;Energy conservation;Taping;Vestibular;Visual/perceptual remediation/compensation   PT Next Visit Plan Did MD provide any advice regarding BP, continue to check compliance with HEP, sit<>stand, transfers, standing balance to decrease pusher tendencies   Consulted and Agree with Plan of Care Patient;Family member/caregiver   Family Member Consulted Husband [name]      Patient will benefit from skilled therapeutic intervention in order to improve the following deficits and impairments:  Abnormal gait, Decreased activity tolerance, Decreased balance, Decreased cognition, Decreased coordination, Decreased endurance, Decreased knowledge of precautions, Decreased knowledge of use of DME, Decreased mobility, Decreased safety awareness, Decreased strength, Impaired perceived functional ability, Impaired flexibility, Impaired sensation, Impaired tone, Impaired UE functional use, Improper body mechanics, Postural dysfunction  Visit Diagnosis: Hemiplegia and hemiparesis following cerebral infarction affecting left non-dominant side (HCC)  Muscle weakness (generalized)  Abnormal posture  Unsteadiness on feet  Other abnormalities of gait and mobility     Problem List Patient Active Problem List   Diagnosis Date Noted  . 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 Orthostatic hypotension 04/08/2016  . Muscle cramps 04/08/2016  . Right pontine cerebrovascular accident ([place]) 04/08/2016  . Spastic hemiparesis of left nondominant side ([place]) 04/08/2016  . Cerebral thrombosis with cerebral infarction 04/06/2016  . Basilar artery stenosis   . Dysarthria 04/05/2016  . History of CVA with residual deficit 04/05/2016  . Sinus tachycardia 04/05/2016  . Acute encephalopathy 04/05/2016  . Chronic pain 04/05/2016  .  Hyperlipidemia 04/05/2016  . Ischemic stroke ([place])   . 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 TIA (transient ischemic attack) 11/07/2014  .  Uncontrolled hypertension 11/07/2014  . DM II (diabetes mellitus, type II), controlled ([place]) 11/07/2014    Cameron Sprang, PT, MPT [facility] [address] [place], [place], [address] Phone: [phone]   Fax:  [phone] 07/14/16, 6:45 PM  Name: Shelly Mcdaniel MRN: [id number] Date of Birth: Nov 02, 1955

## 2016-07-14 NOTE — Telephone Encounter (Signed)
Shelly RotaDaniel Mcdaniel  Spouse [phone]  [name] stop by, stating that they had just left PT and that they were suspending Therapy until she was seen by neurologist. She has an appointment on 08/31/16, he is requesting sooner appointment. He also stated that therapy would be sending over notes today about this.

## 2016-07-14 NOTE — Telephone Encounter (Signed)
Message sent to Dr.Xu about pts blood pressure and therapy being suspended.

## 2016-07-14 NOTE — Telephone Encounter (Signed)
Dr. Roda ShuttersXu,   As you know we [facility]SLP [facility].  Note that today (and also Monday-however I did not get readings) she is becoming orthostatic to the point of vomiting.  Note that sitting while vomiting her BP was 183/119, lying supine was 162/102, sitting back up to edge of mat was 117/87 and then standing again manually BP was 110/75.  We have only been able to work in standing for approx 3-5 mins before she becomes symptomatic.  Just wanted to keep you in the loop as this is making it hard to progress in therapy.  Also note that husband Jesusita OkaDan would like to see you sooner than scheduled follow up in March.    Thanks,  Harriet ButteEmily Omayra Tulloch, PT, MPT [facility] [address] [place], [place], [address] Phone: [phone]   Fax:  [phone] 07/14/16, 3:09 PM

## 2016-07-14 NOTE — Progress Notes (Addendum)
Subjective:    Patient ID: Shelly Mcdaniel, female    DOB: [birth year], 61 y.o.   MRN: [id number]  HPI Has had several UTIs, has been seen by Urologist 2 days ago, no retention, didn't feel it was stroke related Off antibiotic now.  Nauseated for the last week, had some issues with vomiting.  Has been using Zofran on an intermittent basis Vomiting with supine position Some gagging this am.   CBG 76 this am BP ok  Left shoulder pain improved after injection 1/8 Reviewed OT notes  Pain Inventory Average Pain 3 Pain Right Now 1 My pain is intermittent  In the last 24 hours, has pain interfered with the following? General activity 3 Relation with others 5 Enjoyment of life 3 What TIME of day is your pain at its worst? morning Sleep (in general) Fair  Pain is worse with: unsure Pain improves with: no answer Relief from Meds: 7  Mobility use a walker how many minutes can you walk? 5 ability to climb steps?  no do you drive?  no use a wheelchair needs help with transfers  Function retired I need assistance with the following:  dressing, bathing, toileting, meal prep, household duties and shopping  Neuro/Psych bladder control problems bowel control problems weakness trouble walking dizziness confusion  Prior Studies Any changes since last visit?  no  Physicians involved in your care Any changes since last visit?  no   Family History  Problem Relation Age of Onset  . Stroke Mother   . Dementia Mother   . Diabetes Father   . Stroke Father   . CAD Father   . Diabetes Sister   . Diabetes Brother   . CAD Brother   . Stroke Brother   . Kidney failure Brother   . Hypertension Other   . Hyperlipidemia Other   . Stroke Other   . Heart disease Other   . Diabetes Other    Social History   Social History  . Marital status: Married    Spouse name: N/A  . Number of children: N/A  . Years of education: N/A   Social History Main Topics  . Smoking status:  Never Smoker  . Smokeless tobacco: Never Used  . Alcohol use No  . Drug use: No  . Sexual activity: Not Asked   Other Topics Concern  . None   Social History Narrative  . None   Past Surgical History:  Procedure Laterality Date  . BACK SURGERY    . BREAST SURGERY    . COLONOSCOPY WITH PROPOFOL N/A 12/24/2013   Procedure: COLONOSCOPY WITH PROPOFOL;  Surgeon: Charolett Bumpers, MD;  Location: [facility];  Service: Endoscopy;  Laterality: N/A;   Past Medical History:  Diagnosis Date  . Diabetes mellitus without complication (HCC)   . Hyperlipidemia   . Hypertension   . Stroke (HCC)   . UTI (urinary tract infection)    BP (!) 145/84   Pulse (!) 103   Resp 16   SpO2 98%   Opioid Risk Score:   Fall Risk Score:  `1  Depression screen PHQ 2/9  Depression screen [facility] 2/9 07/14/2016 05/12/2016 01/15/2016 08/20/2015 05/27/2015  Decreased Interest 0 3 0 0 0  Down, Depressed, Hopeless 0 0 0 0 0  PHQ - 2 Score 0 3 0 0 0  Altered sleeping - 2 - - -  Tired, decreased energy - 3 - - -  Feeling bad or failure about yourself  - 0 - - -  Trouble concentrating - 3 - - -  Moving slowly or fidgety/restless - 2 - - -  Suicidal thoughts - 0 - - -  PHQ-9 Score - 13 - - -  Difficult doing work/chores - Very difficult - - -    Review of Systems  Constitutional: Positive for appetite change and unexpected weight change.  HENT: Negative.   Eyes: Negative.   Respiratory: Negative.   Cardiovascular: Negative.   Gastrointestinal: Positive for abdominal pain, constipation, nausea and vomiting.  Endocrine:       High blood sugars  Genitourinary: Positive for difficulty urinating.  Musculoskeletal: Positive for gait problem.  Skin: Negative.   Allergic/Immunologic: Negative.   Neurological: Positive for dizziness.  Hematological: Negative.   Psychiatric/Behavioral: Positive for confusion.  All other systems reviewed and are negative.      Objective:   Physical Exam  Constitutional: She  appears well-developed and well-nourished.  HENT:  Head: Normocephalic and atraumatic.  Eyes: Conjunctivae are normal. Pupils are equal, round, and reactive to light.  Neck: Normal range of motion.  Cardiovascular: Normal rate, regular rhythm and normal heart sounds.   No murmur heard. Pulmonary/Chest: Effort normal and breath sounds normal. No respiratory distress. She has no wheezes.  Abdominal: Soft. Bowel sounds are normal. She exhibits no distension. There is no tenderness.  Neurological: She is alert.  Patient requires mod assist for transfers  Tone Ashworth grade 3 at the finger flexors and thumb flexor Ashworth 1-2 at the left bicep. Ashworth 3 at the pronator. Ashworth, 0 at the wrist flexor  Psychiatric: Her affect is labile. Her speech is delayed and slurred.  Nursing note and vitals reviewed.         Assessment & Plan:  1. 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 We discussed that it would not produce any improvement in motor strength. It may reduce her need, however, to wear her wrist hand orthosis which she dislikes.    Botox Injection for spasticity using needle EMG guidance  Dilution: 50 Units/ml Indication: Severe spasticity which interferes with ADL,mobility and/or  hygiene and is unresponsive to medication management and other conservative care Informed consent was obtained after describing risks and benefits of the procedure with the patient. This includes bleeding, bruising, infection, excessive weakness, or medication side effects. A REMS form is on file and signed. Needle: 27g 1" needle electrode Number of units per muscle FDS 25 FDP 25 FPL 25 PT 25 All injections were done after obtaining appropriate EMG activity and after negative drawback for blood. The patient tolerated the procedure well. Post procedure instructions were given. A followup appointment was made.   Does not require any type  of restrictions during OT, may resume today  2. History of pontine infarct, do not see any significant change in neurologic status. Husband is concerned about some worsening of facial droop, which is subtle and is most likely related to fatigue from her muliple medical issues

## 2016-07-14 NOTE — Patient Instructions (Addendum)

## 2016-07-14 NOTE — Therapy (Signed)
[facility] [address] [place], [place], [address] Phone: [phone]   Fax:  [phone]  Occupational Therapy Treatment  Patient Details  Name: Shelly Mcdaniel MRN: [id number] Date of Birth: Aug 03, 1955 Referring Provider: Dr. Claudette LawsAndrew Kirsteins  Encounter Date: 07/14/2016      OT End of Session - 07/14/16 1643    Visit Number 8   Number of Visits 25   Date for OT Re-Evaluation 07/30/16   Authorization Type BC/BS   OT Start Time 1317   OT Stop Time 1400   OT Time Calculation (min) 43 min   Activity Tolerance Treatment limited secondary to medical complications (Comment)   Behavior During Therapy Flat affect      Past Medical History:  Diagnosis Date  . Diabetes mellitus without complication (HCC)   . Hyperlipidemia   . Hypertension   . Stroke (HCC)   . UTI (urinary tract infection)     Past Surgical History:  Procedure Laterality Date  . BACK SURGERY    . BREAST SURGERY    . COLONOSCOPY WITH PROPOFOL N/A 12/24/2013   Procedure: COLONOSCOPY WITH PROPOFOL;  Surgeon: Charolett BumpersMartin K Johnson, MD;  Location: [facility];  Service: Endoscopy;  Laterality: N/A;    There were no vitals filed for this visit.      Subjective Assessment - 07/14/16 1639    Subjective  Pt reports feeling better today, BP monitored on arrival and it was WNLS   Pertinent History see Epic, Dr Roda ShuttersXu has approved DBP less than 110 and HR 115-120 for therapy parameters.    Limitations elevated BP and orthostatic hypotension at times, monitor BP   Patient Stated Goals to be more indpendent   Currently in Pain? Yes   Pain Location Arm   Pain Orientation Left   Pain Descriptors / Indicators Aching   Pain Type Chronic pain   Pain Onset More than a month ago   Pain Frequency Intermittent   Aggravating Factors  malpositioning   Pain Relieving Factors repositioning              Treatment: Pt reports Dr. Wynn BankerKirsteins gave her botox  today. Pt/ husband brought in splint, therapist remolded thumb portion and added padding/ strapping for improved comfort and fit. Pt was encouraged to wear splint for several hours during daytime initially before transitioning to nighttime wear, and to monitor skin condition. Pt was instructed to remove splint if any problems. Pt's husband verbalizes understanding of wear, care and preacautions. Hemi dressing techniques for donning/ doffing shirt, Pt required mod -max v.c and tactile cues to complete task, pt donned shirt only halfway and the she stopped task requiring further prompting. BP following task was 133/93.                  OT Short Term Goals - 07/13/16 1025      OT SHORT TERM GOAL #1   Title (P)  Pt/family independent with initial HEP for LUE (due 06/29/16)   Time (P)  4   Period (P)  Weeks   Status (P)  On-going     OT SHORT TERM GOAL #2   Title (P)  Pt/family to verbalize understanding with proper positioning of LUE during activity and in bed to prevent pain including use of sling /splint prn   Time (P)  4   Period (P)  Weeks   Status (P)  On-going     OT SHORT TERM GOAL #3   Title (P)  Pt to perform UB dressing with min assist using hemi techniques   Time (P)  4   Period (P)  Weeks           OT Long Term Goals - 06/15/16 1250      OT LONG TERM GOAL #1   Title Independent with updated HEP prn (all LTG's due 07/30/15)   Time 8   Period Weeks   Status On-going     OT LONG TERM GOAL #2   Title Pt to demo active low range sh. flexion to 25 degrees in prep for reaching and use of arm as stabilizer   Time 8   Period Weeks   Status On-going     OT LONG TERM GOAL #3   Title Pt to demo 25% gross finger flexion/extension to grasp/release cylindrical objects   Time 8   Period Weeks   Status On-going     OT LONG TERM GOAL #4   Title Pt will use LUE as a stabilizer for ADLs with min v.c. and pain less than or equal to 3/10   Time 8   Period Weeks    Status On-going     OT LONG TERM GOAL #5   Title Pt will perfrom toilet and shower transfers with min A.   Time 8   Period Weeks   Status On-going               Plan - 07/14/16 1641    Clinical Impression Statement Pt is progressing towards goals however she is limited by cognitive deficits and poor body awareness.   Rehab Potential Fair   OT Frequency 3x / week   OT Duration 8 weeks   OT Treatment/Interventions Self-care/ADL training;Moist Heat;DME and/or AE instruction;Splinting;Patient/family education;Therapeutic exercises;Compression bandaging;Therapeutic activities;Neuromuscular education;Functional Mobility Training;Passive range of motion;Cognitive remediation/compensation;Electrical Stimulation;Manual Therapy;Dry needling;Visual/perceptual remediation/compensation   Plan monitor BP, NMR, check short term goals next week   Consulted and Agree with Plan of Care Patient   Family Member Consulted Husband - [name]      Patient will benefit from skilled therapeutic intervention in order to improve the following deficits and impairments:  Decreased activity tolerance, Decreased balance, Decreased cognition, Decreased knowledge of use of DME, Decreased coordination, Decreased endurance, Decreased range of motion, Decreased safety awareness, Decreased skin integrity, Decreased strength, Impaired perceived functional ability, Increased edema, Impaired sensation, Impaired tone, Impaired UE functional use, Impaired vision/preception, Pain, Improper body mechanics, Decreased mobility, Abnormal gait, Difficulty walking, Decreased knowledge of precautions  Visit Diagnosis: Hemiplegia and hemiparesis following cerebral infarction affecting left non-dominant side (HCC)  Muscle weakness (generalized)  Abnormal posture  Unsteadiness on feet  Other abnormalities of gait and mobility  Other lack of coordination    Problem List Patient Active Problem List   Diagnosis Date Noted  .  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 Orthostatic hypotension 04/08/2016  . Muscle cramps 04/08/2016  . 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 Cerebral thrombosis with cerebral infarction 04/06/2016  . Basilar artery stenosis   . Dysarthria 04/05/2016  . History of CVA with residual deficit 04/05/2016  . Sinus tachycardia 04/05/2016  . Acute encephalopathy 04/05/2016  . Chronic pain 04/05/2016  . Hyperlipidemia 04/05/2016  . Ischemic stroke (HCC)   . 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 TIA (transient ischemic attack) 11/07/2014  . Uncontrolled hypertension 11/07/2014  . DM II (diabetes mellitus, type II), controlled (HCC) 11/07/2014    Immaculate Crutcher 07/14/2016, 4:43  PM  [facility] [address] [place], [place], [address] Phone: [phone]   Fax:  [phone]  Name: Shelly Mcdaniel MRN: [id number] Date of Birth: 01/16/1956

## 2016-07-15 ENCOUNTER — Ambulatory Visit: Payer: BC Managed Care – PPO | Admitting: Rehabilitation

## 2016-07-15 ENCOUNTER — Encounter: Payer: Self-pay | Admitting: Rehabilitation

## 2016-07-15 ENCOUNTER — Ambulatory Visit: Payer: BC Managed Care – PPO

## 2016-07-15 ENCOUNTER — Ambulatory Visit: Payer: BC Managed Care – PPO | Admitting: Occupational Therapy

## 2016-07-15 VITALS — BP 157/107

## 2016-07-15 DIAGNOSIS — R41842 Visuospatial deficit: Secondary | ICD-10-CM

## 2016-07-15 DIAGNOSIS — R471 Dysarthria and anarthria: Secondary | ICD-10-CM

## 2016-07-15 DIAGNOSIS — M6281 Muscle weakness (generalized): Secondary | ICD-10-CM

## 2016-07-15 DIAGNOSIS — I69354 Hemiplegia and hemiparesis following cerebral infarction affecting left non-dominant side: Secondary | ICD-10-CM | POA: Diagnosis not present

## 2016-07-15 DIAGNOSIS — R2681 Unsteadiness on feet: Secondary | ICD-10-CM

## 2016-07-15 DIAGNOSIS — R2689 Other abnormalities of gait and mobility: Secondary | ICD-10-CM

## 2016-07-15 DIAGNOSIS — R293 Abnormal posture: Secondary | ICD-10-CM

## 2016-07-15 DIAGNOSIS — R41841 Cognitive communication deficit: Secondary | ICD-10-CM

## 2016-07-15 NOTE — Patient Instructions (Addendum)
  Please complete the assigned speech therapy homework and return it to your next session.  

## 2016-07-15 NOTE — Therapy (Signed)
[facility] [address] [place], [place], [address] Phone: [phone]   Fax:  [phone]  Occupational Therapy Treatment  Patient Details  Name: Shelly Mcdaniel MRN: [id number] Date of Birth: [birth year] Referring Provider: Dr. Claudette Laws  Encounter Date: 07/15/2016      OT End of Session - 07/15/16 1619    Visit Number 9   Number of Visits 25   Date for OT Re-Evaluation 07/30/16   Authorization Type BC/BS   OT Start Time 1018   OT Stop Time 1100   OT Time Calculation (min) 42 min   Activity Tolerance Patient limited by pain   Behavior During Therapy Anxious;Impulsive      Past Medical History:  Diagnosis Date  . Diabetes mellitus without complication (HCC)   . Hyperlipidemia   . Hypertension   . Stroke (HCC)   . UTI (urinary tract infection)     Past Surgical History:  Procedure Laterality Date  . BACK SURGERY    . BREAST SURGERY    . COLONOSCOPY WITH PROPOFOL N/A 12/24/2013   Procedure: COLONOSCOPY WITH PROPOFOL;  Surgeon: Charolett Bumpers, MD;  Location: [facility];  Service: Endoscopy;  Laterality: N/A;    Vitals:   07/15/16 1029  BP: (!) 157/107        Subjective Assessment - 07/15/16 1616    Subjective  Pt reports feeling better today, BP monitored    Pertinent History see Epic, Dr Roda Shutters has approved DBP less than 110 and HR 115-120 for therapy parameters.    Limitations elevated BP and orthostatic hypotension at times, monitor BP   Patient Stated Goals to be more indpendent   Currently in Pain? Yes   Pain Score 4    Pain Location Arm   Pain Orientation Left   Pain Descriptors / Indicators Aching   Pain Type Chronic pain   Pain Onset More than a month ago   Pain Frequency Intermittent   Aggravating Factors  malpositioning   Pain Relieving Factors reposistioning          Treatment: hotpack applied to left shoulder while therapist performed gentle desensitization by rubbing  hand and forearm with lotion. Gentle P/ROM to fingers, wrist, forearm, elbow and shoulder within tolerated ROM.                    OT Education - 07/15/16 1620    Education provided Yes   Education Details Gentle desensitization and P/ROM with in pain free ROM to fingers, wrist, forearm and elbow -see pt instructions   Person(s) Educated Patient;Spouse   Methods Explanation;Demonstration   Comprehension Verbalized understanding;Returned demonstration          OT Short Term Goals - 07/13/16 1025      OT SHORT TERM GOAL #1   Title (P)  Pt/family independent with initial HEP for LUE (due 06/29/16)   Time (P)  4   Period (P)  Weeks   Status (P)  On-going     OT SHORT TERM GOAL #2   Title (P)  Pt/family to verbalize understanding with proper positioning of LUE during activity and in bed to prevent pain including use of sling /splint prn   Time (P)  4   Period (P)  Weeks   Status (P)  On-going     OT SHORT TERM GOAL #3   Title (P)  Pt to perform UB dressing with min assist using hemi techniques   Time (P)  4  Period (P)  Weeks           OT Long Term Goals - 06/15/16 1250      OT LONG TERM GOAL #1   Title Independent with updated HEP prn (all LTG's due 07/30/15)   Time 8   Period Weeks   Status On-going     OT LONG TERM GOAL #2   Title Pt to demo active low range sh. flexion to 25 degrees in prep for reaching and use of arm as stabilizer   Time 8   Period Weeks   Status On-going     OT LONG TERM GOAL #3   Title Pt to demo 25% gross finger flexion/extension to grasp/release cylindrical objects   Time 8   Period Weeks   Status On-going     OT LONG TERM GOAL #4   Title Pt will use LUE as a stabilizer for ADLs with min v.c. and pain less than or equal to 3/10   Time 8   Period Weeks   Status On-going     OT LONG TERM GOAL #5   Title Pt will perfrom toilet and shower transfers with min A.   Time 8   Period Weeks   Status On-going                Plan - 07/15/16 1618    Clinical Impression Statement Pt. is progressing towards goals. Pt's BP was better today, however she continues to report hypersensitivity in LUE.   Rehab Potential Fair   OT Frequency 3x / week   OT Duration 8 weeks   OT Treatment/Interventions Self-care/ADL training;Moist Heat;DME and/or AE instruction;Splinting;Patient/family education;Therapeutic exercises;Compression bandaging;Therapeutic activities;Neuromuscular education;Functional Mobility Training;Passive range of motion;Cognitive remediation/compensation;Electrical Stimulation;Manual Therapy;Dry needling;Visual/perceptual remediation/compensation   Plan continue to monitor BP, NMR, check short term goals   Consulted and Agree with Plan of Care Patient   Family Member Consulted Husband - [name]      Patient will benefit from skilled therapeutic intervention in order to improve the following deficits and impairments:  Decreased activity tolerance, Decreased balance, Decreased cognition, Decreased knowledge of use of DME, Decreased coordination, Decreased endurance, Decreased range of motion, Decreased safety awareness, Decreased skin integrity, Decreased strength, Impaired perceived functional ability, Increased edema, Impaired sensation, Impaired tone, Impaired UE functional use, Impaired vision/preception, Pain, Improper body mechanics, Decreased mobility, Abnormal gait, Difficulty walking, Decreased knowledge of precautions  Visit Diagnosis: Hemiplegia and hemiparesis following cerebral infarction affecting left non-dominant side (HCC)  Muscle weakness (generalized)  Abnormal posture  Visuospatial deficit    Problem List Patient Active Problem List   Diagnosis Date Noted  . 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 Orthostatic hypotension 04/08/2016  . Muscle cramps 04/08/2016  . 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 Spastic hemiparesis of left  nondominant side (HCC) 04/08/2016  . Cerebral thrombosis with cerebral infarction 04/06/2016  . Basilar artery stenosis   . Dysarthria 04/05/2016  . History of CVA with residual deficit 04/05/2016  . Sinus tachycardia 04/05/2016  . Acute encephalopathy 04/05/2016  . Chronic pain 04/05/2016  . Hyperlipidemia 04/05/2016  . Ischemic stroke (HCC)   . 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 TIA (transient ischemic attack) 11/07/2014  . Uncontrolled hypertension 11/07/2014  . DM II (diabetes mellitus, type II), controlled (HCC) 11/07/2014    RINE,KATHRYN 07/15/2016, 4:21 PM Ke[place] Breath, OTR/L [phone] Phone: [phone] 4:24 PM 07/15/16 [facility] [address] [place], [place], [address] Phone: [phone]  Fax:  [phone]  Name: Wendie SimmerJody D Naclerio MRN: [id number] Date of Birth: 09-Apr-1956

## 2016-07-15 NOTE — Therapy (Signed)
[facility] [address] [place], [place], [address] Phone: [phone]   Fax:  [phone]  Speech Language Pathology Treatment  Patient Details  Name: Shelly Mcdaniel MRN: [id number] Date of Birth: 1955-11-17 Referring Provider: Alysia Penna, MD  Encounter Date: 07/15/2016      End of Session - 07/15/16 1307    Visit Number 8   Number of Visits 17   Date for SLP Re-Evaluation 08/19/16   SLP Start Time 1148   SLP Stop Time  74   SLP Time Calculation (min) 42 min   Activity Tolerance Patient tolerated treatment well      Past Medical History:  Diagnosis Date  . Diabetes mellitus without complication ([place])   . Hyperlipidemia   . Hypertension   . Stroke ([place])   . UTI (urinary tract infection)     Past Surgical History:  Procedure Laterality Date  . BACK SURGERY    . BREAST SURGERY    . COLONOSCOPY WITH PROPOFOL N/A 12/24/2013   Procedure: COLONOSCOPY WITH PROPOFOL;  Surgeon: [name], MD;  Location: [facility];  Service: Endoscopy;  Laterality: N/A;    There were no vitals filed for this visit.      Subjective Assessment - 07/15/16 1210    Subjective Pt arrives today much more alert than yesterday.   Currently in Pain? Yes   Pain Score 4    Pain Location Arm   Pain Orientation Left   Pain Descriptors / Indicators Aching   Pain Type Chronic pain   Pain Onset More than a month ago   Pain Frequency Intermittent   Aggravating Factors  malpositioning   Pain Relieving Factors repositioning   Multiple Pain Sites No               ADULT SLP TREATMENT - 07/15/16 1212      General Information   Behavior/Cognition Alert;Cooperative;Pleasant mood;Distractible;Decreased sustained attention  sleepy by end of session     Treatment Provided   Treatment provided Cognitive-Linquistic     Cognitive-Linquistic Treatment   Treatment focused on Cognition   Skilled Treatment Selective  attention targeted today, with same cards as yesterday. Pt's sustained attention was longer today due to pt not sleepy, however pt easily distracted by >5 cards as stimuli, requiring SLP to provide max cues usually, without success. SLP then reduced distracting stimuli to 5 cards to sequence/verbally sequence. When this occurred, pt req'd min-mod A occasionally. Pt performance was better with 5 cards in more familiar task (birthday party) than with more abstract task (running out of gas).     Assessment / Recommendations / Plan   Plan Continue with current plan of care     Progression Toward Goals   Progression toward goals Progressing toward goals          SLP Education - 07/14/16 1559    Education provided Yes   Education Details home tasks   Person(s) Educated Spouse;Patient   Methods Explanation;Demonstration   Comprehension Verbalized understanding;Need further instruction  pt requires further instruction          SLP Short Term Goals - 07/15/16 1308      SLP SHORT TERM GOAL #1   Title pt will demo sustained/selective attention in NOVEL therapy tasks in a min-mod noisy environment for 10 minutes, with rare min A back to task   Time 1   Period Weeks   Status Not Met     SLP SHORT TERM GOAL #2  Title pt will demo awareness of errors in novel therapy tasks, 60% of the time, over three sessions   Time 1   Period Weeks   Status Not Met     SLP SHORT TERM GOAL #3   Title pt will demo exercises for incr'd breath support with occasional min A over three sessions   Time 1   Period Weeks   Status Deferred  due to focus on cognition - will keep goal          SLP Long Term Goals - 07/15/16 1309      SLP LONG TERM GOAL #1   Title pt will demo selective attention with simple therapy tasks for 10 minutes with rare min A needed for redirection back to the task   Time 5   Period Weeks   Status Revised     SLP LONG TERM GOAL #2   Title pt will exhibit emergent awareness  in simple NOVEL cognitive linguistic tasks 80% of the time over two sessions   Time 5   Period Weeks   Status Revised     SLP LONG TERM GOAL #3   Title pt will demonstrate WNL breath support for multiple sentence responses in 5 minute conversation over three sessions    Time 5   Period Weeks   Status Revised          Plan - 07/15/16 1307    Clinical Impression Statement Pt continues to present today with deficits in cognitive-linguistics specifically attention skills and awareness. SLP's continued opinion is that these deficits affect other areas of cognitive linguistics such as problem solving, reasoning, executive function, and memory. She would beneift from skilled ST focusing on cognitive linguistics and dysarthria to decr caregiver burden and improve overall communication skills with family and community members.   Speech Therapy Frequency 2x / week   Duration --  8 weeks, or 16 therapy visits   Treatment/Interventions Patient/family education;Compensatory techniques;Internal/external aids;SLP instruction and feedback;Cognitive reorganization;Compensatory strategies;Oral motor exercises;Cueing hierarchy;Functional tasks  any or all may be used   Potential to Achieve Goals Good   Consulted and Agree with Plan of Care Patient;Family member/caregiver   Family Member Consulted husband      Patient will benefit from skilled therapeutic intervention in order to improve the following deficits and impairments:   Cognitive communication deficit  Dysarthria and anarthria    Problem List Patient Active Problem List   Diagnosis Date Noted  . 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 Orthostatic hypotension 04/08/2016  . Muscle cramps 04/08/2016  . Right pontine cerebrovascular accident ([place]) 04/08/2016  . Spastic hemiparesis of left nondominant side ([place]) 04/08/2016  . Cerebral thrombosis with cerebral infarction 04/06/2016  . Basilar artery stenosis   . Dysarthria  04/05/2016  . History of CVA with residual deficit 04/05/2016  . Sinus tachycardia 04/05/2016  . Acute encephalopathy 04/05/2016  . Chronic pain 04/05/2016  . Hyperlipidemia 04/05/2016  . Ischemic stroke ([place])   . 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 TIA (transient ischemic attack) 11/07/2014  . Uncontrolled hypertension 11/07/2014  . DM II (diabetes mellitus, type II), controlled ([place]) 11/07/2014    [place] ,[place], [place]  07/15/2016, 1:11 PM  [facility] [address] [place], [place], [address] Phone: [phone]   Fax:  [phone]   Name: Shelly Mcdaniel MRN: [id number] Date of Birth: 1956-01-18

## 2016-07-15 NOTE — Patient Instructions (Signed)
Shelly Mcdaniel's arm with lotion for desensitization Please assist her by bending and straightening fingers, bend and straighten wrist, perform supination/ pronation and elbow flexion/ extension within pain free ROM, aim for 10 reps each 1-2x day initially.

## 2016-07-15 NOTE — Therapy (Signed)
[facility] [address] [place], [place], [address] Phone: [phone]   Fax:  [phone]  Physical Therapy Treatment  Patient Details  Name: Shelly Mcdaniel MRN: [id number] Date of Birth: [birth year] Referring Provider: Rosalin Hawking, MD and Alysia Penna, MD  Encounter Date: 07/15/2016      PT End of Session - 07/15/16 1403    Visit Number 8   Number of Visits 21   Date for PT Re-Evaluation 08/01/16   Authorization Type BCBS 0 visit limit, 0 auth   PT Start Time 1103   PT Stop Time 1146   PT Time Calculation (min) 43 min   Equipment Utilized During Treatment Gait belt   Activity Tolerance Patient tolerated treatment well   Behavior During Therapy WFL for tasks assessed/performed      Past Medical History:  Diagnosis Date  . Diabetes mellitus without complication ([place])   . Hyperlipidemia   . Hypertension   . Stroke ([place])   . UTI (urinary tract infection)     Past Surgical History:  Procedure Laterality Date  . BACK SURGERY    . BREAST SURGERY    . COLONOSCOPY WITH PROPOFOL N/A 12/24/2013   Procedure: COLONOSCOPY WITH PROPOFOL;  Surgeon: [name], MD;  Location: [facility];  Service: Endoscopy;  Laterality: N/A;    There were no vitals filed for this visit.      Subjective Assessment - 07/15/16 1356    Subjective Pt reports feeling better today.    Limitations House hold activities;Walking   Patient Stated Goals "I want to work on my left extremity."    Currently in Pain? Yes   Pain Score 4    Pain Location Arm   Pain Orientation Left   Pain Descriptors / Indicators Aching   Pain Type Chronic pain   Pain Onset More than a month ago   Pain Frequency Intermittent   Aggravating Factors  malpositioning   Pain Relieving Factors repositioning                         [facility] Adult PT Treatment/Exercise - 07/15/16 0001      Ambulation/Gait   Ambulation/Gait Yes    Ambulation/Gait Assistance 4: Min assist;3: Mod assist   Ambulation/Gait Assistance Details Performed gait in straight line with L PFRW x 30' with min to mod A with use of mirror for improved visual feedback on posture.  Requires constant cues for upright posture, maintaining midline within walker, and activation on LLE.  Educated that they could begin this at home, only in straight line from w/c having chair to ambulate to for target to prevent fall.  Pt and spouse verbalized understanding.    Ambulation Distance (Feet) 30 Feet   Assistive device Left platform walker   Gait Pattern Step-through pattern;Decreased stance time - left;Decreased hip/knee flexion - left;Decreased dorsiflexion - left;Lateral trunk lean to right;Decreased arm swing - left;Decreased weight shift to left;Narrow base of support;Left circumduction;Left genu recurvatum;Ataxic   Ambulation Surface Level;Indoor     Neuro Re-ed    Neuro Re-ed Details  Performed several NMR tasks leading up to gait at end of session for improved postural conrol, weight shift, and decreasing pusher tendencies.  Performed sit<>partial stands with emphasis on increased R lateral weight shift.  Performed x 10 reps with intermittent seated rest breaks due to fatigue or LOB.  Progressed to standing with use of mirror with focus on increased L LE activation, hip  protraction and upright stance.  Pt able to gather balance at min A to min/guard level at times with use of mirror therefore progressed to advancing LLE forward/backward to increase R lateral weight shift x 10 reps, forward/backwards stepping of RLE to increase L LE activaiton in stance x 10 reps and standing sliding R UE along table top as far R as she could to increase R lateral weight shift to decrease pusher tendencies.  Tolerated well therefore progressed to gait.  BP remained stable throughout this session.  163/111 in supine, 147/106 in sitting and 156/109 following standing tasks.                   PT Education - 07/15/16 1403    Education Details beginning to walk at home in straight line only    Person(s) Educated Patient;Spouse   Methods Explanation   Comprehension Verbalized understanding          PT Short Term Goals - 07/14/16 1840      PT SHORT TERM GOAL #1   Title Pt will initiate HEP in order to indicate improved functional mobility and decreased fall risk.  (Target Date: 06/30/16)   Baseline 07/13/16: met today as HEP was issued today   Status Achieved     PT SHORT TERM GOAL #2   Title Pt will perform stand pivot transfer w/ LRAD at S level in order to increase independence at home.     Baseline 1/301/18: pt continues to need up to min assist both with and without AD (per previous notes for without AD, used AD today)   Status Not Met     PT SHORT TERM GOAL #3   Title Pt will perform bed mobility at mod I level in order to indicate improved functional independence.     Baseline 07/13/16: pt at supervision level mostly, continues to need assistance to lift left LE onto surface and cues on sequencing/technique placing her at supervision   Status Partially Met     PT SHORT TERM GOAL #4   Title Pt will ambulate 100' w/ LRAD at min A level in order to indicate improved household ambulation.     Time 4   Period Weeks   Status Not Met     PT SHORT TERM GOAL #5   Title Pt will perform PASS and improve score 3 points in order to indicate improved functional mobility.     Time 4   Period Weeks   Status On-going           PT Long Term Goals - 06/02/16 1910      PT LONG TERM GOAL #1   Title Pt/spouse will be independent with HEP in order to indicate improved functional mobility and decreased fall risk.  (Target Date: 07/28/16)   Time 8   Period Weeks   Status New     PT LONG TERM GOAL #2   Title Pt will perform stand pivot transfers w/ LRAD at mod I level in order to indicate improved functional independence.    Time 8   Period Weeks    Status New     PT LONG TERM GOAL #3   Title Pt will ambulate x 200' w/ LRAD at S level over indoor surfaces in order to increase independence with household ambulation.     Time 8   Period Weeks   Status New     PT LONG TERM GOAL #4   Title Pt will perform dynamic standing balance  with intermittent UE support x 5 mins in order to increase independence with ADLs.    Time 8   Period Weeks   Status New     PT LONG TERM GOAL #5   Title Pt will ambulate over paved outdoor surfaces w/ LRAD x 200' at S level in order to indicate initiation of community ambulation.    Time 8   Period Weeks   Status New               Plan - 07/15/16 1403    Clinical Impression Statement Skilled session focused on NMR for improved postural control, weight shift, LLE control in stance all to carryover to improved gait.  Had very productive session today and noted within session improvement.     Rehab Potential Good   Clinical Impairments Affecting Rehab Potential pt motivated by may be limited due to medical complexity and poor cognition   PT Frequency 2x / week  then 3x/wk for 4 weeks   PT Duration 4 weeks  then 3x/wk for 4 week   PT Treatment/Interventions ADLs/Self Care Home Management;Canalith Repostioning;Electrical Stimulation;DME Instruction;Stair training;Gait training;Functional mobility training;Therapeutic activities;Therapeutic exercise;Balance training;Neuromuscular re-education;Patient/family education;Orthotic Fit/Training;Passive range of motion;Energy conservation;Taping;Vestibular;Visual/perceptual remediation/compensation   PT Next Visit Plan Did MD provide any advice regarding BP, continue to check compliance with HEP, sit<>stand, transfers, standing balance to decrease pusher tendencies   Consulted and Agree with Plan of Care Patient;Family member/caregiver   Family Member Consulted Husband [name]      Patient will benefit from skilled therapeutic intervention in order to improve  the following deficits and impairments:  Abnormal gait, Decreased activity tolerance, Decreased balance, Decreased cognition, Decreased coordination, Decreased endurance, Decreased knowledge of precautions, Decreased knowledge of use of DME, Decreased mobility, Decreased safety awareness, Decreased strength, Impaired perceived functional ability, Impaired flexibility, Impaired sensation, Impaired tone, Impaired UE functional use, Improper body mechanics, Postural dysfunction  Visit Diagnosis: Hemiplegia and hemiparesis following cerebral infarction affecting left non-dominant side (HCC)  Muscle weakness (generalized)  Abnormal posture  Unsteadiness on feet  Other abnormalities of gait and mobility     Problem List Patient Active Problem List   Diagnosis Date Noted  . 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 Orthostatic hypotension 04/08/2016  . Muscle cramps 04/08/2016  . Right pontine cerebrovascular accident ([place]) 04/08/2016  . Spastic hemiparesis of left nondominant side ([place]) 04/08/2016  . Cerebral thrombosis with cerebral infarction 04/06/2016  . Basilar artery stenosis   . Dysarthria 04/05/2016  . History of CVA with residual deficit 04/05/2016  . Sinus tachycardia 04/05/2016  . Acute encephalopathy 04/05/2016  . Chronic pain 04/05/2016  . Hyperlipidemia 04/05/2016  . Ischemic stroke ([place])   . 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 TIA (transient ischemic attack) 11/07/2014  . Uncontrolled hypertension 11/07/2014  . DM II (diabetes mellitus, type II), controlled ([place]) 11/07/2014   Cameron Sprang, PT, MPT [facility] [address] [place], [place], [address] Phone: [phone]   Fax:  [phone] 07/15/16, 2:06 PM  Name: Shelly Mcdaniel MRN: [id number] Date of Birth: 02-12-1956

## 2016-07-16 NOTE — Telephone Encounter (Signed)
I have contacted pt daughter Ambrose MantleHenley and she told me the same thing about pt recent event during PT session and her limitation due to orthostatic hypotension. I think I need to over book her to be seen next week. Thank you for letting me know.   Katrina, could you please over book her next Wednesday with me at noon 12pm? Thanks.   Marvel PlanJindong Nitesh Pitstick, MD PhD Stroke Neurology 07/16/2016 11:39 AM

## 2016-07-18 ENCOUNTER — Ambulatory Visit: Payer: BC Managed Care – PPO | Admitting: Rehabilitation

## 2016-07-18 ENCOUNTER — Encounter: Payer: Self-pay | Admitting: Rehabilitation

## 2016-07-18 DIAGNOSIS — R293 Abnormal posture: Secondary | ICD-10-CM

## 2016-07-18 DIAGNOSIS — R2689 Other abnormalities of gait and mobility: Secondary | ICD-10-CM

## 2016-07-18 DIAGNOSIS — R2681 Unsteadiness on feet: Secondary | ICD-10-CM

## 2016-07-18 DIAGNOSIS — I69354 Hemiplegia and hemiparesis following cerebral infarction affecting left non-dominant side: Secondary | ICD-10-CM

## 2016-07-18 DIAGNOSIS — M6281 Muscle weakness (generalized): Secondary | ICD-10-CM

## 2016-07-18 NOTE — Telephone Encounter (Signed)
Placed patient on schedule in an available 11:30 slot on Wednesday - arrival time 11:00am.  Attempted to reach by phone.  Left message, on her husband's number (ok per DPR), with the appt information.  I ask him to call back and confirm.

## 2016-07-18 NOTE — Telephone Encounter (Signed)
Spoke to pt's husband - the appt time works for them.

## 2016-07-18 NOTE — Therapy (Signed)
[facility] [address] [place], [place], [address] Phone: [phone]   Fax:  [phone]  Physical Therapy Treatment  Patient Details  Name: Shelly Mcdaniel MRN: [id number] Date of Birth: 1956/04/22 Referring Provider: Rosalin Hawking, MD and Alysia Penna, MD  Encounter Date: 07/18/2016      PT End of Session - 07/18/16 2011    Visit Number 9   Number of Visits 21   Date for PT Re-Evaluation 08/01/16   Authorization Type BCBS 0 visit limit, 0 auth   PT Start Time 1102   PT Stop Time 1146   PT Time Calculation (min) 44 min   Equipment Utilized During Treatment Gait belt   Activity Tolerance Patient tolerated treatment well   Behavior During Therapy WFL for tasks assessed/performed      Past Medical History:  Diagnosis Date  . Diabetes mellitus without complication ([place])   . Hyperlipidemia   . Hypertension   . Stroke ([place])   . UTI (urinary tract infection)     Past Surgical History:  Procedure Laterality Date  . BACK SURGERY    . BREAST SURGERY    . COLONOSCOPY WITH PROPOFOL N/A 12/24/2013   Procedure: COLONOSCOPY WITH PROPOFOL;  Surgeon: [name], MD;  Location: [facility];  Service: Endoscopy;  Laterality: N/A;    There were no vitals filed for this visit.      Subjective Assessment - 07/18/16 1105    Subjective Pt reports feeling better today.    Patient is accompained by: Family member   Limitations House hold activities;Walking   Patient Stated Goals "I want to work on my left extremity."    Currently in Pain? Yes   Pain Score 2    Pain Location Arm   Pain Orientation Left   Pain Descriptors / Indicators Aching   Pain Type Chronic pain   Pain Onset More than a month ago   Pain Frequency Intermittent   Aggravating Factors  malpositioning   Pain Relieving Factors repositioning            NMR:  Worked in tall kneeling with kaye bench placed in front of pt for intermittent UE  support in order to address postural control, improved L proximal strength and activation/forced use, and improved weight shift to the R to decrease pusher tendencies.  Worked on transitions from tall kneeling to partial side sitting to the L and back to midline without UE support to increase LLE and trunk activation x 10 reps, tall kneeling to partial side sitting to the R to decrease pusher tendencies with verbal and visual cues for improved weight shift.  Progressed to upward reaching forward/upward and working slightly left of midline to increase LLE control and improved L hip protraction.  Once in stance on LLE properly, had pt work on side stepping RLE out to side and back x 10 reps.  Pt requires several rest breaks due to fatigue, but otherwise tolerated well.  Ended session with gait x 30' with +2A (without AD) in order to better facilitate upright posture, improved lateral/forward weight shift, assist at L knee to prevent buckle and at L pelvis for improved protraction during L stance.                      PT Education - 07/18/16 2011    Education provided Yes   Education Details increasing upright activity at home   Person(s) Educated Patient;Spouse   Methods Explanation  Comprehension Verbalized understanding          PT Short Term Goals - 07/14/16 1840      PT SHORT TERM GOAL #1   Title Pt will initiate HEP in order to indicate improved functional mobility and decreased fall risk.  (Target Date: 06/30/16)   Baseline 07/13/16: met today as HEP was issued today   Status Achieved     PT SHORT TERM GOAL #2   Title Pt will perform stand pivot transfer w/ LRAD at S level in order to increase independence at home.     Baseline 1/301/18: pt continues to need up to min assist both with and without AD (per previous notes for without AD, used AD today)   Status Not Met     PT SHORT TERM GOAL #3   Title Pt will perform bed mobility at mod I level in order to indicate  improved functional independence.     Baseline 07/13/16: pt at supervision level mostly, continues to need assistance to lift left LE onto surface and cues on sequencing/technique placing her at supervision   Status Partially Met     PT SHORT TERM GOAL #4   Title Pt will ambulate 100' w/ LRAD at min A level in order to indicate improved household ambulation.     Time 4   Period Weeks   Status Not Met     PT SHORT TERM GOAL #5   Title Pt will perform PASS and improve score 3 points in order to indicate improved functional mobility.     Time 4   Period Weeks   Status On-going           PT Long Term Goals - 06/02/16 1910      PT LONG TERM GOAL #1   Title Pt/spouse will be independent with HEP in order to indicate improved functional mobility and decreased fall risk.  (Target Date: 07/28/16)   Time 8   Period Weeks   Status New     PT LONG TERM GOAL #2   Title Pt will perform stand pivot transfers w/ LRAD at mod I level in order to indicate improved functional independence.    Time 8   Period Weeks   Status New     PT LONG TERM GOAL #3   Title Pt will ambulate x 200' w/ LRAD at S level over indoor surfaces in order to increase independence with household ambulation.     Time 8   Period Weeks   Status New     PT LONG TERM GOAL #4   Title Pt will perform dynamic standing balance with intermittent UE support x 5 mins in order to increase independence with ADLs.    Time 8   Period Weeks   Status New     PT LONG TERM GOAL #5   Title Pt will ambulate over paved outdoor surfaces w/ LRAD x 200' at S level in order to indicate initiation of community ambulation.    Time 8   Period Weeks   Status New               Plan - 07/18/16 2012    Clinical Impression Statement Skilled session focused on NMR in forced WB position for improved LLE control/activation in stance, improved postural control and decreased pusher tendencies as well as gait without device to address these  deficits as well.  tolerated well with BP elevated but within parameters.     Rehab Potential Good  Clinical Impairments Affecting Rehab Potential pt motivated by may be limited due to medical complexity and poor cognition   PT Frequency 2x / week  then 3x/wk for 4 weeks   PT Duration 4 weeks  then 3x/wk for 4 week   PT Treatment/Interventions ADLs/Self Care Home Management;Canalith Repostioning;Electrical Stimulation;DME Instruction;Stair training;Gait training;Functional mobility training;Therapeutic activities;Therapeutic exercise;Balance training;Neuromuscular re-education;Patient/family education;Orthotic Fit/Training;Passive range of motion;Energy conservation;Taping;Vestibular;Visual/perceptual remediation/compensation   PT Next Visit Plan Did MD provide any advice regarding BP, continue to check compliance with HEP, sit<>stand, transfers, standing balance to decrease pusher tendencies   Consulted and Agree with Plan of Care Patient;Family member/caregiver   Family Member Consulted Husband [name]      Patient will benefit from skilled therapeutic intervention in order to improve the following deficits and impairments:  Abnormal gait, Decreased activity tolerance, Decreased balance, Decreased cognition, Decreased coordination, Decreased endurance, Decreased knowledge of precautions, Decreased knowledge of use of DME, Decreased mobility, Decreased safety awareness, Decreased strength, Impaired perceived functional ability, Impaired flexibility, Impaired sensation, Impaired tone, Impaired UE functional use, Improper body mechanics, Postural dysfunction  Visit Diagnosis: Hemiplegia and hemiparesis following cerebral infarction affecting left non-dominant side (HCC)  Muscle weakness (generalized)  Abnormal posture  Unsteadiness on feet  Other abnormalities of gait and mobility     Problem List Patient Active Problem List   Diagnosis Date Noted  . Adhesive capsulitis of left shoulder  06/16/2016  . Orthostatic hypotension 04/08/2016  . Muscle cramps 04/08/2016  . Right pontine cerebrovascular accident ([place]) 04/08/2016  . Spastic hemiparesis of left nondominant side ([place]) 04/08/2016  . Cerebral thrombosis with cerebral infarction 04/06/2016  . Basilar artery stenosis   . Dysarthria 04/05/2016  . History of CVA with residual deficit 04/05/2016  . Sinus tachycardia 04/05/2016  . Acute encephalopathy 04/05/2016  . Chronic pain 04/05/2016  . Hyperlipidemia 04/05/2016  . Ischemic stroke ([place])   . 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 TIA (transient ischemic attack) 11/07/2014  . Uncontrolled hypertension 11/07/2014  . DM II (diabetes mellitus, type II), controlled ([place]) 11/07/2014   Cameron Sprang, PT, MPT [facility] [address] [place], [place], [address] Phone: [phone]   Fax:  [phone] 07/18/16, 8:25 PM  Name: Shelly Mcdaniel MRN: [id number] Date of Birth: 05-23-1956

## 2016-07-19 ENCOUNTER — Ambulatory Visit: Payer: BC Managed Care – PPO

## 2016-07-19 ENCOUNTER — Encounter: Payer: Self-pay | Admitting: Occupational Therapy

## 2016-07-19 ENCOUNTER — Ambulatory Visit: Payer: BC Managed Care – PPO | Admitting: Occupational Therapy

## 2016-07-19 VITALS — BP 155/106

## 2016-07-19 DIAGNOSIS — R41842 Visuospatial deficit: Secondary | ICD-10-CM

## 2016-07-19 DIAGNOSIS — I69354 Hemiplegia and hemiparesis following cerebral infarction affecting left non-dominant side: Secondary | ICD-10-CM | POA: Diagnosis not present

## 2016-07-19 DIAGNOSIS — R471 Dysarthria and anarthria: Secondary | ICD-10-CM

## 2016-07-19 DIAGNOSIS — R41841 Cognitive communication deficit: Secondary | ICD-10-CM

## 2016-07-19 DIAGNOSIS — R293 Abnormal posture: Secondary | ICD-10-CM

## 2016-07-19 DIAGNOSIS — M6281 Muscle weakness (generalized): Secondary | ICD-10-CM

## 2016-07-19 NOTE — Therapy (Signed)
[facility] [address] [place], [place], [address] Phone: [phone]   Fax:  [phone]  Occupational Therapy Treatment  Patient Details  Name: Shelly Mcdaniel MRN: [id number] Date of Birth: 1956-01-07 Referring Provider: Dr. Claudette Laws  Encounter Date: 07/19/2016      OT End of Session - 07/19/16 1432    Visit Number 10   Number of Visits 25   Date for OT Re-Evaluation 07/30/16   Authorization Type BC/BS   OT Start Time 1324  pt late   OT Stop Time 1400   OT Time Calculation (min) 36 min   Activity Tolerance Patient tolerated treatment well   Behavior During Therapy [facility] for tasks assessed/performed      Past Medical History:  Diagnosis Date  . Diabetes mellitus without complication (HCC)   . Hyperlipidemia   . Hypertension   . Stroke (HCC)   . UTI (urinary tract infection)     Past Surgical History:  Procedure Laterality Date  . BACK SURGERY    . BREAST SURGERY    . COLONOSCOPY WITH PROPOFOL N/A 12/24/2013   Procedure: COLONOSCOPY WITH PROPOFOL;  Surgeon: Charolett Bumpers, MD;  Location: [facility];  Service: Endoscopy;  Laterality: N/A;    Vitals:   07/19/16 1325 07/19/16 1359  BP: (!) 162/100 (!) 155/106        Subjective Assessment - 07/19/16 1429    Subjective  Pt reports feeling better today, BP monitored    Pertinent History see Epic, Dr Roda Shutters has approved DBP less than 110 and HR 115-120 for therapy parameters.    Limitations elevated BP and orthostatic hypotension at times, monitor BP   Patient Stated Goals to be more indpendent   Currently in Pain? Yes   Pain Score 4    Pain Location Arm   Pain Orientation Left   Pain Descriptors / Indicators Aching   Pain Type Chronic pain   Pain Onset More than a month ago   Pain Frequency Intermittent   Aggravating Factors  malpositioning   Pain Relieving Factors repositioning   Multiple Pain Sites No             Treatment: Pt  arrived late. Therapist monitored BP. See vitals Seated on mat, gentle massage/ desensitization to LUE with pt assisting. Gentle P/ROM to digits and forearm followed by body on arm movemtns with pt weightbearing through left elbow over bench with lateral weight shifts and trunk rotation while reeaching with RUE, min v.c / facilitation. Pt fatigued quickly. Pt transferred back to w/c min A, mod v.c. Therapistt checked progress towards goals. Pt's progress has been limited by medical issues, including orthostatic and elevated BP.                   OT Short Term Goals - 07/19/16 1400      OT SHORT TERM GOAL #1   Title Pt/family independent with initial HEP for LUE (due 06/29/16)   Time 4   Period Weeks   Status Achieved     OT SHORT TERM GOAL #2   Title Pt/family to verbalize understanding with proper positioning of LUE during activity and in bed to prevent pain including use of sling /splint prn   Time 4   Period Weeks   Status On-going  needs reinforcement     OT SHORT TERM GOAL #3   Title Pt to perform UB dressing with min assist using hemi techniques   Time 4  Period Weeks   Status On-going  mod/ max A     OT SHORT TERM GOAL #4   Title Pt will donn pants with mod A.   Time 4   Period Weeks   Status On-going  max A     OT SHORT TERM GOAL #5   Title Pt will use LUE as a stabilizer with min A 25 % of the time for ADLS/ functional activity   Time 4   Period Weeks   Status On-going  not consistently     OT SHORT TERM GOAL #6   Title Pt will perform bathing with min A for UB and mod A for LB   Time 4   Period Weeks   Status On-going  not fully addressed           OT Long Term Goals - 06/15/16 1250      OT LONG TERM GOAL #1   Title Independent with updated HEP prn (all LTG's due 07/30/15)   Time 8   Period Weeks   Status On-going     OT LONG TERM GOAL #2   Title Pt to demo active low range sh. flexion to 25 degrees in prep for reaching and use of  arm as stabilizer   Time 8   Period Weeks   Status On-going     OT LONG TERM GOAL #3   Title Pt to demo 25% gross finger flexion/extension to grasp/release cylindrical objects   Time 8   Period Weeks   Status On-going     OT LONG TERM GOAL #4   Title Pt will use LUE as a stabilizer for ADLs with min v.c. and pain less than or equal to 3/10   Time 8   Period Weeks   Status On-going     OT LONG TERM GOAL #5   Title Pt will perfrom toilet and shower transfers with min A.   Time 8   Period Weeks   Status On-going               Plan - 07/19/16 1430    Clinical Impression Statement Pt is progressing towards goals slowly limited by BP issues and LUE hypersensitivity.   Rehab Potential Fair   OT Frequency 3x / week   OT Duration 8 weeks   OT Treatment/Interventions Self-care/ADL training;Moist Heat;DME and/or AE instruction;Splinting;Patient/family education;Therapeutic exercises;Compression bandaging;Therapeutic activities;Neuromuscular education;Functional Mobility Training;Passive range of motion;Cognitive remediation/compensation;Electrical Stimulation;Manual Therapy;Dry needling;Visual/perceptual remediation/compensation   Plan monitor BP, NMR for LUE/ trunk   Consulted and Agree with Plan of Care Patient;Family member/caregiver   Family Member Consulted Husband - [name]      Patient will benefit from skilled therapeutic intervention in order to improve the following deficits and impairments:  Decreased activity tolerance, Decreased balance, Decreased cognition, Decreased knowledge of use of DME, Decreased coordination, Decreased endurance, Decreased range of motion, Decreased safety awareness, Decreased skin integrity, Decreased strength, Impaired perceived functional ability, Increased edema, Impaired sensation, Impaired tone, Impaired UE functional use, Impaired vision/preception, Pain, Improper body mechanics, Decreased mobility, Abnormal gait, Difficulty walking, Decreased  knowledge of precautions  Visit Diagnosis: Hemiplegia and hemiparesis following cerebral infarction affecting left non-dominant side (HCC)  Muscle weakness (generalized)  Abnormal posture  Visuospatial deficit    Problem List Patient Active Problem List   Diagnosis Date Noted  . 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 Orthostatic hypotension 04/08/2016  . Muscle cramps 04/08/2016  . 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 Spastic hemiparesis  of left nondominant side (HCC) 04/08/2016  . Cerebral thrombosis with cerebral infarction 04/06/2016  . Basilar artery stenosis   . Dysarthria 04/05/2016  . History of CVA with residual deficit 04/05/2016  . Sinus tachycardia 04/05/2016  . Acute encephalopathy 04/05/2016  . Chronic pain 04/05/2016  . Hyperlipidemia 04/05/2016  . Ischemic stroke (HCC)   . 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 TIA (transient ischemic attack) 11/07/2014  . Uncontrolled hypertension 11/07/2014  . DM II (diabetes mellitus, type II), controlled (HCC) 11/07/2014    Maryori Weide 07/19/2016, 2:37 PM  [facility] [address] [place], [place], [address] Phone: [phone]   Fax:  [phone]  Name: Wendie SimmerJody D Kempton MRN: [id number] Date of Birth: [birth year]

## 2016-07-19 NOTE — Patient Instructions (Signed)
  Please complete the assigned speech therapy homework and return it to your next session.  

## 2016-07-19 NOTE — Therapy (Signed)
[facility] [address] [place], [place], [address] Phone: [phone]   Fax:  [phone]  Speech Language Pathology Treatment  Patient Details  Name: Shelly Mcdaniel MRN: [id number] Date of Birth: 12-Nov-1955 Referring Provider: Alysia Penna, MD  Encounter Date: 07/19/2016      End of Session - 07/19/16 1449    Visit Number 9   Number of Visits 17   Date for SLP Re-Evaluation 08/19/16   SLP Start Time 6314   SLP Stop Time  9702   SLP Time Calculation (min) 42 min      Past Medical History:  Diagnosis Date  . Diabetes mellitus without complication ([place])   . Hyperlipidemia   . Hypertension   . Stroke ([place])   . UTI (urinary tract infection)     Past Surgical History:  Procedure Laterality Date  . BACK SURGERY    . BREAST SURGERY    . COLONOSCOPY WITH PROPOFOL N/A 12/24/2013   Procedure: COLONOSCOPY WITH PROPOFOL;  Surgeon: [name], MD;  Location: [facility];  Service: Endoscopy;  Laterality: N/A;    There were no vitals filed for this visit.      Subjective Assessment - 07/19/16 1406    Subjective "We ate dinner out last night - at [employer]. I had pot roast, green beans, ...and mashed potatoes."               ADULT SLP TREATMENT - 07/19/16 1407      General Information   Behavior/Cognition Cooperative;Pleasant mood;Distractible;Decreased sustained attention     Treatment Provided   Treatment provided Cognitive-Linquistic     Pain Assessment   Pain Assessment 0-10   Pain Score 2    Pain Location lt arm   Pain Descriptors / Indicators Sore;Aching   Pain Intervention(s) Monitored during session     Cognitive-Linquistic Treatment   Treatment focused on Cognition   Skilled Treatment Sustained/selective attention targeted again today.  Pt req'd initial mod cues consistently for turn taking in a linguistic game, faded to mod cues occasionally. Pt named items in simple  categories with extended time and rare min A back to task. Pt self-redirected back to task once. "It's so weird to have a thought one second and in the next have it leave," pt stated. Pt with weak voice consistently.      Assessment / Recommendations / Plan   Plan Continue with current plan of care     Progression Toward Goals   Progression toward goals Progressing toward goals            SLP Short Term Goals - 07/19/16 1449      SLP SHORT TERM GOAL #1   Title pt will demo sustained/selective attention in NOVEL therapy tasks in a min-mod noisy environment for 10 minutes, with rare min A back to task   Time 1   Period Weeks   Status Not Met     SLP SHORT TERM GOAL #2   Title pt will demo awareness of errors in novel therapy tasks, 60% of the time, over three sessions   Time 1   Period Weeks   Status Not Met     SLP SHORT TERM GOAL #3   Title pt will demo exercises for incr'd breath support with occasional min A over three sessions   Time 1   Period Weeks   Status Deferred  due to focus on cognition - will keep goal  SLP Long Term Goals - 07/19/16 1449      SLP LONG TERM GOAL #1   Title pt will demo selective attention with simple therapy tasks for 10 minutes with rare min A needed for redirection back to the task   Time 4   Period Weeks   Status Revised     SLP LONG TERM GOAL #2   Title pt will exhibit emergent awareness in simple NOVEL cognitive linguistic tasks 80% of the time over two sessions   Time 4   Period Weeks   Status Revised     SLP LONG TERM GOAL #3   Title pt will demonstrate WNL breath support for multiple sentence responses in 5 minute conversation over three sessions    Time 4   Period Weeks   Status Revised          Plan - 07/19/16 1449    Clinical Impression Statement Pt continues to present today with deficits in cognitive-linguistics specifically attention skills and awareness. SLP's continued opinion is that these deficits  affect other areas of cognitive linguistics such as problem solving, reasoning, executive function, and memory. She would beneift from skilled ST focusing on cognitive linguistics and dysarthria to decr caregiver burden and improve overall communication skills with family and community members.   Speech Therapy Frequency 2x / week   Duration --  8 weeks, or 16 therapy visits   Treatment/Interventions Patient/family education;Compensatory techniques;Internal/external aids;SLP instruction and feedback;Cognitive reorganization;Compensatory strategies;Oral motor exercises;Cueing hierarchy;Functional tasks  any or all may be used   Potential to Achieve Goals Good   Consulted and Agree with Plan of Care Patient;Family member/caregiver   Family Member Consulted husband      Patient will benefit from skilled therapeutic intervention in order to improve the following deficits and impairments:   Cognitive communication deficit  Dysarthria and anarthria    Problem List Patient Active Problem List   Diagnosis Date Noted  . 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 Orthostatic hypotension 04/08/2016  . Muscle cramps 04/08/2016  . Right pontine cerebrovascular accident ([place]) 04/08/2016  . Spastic hemiparesis of left nondominant side ([place]) 04/08/2016  . Cerebral thrombosis with cerebral infarction 04/06/2016  . Basilar artery stenosis   . Dysarthria 04/05/2016  . History of CVA with residual deficit 04/05/2016  . Sinus tachycardia 04/05/2016  . Acute encephalopathy 04/05/2016  . Chronic pain 04/05/2016  . Hyperlipidemia 04/05/2016  . Ischemic stroke ([place])   . 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 TIA (transient ischemic attack) 11/07/2014  . Uncontrolled hypertension 11/07/2014  . DM II (diabetes mellitus, type II), controlled ([place]) 11/07/2014    [place] ,[place], [place]  07/19/2016, 2:50 PM  [facility] [address] [place], [place], [address] Phone: [phone]   Fax:  [phone]   Name: Shelly Mcdaniel MRN: [id number] Date of Birth: 1955/07/07

## 2016-07-20 ENCOUNTER — Encounter: Payer: Self-pay | Admitting: Neurology

## 2016-07-20 ENCOUNTER — Ambulatory Visit (INDEPENDENT_AMBULATORY_CARE_PROVIDER_SITE_OTHER): Payer: BC Managed Care – PPO | Admitting: Neurology

## 2016-07-20 VITALS — BP 146/93 | HR 104 | Ht 68.5 in | Wt 129.0 lb

## 2016-07-20 DIAGNOSIS — I6330 Cerebral infarction due to thrombosis of unspecified cerebral artery: Secondary | ICD-10-CM | POA: Diagnosis not present

## 2016-07-20 DIAGNOSIS — I651 Occlusion and stenosis of basilar artery: Secondary | ICD-10-CM | POA: Diagnosis not present

## 2016-07-20 DIAGNOSIS — E1121 Type 2 diabetes mellitus with diabetic nephropathy: Secondary | ICD-10-CM

## 2016-07-20 DIAGNOSIS — E7849 Other hyperlipidemia: Secondary | ICD-10-CM

## 2016-07-20 DIAGNOSIS — I951 Orthostatic hypotension: Secondary | ICD-10-CM | POA: Diagnosis not present

## 2016-07-20 DIAGNOSIS — I6930 Unspecified sequelae of cerebral infarction: Secondary | ICD-10-CM

## 2016-07-20 DIAGNOSIS — E784 Other hyperlipidemia: Secondary | ICD-10-CM | POA: Diagnosis not present

## 2016-07-20 NOTE — Patient Instructions (Addendum)
-   continue ASA 81mg  an plavix for stroke prevention - continue zocor for stroke prevention - as I understand, PT/OT more concerning about orthostatic hypertension. Due to orhtostatic effect of beta blocker, we may consider spread metoprolol to 12.5mg  bid.  - may also consider lisinopril 2.5mg  to 5mg  at night to help for BP and cardiac/renal protection if desire - if symptoms recurrent or orthostatic hypotension difficulty to manage, we may think about basilar artery stent. - Follow up with your primary care physician for stroke risk factor modification. Recommend maintain blood pressure goal 130-150/70-90, diabetes with hemoglobin A1c goal below 7.0% and lipids with LDL cholesterol goal below 70 mg/dL.  - check BP and orthostatic vitals at home for monitoring and record - keep hydrated and avoid dehydration  - continue outpt PT/OT - follow up on 08/31/16 as scheduled repeat CTA to monitoring

## 2016-07-20 NOTE — Progress Notes (Signed)
STROKE NEUROLOGY FOLLOW UP NOTE  NAME: Shelly Mcdaniel DOB: 1955/06/30  REASON FOR VISIT: stroke follow up HISTORY FROM: husband and pt and chart  Today we had the pleasure of seeing Shelly Mcdaniel in follow-up at our Neurology Clinic. Pt was accompanied by husband.   History Summary [name] is a 61 y.o. 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 she had right pontine stroke in 01/2016 and was treated in [facility]. TEE and CUS unremarkable and she was discharged with ASA and zocor. She continued to have left hemiparesis. After admission, stroke work up showed again right pontine and b/l cerebellar peduncle infarcts. 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 EEG generalized slowing and no seizure. LDL 110, A1C 6.7. She was put on DAPT and crestor. She was also found to have orthostatic hypotension. Put on TED hose and BP goal 130-150. She was later discharged to CIR.    05/23/16 follow up - the patient has been doing the same. She is now home. She was initially improving on the left hemiparesis but recently developed continued UTI even with antibiotics. As per son, all her improvement has been disappeared due to UTI. Her BP at home lying about 150-170, sitting 130s and standing would drop to 100s. Today in clinic BP 123/85  Interval History During the interval time, pt has been doing better. She had at least UTI x 2, on cipro and recently referred to urology and pending further work up with CT or ultrasound. Fortunately, no sepsis either time. She continued to work with outpt PT/OT and her left LE much improved on strength, however, LUE still flaccid. Working with PT/OT still has intermittent orthostatic hypotension which sometimes aborted her rehab session. However, as per husband, pt BP at home sitting 130/90,  lying 150s and standing lower than 130 but not remeber exact number. She is on metoprolol 25mg  daily. As per husband, daughter who is anesthesiologist in [employer] would like pt to have low dose lisinopril for her cardiac and renal protection. BP today in clinic sitting 146/93.   REVIEW OF SYSTEMS: Full 14 system review of systems performed and notable only for those listed below and in HPI above, all others are negative:  Constitutional:  fatigue Cardiovascular:  Ear/Nose/Throat:  Trouble swallowing Skin:  Eyes:   Respiratory:   Gastroitestinal:  Nausea  Genitourinary: incontinence of bladder Hematology/Lymphatic:   Endocrine: cold intolerance Musculoskeletal:  Back pain, aching muscles, walking difficulty Allergy/Immunology:  Frequent infections Neurological:   Dizziness, weakness Psychiatric:  Sleep:   The following represents the patient's updated allergies and side effects list: Allergies  Allergen Reactions  . Bee Venom Anaphylaxis  . Codeine Itching  . Lipitor [Atorvastatin] Other (See Comments)    Cramping   . Septra [Sulfamethoxazole-Trimethoprim]     Mother died from Stevens-Johnson syndrome and sister had anaphylaxis from Septra.Physician told her never to take this medication.    The neurologically relevant items on the patient's problem list were reviewed on today's visit.  Neurologic Examination  A problem focused neurological exam (12 or more points of the single system neurologic examination, vital signs counts as 1 point, cranial nerves count for 8 points) was performed.  Blood pressure (!) 146/93, pulse (!) 104, height 5' 8.5" (1.74 m), weight 129 lb (58.5 kg).  General - Well nourished, well developed, in no apparent distress.  Ophthalmologic - Fundi not visualized due to eye movement.  Cardiovascular - Regular rate and rhythm.  Mental Status -  Level of arousal and orientation to time, place and person were intact. Language exam showed mild  wording finding difficulty and slow speech, mild dysarthria, but intact naming, repetition, comprehension.  Cranial Nerves II - XII - II - Visual field intact OU. III, IV, VI - Extraocular movements intact. V - Facial sensation intact bilaterally. VII - left facial droop. VIII - Hearing & vestibular intact bilaterally. X - Palate elevates symmetrically, mild dysarthria. XI - Chin turning & shoulder shrug intact bilaterally. XII - Tongue protrusion intact.  Motor Strength - The patient's strength was 5/5 RUE and RLE, but 0/5 LUE and 3+/5 LLE proximal but 0/5 DF and PF. Bulk was normal and fasciculations were absent.   Motor Tone - Muscle tone was assessed at the neck and appendages and was increased LUE.  Reflexes - The patient's reflexes were 1+ in all extremities and she had no pathological reflexes.  Sensory - Light touch, temperature/pinprick were assessed and were symmetrical.    Coordination - The patient had normal movements in the right hand with no ataxia or dysmetria.  Tremor was absent.  Gait and Station - in wheelchair, not tested.    Data reviewed: I personally reviewed the images and agree with the radiology interpretations.  Dg Chest 2 View 04/05/2016 No radiographic evidence of acute cardiopulmonary disease.   Ct Head Wo Contrast 04/05/2016 1. No acute intracranial findings.  2. 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   Mr Brain 28 Contrast Mr Maxine Glenn Head Wo Contrast 04/05/2016 1. Foci of diffusion restriction within the right hemipons and abnormal signal in the middle cerebellar peduncles probably represent areas of acute/early subacute ischemia. No hemorrhage identified.  2. 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  3. Otherwise the circle of Willis is patent with atherosclerotic changes and short segments of mild stenosis in the anterior and posterior circulation without large vessel occlusion or aneurysm.  4.  Paranasal sinus disease predominantly in anterior ethmoid and right frontal sinuses.  5. Stable moderate chronic microvascular ischemic changes of the brain parenchyma.   EEG This is an abnormal EEG due to the presence of mild diffuse generalized slowing with greater intermixed bifrontal slowing. This pattern is nonspecific and consistent with a global encephalopathic process, nonspecific as to etiology. There is no evidence of seizure on the study.  CUS 01/17/2016 - bilateral plaques, but no hemodynamically significant stenosis  TTE - 01/17/16 - mild diastolic dysfunction with borderline left ventricular hypertrophy with a trace amount of mital regurgitation, trace amount of aortic regurgitation, trace amount of tricuspid regurgitation.  MRI brain 01/17/16 - atrophy and chronic micro-vascular ischemic changes. There is restricted diffusion on the right side of the pons consistent with an acute infarct.   MRI 05/13/16 1. Limited motion degraded brain MRI consisting of diffusion and FLAIR sequences. 2. No new abnormality compared to 04/05/2016. 3. Diffusion hyperintensity from the recent right pontine infarct has resolved as expected, but symmetric hyperintensity persists in the middle cerebellar peduncles. Favor acute Wallerian degeneration which can have this pattern with pontine infarct. Toxic/metabolic derangement, usually described with liver disease or hypoglycemia, or hypertensive complication are alternate considerations.  Component     Latest Ref Rng & Units 04/05/2016 04/07/2016  Cholesterol     0 - 200 mg/dL  161  Triglycerides     <  150 mg/dL  782  HDL Cholesterol     >40 mg/dL  29 (L)  Total CHOL/HDL Ratio     RATIO  5.7  VLDL     0 - 40 mg/dL  25  LDL (calc)     0 - 99 mg/dL  956 (H)  Hemoglobin O1H     4.8 - 5.6 % 6.7 (H)   Mean Plasma Glucose     mg/dL 086   Ammonia     9 - 35 umol/L 14   Vitamin B12     180 - 914 pg/mL  320  Folate     >5.9 ng/mL  21.7     Assessment: As you may recall, she is a 61 y.o. 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 TTE and CUS in 01/2016 unremarkable, EEG generalized slowing and no seizure. LDL 110, A1C 6.7. She was put on DAPT and crestor. She was also found to have orthostatic hypotension. Put on TED hose and BP goal 130-150. She was later discharged to CIR and then home. She was initially improving on the left hemiparesis but recently worsened after developed continued UTI even with antibiotics. Still has orthostatic hypotension at home and pt does not like compression socks. Over time, her condition stabilized, working with PT/OT and LLE strength much improved. Still has frequent UTI and following with urology. Orthostatic hypotension improved some. On metoprolol 25mg  daily, recommend 12.5mg  bid and add lisinopril 2.5-5mg  at high as per daughter's wishes. She is following with her PCP today and will discuss above recommendations. Not tolerating crestor and now on zocor.  Plan:  - continue ASA 81mg  an plavix and zocor for stroke prevention - Due to orhtostatic effect of beta blocker, we may consider spread metoprolol to 12.5mg  bid. May also consider lisinopril 2.5mg  to 5mg  at night as daughter's wishes. Pt is following with her PCP today and will discuss above recommendations. - if symptoms recurrent or orthostatic hypotension difficulty to manage, we may think about basilar artery stent. - Follow up with your primary care physician for stroke risk factor modification. Recommend maintain blood pressure goal 130-150/70-90, diabetes with hemoglobin A1c goal below 7.0% and lipids with LDL cholesterol goal below 70 mg/dL.  - check BP and orthostatic vitals at home for monitoring and  record - keep hydrated and avoid dehydration  - continue outpt PT/OT - follow up on 08/31/16 as scheduled repeat CTA to monitoring   I spent more than 25 minutes of face to face time with the patient. Greater than 50% of time was spent in counseling and coordination of care. We discussed BP management, medication recommendations and potential BA stent if symptoms recurrent.   No orders of the defined types were placed in this encounter.   No orders of the defined types were placed in this encounter.   Patient Instructions  - continue ASA 81mg  an plavix for stroke prevention - continue zocor for stroke prevention - as I understand, PT/OT more concerning about orthostatic hypertension. Due to orhtostatic effect of beta blocker, we may consider spread metoprolol to 12.5mg  bid.  - may also consider lisinopril 2.5mg  to 5mg  at night to help for BP and cardiac/renal protection if desire - if symptoms recurrent or orthostatic hypotension difficulty  to manage, we may think about basilar artery stent. - Follow up with your primary care physician for stroke risk factor modification. Recommend maintain blood pressure goal 130-150/70-90, diabetes with hemoglobin A1c goal below 7.0% and lipids with LDL cholesterol goal below 70 mg/dL.  - check BP and orthostatic vitals at home for monitoring and record - keep hydrated and avoid dehydration  - continue outpt PT/OT - follow up on 08/31/16 as scheduled repeat CTA to monitoring    Marvel PlanJindong Azizah Lisle, MD PhD [facility] [address], [address] [place], [place] [address] [phone]

## 2016-07-21 ENCOUNTER — Encounter: Payer: Self-pay | Admitting: Occupational Therapy

## 2016-07-21 ENCOUNTER — Ambulatory Visit: Payer: BC Managed Care – PPO | Admitting: Rehabilitation

## 2016-07-21 ENCOUNTER — Encounter: Payer: Self-pay | Admitting: Rehabilitation

## 2016-07-21 ENCOUNTER — Ambulatory Visit: Payer: BC Managed Care – PPO

## 2016-07-21 ENCOUNTER — Ambulatory Visit: Payer: BC Managed Care – PPO | Admitting: Occupational Therapy

## 2016-07-21 VITALS — BP 160/104

## 2016-07-21 DIAGNOSIS — I69354 Hemiplegia and hemiparesis following cerebral infarction affecting left non-dominant side: Secondary | ICD-10-CM

## 2016-07-21 DIAGNOSIS — R41841 Cognitive communication deficit: Secondary | ICD-10-CM

## 2016-07-21 DIAGNOSIS — R2689 Other abnormalities of gait and mobility: Secondary | ICD-10-CM

## 2016-07-21 DIAGNOSIS — R41842 Visuospatial deficit: Secondary | ICD-10-CM

## 2016-07-21 DIAGNOSIS — R2681 Unsteadiness on feet: Secondary | ICD-10-CM

## 2016-07-21 DIAGNOSIS — M6281 Muscle weakness (generalized): Secondary | ICD-10-CM

## 2016-07-21 DIAGNOSIS — R293 Abnormal posture: Secondary | ICD-10-CM

## 2016-07-21 DIAGNOSIS — R471 Dysarthria and anarthria: Secondary | ICD-10-CM

## 2016-07-21 NOTE — Patient Instructions (Signed)
  Please complete the assigned speech therapy homework and return it to your next session.  

## 2016-07-21 NOTE — Therapy (Signed)
[facility] [address] [place], [place], [address] Phone: [phone]   Fax:  [phone]  Speech Language Pathology Treatment  Patient Details  Name: Shelly Mcdaniel MRN: [id number] Date of Birth: 1956-02-15 Referring Provider: Alysia Penna, MD  Encounter Date: 07/21/2016      End of Session - 07/21/16 1650    Visit Number 10   Number of Visits 17   Date for SLP Re-Evaluation 08/19/16   SLP Start Time 1449   SLP Stop Time  1530   SLP Time Calculation (min) 41 min   Activity Tolerance Patient tolerated treatment well      Past Medical History:  Diagnosis Date  . Diabetes mellitus without complication ([place])   . Hyperlipidemia   . Hypertension   . Stroke ([place])   . UTI (urinary tract infection)     Past Surgical History:  Procedure Laterality Date  . BACK SURGERY    . BREAST SURGERY    . COLONOSCOPY WITH PROPOFOL N/A 12/24/2013   Procedure: COLONOSCOPY WITH PROPOFOL;  Surgeon: [name], MD;  Location: [facility];  Service: Endoscopy;  Laterality: N/A;    There were no vitals filed for this visit.      Subjective Assessment - 07/21/16 1508    Subjective "[name] has a physical. He had to get his A1c and his thyroid checked."               ADULT SLP TREATMENT - 07/21/16 1515      General Information   Behavior/Cognition Cooperative;Pleasant mood;Distractible;Decreased sustained attention     Treatment Provided   Treatment provided Cognitive-Linquistic     Pain Assessment   Pain Assessment 0-10   Pain Score 1    Pain Location lt arm   Pain Descriptors / Indicators Sore;Aching   Pain Intervention(s) Monitored during session     Cognitive-Linquistic Treatment   Treatment focused on Cognition   Skilled Treatment Pt arrived with her homework 1/2 completed "We were doing it on the way over," pt stated. SLP targeted pt's sustained/selective attention today by working with her on her  homework - word categorization. Pt SLP mod cues and extra time consistently (i.e., 80% or more of the time) to remain on task. SLP provided pt with a homework folder as she reported homework was misplaced until just prior to leaving for her appointment this afternoon.     Assessment / Recommendations / Plan   Plan Continue with current plan of care     Progression Toward Goals   Progression toward goals Not progressing toward goals (comment)  pt attention, awareness skills hinder overall progress            SLP Short Term Goals - 07/19/16 1449      SLP SHORT TERM GOAL #1   Title pt will demo sustained/selective attention in NOVEL therapy tasks in a min-mod noisy environment for 10 minutes, with rare min A back to task   Time 1   Period Weeks   Status Not Met     SLP SHORT TERM GOAL #2   Title pt will demo awareness of errors in novel therapy tasks, 60% of the time, over three sessions   Time 1   Period Weeks   Status Not Met     SLP SHORT TERM GOAL #3   Title pt will demo exercises for incr'd breath support with occasional min A over three sessions   Time 1   Period Weeks  Status Deferred  due to focus on cognition - will keep goal          SLP Long Term Goals - 07/21/16 1656      SLP LONG TERM GOAL #1   Title pt will demo selective attention with simple therapy tasks for 5 minutes with rare min A needed for redirection back to the task over two sessions   Time 4   Period Weeks   Status Revised     SLP LONG TERM GOAL #2   Title pt will exhibit emergent awareness in simple NOVEL cognitive linguistic tasks 80% of the time over two sessions   Time 4   Period Weeks   Status On-going     SLP LONG TERM GOAL #3   Title pt will demonstrate WNL breath support for multiple sentence responses in 5 minute conversation over three sessions    Time 4   Period Weeks   Status On-going          Plan - 07/21/16 1651    Clinical Impression Statement Pt continues to  present today with deficits in cognitive-linguistics specifically sustained/selective attention skills and awareness. These deficits continue to affect other areas of cognitive linguistics such as problem solving, reasoning, executive function, and memory. She would beneift from skilled ST focusing on cognitive linguistics and dysarthria to decr caregiver burden and improve overall communication skills with family and community members.   Speech Therapy Frequency 2x / week   Duration --  8 weeks, or 16 therapy visits   Treatment/Interventions Patient/family education;Compensatory techniques;Internal/external aids;SLP instruction and feedback;Cognitive reorganization;Compensatory strategies;Oral motor exercises;Cueing hierarchy;Functional tasks  any or all may be used   Potential to Achieve Goals Good   Consulted and Agree with Plan of Care Patient;Family member/caregiver   Family Member Consulted husband      Patient will benefit from skilled therapeutic intervention in order to improve the following deficits and impairments:   Cognitive communication deficit  Dysarthria and anarthria    Speech Therapy Progress Note  Dates of Reporting Period: 06-01-16 to present  Objective Reports of Subjective Statement: Pt has been seen for 10 ST sessions focusing mainly on basic sustained and selective attention.  Objective Measurements: SLP continues to provide cues consistently for pt to remain on task.  Goal Update: See above  Plan: See pt for approx 4 more weeks. If pt's attention does not improve notably, she will likely be discharged due to max potential reached.  Reason Skilled Services are Required: Pt would cont to benefit from skilled ST to decr caregiver burden and to improve pt's QOL.    Problem List Patient Active Problem List   Diagnosis Date Noted  . 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 Orthostatic hypotension 04/08/2016  . Muscle cramps 04/08/2016  . Right  pontine cerebrovascular accident ([place]) 04/08/2016  . Spastic hemiparesis of left nondominant side ([place]) 04/08/2016  . Cerebral thrombosis with cerebral infarction 04/06/2016  . Basilar artery stenosis   . Dysarthria 04/05/2016  . History of CVA with residual deficit 04/05/2016  . Sinus tachycardia 04/05/2016  . Acute encephalopathy 04/05/2016  . Chronic pain 04/05/2016  . Other hyperlipidemia 04/05/2016  . Ischemic stroke ([place])   . 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 TIA (transient ischemic attack) 11/07/2014  . Uncontrolled hypertension 11/07/2014  . 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 [place],  [place], [address] Phone: [phone]   Fax:  [phone]   Name: Shelly Mcdaniel MRN: [id number] Date of Birth: 1955/08/26

## 2016-07-21 NOTE — Therapy (Signed)
[facility] [address] [place], [place], [address] Phone: [phone]   Fax:  [phone]  Physical Therapy Treatment  Patient Details  Name: Shelly Mcdaniel MRN: [id number] Date of Birth: 04/17/1956 Referring Provider: Rosalin Hawking, MD and Alysia Penna, MD  Encounter Date: 07/21/2016      PT End of Session - 07/21/16 1950    Visit Number 10   Number of Visits 21   Date for PT Re-Evaluation 08/01/16   Authorization Type BCBS 0 visit limit, 0 auth   PT Start Time 1532   PT Stop Time 1616   PT Time Calculation (min) 44 min   Equipment Utilized During Treatment Gait belt   Activity Tolerance Patient tolerated treatment well   Behavior During Therapy [facility] for tasks assessed/performed      Past Medical History:  Diagnosis Date  . Diabetes mellitus without complication ([place])   . Hyperlipidemia   . Hypertension   . Stroke ([place])   . UTI (urinary tract infection)     Past Surgical History:  Procedure Laterality Date  . BACK SURGERY    . BREAST SURGERY    . COLONOSCOPY WITH PROPOFOL N/A 12/24/2013   Procedure: COLONOSCOPY WITH PROPOFOL;  Surgeon: [name], MD;  Location: [facility];  Service: Endoscopy;  Laterality: N/A;    There were no vitals filed for this visit.      Subjective Assessment - 07/21/16 1541    Subjective Pt reports no big changes, has started some walking at home with husband to restroom.     Patient is accompained by: Family member   Limitations House hold activities;Walking   Patient Stated Goals "I want to work on my left extremity."    Currently in Pain? No/denies                         [facility] Adult PT Treatment/Exercise - 07/21/16 0001      Ambulation/Gait   Ambulation/Gait Yes   Ambulation/Gait Assistance 4: Min assist;3: Mod assist  +2 for safety and chair follow   Ambulation/Gait Assistance Details Worked on gait with L PFRW and L AFO.  Pt able to  perform 115' total (divided in half with seated rest break)during session at min/mod A level.  Cues for upright posture, improved forward lateral weight shift over LLE during stance, wider BOS-tends to step R LE at midline due to perceptual deficits), and facilitation at L hip during stance for improved glute med activation.  Tolerated very well.     Ambulation Distance (Feet) 115 Feet  in two bouts   Assistive device Left platform walker   Gait Pattern Step-through pattern;Decreased stance time - left;Decreased hip/knee flexion - left;Decreased dorsiflexion - left;Lateral trunk lean to right;Decreased arm swing - left;Decreased weight shift to left;Narrow base of support;Left circumduction;Left genu recurvatum;Ataxic   Ambulation Surface Level;Indoor     Neuro Re-ed    Neuro Re-ed Details  Continue to work on transitions into and out of tall kneeling and into/out of half kneeling (R) for improved activaiton of LLE (esp glute med) with forced use to carryover to gait.  Pt requires several rest breaks but did tolerate well.  BP WFL. +2A during these tasks for safety.                 PT Education - 07/21/16 1949    Education provided Yes   Education Details walking at home with family  Person(s) Educated Patient;Child(ren)   Methods Explanation   Comprehension Verbalized understanding          PT Short Term Goals - 07/14/16 1840      PT SHORT TERM GOAL #1   Title Pt will initiate HEP in order to indicate improved functional mobility and decreased fall risk.  (Target Date: 06/30/16)   Baseline 07/13/16: met today as HEP was issued today   Status Achieved     PT SHORT TERM GOAL #2   Title Pt will perform stand pivot transfer w/ LRAD at S level in order to increase independence at home.     Baseline 1/301/18: pt continues to need up to min assist both with and without AD (per previous notes for without AD, used AD today)   Status Not Met     PT SHORT TERM GOAL #3   Title Pt  will perform bed mobility at mod I level in order to indicate improved functional independence.     Baseline 07/13/16: pt at supervision level mostly, continues to need assistance to lift left LE onto surface and cues on sequencing/technique placing her at supervision   Status Partially Met     PT SHORT TERM GOAL #4   Title Pt will ambulate 100' w/ LRAD at min A level in order to indicate improved household ambulation.     Time 4   Period Weeks   Status Not Met     PT SHORT TERM GOAL #5   Title Pt will perform PASS and improve score 3 points in order to indicate improved functional mobility.     Time 4   Period Weeks   Status On-going           PT Long Term Goals - 06/02/16 1910      PT LONG TERM GOAL #1   Title Pt/spouse will be independent with HEP in order to indicate improved functional mobility and decreased fall risk.  (Target Date: 07/28/16)   Time 8   Period Weeks   Status New     PT LONG TERM GOAL #2   Title Pt will perform stand pivot transfers w/ LRAD at mod I level in order to indicate improved functional independence.    Time 8   Period Weeks   Status New     PT LONG TERM GOAL #3   Title Pt will ambulate x 200' w/ LRAD at S level over indoor surfaces in order to increase independence with household ambulation.     Time 8   Period Weeks   Status New     PT LONG TERM GOAL #4   Title Pt will perform dynamic standing balance with intermittent UE support x 5 mins in order to increase independence with ADLs.    Time 8   Period Weeks   Status New     PT LONG TERM GOAL #5   Title Pt will ambulate over paved outdoor surfaces w/ LRAD x 200' at S level in order to indicate initiation of community ambulation.    Time 8   Period Weeks   Status New               Plan - 07/21/16 1950    Clinical Impression Statement Skilled session continues to focus on NMR in forced WB positions (tall kneeling and half kneeling) as well as gait with L PFRW to get carryover  from NMR tasks.  tolerated well with longest tolerated gait distance today.  Rehab Potential Good   Clinical Impairments Affecting Rehab Potential pt motivated by may be limited due to medical complexity and poor cognition   PT Frequency 2x / week  then 3x/wk for 4 weeks   PT Duration 4 weeks  then 3x/wk for 4 week   PT Treatment/Interventions ADLs/Self Care Home Management;Canalith Repostioning;Electrical Stimulation;DME Instruction;Stair training;Gait training;Functional mobility training;Therapeutic activities;Therapeutic exercise;Balance training;Neuromuscular re-education;Patient/family education;Orthotic Fit/Training;Passive range of motion;Energy conservation;Taping;Vestibular;Visual/perceptual remediation/compensation   PT Next Visit Plan Check BP as needed, start looking at LTGs and renew for 4 weeks (write for 8 weeks in case), continue to check compliance with HEP, sit<>stand, transfers, standing balance to decrease pusher tendencies   Consulted and Agree with Plan of Care Patient;Family member/caregiver   Family Member Consulted Husband [name]      Patient will benefit from skilled therapeutic intervention in order to improve the following deficits and impairments:  Abnormal gait, Decreased activity tolerance, Decreased balance, Decreased cognition, Decreased coordination, Decreased endurance, Decreased knowledge of precautions, Decreased knowledge of use of DME, Decreased mobility, Decreased safety awareness, Decreased strength, Impaired perceived functional ability, Impaired flexibility, Impaired sensation, Impaired tone, Impaired UE functional use, Improper body mechanics, Postural dysfunction  Visit Diagnosis: Hemiplegia and hemiparesis following cerebral infarction affecting left non-dominant side (HCC)  Muscle weakness (generalized)  Abnormal posture  Unsteadiness on feet  Other abnormalities of gait and mobility     Problem List Patient Active Problem List    Diagnosis Date Noted  . 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 Orthostatic hypotension 04/08/2016  . Muscle cramps 04/08/2016  . Right pontine cerebrovascular accident ([place]) 04/08/2016  . Spastic hemiparesis of left nondominant side ([place]) 04/08/2016  . Cerebral thrombosis with cerebral infarction 04/06/2016  . Basilar artery stenosis   . Dysarthria 04/05/2016  . History of CVA with residual deficit 04/05/2016  . Sinus tachycardia 04/05/2016  . Acute encephalopathy 04/05/2016  . Chronic pain 04/05/2016  . Other hyperlipidemia 04/05/2016  . Ischemic stroke ([place])   . 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 TIA (transient ischemic attack) 11/07/2014  . Uncontrolled hypertension 11/07/2014  . Controlled type 2 diabetes mellitus with diabetic nephropathy ([place]) 11/07/2014    Cameron Sprang, PT, MPT [facility] [address] [place], [place], [address] Phone: [phone]   Fax:  [phone] 07/21/16, 7:53 PM  Name: Shelly Mcdaniel MRN: [id number] Date of Birth: October 04, 1955

## 2016-07-22 ENCOUNTER — Ambulatory Visit: Payer: BC Managed Care – PPO | Admitting: Rehabilitation

## 2016-07-22 ENCOUNTER — Ambulatory Visit: Payer: BC Managed Care – PPO | Admitting: Occupational Therapy

## 2016-07-22 ENCOUNTER — Ambulatory Visit: Payer: BC Managed Care – PPO

## 2016-07-22 ENCOUNTER — Encounter: Payer: Self-pay | Admitting: Rehabilitation

## 2016-07-22 DIAGNOSIS — M6281 Muscle weakness (generalized): Secondary | ICD-10-CM

## 2016-07-22 DIAGNOSIS — R293 Abnormal posture: Secondary | ICD-10-CM

## 2016-07-22 DIAGNOSIS — R471 Dysarthria and anarthria: Secondary | ICD-10-CM

## 2016-07-22 DIAGNOSIS — R2689 Other abnormalities of gait and mobility: Secondary | ICD-10-CM

## 2016-07-22 DIAGNOSIS — R2681 Unsteadiness on feet: Secondary | ICD-10-CM

## 2016-07-22 DIAGNOSIS — I69354 Hemiplegia and hemiparesis following cerebral infarction affecting left non-dominant side: Secondary | ICD-10-CM

## 2016-07-22 DIAGNOSIS — R41841 Cognitive communication deficit: Secondary | ICD-10-CM

## 2016-07-22 NOTE — Patient Instructions (Addendum)
  Please complete the assigned speech therapy homework before dinner, and return it to your next session.

## 2016-07-22 NOTE — Therapy (Signed)
[facility] [address] [place], [place], [address] Phone: [phone]   Fax:  [phone]  Occupational Therapy Treatment  Patient Details  Name: Shelly Mcdaniel MRN: [id number] Date of Birth: 09/06/1955 Referring Provider: Dr. Claudette Laws  Encounter Date: 07/21/2016      OT End of Session - 07/22/16 1619    Visit Number 11   Number of Visits 25   Date for OT Re-Evaluation 07/30/16   Authorization Type BC/BS   OT Start Time 1405   OT Stop Time 1445   OT Time Calculation (min) 40 min   Activity Tolerance Patient tolerated treatment well   Behavior During Therapy [facility] for tasks assessed/performed      Past Medical History:  Diagnosis Date  . Diabetes mellitus without complication (HCC)   . Hyperlipidemia   . Hypertension   . Stroke (HCC)   . UTI (urinary tract infection)     Past Surgical History:  Procedure Laterality Date  . BACK SURGERY    . BREAST SURGERY    . COLONOSCOPY WITH PROPOFOL N/A 12/24/2013   Procedure: COLONOSCOPY WITH PROPOFOL;  Surgeon: Charolett Bumpers, MD;  Location: [facility];  Service: Endoscopy;  Laterality: N/A;    Vitals:   07/21/16 1413 07/21/16 1422 07/21/16 1449  BP: (!) 165/113 (!) 159/108 (!) 160/104        Subjective Assessment - 07/22/16 1616    Pertinent History see Epic, Dr Roda Shutters has approved DBP less than 110 and HR 115-120 for therapy parameters.    Limitations elevated BP and orthostatic hypotension at times, monitor BP   Patient Stated Goals to be more indpendent   Currently in Pain? Yes   Pain Score 4    Pain Location Arm   Pain Orientation Left   Pain Descriptors / Indicators Aching   Pain Type Chronic pain   Pain Onset More than a month ago   Pain Frequency Intermittent   Aggravating Factors  malpositioning   Pain Relieving Factors reposistioning       Treatment: Pt transferred w/c to mat with min A. Gentle desenitization / massage to LUE  followed by pt reach for floor with bilateral UE's for gentle shoulder stretch, pt required max encouragement. Gentle P/ROM to digits, wrist and forearm as tolerated, followed by gentle weightbearing through left foream and elbow over bench with lateral weight shifts and trunk rotation, mod v.c and facilitation provided Plan to renew next week and decrease frequency to 2x week  after next week,( due to progress slower than anticipated) pt and husband are aware.                         OT Short Term Goals - 07/19/16 1400      OT SHORT TERM GOAL #1   Title Pt/family independent with initial HEP for LUE (due 06/29/16)   Time 4   Period Weeks   Status Achieved     OT SHORT TERM GOAL #2   Title Pt/family to verbalize understanding with proper positioning of LUE during activity and in bed to prevent pain including use of sling /splint prn   Time 4   Period Weeks   Status On-going  needs reinforcement     OT SHORT TERM GOAL #3   Title Pt to perform UB dressing with min assist using hemi techniques   Time 4   Period Weeks   Status On-going  mod/ max A  OT SHORT TERM GOAL #4   Title Pt will donn pants with mod A.   Time 4   Period Weeks   Status On-going  max A     OT SHORT TERM GOAL #5   Title Pt will use LUE as a stabilizer with min A 25 % of the time for ADLS/ functional activity   Time 4   Period Weeks   Status On-going  not consistently     OT SHORT TERM GOAL #6   Title Pt will perform bathing with min A for UB and mod A for LB   Time 4   Period Weeks   Status On-going  not fully addressed           OT Long Term Goals - 06/15/16 1250      OT LONG TERM GOAL #1   Title Independent with updated HEP prn (all LTG's due 07/30/15)   Time 8   Period Weeks   Status On-going     OT LONG TERM GOAL #2   Title Pt to demo active low range sh. flexion to 25 degrees in prep for reaching and use of arm as stabilizer   Time 8   Period Weeks   Status  On-going     OT LONG TERM GOAL #3   Title Pt to demo 25% gross finger flexion/extension to grasp/release cylindrical objects   Time 8   Period Weeks   Status On-going     OT LONG TERM GOAL #4   Title Pt will use LUE as a stabilizer for ADLs with min v.c. and pain less than or equal to 3/10   Time 8   Period Weeks   Status On-going     OT LONG TERM GOAL #5   Title Pt will perfrom toilet and shower transfers with min A.   Time 8   Period Weeks   Status On-going               Plan - 07/22/16 1617    Clinical Impression Statement Pt is progressing slowly towards goals limited by blood pressure issues and LUE hypersensitivity.   Rehab Potential Fair   OT Frequency 3x / week   OT Duration 8 weeks   OT Treatment/Interventions Self-care/ADL training;Moist Heat;DME and/or AE instruction;Splinting;Patient/family education;Therapeutic exercises;Compression bandaging;Therapeutic activities;Neuromuscular education;Functional Mobility Training;Passive range of motion;Cognitive remediation/compensation;Electrical Stimulation;Manual Therapy;Dry needling;Visual/perceptual remediation/compensation   Plan monitor BP, start checking long term goals, plan to renew next week, NMR for LUE and trunk   Consulted and Agree with Plan of Care Patient;Family member/caregiver   Family Member Consulted Husband - [name]      Patient will benefit from skilled therapeutic intervention in order to improve the following deficits and impairments:  Decreased activity tolerance, Decreased balance, Decreased cognition, Decreased knowledge of use of DME, Decreased coordination, Decreased endurance, Decreased range of motion, Decreased safety awareness, Decreased skin integrity, Decreased strength, Impaired perceived functional ability, Increased edema, Impaired sensation, Impaired tone, Impaired UE functional use, Impaired vision/preception, Pain, Improper body mechanics, Decreased mobility, Abnormal gait, Difficulty  walking, Decreased knowledge of precautions  Visit Diagnosis: Muscle weakness (generalized)  Abnormal posture  Visuospatial deficit    Problem List Patient Active Problem List   Diagnosis Date Noted  . 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 Orthostatic hypotension 04/08/2016  . Muscle cramps 04/08/2016  . 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 Cerebral thrombosis with cerebral infarction 04/06/2016  .  Basilar artery stenosis   . Dysarthria 04/05/2016  . History of CVA with residual deficit 04/05/2016  . Sinus tachycardia 04/05/2016  . Acute encephalopathy 04/05/2016  . Chronic pain 04/05/2016  . Other hyperlipidemia 04/05/2016  . Ischemic stroke (HCC)   . 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 TIA (transient ischemic attack) 11/07/2014  . Uncontrolled hypertension 11/07/2014  . Controlled type 2 diabetes mellitus with diabetic nephropathy (HCC) 11/07/2014    RINE,KATHRYN 07/22/2016, 4:25 PM  [facility] [address] [place], [place], [address] Phone: [phone]   Fax:  [phone]  Name: Shelly Mcdaniel MRN: [id number] Date of Birth: 03-14-1956

## 2016-07-22 NOTE — Therapy (Signed)
[facility] [address] [place], [place], [address] Phone: [phone]   Fax:  [phone]  Speech Language Pathology Treatment  Patient Details  Name: Shelly Mcdaniel MRN: [id number] Date of Birth: [birth year] Referring Provider: Alysia Penna, MD  Encounter Date: 07/22/2016      End of Session - 07/22/16 1547    Visit Number 11   Number of Visits 17   Date for SLP Re-Evaluation 08/19/16   SLP Start Time 1449   SLP Stop Time  1531   SLP Time Calculation (min) 42 min   Activity Tolerance Patient tolerated treatment well      Past Medical History:  Diagnosis Date  . Diabetes mellitus without complication ([place])   . Hyperlipidemia   . Hypertension   . Stroke ([place])   . UTI (urinary tract infection)     Past Surgical History:  Procedure Laterality Date  . BACK SURGERY    . BREAST SURGERY    . COLONOSCOPY WITH PROPOFOL N/A 12/24/2013   Procedure: COLONOSCOPY WITH PROPOFOL;  Surgeon: [name], MD;  Location: [facility];  Service: Endoscopy;  Laterality: N/A;    There were no vitals filed for this visit.      Subjective Assessment - 07/22/16 1508    Subjective "I was doing my homework last night." Pt fell asleep doing homework.               ADULT SLP TREATMENT - 07/22/16 1509      General Information   Behavior/Cognition Cooperative;Pleasant mood;Distractible;Decreased sustained attention     Treatment Provided   Treatment provided Cognitive-Linquistic     Cognitive-Linquistic Treatment   Treatment focused on Cognition   Skilled Treatment SLP strongly encouraged pt to do her homework before dinner so as to be less fatigued. In conversation pt's speech was mildly tangential, requiring SLP A to remain on topic rarely. In written/reading task (simple categorization) pt req'd mod-max SLP cues consistently for selective attention in quiet environment due to internal distraction.     Assessment / Recommendations / Plan   Plan Continue with current plan of care     Progression Toward Goals   Progression toward goals Not progressing toward goals (comment)  decr'd selective attention, awareness hindering progress          SLP Education - 07/22/16 1638    Education provided Yes   Education Details need to do homework prior to dinner   Person(s) Educated Patient;Spouse   Methods Explanation   Comprehension Verbalized understanding          SLP Short Term Goals - 07/19/16 1449      SLP SHORT TERM GOAL #1   Title pt will demo sustained/selective attention in NOVEL therapy tasks in a min-mod noisy environment for 10 minutes, with rare min A back to task   Time 1   Period Weeks   Status Not Met     SLP SHORT TERM GOAL #2   Title pt will demo awareness of errors in novel therapy tasks, 60% of the time, over three sessions   Time 1   Period Weeks   Status Not Met     SLP SHORT TERM GOAL #3   Title pt will demo exercises for incr'd breath support with occasional min A over three sessions   Time 1   Period Weeks   Status Deferred  due to focus on cognition - will keep goal  SLP Long Term Goals - 07/22/16 1639      SLP LONG TERM GOAL #1   Title pt will demo selective attention with simple therapy tasks for 5 minutes with rare min A needed for redirection back to the task over two sessions   Time 4   Period Weeks   Status On-going     SLP LONG TERM GOAL #2   Title pt will exhibit emergent awareness in simple NOVEL cognitive linguistic tasks 80% of the time over two sessions   Time 4   Period Weeks   Status On-going     SLP LONG TERM GOAL #3   Title pt will demonstrate WNL breath support for multiple sentence responses in 5 minute conversation over three sessions    Time 4   Period Weeks   Status On-going          Plan - 07/22/16 1548    Clinical Impression Statement Pt continues to present today with deficits in  cognitive-linguistics specifically sustained/selective attention skills and awareness. These deficits continue to affect other areas of cognitive linguistics such as problem solving, reasoning, executive function, and memory. She would beneift from skilled ST focusing on cognitive linguistics and dysarthria to decr caregiver burden and improve overall communication skills with family and community members.   Speech Therapy Frequency 2x / week   Duration --  8 weeks, or 16 therapy visits   Treatment/Interventions Patient/family education;Compensatory techniques;Internal/external aids;SLP instruction and feedback;Cognitive reorganization;Compensatory strategies;Oral motor exercises;Cueing hierarchy;Functional tasks  any or all may be used   Potential to Achieve Goals Good   Consulted and Agree with Plan of Care Patient;Family member/caregiver   Family Member Consulted husband      Patient will benefit from skilled therapeutic intervention in order to improve the following deficits and impairments:   Cognitive communication deficit  Dysarthria and anarthria    Problem List Patient Active Problem List   Diagnosis Date Noted  . 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 Orthostatic hypotension 04/08/2016  . Muscle cramps 04/08/2016  . Right pontine cerebrovascular accident ([place]) 04/08/2016  . Spastic hemiparesis of left nondominant side ([place]) 04/08/2016  . Cerebral thrombosis with cerebral infarction 04/06/2016  . Basilar artery stenosis   . Dysarthria 04/05/2016  . History of CVA with residual deficit 04/05/2016  . Sinus tachycardia 04/05/2016  . Acute encephalopathy 04/05/2016  . Chronic pain 04/05/2016  . Other hyperlipidemia 04/05/2016  . Ischemic stroke ([place])   . 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 TIA (transient ischemic attack) 11/07/2014  . Uncontrolled hypertension 11/07/2014  . Controlled type 2 diabetes mellitus with diabetic nephropathy ([place]) 11/07/2014    [facility] ,[place],  [place]  07/22/2016, 4:40 PM  [facility] [address] [place], [place], [address] Phone: [phone]   Fax:  [phone]   Name: Shelly Mcdaniel MRN: [id number] Date of Birth: July 19, 1955

## 2016-07-22 NOTE — Therapy (Signed)
[facility] [address] [place], [place], [address] Phone: [phone]   Fax:  [phone]  Physical Therapy Treatment  Patient Details  Name: Shelly Mcdaniel MRN: [id number] Date of Birth: 1956/03/10 Referring Provider: Rosalin Hawking, MD and Alysia Penna, MD  Encounter Date: 07/22/2016      PT End of Session - 07/22/16 1413    Visit Number 11   Number of Visits 21   Date for PT Re-Evaluation 08/01/16   Authorization Type [employer] 0 visit limit, 0 auth   PT Start Time 1405  PT late from previous session   PT Stop Time 1445   PT Time Calculation (min) 40 min   Equipment Utilized During Treatment Gait belt   Activity Tolerance Patient tolerated treatment well   Behavior During Therapy [facility] for tasks assessed/performed      Past Medical History:  Diagnosis Date  . Diabetes mellitus without complication ([facility])   . Hyperlipidemia   . Hypertension   . Stroke ([place])   . UTI (urinary tract infection)     Past Surgical History:  Procedure Laterality Date  . BACK SURGERY    . BREAST SURGERY    . COLONOSCOPY WITH PROPOFOL N/A 12/24/2013   Procedure: COLONOSCOPY WITH PROPOFOL;  Surgeon: [name], MD;  Location: [facility];  Service: Endoscopy;  Laterality: N/A;    There were no vitals filed for this visit.      Subjective Assessment - 07/22/16 1412    Subjective Reports no big changes since last visit, no falls.    Patient is accompained by: Family member   Limitations House hold activities;Walking   Patient Stated Goals "I want to work on my left extremity."    Currently in Pain? No/denies               NMR:  Worked in supine for LLE NMR; LLE only bridging (with LLE on mat) x 10 reps with good return therefore progressed to LLE off EOM on 6" step with single leg bridge 2 sets of 10 reps.  Note pt had increased difficulty with this task.  Elevating LLE from mat to/from floor x 10 reps with light  assist due to shoes/brace.  Remainder of session focused on blocked practice of sit<>stand from mat with emphasis on forward weight shift, increased LLE activation to "push" to midline and improved postural control in standing.  Pt able to progress to being able to perform task at S level with PT providing light assist at L knee to ensure safety.   Intermittent tactile cues at chest and L hip.  Ended session with standing advancing RLE forward and backward to increase LLE activation in stance.  Requires mod A with heavy cues and facilitation for LLE activation and trunk control.    Note that we did perform butterfly stretch to B hip adductors due to increased tightness (also tone noted during gait) and educated to begin this at home.                    PT Education - 07/22/16 1413    Education provided Yes   Person(s) Educated Patient   Methods Explanation   Comprehension Verbalized understanding          PT Short Term Goals - 07/14/16 1840      PT SHORT TERM GOAL #1   Title Pt will initiate HEP in order to indicate improved functional mobility and decreased fall risk.  (Target Date:  06/30/16)   Baseline 07/13/16: met today as HEP was issued today   Status Achieved     PT SHORT TERM GOAL #2   Title Pt will perform stand pivot transfer w/ LRAD at S level in order to increase independence at home.     Baseline 1/301/18: pt continues to need up to min assist both with and without AD (per previous notes for without AD, used AD today)   Status Not Met     PT SHORT TERM GOAL #3   Title Pt will perform bed mobility at mod I level in order to indicate improved functional independence.     Baseline 07/13/16: pt at supervision level mostly, continues to need assistance to lift left LE onto surface and cues on sequencing/technique placing her at supervision   Status Partially Met     PT SHORT TERM GOAL #4   Title Pt will ambulate 100' w/ LRAD at min A level in order to indicate  improved household ambulation.     Time 4   Period Weeks   Status Not Met     PT SHORT TERM GOAL #5   Title Pt will perform PASS and improve score 3 points in order to indicate improved functional mobility.     Time 4   Period Weeks   Status On-going           PT Long Term Goals - 06/02/16 1910      PT LONG TERM GOAL #1   Title Pt/spouse will be independent with HEP in order to indicate improved functional mobility and decreased fall risk.  (Target Date: 07/28/16)   Time 8   Period Weeks   Status New     PT LONG TERM GOAL #2   Title Pt will perform stand pivot transfers w/ LRAD at mod I level in order to indicate improved functional independence.    Time 8   Period Weeks   Status New     PT LONG TERM GOAL #3   Title Pt will ambulate x 200' w/ LRAD at S level over indoor surfaces in order to increase independence with household ambulation.     Time 8   Period Weeks   Status New     PT LONG TERM GOAL #4   Title Pt will perform dynamic standing balance with intermittent UE support x 5 mins in order to increase independence with ADLs.    Time 8   Period Weeks   Status New     PT LONG TERM GOAL #5   Title Pt will ambulate over paved outdoor surfaces w/ LRAD x 200' at S level in order to indicate initiation of community ambulation.    Time 8   Period Weeks   Status New               Plan - 07/22/16 1454    Clinical Impression Statement Skilled session focused on supine LLE NMR exercises, blocked practice of sit<>stand with decreasing levels of assist with emphasis on forward trunk lean both when sitting and standing and improved L hip protraction.  Tolerated well, but note increased fatigue, less initiation and decreased attention during session.    Rehab Potential Good   Clinical Impairments Affecting Rehab Potential pt motivated by may be limited due to medical complexity and poor cognition   PT Frequency 2x / week  then 3x/wk for 4 weeks   PT Duration 4  weeks  then 3x/wk for 4 week   PT  Treatment/Interventions ADLs/Self Care Home Management;Canalith Repostioning;Electrical Stimulation;DME Instruction;Stair training;Gait training;Functional mobility training;Therapeutic activities;Therapeutic exercise;Balance training;Neuromuscular re-education;Patient/family education;Orthotic Fit/Training;Passive range of motion;Energy conservation;Taping;Vestibular;Visual/perceptual remediation/compensation   PT Next Visit Plan Check BP as needed, start looking at LTGs and renew for 4 weeks (write for 8 weeks in case), continue to check compliance with HEP, sit<>stand, transfers, standing balance to decrease pusher tendencies   Consulted and Agree with Plan of Care Patient;Family member/caregiver   Family Member Consulted Husband [name]      Patient will benefit from skilled therapeutic intervention in order to improve the following deficits and impairments:  Abnormal gait, Decreased activity tolerance, Decreased balance, Decreased cognition, Decreased coordination, Decreased endurance, Decreased knowledge of precautions, Decreased knowledge of use of DME, Decreased mobility, Decreased safety awareness, Decreased strength, Impaired perceived functional ability, Impaired flexibility, Impaired sensation, Impaired tone, Impaired UE functional use, Improper body mechanics, Postural dysfunction  Visit Diagnosis: Hemiplegia and hemiparesis following cerebral infarction affecting left non-dominant side (HCC)  Muscle weakness (generalized)  Abnormal posture  Unsteadiness on feet  Other abnormalities of gait and mobility     Problem List Patient Active Problem List   Diagnosis Date Noted  . 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 Orthostatic hypotension 04/08/2016  . Muscle cramps 04/08/2016  . Right pontine cerebrovascular accident ([place]) 04/08/2016  . Spastic hemiparesis of left nondominant side ([place]) 04/08/2016  . Cerebral thrombosis with  cerebral infarction 04/06/2016  . Basilar artery stenosis   . Dysarthria 04/05/2016  . History of CVA with residual deficit 04/05/2016  . Sinus tachycardia 04/05/2016  . Acute encephalopathy 04/05/2016  . Chronic pain 04/05/2016  . Other hyperlipidemia 04/05/2016  . Ischemic stroke ([place])   . 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 TIA (transient ischemic attack) 11/07/2014  . Uncontrolled hypertension 11/07/2014  . Controlled type 2 diabetes mellitus with diabetic nephropathy ([place]) 11/07/2014    Cameron Sprang, PT, MPT [facility] [address] [place], [place], [address] Phone: [phone]   Fax:  [phone] 07/22/16, 3:02 PM  Name: Shelly Mcdaniel MRN: [id number] Date of Birth: 1956-03-25

## 2016-07-25 ENCOUNTER — Ambulatory Visit: Payer: BC Managed Care – PPO | Admitting: Occupational Therapy

## 2016-07-25 ENCOUNTER — Ambulatory Visit: Payer: BC Managed Care – PPO

## 2016-07-25 ENCOUNTER — Ambulatory Visit: Payer: BC Managed Care – PPO | Admitting: Rehabilitation

## 2016-07-28 ENCOUNTER — Ambulatory Visit: Payer: BC Managed Care – PPO | Admitting: Rehabilitation

## 2016-07-28 ENCOUNTER — Ambulatory Visit: Payer: BC Managed Care – PPO | Admitting: Occupational Therapy

## 2016-07-28 ENCOUNTER — Encounter: Payer: Self-pay | Admitting: Rehabilitation

## 2016-07-28 ENCOUNTER — Ambulatory Visit: Payer: BC Managed Care – PPO

## 2016-07-28 DIAGNOSIS — R2681 Unsteadiness on feet: Secondary | ICD-10-CM

## 2016-07-28 DIAGNOSIS — R2689 Other abnormalities of gait and mobility: Secondary | ICD-10-CM

## 2016-07-28 DIAGNOSIS — R293 Abnormal posture: Secondary | ICD-10-CM

## 2016-07-28 DIAGNOSIS — M6281 Muscle weakness (generalized): Secondary | ICD-10-CM

## 2016-07-28 DIAGNOSIS — R41841 Cognitive communication deficit: Secondary | ICD-10-CM

## 2016-07-28 DIAGNOSIS — I69354 Hemiplegia and hemiparesis following cerebral infarction affecting left non-dominant side: Secondary | ICD-10-CM | POA: Diagnosis not present

## 2016-07-28 DIAGNOSIS — R471 Dysarthria and anarthria: Secondary | ICD-10-CM

## 2016-07-29 ENCOUNTER — Ambulatory Visit: Payer: BC Managed Care – PPO | Admitting: *Deleted

## 2016-07-29 ENCOUNTER — Ambulatory Visit: Payer: BC Managed Care – PPO | Admitting: Rehabilitation

## 2016-07-29 ENCOUNTER — Encounter: Payer: BC Managed Care – PPO | Admitting: Occupational Therapy

## 2016-07-29 ENCOUNTER — Ambulatory Visit: Payer: BC Managed Care – PPO | Admitting: Occupational Therapy

## 2016-07-29 ENCOUNTER — Encounter: Payer: Self-pay | Admitting: *Deleted

## 2016-07-29 ENCOUNTER — Encounter: Payer: Self-pay | Admitting: Rehabilitation

## 2016-07-29 DIAGNOSIS — I69354 Hemiplegia and hemiparesis following cerebral infarction affecting left non-dominant side: Secondary | ICD-10-CM

## 2016-07-29 DIAGNOSIS — R2689 Other abnormalities of gait and mobility: Secondary | ICD-10-CM

## 2016-07-29 DIAGNOSIS — R41842 Visuospatial deficit: Secondary | ICD-10-CM

## 2016-07-29 DIAGNOSIS — R2681 Unsteadiness on feet: Secondary | ICD-10-CM

## 2016-07-29 DIAGNOSIS — M6281 Muscle weakness (generalized): Secondary | ICD-10-CM

## 2016-07-29 DIAGNOSIS — R293 Abnormal posture: Secondary | ICD-10-CM

## 2016-07-29 DIAGNOSIS — M79602 Pain in left arm: Secondary | ICD-10-CM

## 2016-07-29 DIAGNOSIS — R41841 Cognitive communication deficit: Secondary | ICD-10-CM

## 2016-07-29 DIAGNOSIS — I69315 Cognitive social or emotional deficit following cerebral infarction: Secondary | ICD-10-CM

## 2016-07-29 DIAGNOSIS — R278 Other lack of coordination: Secondary | ICD-10-CM

## 2016-07-29 DIAGNOSIS — M25512 Pain in left shoulder: Secondary | ICD-10-CM

## 2016-07-29 NOTE — Therapy (Signed)
[facility] [address] [place], [place], [address] Phone: [phone]   Fax:  [phone]  Speech Language Pathology Treatment  Patient Details  Name: Shelly Mcdaniel MRN: [id number] Date of Birth: [birth year] Referring Provider: Alysia Penna, MD  Encounter Date: 07/29/2016      End of Session - 07/29/16 1357    Visit Number 13   Number of Visits 17   Date for SLP Re-Evaluation 08/19/16   SLP Start Time 1105   SLP Stop Time  1145   SLP Time Calculation (min) 40 min   Activity Tolerance Patient tolerated treatment well      Past Medical History:  Diagnosis Date  . Diabetes mellitus without complication ([place])   . Hyperlipidemia   . Hypertension   . Stroke ([place])   . UTI (urinary tract infection)     Past Surgical History:  Procedure Laterality Date  . BACK SURGERY    . BREAST SURGERY    . COLONOSCOPY WITH PROPOFOL N/A 12/24/2013   Procedure: COLONOSCOPY WITH PROPOFOL;  Surgeon: [name], MD;  Location: [facility];  Service: Endoscopy;  Laterality: N/A;    There were no vitals filed for this visit.      Subjective Assessment - 07/29/16 1113    Subjective "Organizational activities are frustrating - It's like I'm not getting better. I'm on Ritalin now. It helps, but not much. Glendell Docker and I are going to talk about where we can go from here. 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 Yes   Pain Score 2    Pain Location Arm   Pain Orientation Left   Pain Descriptors / Indicators Aching   Pain Type Chronic pain   Pain Onset More than a month ago   Pain Frequency Intermittent   Aggravating Factors  stretching more than it wants to be stretched, ADLs   Pain Relieving Factors tylenol   Multiple Pain Sites Yes   Pain Score 2   Pain Location Wrist   Pain Orientation Left   Pain Descriptors / Indicators Aching   Pain Type Chronic  pain   Pain Onset More than a month ago   Pain Frequency Intermittent   Aggravating Factors  stretching, ADLs   Pain Relieving Factors tylenol               ADULT SLP TREATMENT - 07/29/16 1222      General Information   Behavior/Cognition Cooperative;Pleasant mood;Distractible;Decreased sustained attention     Treatment Provided   Treatment provided Cognitive-Linquistic     Cognitive-Linquistic Treatment   Treatment focused on Cognition   Skilled Treatment Skilled ST treatment focused on attention and problem solving/reasoning task in quiet environment. SLP provided visual attention activity, for pt to identify 3-5 differences between 2 photos. Pt required moderate verbal cues to maintain or bring attention back to the task, which was eventually terminated due to visual difficulty. Pt required max assistance to complete deductive reasoning puzzle #1, despite commenting early in the activity that it was easier than tasks presented in other sessions.      Assessment / Recommendations / Plan   Plan Continue with current plan of care     Progression Toward Goals   Progression toward goals Not progressing toward goals (comment)          SLP Education - 07/29/16 1356    Education provided Yes  Education Details cognitive activities to work on at home, computer activities and smartphone apps   Person(s) Educated Patient   Methods Explanation;Demonstration;Handout   Comprehension Verbalized understanding          SLP Short Term Goals - 07/29/16 [place] #1   Title pt will demo sustained/selective attention in [place] therapy tasks in a min-mod noisy environment for 10 minutes, with rare min A back to task   Status Not Met     SLP [place] #2   Title pt will demo awareness of errors in novel therapy tasks, 60% of the time, over three sessions   Status Not Met     SLP [place] #3   Title pt will demo exercises for incr'd breath  support with occasional min A over three sessions   Status Deferred          SLP Long Term Goals - 07/29/16 1359      SLP [place] #1   Title pt will demo selective attention with simple therapy tasks for 5 minutes with rare min A needed for redirection back to the task over two sessions   Time 3   Period Weeks   Status On-going     SLP LONG TERM GOAL #2   Title pt will exhibit emergent awareness in simple NOVEL cognitive linguistic tasks 80% of the time over two sessions   Time 3   Period Weeks   Status On-going     SLP LONG TERM GOAL #3   Title pt will demonstrate WNL breath support for multiple sentence responses in 5 minute conversation over three sessions    Time 3   Period Weeks   Status On-going          Plan - 07/29/16 1357    Clinical Impression Statement Pt continues to distract herself and requires verbal cues to redirect attention to task, even in quiet environment. Pt appears frustrated that she isn't making progress, and wonders if Ritalin will help eventually. Continued ST intervention is recommended for education and to provide suggestions and strategies for cognitive exercise at home following DC from speech therapy. Pt was given written list of cognitive activities and computer sites/apps she may try for cognitive training.    Speech Therapy Frequency 2x / week   Duration --  8 weeks of 16 visits   Treatment/Interventions Patient/family education;Compensatory techniques;Internal/external aids;SLP instruction and feedback;Cognitive reorganization;Compensatory strategies;Oral motor exercises;Cueing hierarchy;Functional tasks   Potential to Achieve Goals [place]   Potential Considerations Ability to learn/carryover information;Family/community support;Cooperation/participation level;Previous level of function   Consulted and Agree with Plan of Care Patient      Patient will benefit from skilled therapeutic intervention in order to improve the following  deficits and impairments:   Cognitive communication deficit    Problem List Patient Active Problem List   Diagnosis Date Noted  . 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 Orthostatic hypotension 04/08/2016  . Muscle cramps 04/08/2016  . Right pontine cerebrovascular accident ([place]) 04/08/2016  . Spastic hemiparesis of left nondominant side ([place]) 04/08/2016  . Cerebral thrombosis with cerebral infarction 04/06/2016  . Basilar artery stenosis   . Dysarthria 04/05/2016  . History of CVA with residual deficit 04/05/2016  . Sinus tachycardia 04/05/2016  . Acute encephalopathy 04/05/2016  . Chronic pain 04/05/2016  . Other hyperlipidemia 04/05/2016  . Ischemic stroke ([place])   . 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 TIA (transient ischemic attack)  11/07/2014  . Uncontrolled hypertension 11/07/2014  . Controlled type 2 diabetes mellitus with diabetic nephropathy ([place]) 11/07/2014   Celia B. Frederick, MSP, CCC-SLP  Shonna Chock 07/29/2016, 2:01 PM  [facility] [address] [place], [place], [address] Phone: [phone]   Fax:  [phone]   Name: Shelly Mcdaniel MRN: [id number] Date of Birth: 1956/06/01

## 2016-07-29 NOTE — Therapy (Signed)
[facility] [address] [place], [place], [address] Phone: [phone]   Fax:  [phone]  Physical Therapy Treatment  Patient Details  Name: Shelly Mcdaniel MRN: [id number] Date of Birth: December 11, 1955 Referring Provider: Rosalin Hawking, MD and Alysia Penna, MD  Encounter Date: 07/28/2016      PT End of Session - 07/29/16 0903    Visit Number 12   Number of Visits 21   Date for PT Re-Evaluation 08/01/16   Authorization Type BCBS 0 visit limit, 0 auth   PT Start Time 1448   PT Stop Time 1531   PT Time Calculation (min) 43 min   Equipment Utilized During Treatment Gait belt   Activity Tolerance Patient tolerated treatment well   Behavior During Therapy [facility] for tasks assessed/performed      Past Medical History:  Diagnosis Date  . Diabetes mellitus without complication ([place])   . Hyperlipidemia   . Hypertension   . Stroke ([place])   . UTI (urinary tract infection)     Past Surgical History:  Procedure Laterality Date  . BACK SURGERY    . BREAST SURGERY    . COLONOSCOPY WITH PROPOFOL N/A 12/24/2013   Procedure: COLONOSCOPY WITH PROPOFOL;  Surgeon: [name], MD;  Location: [facility];  Service: Endoscopy;  Laterality: N/A;    There were no vitals filed for this visit.      Subjective Assessment - 07/28/16 1456    Subjective She almost fell getting off of toilet from bed.  Leg got stuck, but she is okay.     Patient is accompained by: Family member   Limitations House hold activities;Walking   Patient Stated Goals "I want to work on my left extremity."    Currently in Pain? Yes   Pain Score 1    Pain Location Arm   Pain Orientation Left   Pain Descriptors / Indicators Aching   Pain Type Chronic pain   Pain Onset More than a month ago   Pain Frequency Intermittent   Aggravating Factors  malpositioning   Pain Relieving Factors repositioning                         [facility] Adult  PT Treatment/Exercise - 07/28/16 1500      Transfers   Transfers Sit to Stand;Stand to [employer] Transfers   Sit to Stand 4: Min guard;With upper extremity assist   Sit to Stand Details Verbal cues for sequencing;Verbal cues for technique;Verbal cues for precautions/safety;Tactile cues for weight beaing   Sit to Stand Details (indicate cue type and reason) Provided light approximation through LLE when standing, however pt able to complete on her own with cues.    Stand to Sit 4: Min guard;With upper extremity assist   Stand to Sit Details (indicate cue type and reason) Verbal cues for sequencing;Verbal cues for technique;Verbal cues for precautions/safety;Tactile cues for weight beaing   Stand Pivot Transfers 4: Min assist  with L PFRW   Stand Pivot Transfer Details (indicate cue type and reason) Assessed transfer today with L PFRW.  Note that she requires increased time to initiate self managment of LUE with RUE and max cues to do so.  Some assist to negotiate RW during transfer and min A to remove LUE from platform prior to standing.      Ambulation/Gait   Ambulation/Gait Yes   Ambulation/Gait Assistance 4: Min assist   Ambulation/Gait Assistance Details Performed gait up to  50' x 1, and another 67' x 1 with L PFRW at min A level.  Continue to provide assist to steer RW at times due to LUE weakness as well as facilitation at L hip for improved L hip protraction during stance.  This seemed to work well, therefore demonstrated on husband [name] for improved carryover at home.     Ambulation Distance (Feet) --  see above   Assistive device Left platform walker   Gait Pattern Step-through pattern;Decreased stance time - left;Decreased hip/knee flexion - left;Decreased dorsiflexion - left;Lateral trunk lean to right;Decreased arm swing - left;Decreased weight shift to left;Narrow base of support;Left circumduction;Left genu recurvatum;Ataxic   Ambulation Surface Level;Indoor   Gait Comments  Note that during gait and transfers, had pt perform self management of LUE to carryover at home for increased independence.  She requires max amount of time to initiate, attend to and self manage LUE onto and off of platform during these activities.                  PT Education - 07/28/16 1457    Education provided Yes   Education Details education on not getting up without Cecelia Byars) Educated Patient   Methods Explanation   Comprehension Verbalized understanding          PT Short Term Goals - 07/14/16 1840      PT SHORT TERM GOAL #1   Title Pt will initiate HEP in order to indicate improved functional mobility and decreased fall risk.  (Target Date: 06/30/16)   Baseline 07/13/16: met today as HEP was issued today   Status Achieved     PT SHORT TERM GOAL #2   Title Pt will perform stand pivot transfer w/ LRAD at S level in order to increase independence at home.     Baseline 1/301/18: pt continues to need up to min assist both with and without AD (per previous notes for without AD, used AD today)   Status Not Met     PT SHORT TERM GOAL #3   Title Pt will perform bed mobility at mod I level in order to indicate improved functional independence.     Baseline 07/13/16: pt at supervision level mostly, continues to need assistance to lift left LE onto surface and cues on sequencing/technique placing her at supervision   Status Partially Met     PT SHORT TERM GOAL #4   Title Pt will ambulate 100' w/ LRAD at min A level in order to indicate improved household ambulation.     Time 4   Period Weeks   Status Not Met     PT SHORT TERM GOAL #5   Title Pt will perform PASS and improve score 3 points in order to indicate improved functional mobility.     Time 4   Period Weeks   Status On-going           PT Long Term Goals - 07/28/16 1458      PT LONG TERM GOAL #1   Title Pt/spouse will be independent with HEP in order to indicate improved functional mobility and  decreased fall risk.  (Target Date: 07/28/16)   Baseline met 07/28/16   Time 8   Period Weeks   Status Achieved     PT LONG TERM GOAL #2   Title Pt will perform stand pivot transfers w/ LRAD at mod I level in order to indicate improved functional independence.    Baseline min A level with  L PFRW on 07/28/16   Time 8   Period Weeks   Status Partially Met     PT LONG TERM GOAL #3   Title Pt will ambulate x 200' w/ LRAD at S level over indoor surfaces in order to increase independence with household ambulation.     Baseline up to 50-80' at a time with L PFRW with min A   Time 8   Period Weeks   Status Partially Met     PT LONG TERM GOAL #4   Title Pt will perform dynamic standing balance with intermittent UE support x 5 mins in order to increase independence with ADLs.    Time 8   Period Weeks   Status New     PT LONG TERM GOAL #5   Title Pt will ambulate over paved outdoor surfaces w/ LRAD x 200' at S level in order to indicate initiation of community ambulation.    Baseline not appropriate at this time   Time 8   Period Weeks   Status Deferred               Plan - 07/29/16 0903    Clinical Impression Statement Skilled session to continue to educate pt on not getting up when husband not at home.  Also started to look at [place].  Pt not able to meet transfer goal with RW and also requires up to min A for gait for limited distances.  Will renew for 8 weeks with the liklihood of only continuing for 4 weeks pending progress.     Rehab Potential Good   Clinical Impairments Affecting Rehab Potential pt motivated by may be limited due to medical complexity and poor cognition   PT Frequency 2x / week   PT Duration 8 weeks  pending progress, likely only 4 weeks   PT Treatment/Interventions ADLs/Self Care Home Management;Canalith Repostioning;Electrical Stimulation;DME Instruction;Stair training;Gait training;Functional mobility training;Therapeutic activities;Therapeutic  exercise;Balance training;Neuromuscular re-education;Patient/family education;Orthotic Fit/Training;Passive range of motion;Energy conservation;Taping;Vestibular;Visual/perceptual remediation/compensation   PT Next Visit Plan Check BP as needed, continue to check compliance with HEP, sit<>stand, transfers, standing balance to decrease pusher tendencies   Consulted and Agree with Plan of Care Patient;Family member/caregiver   Family Member Consulted Husband [name]      Patient will benefit from skilled therapeutic intervention in order to improve the following deficits and impairments:  Abnormal gait, Decreased activity tolerance, Decreased balance, Decreased cognition, Decreased coordination, Decreased endurance, Decreased knowledge of precautions, Decreased knowledge of use of DME, Decreased mobility, Decreased safety awareness, Decreased strength, Impaired perceived functional ability, Impaired flexibility, Impaired sensation, Impaired tone, Impaired UE functional use, Improper body mechanics, Postural dysfunction  Visit Diagnosis: Hemiplegia and hemiparesis following cerebral infarction affecting left non-dominant side (HCC)  Muscle weakness (generalized)  Abnormal posture  Unsteadiness on feet  Other abnormalities of gait and mobility     Problem List Patient Active Problem List   Diagnosis Date Noted  . 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 Orthostatic hypotension 04/08/2016  . Muscle cramps 04/08/2016  . Right pontine cerebrovascular accident ([place]) 04/08/2016  . Spastic hemiparesis of left nondominant side ([place]) 04/08/2016  . Cerebral thrombosis with cerebral infarction 04/06/2016  . Basilar artery stenosis   . Dysarthria 04/05/2016  . History of CVA with residual deficit 04/05/2016  . Sinus tachycardia 04/05/2016  . Acute encephalopathy 04/05/2016  . Chronic pain 04/05/2016  . Other hyperlipidemia 04/05/2016  . Ischemic stroke ([place])   . 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  TIA (transient ischemic attack)  11/07/2014  . Uncontrolled hypertension 11/07/2014  . Controlled type 2 diabetes mellitus with diabetic nephropathy ([place]) 11/07/2014    Shelly Mcdaniel, PT, MPT [facility] [address] [place], [place], [address] Phone: [phone]   Fax:  [phone] 07/29/16, 9:08 AM  Name: Shelly Mcdaniel MRN: [id number] Date of Birth: [birth year]

## 2016-07-29 NOTE — Therapy (Signed)
[facility] [address] [place], [place], [address] Phone: [phone]   Fax:  [phone]  Occupational Therapy Treatment  Patient Details  Name: Shelly Mcdaniel MRN: [id number] Date of Birth: 08/29/1955 Referring Provider: Dr. Claudette LawsAndrew Kirsteins  Encounter Date: 07/29/2016      OT End of Session - 07/29/16 1547    Visit Number 12   Number of Visits 25   Date for OT Re-Evaluation 07/30/16   Authorization Type BC/BS   OT Start Time 1230   OT Stop Time 1315   OT Time Calculation (min) 45 min   Activity Tolerance Patient tolerated treatment well   Behavior During Therapy [facility] for tasks assessed/performed      Past Medical History:  Diagnosis Date  . Diabetes mellitus without complication (HCC)   . Hyperlipidemia   . Hypertension   . Stroke (HCC)   . UTI (urinary tract infection)     Past Surgical History:  Procedure Laterality Date  . BACK SURGERY    . BREAST SURGERY    . COLONOSCOPY WITH PROPOFOL N/A 12/24/2013   Procedure: COLONOSCOPY WITH PROPOFOL;  Surgeon: Charolett BumpersMartin K Johnson, MD;  Location[facility];  Service: Endoscopy;  Laterality: N/A;    There were no vitals filed for this visit.      Subjective Assessment - 07/29/16 1536    Subjective  Patient reports that dressing task is making her feel very confused.  "I hope I don't have another UTI. "   Pertinent History see Epic, Dr Roda ShuttersXu has approved DBP less than 110 and HR 115-120 for therapy parameters.    Limitations elevated BP and orthostatic hypotension at times, monitor BP   Patient Stated Goals to be more indpendent   Currently in Pain? No/denies   Pain Score 0-No pain                      OT Treatments/Exercises (OP) - 07/29/16 1538      ADLs   UB Dressing Worked on rote repetition of upper body dressing to routinize task.  Patient apraxic, with self distacting behavior.  Rote repetition of 4 steps of upper body dressing (pull  over shirt)  Husband not present for this session - verbally reviewed task with him at end of session.     LB Dressing Patient able to don leggins with mod assist.  Patient encouraged to participate more in thiis task at home, and encouraged to stand at counter or sink  and first obtain balance before pulling up pants.  Patient unable to safely stand without intermittent min assist.  Patient consistently drifts to weaker left side, and is unaware until well beyond her ability to self correct.                 OT Education - 07/29/16 1547    Education provided Yes   Education Details one handed dressing techniques   Person(s) Educated Patient;Spouse   Methods Explanation;Demonstration;Verbal cues;Handout   Comprehension Need further instruction;Verbal cues required;Tactile cues required          OT Short Term Goals - 07/29/16 1549      OT SHORT TERM GOAL #1   Title Pt/family independent with initial HEP for LUE (due 06/29/16)   Status Achieved     OT SHORT TERM GOAL #2   Title Pt/family to verbalize understanding with proper positioning of LUE during activity and in bed to prevent pain including use of sling /splint prn  Status On-going     OT SHORT TERM GOAL #3   Title Pt to perform UB dressing with min assist using hemi techniques   Status On-going     OT SHORT TERM GOAL #4   Title Pt will donn pants with mod A.   Status Achieved  elastic waist pants     OT SHORT TERM GOAL #5   Title Pt will use LUE as a stabilizer with min A 25 % of the time for ADLS/ functional activity   Status On-going     OT SHORT TERM GOAL #6   Title Pt will perform bathing with min A for UB and mod A for LB   Status On-going           OT Long Term Goals - 07/29/16 1550      OT LONG TERM GOAL #1   Title Independent with updated HEP prn (all LTG's due 07/30/15)   Status On-going     OT LONG TERM GOAL #2   Title Pt to demo active low range sh. flexion to 25 degrees in prep for  reaching and use of arm as stabilizer   Status On-going     OT LONG TERM GOAL #3   Title Pt to demo 25% gross finger flexion/extension to grasp/release cylindrical objects   Status On-going     OT LONG TERM GOAL #4   Title Pt will use LUE as a stabilizer for ADLs with min v.c. and pain less than or equal to 3/10   Status On-going     OT LONG TERM GOAL #5   Title Pt will perfrom toilet and shower transfers with min A.   Status Achieved               Plan - 07/29/16 1548    Clinical Impression Statement Patient is showing improved functional mobility, however, has missed a good deal of therapy due to illness.  Will recertify for addtiopnal time to address reamining OT goals.   Rehab Potential Fair   OT Frequency 2x / week   OT Duration 8 weeks   OT Treatment/Interventions Self-care/ADL training;Moist Heat;DME and/or AE instruction;Splinting;Patient/family education;Therapeutic exercises;Compression bandaging;Therapeutic activities;Neuromuscular education;Functional Mobility Training;Passive range of motion;Cognitive remediation/compensation;Electrical Stimulation;Manual Therapy;Dry needling;Visual/perceptual remediation/compensation   Plan ADL - Dressing / bathing, postural control, static to dynamic stand balance, any transitional movements   Consulted and Agree with Plan of Care Patient      Patient will benefit from skilled therapeutic intervention in order to improve the following deficits and impairments:  Decreased activity tolerance, Decreased balance, Decreased cognition, Decreased knowledge of use of DME, Decreased coordination, Decreased endurance, Decreased range of motion, Decreased safety awareness, Decreased skin integrity, Decreased strength, Impaired perceived functional ability, Increased edema, Impaired sensation, Impaired tone, Impaired UE functional use, Impaired vision/preception, Pain, Improper body mechanics, Decreased mobility, Abnormal gait, Difficulty  walking, Decreased knowledge of precautions  Visit Diagnosis: Hemiplegia and hemiparesis following cerebral infarction affecting left non-dominant side (HCC) - Plan: Ot plan of care cert/re-cert  Muscle weakness (generalized) - Plan: Ot plan of care cert/re-cert  Abnormal posture - Plan: Ot plan of care cert/re-cert  Unsteadiness on feet - Plan: Ot plan of care cert/re-cert  Visuospatial deficit - Plan: Ot plan of care cert/re-cert  Other lack of coordination - Plan: Ot plan of care cert/re-cert  Acute pain of left shoulder - Plan: Ot plan of care cert/re-cert  Cognitive social or emotional deficit following cerebral infarction - Plan: Ot plan of care cert/re-cert  Pain in left arm - Plan: Ot plan of care cert/re-cert    Problem List Patient Active Problem List   Diagnosis Date Noted  . 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 Orthostatic hypotension 04/08/2016  . Muscle cramps 04/08/2016  . 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 Cerebral thrombosis with cerebral infarction 04/06/2016  . Basilar artery stenosis   . Dysarthria 04/05/2016  . History of CVA with residual deficit 04/05/2016  . Sinus tachycardia 04/05/2016  . Acute encephalopathy 04/05/2016  . Chronic pain 04/05/2016  . Other hyperlipidemia 04/05/2016  . Ischemic stroke (HCC)   . 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 TIA (transient ischemic attack) 11/07/2014  . Uncontrolled hypertension 11/07/2014  . Controlled type 2 diabetes mellitus with diabetic nephropathy (HCC) 11/07/2014    Collier Salina, OTR/L 07/29/2016, 3:56 PM  [facility] [address] [place], [place], [address] Phone: [phone]   Fax:  [phone]  Name: Shelly Mcdaniel MRN: [id number] Date of Birth: 18-Jul-1955

## 2016-07-29 NOTE — Therapy (Signed)
[facility] [address] [place], [place], [address] Phone: [phone]   Fax:  [phone]  Physical Therapy Treatment  Patient Details  Name: Shelly Mcdaniel MRN: [id number] Date of Birth: March 18, 1956 Referring Provider: Rosalin Hawking, MD and Alysia Penna, MD  Encounter Date: 07/29/2016      PT End of Session - 07/29/16 1516    Visit Number 13   Number of Visits 29   Date for PT Re-Evaluation 09/27/16   Authorization Type BCBS 0 visit limit, 0 auth   PT Start Time 1146   PT Stop Time 1230   PT Time Calculation (min) 44 min   Equipment Utilized During Treatment Gait belt   Activity Tolerance Patient tolerated treatment well   Behavior During Therapy WFL for tasks assessed/performed      Past Medical History:  Diagnosis Date  . Diabetes mellitus without complication ([place])   . Hyperlipidemia   . Hypertension   . Stroke ([place])   . UTI (urinary tract infection)     Past Surgical History:  Procedure Laterality Date  . BACK SURGERY    . BREAST SURGERY    . COLONOSCOPY WITH PROPOFOL N/A 12/24/2013   Procedure: COLONOSCOPY WITH PROPOFOL;  Surgeon: [name], MD;  Location: [facility];  Service: Endoscopy;  Laterality: N/A;    There were no vitals filed for this visit.      Subjective Assessment - 07/29/16 1510    Subjective Pt reports feeling more awake today.    Limitations House hold activities;Walking   Patient Stated Goals "I want to work on my left extremity."    Currently in Pain? Yes   Pain Score 2    Pain Location Arm   Pain Orientation Left   Pain Descriptors / Indicators Aching   Pain Type Chronic pain   Pain Onset More than a month ago   Pain Frequency Intermittent   Aggravating Factors  stretching   Pain Relieving Factors tylenol                         [facility] Adult PT Treatment/Exercise - 07/29/16 0001      Transfers   Transfers Sit to Stand;Stand to Sit;Stand  Pivot Transfers   Sit to Stand 4: Min guard;4: Min assist   Sit to Stand Details Verbal cues for sequencing;Verbal cues for technique;Verbal cues for precautions/safety;Tactile cues for weight beaing   Sit to Stand Details (indicate cue type and reason) Pt mostly min/guard however upon first stand, did require min A for forward weight shift and to elevate from chair.    Stand to Sit 4: Min guard;With upper extremity assist   Stand Pivot Transfers 4: Min assist   Stand Pivot Transfer Details (indicate cue type and reason) without device.  cues for weight shift and sequencing with LEs.      Ambulation/Gait   Ambulation/Gait Yes   Ambulation/Gait Assistance 4: Min assist   Ambulation/Gait Assistance Details Assessed gait today with LBQC.  PT feels that she can better facilitate improved postural control and stability at hip with this vs with PFRW, however would like to discuss with OT to compare benefits of having WB through L elbow.  Pt and husband verbalized understanding.   Continue to facilitate at pts rib cage for upright posture throughout and intermittently at L hip for increased forward protraction over L LE during stance phase of gait.     Ambulation Distance (Feet) 35 Feet  then another 28'    Assistive device Large base quad cane   Gait Pattern Step-through pattern;Decreased stance time - left;Decreased hip/knee flexion - left;Decreased dorsiflexion - left;Lateral trunk lean to right;Decreased arm swing - left;Decreased weight shift to left;Narrow base of support;Left circumduction;Left genu recurvatum;Ataxic   Ambulation Surface Level;Indoor     Balance   Balance Assessed Yes     Dynamic Standing Balance   Dynamic Standing - Comments Pt able to stand at counter top at min/guard level with intermittent RUE support but also while simulating ADL tasks and reaching into cabinets from waist level upward to the R and L with RUE.  Encouraged her (and discussed with spouse) increasing this  activity at home with cues for increased L lateral weight shift and L LE activation.  Performed actiivty at counter for up to 6 mins                PT Education - 07/29/16 1515    Education provided Yes   Education Details performing more standing activities in bathroom and kitchen   Person(s) Educated Patient;Spouse   Methods Explanation;Demonstration   Comprehension Verbalized understanding          PT Short Term Goals - 07/14/16 1840      PT SHORT TERM GOAL #1   Title Pt will initiate HEP in order to indicate improved functional mobility and decreased fall risk.  (Target Date: 06/30/16)   Baseline 07/13/16: met today as HEP was issued today   Status Achieved     PT SHORT TERM GOAL #2   Title Pt will perform stand pivot transfer w/ LRAD at S level in order to increase independence at home.     Baseline 1/301/18: pt continues to need up to min assist both with and without AD (per previous notes for without AD, used AD today)   Status Not Met     PT SHORT TERM GOAL #3   Title Pt will perform bed mobility at mod I level in order to indicate improved functional independence.     Baseline 07/13/16: pt at supervision level mostly, continues to need assistance to lift left LE onto surface and cues on sequencing/technique placing her at supervision   Status Partially Met     PT SHORT TERM GOAL #4   Title Pt will ambulate 100' w/ LRAD at min A level in order to indicate improved household ambulation.     Time 4   Period Weeks   Status Not Met     PT SHORT TERM GOAL #5   Title Pt will perform PASS and improve score 3 points in order to indicate improved functional mobility.     Time 4   Period Weeks   Status On-going           PT Long Term Goals - 07/29/16 1220      PT LONG TERM GOAL #1   Title Pt/spouse will be independent with HEP in order to indicate improved functional mobility and decreased fall risk.  (Modified Target Date: 08/28/16)   Baseline met 07/28/16    Time 8   Period Weeks   Status Achieved     PT LONG TERM GOAL #2   Title Pt will perform stand pivot transfers w/ LRAD at S level in order to indicate improved functional independence. 08/28/16   Baseline min A level with L PFRW on 07/28/16   Time 4   Period Weeks   Status Revised     PT  LONG TERM GOAL #3   Title Pt will ambulate x 100' w/ LRAD at S level over indoor surfaces in order to increase independence with household ambulation.  08/28/16   Baseline up to 50-80' at a time with L PFRW with min A   Time 4   Period Weeks   Status Revised     PT LONG TERM GOAL #4   Title Pt will perform dynamic standing balance with intermittent UE support x 10 mins at S level in order to increase independence with ADLs. 08/28/16   Baseline revised 07/29/16 to indicate assist level    Time 4   Period Weeks   Status Revised     PT LONG TERM GOAL #5   Title Pt will ambulate over paved outdoor surfaces w/ LRAD x 200' at S level in order to indicate initiation of community ambulation.    Baseline not appropriate at this time   Time 8   Period Weeks   Status Deferred               Plan - 07/29/16 1520    Clinical Impression Statement Skilled session addressed remaining LTG as well as gait with [facility] to see if this would be more efficient for pt at home.  PT to renew pt for 2x/wk for 4 more weeks (will do recert for 60 days in case of absences).  Pt and husband aware.     Rehab Potential Good   Clinical Impairments Affecting Rehab Potential pt motivated by may be limited due to medical complexity and poor cognition   PT Frequency 2x / week   PT Duration 4 weeks  pending progress, likely only 4 weeks   PT Treatment/Interventions ADLs/Self Care Home Management;Canalith Repostioning;Electrical Stimulation;DME Instruction;Stair training;Gait training;Functional mobility training;Therapeutic activities;Therapeutic exercise;Balance training;Neuromuscular re-education;Patient/family education;Orthotic  Fit/Training;Passive range of motion;Energy conservation;Taping;Vestibular;Visual/perceptual remediation/compensation   PT Next Visit Plan Check BP as needed, continue to check compliance with HEP, sit<>stand, transfers, standing balance to decrease pusher tendencies   Consulted and Agree with Plan of Care Patient;Family member/caregiver   Family Member Consulted Husband [name]      Patient will benefit from skilled therapeutic intervention in order to improve the following deficits and impairments:  Abnormal gait, Decreased activity tolerance, Decreased balance, Decreased cognition, Decreased coordination, Decreased endurance, Decreased knowledge of precautions, Decreased knowledge of use of DME, Decreased mobility, Decreased safety awareness, Decreased strength, Impaired perceived functional ability, Impaired flexibility, Impaired sensation, Impaired tone, Impaired UE functional use, Improper body mechanics, Postural dysfunction  Visit Diagnosis: Hemiplegia and hemiparesis following cerebral infarction affecting left non-dominant side (HCC)  Muscle weakness (generalized)  Abnormal posture  Unsteadiness on feet  Other abnormalities of gait and mobility     Problem List Patient Active Problem List   Diagnosis Date Noted  . 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 Orthostatic hypotension 04/08/2016  . Muscle cramps 04/08/2016  . Right pontine cerebrovascular accident ([place]) 04/08/2016  . Spastic hemiparesis of left nondominant side ([place]) 04/08/2016  . Cerebral thrombosis with cerebral infarction 04/06/2016  . Basilar artery stenosis   . Dysarthria 04/05/2016  . History of CVA with residual deficit 04/05/2016  . Sinus tachycardia 04/05/2016  . Acute encephalopathy 04/05/2016  . Chronic pain 04/05/2016  . Other hyperlipidemia 04/05/2016  . Ischemic stroke ([place])   . 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 TIA (transient ischemic attack) 11/07/2014  . Uncontrolled hypertension 11/07/2014  .  Controlled type 2 diabetes mellitus with diabetic nephropathy ([place]) 11/07/2014    Cameron Sprang,  PT, MPT [facility] [address] [place], [place], [address] Phone: [phone]   Fax:  [phone] 07/29/16, 3:25 PM  Name: Shelly Mcdaniel MRN: [id number] Date of Birth: [birth year]

## 2016-07-29 NOTE — Patient Instructions (Signed)
Process to put on an over head shirt - Tshirt. (Create an opening for your left arm by opening the shirt from the bottom, and placing this opening between your knees.)  1)  Pick up and place left arm into left arm hole (sleeve)  of shirt.   2)  Pull shirt up above your left elbow. 3)  Place head through neck opening 4) Place right arm into right sleeve.

## 2016-07-29 NOTE — Therapy (Signed)
[facility] [address] [place], [place], [address] Phone: [phone]   Fax:  [phone]  Speech Language Pathology Treatment  Patient Details  Name: Shelly Mcdaniel MRN: [id number] Date of Birth: 11/21/1955 Referring Provider: Alysia Penna, MD  Encounter Date: 07/28/2016      End of Session - 07/28/16 1754    Visit Number 12   Number of Visits 17   Date for SLP Re-Evaluation 08/19/16   SLP Start Time 35   SLP Stop Time  0355   SLP Time Calculation (min) 43 min   Activity Tolerance --      Past Medical History:  Diagnosis Date  . Diabetes mellitus without complication ([place])   . Hyperlipidemia   . Hypertension   . Stroke ([place])   . UTI (urinary tract infection)     Past Surgical History:  Procedure Laterality Date  . BACK SURGERY    . BREAST SURGERY    . COLONOSCOPY WITH PROPOFOL N/A 12/24/2013   Procedure: COLONOSCOPY WITH PROPOFOL;  Surgeon: [name], MD;  [facility];  Service: Endoscopy;  Laterality: N/A;    There were no vitals filed for this visit.       Subjective: S: "Were your kids scared to go to school today?"       ADULT SLP TREATMENT - 07/29/16 1222      General Information   Behavior/Cognition Cooperative;Pleasant mood;Distractible;Decreased sustained attention     Treatment Provided   Treatment provided Cognitive-Linquistic     Cognitive-Linquistic Treatment   Treatment focused on Cognition   Skilled Treatment SLP worked with pt on attention, attention to details (awareness), and emergent awareness by having pt explain photo sequence. SLP had to usually cue pt for attention back to task, after approx 20-40 seconds of work, as pt would just stop working. SLP with max A consistently for error awareness (decr'd emergent awareness). "I don't even enjoy reading anymore, cuz now I just get lost in the story."     Assessment / Recommendations / [place] with current plan of care     Progression Toward Goals   Progression toward goals Not progressing toward goals (comment)            SLP Short Term Goals - 07/19/16 1449      SLP SHORT TERM GOAL #1   Title pt will demo sustained/selective attention in NOVEL therapy tasks in a min-mod noisy environment for 10 minutes, with rare min A back to task   Time 1   Period Weeks   Status Not Met     SLP SHORT TERM GOAL #2   Title pt will demo awareness of errors in novel therapy tasks, 60% of the time, over three sessions   Time 1   Period Weeks   Status Not Met     SLP SHORT TERM GOAL #3   Title pt will demo exercises for incr'd breath support with occasional min A over three sessions   Time 1   Period Weeks   Status Deferred  due to focus on cognition - will keep goal          SLP Long Term Goals - 07/29/16 1225      SLP LONG TERM GOAL #1   Title pt will demo selective attention with simple therapy tasks for 5 minutes with rare min A needed for redirection back to the task over two sessions   Time 3  Period Weeks   Status On-going     SLP LONG TERM GOAL #2   Title pt will exhibit emergent awareness in simple NOVEL cognitive linguistic tasks 80% of the time over two sessions   Time 3   Period Weeks   Status On-going     SLP LONG TERM GOAL #3   Title pt will demonstrate WNL breath support for multiple sentence responses in 5 minute conversation over three sessions    Time 3   Period Weeks   Status On-going          Plan - 07/28/16 1415    Clinical Impression Statement Pt continues to present today with deficits in cognitive-linguistics specifically sustained/selective attention skills and awareness. These deficits continue to affect other areas of cognitive linguistics such as problem solving, reasoning, executive function, and memory. She would beneift from skilled ST focusing on cognitive linguistics and dysarthria to decr caregiver burden and improve  overall communication skills with family and community members.   Speech Therapy Frequency 2x / week   Duration --  8 weeks, or 16 therapy visits   Treatment/Interventions Patient/family education;Compensatory techniques;Internal/external aids;SLP instruction and feedback;Cognitive reorganization;Compensatory strategies;Oral motor exercises;Cueing hierarchy;Functional tasks  any or all may be used   Potential to Achieve Goals Good   Consulted and Agree with Plan of Care Patient;Family member/caregiver   Family Member Consulted husband      Patient will benefit from skilled therapeutic intervention in order to improve the following deficits and impairments:   Cognitive communication deficit  Dysarthria and anarthria    Problem List Patient Active Problem List   Diagnosis Date Noted  . 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 Orthostatic hypotension 04/08/2016  . Muscle cramps 04/08/2016  . Right pontine cerebrovascular accident ([place]) 04/08/2016  . Spastic hemiparesis of left nondominant side ([place]) 04/08/2016  . Cerebral thrombosis with cerebral infarction 04/06/2016  . Basilar artery stenosis   . Dysarthria 04/05/2016  . History of CVA with residual deficit 04/05/2016  . Sinus tachycardia 04/05/2016  . Acute encephalopathy 04/05/2016  . Chronic pain 04/05/2016  . Other hyperlipidemia 04/05/2016  . Ischemic stroke ([place])   . 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 TIA (transient ischemic attack) 11/07/2014  . Uncontrolled hypertension 11/07/2014  . Controlled type 2 diabetes mellitus with diabetic nephropathy ([place]) 11/07/2014    [facility] ,[place], [place]  07/29/2016, 12:27 PM  [facility] [address] [place], [place], [address] Phone: [phone]   Fax:  [phone]   Name: Shelly Mcdaniel MRN: [id number] Date of Birth: 20-Jul-1955

## 2016-08-01 ENCOUNTER — Encounter: Payer: Self-pay | Admitting: Occupational Therapy

## 2016-08-01 ENCOUNTER — Ambulatory Visit: Payer: BC Managed Care – PPO | Admitting: Occupational Therapy

## 2016-08-01 DIAGNOSIS — R41842 Visuospatial deficit: Secondary | ICD-10-CM

## 2016-08-01 DIAGNOSIS — R278 Other lack of coordination: Secondary | ICD-10-CM

## 2016-08-01 DIAGNOSIS — I69354 Hemiplegia and hemiparesis following cerebral infarction affecting left non-dominant side: Secondary | ICD-10-CM | POA: Diagnosis not present

## 2016-08-01 DIAGNOSIS — M6281 Muscle weakness (generalized): Secondary | ICD-10-CM

## 2016-08-01 DIAGNOSIS — M25512 Pain in left shoulder: Secondary | ICD-10-CM

## 2016-08-01 DIAGNOSIS — I69315 Cognitive social or emotional deficit following cerebral infarction: Secondary | ICD-10-CM

## 2016-08-01 DIAGNOSIS — R2681 Unsteadiness on feet: Secondary | ICD-10-CM

## 2016-08-01 DIAGNOSIS — R293 Abnormal posture: Secondary | ICD-10-CM

## 2016-08-01 NOTE — Therapy (Signed)
[facility] [address] [place], [place], [address] Phone: [phone]   Fax:  [phone]  Occupational Therapy Treatment  Patient Details  Name: Shelly Mcdaniel MRN: [id number] Date of Birth: 05-25-1956 Referring Provider: Dr. Claudette LawsAndrew Kirsteins  Encounter Date: 08/01/2016      OT End of Session - 08/01/16 1636    Visit Number 13   Number of Visits 25   Date for OT Re-Evaluation 09/30/16   Authorization Type BC/BS   OT Start Time 1533   OT Stop Time 1617   OT Time Calculation (min) 44 min   Activity Tolerance Patient tolerated treatment well   Behavior [facility] Therapy [facility] for tasks assessed/performed      Past Medical History:  Diagnosis Date  . Diabetes mellitus without complication (HCC)   . Hyperlipidemia   . Hypertension   . Stroke (HCC)   . UTI (urinary tract infection)     Past Surgical History:  Procedure Laterality Date  . BACK SURGERY    . BREAST SURGERY    . COLONOSCOPY WITH PROPOFOL N/A 12/24/2013   Procedure: COLONOSCOPY WITH PROPOFOL;  Surgeon: Charolett BumpersMartin K Johnson, MD;  [facility];  Service: Endoscopy;  Laterality: N/A;    There were no vitals filed for this visit.      Subjective Assessment - 08/01/16 1630    Subjective  Patient indicates that she is looking for respite care for the near future, to allow husband to help daughter locate housing.     Pertinent History see Epic, Dr Roda ShuttersXu has approved DBP less than 110 and HR 115-120 for therapy parameters.    Limitations elevated BP and orthostatic hypotension at times, monitor BP   Patient Stated Goals to be more indpendent   Currently in Pain? No/denies   Pain Score 0-No pain                      OT Treatments/Exercises (OP) - 08/01/16 0001      ADLs   UB Dressing Reviewed upper body dressing.  Patient needing set up and consitent cueing, but is beginning to carryover process for overhead shirt.  Patient needs  orientation for front opening jacket, but with increased time, and intermittent cueing able to don today while seated at edge of mat.       Neurological Re-education Exercises   Other Exercises 1 Patient reports shoulder feels tight- did not exercise much this weekend - daughter in town.  Patient with increased tension in shoulder, however able to gentle provide range of motion, gradually increasing with repetition.  Patient able to abduct/ adduct while supine through ~ 1/4 mid range.  Patient with trace shoulder flexion, extension following guided movement today.                  OT Education - 08/01/16 1636    Education provided Yes   Education Details dressing techniques - upper body   Person(s) Educated Patient   Methods Explanation;Demonstration;Verbal cues;Tactile cues   Comprehension Need further instruction;Tactile cues required;Verbal cues required          OT Short Term Goals - 07/29/16 1549      OT SHORT TERM GOAL #1   Title Pt/family independent with initial HEP for LUE (due 06/29/16)   Status Achieved     OT SHORT TERM GOAL #2   Title Pt/family to verbalize understanding with proper positioning of LUE during activity and in bed to prevent pain including  use of sling /splint prn   Status On-going     OT SHORT TERM GOAL #3   Title Pt to perform UB dressing with min assist using hemi techniques   Status On-going     OT SHORT TERM GOAL #4   Title Pt will donn pants with mod A.   Status Achieved  elastic waist pants     OT SHORT TERM GOAL #5   Title Pt will use LUE as a stabilizer with min A 25 % of the time for ADLS/ functional activity   Status On-going     OT SHORT TERM GOAL #6   Title Pt will perform bathing with min A for UB and mod A for LB   Status On-going           OT Long Term Goals - 07/29/16 1550      OT LONG TERM GOAL #1   Title Independent with updated HEP prn (all LTG's due 07/30/15)   Status On-going     OT LONG TERM GOAL #2    Title Pt to demo active low range sh. flexion to 25 degrees in prep for reaching and use of arm as stabilizer   Status On-going     OT LONG TERM GOAL #3   Title Pt to demo 25% gross finger flexion/extension to grasp/release cylindrical objects   Status On-going     OT LONG TERM GOAL #4   Title Pt will use LUE as a stabilizer for ADLs with min v.c. and pain less than or equal to 3/10   Status On-going     OT LONG TERM GOAL #5   Title Pt will perfrom toilet and shower transfers with min A.   Status Achieved               Plan - 08/01/16 1637    Clinical Impression Statement Patient is showing some carryover of dressing techniques following rote repetition.  Patient with improved sitting balance.     Rehab Potential Fair   OT Frequency 2x / week   OT Duration 8 weeks   OT Treatment/Interventions Self-care/ADL training;Moist Heat;DME and/or AE instruction;Splinting;Patient/family education;Therapeutic exercises;Compression bandaging;Therapeutic activities;Neuromuscular education;Functional Mobility Training;Passive range of motion;Cognitive remediation/compensation;Electrical Stimulation;Manual Therapy;Dry needling;Visual/perceptual remediation/compensation   Plan ADL- sit to stand and static stand in prep for LB dressing   Consulted and Agree with Plan of Care Patient      Patient will benefit from skilled therapeutic intervention in order to improve the following deficits and impairments:  Decreased activity tolerance, Decreased balance, Decreased cognition, Decreased knowledge of use of DME, Decreased coordination, Decreased endurance, Decreased range of motion, Decreased safety awareness, Decreased skin integrity, Decreased strength, Impaired perceived functional ability, Increased edema, Impaired sensation, Impaired tone, Impaired UE functional use, Impaired vision/preception, Pain, Improper body mechanics, Decreased mobility, Abnormal gait, Difficulty walking, Decreased knowledge  of precautions  Visit Diagnosis: Hemiplegia and hemiparesis following cerebral infarction affecting left non-dominant side (HCC)  Abnormal posture  Muscle weakness (generalized)  Unsteadiness on feet  Visuospatial deficit  Other lack of coordination  Acute pain of left shoulder  Cognitive social or emotional deficit following cerebral infarction    Problem List Patient Active Problem List   Diagnosis Date Noted  . 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 Orthostatic hypotension 04/08/2016  . Muscle cramps 04/08/2016  . 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 Cerebral thrombosis with cerebral infarction 04/06/2016  . Basilar  artery stenosis   . Dysarthria 04/05/2016  . History of CVA with residual deficit 04/05/2016  . Sinus tachycardia 04/05/2016  . Acute encephalopathy 04/05/2016  . Chronic pain 04/05/2016  . Other hyperlipidemia 04/05/2016  . Ischemic stroke (HCC)   . 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 TIA (transient ischemic attack) 11/07/2014  . Uncontrolled hypertension 11/07/2014  . Controlled type 2 diabetes mellitus with diabetic nephropathy (HCC) 11/07/2014    Shelly Mcdaniel 08/01/2016, 4:40 PM  [facility] [address] [place], [place], [address] Phone: [phone]   Fax:  [phone]  Name: Shelly Mcdaniel MRN: [id number] Date of Birth: Oct 30, 1955

## 2016-08-02 ENCOUNTER — Ambulatory Visit: Payer: BC Managed Care – PPO | Admitting: Occupational Therapy

## 2016-08-04 ENCOUNTER — Ambulatory Visit: Payer: BC Managed Care – PPO | Admitting: Occupational Therapy

## 2016-08-04 ENCOUNTER — Ambulatory Visit: Payer: BC Managed Care – PPO | Admitting: Speech Pathology

## 2016-08-04 ENCOUNTER — Ambulatory Visit: Payer: BC Managed Care – PPO | Admitting: Rehabilitation

## 2016-08-04 ENCOUNTER — Encounter: Payer: Self-pay | Admitting: Rehabilitation

## 2016-08-04 DIAGNOSIS — R293 Abnormal posture: Secondary | ICD-10-CM

## 2016-08-04 DIAGNOSIS — I69354 Hemiplegia and hemiparesis following cerebral infarction affecting left non-dominant side: Secondary | ICD-10-CM

## 2016-08-04 DIAGNOSIS — M6281 Muscle weakness (generalized): Secondary | ICD-10-CM

## 2016-08-04 DIAGNOSIS — R2689 Other abnormalities of gait and mobility: Secondary | ICD-10-CM

## 2016-08-04 DIAGNOSIS — R278 Other lack of coordination: Secondary | ICD-10-CM

## 2016-08-04 DIAGNOSIS — M25512 Pain in left shoulder: Secondary | ICD-10-CM

## 2016-08-04 DIAGNOSIS — R41842 Visuospatial deficit: Secondary | ICD-10-CM

## 2016-08-04 DIAGNOSIS — R2681 Unsteadiness on feet: Secondary | ICD-10-CM

## 2016-08-04 DIAGNOSIS — M79602 Pain in left arm: Secondary | ICD-10-CM

## 2016-08-04 DIAGNOSIS — R471 Dysarthria and anarthria: Secondary | ICD-10-CM

## 2016-08-04 DIAGNOSIS — R41841 Cognitive communication deficit: Secondary | ICD-10-CM

## 2016-08-04 DIAGNOSIS — I69315 Cognitive social or emotional deficit following cerebral infarction: Secondary | ICD-10-CM

## 2016-08-04 NOTE — Patient Instructions (Signed)
Ankle: Dorsiflexion    Keep right knee straight. Grasp foot, cupping heel in your palm. Gently bring foot toward knee. Do not let knee bend.  Make sure foot stays straight (don't let it curl in) Hold ___60_ seconds. Repeat __2-3__ times. Do _2-3___ sessions per day. CAUTION: Stretch should be gentle, steady and slow.  Copyright  VHI. All rights reserved.   Adductor Stretch - Supine    Lie on floor, knees bent, feet flat. Keeping feet together, lower knees toward floor until stretch felt at inner thighs.  Hold for 1-2 mins (if you need increased stretch, have [name] push lightly on L knee).   Repeat _2-3__ times. Do __2-3_ times per day.  Copyright  VHI. All rights reserved.

## 2016-08-04 NOTE — Therapy (Signed)
[facility] [address] [place], [place], [address] Phone: [phone]   Fax:  [phone]  Speech Language Pathology Treatment  Patient Details  Name: Shelly Mcdaniel MRN: [id number] Date of Birth: 1955/08/13 Referring Provider: Alysia Penna, MD  Encounter Date: 08/04/2016      End of Session - 08/04/16 1449    Visit Number 14   Number of Visits 17   Date for SLP Re-Evaluation 08/19/16   SLP Start Time 1101   SLP Stop Time  1146   SLP Time Calculation (min) 45 min   Activity Tolerance Patient tolerated treatment well      Past Medical History:  Diagnosis Date  . Diabetes mellitus without complication ([place])   . Hyperlipidemia   . Hypertension   . Stroke ([place])   . UTI (urinary tract infection)     Past Surgical History:  Procedure Laterality Date  . BACK SURGERY    . BREAST SURGERY    . COLONOSCOPY WITH PROPOFOL N/A 12/24/2013   Procedure: COLONOSCOPY WITH PROPOFOL;  Surgeon: [name], MD;  Location: [facility];  Service: Endoscopy;  Laterality: N/A;    There were no vitals filed for this visit.             ADULT SLP TREATMENT - 08/04/16 1144      General Information   Behavior/Cognition Cooperative;Pleasant mood;Distractible;Decreased sustained attention   Patient Positioning Upright in chair     Treatment Provided   Treatment provided Cognitive-Linquistic     Pain Assessment   Pain Assessment 0-10   Pain Score 1    Pain Location left shoulder   Pain Descriptors / Indicators Aching   Pain Intervention(s) Monitored during session     Cognitive-Linquistic Treatment   Treatment focused on Cognition;Patient/family/caregiver education   Skilled Treatment SLP discussed treatment plan with patient and her husband. It is the patient's desire to take a one-month break from speech therapy to "give my brain some time to rest. I just keep getting frustrated." Provided education re:  continuing to work on tasks even if challenging or frustrating to improve cognitive skills. Engaged patient in functional task to target attention and emergent awareness: patient wrote a list of tasks of tasks she can do to challenge herself during her therapy break. Provided instruction in compensatory strategies for attention during writing task; patient required moderate cues to verbalize list items prior to writing to improve spelling accuracy. She also required moderate cues to include specific relevant details to her list. SLP intermittently distracted patient from the task, and patient required moderate cues to return to topic and re-focus. 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 Given verbal information regarding meeting times of a local stroke support group, patient recorded information with 100% accuracy and two requests for repetition of specific details (time, location). Given a calendar, she was able to identify the next 3 meeting dates independently.       Assessment / Recommendations / Plan   Plan Continue with current plan of care     Progression Toward Goals   Progression toward goals Progressing toward goals          SLP Education - 08/04/16 1448    Education provided Yes   Education Details stroke  support group, strategies for attention and memory, functional cognitive activities during break from ST   Person(s) Educated Patient;Spouse   Methods Explanation;Verbal cues;Handout   Comprehension Verbalized understanding;Returned demonstration          SLP Short Term Goals - 08/04/16 1548      SLP SHORT TERM GOAL #1   Title pt will demo sustained/selective attention in NOVEL therapy tasks in a min-mod noisy environment for 10 minutes,  with rare min A back to task   Status Not Met     SLP SHORT TERM GOAL #2   Title pt will demo awareness of errors in novel therapy tasks, 60% of the time, over three sessions   Status Not Met     SLP SHORT TERM GOAL #3   Title pt will demo exercises for incr'd breath support with occasional min A over three sessions   Status Deferred          SLP Long Term Goals - 08/04/16 1549      SLP LONG TERM GOAL #1   Title pt will demo selective attention with simple therapy tasks for 5 minutes with rare min A needed for redirection back to the task over two sessions   Time 2   Period Weeks   Status Deferred     SLP LONG TERM GOAL #2   Title pt will exhibit emergent awareness in simple NOVEL cognitive linguistic tasks 80% of the time over two sessions   Time 2   Period Weeks   Status Deferred     SLP LONG TERM GOAL #3   Title pt will demonstrate WNL breath support for multiple sentence responses in 5 minute conversation over three sessions    Time 2   Period Weeks   Status Deferred          Plan - 08/04/16 1450    Clinical Impression Statement Pt continues to distract herself and requires verbal cues to redirect attention to task, even in quiet environment. Pt appears frustrated that she isn't making progress; discussed plan of care with patient and her husband who request a break from [place] so that patient can "go at my own pace at home". Recommended patient return in approximately 1 month to continue ST intervention for education and to provide suggestions and strategies for cognitive exercise at home.   Speech Therapy Frequency Other (comment)  2 x / week following 1 month break   Treatment/Interventions Patient/family education;Compensatory techniques;Internal/external aids;SLP instruction and feedback;Cognitive reorganization;Compensatory strategies;Oral motor exercises;Cueing hierarchy;Functional tasks   Potential to Achieve Goals Good   Potential Considerations Ability to  learn/carryover information;Family/community support;Cooperation/participation level;Previous level of function   Consulted and Agree with Plan of Care Patient;Family member/caregiver   Family Member Consulted husband      Patient will benefit from skilled therapeutic intervention in order to improve the following deficits and impairments:   Cognitive communication deficit  Dysarthria and anarthria    Problem List Patient Active Problem List   Diagnosis Date Noted  . 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 Orthostatic hypotension 04/08/2016  . Muscle cramps 04/08/2016  . Right pontine cerebrovascular accident ([place]) 04/08/2016  . Spastic hemiparesis of left nondominant side ([place]) 04/08/2016  . Cerebral thrombosis with cerebral infarction 04/06/2016  . Basilar artery stenosis   . Dysarthria 04/05/2016  . History of CVA with residual deficit 04/05/2016  . Sinus tachycardia 04/05/2016  . Acute encephalopathy 04/05/2016  . Chronic pain 04/05/2016  . Other hyperlipidemia 04/05/2016  . Ischemic  stroke ([place])   . 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 TIA (transient ischemic attack) 11/07/2014  . Uncontrolled hypertension 11/07/2014  . Controlled type 2 diabetes mellitus with diabetic nephropathy ([place]) 11/07/2014   Deneise Lever, MS CF-SLP Speech-Language Pathologist  Aliene Altes 08/04/2016, 3:53 PM  [facility] [address] [place], [place], [address] Phone: [phone]   Fax:  [phone]   Name: VENTURA LEGGITT MRN: [id number] Date of Birth: May 05, 1956

## 2016-08-04 NOTE — Therapy (Signed)
[facility] [address] [place], [place], [address] Phone: [phone]   Fax:  [phone]  Physical Therapy Treatment  Patient Details  Name: Shelly Mcdaniel MRN: [id number] Date of Birth: May 21, 1956 Referring Provider: Rosalin Hawking, MD and Alysia Penna, MD  Encounter Date: 08/04/2016      PT End of Session - 08/04/16 1214    Visit Number 14   Number of Visits 29   Date for PT Re-Evaluation 09/27/16   Authorization Type [employer] 0 visit limit, 0 auth   PT Start Time 1020   PT Stop Time 1105   PT Time Calculation (min) 45 min   Equipment Utilized During Treatment Gait belt   Activity Tolerance Patient tolerated treatment well   Behavior During Therapy WFL for tasks assessed/performed      Past Medical History:  Diagnosis Date  . Diabetes mellitus without complication ([place])   . Hyperlipidemia   . Hypertension   . Stroke ([place])   . UTI (urinary tract infection)     Past Surgical History:  Procedure Laterality Date  . BACK SURGERY    . BREAST SURGERY    . COLONOSCOPY WITH PROPOFOL N/A 12/24/2013   Procedure: COLONOSCOPY WITH PROPOFOL;  Surgeon: [name], MD;  [facility];  Service: Endoscopy;  Laterality: N/A;    There were no vitals filed for this visit.      Subjective Assessment - 08/04/16 1024    Subjective "I think I've almost recovered from the potty chair incident."    Patient is accompained by: Family member   Limitations House hold activities;Walking   Patient Stated Goals "I want to work on my left extremity."    Currently in Pain? No/denies                         [facility] Adult PT Treatment/Exercise - 08/04/16 0001      Transfers   Transfers Sit to Stand;Stand to Sit   Sit to Stand 4: Min guard;4: Min assist   Sit to Stand Details Verbal cues for sequencing;Verbal cues for technique;Verbal cues for precautions/safety;Tactile cues for weight beaing   Sit to  Stand Details (indicate cue type and reason) Continue to cue for forward weight shift and at times sequencing prior to standing.   Stand to Sit 4: Min guard;With upper extremity assist   Stand to Sit Details (indicate cue type and reason) Verbal cues for sequencing;Verbal cues for technique;Verbal cues for precautions/safety;Tactile cues for weight beaing   Stand to Sit Details cues for controlled sit   Stand Pivot Transfers 4: Min guard  with [facility]   Stand Pivot Transfer Details (indicate cue type and reason) Did at end of session with [facility] with good return demo and recommended they do in this manner at home.      Ambulation/Gait   Ambulation/Gait Yes   Ambulation/Gait Assistance 4: Min assist;3: Mod assist  mod only 2x for LOB   Ambulation/Gait Assistance Details Performed 17' consecutively today with [facility] with PT providing facilitation at rib cage for upright posture and forward lateral weight shift to the L during stance with cues for increased L hip protraction during L stance phase of gait.  Note increased LLE tone today with mild ankle supination and increased adduction. Provided stretches for this, see pt instruction.    Ambulation Distance (Feet) 115 Feet   Assistive device Large base quad cane   Gait Pattern Step-through pattern;Decreased stance time -  left;Decreased hip/knee flexion - left;Decreased dorsiflexion - left;Lateral trunk lean to right;Decreased arm swing - left;Decreased weight shift to left;Narrow base of support;Left circumduction;Left genu recurvatum;Ataxic   Ambulation Surface Level;Indoor   Stairs Yes   Stairs Assistance 3: Mod assist   Stairs Assistance Details (indicate cue type and reason) Performed x 2 reps once with R rail and once with cane only as she states that her and [name] have done this over the weekend to enter family's home.  Pt requires min/mod A with use of rail with assist to fully elevate LLE onto next step and heavy mod A with cane only.  Max cues for  increased LLE activation when advancing RLE to step.  Recommend she have more practice here and will get husband involved prior to doing again.     Stair Management Technique One rail Right;No rails;Step to pattern;Forwards;With cane   Number of Stairs 4  x 2 reps   Height of Stairs 6     Neuro Re-ed    Neuro Re-ed Details  NMR in tall kneeling prior to gait in order to work on improved postural control and L hip protraction (sustained glute med activation).  Tolerated much better today with weight shifting L and R, stepping RLE out to the side and back and ending with transitioning from tall kneeling to R half kneeling.                  PT Education - 08/04/16 1214    Education provided Yes   Education Details stretches due to increased tone in LLE   Person(s) Educated Patient   Methods Explanation;Demonstration;Handout   Comprehension Verbalized understanding          PT Short Term Goals - 07/14/16 1840      PT SHORT TERM GOAL #1   Title Pt will initiate HEP in order to indicate improved functional mobility and decreased fall risk.  (Target Date: 06/30/16)   Baseline 07/13/16: met today as HEP was issued today   Status Achieved     PT SHORT TERM GOAL #2   Title Pt will perform stand pivot transfer w/ LRAD at S level in order to increase independence at home.     Baseline 1/301/18: pt continues to need up to min assist both with and without AD (per previous notes for without AD, used AD today)   Status Not Met     PT SHORT TERM GOAL #3   Title Pt will perform bed mobility at mod I level in order to indicate improved functional independence.     Baseline 07/13/16: pt at supervision level mostly, continues to need assistance to lift left LE onto surface and cues on sequencing/technique placing her at supervision   Status Partially Met     PT SHORT TERM GOAL #4   Title Pt will ambulate 100' w/ LRAD at min A level in order to indicate improved household ambulation.     Time  4   Period Weeks   Status Not Met     PT SHORT TERM GOAL #5   Title Pt will perform PASS and improve score 3 points in order to indicate improved functional mobility.     Time 4   Period Weeks   Status On-going           PT Long Term Goals - 07/29/16 1220      PT LONG TERM GOAL #1   Title Pt/spouse will be independent with HEP in order to indicate  improved functional mobility and decreased fall risk.  (Modified Target Date: 08/28/16)   Baseline met 07/28/16   Time 8   Period Weeks   Status Achieved     PT LONG TERM GOAL #2   Title Pt will perform stand pivot transfers w/ LRAD at S level in order to indicate improved functional independence. 08/28/16   Baseline min A level with L PFRW on 07/28/16   Time 4   Period Weeks   Status Revised     PT LONG TERM GOAL #3   Title Pt will ambulate x 100' w/ LRAD at S level over indoor surfaces in order to increase independence with household ambulation.  08/28/16   Baseline up to 50-80' at a time with L PFRW with min A   Time 4   Period Weeks   Status Revised     PT LONG TERM GOAL #4   Title Pt will perform dynamic standing balance with intermittent UE support x 10 mins at S level in order to increase independence with ADLs. 08/28/16   Baseline revised 07/29/16 to indicate assist level    Time 4   Period Weeks   Status Revised     PT LONG TERM GOAL #5   Title Pt will ambulate over paved outdoor surfaces w/ LRAD x 200' at S level in order to indicate initiation of community ambulation.    Baseline not appropriate at this time   Time 8   Period Weeks   Status Deferred               Plan - 08/04/16 1214    Clinical Impression Statement Skilled session focused on NMR in tall kneeling for improved postural control and LLE sustained activation, gait with LBQC and stairs.  Pt able to ambulate full lap today without sitting.    Rehab Potential Good   Clinical Impairments Affecting Rehab Potential pt motivated by may be limited due  to medical complexity and poor cognition   PT Frequency 2x / week   PT Duration 4 weeks  pending progress, likely only 4 weeks   PT Treatment/Interventions ADLs/Self Care Home Management;Canalith Repostioning;Electrical Stimulation;DME Instruction;Stair training;Gait training;Functional mobility training;Therapeutic activities;Therapeutic exercise;Balance training;Neuromuscular re-education;Patient/family education;Orthotic Fit/Training;Passive range of motion;Energy conservation;Taping;Vestibular;Visual/perceptual remediation/compensation   PT Next Visit Plan Check BP as needed, continue to check compliance with HEP, sit<>stand, transfers, standing balance to decrease pusher tendencies, gait with LBQC, stairs   Consulted and Agree with Plan of Care Patient;Family member/caregiver   Family Member Consulted Husband [name]      Patient will benefit from skilled therapeutic intervention in order to improve the following deficits and impairments:  Abnormal gait, Decreased activity tolerance, Decreased balance, Decreased cognition, Decreased coordination, Decreased endurance, Decreased knowledge of precautions, Decreased knowledge of use of DME, Decreased mobility, Decreased safety awareness, Decreased strength, Impaired perceived functional ability, Impaired flexibility, Impaired sensation, Impaired tone, Impaired UE functional use, Improper body mechanics, Postural dysfunction  Visit Diagnosis: Hemiplegia and hemiparesis following cerebral infarction affecting left non-dominant side ([facility])  Abnormal posture  Muscle weakness (generalized)  Unsteadiness on feet  Other abnormalities of gait and mobility     Problem List Patient Active Problem List   Diagnosis Date Noted  . 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 Orthostatic hypotension 04/08/2016  . Muscle cramps 04/08/2016  . Right pontine cerebrovascular accident ([place]) 04/08/2016  . Spastic hemiparesis of left nondominant side  ([place]) 04/08/2016  . Cerebral thrombosis with cerebral infarction 04/06/2016  . Basilar artery stenosis   .  Dysarthria 04/05/2016  . History of CVA with residual deficit 04/05/2016  . Sinus tachycardia 04/05/2016  . Acute encephalopathy 04/05/2016  . Chronic pain 04/05/2016  . Other hyperlipidemia 04/05/2016  . Ischemic stroke ([place])   . 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 TIA (transient ischemic attack) 11/07/2014  . Uncontrolled hypertension 11/07/2014  . Controlled type 2 diabetes mellitus with diabetic nephropathy ([place]) 11/07/2014    Cameron Sprang, PT, MPT [facility] [address] [place], [place], [address] Phone: [phone]   Fax:  [phone] 08/04/16, 12:17 PM   Name: OLIVETTE BECKMANN MRN: [id number] Date of Birth: Jul 23, 1955

## 2016-08-04 NOTE — Therapy (Signed)
[facility] [address] [place], [place], [address] Phone: [phone]   Fax:  [phone]  Occupational Therapy Treatment  Patient Details  Name: NAJLA AUGHENBAUGH MRN: [id number] Date of Birth: [birth year] Referring Provider: Dr. Claudette Laws  Encounter Date: 08/04/2016      OT End of Session - 08/04/16 1330    Visit Number 14   Number of Visits 25   Date for OT Re-Evaluation 09/30/16   Authorization Type BC/BS   OT Start Time 1149   OT Stop Time 1250   OT Time Calculation (min) 61 min   Activity Tolerance Patient tolerated treatment well   Behavior During Therapy [facility] for tasks assessed/performed      Past Medical History:  Diagnosis Date  . Diabetes mellitus without complication (HCC)   . Hyperlipidemia   . Hypertension   . Stroke (HCC)   . UTI (urinary tract infection)     Past Surgical History:  Procedure Laterality Date  . BACK SURGERY    . BREAST SURGERY    . COLONOSCOPY WITH PROPOFOL N/A 12/24/2013   Procedure: COLONOSCOPY WITH PROPOFOL;  Surgeon: Charolett Bumpers, MD;  Location: [facility];  Service: Endoscopy;  Laterality: N/A;    There were no vitals filed for this visit.      Subjective Assessment - 08/04/16 1208    Subjective  My son can stay with me next weekend.  No need for respite care at this time.                      OT Treatments/Exercises (OP) - 08/04/16 1317      ADLs   ADL Comments Had a long conversation today with patient and husband regarding intention to possibly discontinue OT in near future.  Husband initially very concerned that her arm was not better- and explained that despite much focus- the movement in her left arm currently does not help her functionally.  Discussed that emphasis for remaining time in OT was safety- especially related to functional transfers, and increased independence with ADL.  Husband and patient in agreement.  Explained that if  therapy stopped at end of plan of care - that did not mean she could never return for additional services -as long as there was a need that had potential for improvement.       Neurological Re-education Exercises   Other Exercises 1 Neuromuscular reeducation to address sit to stand safety and balance, and level surface transfers - squat pivot.  Patient with incorrect initiation of this movement, and improper direction of weight shift.  Patient unable to sustain an anterior pelvic tilt to help translate weight toward feet, therefore consistently shifting weight backwards.  Patient having difficulty coordinating the timing of her left leg activation for this task, therefore overcompensating with right leg - throwing her strongly toward her left side.  Worked on alignment and initiaition to improve safety with sit to stand and stand balance as needed for ADL.                 OT Education - 08/04/16 1329    Education provided Yes   Education Details discussion with patient and husband regarding focus for remainder of OT sessions.     Person(s) Educated Patient;Spouse   Methods Explanation   Comprehension Verbalized understanding;Need further instruction          OT Short Term Goals - 07/29/16 1549      OT SHORT TERM  GOAL #1   Title Pt/family independent with initial HEP for LUE (due 06/29/16)   Status Achieved     OT SHORT TERM GOAL #2   Title Pt/family to verbalize understanding with proper positioning of LUE during activity and in bed to prevent pain including use of sling /splint prn   Status On-going     OT SHORT TERM GOAL #3   Title Pt to perform UB dressing with min assist using hemi techniques   Status On-going     OT SHORT TERM GOAL #4   Title Pt will donn pants with mod A.   Status Achieved  elastic waist pants     OT SHORT TERM GOAL #5   Title Pt will use LUE as a stabilizer with min A 25 % of the time for ADLS/ functional activity   Status On-going     OT SHORT  TERM GOAL #6   Title Pt will perform bathing with min A for UB and mod A for LB   Status On-going           OT Long Term Goals - 07/29/16 1550      OT LONG TERM GOAL #1   Title Independent with updated HEP prn (all LTG's due 07/30/15)   Status On-going     OT LONG TERM GOAL #2   Title Pt to demo active low range sh. flexion to 25 degrees in prep for reaching and use of arm as stabilizer   Status On-going     OT LONG TERM GOAL #3   Title Pt to demo 25% gross finger flexion/extension to grasp/release cylindrical objects   Status On-going     OT LONG TERM GOAL #4   Title Pt will use LUE as a stabilizer for ADLs with min v.c. and pain less than or equal to 3/10   Status On-going     OT LONG TERM GOAL #5   Title Pt will perfrom toilet and shower transfers with min A.   Status Achieved               Plan - 08/04/16 1331    Clinical Impression Statement Patient is showing some improvement with safe level surface transfers, and sit to stand transitions.     Rehab Potential Fair   OT Frequency 2x / week   OT Duration 8 weeks   OT Treatment/Interventions Self-care/ADL training;Moist Heat;DME and/or AE instruction;Splinting;Patient/family education;Therapeutic exercises;Compression bandaging;Therapeutic activities;Neuromuscular education;Functional Mobility Training;Passive range of motion;Cognitive remediation/compensation;Electrical Stimulation;Manual Therapy;Dry needling;Visual/perceptual remediation/compensation   Plan ADL- Toilet transfers simulate home situation    Consulted and Agree with Plan of Care Patient   Family Member Consulted Husband - [name]      Patient will benefit from skilled therapeutic intervention in order to improve the following deficits and impairments:  Decreased activity tolerance, Decreased balance, Decreased cognition, Decreased knowledge of use of DME, Decreased coordination, Decreased endurance, Decreased range of motion, Decreased safety  awareness, Decreased skin integrity, Decreased strength, Impaired perceived functional ability, Increased edema, Impaired sensation, Impaired tone, Impaired UE functional use, Impaired vision/preception, Pain, Improper body mechanics, Decreased mobility, Abnormal gait, Difficulty walking, Decreased knowledge of precautions  Visit Diagnosis: Hemiplegia and hemiparesis following cerebral infarction affecting left non-dominant side (HCC)  Abnormal posture  Muscle weakness (generalized)  Unsteadiness on feet  Visuospatial deficit  Other lack of coordination  Acute pain of left shoulder  Cognitive social or emotional deficit following cerebral infarction  Pain in left arm    Problem List Patient Active  Problem List   Diagnosis Date Noted  . 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 Orthostatic hypotension 04/08/2016  . Muscle cramps 04/08/2016  . 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 Cerebral thrombosis with cerebral infarction 04/06/2016  . Basilar artery stenosis   . Dysarthria 04/05/2016  . History of CVA with residual deficit 04/05/2016  . Sinus tachycardia 04/05/2016  . Acute encephalopathy 04/05/2016  . Chronic pain 04/05/2016  . Other hyperlipidemia 04/05/2016  . Ischemic stroke (HCC)   . 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 TIA (transient ischemic attack) 11/07/2014  . Uncontrolled hypertension 11/07/2014  . Controlled type 2 diabetes mellitus with diabetic nephropathy (HCC) 11/07/2014    Collier Salina, OTR/L 08/04/2016, 1:35 PM  [facility] [address] [place], [place], [address] Phone: [phone]   Fax:  [phone]  Name: SHANETTE TAMARGO MRN: [id number] Date of Birth: 1955-11-05

## 2016-08-05 ENCOUNTER — Ambulatory Visit: Payer: BC Managed Care – PPO

## 2016-08-05 ENCOUNTER — Other Ambulatory Visit: Payer: Self-pay | Admitting: Geriatric Medicine

## 2016-08-05 ENCOUNTER — Ambulatory Visit: Payer: BC Managed Care – PPO | Admitting: Rehabilitation

## 2016-08-05 DIAGNOSIS — Z1231 Encounter for screening mammogram for malignant neoplasm of breast: Secondary | ICD-10-CM

## 2016-08-08 ENCOUNTER — Encounter: Payer: Self-pay | Admitting: Rehabilitation

## 2016-08-08 ENCOUNTER — Encounter: Payer: BC Managed Care – PPO | Admitting: Speech Pathology

## 2016-08-08 ENCOUNTER — Ambulatory Visit: Payer: BC Managed Care – PPO | Admitting: Rehabilitation

## 2016-08-08 DIAGNOSIS — I69354 Hemiplegia and hemiparesis following cerebral infarction affecting left non-dominant side: Secondary | ICD-10-CM | POA: Diagnosis not present

## 2016-08-08 DIAGNOSIS — R2681 Unsteadiness on feet: Secondary | ICD-10-CM

## 2016-08-08 DIAGNOSIS — R293 Abnormal posture: Secondary | ICD-10-CM

## 2016-08-08 DIAGNOSIS — M6281 Muscle weakness (generalized): Secondary | ICD-10-CM

## 2016-08-08 NOTE — Therapy (Signed)
[facility] [address] [place], [place], [address] Phone: [phone]   Fax:  [phone]  Physical Therapy Treatment  Patient Details  Name: Shelly Mcdaniel MRN: [id number] Date of Birth: 12-07-1955 Referring Provider: Rosalin Hawking, MD and Alysia Penna, MD  Encounter Date: 08/08/2016      PT End of Session - 08/08/16 2013    Visit Number 15   Number of Visits 29   Date for PT Re-Evaluation 09/27/16   Authorization Type BCBS 0 visit limit, 0 auth   PT Start Time 1321  pt late to session   PT Stop Time 1400   PT Time Calculation (min) 39 min   Equipment Utilized During Treatment Gait belt   Activity Tolerance Patient tolerated treatment well   Behavior During Therapy WFL for tasks assessed/performed      Past Medical History:  Diagnosis Date  . Diabetes mellitus without complication ([place])   . Hyperlipidemia   . Hypertension   . Stroke ([place])   . UTI (urinary tract infection)     Past Surgical History:  Procedure Laterality Date  . BACK SURGERY    . BREAST SURGERY    . COLONOSCOPY WITH PROPOFOL N/A 12/24/2013   Procedure: COLONOSCOPY WITH PROPOFOL;  Surgeon: [name], MD;  Location: [facility];  Service: Endoscopy;  Laterality: N/A;    There were no vitals filed for this visit.      Subjective Assessment - 08/08/16 1323    Subjective "I got into the tub this morning."    Patient is accompained by: Family member   Limitations House hold activities;Walking   Patient Stated Goals "I want to work on my left extremity."    Currently in Pain? No/denies            NMR:  Skilled session focused on NMR with use of mirror for increased visual feedback on improved postural control, improved grading of movement, increased LLE activation-esp sustained activation, and endurance.  Performed sit<>stand with emphasis on improved midline orientation for equal LE WB with min A to min/guard with  continued cues for forward weight shift and to increase downward push into ground for full upright position.  Once in standing, worked on decreasing tactile feedback to allow pt to work somewhat on her own to correct balance and posture.  Note she continues to demonstrate posterior LOB, but with continued facilitation and cues is able to stand unsupported up to 10-15 seconds.  Worked in squat position moving R and L retrieving and placing objects to increase sustained activation of LLE.  Requires heavy mod to max A during task due to poor LLE activation and perceptual and sensory deficits.  Pt with increased fatigue, but able to continued following multiple seated rest breaks.                       PT Education - 08/08/16 2012    Education provided Yes   Education Details safety in the home and not attempting to assist pt into and out of regular bath tub.    Person(s) Educated Patient;Spouse   Methods Explanation   Comprehension Verbalized understanding          PT Short Term Goals - 07/14/16 1840      PT SHORT TERM GOAL #1   Title Pt will initiate HEP in order to indicate improved functional mobility and decreased fall risk.  (Target Date: 06/30/16)   Baseline 07/13/16: met today as  HEP was issued today   Status Achieved     PT SHORT TERM GOAL #2   Title Pt will perform stand pivot transfer w/ LRAD at S level in order to increase independence at home.     Baseline 1/301/18: pt continues to need up to min assist both with and without AD (per previous notes for without AD, used AD today)   Status Not Met     PT SHORT TERM GOAL #3   Title Pt will perform bed mobility at mod I level in order to indicate improved functional independence.     Baseline 07/13/16: pt at supervision level mostly, continues to need assistance to lift left LE onto surface and cues on sequencing/technique placing her at supervision   Status Partially Met     PT SHORT TERM GOAL #4   Title Pt will  ambulate 100' w/ LRAD at min A level in order to indicate improved household ambulation.     Time 4   Period Weeks   Status Not Met     PT SHORT TERM GOAL #5   Title Pt will perform PASS and improve score 3 points in order to indicate improved functional mobility.     Time 4   Period Weeks   Status On-going           PT Long Term Goals - 07/29/16 1220      PT LONG TERM GOAL #1   Title Pt/spouse will be independent with HEP in order to indicate improved functional mobility and decreased fall risk.  (Modified Target Date: 08/28/16)   Baseline met 07/28/16   Time 8   Period Weeks   Status Achieved     PT LONG TERM GOAL #2   Title Pt will perform stand pivot transfers w/ LRAD at S level in order to indicate improved functional independence. 08/28/16   Baseline min A level with L PFRW on 07/28/16   Time 4   Period Weeks   Status Revised     PT LONG TERM GOAL #3   Title Pt will ambulate x 100' w/ LRAD at S level over indoor surfaces in order to increase independence with household ambulation.  08/28/16   Baseline up to 50-80' at a time with L PFRW with min A   Time 4   Period Weeks   Status Revised     PT LONG TERM GOAL #4   Title Pt will perform dynamic standing balance with intermittent UE support x 10 mins at S level in order to increase independence with ADLs. 08/28/16   Baseline revised 07/29/16 to indicate assist level    Time 4   Period Weeks   Status Revised     PT LONG TERM GOAL #5   Title Pt will ambulate over paved outdoor surfaces w/ LRAD x 200' at S level in order to indicate initiation of community ambulation.    Baseline not appropriate at this time   Time 8   Period Weeks   Status Deferred               Plan - 08/08/16 2013    Clinical Impression Statement Skilled session continues to focus on NMR for improved postural control/alignment, and improved LLE activation.  Pt continues to be limited due to deecreased sensation, poor attention, and decreased  carryover.     Rehab Potential Good   Clinical Impairments Affecting Rehab Potential pt motivated by may be limited due to medical complexity and poor cognition  PT Frequency 2x / week   PT Duration 4 weeks  pending progress, likely only 4 weeks   PT Treatment/Interventions ADLs/Self Care Home Management;Canalith Repostioning;Electrical Stimulation;DME Instruction;Stair training;Gait training;Functional mobility training;Therapeutic activities;Therapeutic exercise;Balance training;Neuromuscular re-education;Patient/family education;Orthotic Fit/Training;Passive range of motion;Energy conservation;Taping;Vestibular;Visual/perceptual remediation/compensation   PT Next Visit Plan Check BP as needed, continue to check compliance with HEP, sit<>stand, transfers, standing balance to decrease pusher tendencies, gait with LBQC, stairs   Consulted and Agree with Plan of Care Patient;Family member/caregiver   Family Member Consulted Husband [name]      Patient will benefit from skilled therapeutic intervention in order to improve the following deficits and impairments:  Abnormal gait, Decreased activity tolerance, Decreased balance, Decreased cognition, Decreased coordination, Decreased endurance, Decreased knowledge of precautions, Decreased knowledge of use of DME, Decreased mobility, Decreased safety awareness, Decreased strength, Impaired perceived functional ability, Impaired flexibility, Impaired sensation, Impaired tone, Impaired UE functional use, Improper body mechanics, Postural dysfunction  Visit Diagnosis: Hemiplegia and hemiparesis following cerebral infarction affecting left non-dominant side (HCC)  Abnormal posture  Muscle weakness (generalized)  Unsteadiness on feet     Problem List Patient Active Problem List   Diagnosis Date Noted  . 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 Orthostatic hypotension 04/08/2016  . Muscle cramps 04/08/2016  . Right pontine cerebrovascular  accident ([place]) 04/08/2016  . Spastic hemiparesis of left nondominant side ([place]) 04/08/2016  . Cerebral thrombosis with cerebral infarction 04/06/2016  . Basilar artery stenosis   . Dysarthria 04/05/2016  . History of CVA with residual deficit 04/05/2016  . Sinus tachycardia 04/05/2016  . Acute encephalopathy 04/05/2016  . Chronic pain 04/05/2016  . Other hyperlipidemia 04/05/2016  . Ischemic stroke ([place])   . 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 TIA (transient ischemic attack) 11/07/2014  . Uncontrolled hypertension 11/07/2014  . Controlled type 2 diabetes mellitus with diabetic nephropathy ([place]) 11/07/2014    Cameron Sprang, PT, MPT [facility] [address] [place], [place], [address] Phone: [phone]   Fax:  [phone] 08/08/16, 8:21 PM  Name: Shelly Mcdaniel MRN: [id number] Date of Birth: 05-27-1956

## 2016-08-09 ENCOUNTER — Encounter: Payer: BC Managed Care – PPO | Admitting: Occupational Therapy

## 2016-08-10 ENCOUNTER — Encounter: Payer: Self-pay | Admitting: Occupational Therapy

## 2016-08-10 ENCOUNTER — Ambulatory Visit: Payer: BC Managed Care – PPO | Admitting: Physical Therapy

## 2016-08-10 ENCOUNTER — Ambulatory Visit: Payer: BC Managed Care – PPO | Admitting: Occupational Therapy

## 2016-08-10 DIAGNOSIS — R293 Abnormal posture: Secondary | ICD-10-CM

## 2016-08-10 DIAGNOSIS — M6281 Muscle weakness (generalized): Secondary | ICD-10-CM

## 2016-08-10 DIAGNOSIS — I69354 Hemiplegia and hemiparesis following cerebral infarction affecting left non-dominant side: Secondary | ICD-10-CM

## 2016-08-10 DIAGNOSIS — R278 Other lack of coordination: Secondary | ICD-10-CM

## 2016-08-10 DIAGNOSIS — M25512 Pain in left shoulder: Secondary | ICD-10-CM

## 2016-08-10 DIAGNOSIS — R41842 Visuospatial deficit: Secondary | ICD-10-CM

## 2016-08-10 DIAGNOSIS — R2681 Unsteadiness on feet: Secondary | ICD-10-CM

## 2016-08-10 DIAGNOSIS — I69315 Cognitive social or emotional deficit following cerebral infarction: Secondary | ICD-10-CM

## 2016-08-10 NOTE — Therapy (Signed)
[facility] [address] [place], [place], [address] Phone: [phone]   Fax:  [phone]  Occupational Therapy Treatment  Patient Details  Name: Shelly SimmerJody D Mcdaniel MRN: [id number] Date of Birth: [birth year] Referring Provider: Dr. Claudette LawsAndrew Kirsteins  Encounter Date: 08/10/2016      OT End of Session - 08/10/16 1702    Visit Number 15   Number of Visits 25   Date for OT Re-Evaluation 09/30/16   Authorization Type BC/BS   OT Start Time 1536   OT Stop Time 1630   OT Time Calculation (min) 54 min   Activity Tolerance Patient tolerated treatment well   Behavior [facility] Therapy [facility] tasks assessed/performed      Past Medical History:  Diagnosis Date  . Diabetes mellitus without complication (HCC)   . Hyperlipidemia   . Hypertension   . Stroke (HCC)   . UTI (urinary tract infection)     Past Surgical History:  Procedure Laterality Date  . BACK SURGERY    . BREAST SURGERY    . COLONOSCOPY WITH PROPOFOL N/A 12/24/2013   Procedure: COLONOSCOPY WITH PROPOFOL;  Surgeon: Charolett BumpersMartin K Johnson, MD;  Location: [facility];  Service: Endoscopy;  Laterality: N/A;    There were no vitals filed for this visit.      Subjective Assessment - 08/10/16 1548    Subjective  I saw the urologist and he thinks my bladder infections are due to my estrogen level   Patient is accompained by: Family member   Pertinent History see Epic, Dr Roda ShuttersXu has approved DBP less than 110 and HR 115-120 for therapy parameters.    Patient Stated Goals to be more indpendent   Currently in Pain? Yes   Pain Score 1    Pain Location Arm   Pain Orientation Left   Pain Descriptors / Indicators Aching   Pain Type Acute pain   Pain Onset Today   Aggravating Factors  positioning   Pain Relieving Factors support                      OT Treatments/Exercises (OP) - 08/10/16 0001      ADLs   Toileting Reviewed bed to commode transfers  simulating patient's home environment.  PAtient's husband present to confirm height of bed, and room set up.  Working to make transfers to/from bed as safe and independnet as possible.  Patient does safest transition with squat pivot (vs stand pivot) transfer.  Patient today able to transfer toward right with only verbal cueing for set up and technique, patient needed min assist at times for transfers toward weaker left side.  a                OT Education - 08/10/16 1702    Education provided Yes   Education Details safety with bed to commode transfers   Person(s) Educated Patient;Spouse   Methods Explanation;Demonstration;Tactile cues;Verbal cues   Comprehension Verbal cues required;Tactile cues required;Need further instruction          OT Short Term Goals - 08/10/16 1704      OT SHORT TERM GOAL #1   Title Pt/family independent with initial HEP for LUE (due 06/29/16)   Status Achieved     OT SHORT TERM GOAL #2   Title Pt/family to verbalize understanding with proper positioning of LUE during activity and in bed to prevent pain including use of sling /splint prn   Status Achieved  OT SHORT TERM GOAL #3   Title Pt to perform UB dressing with min assist using hemi techniques   Status On-going     OT SHORT TERM GOAL #4   Title Pt will donn pants with mod A.   Status Achieved     OT SHORT TERM GOAL #5   Title Pt will use LUE as a stabilizer with min A 25 % of the time for ADLS/ functional activity   Status On-going           OT Long Term Goals - 08/10/16 1704      OT LONG TERM GOAL #1   Title Independent with updated HEP prn (all LTG's due 07/30/15)   Status On-going     OT LONG TERM GOAL #2   Title Pt to demo active low range sh. flexion to 25 degrees in prep for reaching and use of arm as stabilizer   Status On-going     OT LONG TERM GOAL #3   Title Pt to demo 25% gross finger flexion/extension to grasp/release cylindrical objects   Status On-going      OT LONG TERM GOAL #4   Title Pt will use LUE as a stabilizer for ADLs with min v.c. and pain less than or equal to 3/10   Status On-going     OT LONG TERM GOAL #5   Title Pt will perfrom toilet and shower transfers with min A.   Status Achieved               Plan - 08/10/16 1703    Clinical Impression Statement Patient is showing improvement with functional mobility and simple ADL particiaptieon with rote repetition.     Rehab Potential Fair   OT Frequency 2x / week   OT Duration 8 weeks   OT Treatment/Interventions Self-care/ADL training;Moist Heat;DME and/or AE instruction;Splinting;Patient/family education;Therapeutic exercises;Compression bandaging;Therapeutic activities;Neuromuscular education;Functional Mobility Training;Passive range of motion;Cognitive remediation/compensation;Electrical Stimulation;Manual Therapy;Dry needling;Visual/perceptual remediation/compensation   Plan Rolling toward right and transtioning supine to sit (sleeps at 30 degree incline.     Consulted and Agree with Plan of Care Patient;Family member/caregiver   Family Member Consulted Husband - [name]      Patient will benefit from skilled therapeutic intervention in order to improve the following deficits and impairments:  Decreased activity tolerance, Decreased balance, Decreased cognition, Decreased knowledge of use of DME, Decreased coordination, Decreased endurance, Decreased range of motion, Decreased safety awareness, Decreased skin integrity, Decreased strength, Impaired perceived functional ability, Increased edema, Impaired sensation, Impaired tone, Impaired UE functional use, Impaired vision/preception, Pain, Improper body mechanics, Decreased mobility, Abnormal gait, Difficulty walking, Decreased knowledge of precautions  Visit Diagnosis: Hemiplegia and hemiparesis following cerebral infarction affecting left non-dominant side (HCC)  Abnormal posture  Muscle weakness  (generalized)  Unsteadiness on feet  Visuospatial deficit  Other lack of coordination  Acute pain of left shoulder  Cognitive social or emotional deficit following cerebral infarction    Problem List Patient Active Problem List   Diagnosis Date Noted  . 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 Orthostatic hypotension 04/08/2016  . Muscle cramps 04/08/2016  . 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 Cerebral thrombosis with cerebral infarction 04/06/2016  . Basilar artery stenosis   . Dysarthria 04/05/2016  . History of CVA with residual deficit 04/05/2016  . Sinus tachycardia 04/05/2016  . Acute encephalopathy 04/05/2016  . Chronic pain 04/05/2016  . Other hyperlipidemia 04/05/2016  .  Ischemic stroke (HCC)   . 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 TIA (transient ischemic attack) 11/07/2014  . Uncontrolled hypertension 11/07/2014  . Controlled type 2 diabetes mellitus with diabetic nephropathy (HCC) 11/07/2014    Collier Salina, OTR/L 08/10/2016, 5:06 PM  [facility] [address] [place], [place], [address] Phone: [phone]   Fax:  [phone]  Name: MIQUEL LAMSON MRN: [id number] Date of Birth: 1955/12/26

## 2016-08-11 ENCOUNTER — Encounter: Payer: Self-pay | Admitting: Physical Therapy

## 2016-08-11 ENCOUNTER — Ambulatory Visit: Payer: BC Managed Care – PPO | Admitting: Occupational Therapy

## 2016-08-11 ENCOUNTER — Ambulatory Visit: Payer: BC Managed Care – PPO | Attending: Physical Medicine and Rehabilitation | Admitting: Physical Therapy

## 2016-08-11 ENCOUNTER — Encounter: Payer: Self-pay | Admitting: Occupational Therapy

## 2016-08-11 DIAGNOSIS — M25512 Pain in left shoulder: Secondary | ICD-10-CM

## 2016-08-11 DIAGNOSIS — R278 Other lack of coordination: Secondary | ICD-10-CM | POA: Insufficient documentation

## 2016-08-11 DIAGNOSIS — R2681 Unsteadiness on feet: Secondary | ICD-10-CM

## 2016-08-11 DIAGNOSIS — R2689 Other abnormalities of gait and mobility: Secondary | ICD-10-CM | POA: Insufficient documentation

## 2016-08-11 DIAGNOSIS — R41842 Visuospatial deficit: Secondary | ICD-10-CM | POA: Diagnosis present

## 2016-08-11 DIAGNOSIS — I69354 Hemiplegia and hemiparesis following cerebral infarction affecting left non-dominant side: Secondary | ICD-10-CM

## 2016-08-11 DIAGNOSIS — M6281 Muscle weakness (generalized): Secondary | ICD-10-CM

## 2016-08-11 DIAGNOSIS — R293 Abnormal posture: Secondary | ICD-10-CM

## 2016-08-11 DIAGNOSIS — I69315 Cognitive social or emotional deficit following cerebral infarction: Secondary | ICD-10-CM | POA: Insufficient documentation

## 2016-08-11 NOTE — Therapy (Signed)
[facility] [address] [place], [place], [address] Phone: [phone]   Fax:  [phone]  Physical Therapy Treatment  Patient Details  Name: Shelly Mcdaniel MRN: [id number] Date of Birth: 1955/06/28 Referring Provider: Rosalin Hawking, MD and Alysia Penna, MD  Encounter Date: 08/11/2016      PT End of Session - 08/11/16 2053    Visit Number 16   Number of Visits 29   Date for PT Re-Evaluation 09/27/16   Authorization Type BCBS 0 visit limit, 0 auth   PT Start Time 6811   PT Stop Time 1400   PT Time Calculation (min) 43 min   Activity Tolerance Patient tolerated treatment well   Behavior During Therapy [facility] for tasks assessed/performed      Past Medical History:  Diagnosis Date  . Diabetes mellitus without complication ([place])   . Hyperlipidemia   . Hypertension   . Stroke ([place])   . UTI (urinary tract infection)     Past Surgical History:  Procedure Laterality Date  . BACK SURGERY    . BREAST SURGERY    . COLONOSCOPY WITH PROPOFOL N/A 12/24/2013   Procedure: COLONOSCOPY WITH PROPOFOL;  Surgeon: [name], MD;  Location: [facility];  Service: Endoscopy;  Laterality: N/A;    There were no vitals filed for this visit.      Subjective Assessment - 08/11/16 1323    Subjective Feeling sore from all the "thigh" activities yesterday!   Patient is accompained by: Family member   Limitations House hold activities;Walking   Patient Stated Goals "I want to work on my left extremity."    Currently in Pain? Yes   Pain Score 1    Pain Location Arm   Pain Orientation Left   Pain Descriptors / Indicators Aching   Pain Type Acute pain           [facility] Adult PT Treatment/Exercise - 08/11/16 1340      Transfers   Stand Pivot Transfers 4: Min IT sales professional Details (indicate cue type and reason) w/c > mat with quad cane with assistance on L side for weight shifting and to prevent LOB to L.   Performed stand pivot training with pt performing pivot in 360 deg to L and R with feet and cane within hoola hoop to facilitate pivoting.  Required min-mod A for weight shifting, stance control and placement of LLE.  After first repetition pt reported vertigo and nausea.  Returned to mat and pt allowed to lie down to let symptoms settle.       Neuro Re-ed    Neuro Re-ed Details  NMR in sitting and standing with RUE support on quad cane with focus on controlled L lateral weight shifting through trunk and pelvis and maintaining WB through LLE during RLE taps to ball and carryover to stand pivot transfers.  Required mod A on L to prevent posterior LOB and to minimize use of head movements to initiate weight shift.     Knee/Hip Exercises: Sidelying   Hip ABduction Left;Strengthening;1 set;10 reps   Hip ABduction Limitations with hip flexion<>extension and verbal/tactile cues for glute activation and to prevent substitution of hip flexor.            PT Education - 08/11/16 2053    Education provided Yes   Education Details stand pivot with quad cane   Person(s) Educated Patient   Methods Explanation   Comprehension Need further instruction  PT Short Term Goals - 07/14/16 1840      PT SHORT TERM GOAL #1   Title Pt will initiate HEP in order to indicate improved functional mobility and decreased fall risk.  (Target Date: 06/30/16)   Baseline 07/13/16: met today as HEP was issued today   Status Achieved     PT SHORT TERM GOAL #2   Title Pt will perform stand pivot transfer w/ LRAD at S level in order to increase independence at home.     Baseline 1/301/18: pt continues to need up to min assist both with and without AD (per previous notes for without AD, used AD today)   Status Not Met     PT SHORT TERM GOAL #3   Title Pt will perform bed mobility at mod I level in order to indicate improved functional independence.     Baseline 07/13/16: pt at supervision level mostly, continues  to need assistance to lift left LE onto surface and cues on sequencing/technique placing her at supervision   Status Partially Met     PT SHORT TERM GOAL #4   Title Pt will ambulate 100' w/ LRAD at min A level in order to indicate improved household ambulation.     Time 4   Period Weeks   Status Not Met     PT SHORT TERM GOAL #5   Title Pt will perform PASS and improve score 3 points in order to indicate improved functional mobility.     Time 4   Period Weeks   Status On-going           PT Long Term Goals - 07/29/16 1220      PT LONG TERM GOAL #1   Title Pt/spouse will be independent with HEP in order to indicate improved functional mobility and decreased fall risk.  (Modified Target Date: 08/28/16)   Baseline met 07/28/16   Time 8   Period Weeks   Status Achieved     PT LONG TERM GOAL #2   Title Pt will perform stand pivot transfers w/ LRAD at S level in order to indicate improved functional independence. 08/28/16   Baseline min A level with L PFRW on 07/28/16   Time 4   Period Weeks   Status Revised     PT LONG TERM GOAL #3   Title Pt will ambulate x 100' w/ LRAD at S level over indoor surfaces in order to increase independence with household ambulation.  08/28/16   Baseline up to 50-80' at a time with L PFRW with min A   Time 4   Period Weeks   Status Revised     PT LONG TERM GOAL #4   Title Pt will perform dynamic standing balance with intermittent UE support x 10 mins at S level in order to increase independence with ADLs. 08/28/16   Baseline revised 07/29/16 to indicate assist level    Time 4   Period Weeks   Status Revised     PT LONG TERM GOAL #5   Title Pt will ambulate over paved outdoor surfaces w/ LRAD x 200' at S level in order to indicate initiation of community ambulation.    Baseline not appropriate at this time   Time 8   Period Weeks   Status Deferred               Plan - 08/11/16 2054    Clinical Impression Statement Treatment session  today with continued focus on NMR for postural control,  weight shifting, improved activation of proximal stabilization mm for balance in standing.  Pt required max verbal and tactile cues for carryover to functional transfer task.  Will continue to address.   Rehab Potential Good   Clinical Impairments Affecting Rehab Potential pt motivated by may be limited due to medical complexity and poor cognition   PT Frequency 2x / week   PT Duration 4 weeks   PT Treatment/Interventions ADLs/Self Care Home Management;Canalith Repostioning;Electrical Stimulation;DME Instruction;Stair training;Gait training;Functional mobility training;Therapeutic activities;Therapeutic exercise;Balance training;Neuromuscular re-education;Patient/family education;Orthotic Fit/Training;Passive range of motion;Energy conservation;Taping;Vestibular;Visual/perceptual remediation/compensation   PT Next Visit Plan Check BP as needed, continue to check compliance with HEP, sit<>stand, transfers, standing balance to decrease pusher tendencies, gait with LBQC, stairs   Consulted and Agree with Plan of Care Patient      Patient will benefit from skilled therapeutic intervention in order to improve the following deficits and impairments:  Abnormal gait, Decreased activity tolerance, Decreased balance, Decreased cognition, Decreased coordination, Decreased endurance, Decreased knowledge of precautions, Decreased knowledge of use of DME, Decreased mobility, Decreased safety awareness, Decreased strength, Impaired perceived functional ability, Impaired flexibility, Impaired sensation, Impaired tone, Impaired UE functional use, Improper body mechanics, Postural dysfunction  Visit Diagnosis: Hemiplegia and hemiparesis following cerebral infarction affecting left non-dominant side (HCC)  Abnormal posture  Muscle weakness (generalized)  Unsteadiness on feet  Other abnormalities of gait and mobility     Problem List Patient Active  Problem List   Diagnosis Date Noted  . 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 Orthostatic hypotension 04/08/2016  . Muscle cramps 04/08/2016  . Right pontine cerebrovascular accident ([place]) 04/08/2016  . Spastic hemiparesis of left nondominant side ([place]) 04/08/2016  . Cerebral thrombosis with cerebral infarction 04/06/2016  . Basilar artery stenosis   . Dysarthria 04/05/2016  . History of CVA with residual deficit 04/05/2016  . Sinus tachycardia 04/05/2016  . Acute encephalopathy 04/05/2016  . Chronic pain 04/05/2016  . Other hyperlipidemia 04/05/2016  . Ischemic stroke ([place])   . 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 TIA (transient ischemic attack) 11/07/2014  . Uncontrolled hypertension 11/07/2014  . Controlled type 2 diabetes mellitus with diabetic nephropathy ([place]) 11/07/2014   Raylene Everts, PT, DPT 08/11/16    9:01 PM    [facility] [address] [place], [place], [address] Phone: [phone]   Fax:  [phone]  Name: Shelly Mcdaniel MRN: [id number] Date of Birth: 1955-07-29

## 2016-08-12 NOTE — Therapy (Signed)
[facility] [address] [place], [place], [address] Phone: [phone]   Fax:  [phone]  Occupational Therapy Treatment  Patient Details  Name: Shelly Mcdaniel MRN: [id number] Date of Birth: 31-May-1956 Referring Provider: Dr. Claudette LawsAndrew Kirsteins  Encounter Date: 08/11/2016      OT End of Session - 08/12/16 1105    Visit Number 16   Number of Visits 25   Date for OT Re-Evaluation 09/30/16   Authorization Type BC/BS   OT Start Time 1400   OT Stop Time 1445   OT Time Calculation (min) 45 min   Activity Tolerance Patient tolerated treatment well   Behavior [facility] Therapy [facility] for tasks assessed/performed      Past Medical History:  Diagnosis Date  . Diabetes mellitus without complication (HCC)   . Hyperlipidemia   . Hypertension   . Stroke (HCC)   . UTI (urinary tract infection)     Past Surgical History:  Procedure Laterality Date  . BACK SURGERY    . BREAST SURGERY    . COLONOSCOPY WITH PROPOFOL N/A 12/24/2013   Procedure: COLONOSCOPY WITH PROPOFOL;  Surgeon: Charolett BumpersMartin K Johnson, MD;  Location: [facility];  Service: Endoscopy;  Laterality: N/A;    There were no vitals filed for this visit.      Subjective Assessment - 08/11/16 1424    Subjective  [name] raised the commode and it was easier.  He took away the platform.     Pertinent History see Epic, Dr Roda ShuttersXu has approved DBP less than 110 and HR 115-120 for therapy parameters.    Limitations elevated BP and orthostatic hypotension at times, monitor BP   Patient Stated Goals to be more indpendent   Currently in Pain? Yes   Pain Score 1    Pain Location Arm   Pain Orientation Left   Pain Descriptors / Indicators Aching   Pain Type Acute pain   Pain Onset Today   Pain Frequency Intermittent   Aggravating Factors  positioning   Pain Relieving Factors support                      OT Treatments/Exercises (OP) - 08/12/16 0001      ADLs    Toileting Practiced bed mobility, supine to sit/ sit to supine, and transfer to bedside commode in simulated environment.  Patient with repetition able to transition from sit to supine and back with only verbal cueing.  Patient needs cue to "have a plan" for left leg during transition.  Patient continues to need cueing for technique with squat pivot transfer toward left side.  Patient now able to consistently stop and "reset" when transfer is unsafe in clinic environemnt.   Functional Mobility Worked on transition from bed height to satnding (static).  Patient able to stand safely 75%/time and maintain for 5 seconds.  Patient also able to reverse stand and sit safely consistently today.                  OT Education - 08/12/16 1105    Education provided Yes   Education Details squat pivot, bed mobility, safe standing technique as needed for ADL   Person(s) Educated Patient;Spouse   Methods Explanation;Demonstration   Comprehension Need further instruction;Verbal cues required;Tactile cues required          OT Short Term Goals - 08/10/16 1704      OT SHORT TERM GOAL #1   Title Pt/family independent with initial HEP  for LUE (due 06/29/16)   Status Achieved     OT SHORT TERM GOAL #2   Title Pt/family to verbalize understanding with proper positioning of LUE during activity and in bed to prevent pain including use of sling /splint prn   Status Achieved     OT SHORT TERM GOAL #3   Title Pt to perform UB dressing with min assist using hemi techniques   Status On-going     OT SHORT TERM GOAL #4   Title Pt will donn pants with mod A.   Status Achieved     OT SHORT TERM GOAL #5   Title Pt will use LUE as a stabilizer with min A 25 % of the time for ADLS/ functional activity   Status On-going           OT Long Term Goals - 08/10/16 1704      OT LONG TERM GOAL #1   Title Independent with updated HEP prn (all LTG's due 07/30/15)   Status On-going     OT LONG TERM GOAL #2    Title Pt to demo active low range sh. flexion to 25 degrees in prep for reaching and use of arm as stabilizer   Status On-going     OT LONG TERM GOAL #3   Title Pt to demo 25% gross finger flexion/extension to grasp/release cylindrical objects   Status On-going     OT LONG TERM GOAL #4   Title Pt will use LUE as a stabilizer for ADLs with min v.c. and pain less than or equal to 3/10   Status On-going     OT LONG TERM GOAL #5   Title Pt will perfrom toilet and shower transfers with min A.   Status Achieved               Plan - 08/12/16 1106    Clinical Impression Statement Patient and husband are showing improved knowledge of safety as patient works toward increased aprticipation / decreased dependence with basic ADL.     Rehab Potential Fair   OT Frequency 2x / week   OT Duration 8 weeks   OT Treatment/Interventions Self-care/ADL training;Moist Heat;DME and/or AE instruction;Splinting;Patient/family education;Therapeutic exercises;Compression bandaging;Therapeutic activities;Neuromuscular education;Functional Mobility Training;Passive range of motion;Cognitive remediation/compensation;Electrical Stimulation;Manual Therapy;Dry needling;Visual/perceptual remediation/compensation   Plan Review supine to sit/ sit to supine, squat pivot left and right, and sit to satnd and stand balance.  Work toward slightly dynamic stand balance   Consulted and Agree with Plan of Care Patient;Family member/caregiver   Family Member Consulted Husband - [name]      Patient will benefit from skilled therapeutic intervention in order to improve the following deficits and impairments:  Decreased activity tolerance, Decreased balance, Decreased cognition, Decreased knowledge of use of DME, Decreased coordination, Decreased endurance, Decreased range of motion, Decreased safety awareness, Decreased skin integrity, Decreased strength, Impaired perceived functional ability, Increased edema, Impaired sensation,  Impaired tone, Impaired UE functional use, Impaired vision/preception, Pain, Improper body mechanics, Decreased mobility, Abnormal gait, Difficulty walking, Decreased knowledge of precautions  Visit Diagnosis: Hemiplegia and hemiparesis following cerebral infarction affecting left non-dominant side (HCC)  Abnormal posture  Muscle weakness (generalized)  Unsteadiness on feet  Visuospatial deficit  Other lack of coordination  Acute pain of left shoulder  Cognitive social or emotional deficit following cerebral infarction    Problem List Patient Active Problem List   Diagnosis Date Noted  . 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 Orthostatic hypotension 04/08/2016  . Muscle  cramps 04/08/2016  . 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 Cerebral thrombosis with cerebral infarction 04/06/2016  . Basilar artery stenosis   . Dysarthria 04/05/2016  . History of CVA with residual deficit 04/05/2016  . Sinus tachycardia 04/05/2016  . Acute encephalopathy 04/05/2016  . Chronic pain 04/05/2016  . Other hyperlipidemia 04/05/2016  . Ischemic stroke (HCC)   . 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 TIA (transient ischemic attack) 11/07/2014  . Uncontrolled hypertension 11/07/2014  . Controlled type 2 diabetes mellitus with diabetic nephropathy (HCC) 11/07/2014    Collier Salina , OTR/L 08/12/2016, 11:08 AM  [facility] [address] [place], [place], [address] Phone: [phone]   Fax:  [phone]  Name: LIZANN EDELMAN MRN: [id number] Date of Birth: Jun 20, 1955

## 2016-08-15 ENCOUNTER — Ambulatory Visit: Payer: BC Managed Care – PPO | Admitting: Physical Therapy

## 2016-08-15 ENCOUNTER — Encounter: Payer: BC Managed Care – PPO | Admitting: Occupational Therapy

## 2016-08-16 ENCOUNTER — Encounter: Payer: BC Managed Care – PPO | Admitting: Occupational Therapy

## 2016-08-17 ENCOUNTER — Ambulatory Visit: Payer: BC Managed Care – PPO | Admitting: Occupational Therapy

## 2016-08-17 ENCOUNTER — Encounter: Payer: Self-pay | Admitting: Occupational Therapy

## 2016-08-17 ENCOUNTER — Ambulatory Visit: Payer: BC Managed Care – PPO | Admitting: Physical Therapy

## 2016-08-17 DIAGNOSIS — M25512 Pain in left shoulder: Secondary | ICD-10-CM

## 2016-08-17 DIAGNOSIS — I69354 Hemiplegia and hemiparesis following cerebral infarction affecting left non-dominant side: Secondary | ICD-10-CM

## 2016-08-17 DIAGNOSIS — R2681 Unsteadiness on feet: Secondary | ICD-10-CM

## 2016-08-17 DIAGNOSIS — M6281 Muscle weakness (generalized): Secondary | ICD-10-CM

## 2016-08-17 DIAGNOSIS — R41842 Visuospatial deficit: Secondary | ICD-10-CM

## 2016-08-17 DIAGNOSIS — R293 Abnormal posture: Secondary | ICD-10-CM

## 2016-08-17 DIAGNOSIS — R278 Other lack of coordination: Secondary | ICD-10-CM

## 2016-08-17 DIAGNOSIS — I69315 Cognitive social or emotional deficit following cerebral infarction: Secondary | ICD-10-CM

## 2016-08-17 NOTE — Therapy (Signed)
[facility] [address] [place], [place], [address] Phone: [phone]   Fax:  [phone]  Occupational Therapy Treatment  Patient Details  Name: Shelly Mcdaniel MRN: [id number] Date of Birth: Apr 11, 1956 Referring Provider: Dr. Claudette Laws  Encounter Date: 08/17/2016      OT End of Session - 08/17/16 1643    Visit Number 17   Number of Visits 25   Date for OT Re-Evaluation 09/30/16   Authorization Type BC/BS   OT Start Time 1449   OT Stop Time 1530   OT Time Calculation (min) 41 min   Activity Tolerance Patient tolerated treatment well   Behavior During Therapy [facility] for tasks assessed/performed      Past Medical History:  Diagnosis Date  . Diabetes mellitus without complication (HCC)   . Hyperlipidemia   . Hypertension   . Stroke (HCC)   . UTI (urinary tract infection)     Past Surgical History:  Procedure Laterality Date  . BACK SURGERY    . BREAST SURGERY    . COLONOSCOPY WITH PROPOFOL N/A 12/24/2013   Procedure: COLONOSCOPY WITH PROPOFOL;  Surgeon: Charolett Bumpers, MD;  Location: [facility];  Service: Endoscopy;  Laterality: N/A;    There were no vitals filed for this visit.      Subjective Assessment - 08/17/16 1638    Currently in Pain? Yes   Pain Score 2    Pain Location Buttocks   Pain Orientation Right;Left   Pain Descriptors / Indicators Aching   Pain Type Acute pain   Pain Radiating Towards from fall - lowered to floor per patient   Pain Onset Today   Pain Frequency Intermittent                      OT Treatments/Exercises (OP) - 08/17/16 0001      ADLs   Toileting Practiced squat pivot transfers and sit to/from stand transitions as needed for toilet transfers, toilet hygiene, and lower body dressing.  Patient had a fall today.  SHe transferred herself independently from bed to bedside commode.  She was unable to use her phone to call for help, she knew she would  have difficulty transferring to weaker left side to get back to bed, so she adjusted her wheelchair to transfer again to right side.  She apparently nearly made the transfer, but wheelchair brakes were not locked - and therefore patient slid to the floor.  In our practice today- we included set up of wheelchair including alignment of chair and securing both brakes.     Functional Mobility Worked on alignment and safety when transitioning from sit to stand, and stand to sit.  Patient with significantly improved activation in left leg today.                  OT Education - 08/17/16 1642    Education provided Yes   Education Details wheelchair set up for transfers to bed   Person(s) Educated Patient;Child(ren)   Methods Explanation;Demonstration   Comprehension Verbalized understanding;Need further instruction          OT Short Term Goals - 08/10/16 1704      OT SHORT TERM GOAL #1   Title Pt/family independent with initial HEP for LUE (due 06/29/16)   Status Achieved     OT SHORT TERM GOAL #2   Title Pt/family to verbalize understanding with proper positioning of LUE during activity and in bed to prevent pain  including use of sling /splint prn   Status Achieved     OT SHORT TERM GOAL #3   Title Pt to perform UB dressing with min assist using hemi techniques   Status On-going     OT SHORT TERM GOAL #4   Title Pt will donn pants with mod A.   Status Achieved     OT SHORT TERM GOAL #5   Title Pt will use LUE as a stabilizer with min A 25 % of the time for ADLS/ functional activity   Status On-going           OT Long Term Goals - 08/10/16 1704      OT LONG TERM GOAL #1   Title Independent with updated HEP prn (all LTG's due 07/30/15)   Status On-going     OT LONG TERM GOAL #2   Title Pt to demo active low range sh. flexion to 25 degrees in prep for reaching and use of arm as stabilizer   Status On-going     OT LONG TERM GOAL #3   Title Pt to demo 25% gross finger  flexion/extension to grasp/release cylindrical objects   Status On-going     OT LONG TERM GOAL #4   Title Pt will use LUE as a stabilizer for ADLs with min v.c. and pain less than or equal to 3/10   Status On-going     OT LONG TERM GOAL #5   Title Pt will perfrom toilet and shower transfers with min A.   Status Achieved               Plan - 08/17/16 1643    Clinical Impression Statement Patient and her daughter report increased success with transfers at home.  Patient had unfortunate fall today, but is showing improved participation with basic squat pivot transfers, and sit to stand transitions.    Rehab Potential Fair   OT Frequency 2x / week   OT Duration 8 weeks   OT Treatment/Interventions Self-care/ADL training;Moist Heat;DME and/or AE instruction;Splinting;Patient/family education;Therapeutic exercises;Compression bandaging;Therapeutic activities;Neuromuscular education;Functional Mobility Training;Passive range of motion;Cognitive remediation/compensation;Electrical Stimulation;Manual Therapy;Dry needling;Visual/perceptual remediation/compensation   Plan sit to/from stand and dynamic stand balance.  wheelchair set up for transfers   Consulted and Agree with Plan of Care Patient;Family member/caregiver   Family Member Consulted daughter Aundra MilletMegan      Patient will benefit from skilled therapeutic intervention in order to improve the following deficits and impairments:  Decreased activity tolerance, Decreased balance, Decreased cognition, Decreased knowledge of use of DME, Decreased coordination, Decreased endurance, Decreased range of motion, Decreased safety awareness, Decreased skin integrity, Decreased strength, Impaired perceived functional ability, Increased edema, Impaired sensation, Impaired tone, Impaired UE functional use, Impaired vision/preception, Pain, Improper body mechanics, Decreased mobility, Abnormal gait, Difficulty walking, Decreased knowledge of  precautions  Visit Diagnosis: Hemiplegia and hemiparesis following cerebral infarction affecting left non-dominant side (HCC)  Abnormal posture  Muscle weakness (generalized)  Unsteadiness on feet  Visuospatial deficit  Other lack of coordination  Acute pain of left shoulder  Cognitive social or emotional deficit following cerebral infarction    Problem List Patient Active Problem List   Diagnosis Date Noted  . 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 Orthostatic hypotension 04/08/2016  . Muscle cramps 04/08/2016  . 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 Cerebral thrombosis with cerebral infarction 04/06/2016  . Basilar artery stenosis   . Dysarthria 04/05/2016  . History  of CVA with residual deficit 04/05/2016  . Sinus tachycardia 04/05/2016  . Acute encephalopathy 04/05/2016  . Chronic pain 04/05/2016  . Other hyperlipidemia 04/05/2016  . Ischemic stroke (HCC)   . 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 TIA (transient ischemic attack) 11/07/2014  . Uncontrolled hypertension 11/07/2014  . Controlled type 2 diabetes mellitus with diabetic nephropathy (HCC) 11/07/2014    Collier Salina 08/17/2016, 4:47 PM  [facility] [address] [place], [place], [address] Phone: [phone]   Fax:  [phone]  Name: SHUNTELL FOODY MRN: [id number] Date of Birth: [birth year]

## 2016-08-18 ENCOUNTER — Ambulatory Visit: Payer: BC Managed Care – PPO | Admitting: Rehabilitation

## 2016-08-18 ENCOUNTER — Ambulatory Visit: Payer: BC Managed Care – PPO | Admitting: Occupational Therapy

## 2016-08-19 ENCOUNTER — Encounter: Payer: BC Managed Care – PPO | Admitting: Occupational Therapy

## 2016-08-19 ENCOUNTER — Ambulatory Visit: Payer: BC Managed Care – PPO | Admitting: Rehabilitation

## 2016-08-22 ENCOUNTER — Ambulatory Visit: Payer: BC Managed Care – PPO | Admitting: Occupational Therapy

## 2016-08-22 ENCOUNTER — Encounter: Payer: Self-pay | Admitting: Physical Therapy

## 2016-08-22 ENCOUNTER — Ambulatory Visit: Payer: BC Managed Care – PPO | Admitting: Physical Therapy

## 2016-08-22 ENCOUNTER — Encounter: Payer: BC Managed Care – PPO | Admitting: Speech Pathology

## 2016-08-22 DIAGNOSIS — M6281 Muscle weakness (generalized): Secondary | ICD-10-CM

## 2016-08-22 DIAGNOSIS — R293 Abnormal posture: Secondary | ICD-10-CM

## 2016-08-22 DIAGNOSIS — I69354 Hemiplegia and hemiparesis following cerebral infarction affecting left non-dominant side: Secondary | ICD-10-CM | POA: Diagnosis not present

## 2016-08-22 DIAGNOSIS — R2681 Unsteadiness on feet: Secondary | ICD-10-CM

## 2016-08-22 DIAGNOSIS — R2689 Other abnormalities of gait and mobility: Secondary | ICD-10-CM

## 2016-08-22 NOTE — Therapy (Signed)
[facility] [address] [place], [place], [address] Phone: [phone]   Fax:  [phone]  Physical Therapy Treatment  Patient Details  Name: Shelly Mcdaniel MRN: [id number] Date of Birth: [birth year] Referring Provider: Rosalin Hawking, MD and Alysia Penna, MD  Encounter Date: 08/22/2016      PT End of Session - 08/22/16 1341    Visit Number 17   Number of Visits 29   Date for PT Re-Evaluation 09/27/16   Authorization Type [employer] 0 visit limit, 0 auth   PT Start Time 1105   PT Stop Time 1150   PT Time Calculation (min) 45 min   Equipment Utilized During Treatment Gait belt   Activity Tolerance Patient tolerated treatment well   Behavior During Therapy WFL for tasks assessed/performed      Past Medical History:  Diagnosis Date  . Diabetes mellitus without complication ([place])   . Hyperlipidemia   . Hypertension   . Stroke ([place])   . UTI (urinary tract infection)     Past Surgical History:  Procedure Laterality Date  . BACK SURGERY    . BREAST SURGERY    . COLONOSCOPY WITH PROPOFOL N/A 12/24/2013   Procedure: COLONOSCOPY WITH PROPOFOL;  Surgeon: [name], MD;  Location: [facility];  Service: Endoscopy;  Laterality: N/A;    There were no vitals filed for this visit.      Subjective Assessment - 08/22/16 1104    Subjective Pt just came from the doctor and has been taken off Phenergan; there was an issue with dosage and she had had a lot of nausea; she also had virus last week.   Currently in Pain? No/denies   Pain Onset Today                         [facility] Adult PT Treatment/Exercise - 08/22/16 0001      Transfers   Transfers Sit to Stand;Stand to Sit;Stand Pivot Transfers;Squat Pivot Transfers  Worked on Sports administrator with ea. multiple reps   Sit to Stand 4: Min assist;3: Mod assist  work in various hand placement: no UE support, w/c armrest, mat   Sit to Stand  Details Manual facilitation for weight shifting;Tactile cues for sequencing and posture;Tactile cues for initiation   Stand Pivot Transfers 4: Min assist   Stand Pivot Transfer Details (indicate cue type and reason) cues for quad cane placement and sequencing   Squat Pivot Transfers 3: Mod assist   Squat Pivot Transfer Details (indicate cue type and reason) worked on forward weight shifts, without UE support to increase LLE weight bearing- had pt hold left arm for greater left side involement, used mirror for postural feedback.     Ambulation/Gait   Ambulation/Gait Yes   Ambulation/Gait Assistance 3: Mod assist  2nd person present for safety   Ambulation/Gait Assistance Details cues for posture, quad cane placement, and sequencing  Pt fatigued quickly requesting seated rest breaks.   Ambulation Distance (Feet) 45 Feet  x2   Assistive device Large base quad cane   Gait Pattern Step-through pattern;Decreased stance time - left;Decreased hip/knee flexion - left;Decreased dorsiflexion - left;Lateral trunk lean to right;Decreased arm swing - left;Decreased weight shift to left;Narrow base of support;Left circumduction;Left genu recurvatum;Ataxic   Ambulation Surface Level;Indoor                  PT Short Term Goals - 07/14/16 1840  PT SHORT TERM GOAL #1   Title Pt will initiate HEP in order to indicate improved functional mobility and decreased fall risk.  (Target Date: 06/30/16)   Baseline 07/13/16: met today as HEP was issued today   Status Achieved     PT SHORT TERM GOAL #2   Title Pt will perform stand pivot transfer w/ LRAD at S level in order to increase independence at home.     Baseline 1/301/18: pt continues to need up to min assist both with and without AD (per previous notes for without AD, used AD today)   Status Not Met     PT SHORT TERM GOAL #3   Title Pt will perform bed mobility at mod I level in order to indicate improved functional independence.     Baseline  07/13/16: pt at supervision level mostly, continues to need assistance to lift left LE onto surface and cues on sequencing/technique placing her at supervision   Status Partially Met     PT SHORT TERM GOAL #4   Title Pt will ambulate 100' w/ LRAD at min A level in order to indicate improved household ambulation.     Time 4   Period Weeks   Status Not Met     PT SHORT TERM GOAL #5   Title Pt will perform PASS and improve score 3 points in order to indicate improved functional mobility.     Time 4   Period Weeks   Status On-going           PT Long Term Goals - 07/29/16 1220      PT LONG TERM GOAL #1   Title Pt/spouse will be independent with HEP in order to indicate improved functional mobility and decreased fall risk.  (Modified Target Date: 08/28/16)   Baseline met 07/28/16   Time 8   Period Weeks   Status Achieved     PT LONG TERM GOAL #2   Title Pt will perform stand pivot transfers w/ LRAD at S level in order to indicate improved functional independence. 08/28/16   Baseline min A level with L PFRW on 07/28/16   Time 4   Period Weeks   Status Revised     PT LONG TERM GOAL #3   Title Pt will ambulate x 100' w/ LRAD at S level over indoor surfaces in order to increase independence with household ambulation.  08/28/16   Baseline up to 50-80' at a time with L PFRW with min A   Time 4   Period Weeks   Status Revised     PT LONG TERM GOAL #4   Title Pt will perform dynamic standing balance with intermittent UE support x 10 mins at S level in order to increase independence with ADLs. 08/28/16   Baseline revised 07/29/16 to indicate assist level    Time 4   Period Weeks   Status Revised     PT LONG TERM GOAL #5   Title Pt will ambulate over paved outdoor surfaces w/ LRAD x 200' at S level in order to indicate initiation of community ambulation.    Baseline not appropriate at this time   Time 8   Period Weeks   Status Deferred               Plan - 08/22/16 1351     Clinical Impression Statement Pt has not been feeling well for about the last week and had been less mobile at home.  Pt seems to be  doing better today and had just come from the doctor who had adjusted her medication (Phenergan).  Treatement focused on a lot of repetition of body mechanics for basic mobility movments.  Pt participated well but fatigued quickly and required min to Mod A with mobility training.                                                           Rehab Potential Good   Clinical Impairments Affecting Rehab Potential pt motivated by may be limited due to medical complexity and poor cognition   PT Frequency 2x / week   PT Duration 4 weeks   PT Treatment/Interventions ADLs/Self Care Home Management;Canalith Repostioning;Electrical Stimulation;DME Instruction;Stair training;Gait training;Functional mobility training;Therapeutic activities;Therapeutic exercise;Balance training;Neuromuscular re-education;Patient/family education;Orthotic Fit/Training;Passive range of motion;Energy conservation;Taping;Vestibular;Visual/perceptual remediation/compensation   PT Next Visit Plan check goals; discuss POC.   Consulted and Agree with Plan of Care Patient      Patient will benefit from skilled therapeutic intervention in order to improve the following deficits and impairments:  Abnormal gait, Decreased activity tolerance, Decreased balance, Decreased cognition, Decreased coordination, Decreased endurance, Decreased knowledge of precautions, Decreased knowledge of use of DME, Decreased mobility, Decreased safety awareness, Decreased strength, Impaired perceived functional ability, Impaired flexibility, Impaired sensation, Impaired tone, Impaired UE functional use, Improper body mechanics, Postural dysfunction  Visit Diagnosis: Hemiplegia and hemiparesis following cerebral infarction affecting left non-dominant side (HCC)  Abnormal posture  Muscle weakness (generalized)  Unsteadiness on  feet  Other abnormalities of gait and mobility     Problem List Patient Active Problem List   Diagnosis Date Noted  . 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 Orthostatic hypotension 04/08/2016  . Muscle cramps 04/08/2016  . Right pontine cerebrovascular accident ([place]) 04/08/2016  . Spastic hemiparesis of left nondominant side ([place]) 04/08/2016  . Cerebral thrombosis with cerebral infarction 04/06/2016  . Basilar artery stenosis   . Dysarthria 04/05/2016  . History of CVA with residual deficit 04/05/2016  . Sinus tachycardia 04/05/2016  . Acute encephalopathy 04/05/2016  . Chronic pain 04/05/2016  . Other hyperlipidemia 04/05/2016  . Ischemic stroke ([place])   . 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 TIA (transient ischemic attack) 11/07/2014  . Uncontrolled hypertension 11/07/2014  . Controlled type 2 diabetes mellitus with diabetic nephropathy ([place]) 11/07/2014    Bjorn Loser, PTA  08/22/16, 1:58 PM [facility] [address] [place], [place], [address] Phone: [phone]   Fax:  [phone]  Name: Shelly Mcdaniel MRN: [id number] Date of Birth: Sep 09, 1955

## 2016-08-23 ENCOUNTER — Ambulatory Visit
Admission: RE | Admit: 2016-08-23 | Discharge: 2016-08-23 | Disposition: A | Discharge status: Home / Self Care | Payer: BC Managed Care – PPO | Source: Ambulatory Visit | Attending: Geriatric Medicine | Admitting: Geriatric Medicine

## 2016-08-23 DIAGNOSIS — Z1231 Encounter for screening mammogram for malignant neoplasm of breast: Secondary | ICD-10-CM

## 2016-08-24 ENCOUNTER — Encounter (HOSPITAL_COMMUNITY): Payer: Self-pay

## 2016-08-24 ENCOUNTER — Encounter: Payer: BC Managed Care – PPO | Admitting: Occupational Therapy

## 2016-08-24 ENCOUNTER — Ambulatory Visit: Payer: BC Managed Care – PPO | Admitting: Physical Therapy

## 2016-08-24 ENCOUNTER — Emergency Department (HOSPITAL_COMMUNITY)
Admission: EM | Admit: 2016-08-24 | Discharge: 2016-08-24 | Disposition: A | Discharge status: Home / Self Care | Payer: BC Managed Care – PPO | Attending: Emergency Medicine | Admitting: Emergency Medicine

## 2016-08-24 ENCOUNTER — Emergency Department (HOSPITAL_COMMUNITY): Payer: BC Managed Care – PPO

## 2016-08-24 DIAGNOSIS — Z7982 Long term (current) use of aspirin: Secondary | ICD-10-CM | POA: Diagnosis not present

## 2016-08-24 DIAGNOSIS — R4182 Altered mental status, unspecified: Secondary | ICD-10-CM | POA: Diagnosis present

## 2016-08-24 DIAGNOSIS — Z79899 Other long term (current) drug therapy: Secondary | ICD-10-CM | POA: Diagnosis not present

## 2016-08-24 DIAGNOSIS — Z7984 Long term (current) use of oral hypoglycemic drugs: Secondary | ICD-10-CM | POA: Insufficient documentation

## 2016-08-24 DIAGNOSIS — R404 Transient alteration of awareness: Secondary | ICD-10-CM | POA: Insufficient documentation

## 2016-08-24 DIAGNOSIS — Z7902 Long term (current) use of antithrombotics/antiplatelets: Secondary | ICD-10-CM | POA: Diagnosis not present

## 2016-08-24 DIAGNOSIS — I10 Essential (primary) hypertension: Secondary | ICD-10-CM | POA: Insufficient documentation

## 2016-08-24 DIAGNOSIS — Z8673 Personal history of transient ischemic attack (TIA), and cerebral infarction without residual deficits: Secondary | ICD-10-CM | POA: Insufficient documentation

## 2016-08-24 DIAGNOSIS — E876 Hypokalemia: Secondary | ICD-10-CM | POA: Diagnosis not present

## 2016-08-24 LAB — COMPREHENSIVE METABOLIC PANEL
ALT: 12 U/L — ABNORMAL LOW (ref 14–54)
ANION GAP: 10 (ref 5–15)
AST: 16 U/L (ref 15–41)
Albumin: 4.1 g/dL (ref 3.5–5.0)
Alkaline Phosphatase: 55 U/L (ref 38–126)
BUN: 17 mg/dL (ref 6–20)
CHLORIDE: 105 mmol/L (ref 101–111)
CO2: 27 mmol/L (ref 22–32)
Calcium: 9.9 mg/dL (ref 8.9–10.3)
Creatinine, Ser: 0.62 mg/dL (ref 0.44–1.00)
Glucose, Bld: 125 mg/dL — ABNORMAL HIGH (ref 65–99)
POTASSIUM: 3.2 mmol/L — AB (ref 3.5–5.1)
SODIUM: 142 mmol/L (ref 135–145)
Total Bilirubin: 0.2 mg/dL — ABNORMAL LOW (ref 0.3–1.2)
Total Protein: 7.1 g/dL (ref 6.5–8.1)

## 2016-08-24 LAB — CBC
HCT: 37.7 % (ref 36.0–46.0)
HEMOGLOBIN: 12.6 g/dL (ref 12.0–15.0)
MCH: 29.9 pg (ref 26.0–34.0)
MCHC: 33.4 g/dL (ref 30.0–36.0)
MCV: 89.5 fL (ref 78.0–100.0)
PLATELETS: 229 10*3/uL (ref 150–400)
RBC: 4.21 MIL/uL (ref 3.87–5.11)
RDW: 13.2 % (ref 11.5–15.5)
WBC: 5.7 10*3/uL (ref 4.0–10.5)

## 2016-08-24 LAB — URINALYSIS, ROUTINE W REFLEX MICROSCOPIC
Bacteria, UA: NONE SEEN
Bilirubin Urine: NEGATIVE
GLUCOSE, UA: NEGATIVE mg/dL
HGB URINE DIPSTICK: NEGATIVE
KETONES UR: NEGATIVE mg/dL
Leukocytes, UA: NEGATIVE
NITRITE: NEGATIVE
PH: 5 (ref 5.0–8.0)
PROTEIN: 30 mg/dL — AB
Specific Gravity, Urine: 1.023 (ref 1.005–1.030)

## 2016-08-24 LAB — CBG MONITORING, ED: Glucose-Capillary: 115 mg/dL — ABNORMAL HIGH (ref 65–99)

## 2016-08-24 LAB — PROTIME-INR
INR: 0.94
Prothrombin Time: 12.5 seconds (ref 11.4–15.2)

## 2016-08-24 MED ORDER — POTASSIUM CHLORIDE 20 MEQ/15ML (10%) PO SOLN
40.0000 meq | Freq: Once | ORAL | Status: DC
  Filled 2016-08-24: qty 30

## 2016-08-24 MED ORDER — ONDANSETRON 4 MG PO TBDP
8.0000 mg | ORAL_TABLET | Freq: Once | ORAL | Status: AC
  Administered 2016-08-24: 8 mg via ORAL
  Filled 2016-08-24: qty 2

## 2016-08-24 MED ORDER — POTASSIUM CHLORIDE CRYS ER 20 MEQ PO TBCR
40.0000 meq | EXTENDED_RELEASE_TABLET | Freq: Once | ORAL | Status: DC
  Filled 2016-08-24: qty 2

## 2016-08-24 NOTE — Discharge Instructions (Signed)
Continue with your current medications.  Start taking your potassium pills - take two pills at a time, twice a day for the next week, then reduce to on pill twice a day.  Return if you are having any problems.

## 2016-08-24 NOTE — ED Provider Notes (Signed)
[facility]-EMERGENCY DEPT Provider Note   CSN: [id number] Arrival date & time: 08/24/16  1354     History   Chief Complaint Chief Complaint  Patient presents with  . Altered Mental Status  . Recurrent UTI    HPI Shelly Mcdaniel is a 61 y.o. female.  She is currently undergoing rehabilitation for 2 strokes and has residual left hemiparesis. At 1 PM, she had an episode where she was staring straight ahead and not responding verbally. She had food in her mouth, but did not chew it. Her husband, who was with her at the time, states it lasted about 5 minutes. She did have some response to physical stimuli (withdrawal), but no purposeful movement. There was no shaking seen and no incontinence. She has had problems with urinary tract infections in the past, but catheterized urine 1 week ago was sterile. She is being followed by neurology and has an appointment in one week at which time she is being considered for possible CT angiogram to evaluate for possible basilar artery insufficiency. Family relates that patient is back to her baseline currently.   The history is provided by the patient, the spouse and a relative.  Altered Mental Status      Past Medical History:  Diagnosis Date  . Diabetes mellitus without complication ([facility])   . Hyperlipidemia   . Hypertension   . Stroke ([facility])   . UTI (urinary tract infection)     Patient Active Problem List   Diagnosis Date Noted  . 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 Orthostatic hypotension 04/08/2016  . Muscle cramps 04/08/2016  . 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 Cerebral thrombosis with cerebral infarction 04/06/2016  . Basilar artery stenosis   . Dysarthria 04/05/2016  . History of CVA with residual deficit 04/05/2016  . Sinus tachycardia 04/05/2016  . Acute encephalopathy 04/05/2016  . Chronic pain 04/05/2016  . Other hyperlipidemia  04/05/2016  . Ischemic stroke ([facility])   . 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 TIA (transient ischemic attack) 11/07/2014  . Uncontrolled hypertension 11/07/2014  . Controlled type 2 diabetes mellitus with diabetic nephropathy ([facility]) 11/07/2014    Past Surgical History:  Procedure Laterality Date  . BACK SURGERY    . BREAST SURGERY    . COLONOSCOPY WITH PROPOFOL N/A 12/24/2013   Procedure: COLONOSCOPY WITH PROPOFOL;  Surgeon: Charolett Bumpers, MD;  Location: [facility];  Service: Endoscopy;  Laterality: N/A;    OB History    No data available       Home Medications    Prior to Admission medications   Medication Sig Start Date End Date Taking? Authorizing Provider  aspirin EC 325 MG EC tablet Take 1 tablet (325 mg total) by mouth daily. 04/09/16   Nishant Dhungel, MD  baclofen (LIORESAL) 10 MG tablet Take 1.5 tablets (15 mg total) by mouth 3 (three) times daily. 04/29/16   Mcarthur Rossetti Angiulli, PA-C  cholecalciferol (VITAMIN D) 1000 units tablet Take 2,000 Units by mouth daily.    Historical Provider, MD  clopidogrel (PLAVIX) 75 MG tablet Take 1 tablet (75 mg total) by mouth daily. 04/29/16   Mcarthur Rossetti Angiulli, PA-C  docusate sodium (COLACE) 100 MG capsule Take 100 mg by mouth 2 (two) times daily.     Historical Provider, MD  LEVEMIR FLEXTOUCH 100 UNIT/ML Pen Inject 25 Units into the skin every evening. Patient taking differently: Inject 18-25 Units into the skin every evening.  04/29/16   Mcarthur Rossettianiel J Angiulli, PA-C  lidocaine (LIDODERM) 5 % Place 3 patches onto the skin daily. Remove & Discard patch within 12 hours or as directed by MD Patient taking differently: Place 3 patches onto the skin daily as needed (pain). Remove & Discard patch within 12 hours or as directed by MD 04/29/16   Mcarthur Rossettianiel J Angiulli, PA-C  Melatonin 3 MG TABS Place 2 tablets (6 mg total) into feeding tube at bedtime. Patient taking differently: Take 6 mg by mouth at bedtime.  04/29/16   Mcarthur Rossettianiel J Angiulli, PA-C  metFORMIN  (GLUCOPHAGE) 500 MG tablet Take 1,000 mg by mouth 2 (two) times daily with a meal.    Historical Provider, MD  methylphenidate (RITALIN) 5 MG tablet Take 1 tablet (5 mg total) by mouth 2 (two) times daily with breakfast and lunch. 04/29/16   Mcarthur Rossettianiel J Angiulli, PA-C  metoprolol succinate (TOPROL-XL) 25 MG 24 hr tablet Take 25 mg by mouth every morning.    Historical Provider, MD  midodrine (PROAMATINE) 5 MG tablet Take 1 tablet (5 mg total) by mouth 2 (two) times daily with a meal. One in the morning and one in the early afternoon and no later than 4pm 05/23/16   Marvel PlanJindong Xu, MD  ondansetron (ZOFRAN) 4 MG tablet Take 1 tablet (4 mg total) by mouth every 8 (eight) hours as needed for nausea or vomiting. 06/06/16   Fayrene HelperBowie Tran, PA-C  pantoprazole (PROTONIX) 40 MG tablet Take 40 mg by mouth daily.    Historical Provider, MD  polyethylene glycol (MIRALAX / GLYCOLAX) packet Take 17 g by mouth daily as needed for moderate constipation.    Historical Provider, MD  potassium chloride (K-DUR,KLOR-CON) 10 MEQ tablet Take 1 tablet (10 mEq total) by mouth 2 (two) times daily. 04/29/16   Mcarthur Rossettianiel J Angiulli, PA-C  scopolamine (TRANSDERM-SCOP) 1 MG/3DAYS Place 1 patch (1.5 mg total) onto the skin every 3 (three) days. Patient taking differently: Place 1 patch onto the skin daily as needed (nausea).  04/29/16   Mcarthur Rossettianiel J Angiulli, PA-C  senna (SENOKOT) 8.6 MG tablet Take 1 tablet by mouth daily as needed for constipation.     Historical Provider, MD  sertraline (ZOLOFT) 50 MG tablet Take 1 tablet (50 mg total) by mouth at bedtime. 04/29/16   Mcarthur Rossettianiel J Angiulli, PA-C  simvastatin (ZOCOR) 10 MG tablet Take 10 mg by mouth daily.    Historical Provider, MD  traMADol (ULTRAM) 50 MG tablet Take 1 tablet (50 mg total) by mouth every 6 (six) hours as needed. 06/29/16   Roxy Horsemanobert Browning, PA-C    Family History Family History  Problem Relation Age of Onset  . Stroke Mother   . Dementia Mother   . Diabetes Father   . Stroke  Father   . CAD Father   . Diabetes Sister   . Diabetes Brother   . CAD Brother   . Stroke Brother   . Kidney failure Brother   . Hypertension Other   . Hyperlipidemia Other   . Stroke Other   . Heart disease Other   . Diabetes Other     Social History Social History  Substance Use Topics  . Smoking status: Never Smoker  . Smokeless tobacco: Never Used  . Alcohol use No     Allergies   Bee venom; Codeine; Lipitor [atorvastatin]; and Septra [sulfamethoxazole-trimethoprim]   Review of Systems Review of Systems  All other systems reviewed and are negative.    Physical Exam Updated Vital Signs  BP 157/96 (BP Location: Right Arm)   Pulse 83   Temp 97.6 F (36.4 C) (Oral)   Resp 22   SpO2 99%   Physical Exam  Nursing note and vitals reviewed.  62 year old female, resting comfortably and in no acute distress. Vital signs are significant for hypertension and tachypnea. Oxygen saturation is 99%, which is normal. Head is normocephalic and atraumatic. PERRLA, EOMI. Oropharynx is clear. Neck is nontender and supple without adenopathy or JVD. Back is nontender and there is no CVA tenderness. Lungs are clear without rales, wheezes, or rhonchi. Chest is nontender. Heart has regular rate and rhythm without murmur. Abdomen is soft, flat, nontender without masses or hepatosplenomegaly and peristalsis is normoactive. Extremities have no cyanosis or edema, full range of motion is present. Skin is warm and dry without rash. Neurologic: She is awake and alert and oriented. Speech is slightly slow and minimally dysarthric. There is a dense left hemi-para cyst with a left Babinski response.  ED Treatments / Results  Labs (all labs ordered are listed, but only abnormal results are displayed) Labs Reviewed  COMPREHENSIVE METABOLIC PANEL - Abnormal; Notable for the following:       Result Value   Potassium 3.2 (*)    Glucose, Bld 125 (*)    ALT 12 (*)    Total Bilirubin 0.2 (*)      All other components within normal limits  URINALYSIS, ROUTINE W REFLEX MICROSCOPIC - Abnormal; Notable for the following:    Protein, ur 30 (*)    Squamous Epithelial / LPF 0-5 (*)    All other components within normal limits  CBG MONITORING, ED - Abnormal; Notable for the following:    Glucose-Capillary 115 (*)    All other components within normal limits  CBC  PROTIME-INR    EKG  EKG Interpretation  Date/Time:  Wednesday August 24 2016 14:00:34 EDT Ventricular Rate:  91 PR Interval:  150 QRS Duration: 82 QT Interval:  392 QTC Calculation: 482 R Axis:   77 Text Interpretation:  Normal sinus rhythm Anteroseptal infarct , age undetermined Abnormal ECG When compared with ECG of 05/13/2016, No significant change was found Confirmed by [facility]  MD, Saylor Sheckler ([id number]) on 08/24/2016 2:53:52 PM       Radiology Ct Head Wo Contrast  Result Date: 08/24/2016 CLINICAL DATA:  Confusion. EXAM: CT HEAD WITHOUT CONTRAST TECHNIQUE: Contiguous axial images were obtained from the base of the skull through the vertex without intravenous contrast. COMPARISON:  04/05/2016 FINDINGS: Brain: Area of low-density within the right brainstem compatible with old infarct as seen on prior MRI. Diffuse chronic microvascular disease throughout the deep white matter. No acute infarction. No hemorrhage or hydrocephalus. Vascular: No hyperdense vessel or unexpected calcification. Skull: No acute calvarial abnormality. Sinuses/Orbits: Opacification of the right frontal sinus. Remainder the paranasal sinuses and mastoids are clear. Other: None IMPRESSION: Old right brainstem infarct. No acute intracranial abnormality. Chronic microvascular disease. Electronically Signed   By: Charlett Nose M.D.   On: 08/24/2016 16:54   Mm Screening Breast Tomo Bilateral  Result Date: 08/23/2016 CLINICAL DATA:  Screening. EXAM: 2D DIGITAL SCREENING BILATERAL MAMMOGRAM WITH CAD AND ADJUNCT TOMO COMPARISON:  Previous exam(s). ACR Breast Density  Category b: There are scattered areas of fibroglandular density. FINDINGS: There are no findings suspicious for malignancy. Images were processed with CAD. IMPRESSION: No mammographic evidence of malignancy. A result letter of this screening mammogram will be mailed directly to the patient. RECOMMENDATION: Screening mammogram in one year. (Code:SM-B-01Y)  BI-RADS CATEGORY  1: Negative. Electronically Signed   By: Gerome Sam III M.D   On: 08/23/2016 13:48    Procedures Procedures (including critical care time)  Medications Ordered in ED Medications  potassium chloride SA (K-DUR,KLOR-CON) CR tablet 40 mEq (40 mEq Oral Not Given 08/24/16 1555)  potassium chloride 20 MEQ/15ML (10%) solution 40 mEq (40 mEq Oral Not Given 08/24/16 1623)  ondansetron (ZOFRAN-ODT) disintegrating tablet 8 mg (not administered)     Initial Impression / Assessment and Plan / ED Course  I have reviewed the triage vital signs and the nursing notes.  Pertinent labs & imaging results that were available during my care of the patient were reviewed by me and considered in my medical decision making (see chart for details).  Episode of staring which I am suspicious represents a seizure. Old records are reviewed confirming patient going through rehabilitation for pontine stroke. Electrolytes show hypokalemia which do not account for her acute mental status change. She's given potassium supplementation. She'll be sent for CT of head, and will check urinalysis.  Patient was not able to tolerate potassium in the ED (pills were too bake, and liquid was too unpalatable). She does have potassium pills at home which he is able to tolerate. CT shows no acute process. Urinalysis shows no evidence of infection. She continued with normal mental status for her throughout her ED stay. She is discharged with instructions to start taking her home potassium. I did discuss her case with Dr. Cena Benton of neurology service with specific question about  whether to start anticonvulsant therapy. He recommended not starting anticonvulsant therapy until she could be evaluated by her neurologist. She has an appointment already scheduled for 1 week from today. She is to keep that appointment.  Final Clinical Impressions(s) / ED Diagnoses   Final diagnoses:  Transient alteration of awareness  Hypokalemia    New Prescriptions New Prescriptions   No medications on file     Dione Booze, MD 08/24/16 1610

## 2016-08-24 NOTE — ED Triage Notes (Addendum)
Pt here with family. She had an episode of acute confusion. Family reports that she was at lunch and became confused at approximately 1345. Pt has hx of stroke with severe residual deficits. Pt also has recurrent UTI's that cause stroke like symptoms as well. Pt alert and appropriately responding to questions at this time.

## 2016-08-25 ENCOUNTER — Encounter: Payer: Self-pay | Admitting: Occupational Therapy

## 2016-08-25 ENCOUNTER — Encounter: Payer: Self-pay | Admitting: Rehabilitation

## 2016-08-25 ENCOUNTER — Ambulatory Visit: Payer: BC Managed Care – PPO | Admitting: Occupational Therapy

## 2016-08-25 ENCOUNTER — Ambulatory Visit: Payer: BC Managed Care – PPO | Admitting: Rehabilitation

## 2016-08-25 ENCOUNTER — Encounter: Payer: BC Managed Care – PPO | Attending: Physical Medicine & Rehabilitation

## 2016-08-25 ENCOUNTER — Encounter: Payer: Self-pay | Admitting: Physical Medicine & Rehabilitation

## 2016-08-25 ENCOUNTER — Ambulatory Visit (HOSPITAL_BASED_OUTPATIENT_CLINIC_OR_DEPARTMENT_OTHER): Payer: BC Managed Care – PPO | Admitting: Physical Medicine & Rehabilitation

## 2016-08-25 VITALS — BP 160/100 | HR 93 | Resp 14

## 2016-08-25 DIAGNOSIS — E785 Hyperlipidemia, unspecified: Secondary | ICD-10-CM | POA: Diagnosis not present

## 2016-08-25 DIAGNOSIS — G8114 Spastic hemiplegia affecting left nondominant side: Secondary | ICD-10-CM | POA: Diagnosis not present

## 2016-08-25 DIAGNOSIS — Z794 Long term (current) use of insulin: Secondary | ICD-10-CM | POA: Insufficient documentation

## 2016-08-25 DIAGNOSIS — I10 Essential (primary) hypertension: Secondary | ICD-10-CM | POA: Diagnosis not present

## 2016-08-25 DIAGNOSIS — I651 Occlusion and stenosis of basilar artery: Secondary | ICD-10-CM | POA: Insufficient documentation

## 2016-08-25 DIAGNOSIS — M6281 Muscle weakness (generalized): Secondary | ICD-10-CM

## 2016-08-25 DIAGNOSIS — I69354 Hemiplegia and hemiparesis following cerebral infarction affecting left non-dominant side: Secondary | ICD-10-CM

## 2016-08-25 DIAGNOSIS — R293 Abnormal posture: Secondary | ICD-10-CM

## 2016-08-25 DIAGNOSIS — R2689 Other abnormalities of gait and mobility: Secondary | ICD-10-CM

## 2016-08-25 DIAGNOSIS — E1140 Type 2 diabetes mellitus with diabetic neuropathy, unspecified: Secondary | ICD-10-CM | POA: Insufficient documentation

## 2016-08-25 DIAGNOSIS — R278 Other lack of coordination: Secondary | ICD-10-CM

## 2016-08-25 DIAGNOSIS — R2681 Unsteadiness on feet: Secondary | ICD-10-CM

## 2016-08-25 DIAGNOSIS — M25512 Pain in left shoulder: Secondary | ICD-10-CM

## 2016-08-25 DIAGNOSIS — I69315 Cognitive social or emotional deficit following cerebral infarction: Secondary | ICD-10-CM

## 2016-08-25 DIAGNOSIS — R4182 Altered mental status, unspecified: Secondary | ICD-10-CM | POA: Insufficient documentation

## 2016-08-25 DIAGNOSIS — M7502 Adhesive capsulitis of left shoulder: Secondary | ICD-10-CM

## 2016-08-25 DIAGNOSIS — R41842 Visuospatial deficit: Secondary | ICD-10-CM

## 2016-08-25 NOTE — Therapy (Signed)
[facility] [address] [place], [place], [address] Phone: [phone]   Fax:  [phone]  Physical Therapy Treatment and D/C Summary  Patient Details  Name: Shelly Mcdaniel MRN: [id number] Date of Birth: 10/22/1955 Referring Provider: Rosalin Hawking, MD and Alysia Penna, MD  Encounter Date: 08/25/2016      PT End of Session - 08/25/16 1526    Visit Number 17   Number of Visits 29   Date for PT Re-Evaluation 09/27/16   Authorization Type BCBS 0 visit limit, 0 auth   PT Start Time 1402   PT Stop Time 1445   PT Time Calculation (min) 43 min   Equipment Utilized During Treatment Gait belt   Activity Tolerance Patient tolerated treatment well   Behavior During Therapy WFL for tasks assessed/performed      Past Medical History:  Diagnosis Date  . Diabetes mellitus without complication ([place])   . Hyperlipidemia   . Hypertension   . Stroke ([place])   . UTI (urinary tract infection)     Past Surgical History:  Procedure Laterality Date  . BACK SURGERY    . BREAST SURGERY    . COLONOSCOPY WITH PROPOFOL N/A 12/24/2013   Procedure: COLONOSCOPY WITH PROPOFOL;  Surgeon: [name], MD;  Location: [facility];  Service: Endoscopy;  Laterality: N/A;    There were no vitals filed for this visit.      Subjective Assessment - 08/25/16 1407    Subjective Pt reports to PT regarding ED visit and they feel that it may have been a seizure but made no changes.    Patient is accompained by: Family member   Limitations House hold activities;Walking   Patient Stated Goals "I want to work on my left extremity."    Currently in Pain? No/denies                         [facility] Adult PT Treatment/Exercise - 08/25/16 1405      Transfers   Transfers Sit to Stand;Stand to Sit;Squat Pivot Transfers;Stand Pivot Transfers   Sit to Stand 4: Min guard;With upper extremity assist;From elevated surface   Sit to Stand  Details Tactile cues for initiation;Verbal cues for sequencing;Verbal cues for technique   Sit to Stand Details (indicate cue type and reason) Provided least amount of assistance during transfers to allow pt to self moderate amount of forward weight shift and push through LEs.  She was able to perform x 4 reps at min/guard A with cues for sequencing and LE placement   Stand to Sit 4: Min guard;With upper extremity assist   Stand to Sit Details (indicate cue type and reason) Verbal cues for sequencing;Verbal cues for technique;Verbal cues for precautions/safety;Tactile cues for weight beaing   Stand to Sit Details Cues for controlled sitting   Stand Pivot Transfers 4: Min assist;3: Mod assist   Stand Pivot Transfer Details (indicate cue type and reason) Had [name] demonstrate to PT on how they are performing transfers at home.  Despite recommendations for squat pivot transfers in order to improve safety, increase LE activation and allow increased indpendence.  [name] tends to provide too much assist and also note that they do note communicate well during transfer.  Education on why squat pivot important at this time.  They both verbalize understanding.     Squat Pivot Transfers 4: Min guard;4: Min Psychiatric nurse Details (indicate cue type and reason) Performed  x 6 reps during session at min/guard to min A level (min A to the L) with cues for sequencing, allowing pt to perform as much as possible.  Cues for increased forward weight shift and "swinging hips" to surface.       Ambulation/Gait   Ambulation/Gait Yes   Ambulation/Gait Assistance 3: Mod assist  PT present for safety and cues   Ambulation/Gait Assistance Details Had [name] demonstrate gait with pt to PT during session. Pt ambulated x 50' with LBQC.  Note that pt tends to not "trust" [name] as much as PT.  Discussed in depth during session communication between the two of them as well as repetition in order to get proper mechanics.  Provided  cues and recommendations for hand placement on pt (to husband) on how best to assist and provide improved postural support.     Ambulation Distance (Feet) 50 Feet   Assistive device Large base quad cane   Gait Pattern Step-through pattern;Decreased stance time - left;Decreased hip/knee flexion - left;Decreased dorsiflexion - left;Lateral trunk lean to right;Decreased arm swing - left;Decreased weight shift to left;Narrow base of support;Left circumduction;Left genu recurvatum;Ataxic   Ambulation Surface Level;Indoor     Self-Care   Self-Care Other Self-Care Comments   Other Self-Care Comments  See individual sections regarding extensive education on transfer techniques, safety and repetition. Also educated on working on these things and returning in 3 months.                 PT Education - 08/25/16 1524    Education provided Yes   Education Details see self care   Person(s) Educated Patient;Spouse   Methods Explanation;Demonstration   Comprehension Verbalized understanding;Returned demonstration          PT Short Term Goals - 07/14/16 1840      PT SHORT TERM GOAL #1   Title Pt will initiate HEP in order to indicate improved functional mobility and decreased fall risk.  (Target Date: 06/30/16)   Baseline 07/13/16: met today as HEP was issued today   Status Achieved     PT SHORT TERM GOAL #2   Title Pt will perform stand pivot transfer w/ LRAD at S level in order to increase independence at home.     Baseline 1/301/18: pt continues to need up to min assist both with and without AD (per previous notes for without AD, used AD today)   Status Not Met     PT SHORT TERM GOAL #3   Title Pt will perform bed mobility at mod I level in order to indicate improved functional independence.     Baseline 07/13/16: pt at supervision level mostly, continues to need assistance to lift left LE onto surface and cues on sequencing/technique placing her at supervision   Status Partially Met      PT SHORT TERM GOAL #4   Title Pt will ambulate 100' w/ LRAD at min A level in order to indicate improved household ambulation.     Time 4   Period Weeks   Status Not Met     PT SHORT TERM GOAL #5   Title Pt will perform PASS and improve score 3 points in order to indicate improved functional mobility.     Time 4   Period Weeks   Status On-going           PT Long Term Goals - 08/25/16 1413      PT LONG TERM GOAL #1   Title Pt/spouse will be independent  with HEP in order to indicate improved functional mobility and decreased fall risk.  (Modified Target Date: 08/28/16)   Baseline met 07/28/16   Time 8   Period Weeks   Status Achieved     PT LONG TERM GOAL #2   Title Pt will perform stand pivot transfers w/ LRAD at S level in order to indicate improved functional independence. 08/28/16   Baseline recommend squat pivot transfers that she can perform at min/guard A level 08/25/16   Time 4   Period Weeks   Status Partially Met     PT LONG TERM GOAL #3   Title Pt will ambulate x 100' w/ LRAD at S level over indoor surfaces in order to increase independence with household ambulation.  08/28/16   Baseline has done up to 100' with LBQC at min A level    Time 4   Period Weeks   Status Not Met     PT LONG TERM GOAL #4   Title Pt will perform dynamic standing balance with intermittent UE support x 10 mins at S level in order to increase independence with ADLs. 08/28/16   Baseline Requires constant UE support at min A level to maintain balance   Time 4   Period Weeks   Status Not Met     PT LONG TERM GOAL #5   Title Pt will ambulate over paved outdoor surfaces w/ LRAD x 200' at S level in order to indicate initiation of community ambulation.    Baseline not appropriate at this time   Time 8   Period Weeks   Status Deferred               Plan - 08/25/16 1530    Clinical Impression Statement Pt has met 1/4 LTGs, partially meeting 1 other LTG.  She has made minimal progress  due to medical set backs as well as poor carryover due to cognition and decreased practice with husband.  Encouarged them to continue to work at home and return to therapy in 3 months.     Rehab Potential Good   Clinical Impairments Affecting Rehab Potential pt motivated by may be limited due to medical complexity and poor cognition   PT Frequency 2x / week   PT Duration 4 weeks   PT Treatment/Interventions ADLs/Self Care Home Management;Canalith Repostioning;Electrical Stimulation;DME Instruction;Stair training;Gait training;Functional mobility training;Therapeutic activities;Therapeutic exercise;Balance training;Neuromuscular re-education;Patient/family education;Orthotic Fit/Training;Passive range of motion;Energy conservation;Taping;Vestibular;Visual/perceptual remediation/compensation   PT Next Visit Plan --   Consulted and Agree with Plan of Care Patient       PHYSICAL THERAPY DISCHARGE SUMMARY  Visits from Start of Care: 17  Current functional level related to goals / functional outcomes: See above   Remaining deficits: Continues to have poor cognition which is apparent in her mobility and lack of progress.    Education / Equipment: HEP, see self care in this note  Plan: Patient agrees to discharge.  Patient goals were not met. Patient is being discharged due to                                                     ?????Making minimal progress, giving time to work at home before returning to therapy.            Patient will benefit from skilled therapeutic intervention in order to improve  the following deficits and impairments:  Abnormal gait, Decreased activity tolerance, Decreased balance, Decreased cognition, Decreased coordination, Decreased endurance, Decreased knowledge of precautions, Decreased knowledge of use of DME, Decreased mobility, Decreased safety awareness, Decreased strength, Impaired perceived functional ability, Impaired flexibility, Impaired sensation,  Impaired tone, Impaired UE functional use, Improper body mechanics, Postural dysfunction  Visit Diagnosis: Hemiplegia and hemiparesis following cerebral infarction affecting left non-dominant side (HCC)  Abnormal posture  Muscle weakness (generalized)  Unsteadiness on feet  Other abnormalities of gait and mobility     Problem List Patient Active Problem List   Diagnosis Date Noted  . 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 Orthostatic hypotension 04/08/2016  . Muscle cramps 04/08/2016  . Right pontine cerebrovascular accident ([place]) 04/08/2016  . Spastic hemiparesis of left nondominant side ([place]) 04/08/2016  . Cerebral thrombosis with cerebral infarction 04/06/2016  . Basilar artery stenosis   . Dysarthria 04/05/2016  . History of CVA with residual deficit 04/05/2016  . Sinus tachycardia 04/05/2016  . Acute encephalopathy 04/05/2016  . Chronic pain 04/05/2016  . Other hyperlipidemia 04/05/2016  . Ischemic stroke ([place])   . 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 TIA (transient ischemic attack) 11/07/2014  . Uncontrolled hypertension 11/07/2014  . Controlled type 2 diabetes mellitus with diabetic nephropathy ([place]) 11/07/2014   Cameron Sprang, PT, MPT [facility] [address] [place], [place], [address] Phone: [phone]   Fax:  [phone] 08/25/16, 4:27 PM  Name: Shelly Mcdaniel MRN: [id number] Date of Birth: Oct 20, 1955

## 2016-08-25 NOTE — Therapy (Signed)
[facility] [address] [place], [place], [address] Phone: [phone]   Fax:  [phone]  Occupational Therapy Treatment  Patient Details  Name: Shelly Mcdaniel MRN: [id number] Date of Birth: [birth year] Referring Provider: Dr. Alysia Penna  Encounter Date: 08/25/2016      OT End of Session - 08/25/16 1708    Visit Number 18   Number of Visits 25   Date for OT Re-Evaluation 09/30/16   Authorization Type BC/BS   OT Start Time 1449   OT Stop Time 1527   OT Time Calculation (min) 38 min   Activity Tolerance Patient tolerated treatment well   Behavior During Therapy [facility] for tasks assessed/performed      Past Medical History:  Diagnosis Date  . Diabetes mellitus without complication ([place])   . Hyperlipidemia   . Hypertension   . Stroke ([place])   . UTI (urinary tract infection)     Past Surgical History:  Procedure Laterality Date  . BACK SURGERY    . BREAST SURGERY    . COLONOSCOPY WITH PROPOFOL N/A 12/24/2013   Procedure: COLONOSCOPY WITH PROPOFOL;  Surgeon: [name], MD;  Location: [facility];  Service: Endoscopy;  Laterality: N/A;    There were no vitals filed for this visit.      Subjective Assessment - 08/25/16 1458    Subjective  They think I had a seizure - even though that's not common   Patient is accompained by: Family member   Pertinent History see Epic, Dr Erlinda Hong has approved DBP less than 110 and HR 115-120 for therapy parameters.    Limitations elevated BP and orthostatic hypotension at times, monitor BP   Patient Stated Goals to be more indpendent   Currently in Pain? No/denies   Pain Score 0-No pain                      OT Treatments/Exercises (OP) - 08/25/16 1701      ADLs   UB Dressing Reviewed patient's ability to dress her upper body with min assist in the clinic.  Hvae reviewed this in the past with patient and husband.  Husband admits he "helps too much"   Reviewed options to allow patient to be more independent with dressing - patiet and husband in agreement.     Toileting Patient has demonstrated safe and effective transfers to right side without physical assistance, and intermittent min assist toward left for bed to commode transfers.  Patient has just been sick (GI Issue) and recently in the ED with possible seizure.  Patient's medical stability seems to fluctuate which then impeded consistent performance with ADL / functional mobility.    Bathing Patient is able to bath upper body with min assist but has not done so in at least the last two weeks.  Reviewed again with patient and husband that she will only gain increased independnece if allowed to practice outside of therapy sessions.     ADL Comments Reviewed goals, and plan for discharge from OT at this time.  Discussed the option of retunring in 12 weeks if indicated.  Reviewed need for MD referral for return to therapy.  Discussed the benefit of establishing routines with basic self care skills that would increase particiaption and ultimately independence.      Neurological Re-education Exercises   Other Exercises 1 Patient has had chronic shoulder pain, which at this time is fairly well managed.  Reviewed with patient and husband that  two priorities were 1)  provide support at all times for left arm (in wheelchair, in bed) and 2) to maintain current range of motion - daily passive motion program.                  OT Education - 08/25/16 1708    Education provided Yes   Education Details reveiwed goals, plan for OT discharge, and return in 12 weeks   Person(s) Educated Patient;Spouse   Methods Explanation;Demonstration   Comprehension Verbalized understanding          OT Short Term Goals - 08/25/16 1711      OT SHORT TERM GOAL #1   Title Pt/family independent with initial HEP for LUE (due 06/29/16)   Status Achieved     OT SHORT TERM GOAL #2   Title Pt/family to verbalize  understanding with proper positioning of LUE during activity and in bed to prevent pain including use of sling /splint prn   Status Achieved     OT SHORT TERM GOAL #3   Title Pt to perform UB dressing with min assist using hemi techniques   Status Partially Met  Has done in clinic, is not doing at home     OT [place] #4   Title Pt will donn pants with mod A.   Status Achieved     OT SHORT TERM GOAL #5   Title Pt will use LUE as a stabilizer with min A 25 % of the time for ADLS/ functional activity   Status Not Met     OT SHORT TERM GOAL #6   Title Pt will perform bathing with min A for UB and mod A for LB   Status Not Met           OT Long Term Goals - 08/25/16 1711      OT LONG TERM GOAL #1   Title Independent with updated HEP prn (all LTG's due 07/30/15)   Status Partially Met  Husband has demonstrated effective technique in clinic - these exercises do not routinely happen at home.       OT LONG TERM GOAL #2   Title Pt to demo active low range sh. flexion to 25 degrees in prep for reaching and use of arm as stabilizer   Status Not Met     OT LONG TERM GOAL #3   Title Pt to demo 25% gross finger flexion/extension to grasp/release cylindrical objects   Status Not Met     OT LONG TERM GOAL #4   Title Pt will use LUE as a stabilizer for ADLs with min v.c. and pain less than or equal to 3/10   Status Not Met     OT LONG TERM GOAL #5   Title Pt will perfrom toilet and shower transfers with min A.   Status Achieved               Plan - 08/25/16 1709    Clinical Impression Statement Patient tearful regarding her current state, although she and her husband undersatnd plan for discharge from OT at this time.  Patient has potential for further functional improvement, but needs time and space to practice ADL skills routinely at home.     Rehab Potential Fair   OT Frequency 2x / week   OT Duration 8 weeks   OT Treatment/Interventions Self-care/ADL  training;Moist Heat;DME and/or AE instruction;Splinting;Patient/family education;Therapeutic exercises;Compression bandaging;Therapeutic activities;Neuromuscular education;Functional Mobility Training;Passive range of motion;Cognitive remediation/compensation;Electrical Stimulation;Manual Therapy;Dry needling;Visual/perceptual remediation/compensation  Plan discharge from [place] and Agree with Plan of Care Patient;Family member/caregiver   Family Member Consulted Husband [name]      Patient will benefit from skilled therapeutic intervention in order to improve the following deficits and impairments:  Decreased activity tolerance, Decreased balance, Decreased cognition, Decreased knowledge of use of DME, Decreased coordination, Decreased endurance, Decreased range of motion, Decreased safety awareness, Decreased skin integrity, Decreased strength, Impaired perceived functional ability, Increased edema, Impaired sensation, Impaired tone, Impaired UE functional use, Impaired vision/preception, Pain, Improper body mechanics, Decreased mobility, Abnormal gait, Difficulty walking, Decreased knowledge of precautions  Visit Diagnosis: Hemiplegia and hemiparesis following cerebral infarction affecting left non-dominant side (HCC)  Abnormal posture  Muscle weakness (generalized)  Unsteadiness on feet  Visuospatial deficit  Other lack of coordination  Acute pain of left shoulder  Cognitive social or emotional deficit following cerebral infarction    Problem List Patient Active Problem List   Diagnosis Date Noted  . 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 Orthostatic hypotension 04/08/2016  . Muscle cramps 04/08/2016  . Right pontine cerebrovascular accident ([place]) 04/08/2016  . Spastic hemiparesis of left nondominant side ([place]) 04/08/2016  . Cerebral thrombosis with cerebral infarction 04/06/2016  . Basilar artery stenosis   . Dysarthria 04/05/2016  . History of CVA with  residual deficit 04/05/2016  . Sinus tachycardia 04/05/2016  . Acute encephalopathy 04/05/2016  . Chronic pain 04/05/2016  . Other hyperlipidemia 04/05/2016  . Ischemic stroke ([place])   . 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 TIA (transient ischemic attack) 11/07/2014  . Uncontrolled hypertension 11/07/2014  . Controlled type 2 diabetes mellitus with diabetic nephropathy ([place]) 11/07/2014   OCCUPATIONAL THERAPY DISCHARGE SUMMARY  Visits from Start of Care: 18  Current functional level related to goals / functional outcomes: Patient has shown significant improvement in sitting and static standing balance, and improvement related to level surface transfers.  Patient is not actively engaged in ADL tasks at home due to level of care required, time constraints, frustration tolerance, etc.     Remaining deficits: Dense left hemiplegia, shoulder pain, attention / impulsivity / awareness . Insight, somatosensory loss   Education / Equipment: Home exercise program for passive range of motion,  Home set up for optimal transfers Plan: Patient agrees to discharge.  Patient goals were partially met. Patient is being discharged due to                                                     ?????  Patient has made functional improvements related to standing balance, but this has not carried over to ADL tasks outside of the clinic.  Patient and husband need time to implement therapeutic processes into daily life.  Patient also has several anticipated medical appointments coming up.  Discussed return to therapy in 12 weeks.     Mariah Milling, OTR/L 08/25/2016, 5:14 PM  [facility] [address] [place], [place], [address] Phone: [phone]   Fax:  [phone]  Name: Shelly Mcdaniel MRN: [id number] Date of Birth: May 27, 1956

## 2016-08-25 NOTE — Patient Instructions (Signed)
Discontinue meds that lower seizure threshold Sertraline Ritalin  No baclofen or botox recommended

## 2016-08-25 NOTE — Progress Notes (Signed)
Subjective:    Patient ID: Shelly SimmerJody D Mcdaniel, female    DOB: 07-10-1955, 61 y.o.   MRN: [id number]  HPI   In ED for a staring spell yesterday, CT head no new infarct UA -, BMET, CBC  Off lyrica no increased sensitivityReported in the left arm Still taking ritalin Stopped sertraline yesterday  Prn Zofran for nausea/dry heaves Takes baclofen usually once a day but took 30mg  yesterday UA neg   Uses AFO and quad cane to ambulate SBA Pain Inventory Average Pain 4 Pain Right Now 2 My pain is dull and aching  In the last 24 hours, has pain interfered with the following? General activity 1 Relation with others 0 Enjoyment of life 0 What TIME of day is your pain at its worst? morning Sleep (in general) Good  Pain is worse with: unsure Pain improves with: therapy/exercise and medication Relief from Meds: 7  Mobility walk with assistance use a cane how many minutes can you walk? 3 ability to climb steps?  no do you drive?  no use a wheelchair needs help with transfers  Function not employed: date last employed . I need assistance with the following:  feeding, dressing, bathing, toileting, meal prep, household duties and shopping  Neuro/Psych bladder control problems weakness trouble walking spasms dizziness confusion  Prior Studies Any changes since last visit?  no CT/MRI  Physicians involved in your care Any changes since last visit?  no   Family History  Problem Relation Age of Onset  . Stroke Mother   . Dementia Mother   . Diabetes Father   . Stroke Father   . CAD Father   . Diabetes Sister   . Diabetes Brother   . CAD Brother   . Stroke Brother   . Kidney failure Brother   . Hypertension Other   . Hyperlipidemia Other   . Stroke Other   . Heart disease Other   . Diabetes Other    Social History   Social History  . Marital status: Married    Spouse name: N/A  . Number of children: N/A  . Years of education: N/A   Social History Main  Topics  . Smoking status: Never Smoker  . Smokeless tobacco: Never Used  . Alcohol use No  . Drug use: No  . Sexual activity: Not Asked   Other Topics Concern  . None   Social History Narrative  . None   Past Surgical History:  Procedure Laterality Date  . BACK SURGERY    . BREAST SURGERY    . COLONOSCOPY WITH PROPOFOL N/A 12/24/2013   Procedure: COLONOSCOPY WITH PROPOFOL;  Surgeon: Charolett BumpersMartin K Johnson, MD;  Location: [facility];  Service: Endoscopy;  Laterality: N/A;   Past Medical History:  Diagnosis Date  . Diabetes mellitus without complication (HCC)   . Hyperlipidemia   . Hypertension   . Stroke (HCC)   . UTI (urinary tract infection)    BP (!) 160/100   Pulse 93   Resp 14   SpO2 97%   Opioid Risk Score:   Fall Risk Score:  `1  Depression screen PHQ 2/9  [facility] 07/14/2016 05/12/2016 01/15/2016 08/20/2015 05/27/2015  Decreased Interest 0 3 0 0 0  Down, Depressed, Hopeless 0 0 0 0 0  PHQ - 2 Score 0 3 0 0 0  Altered sleeping - 2 - - -  Tired, decreased energy - 3 - - -  Feeling bad or failure about yourself  -  0 - - -  Trouble concentrating - 3 - - -  Moving slowly or fidgety/restless - 2 - - -  Suicidal thoughts - 0 - - -  PHQ-9 Score - 13 - - -  Difficult doing work/chores - Very difficult - - -  Some recent data might be hidden    Review of Systems  Constitutional: Positive for unexpected weight change.  HENT: Negative.   Eyes: Negative.   Respiratory: Negative.   Cardiovascular: Negative.   Gastrointestinal: Positive for constipation, nausea and vomiting.  Endocrine:       High blood sugar  Genitourinary: Negative.   Musculoskeletal: Positive for gait problem.       Spasms   Skin: Negative.   Allergic/Immunologic: Negative.   Neurological: Positive for dizziness.  Psychiatric/Behavioral: Positive for confusion.  All other systems reviewed and are negative.      Objective:   Physical Exam  Constitutional: She is oriented to  person, place, and time. She appears well-developed and well-nourished.  HENT:  Head: Normocephalic and atraumatic.  Eyes: Pupils are equal, round, and reactive to light.  Neurological: She is alert and oriented to person, place, and time.  Psychiatric: She has a normal mood and affect.  Nursing note and vitals reviewed.   Right upper extremity 5/5 deltoid, biceps, triceps, grip, right lower extremity 4/5 hip flexion, extension, ankle flexion. Left upper extremity, trace biceps. Trace grip. Left lower extremity 3 minus, hip, knee extensor synergy 2 minus at the ankle [place] 2 Right biceps, finger flexor 1 Left shoulder pain with external rotation as well as forward flexion and abduction.    Assessment & Plan:  1. 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 Her tone is overall improved over time do not think she needs repeat Botox injection. Completing outpatient therapy, have reiterated to patient and husband the importance of keeping up with home excess program.  Discontinue meds that lower seizure threshold Sertraline, monitor for signs of increasing anxiety over the next several weeks, if so may need to restart at a lower dose Ritalin-initiation level alertness has improved over the last several months. Do not think she needs to remain on this  Sedation may have ben caused by baclofen but no sig spasticity , may discontinue  Follow-up with neurology X week. Physical medicine and rehabilitation follow-up in 6 weeks  2. Left shoulder pain, adhesive capsulitis, no reinjection or now, we discussed chronic pain.

## 2016-08-29 ENCOUNTER — Encounter: Payer: BC Managed Care – PPO | Admitting: Occupational Therapy

## 2016-08-29 ENCOUNTER — Encounter: Payer: BC Managed Care – PPO | Admitting: Speech Pathology

## 2016-08-31 ENCOUNTER — Encounter: Payer: Self-pay | Admitting: Neurology

## 2016-08-31 ENCOUNTER — Ambulatory Visit: Payer: BC Managed Care – PPO | Admitting: Physical Therapy

## 2016-08-31 ENCOUNTER — Ambulatory Visit (INDEPENDENT_AMBULATORY_CARE_PROVIDER_SITE_OTHER): Payer: BC Managed Care – PPO | Admitting: Neurology

## 2016-08-31 VITALS — BP 160/104 | Wt 131.6 lb

## 2016-08-31 DIAGNOSIS — E1121 Type 2 diabetes mellitus with diabetic nephropathy: Secondary | ICD-10-CM

## 2016-08-31 DIAGNOSIS — I651 Occlusion and stenosis of basilar artery: Secondary | ICD-10-CM | POA: Diagnosis not present

## 2016-08-31 DIAGNOSIS — R471 Dysarthria and anarthria: Secondary | ICD-10-CM

## 2016-08-31 DIAGNOSIS — I6330 Cerebral infarction due to thrombosis of unspecified cerebral artery: Secondary | ICD-10-CM | POA: Diagnosis not present

## 2016-08-31 DIAGNOSIS — E7849 Other hyperlipidemia: Secondary | ICD-10-CM

## 2016-08-31 DIAGNOSIS — I951 Orthostatic hypotension: Secondary | ICD-10-CM

## 2016-08-31 DIAGNOSIS — I6930 Unspecified sequelae of cerebral infarction: Secondary | ICD-10-CM | POA: Diagnosis not present

## 2016-08-31 DIAGNOSIS — E784 Other hyperlipidemia: Secondary | ICD-10-CM

## 2016-08-31 NOTE — Progress Notes (Signed)
STROKE NEUROLOGY FOLLOW UP NOTE  NAME: Shelly Mcdaniel DOB: 07-09-1955  REASON FOR VISIT: stroke follow up HISTORY FROM: husband and pt and chart  Today we had the pleasure of seeing Shelly Mcdaniel in follow-up at our Neurology Clinic. Pt was accompanied by husband.   History Summary Shelly Mcdaniel is a 61 y.o. 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 Shelly Mcdaniel had right pontine stroke in 01/2016 and was [facility]. TEE and CUS unremarkable and Shelly Mcdaniel was discharged with ASA and zocor. Shelly Mcdaniel continued to have left hemiparesis. After admission, stroke work up showed again right pontine and b/l cerebellar peduncle infarcts. 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 EEG generalized slowing and no seizure. LDL 110, A1C 6.7. Shelly Mcdaniel was put on DAPT and crestor. Shelly Mcdaniel was also found to have orthostatic hypotension. Put on TED hose and BP goal 130-150. Shelly Mcdaniel was later discharged to CIR.    05/23/16 follow up - the patient has been doing the same. Shelly Mcdaniel is now home. Shelly Mcdaniel was initially improving on the left hemiparesis but recently developed continued UTI even with antibiotics. As per son, all Shelly Mcdaniel improvement has been disappeared due to UTI. Shelly Mcdaniel BP at home lying about 150-170, sitting 130s and standing would drop to 100s. Today in clinic BP 123/85  07/20/16 follow up - pt has been doing better. Shelly Mcdaniel had at least UTI x 2, on cipro and recently referred to urology and pending further work up with CT or ultrasound. Fortunately, no sepsis either time. Shelly Mcdaniel continued to work with outpt PT/OT and Shelly Mcdaniel left LE much improved on strength, however, LUE still flaccid. Working with PT/OT still has intermittent orthostatic hypotension which sometimes aborted Shelly Mcdaniel rehab session. However, as per husband, pt BP at home sitting 130/90, lying 150s and standing lower  than 130 but not remeber exact number. Shelly Mcdaniel is on metoprolol 25mg  daily. As per husband, daughter who is [place]hesi[place] [facility] [place] would like pt to have low dose lisinopril for Shelly Mcdaniel cardiac and renal protection. BP today in clinic sitting 146/93.   Interval History During the interval time, pt has one episode of staring spells on 08/24/16 lasting about 5min, no shaking jerking or b/b incontinence at that time. Did not check BP. Shelly Mcdaniel was sent to ED where Shelly Mcdaniel was back to Shelly Mcdaniel baseline. Pt can not remember the event. CT head no bleeding. UA negative and CBC BMP normal except K 3.2. Shelly Mcdaniel was not put on AEDs at that time.   Shelly Mcdaniel was recently discharged from PT/OT and followed with Dr. Wynn BankerKirsteins who does not think pt needs ongoing botox or ritalin treatment. Still has left hemiplegia. BP today 160/104, however, stated Shelly Mcdaniel BP at home or other doctor's office always 120-140s. Currently at home, no symptoms with sitting or standing. Working on transition and walking with quad cane at home currently. Pt decreased baclofen and zoloft dose and pt seems more awake alert today in clinic. DM and glucose better controlled. Glucose today 108.   REVIEW OF SYSTEMS: Full 14 system review of systems performed and notable only for those listed below and in HPI above, all others are negative:  Constitutional:  fatigue Cardiovascular:  Ear/Nose/Throat:   Skin:  Eyes:   Respiratory:   Gastroitestinal:  Genitourinary:  Hematology/Lymphatic:   Endocrine:  Musculoskeletal:  Allergy/Immunology:  Neurological:   Psychiatric:  Sleep:   The following represents the patient's updated allergies and side effects list: Allergies  Allergen Reactions  . Bee Venom Anaphylaxis  . Codeine Itching  . Lipitor [Atorvastatin] Other (See Comments)    Cramping   . Septra [Sulfamethoxazole-Trimethoprim] Other (See Comments)    Mother died from Stevens-Johnson syndrome and sister had anaphylaxis from Septra.Physician told Shelly Mcdaniel  never to take this medication    The neurologically relevant items on the patient's problem list were reviewed on today's visit.  Neurologic Examination  A problem focused neurological exam (12 or more points of the single system neurologic examination, vital signs counts as 1 point, cranial nerves count for 8 points) was performed.  Blood pressure (!) 160/104, weight 131 lb 9.6 oz (59.7 kg).  General - Well nourished, well developed, in no apparent distress.  Ophthalmologic - Fundi not visualized due to eye movement.  Cardiovascular - Regular rate and rhythm.  Mental Status -  Level of arousal and orientation to time, place and person were intact. Language exam showed mild wording finding difficulty and slow speech, mild dysarthria, but intact naming, repetition, comprehension.  Cranial Nerves II - XII - II - Visual field intact OU. III, IV, VI - Extraocular movements intact. V - Facial sensation intact bilaterally. VII - left facial droop. VIII - Hearing & vestibular intact bilaterally. X - Palate elevates symmetrically, mild dysarthria. XI - Chin turning & shoulder shrug intact bilaterally. XII - Tongue protrusion intact.  Motor Strength - The patient's strength was 5/5 RUE and RLE, but 0/5 LUE and 4/5 LLE proximal but 2/5 DF and PF with left LE brace. Bulk was normal and fasciculations were absent.   Motor Tone - Muscle tone was assessed at the neck and appendages and was mildly increased LUE.  Reflexes - The patient's reflexes were 1+ in all extremities and Shelly Mcdaniel had no pathological reflexes.  Sensory - Light touch, temperature/pinprick were assessed and were symmetrical.    Coordination - The patient had normal movements in the right hand with no ataxia or dysmetria.  Tremor was absent.  Gait and Station - in wheelchair, not tested.    Data reviewed: I personally reviewed the images and agree with the radiology interpretations.  Dg Chest 2  View 04/05/2016 No radiographic evidence of acute cardiopulmonary disease.   Ct Head Wo Contrast 04/05/2016 1. No acute intracranial findings.  2. 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   Mr Brain 50 Contrast Mr Maxine Glenn Head Wo Contrast 04/05/2016 1. Foci of diffusion restriction within the right hemipons and abnormal signal in the middle cerebellar peduncles probably represent areas of acute/early subacute ischemia. No hemorrhage identified.  2. 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  3. Otherwise the circle of Willis is patent with atherosclerotic changes and short segments of mild stenosis in the anterior and posterior circulation without large vessel occlusion or aneurysm.  4. Paranasal sinus disease predominantly in anterior ethmoid and right frontal sinuses.  5. Stable moderate chronic microvascular ischemic changes of the brain parenchyma.   EEG This is an abnormal EEG due to the presence of mild diffuse generalized slowing with greater intermixed bifrontal slowing. This pattern is nonspecific and consistent with a global encephalopathic process, nonspecific as to etiology. There is no evidence of seizure on the study.  CUS 01/17/2016 - bilateral plaques, but no hemodynamically significant stenosis  TTE - 01/17/16 - mild diastolic dysfunction with borderline left ventricular hypertrophy with a trace  amount of mital regurgitation, trace amount of aortic regurgitation, trace amount of tricuspid regurgitation.  MRI brain 01/17/16 - atrophy and chronic micro-vascular ischemic changes. There is restricted diffusion on the right side of the pons consistent with an acute infarct.   MRI 05/13/16 1. Limited motion degraded brain MRI consisting of diffusion and FLAIR sequences. 2. No new abnormality compared to 04/05/2016. 3. Diffusion hyperintensity from the recent right pontine infarct has resolved as expected, but symmetric hyperintensity  persists in the middle cerebellar peduncles. Favor acute Wallerian degeneration which can have this pattern with pontine infarct. Toxic/metabolic derangement, usually described with liver disease or hypoglycemia, or hypertensive complication are alternate considerations.  CT head 08/24/16 -  Old right brainstem infarct. No acute intracranial abnormality. Chronic microvascular disease.  Component     Latest Ref Rng & Units 04/05/2016 04/07/2016  Cholesterol     0 - 200 mg/dL  409  Triglycerides     <150 mg/dL  811  HDL Cholesterol     >40 mg/dL  29 (L)  Total CHOL/HDL Ratio     RATIO  5.7  VLDL     0 - 40 mg/dL  25  LDL (calc)     0 - 99 mg/dL  914 (H)  Hemoglobin N8G     4.8 - 5.6 % 6.7 (H)   Mean Plasma Glucose     mg/dL 956   Ammonia     9 - 35 umol/L 14   Vitamin B12     180 - 914 pg/mL  320  Folate     >5.9 ng/mL  21.7    Assessment: As you may recall, Shelly Mcdaniel is a 61 y.o. 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 TTE and CUS in 01/2016 unremarkable, EEG generalized slowing and no seizure. LDL 110, A1C 6.7. Shelly Mcdaniel was put on DAPT and crestor. Shelly Mcdaniel was also found to have orthostatic hypotension. Put on TED hose and BP goal 130-150. Shelly Mcdaniel was later discharged to CIR and then home. Shelly Mcdaniel was initially improving on the left hemiparesis but recently worsened after developed continued UTI even with antibiotics. Still has orthostatic hypotension at home and pt does not like compression socks. Over time, Shelly Mcdaniel condition stabilized, working with PT/OT and LLE strength much improved. Has frequent UTI and following with urology. Orthostatic hypotension improved. Not tolerating crestor and now on zocor. Had one episode of "staring spells" on 08/19/16, resolved  quickly, no AED now. Repeat CTA head and neck.   Plan:  - continue plavix and zocor for stroke prevention - check BP at home and record. Check orthostatic BPs periodically at home. - if staring spells recurrent, will need to repeat EEG and consider seizure meds.  - Follow up with your primary care physician for stroke risk factor modification. Recommend maintain blood pressure goal 130-150/70-90, diabetes with hemoglobin A1c goal below 7.0% and lipids with LDL cholesterol goal below 70 mg/dL.  - keep hydrated and avoid dehydration  - aggressive home exercise - will check CTA head and neck to follow up with basilar artery stenosis - follow up in 4 months with me.    I spent more than 25 minutes of face to face time with the patient. Greater than 50% of time was spent in  counseling and coordination of care. We discussed BP management, medication recommendations, seizure precautions, and repeat CTA head and neck.   Orders Placed This Encounter  Procedures  . CT ANGIO HEAD W OR WO CONTRAST    Standing Status:   Future    Standing Expiration Date:   10/31/2017    Order Specific Question:   If indicated for the ordered procedure, I authorize the administration of contrast media per Radiology protocol    Answer:   Yes    Order Specific Question:   Reason for Exam (SYMPTOM  OR DIAGNOSIS REQUIRED)    Answer:   basilar artery stenosis    Order Specific Question:   Is patient pregnant?    Answer:   No    Order Specific Question:   Preferred imaging location?    Answer:   Internal    Order Specific Question:   Radiology Contrast Protocol - do NOT remove file path    Answer:   \\[email or web address]  . CT ANGIO NECK W OR WO CONTRAST    Standing Status:   Future    Standing Expiration Date:   10/31/2017    Order Specific Question:   If indicated for the ordered procedure, I authorize the administration of contrast media per Radiology protocol    Answer:   Yes    Order  Specific Question:   Reason for Exam (SYMPTOM  OR DIAGNOSIS REQUIRED)    Answer:   basilar artery stenosis    Order Specific Question:   Is patient pregnant?    Answer:   No    Order Specific Question:   Preferred imaging location?    Answer:   Internal    Order Specific Question:   Radiology Contrast Protocol - do NOT remove file path    Answer:   \\[email or web address]    Meds ordered this encounter  Medications  . Phenazopyridine HCl (AZO TABS PO)    Sig: Take by mouth.    Patient Instructions  - continue ASA 81mg  an plavix and zocor for stroke prevention - check BP daily and record. Check orthostatic BP 2 times per week for monitoring - if staring spells recurrent, we may need to repeat EEG and consider seizure meds.  - Follow up with your primary care physician for stroke risk factor modification. Recommend maintain blood pressure goal 130-150/70-90, diabetes with hemoglobin A1c goal below 7.0% and lipids with LDL cholesterol goal below 70 mg/dL.  - keep hydrated and avoid dehydration  - continue outpt PT/OT - will check CTA head and neck to follow up with basilar artery stenosis - follow up in 4 months with me.     Marvel Plan, MD PhD [facility] [address], [address] [place], [place] [address] [phone]

## 2016-08-31 NOTE — Patient Instructions (Addendum)
-   continue ASA 81mg  an plavix and zocor for stroke prevention - check BP daily and record. Check orthostatic BP 2 times per week for monitoring - if staring spells recurrent, we may need to repeat EEG and consider seizure meds.  - Follow up with your primary care physician for stroke risk factor modification. Recommend maintain blood pressure goal 130-150/70-90, diabetes with hemoglobin A1c goal below 7.0% and lipids with LDL cholesterol goal below 70 mg/dL.  - keep hydrated and avoid dehydration  - continue outpt PT/OT - will check CTA head and neck to follow up with basilar artery stenosis - follow up in 4 months with me.

## 2016-09-01 ENCOUNTER — Ambulatory Visit: Payer: BC Managed Care – PPO | Admitting: Rehabilitation

## 2016-09-01 ENCOUNTER — Telehealth: Payer: Self-pay | Admitting: Neurology

## 2016-09-01 ENCOUNTER — Encounter: Payer: BC Managed Care – PPO | Admitting: Occupational Therapy

## 2016-09-01 NOTE — Telephone Encounter (Signed)
Noted, thank you for your help!  °

## 2016-09-01 NOTE — Telephone Encounter (Signed)
I called peer to peer and the company said the CTA head and neck has been approved. The # is [id number], from 09/01/16 to 09/30/16. Thanks.   Marvel PlanJindong Khaliah Barnick, MD PhD Stroke Neurology 09/01/2016 1:49 PM

## 2016-09-01 NOTE — Telephone Encounter (Signed)
[employer] didn't approve the Ct's requiring more clinical information.. The phone number for the peer to peer is [phone]. The member ID is [id number] & DOB is 01-19-1956.. Thank you for your help!

## 2016-09-05 ENCOUNTER — Ambulatory Visit: Payer: BC Managed Care – PPO | Admitting: Physical Therapy

## 2016-09-05 ENCOUNTER — Encounter: Payer: BC Managed Care – PPO | Admitting: Occupational Therapy

## 2016-09-07 ENCOUNTER — Ambulatory Visit
Admission: RE | Admit: 2016-09-07 | Discharge: 2016-09-07 | Disposition: A | Discharge status: Home / Self Care | Payer: BC Managed Care – PPO | Source: Ambulatory Visit | Attending: Neurology | Admitting: Neurology

## 2016-09-07 ENCOUNTER — Encounter: Payer: BC Managed Care – PPO | Admitting: Occupational Therapy

## 2016-09-07 ENCOUNTER — Inpatient Hospital Stay: Admission: RE | Admit: 2016-09-07 | Payer: BC Managed Care – PPO | Source: Ambulatory Visit

## 2016-09-07 ENCOUNTER — Other Ambulatory Visit: Payer: BC Managed Care – PPO

## 2016-09-07 ENCOUNTER — Ambulatory Visit: Payer: BC Managed Care – PPO | Admitting: Physical Therapy

## 2016-09-07 DIAGNOSIS — I651 Occlusion and stenosis of basilar artery: Secondary | ICD-10-CM

## 2016-09-07 MED ORDER — IOPAMIDOL (ISOVUE-370) INJECTION 76%
100.0000 mL | Freq: Once | INTRAVENOUS | Status: AC | PRN
  Administered 2016-09-07: 100 mL via INTRAVENOUS

## 2016-09-08 ENCOUNTER — Telehealth: Payer: Self-pay

## 2016-09-08 ENCOUNTER — Encounter: Payer: Self-pay | Admitting: Neurology

## 2016-09-08 NOTE — Telephone Encounter (Signed)
Left vm for patients husband to call back during business hours for results of Ct scan.

## 2016-09-08 NOTE — Telephone Encounter (Signed)
-----   Message from Marvel PlanJindong Xu, MD sent at 09/08/2016  4:07 PM EDT ----- Could you please let the patient know that the CT head and neck vessel test done recently should stable basilar artery stenosis, no progression from 03/2016 and seems some improvement, but off course in 03/2016 we did MRA not CTA, but at least not worse than before which is good news. Please continue current treatment. Thanks.  Marvel PlanJindong Xu, MD PhD Stroke Neurology 09/08/2016 4:07 PM

## 2016-09-09 NOTE — Telephone Encounter (Signed)
Rn call patients husband to give results. Rn stated per Dr. Roda Shutters note  The  CT head and neck vessel test done recently should stable basilar artery stenosis, no progression from 03/2016 and seems some improvement, but off course in 03/2016 we did MRA not CTA, but at least not worse than before which is good news. Please continue current treatment. Thanks. Pts husband verbalized understanding. He stated his daughter saw it on mychart.

## 2016-10-06 ENCOUNTER — Ambulatory Visit (HOSPITAL_BASED_OUTPATIENT_CLINIC_OR_DEPARTMENT_OTHER): Payer: BC Managed Care – PPO | Admitting: Physical Medicine & Rehabilitation

## 2016-10-06 ENCOUNTER — Encounter: Payer: Self-pay | Admitting: Physical Medicine & Rehabilitation

## 2016-10-06 ENCOUNTER — Encounter: Payer: BC Managed Care – PPO | Attending: Physical Medicine & Rehabilitation

## 2016-10-06 VITALS — BP 162/102 | HR 97

## 2016-10-06 DIAGNOSIS — I10 Essential (primary) hypertension: Secondary | ICD-10-CM | POA: Insufficient documentation

## 2016-10-06 DIAGNOSIS — I69354 Hemiplegia and hemiparesis following cerebral infarction affecting left non-dominant side: Secondary | ICD-10-CM | POA: Insufficient documentation

## 2016-10-06 DIAGNOSIS — R4182 Altered mental status, unspecified: Secondary | ICD-10-CM | POA: Diagnosis not present

## 2016-10-06 DIAGNOSIS — E1140 Type 2 diabetes mellitus with diabetic neuropathy, unspecified: Secondary | ICD-10-CM | POA: Insufficient documentation

## 2016-10-06 DIAGNOSIS — M7502 Adhesive capsulitis of left shoulder: Secondary | ICD-10-CM | POA: Diagnosis not present

## 2016-10-06 DIAGNOSIS — Z794 Long term (current) use of insulin: Secondary | ICD-10-CM | POA: Diagnosis not present

## 2016-10-06 DIAGNOSIS — G8114 Spastic hemiplegia affecting left nondominant side: Secondary | ICD-10-CM

## 2016-10-06 DIAGNOSIS — E785 Hyperlipidemia, unspecified: Secondary | ICD-10-CM | POA: Diagnosis not present

## 2016-10-06 DIAGNOSIS — I651 Occlusion and stenosis of basilar artery: Secondary | ICD-10-CM | POA: Insufficient documentation

## 2016-10-06 NOTE — Progress Notes (Signed)
Subjective:    Patient ID: Shelly Mcdaniel, female    DOB: [birth year], 61 y.o.   MRN: [id number]  HPI Had stomach virus with nausea and vomiting for about a week Also had recurrent UTI  Has seen Dr Pete Glatter ~3wks ago, Bowels doing better  Left hip pain when laying on that side Left shoulder pain when ROM Left hand getting tighter per husband Weaker since infections  Splint not being worn at night, uncomfortable  Pain Inventory Average Pain 5 Pain Right Now 3 My pain is .  In the last 24 hours, has pain interfered with the following? General activity 5 Relation with others 5 Enjoyment of life 5 What TIME of day is your pain at its worst? daytime Sleep (in general) Good  Pain is worse with: bending Pain improves with: . Relief from Meds: .  Mobility walk with assistance use a walker ability to climb steps?  yes use a wheelchair needs help with transfers  Function retired  Neuro/Psych bladder control problems  Prior Studies Any changes since last visit?  no  Physicians involved in your care Any changes since last visit?  no   Family History  Problem Relation Age of Onset  . Stroke Mother   . Dementia Mother   . Diabetes Father   . Stroke Father   . CAD Father   . Diabetes Sister   . Diabetes Brother   . CAD Brother   . Stroke Brother   . Kidney failure Brother   . Hypertension Other   . Hyperlipidemia Other   . Stroke Other   . Heart disease Other   . Diabetes Other    Social History   Social History  . Marital status: Married    Spouse name: N/A  . Number of children: N/A  . Years of education: N/A   Social History Main Topics  . Smoking status: Never Smoker  . Smokeless tobacco: Never Used  . Alcohol use No  . Drug use: No  . Sexual activity: Not on file   Other Topics Concern  . Not on file   Social History Narrative  . No narrative on file   Past Surgical History:  Procedure Laterality Date  . BACK SURGERY    . BREAST  SURGERY    . COLONOSCOPY WITH PROPOFOL N/A 12/24/2013   Procedure: COLONOSCOPY WITH PROPOFOL;  Surgeon: Charolett Bumpers, MD;  Location: [facility];  Service: Endoscopy;  Laterality: N/A;   Past Medical History:  Diagnosis Date  . Diabetes mellitus without complication (HCC)   . Hyperlipidemia   . Hypertension   . Stroke (HCC)   . UTI (urinary tract infection)    There were no vitals taken for this visit.  Opioid Risk Score:   Fall Risk Score:  `1  Depression screen PHQ 2/9  Depression screen [facility] 2/9 07/14/2016 05/12/2016 01/15/2016 08/20/2015 05/27/2015  Decreased Interest 0 3 0 0 0  Down, Depressed, Hopeless 0 0 0 0 0  PHQ - 2 Score 0 3 0 0 0  Altered sleeping - 2 - - -  Tired, decreased energy - 3 - - -  Feeling bad or failure about yourself  - 0 - - -  Trouble concentrating - 3 - - -  Moving slowly or fidgety/restless - 2 - - -  Suicidal thoughts - 0 - - -  PHQ-9 Score - 13 - - -  Difficult doing work/chores - Very difficult - - -  Some recent data  might be hidden    Review of Systems  Constitutional: Negative.   HENT: Negative.   Eyes: Negative.   Respiratory: Negative.   Cardiovascular: Negative.   Gastrointestinal: Negative.   Endocrine: Negative.   Genitourinary: Positive for dysuria.  Musculoskeletal: Negative.   Skin: Negative.   Allergic/Immunologic: Negative.   Neurological: Negative.   Hematological: Negative.   Psychiatric/Behavioral: Negative.   All other systems reviewed and are negative.      Objective:   Physical Exam  Constitutional: She is oriented to person, place, and time. She appears well-developed and well-nourished.  HENT:  Head: Normocephalic and atraumatic.  Eyes: Conjunctivae are normal. Pupils are equal, round, and reactive to light.  Neck: Normal range of motion.  Neurological: She is alert and oriented to person, place, and time.  Psychiatric: She has a normal mood and affect.  Nursing note and vitals reviewed.   Pain with  external rotation. Left shoulder. Motor strength is 2 minus at the deltoid, 3 minus at the biceps 2 minus at the triceps, 2 minus at the finger flexors. Left lower extremity is 3 minus at the hip flexor, knee extensor, 2 minus at the ankle dorsiflexor. Tone is Ashworth grade 2-3 in the finger flexors-with 2 at the wrist flexors, Ashworth 2 at the biceps.       Assessment & Plan:  1. 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 Left upper extremity tone has gradually increased. Last Botox injection to 07/14/2016 FDS 25 FDP 25 FPL 25 PT 25 Repeat next available not before 10/11/2016  2. Left shoulder adhesive capsulitis. Still has soreness in the shoulder. We'll resume therapy and if this persists, we'll repeat glenohumeral injection, which was last performed greater than 3 months ago   3. Debility after gastroenteritis as well as UTI. We will resume PT OT

## 2016-10-06 NOTE — Patient Instructions (Signed)
Trochanteric Bursitis Trochanteric bursitis is a condition that causes hip pain. Trochanteric bursitis happens when fluid-filled sacs (bursae) in the hip get irritated. Normally these sacs absorb shock and help strong bands of tissue (tendons) in your hip glide smoothly over each other and over your hip bones. What are the causes? This condition results from increased friction between the hip bones and the tendons that go over them. This condition can happen if you:  Have weak hips.  Use your hip muscles too much (overuse).  Get hit in the hip. What increases the risk? This condition is more likely to develop in:  Women.  Adults who are middle-aged or older.  People with arthritis or a spinal condition.  People with weak buttocks muscles (gluteal muscles).  People who have one leg that is shorter than the other.  People who participate in certain kinds of athletic activities, such as:  Running sports, especially long-distance running.  Contact sports, like football or martial arts.  Sports in which falls may occur, like skiing. What are the signs or symptoms? The main symptom of this condition is pain and tenderness over the point of your hip. The pain may be:  Sharp and intense.  Dull and achy.  Felt on the outside of your thigh. It may increase when you:  Lie on your side.  Walk or run.  Go up on stairs.  Sit.  Stand up after sitting.  Stand for long periods of time. How is this diagnosed? This condition may be diagnosed based on:  Your symptoms.  Your medical history.  A physical exam.  Imaging tests, such as:  X-rays to check your bones.  An MRI or ultrasound to check your tendons and muscles. During your physical exam, your health care provider will check the movement and strength of your hip. He or she may press on the point of your hip to check for pain. How is this treated? This condition may be treated by:  Resting.  Reducing your  activity.  Avoiding activities that cause pain.  Using crutches, a cane, or a walker to decrease the strain on your hip.  Taking medicine to help with swelling.  Having medicine injected into the bursae to help with swelling.  Using ice, heat, and massage therapy for pain relief.  Physical therapy exercises for strength and flexibility.  Surgery (rare). Follow these instructions at home: Activity   Rest.  Avoid activities that cause pain.  Return to your normal activities as told by your health care provider. Ask your health care provider what activities are safe for you. Managing pain, stiffness, and swelling   Take over-the-counter and prescription medicines only as told by your health care provider.  If directed, apply heat to the injured area as told by your health care provider.  Place a towel between your skin and the heat source.  Leave the heat on for 20-30 minutes.  Remove the heat if your skin turns bright red. This is especially important if you are unable to feel pain, heat, or cold. You may have a greater risk of getting burned.  If directed, apply ice to the injured area:  Put ice in a plastic bag.  Place a towel between your skin and the bag.  Leave the ice on for 20 minutes, 2-3 times a day. General instructions   If the affected leg is one that you use for driving, ask your health care provider when it is safe to drive.  Use crutches, a   cane, or a walker as told by your health care provider.  If one of your legs is shorter than the other, get fitted for a shoe insert.  Lose weight if you are overweight. How is this prevented?  Wear supportive footwear that is appropriate for your sport.  If you have hip pain, start any new exercise or sport slowly.  Maintain physical fitness, including:  Strength.  Flexibility. Contact a health care provider if:  Your pain does not improve with 2-4 weeks. Get help right away if:  You develop severe  pain.  You have a fever.  You develop increased redness over your hip.  You have a change in your bowel function or bladder function.  You cannot control the muscles in your feet. This information is not intended to replace advice given to you by your health care provider. Make sure you discuss any questions you have with your health care provider. Document Released: 07/07/2004 Document Revised: 02/03/2016 Document Reviewed: 05/15/2015 Elsevier Interactive Patient Education  2017 Elsevier Inc.  

## 2016-10-13 NOTE — Therapy (Signed)
[facility] [address] [place], [place], [address] Phone: [phone]   Fax:  [phone]  Patient Details  Name: Shelly Mcdaniel MRN: [id number] Date of Birth: [birth year] Referring Provider:  No ref. provider found  Encounter Date: 10/13/2016  SPEECH THERAPY DISCHARGE SUMMARY  Visits from Start of Care: 14 (06-01-16 to 08-04-16)  Current functional level related to goals / functional outcomes: Pt's course of ST spanned two months with very little functional gain made during that time due to pt's decr'd sustained attention ability, and selective attention ability (due to internal distraction). Pt and husband decided to "take a break" from [place] on 08-04-16 and possibly return 1 month later, and LTGs were deferred at that time. Being that 2+ months have passed it is unlikely at this time that pt will return during this therapy course.  Pt's goal summary as of her last visit on 08-04-16 was as follows:  SLP Short Term Goals - 08/04/16 1548              SLP SHORT TERM GOAL #1    Title pt will demo sustained/selective attention in NOVEL therapy tasks in a min-mod noisy environment for 10 minutes, with rare min A back to task    Status Not Met         SLP SHORT TERM GOAL #2    Title pt will demo awareness of errors in novel therapy tasks, 60% of the time, over three sessions    Status Not Met         SLP [place] #3    Title pt will demo exercises for incr'd breath support with occasional min A over three sessions    Status Deferred                       SLP Long Term Goals - 08/04/16 [place] #1    Title pt will demo selective attention with simple therapy tasks for 5 minutes with rare min A needed for redirection back to the task over two sessions    Time 2    Period Weeks    Status Deferred         SLP LONG TERM GOAL #2    Title pt will exhibit emergent awareness in simple NOVEL  cognitive linguistic tasks 80% of the time over two sessions    Time 2    Period Weeks    Status Deferred         SLP LONG TERM GOAL #3    Title pt will demonstrate WNL breath support for multiple sentence responses in 5 minute conversation over three sessions     Time 2    Period Weeks    Status Deferred      Remaining deficits: All deficits remain from initial eval on 06-01-16.   Education / Equipment: Engineer, materials for attention and memory. Plan: Patient agrees to discharge.  Patient goals were not met. Patient is being discharged due to the patient's request.  ?????       [facility] ,[place], CCC-SLP  10/13/2016, 2:36 PM  [facility] [address] [place], [place], [address] Phone: [phone]   Fax:  [phone]

## 2016-10-17 ENCOUNTER — Ambulatory Visit: Payer: BC Managed Care – PPO | Attending: Physical Medicine and Rehabilitation | Admitting: Occupational Therapy

## 2016-10-17 DIAGNOSIS — R278 Other lack of coordination: Secondary | ICD-10-CM | POA: Diagnosis present

## 2016-10-17 DIAGNOSIS — R2681 Unsteadiness on feet: Secondary | ICD-10-CM

## 2016-10-17 DIAGNOSIS — R293 Abnormal posture: Secondary | ICD-10-CM | POA: Diagnosis present

## 2016-10-17 DIAGNOSIS — M25512 Pain in left shoulder: Secondary | ICD-10-CM | POA: Insufficient documentation

## 2016-10-17 DIAGNOSIS — I69315 Cognitive social or emotional deficit following cerebral infarction: Secondary | ICD-10-CM | POA: Diagnosis present

## 2016-10-17 DIAGNOSIS — M6281 Muscle weakness (generalized): Secondary | ICD-10-CM

## 2016-10-17 DIAGNOSIS — R2689 Other abnormalities of gait and mobility: Secondary | ICD-10-CM

## 2016-10-17 DIAGNOSIS — G8929 Other chronic pain: Secondary | ICD-10-CM | POA: Diagnosis present

## 2016-10-17 DIAGNOSIS — I69354 Hemiplegia and hemiparesis following cerebral infarction affecting left non-dominant side: Secondary | ICD-10-CM

## 2016-10-17 DIAGNOSIS — R41842 Visuospatial deficit: Secondary | ICD-10-CM

## 2016-10-17 NOTE — Therapy (Signed)
[facility] [address] [place], [place], [address] Phone: [phone]   Fax:  [phone]  Occupational Therapy Evaluation  Patient Details  Name: Shelly Mcdaniel MRN: [id number] Date of Birth: [birth year] Referring Provider: Dr. Wynn Banker  Encounter Date: 10/17/2016      OT End of Session - 10/17/16 1731    Visit Number 1   Number of Visits 11   Date for OT Re-Evaluation 12/15/16   Authorization Type [employer]   Authorization Time Period POC written for 11 visits, however will trial for 8 visits initally, therapy to continue only if pt progressing   OT Start Time 1150   OT Stop Time 1230   OT Time Calculation (min) 40 min   Activity Tolerance Patient tolerated treatment well   Behavior During Therapy [facility] for tasks assessed/performed      Past Medical History:  Diagnosis Date  . Diabetes mellitus without complication (HCC)   . Hyperlipidemia   . Hypertension   . Stroke (HCC)   . UTI (urinary tract infection)     Past Surgical History:  Procedure Laterality Date  . BACK SURGERY    . BREAST SURGERY    . COLONOSCOPY WITH PROPOFOL N/A 12/24/2013   Procedure: COLONOSCOPY WITH PROPOFOL;  Surgeon: Charolett Bumpers, MD;  Location: [facility];  Service: Endoscopy;  Laterality: N/A;    There were no vitals filed for this visit.      Subjective Assessment - 10/17/16 1153    Subjective  Pt and husband report pt has had a decline in function s/p stomach virus, pt previously d/c from therapy in March 2018   Pertinent History  PMH CVA, seizures, adhesive capulitis in shoulder, HTN   Limitations elevated BP and orthostatic hypotension at times, monitor BP-previously  Dr Roda Shutters approved DBP less than 110 and HR 115-120 for therapy paramete   Patient Stated Goals to be more indpendent   Currently in Pain? Yes   Pain Score 2    Pain Location Arm   Pain Orientation Left   Pain Descriptors / Indicators Aching   Pain Type Acute  pain   Pain Onset More than a month ago   Pain Frequency Intermittent   Aggravating Factors  positioning   Pain Relieving Factors support    Multiple Pain Sites Yes   Pain Score 5   Pain Location Hip   Pain Orientation Left   Pain Descriptors / Indicators Aching   Pain Type Acute pain   Pain Onset More than a month ago   Pain Frequency Intermittent   Aggravating Factors  malpositioning   Pain Relieving Factors ice           [facility] OT Assessment - 10/17/16 1205      Assessment   Diagnosis CVA   Referring Provider Dr. Wynn Banker   Onset Date 10/06/16   Assessment Pt / husband report decline in function s/p stomach  virus     Precautions   Precautions Fall   Required Braces or Orthoses Other Brace/Splint   Other Brace/Splint L AFO     Restrictions   Weight Bearing Restrictions No     Balance Screen   Has the patient fallen in the past 6 months Yes   How many times? 3   Has the patient had a decrease in activity level because of a fear of falling?  Yes   Is the patient reluctant to leave their home because of a fear of falling?  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, sport and exercise, more recently stopped working to attend school full time   [employer], play with dogs, cook, cross stich     ADL   Eating/Feeding Needs assist with cutting food   Grooming Set up   Upper Body Bathing Maximal assistance   Lower Body Bathing Maximal assistance   Upper Body Dressing Maximal assistance   Lower Body Dressing Maximal assistance   Toilet Tranfer Maximal assistance  per husband report   Tub/Shower Transfer Maximal assistance  per husband report     Mobility   Mobility Status Needs assist     Vision Assessment   Vision Assessment Vision not tested    Comment hx of left neglect     Cognition   Area of Impairment Attention;Awareness   Current Attention Level Sustained   Memory Decreased short-term memory   Attention Sustained   Sustained Attention Impaired   Awareness Impaired     Sensation   Light Touch Impaired by gross assessment  LUE     Coordination   Gross Motor Movements are Fluid and Coordinated No   Fine Motor Movements are Fluid and Coordinated No     Tone   Assessment Location Left Upper Extremity     AROM   Overall AROM  Deficits   Overall AROM Comments trace active shoulder scapution with mod/ max compensation     PROM   PROM Assessment Site Shoulder;Elbow;Forearm;Wrist;Finger   Left Shoulder Flexion 45 Degrees   Left Elbow Flexion 125   Left Elbow Extension -25   Left Forearm Pronation --  90%   Left Forearm Supination --  75%   Right/Left Wrist Left   Left Wrist Extension --  25%   Left Wrist Flexion --  25%   Right/Left Finger Left   Left Composite Finger Extension --  90%   Left Composite Finger Flexion 75%     LUE Tone   LUE Tone Moderate;Modified Ashworth     LLE Tone   LLE Tone Hypertonic     LLE Tone   Modified Ashworth Scale for Grading Hypertonia LLE More marked increase in muscle tone through most of the ROM, but affected part(s) easily moved                              OT Long Term Goals - 10/17/16 1727      OT LONG TERM GOAL #1   Title Pt/ caregiver will be I with updated HEP- due 12/15/16   Time 8   Period Weeks   Status New     OT LONG TERM GOAL #2   Title Pt will perfom toilet transfers/ shower transfers with mod A    Time 8   Period Weeks   Status New     OT LONG TERM GOAL #3   Title Pt will perform UB bathing with mod A.   Time 8   Period Weeks   Status New     OT LONG TERM GOAL #4   Title Pt will donn shirt with mod A consistently.   Time 8   Period Weeks   Status New     OT LONG TERM GOAL #5  Title Pt / husband will verbalize  understanding of LUE positioning to minimize pain and risk for injury.   Time 8   Period Weeks   Status New               Plan - 10/17/16 1725    Clinical Impression Statement Pt s/p CVA in August 2017 with hx of HTN, HLD, DM was hospitalized 04/05/16 with right pontine and bilateral peduncle infarcts. Pt previously received therapies at [facility] and pt was d/c 08/25/16 from OT. Pt returns to therapy with a decline in functional ADLS and mobility s/p a stomach virus. Pt presents with left hemiplegia, decreased balance, cognitive deficits, and pain which impedes performance of ADLs/IADLS. Pt can benefit from skilled occupational therapy to maximize safety and independence with ADLs/IADLs and to maintain quality of lif   Rehab Potential Fair   OT Frequency --  10 visits plus eval   OT Duration 8 weeks   OT Treatment/Interventions Self-care/ADL training;Moist Heat;DME and/or AE instruction;Splinting;Patient/family education;Therapeutic exercises;Compression bandaging;Therapeutic activities;Neuromuscular education;Functional Mobility Training;Passive range of motion;Cognitive remediation/compensation;Electrical Stimulation;Manual Therapy;Dry needling;Visual/perceptual remediation/compensation   Plan transfers for ADLS/Toileting,, P/ROM HEP for LUE   Consulted and Agree with Plan of Care Patient;Family member/caregiver   Family Member Consulted Husband [name]      Patient will benefit from skilled therapeutic intervention in order to improve the following deficits and impairments:  Decreased activity tolerance, Decreased balance, Decreased cognition, Decreased knowledge of use of DME, Decreased coordination, Decreased endurance, Decreased range of motion, Decreased safety awareness, Decreased skin integrity, Decreased strength, Impaired perceived functional ability, Increased edema, Impaired sensation, Impaired tone, Impaired UE functional use, Impaired vision/preception, Pain, Improper body  mechanics, Decreased mobility, Abnormal gait, Difficulty walking, Decreased knowledge of precautions  Visit Diagnosis: Hemiplegia and hemiparesis following cerebral infarction affecting left non-dominant side (HCC) - Plan: Ot plan of care cert/re-cert  Abnormal posture - Plan: Ot plan of care cert/re-cert  Muscle weakness (generalized) - Plan: Ot plan of care cert/re-cert  Unsteadiness on feet - Plan: Ot plan of care cert/re-cert  Chronic left shoulder pain - Plan: Ot plan of care cert/re-cert  Cognitive social or emotional deficit following cerebral infarction - Plan: Ot plan of care cert/re-cert  Other lack of coordination - Plan: Ot plan of care cert/re-cert  Visuospatial deficit - Plan: Ot plan of care cert/re-cert  Other abnormalities of gait and mobility - Plan: Ot plan of care cert/re-cert    Problem List Patient Active Problem List   Diagnosis Date Noted  . 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 Orthostatic hypotension 04/08/2016  . Muscle cramps 04/08/2016  . 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 Cerebral thrombosis with cerebral infarction 04/06/2016  . Basilar artery stenosis   . Dysarthria 04/05/2016  . History of CVA with residual deficit 04/05/2016  . Sinus tachycardia 04/05/2016  . Acute encephalopathy 04/05/2016  . Chronic pain 04/05/2016  . Other hyperlipidemia 04/05/2016  . Ischemic stroke (HCC)   . 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 TIA (transient ischemic attack) 11/07/2014  . Uncontrolled hypertension 11/07/2014  . Controlled type 2 diabetes mellitus with diabetic nephropathy (HCC) 11/07/2014    RINE,KATHRYN 10/17/2016, 5:53 PM Keene BreathKathryn Rine, OTR/L [phone] Phone: [phone] 5:54 PM 10/17/16 [facility] [address] [place], [place], [address] Phone: [phone]   Fax:   [phone]  Name: Shelly Mcdaniel MRN: [id number] Date of Birth: 02-Jan-1956

## 2016-10-20 ENCOUNTER — Encounter: Payer: Self-pay | Admitting: Rehabilitation

## 2016-10-20 ENCOUNTER — Ambulatory Visit: Payer: BC Managed Care – PPO | Admitting: Rehabilitation

## 2016-10-20 DIAGNOSIS — R293 Abnormal posture: Secondary | ICD-10-CM

## 2016-10-20 DIAGNOSIS — M6281 Muscle weakness (generalized): Secondary | ICD-10-CM

## 2016-10-20 DIAGNOSIS — I69354 Hemiplegia and hemiparesis following cerebral infarction affecting left non-dominant side: Secondary | ICD-10-CM

## 2016-10-20 DIAGNOSIS — R2681 Unsteadiness on feet: Secondary | ICD-10-CM

## 2016-10-20 NOTE — Therapy (Signed)
[facility] [address] [place], [place], [address] Phone: [phone]   Fax:  [phone]  Physical Therapy Evaluation  Patient Details  Name: Shelly Mcdaniel MRN: [id number] Date of Birth: 21-Mar-1956 Referring Provider: Claudette Laws, MD  Encounter Date: 10/20/2016      PT End of Session - 10/20/16 1921    Visit Number 1   Number of Visits 12  will likely only see for 8 visits, pending progress   Date for PT Re-Evaluation 12/19/16   Authorization Type BCBS 0 visit limit, 0 auth   PT Start Time 1402   PT Stop Time 1445   PT Time Calculation (min) 43 min   Activity Tolerance Patient tolerated treatment well   Behavior During Therapy [facility] for tasks assessed/performed      Past Medical History:  Diagnosis Date  . Diabetes mellitus without complication (HCC)   . Hyperlipidemia   . Hypertension   . Stroke (HCC)   . UTI (urinary tract infection)     Past Surgical History:  Procedure Laterality Date  . BACK SURGERY    . BREAST SURGERY    . COLONOSCOPY WITH PROPOFOL N/A 12/24/2013   Procedure: COLONOSCOPY WITH PROPOFOL;  Surgeon: Charolett Bumpers, MD;  Location: [facility];  Service: Endoscopy;  Laterality: N/A;    There were no vitals filed for this visit.       Subjective Assessment - 10/20/16 1409    Subjective Pt familiar to this clinic and presents following ED visit with stomach illness and new onset decline in function.  Note that she also had a fall from recliner last week.    Patient is accompained by: Family member   Limitations House hold activities;Walking   Patient Stated Goals "I want to work on my left extremity."    Currently in Pain? Yes   Pain Score 2    Pain Location Arm   Pain Orientation Left   Pain Descriptors / Indicators Aching   Pain Type Acute pain   Pain Onset More than a month ago   Pain Frequency Intermittent   Aggravating Factors  mal positioning   Pain Relieving  Factors support             [facility] PT Assessment - 10/20/16 0001      Assessment   Medical Diagnosis CVA   Referring Provider Claudette Laws, MD   Onset Date/Surgical Date --  ED visit a couple of weeks ago w/ stomach illness   Prior Therapy previous OP PT     Precautions   Precautions Fall   Required Braces or Orthoses Other Brace/Splint   Other Brace/Splint L AFO     Restrictions   Weight Bearing Restrictions No   Other Position/Activity Restrictions Has splint for LUE at night     Balance Screen   Has the patient fallen in the past 6 months Yes   How many times? 2   Has the patient had a decrease in activity level because of a fear of falling?  Yes   Is the patient reluctant to leave their home because of a fear of falling?  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, sport and exercise, more recently stopped working to attend school full time   Leisure beach, play with dogs, cook, cross stich     Cognition   Overall Cognitive Status Impaired/Different from baseline   Area of Impairment Attention;Awareness   Current Attention Level Sustained   Memory Decreased short-term memory   Attention Sustained   Sustained Attention Impaired   Awareness Impaired     Sensation   Light Touch Impaired by gross assessment   Light Touch Impaired Details Impaired LLE   Proprioception Impaired by gross assessment     Coordination   Gross Motor Movements are  Fluid and Coordinated No   Fine Motor Movements are Fluid and Coordinated No   Coordination and Movement Description decreased due to strength     Tone   Assessment Location Left Lower Extremity     Strength   Overall Strength Deficits   Overall Strength Comments RLE grossly 4/5 overall, L hip flex 3+/5, L knee ext 3+/5, L knee flex 2/5, L ankle DF 0/5, L ankle PF 1/5     Transfers   Transfers Sit to Stand;Stand to Sit;Squat Pivot Transfers;Stand Pivot Transfers   Sit to Stand 3: Mod assist;With upper extremity assist;With armrests   Sit to Stand Details Verbal cues for sequencing;Verbal cues for technique;Manual facilitation for weight shifting;Manual facilitation for weight bearing   Stand to Sit 4: Min assist;With armrests;With upper extremity assist   Stand to Sit Details (indicate cue type and reason) Verbal cues for sequencing;Verbal cues for technique;Manual facilitation for weight shifting;Manual facilitation for weight bearing   Stand Pivot Transfers 4: Min assist;3: Mod assist   Stand Pivot Transfer Details (indicate cue type and reason) Cues for posture and sequecing with min facilitation for weight shift.       Ambulation/Gait   Ambulation/Gait Yes   Ambulation/Gait Assistance 4: Min assist;3: Mod assist   Ambulation/Gait Assistance Details Pt close to baseline ambulation level at previous episode of care.  Assist for weight shift and upright posture with cues for sequencing with [facility]   Ambulation Distance (Feet) 45 Feet   Assistive device Large base quad cane   Gait Pattern Step-through pattern;Decreased stance time - left;Decreased hip/knee flexion - left;Decreased dorsiflexion - left;Lateral trunk lean to right;Decreased arm swing - left;Decreased weight shift to left;Narrow base of support;Left circumduction;Left genu recurvatum;Ataxic   Ambulation Surface Level;Indoor     LLE Tone   LLE Tone Modified Ashworth     LLE Tone   Modified Ashworth Scale for Grading  Hypertonia LLE Slight increase in muscle tone, manifested by a catch and release or by minimal resistance at the end of the range of motion when the affected part(s) is moved in flexion or extension                           PT Education - 10/20/16 1920    Education provided Yes   Education Details education on this POC and emphasis on getting pt to baseline prior to ED admission   Person(s) Educated Patient;Spouse   Methods Explanation   Comprehension Verbalized understanding          PT Short Term Goals -  10/20/16 1934      PT SHORT TERM GOAL #1   Title =LTGs           PT Long Term Goals - 10/20/16 1931      PT LONG TERM GOAL #1   Title Pt will perform bed mobility with use of rail if needed at mod I level in order to indicate increased independence with getting in/out of bed.  (Target Date: 12/19/16)   Status New     PT LONG TERM GOAL #2   Title Pt will perform functional transfers at min A level consistently in order to indicate improved independence at home.    Status New     PT LONG TERM GOAL #3   Title Pt will perform dynamic standing balance with intermittent UE support at S level x 5 mins in order to indicate increased independence with ADLs.    Status New     PT LONG TERM GOAL #4   Title Pt will ambulate x 50' w/ LRAD at min A level consistently in order to indicate increased independence with ADLs at home.    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  Pt with decreased cognition which has greatly impacted progress in the past, decreased balance, generalized weakness and poor endurance.  Pt requires up to mod A for mobility at this time for transfers and [facility].  Pt is of evolving presentation and  moderate complexity from PT POC standpoint.  Pt will benefit from skilled OP neuro PT in order to address deficits.  Pt and husband educated that we would focus on transfers and gait as these are their main goals in getting her back to baseline pre ED visit.     Rehab Potential Fair   Clinical Impairments Affecting Rehab Potential pt motivated but may be limited due to medical complexity and poor cognition   PT Frequency Other (comment)  12 visits (including eval) but will likely only see for 8 visits)   PT Duration 8 weeks   PT Treatment/Interventions ADLs/Self Care Home Management;Canalith Repostioning;Electrical Stimulation;DME Instruction;Stair training;Gait training;Functional mobility training;Therapeutic activities;Therapeutic exercise;Balance training;Neuromuscular re-education;Patient/family education;Orthotic Fit/Training;Passive range of motion;Energy conservation;Taping;Vestibular;Visual/perceptual remediation/compensation   PT Next Visit Plan transfers, bed mobility, and gait (have [name] participate as much as possible for improved carryover)    Consulted and Agree with Plan of Care Patient   Family Member Consulted Husband [name]      Patient will benefit from skilled therapeutic intervention in order to improve the following deficits and impairments:  Abnormal gait, Decreased activity tolerance, Decreased balance, Decreased cognition, Decreased coordination, Decreased endurance, Decreased knowledge of precautions, Decreased knowledge of use of DME, Decreased mobility, Decreased safety awareness, Decreased strength, Impaired perceived functional ability, Impaired flexibility, Impaired sensation, Impaired tone, Impaired UE functional use, Improper body mechanics, Postural dysfunction  Visit Diagnosis: Hemiplegia and hemiparesis following cerebral infarction affecting left non-dominant side (HCC) - Plan: PT plan of care cert/re-cert  Abnormal posture - Plan: PT plan of care  cert/re-cert  Muscle weakness (generalized) - Plan: PT plan of care cert/re-cert  Unsteadiness on feet - Plan: PT plan of care cert/re-cert     Problem List Patient Active Problem List   Diagnosis  Date Noted  . 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 Orthostatic hypotension 04/08/2016  . Muscle cramps 04/08/2016  . 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 Cerebral thrombosis with cerebral infarction 04/06/2016  . Basilar artery stenosis   . Dysarthria 04/05/2016  . History of CVA with residual deficit 04/05/2016  . Sinus tachycardia 04/05/2016  . Acute encephalopathy 04/05/2016  . Chronic pain 04/05/2016  . Other hyperlipidemia 04/05/2016  . Ischemic stroke (HCC)   . 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 TIA (transient ischemic attack) 11/07/2014  . Uncontrolled hypertension 11/07/2014  . Controlled type 2 diabetes mellitus with diabetic nephropathy (HCC) 11/07/2014    Harriet Butte, PT, MPT [facility] [address] [place], [place], [address] Phone: [phone]   Fax:  [phone] 10/20/16, 7:40 PM  Name: JILLIEN YAKEL MRN: [id number] Date of Birth: 1955-12-09

## 2016-10-25 ENCOUNTER — Encounter: Payer: Self-pay | Admitting: Occupational Therapy

## 2016-10-25 ENCOUNTER — Ambulatory Visit: Payer: BC Managed Care – PPO | Admitting: Occupational Therapy

## 2016-10-25 DIAGNOSIS — I69354 Hemiplegia and hemiparesis following cerebral infarction affecting left non-dominant side: Secondary | ICD-10-CM

## 2016-10-25 DIAGNOSIS — M6281 Muscle weakness (generalized): Secondary | ICD-10-CM

## 2016-10-25 DIAGNOSIS — R293 Abnormal posture: Secondary | ICD-10-CM

## 2016-10-25 DIAGNOSIS — I69315 Cognitive social or emotional deficit following cerebral infarction: Secondary | ICD-10-CM

## 2016-10-25 DIAGNOSIS — R2681 Unsteadiness on feet: Secondary | ICD-10-CM

## 2016-10-25 DIAGNOSIS — M25512 Pain in left shoulder: Secondary | ICD-10-CM

## 2016-10-25 DIAGNOSIS — G8929 Other chronic pain: Secondary | ICD-10-CM

## 2016-10-25 NOTE — Therapy (Signed)
[facility] [address] [place], [place], [address] Phone: [phone]   Fax:  [phone]  Occupational Therapy Treatment  Patient Details  Name: Shelly SimmerJody D Mcdaniel MRN: [id number] Date of Birth: 03-25-1956 Referring Provider: Dr. Wynn BankerKirsteins  Encounter Date: 10/25/2016      OT End of Session - 10/25/16 1754    Visit Number 2   Number of Visits 8   Date for OT Re-Evaluation 12/15/16   Authorization Type BCBS   OT Start Time 1104   OT Stop Time 1145   OT Time Calculation (min) 41 min   Activity Tolerance --      Past Medical History:  Diagnosis Date  . Diabetes mellitus without complication (HCC)   . Hyperlipidemia   . Hypertension   . Stroke (HCC)   . UTI (urinary tract infection)     Past Surgical History:  Procedure Laterality Date  . BACK SURGERY    . BREAST SURGERY    . COLONOSCOPY WITH PROPOFOL N/A 12/24/2013   Procedure: COLONOSCOPY WITH PROPOFOL;  Surgeon: Charolett BumpersMartin K Johnson, MD;  Location: [facility];  Service: Endoscopy;  Laterality: N/A;    There were no vitals filed for this visit.      Subjective Assessment - 10/25/16 1108    Subjective  This discussion has been great and I feel good about these goals   Patient is accompained by: Family member  husband [name]   Pertinent History  PMH CVA, seizures, adhesive capulitis in shoulder, HTN   Limitations elevated BP and orthostatic hypotension at times, monitor BP-previously  Dr Roda ShuttersXu approved DBP less than 110 and HR 115-120 for therapy paramete   Patient Stated Goals to be more indpendent   Currently in Pain? No/denies                      OT Treatments/Exercises (OP) - 10/25/16 0001      ADLs   ADL Comments Reviewed goals written from most recent evaluation with pt and husband.  Discussed that I had reviewed that discharge summary from OT in March, the current OT eval, the current PT eval as welll as the physician notes.  In reviewing  goals recently written, pt and husband were able to verbalize that returning the pt back to her baseline from a mobility standpoint prior to recent illness was the priority.  Discussed revision of goals and pt and husband in agreement of new current goals - see goal section for updates.  Husband also stated "I would like to see her get independent with toilet transfers."  Discussed with pt and husband that given pt's memory, attention and problem solving difficulties as well as balance and sensory deficits that this likely would be unsafe goal., especially given that the pt would then need to be independent in all aspects of toileting if the goal was to have pt complete this activity while husband was at work.  Husband and pt both able to verbalize understanding and husband stated "you are right and when you put it in those terms it isn't safe."  All goals revised with pt and husband input. Pt and husband feel they have the tools to work on ADL progression as well as LUE care.                   OT Short Term Goals - 10/17/16 1750      OT SHORT TERM GOAL #1   Title -----------------------------------------------------------------------------------------------------  OT SHORT TERM GOAL #3   Status --           OT Long Term Goals - 10/25/16 1749      OT LONG TERM GOAL #1   Title Pt/ caregiver will be I with updated HEP- due 12/15/16   Time 8   Period Weeks   Status On-going     OT LONG TERM GOAL #2   Title Pt will be min a for toilet transfers   Time 8   Period Weeks   Status Revised     OT LONG TERM GOAL #3   Title Pt will perform UB bathing with mod A.   Time 8   Period Weeks   Status Deferred     OT LONG TERM GOAL #4   Title Pt will donn shirt with mod A consistently.   Time 8   Period Weeks   Status Deferred     OT LONG TERM GOAL #5   Title Pt / husband will verbalize understanding of LUE positioning to minimize pain and risk for injury.   Time 8   Period  Weeks   Status Deferred  husband and pt already have this information and able to verbalize      OT LONG TERM GOAL #6   Title Pt will demonstrate ability to complete sit to stand and stand to sit with min a to aide in functional mobility and reduce caregiver burden during ADL activities.    Status New     OT LONG TERM GOAL #7   Title Pt will tolerate standing activity for 5 minutes with no more than min a in order to allow for functional tasks.   Status New     OT LONG TERM GOAL #8   Title Pt will emonstrate ability for to stand with UE support with supervision during LB ADL's to reduce caregiver burden and maximize independence for pt.    Status New               Plan - 10/25/16 1753    Clinical Impression Statement Revised LTG's based on lengthy discussion with pt and husband. Pt and husband in agreement with goals and current POC   Rehab Potential Fair   OT Frequency 2x / week   OT Duration 4 weeks   OT Treatment/Interventions Self-care/ADL training;Moist Heat;DME and/or AE instruction;Splinting;Patient/family education;Therapeutic exercises;Compression bandaging;Therapeutic activities;Neuromuscular education;Functional Mobility Training;Passive range of motion;Cognitive remediation/compensation;Electrical Stimulation;Manual Therapy;Dry needling;Visual/perceptual remediation/compensation   Plan transfers, sit to stand, stand to sit, static standing balance   Consulted and Agree with Plan of Care Patient;Family member/caregiver   Family Member Consulted Husband [name]      Patient will benefit from skilled therapeutic intervention in order to improve the following deficits and impairments:  Decreased activity tolerance, Decreased balance, Decreased cognition, Decreased knowledge of use of DME, Decreased coordination, Decreased endurance, Decreased range of motion, Decreased safety awareness, Decreased skin integrity, Decreased strength, Impaired perceived functional ability,  Increased edema, Impaired sensation, Impaired tone, Impaired UE functional use, Impaired vision/preception, Pain, Improper body mechanics, Decreased mobility, Abnormal gait, Difficulty walking, Decreased knowledge of precautions  Visit Diagnosis: Hemiplegia and hemiparesis following cerebral infarction affecting left non-dominant side (HCC) - Plan: Ot plan of care cert/re-cert  Abnormal posture - Plan: Ot plan of care cert/re-cert  Muscle weakness (generalized) - Plan: Ot plan of care cert/re-cert  Unsteadiness on feet - Plan: Ot plan of care cert/re-cert  Cognitive social or emotional deficit following cerebral infarction - Plan: Ot plan  of care cert/re-cert  Chronic left shoulder pain - Plan: Ot plan of care cert/re-cert    Problem List Patient Active Problem List   Diagnosis Date Noted  . 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 Orthostatic hypotension 04/08/2016  . Muscle cramps 04/08/2016  . 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 Cerebral thrombosis with cerebral infarction 04/06/2016  . Basilar artery stenosis   . Dysarthria 04/05/2016  . History of CVA with residual deficit 04/05/2016  . Sinus tachycardia 04/05/2016  . Acute encephalopathy 04/05/2016  . Chronic pain 04/05/2016  . Other hyperlipidemia 04/05/2016  . Ischemic stroke (HCC)   . 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 TIA (transient ischemic attack) 11/07/2014  . Uncontrolled hypertension 11/07/2014  . Controlled type 2 diabetes mellitus with diabetic nephropathy (HCC) 11/07/2014    [name], OTR/L 10/25/2016, 5:58 PM  [facility] [address] [place], [place], [address] Phone: [phone]   Fax:  [phone]  Name: TIYANNA LARCOM MRN: [id number] Date of Birth: [birth year]

## 2016-10-27 ENCOUNTER — Encounter: Payer: Self-pay | Admitting: Rehabilitation

## 2016-10-27 ENCOUNTER — Ambulatory Visit: Payer: BC Managed Care – PPO | Admitting: Rehabilitation

## 2016-10-27 DIAGNOSIS — I69354 Hemiplegia and hemiparesis following cerebral infarction affecting left non-dominant side: Secondary | ICD-10-CM | POA: Diagnosis not present

## 2016-10-27 DIAGNOSIS — M6281 Muscle weakness (generalized): Secondary | ICD-10-CM

## 2016-10-27 DIAGNOSIS — R2681 Unsteadiness on feet: Secondary | ICD-10-CM

## 2016-10-27 DIAGNOSIS — R293 Abnormal posture: Secondary | ICD-10-CM

## 2016-10-27 NOTE — Therapy (Signed)
[facility] [address] [place], [place], [address] Phone: [phone]   Fax:  [phone]  Physical Therapy Treatment  Patient Details  Name: Shelly Mcdaniel MRN: [id number] Date of Birth: [birth year] Referring Provider: Claudette Laws, MD  Encounter Date: 10/27/2016      PT End of Session - 10/27/16 1204    Visit Number 2   Number of Visits 12  will likely only see for 8 visits, pending progress   Date for PT Re-Evaluation 12/19/16   Authorization Type BCBS 0 visit limit, 0 auth   PT Start Time 1100   PT Stop Time 1146   PT Time Calculation (min) 46 min   Activity Tolerance Patient tolerated treatment well   Behavior During Therapy [facility] for tasks assessed/performed      Past Medical History:  Diagnosis Date  . Diabetes mellitus without complication (HCC)   . Hyperlipidemia   . Hypertension   . Stroke (HCC)   . UTI (urinary tract infection)     Past Surgical History:  Procedure Laterality Date  . BACK SURGERY    . BREAST SURGERY    . COLONOSCOPY WITH PROPOFOL N/A 12/24/2013   Procedure: COLONOSCOPY WITH PROPOFOL;  Surgeon: Charolett Bumpers, MD;  Location: [facility];  Service: Endoscopy;  Laterality: N/A;    There were no vitals filed for this visit.      Subjective Assessment - 10/27/16 1102    Subjective Pt reports moving arm in a certain way in bed (caught on a pillow) and now has mild pain with certain movements.    Patient is accompained by: Family member   Limitations House hold activities;Walking   Patient Stated Goals "I want to work on my left extremity."    Currently in Pain? Yes   Pain Score 2    Pain Location Arm   Pain Orientation Left   Pain Descriptors / Indicators Aching   Pain Type Acute pain   Pain Onset More than a month ago   Pain Frequency Intermittent   Aggravating Factors  mal positioning   Pain Relieving Factors support                          [facility] Adult PT Treatment/Exercise - 10/27/16 0001      Bed Mobility   Bed Mobility Rolling Right;Right Sidelying to Sit;Sit to Sidelying Right   Rolling Right 4: Min guard   Rolling Right Details (indicate cue type and reason) Min tactile cues for ensuring that L shoulder also moved forward with body to allow improved ease of rolling.  Performed x 3 reps.   Right Sidelying to Sit 4: Min guard   Right Sidelying to Sit Details (indicate cue type and reason) Tactile cues for initiating movement, however pt did very well getting onto R elbow to elevate into sitting.  Note that during second rep, pt with some motor planning issues, however again with light tactile cues she was able to pull elbow under body and elevate to sitting.  cues to maintain forward weight shift.      Transfers   Squat Pivot Transfers 4: Min guard;4: Min Development worker, community Details (indicate cue type and reason) Session focused on functional squat pivot transfers w/c<>bed, bed<>commode chair, and commode chair<>w/c.  Elevated mat to 27" to simulate bed height at home.  Transferred x approx 8-10 reps during session with cues to pt as well as husband on  keeping feet on floor, use of non skid socks if she doesn't have shoes donned, removal of lower bed rail when not sleeping in order to allow for more independent bed mobility.  Also provided cues for hand placement and cuing pt to remain forward even if on EOB to prevent forward translation to floor.  Both verbalized understanding.       Self-Care   Self-Care Other Self-Care Comments   Other Self-Care Comments  Discussed ways of improving home set up in order to allow pt to set up w/c for w/c>bed transfer as well as w/c>commode.  Also discussed removal of lower bed rail to alllow increased independence with bed mobility and transfers.                 PT Education - 10/27/16 1204    Education provided Yes   Education Details see self care and mobility sections    Person(s) Educated Patient;Spouse   Methods Explanation;Demonstration   Comprehension Returned demonstration;Verbalized understanding;Need further instruction          PT Short Term Goals - 10/20/16 1934      PT SHORT TERM GOAL #1   Title =LTGs           PT Long Term Goals - 10/20/16 1931      PT LONG TERM GOAL #1   Title Pt will perform bed mobility with use of rail if needed at mod I level in order to indicate increased independence with getting in/out of bed.  (Target Date: 12/19/16)   Status New     PT LONG TERM GOAL #2   Title Pt will perform functional transfers at min A level consistently in order to indicate improved independence at home.    Status New     PT LONG TERM GOAL #3   Title Pt will perform dynamic standing balance with intermittent UE support at S level x 5 mins in order to indicate increased independence with ADLs.    Status New     PT LONG TERM GOAL #4   Title Pt will ambulate x 50' w/ LRAD at min A level consistently in order to indicate increased independence with ADLs at home.    Status New               Plan - 10/27/16 1205    Clinical Impression Statement Skilled session focused on block practice of functional transfers to simulate home set up and bed mobility for improved independence and decreased reliance of bed rails.  Pt did very well but will need carryover from husband and practice to see improvements.    Rehab Potential Fair   Clinical Impairments Affecting Rehab Potential pt motivated but may be limited due to medical complexity and poor cognition   PT Frequency Other (comment)  12 visits (including eval) but will likely only see for 8 visits)   PT Duration 8 weeks   PT Treatment/Interventions ADLs/Self Care Home Management;Canalith Repostioning;Electrical Stimulation;DME Instruction;Stair training;Gait training;Functional mobility training;Therapeutic activities;Therapeutic exercise;Balance training;Neuromuscular  re-education;Patient/family education;Orthotic Fit/Training;Passive range of motion;Energy conservation;Taping;Vestibular;Visual/perceptual remediation/compensation   PT Next Visit Plan At beginning of session, have Jesusita OkaDan do a transfer to 27" height mat (squat pivot) and from bed to standard arm chair, move on to sit<>stand and standing balance with ADLs at home, gait (have Jesusita Okaan participate as much as possible for improved carryover)    Consulted and Agree with Plan of Care Patient   Family Member Consulted Husband [name]      Patient  will benefit from skilled therapeutic intervention in order to improve the following deficits and impairments:  Abnormal gait, Decreased activity tolerance, Decreased balance, Decreased cognition, Decreased coordination, Decreased endurance, Decreased knowledge of precautions, Decreased knowledge of use of DME, Decreased mobility, Decreased safety awareness, Decreased strength, Impaired perceived functional ability, Impaired flexibility, Impaired sensation, Impaired tone, Impaired UE functional use, Improper body mechanics, Postural dysfunction  Visit Diagnosis: Hemiplegia and hemiparesis following cerebral infarction affecting left non-dominant side (HCC)  Abnormal posture  Muscle weakness (generalized)  Unsteadiness on feet     Problem List Patient Active Problem List   Diagnosis Date Noted  . 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 Orthostatic hypotension 04/08/2016  . Muscle cramps 04/08/2016  . 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 Cerebral thrombosis with cerebral infarction 04/06/2016  . Basilar artery stenosis   . Dysarthria 04/05/2016  . History of CVA with residual deficit 04/05/2016  . Sinus tachycardia 04/05/2016  . Acute encephalopathy 04/05/2016  . Chronic pain 04/05/2016  . Other hyperlipidemia 04/05/2016  . Ischemic stroke (HCC)   . Ataxia  11/07/2014  . TIA (transient ischemic attack) 11/07/2014  . Uncontrolled hypertension 11/07/2014  . Controlled type 2 diabetes mellitus with diabetic nephropathy (HCC) 11/07/2014    Harriet Butte, PT, MPT [facility] [address] [place], [place], [address] Phone: [phone]   Fax:  [phone] 10/27/16, 12:10 PM  Name: Shelly Mcdaniel MRN: [id number] Date of Birth: Apr 13, 1956

## 2016-11-01 ENCOUNTER — Ambulatory Visit: Payer: BC Managed Care – PPO | Admitting: Occupational Therapy

## 2016-11-01 ENCOUNTER — Encounter: Payer: Self-pay | Admitting: Occupational Therapy

## 2016-11-01 DIAGNOSIS — M6281 Muscle weakness (generalized): Secondary | ICD-10-CM

## 2016-11-01 DIAGNOSIS — M25512 Pain in left shoulder: Secondary | ICD-10-CM

## 2016-11-01 DIAGNOSIS — R293 Abnormal posture: Secondary | ICD-10-CM

## 2016-11-01 DIAGNOSIS — I69354 Hemiplegia and hemiparesis following cerebral infarction affecting left non-dominant side: Secondary | ICD-10-CM

## 2016-11-01 DIAGNOSIS — R2681 Unsteadiness on feet: Secondary | ICD-10-CM

## 2016-11-01 DIAGNOSIS — G8929 Other chronic pain: Secondary | ICD-10-CM

## 2016-11-01 NOTE — Therapy (Signed)
[facility] [address] [place], [place], [address] Phone: [phone]   Fax:  [phone]  Occupational Therapy Treatment  Patient Details  Name: Shelly Mcdaniel MRN: [id number] Date of Birth: 07/19/1955 Referring Provider: Dr. Wynn Mcdaniel  Encounter Date: 11/01/2016      OT End of Session - 11/01/16 1604    Visit Number 3   Number of Visits 8   Date for OT Re-Evaluation 12/15/16   Authorization Type BCBS   Authorization Time Period POC written for 11 visits, however will trial for 8 visits initally, therapy to continue only if pt progressing   OT Start Time 1319  pt arrived late   OT Stop Time 1400   OT Time Calculation (min) 41 min   Activity Tolerance --      Past Medical History:  Diagnosis Date  . Diabetes mellitus without complication (HCC)   . Hyperlipidemia   . Hypertension   . Stroke (HCC)   . UTI (urinary tract infection)     Past Surgical History:  Procedure Laterality Date  . BACK SURGERY    . BREAST SURGERY    . COLONOSCOPY WITH PROPOFOL N/A 12/24/2013   Procedure: COLONOSCOPY WITH PROPOFOL;  Surgeon: Shelly Bumpers, MD;  Location: [facility];  Service: Endoscopy;  Laterality: N/A;    There were no vitals filed for this visit.      Subjective Assessment - 11/01/16 1321    Subjective  I had some pain when I woke up but not anymore   Patient is accompained by: Family member  Shelly   Pertinent History  PMH CVA, seizures, adhesive capulitis in shoulder, HTN   Limitations elevated BP and orthostatic hypotension at times, monitor BP-previously  Shelly Mcdaniel approved DBP less than 110 and HR 115-120 for therapy paramete   Patient Stated Goals to be more independent   Currently in Pain? No/denies                      OT Treatments/Exercises (OP) - 11/01/16 0001      ADLs   Functional Mobility Addressed squat pivot transfers with mat at 27 inches high to simulate chair to bed  transfers at home.  Pt needed min vc's for feet placement however was then light min a for transfer.  Pt able to transition from sitting EOM to supine with close supervision only.  Min cues to attend to LUE when transitioning from supine to sitting and supervision. Also addressed sit to stand, static standing balance with 1 UE support and standing tolerence. Pt able to move from sit to stand with min a, and then able to maintain static standing with 1 UE support with close supervision to occassional contact guard.  Pt with three trials and tolerated standing 1  minute, 1.5  minutes and 2 minutes with fatigue rating of 3/10.  Pt tolerates standing longer if distracted from activity.                   OT Short Term Goals - 11/01/16 1603      OT SHORT TERM GOAL #1   Title -----------------------------------------------------------------------------------------------------           OT Long Term Goals - 11/01/16 1603      OT LONG TERM GOAL #1   Title Pt/ caregiver will be I with updated HEP- due 12/15/16   Time 8   Period Weeks   Status On-going     OT LONG  TERM GOAL #2   Title Pt will be min a for toilet transfers   Time 8   Period Weeks   Status On-going     OT LONG TERM GOAL #3   Title Pt will perform UB bathing with mod A.   Time 8   Period Weeks   Status Deferred     OT LONG TERM GOAL #4   Title Pt will donn shirt with mod A consistently.   Time 8   Period Weeks   Status Deferred     OT LONG TERM GOAL #5   Title Pt / Shelly will verbalize understanding of LUE positioning to minimize pain and risk for injury.   Time 8   Period Weeks   Status Deferred  Shelly and pt already have this information and able to verbalize      OT LONG TERM GOAL #6   Title Pt will demonstrate ability to complete sit to stand and stand to sit with min a to aide in functional mobility and reduce caregiver burden during ADL activities.    Status On-going     OT LONG TERM GOAL #7    Title Pt will tolerate standing activity for 5 minutes with no more than min a in order to allow for functional tasks.   Status On-going     OT LONG TERM GOAL #8   Title Pt will emonstrate ability for to stand with UE support with supervision during LB ADL's to reduce caregiver burden and maximize independence for pt.    Status On-going               Plan - 11/01/16 1603    Clinical Impression Statement Pt progressng toward goals. Pt reports she feels stronger this week and is able to move more this week. Shelly confirms   Rehab Potential Fair   OT Frequency 2x / week   OT Duration 4 weeks   OT Treatment/Interventions Self-care/ADL training;Moist Heat;DME and/or AE instruction;Splinting;Patient/family education;Therapeutic exercises;Compression bandaging;Therapeutic activities;Neuromuscular education;Functional Mobility Training;Passive range of motion;Cognitive remediation/compensation;Electrical Stimulation;Manual Therapy;Dry needling;Visual/perceptual remediation/compensation   Plan transfers, sit to stand, stand to sit, static standing balance with 1 UE support   Consulted and Agree with Plan of Care Patient;Family member/caregiver   Family Member Consulted Shelly Mcdaniel      Patient will benefit from skilled therapeutic intervention in order to improve the following deficits and impairments:  Decreased activity tolerance, Decreased balance, Decreased cognition, Decreased knowledge of use of DME, Decreased coordination, Decreased endurance, Decreased range of motion, Decreased safety awareness, Decreased skin integrity, Decreased strength, Impaired perceived functional ability, Increased edema, Impaired sensation, Impaired tone, Impaired UE functional use, Impaired vision/preception, Pain, Improper body mechanics, Decreased mobility, Abnormal gait, Difficulty walking, Decreased knowledge of precautions  Visit Diagnosis: Hemiplegia and hemiparesis following cerebral infarction  affecting left non-dominant side (HCC)  Abnormal posture  Muscle weakness (generalized)  Unsteadiness on feet  Chronic left shoulder pain    Problem List Patient Active Problem List   Diagnosis Date Noted  . 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 Orthostatic hypotension 04/08/2016  . Muscle cramps 04/08/2016  . 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 Cerebral thrombosis with cerebral infarction 04/06/2016  . Basilar artery stenosis   . Dysarthria 04/05/2016  . History of CVA with residual deficit 04/05/2016  . Sinus tachycardia 04/05/2016  . Acute encephalopathy 04/05/2016  . Chronic pain 04/05/2016  . Other hyperlipidemia 04/05/2016  .  Ischemic stroke (HCC)   . 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 TIA (transient ischemic attack) 11/07/2014  . Uncontrolled hypertension 11/07/2014  . Controlled type 2 diabetes mellitus with diabetic nephropathy (HCC) 11/07/2014    Norton PastelPulaski, Karen Halliday, OTR/L 11/01/2016, 4:07 PM  [facility] [address] [place], [place], [address] Phone: [phone]   Fax:  [phone]  Name: Wendie SimmerJody D Donnellan MRN: [id number] Date of Birth: Jan 28, 1956

## 2016-11-04 ENCOUNTER — Ambulatory Visit: Payer: BC Managed Care – PPO | Admitting: Rehabilitation

## 2016-11-04 ENCOUNTER — Encounter: Payer: Self-pay | Admitting: Rehabilitation

## 2016-11-04 DIAGNOSIS — R293 Abnormal posture: Secondary | ICD-10-CM

## 2016-11-04 DIAGNOSIS — I69354 Hemiplegia and hemiparesis following cerebral infarction affecting left non-dominant side: Secondary | ICD-10-CM | POA: Diagnosis not present

## 2016-11-04 DIAGNOSIS — R2681 Unsteadiness on feet: Secondary | ICD-10-CM

## 2016-11-04 DIAGNOSIS — M6281 Muscle weakness (generalized): Secondary | ICD-10-CM

## 2016-11-04 NOTE — Therapy (Signed)
[facility] [address] [place], [place], [address] Phone: [phone]   Fax:  [phone]  Physical Therapy Treatment  Patient Details  Name: Shelly Mcdaniel MRN: [id number] Date of Birth: 07-29-1955 Referring Provider: Claudette LawsAndrew Kirsteins, MD  Encounter Date: 11/04/2016      PT End of Session - 11/04/16 1308    Visit Number 3   Number of Visits 12  will likely only see for 8 visits, pending progress   Date for PT Re-Evaluation 12/19/16   Authorization Type BCBS 0 visit limit, 0 auth   PT Start Time 1146   PT Stop Time 1233   PT Time Calculation (min) 47 min   Activity Tolerance Patient tolerated treatment well   Behavior During Therapy [facility] for tasks assessed/performed      Past Medical History:  Diagnosis Date  . Diabetes mellitus without complication (HCC)   . Hyperlipidemia   . Hypertension   . Stroke (HCC)   . UTI (urinary tract infection)     Past Surgical History:  Procedure Laterality Date  . BACK SURGERY    . BREAST SURGERY    . COLONOSCOPY WITH PROPOFOL N/A 12/24/2013   Procedure: COLONOSCOPY WITH PROPOFOL;  Surgeon: Charolett BumpersMartin K Johnson, MD;  Location: [facility];  Service: Endoscopy;  Laterality: N/A;    There were no vitals filed for this visit.      Subjective Assessment - 11/04/16 1259    Subjective Reports having removed lower bed rail at home to improve independence with bed mobility.    Patient is accompained by: Family member   Limitations House hold activities;Walking   Patient Stated Goals "I want to work on my left extremity."    Currently in Pain? No/denies                         [facility] Adult PT Treatment/Exercise - 11/04/16 0001      Bed Mobility   Bed Mobility Rolling Right;Right Sidelying to Sit;Sit to Sidelying Right   Rolling Right 5: Supervision   Rolling Right Details (indicate cue type and reason) Min cues to [facility] on having pt bring L shoulder more  forward when rolling   Right Sidelying to Sit 5: Supervision   Sit to Sidelying Right 5: Supervision   Sit to Sidelying Right Details (indicate cue type and reason) Went over all aspects of bed mobility with husband leading providing cues as needed.  She still requires cues for adequate rolling, but overall is doing much better.      Transfers   Transfers Sit to Stand;Stand to Sit;Squat Pivot Transfers   Sit to Stand 4: Min assist;4: Min guard   Sit to Stand Details Verbal cues for sequencing;Verbal cues for technique;Manual facilitation for weight shifting;Manual facilitation for weight bearing   Sit to Stand Details (indicate cue type and reason) Blocked practice of sit<>stand for improved technique, equal WB and improved awareness of L LE activation.  Performed x 10 reps with R LE slightly infront of L to increase amount of LLE activation.  Pt requires min A fading to min/guard with use of mirror for improved midline posture.  Tolerated well.    Stand to Sit 5: Supervision;4: Min guard   Stand to Sit Details (indicate cue type and reason) Verbal cues for sequencing;Verbal cues for technique;Manual facilitation for weight shifting;Manual facilitation for weight bearing   Squat Pivot Transfers 4: Min guard;4: Primary school teacherMin assist   Squat Pivot Transfer Details (  indicate cue type and reason) Went over transfers at beginning of session and had [name] simulate going from w/c to "bed" and bed to "bedside commode" and back to w/c, all going to the R.  Pt able to complete at min/guard level except for last transfer in which she needed min A to prevent posterior LOB.  Cues for forward weight shift and trunk lean and note improvement with next tranfser.  Performed x 4 reps.  [name] assisted with 3 with min cues for hand placement as needed.      Ambulation/Gait   Ambulation/Gait Yes   Ambulation/Gait Assistance 4: Min assist;3: Mod assist   Ambulation/Gait Assistance Details Pt ambulated with [facility] during session with  min/mod A. Note marked difficulty maintaining LLE knee extension and hip protraction and continued upright posture.  Facilitation at ribs and pelvis to assist as needed.    Ambulation Distance (Feet) 50 Feet   Assistive device Large base quad cane   Gait Pattern Step-through pattern;Decreased stance time - left;Decreased hip/knee flexion - left;Decreased dorsiflexion - left;Lateral trunk lean to right;Decreased arm swing - left;Decreased weight shift to left;Narrow base of support;Left circumduction;Left genu recurvatum;Ataxic   Ambulation Surface Level;Indoor                PT Education - 11/04/16 1307    Education provided Yes   Education Details Provided brief education to [place] on how to manuever stairs with pt.  Had husband pretend to be patient while therapist assisted.  Husband returned demo on PT    Person(s) Educated Patient;Spouse   Methods Explanation;Demonstration   Comprehension Verbalized understanding;Returned demonstration;Need further instruction          PT Short Term Goals - 10/20/16 1934      PT SHORT TERM GOAL #1   Title =LTGs           PT Long Term Goals - 10/20/16 1931      PT LONG TERM GOAL #1   Title Pt will perform bed mobility with use of rail if needed at mod I level in order to indicate increased independence with getting in/out of bed.  (Target Date: 12/19/16)   Status New     PT LONG TERM GOAL #2   Title Pt will perform functional transfers at min A level consistently in order to indicate improved independence at home.    Status New     PT LONG TERM GOAL #3   Title Pt will perform dynamic standing balance with intermittent UE support at S level x 5 mins in order to indicate increased independence with ADLs.    Status New     PT LONG TERM GOAL #4   Title Pt will ambulate x 50' w/ LRAD at min A level consistently in order to indicate increased independence with ADLs at home.    Status New               Plan - 11/04/16 1308     Clinical Impression Statement Session focused on brief review of transfers and bed mobility from last session with good carryover.  Also work on blocked practice of sit<>stand for improved technique and gait with LBQC.  Pt requires up to mod A for gait.    Rehab Potential Fair   Clinical Impairments Affecting Rehab Potential pt motivated but may be limited due to medical complexity and poor cognition   PT Frequency Other (comment)  12 visits (including eval) but will likely only see for 8 visits)  PT Duration 8 weeks   PT Treatment/Interventions ADLs/Self Care Home Management;Canalith Repostioning;Electrical Stimulation;DME Instruction;Stair training;Gait training;Functional mobility training;Therapeutic activities;Therapeutic exercise;Balance training;Neuromuscular re-education;Patient/family education;Orthotic Fit/Training;Passive range of motion;Energy conservation;Taping;Vestibular;Visual/perceptual remediation/compensation   PT Next Visit Plan , move on to sit<>stand and standing balance with ADLs at home, gait (have [name] participate as much as possible for improved carryover)    Consulted and Agree with Plan of Care Patient   Family Member Consulted Husband [name]      Patient will benefit from skilled therapeutic intervention in order to improve the following deficits and impairments:  Abnormal gait, Decreased activity tolerance, Decreased balance, Decreased cognition, Decreased coordination, Decreased endurance, Decreased knowledge of precautions, Decreased knowledge of use of DME, Decreased mobility, Decreased safety awareness, Decreased strength, Impaired perceived functional ability, Impaired flexibility, Impaired sensation, Impaired tone, Impaired UE functional use, Improper body mechanics, Postural dysfunction  Visit Diagnosis: Hemiplegia and hemiparesis following cerebral infarction affecting left non-dominant side (HCC)  Abnormal posture  Muscle weakness (generalized)  Unsteadiness  on feet     Problem List Patient Active Problem List   Diagnosis Date Noted  . 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 Orthostatic hypotension 04/08/2016  . Muscle cramps 04/08/2016  . 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 Cerebral thrombosis with cerebral infarction 04/06/2016  . Basilar artery stenosis   . Dysarthria 04/05/2016  . History of CVA with residual deficit 04/05/2016  . Sinus tachycardia 04/05/2016  . Acute encephalopathy 04/05/2016  . Chronic pain 04/05/2016  . Other hyperlipidemia 04/05/2016  . Ischemic stroke (HCC)   . 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 TIA (transient ischemic attack) 11/07/2014  . Uncontrolled hypertension 11/07/2014  . Controlled type 2 diabetes mellitus with diabetic nephropathy (HCC) 11/07/2014    Harriet Butte, PT, MPT [facility] [address] [place], [place], [address] Phone: [phone]   Fax:  [phone] 11/04/16, 1:14 PM  Name: Shelly Mcdaniel MRN: [id number] Date of Birth: 05-26-1956

## 2016-11-08 ENCOUNTER — Encounter: Payer: Self-pay | Admitting: Occupational Therapy

## 2016-11-08 ENCOUNTER — Ambulatory Visit: Payer: BC Managed Care – PPO | Admitting: Occupational Therapy

## 2016-11-08 DIAGNOSIS — R2681 Unsteadiness on feet: Secondary | ICD-10-CM

## 2016-11-08 DIAGNOSIS — R278 Other lack of coordination: Secondary | ICD-10-CM

## 2016-11-08 DIAGNOSIS — I69354 Hemiplegia and hemiparesis following cerebral infarction affecting left non-dominant side: Secondary | ICD-10-CM | POA: Diagnosis not present

## 2016-11-08 DIAGNOSIS — R41842 Visuospatial deficit: Secondary | ICD-10-CM

## 2016-11-08 DIAGNOSIS — I69315 Cognitive social or emotional deficit following cerebral infarction: Secondary | ICD-10-CM

## 2016-11-08 DIAGNOSIS — R293 Abnormal posture: Secondary | ICD-10-CM

## 2016-11-08 DIAGNOSIS — G8929 Other chronic pain: Secondary | ICD-10-CM

## 2016-11-08 DIAGNOSIS — M25512 Pain in left shoulder: Secondary | ICD-10-CM

## 2016-11-08 DIAGNOSIS — M6281 Muscle weakness (generalized): Secondary | ICD-10-CM

## 2016-11-08 NOTE — Therapy (Signed)
[facility] [address] [place], [place], [address] Phone: [phone]   Fax:  [phone]  Occupational Therapy Treatment  Patient Details  Name: Shelly SimmerJody D Lewellyn MRN: [id number] Date of Birth: 04/27/1956 Referring Provider: Dr. Wynn BankerKirsteins  Encounter Date: 11/08/2016      OT End of Session - 11/08/16 1654    Visit Number 4   Number of Visits 8   Date for OT Re-Evaluation 12/15/16   Authorization Type BCBS   Authorization Time Period POC written for 11 visits, however will trial for 8 visits initally, therapy to continue only if pt progressing   OT Start Time 1535   OT Stop Time 1616   OT Time Calculation (min) 41 min   Activity Tolerance Patient tolerated treatment well   Behavior During Therapy [facility] for tasks assessed/performed      Past Medical History:  Diagnosis Date  . Diabetes mellitus without complication (HCC)   . Hyperlipidemia   . Hypertension   . Stroke (HCC)   . UTI (urinary tract infection)     Past Surgical History:  Procedure Laterality Date  . BACK SURGERY    . BREAST SURGERY    . COLONOSCOPY WITH PROPOFOL N/A 12/24/2013   Procedure: COLONOSCOPY WITH PROPOFOL;  Surgeon: Charolett BumpersMartin K Johnson, MD;  Location: [facility];  Service: Endoscopy;  Laterality: N/A;    There were no vitals filed for this visit.      Subjective Assessment - 11/08/16 1644    Subjective  I did have a fall getting from the potty to the bed   Pertinent History  PMH CVA, seizures, adhesive capulitis in shoulder, HTN   Limitations elevated BP and orthostatic hypotension at times, monitor BP-previously  Dr Roda ShuttersXu approved DBP less than 110 and HR 115-120 for therapy paramete   Patient Stated Goals to be more independent   Currently in Pain? No/denies   Pain Score 0-No pain                      OT Treatments/Exercises (OP) - 11/08/16 0001      ADLs   Toileting Simulated bedroom set up of bed and bedside  commode.  Patient able to transfer toward weaker side to [facility] with min assist, and toward stronger side with close supervision, and cueing for set up.     Functional Mobility Worked on improving balance and safety with sit to stand transitions at simulated bed height.  Patient needed cueing for set up of feet on floor and for alignment for movement.  Patient with tendency to flex at upper trunk instead of hips with forward weight shift, which actually shifts her backward.  Patient with poor ability to weight shift forward over feet - did well with overt cueing and somatosensory and visual feedback.  Patient with faulty balance reactions (preamture) with forward weight shift, but with repetition and feedback - this improved during the session..  Will need further reinforcement for lasting change.  Worked on very slow very small weight shifts in standing - patient needed frequent cueing to maintain activity in left leg to assist with standing and balance.                  OT Education - 11/08/16 1653    Education provided Yes   Education Details Set up for squat pivot transfers   Person(s) Educated Patient   Methods Explanation;Demonstration   Comprehension Verbal cues required;Need further instruction  OT Short Term Goals - 11/01/16 1603      OT SHORT TERM GOAL #1   Title -----------------------------------------------------------------------------------------------------           OT Long Term Goals - 11/08/16 1656      OT LONG TERM GOAL #1   Title Pt/ caregiver will be I with updated HEP- due 12/15/16   Status On-going     OT LONG TERM GOAL #2   Title Pt will be min a for toilet transfers   Status Achieved     OT LONG TERM GOAL #3   Title Pt will perform UB bathing with mod A.   Status On-going     OT LONG TERM GOAL #4   Title Pt will donn shirt with mod A consistently.   Status Deferred     OT LONG TERM GOAL #5   Title Pt / husband will verbalize  understanding of LUE positioning to minimize pain and risk for injury.   Status Deferred     OT LONG TERM GOAL #6   Title Pt will demonstrate ability to complete sit to stand and stand to sit with min a to aide in functional mobility and reduce caregiver burden during ADL activities.    Status On-going     OT LONG TERM GOAL #7   Title Pt will tolerate standing activity for 5 minutes with no more than min a in order to allow for functional tasks.   Status On-going     OT LONG TERM GOAL #8   Title Pt will emonstrate ability for to stand with UE support with supervision during LB ADL's to reduce caregiver burden and maximize independence for pt.    Status On-going               Plan - 11/08/16 1654    Clinical Impression Statement Patient progressing in therapy sessions, yet still having occasional falls at home.  Patient now has a nurse coming three days / week when husband is away.    Occupational Profile and client history currently impacting functional performance see eval   Occupational performance deficits (Please refer to evaluation for details): ADL's;Rest and Sleep;Social Participation   Rehab Potential Fair   OT Frequency 2x / week   OT Duration 4 weeks   OT Treatment/Interventions Self-care/ADL training;Moist Heat;DME and/or AE instruction;Splinting;Patient/family education;Therapeutic exercises;Compression bandaging;Therapeutic activities;Neuromuscular education;Functional Mobility Training;Passive range of motion;Cognitive remediation/compensation;Electrical Stimulation;Manual Therapy;Dry needling;Visual/perceptual remediation/compensation   Clinical Decision Making Several treatment options, min-mod task modification necessary   Consulted and Agree with Plan of Care Patient   Plan squat pivot transfers, sit to stand, stand balance with 1 UE support      Patient will benefit from skilled therapeutic intervention in order to improve the following deficits and  impairments:  Decreased activity tolerance, Decreased balance, Decreased cognition, Decreased knowledge of use of DME, Decreased coordination, Decreased endurance, Decreased range of motion, Decreased safety awareness, Decreased skin integrity, Decreased strength, Impaired perceived functional ability, Increased edema, Impaired sensation, Impaired tone, Impaired UE functional use, Impaired vision/preception, Pain, Improper body mechanics, Decreased mobility, Abnormal gait, Difficulty walking, Decreased knowledge of precautions  Visit Diagnosis: Hemiplegia and hemiparesis following cerebral infarction affecting left non-dominant side (HCC)  Abnormal posture  Muscle weakness (generalized)  Unsteadiness on feet  Chronic left shoulder pain  Cognitive social or emotional deficit following cerebral infarction  Other lack of coordination  Visuospatial deficit    Problem List Patient Active Problem List   Diagnosis Date Noted  . Adhesive capsulitis  of left shoulder 06/16/2016  . Orthostatic hypotension 04/08/2016  . Muscle cramps 04/08/2016  . 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 Cerebral thrombosis with cerebral infarction 04/06/2016  . Basilar artery stenosis   . Dysarthria 04/05/2016  . History of CVA with residual deficit 04/05/2016  . Sinus tachycardia 04/05/2016  . Acute encephalopathy 04/05/2016  . Chronic pain 04/05/2016  . Other hyperlipidemia 04/05/2016  . Ischemic stroke (HCC)   . 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 TIA (transient ischemic attack) 11/07/2014  . Uncontrolled hypertension 11/07/2014  . Controlled type 2 diabetes mellitus with diabetic nephropathy (HCC) 11/07/2014    Collier Salina, OTR/L 11/08/2016, 4:57 PM  [facility] [address] [place], [place], [address] Phone: [phone]   Fax:  [phone]  Name: DASHAWN GOLDA MRN: [id number] Date of Birth: [birth year]

## 2016-11-11 ENCOUNTER — Ambulatory Visit: Payer: BC Managed Care – PPO | Admitting: Physical Therapy

## 2016-11-17 ENCOUNTER — Ambulatory Visit: Payer: BC Managed Care – PPO | Admitting: Rehabilitation

## 2016-11-17 ENCOUNTER — Encounter: Payer: Self-pay | Admitting: Rehabilitation

## 2016-11-17 ENCOUNTER — Telehealth: Payer: Self-pay | Admitting: Rehabilitation

## 2016-11-17 ENCOUNTER — Ambulatory Visit: Payer: BC Managed Care – PPO | Attending: Physical Medicine and Rehabilitation | Admitting: Occupational Therapy

## 2016-11-17 ENCOUNTER — Ambulatory Visit: Payer: BC Managed Care – PPO | Admitting: Physical Medicine & Rehabilitation

## 2016-11-17 DIAGNOSIS — R2681 Unsteadiness on feet: Secondary | ICD-10-CM

## 2016-11-17 DIAGNOSIS — I69315 Cognitive social or emotional deficit following cerebral infarction: Secondary | ICD-10-CM | POA: Insufficient documentation

## 2016-11-17 DIAGNOSIS — I69354 Hemiplegia and hemiparesis following cerebral infarction affecting left non-dominant side: Secondary | ICD-10-CM

## 2016-11-17 DIAGNOSIS — R293 Abnormal posture: Secondary | ICD-10-CM | POA: Diagnosis present

## 2016-11-17 DIAGNOSIS — F3289 Other specified depressive episodes: Secondary | ICD-10-CM

## 2016-11-17 DIAGNOSIS — M6281 Muscle weakness (generalized): Secondary | ICD-10-CM | POA: Diagnosis present

## 2016-11-17 DIAGNOSIS — G8929 Other chronic pain: Secondary | ICD-10-CM | POA: Diagnosis present

## 2016-11-17 DIAGNOSIS — R278 Other lack of coordination: Secondary | ICD-10-CM | POA: Diagnosis present

## 2016-11-17 DIAGNOSIS — R41842 Visuospatial deficit: Secondary | ICD-10-CM | POA: Insufficient documentation

## 2016-11-17 DIAGNOSIS — M25512 Pain in left shoulder: Secondary | ICD-10-CM | POA: Diagnosis present

## 2016-11-17 NOTE — Therapy (Signed)
[facility] [address] [place], [place], [address] Phone: [phone]   Fax:  [phone]  Occupational Therapy Treatment  Patient Details  Name: Shelly Mcdaniel MRN: [id number] Date of Birth: February 09, 1956 Referring Provider: Dr. Letta Pate  Encounter Date: 11/17/2016      OT End of Session - 11/17/16 1654    Visit Number 5   Number of Visits 8   Date for OT Re-Evaluation 12/15/16   Authorization Type BCBS   Authorization Time Period POC written for 11 visits, however will trial for 8 visits initally, therapy to continue only if pt progressing   OT Start Time 1449   OT Stop Time 1530   OT Time Calculation (min) 41 min   Activity Tolerance Other (comment)  tearful this session      Past Medical History:  Diagnosis Date  . Diabetes mellitus without complication ([place])   . Hyperlipidemia   . Hypertension   . Stroke ([place])   . UTI (urinary tract infection)     Past Surgical History:  Procedure Laterality Date  . BACK SURGERY    . BREAST SURGERY    . COLONOSCOPY WITH PROPOFOL N/A 12/24/2013   Procedure: COLONOSCOPY WITH PROPOFOL;  Surgeon: [name], MD;  Location: [facility];  Service: Endoscopy;  Laterality: N/A;    There were no vitals filed for this visit.      Subjective Assessment - 11/17/16 1517    Subjective  I am wondering what's the point.  Patient very tearful regarding her dependence with mobility in the home.     Pertinent History  PMH CVA, seizures, adhesive capulitis in shoulder, HTN   Limitations elevated BP and orthostatic hypotension at times, monitor BP-previously  Dr Erlinda Hong approved DBP less than 110 and HR 115-120 for therapy paramete   Patient Stated Goals to be more independent   Currently in Pain? Yes   Pain Score 1    Pain Location Hip   Pain Orientation Left   Pain Descriptors / Indicators Burning   Pain Type Chronic pain   Pain Onset More than a month ago   Pain Frequency  Constant   Aggravating Factors  like shingles under the skin   Pain Relieving Factors Lyrica, ice packs, tylenol / advil, lidocane patches                      OT Treatments/Exercises (OP) - 11/17/16 0001      ADLs   Functional Mobility Worked on sit to stand, and static stand balance from bed height.  Patient able to utilize prior strategies and stand with only intermittent assist.     ADL Comments Patient extremely upset today- crying about her lack of independence at home.  Patient requesting to be able to walk 4-5 steps with a walker without having to rely on husband or caregiver.  Offered that patient might be safer to consider being independent from a wheelchair level as walking has consistently required  assistance - variying levels depending on day. Shared patient's concerns with primary PT, and she met with patient and husband.                   OT Education - 11/17/16 1654    Education provided Yes   Education Details suggested neuropsych services   Person(s) Educated Patient   Methods Explanation   Comprehension Verbalized understanding          OT Short Term Goals -  11/01/16 1603      OT SHORT TERM GOAL #1   Title -----------------------------------------------------------------------------------------------------           OT Long Term Goals - 11/08/16 1656      OT LONG TERM GOAL #1   Title Pt/ caregiver will be I with updated HEP- due 12/15/16   Status On-going     OT LONG TERM GOAL #2   Title Pt will be min a for toilet transfers   Status Achieved     OT LONG TERM GOAL #3   Title Pt will perform UB bathing with mod A.   Status On-going     OT LONG TERM GOAL #4   Title Pt will donn shirt with mod A consistently.   Status Deferred     OT LONG TERM GOAL #5   Title Pt / husband will verbalize understanding of LUE positioning to minimize pain and risk for injury.   Status Deferred     OT LONG TERM GOAL #6   Title Pt will  demonstrate ability to complete sit to stand and stand to sit with min a to aide in functional mobility and reduce caregiver burden during ADL activities.    Status On-going     OT LONG TERM GOAL #7   Title Pt will tolerate standing activity for 5 minutes with no more than min a in order to allow for functional tasks.   Status On-going     OT LONG TERM GOAL #8   Title Pt will emonstrate ability for to stand with UE support with supervision during LB ADL's to reduce caregiver burden and maximize independence for pt.    Status On-going               Plan - 11/17/16 1655    Clinical Impression Statement Patient expressing frustration at current level of dependence on caregiver at this time.  Reinforced goals for increased independence with functional transfers.     Rehab Potential Fair   OT Frequency 2x / week   OT Duration 4 weeks   OT Treatment/Interventions Self-care/ADL training;Moist Heat;DME and/or AE instruction;Splinting;Patient/family education;Therapeutic exercises;Compression bandaging;Therapeutic activities;Neuromuscular education;Functional Mobility Training;Passive range of motion;Cognitive remediation/compensation;Electrical Stimulation;Manual Therapy;Dry needling;Visual/perceptual remediation/compensation   Plan sit to stand, toilet transfers, check goals   Consulted and Agree with Plan of Care Patient      Patient will benefit from skilled therapeutic intervention in order to improve the following deficits and impairments:  Decreased activity tolerance, Decreased balance, Decreased cognition, Decreased knowledge of use of DME, Decreased coordination, Decreased endurance, Decreased range of motion, Decreased safety awareness, Decreased skin integrity, Decreased strength, Impaired perceived functional ability, Increased edema, Impaired sensation, Impaired tone, Impaired UE functional use, Impaired vision/preception, Pain, Improper body mechanics, Decreased mobility,  Abnormal gait, Difficulty walking, Decreased knowledge of precautions  Visit Diagnosis: Hemiplegia and hemiparesis following cerebral infarction affecting left non-dominant side (HCC)  Abnormal posture  Muscle weakness (generalized)  Unsteadiness on feet  Chronic left shoulder pain  Cognitive social or emotional deficit following cerebral infarction  Other lack of coordination  Visuospatial deficit    Problem List Patient Active Problem List   Diagnosis Date Noted  . 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 Orthostatic hypotension 04/08/2016  . Muscle cramps 04/08/2016  . Right pontine cerebrovascular accident ([place]) 04/08/2016  . Spastic hemiparesis of left nondominant side ([place]) 04/08/2016  . Cerebral thrombosis with cerebral infarction 04/06/2016  . Basilar artery stenosis   . Dysarthria 04/05/2016  . History of  CVA with residual deficit 04/05/2016  . Sinus tachycardia 04/05/2016  . Acute encephalopathy 04/05/2016  . Chronic pain 04/05/2016  . Other hyperlipidemia 04/05/2016  . Ischemic stroke ([place])   . 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 TIA (transient ischemic attack) 11/07/2014  . Uncontrolled hypertension 11/07/2014  . Controlled type 2 diabetes mellitus with diabetic nephropathy ([place]) 11/07/2014    Mariah Milling, OTR/L 11/17/2016, 4:57 PM  [facility] [address] [place], [place], [address] Phone: [phone]   Fax:  [phone]  Name: Shelly Mcdaniel MRN: [id number] Date of Birth: [birth year]

## 2016-11-17 NOTE — Telephone Encounter (Signed)
Dr. Wynn BankerKirsteins,   PT/OT are [facility].  Note that pt/husband are feeling very overwhelmed and having hard time coping with pts lack of progress and independence.  Feel that they both could benefit from neuropsych consult to address these issues and feelings of depression stated from pt during our session.  Please advise as you are able to Dr. Kieth Brightlyodenbough.    Thanks  Harriet ButteEmily Coda Mathey, PT, MPT [facility] [address] [place], [place], [address] Phone: [phone]   Fax:  [phone] 11/17/16, 8:24 PM

## 2016-11-17 NOTE — Therapy (Signed)
[facility] [address] [place], [place], [address] Phone: [phone]   Fax:  [phone]  Physical Therapy Treatment  Patient Details  Name: Shelly Mcdaniel MRN: [id number] Date of Birth: 02/29/1956 Referring Provider: Claudette LawsAndrew Kirsteins, MD  Encounter Date: 11/17/2016      Shelly Mcdaniel End of Session - 11/17/16 2013    Visit Number 4   Number of Visits 12  will likely only see for 8 visits, pending progress   Date for Shelly Mcdaniel Re-Evaluation 12/19/16   Authorization Type BCBS 0 visit limit, 0 auth   Shelly Mcdaniel Start Time 1535   Shelly Mcdaniel Stop Time 1620   Shelly Mcdaniel Time Calculation (min) 45 min   Activity Tolerance Patient tolerated treatment well   Behavior During Therapy [facility] for tasks assessed/performed      Past Medical History:  Diagnosis Date  . Diabetes mellitus without complication (HCC)   . Hyperlipidemia   . Hypertension   . Stroke (HCC)   . UTI (urinary tract infection)     Past Surgical History:  Procedure Laterality Date  . BACK SURGERY    . BREAST SURGERY    . COLONOSCOPY WITH PROPOFOL N/A 12/24/2013   Procedure: COLONOSCOPY WITH PROPOFOL;  Surgeon: Charolett BumpersMartin K Johnson, MD;  [facility];  Service: Endoscopy;  Laterality: N/A;    There were no vitals filed for this visit.      Subjective Assessment - 11/17/16 2012    Subjective "I just don't know what all this is for."    Patient is accompained by: Family member   Limitations House hold activities;Walking   Patient Stated Goals "I want to work on my left extremity."    Currently in Pain? No/denies           Self Care:  Session focused on addressing mobility concerns with Shelly Mcdaniel and husband.  Note that upon receiving Shelly Mcdaniel from OT, she was very upset and husband Jesusita OkaDan now present for session.  Asked to speak with both of them privately to address concerns.  Both very emotional initially as they had been helping to get [place] packed [place].   They have been very stressed the past few days.  Discussed that Shelly Mcdaniel would like for focus of Shelly Mcdaniel to be becoming more independent from a w/c level.  These have been the goals all along, but feel that it needed to be more directly stated.  Shelly Mcdaniel upset about not being able to walk, however discussed deficits that went into her needing assist for gait.  Also educated on Shelly Mcdaniel needing to "live her life" and if that meant her being as independent as possible from a w/c level, then that is where focus should lie.  Continue to encourage gait with husband at home for many mobility, balance and NMR benefits, however feel that she will likely always need some sort of assist for gait.  With this being said, provided recommendations for use of clear mats at home to allow Shelly Mcdaniel to self propel w/c.  Also encouraged ways of her being more independent from w/c (until she gets independent enough for transfers).  Then discussed that since she would be spending more time in w/c, that proper positioning would be crucial and the need for LLE leg rest, proper cushion and lap tray for LUE would be vital to maintaining active posture and reduced pain.  Both verbalized understanding and appreciative of recommendations.  Also briefly continued to recommend neuropsychology as OT had done previously.  Shelly Mcdaniel Education - 11/17/16 2012    Education provided Yes   Education Details see documentation   Person(s) Educated Patient;Spouse   Methods Explanation   Comprehension Verbalized understanding          Shelly Mcdaniel Short Term Goals - 10/20/16 1934      Shelly Mcdaniel SHORT TERM GOAL #1   Title =LTGs           Shelly Mcdaniel Long Term Goals - 10/20/16 1931      Shelly Mcdaniel LONG TERM GOAL #1   Title Shelly Mcdaniel will perform bed mobility with use of rail if needed at mod I level in order to indicate increased independence with getting in/out of bed.  (Target Date: 12/19/16)   Status New     Shelly Mcdaniel LONG TERM GOAL #2   Title Shelly Mcdaniel will perform  functional transfers at min A level consistently in order to indicate improved independence at home.    Status New     Shelly Mcdaniel LONG TERM GOAL #3   Title Shelly Mcdaniel will perform dynamic standing balance with intermittent UE support at S level x 5 mins in order to indicate increased independence with ADLs.    Status New     Shelly Mcdaniel LONG TERM GOAL #4   Title Shelly Mcdaniel will ambulate x 50' w/ LRAD at min A level consistently in order to indicate increased independence with ADLs at home.    Status New               Plan - 11/17/16 2013    Clinical Impression Statement Session focused on education to Shelly Mcdaniel and husband regarding her being more indepenent from a w/c level.  Shelly Mcdaniel discussed that she feels that Shelly Mcdaniel will likely always need some sort of assist for gait and that Shelly Mcdaniel needs to look to being more independent from the w/c.  Discussed several options for Shelly Mcdaniel to do this at home as well as continuing to work on gait at home as this will carryover to improved postural control, L LE activation and balance.     Rehab Potential Fair   Clinical Impairments Affecting Rehab Potential Shelly Mcdaniel motivated but may be limited due to medical complexity and poor cognition   Shelly Mcdaniel Frequency Other (comment)  12 visits (including eval) but will likely only see for 8 visits)   Shelly Mcdaniel Duration 8 weeks   Shelly Mcdaniel Treatment/Interventions ADLs/Self Care Home Management;Canalith Repostioning;Electrical Stimulation;DME Instruction;Stair training;Gait training;Functional mobility training;Therapeutic activities;Therapeutic exercise;Balance training;Neuromuscular re-education;Patient/family education;Orthotic Fit/Training;Passive range of motion;Energy conservation;Taping;Vestibular;Visual/perceptual remediation/compensation   Shelly Mcdaniel Next Visit Plan move on to sit<>stand and standing balance with ADLs at home, gait (have [name] participate as much as possible for improved carryover)    Consulted and Agree with Plan of Care Patient   Family Member Consulted Husband [name]       Patient will benefit from skilled therapeutic intervention in order to improve the following deficits and impairments:  Abnormal gait, Decreased activity tolerance, Decreased balance, Decreased cognition, Decreased coordination, Decreased endurance, Decreased knowledge of precautions, Decreased knowledge of use of DME, Decreased mobility, Decreased safety awareness, Decreased strength, Impaired perceived functional ability, Impaired flexibility, Impaired sensation, Impaired tone, Impaired UE functional use, Improper body mechanics, Postural dysfunction  Visit Diagnosis: Hemiplegia and hemiparesis following cerebral infarction affecting left non-dominant side (HCC)  Abnormal posture  Muscle weakness (generalized)     Problem List Patient Active Problem List   Diagnosis Date Noted  . 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 Orthostatic hypotension 04/08/2016  . Muscle cramps 04/08/2016  . Right  pontine cerebrovascular accident (HCC) 04/08/2016  . 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 Cerebral thrombosis with cerebral infarction 04/06/2016  . Basilar artery stenosis   . Dysarthria 04/05/2016  . History of CVA with residual deficit 04/05/2016  . Sinus tachycardia 04/05/2016  . Acute encephalopathy 04/05/2016  . Chronic pain 04/05/2016  . Other hyperlipidemia 04/05/2016  . Ischemic stroke (HCC)   . 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 TIA (transient ischemic attack) 11/07/2014  . Uncontrolled hypertension 11/07/2014  . Controlled type 2 diabetes mellitus with diabetic nephropathy (HCC) 11/07/2014    Shelly Mcdaniel, Shelly Mcdaniel, Shelly Mcdaniel [facility] [address] [place], [place], [address] Phone: [phone]   Fax:  [phone] 11/17/16, 8:21 PM  Name: AMILYA HAVER MRN: [id number] Date of Birth: 1956-04-27

## 2016-11-18 NOTE — Telephone Encounter (Signed)
Referral placed.

## 2016-11-18 NOTE — Telephone Encounter (Signed)
Please make referral to Dr Kieth Brightlyodenbough for adjustment

## 2016-11-23 ENCOUNTER — Encounter: Payer: Self-pay | Admitting: Occupational Therapy

## 2016-11-23 ENCOUNTER — Encounter: Payer: Self-pay | Admitting: Physical Therapy

## 2016-11-23 ENCOUNTER — Ambulatory Visit: Payer: BC Managed Care – PPO | Admitting: Physical Therapy

## 2016-11-23 ENCOUNTER — Ambulatory Visit: Payer: BC Managed Care – PPO | Admitting: Occupational Therapy

## 2016-11-23 DIAGNOSIS — I69354 Hemiplegia and hemiparesis following cerebral infarction affecting left non-dominant side: Secondary | ICD-10-CM | POA: Diagnosis not present

## 2016-11-23 DIAGNOSIS — M6281 Muscle weakness (generalized): Secondary | ICD-10-CM

## 2016-11-23 DIAGNOSIS — R293 Abnormal posture: Secondary | ICD-10-CM

## 2016-11-23 DIAGNOSIS — G8929 Other chronic pain: Secondary | ICD-10-CM

## 2016-11-23 DIAGNOSIS — R41842 Visuospatial deficit: Secondary | ICD-10-CM

## 2016-11-23 DIAGNOSIS — M25512 Pain in left shoulder: Secondary | ICD-10-CM

## 2016-11-23 DIAGNOSIS — R278 Other lack of coordination: Secondary | ICD-10-CM

## 2016-11-23 DIAGNOSIS — R2681 Unsteadiness on feet: Secondary | ICD-10-CM

## 2016-11-23 DIAGNOSIS — I69315 Cognitive social or emotional deficit following cerebral infarction: Secondary | ICD-10-CM

## 2016-11-23 NOTE — Therapy (Signed)
[facility] [address] [place], [place], [address] Phone: [phone]   Fax:  [phone]  Occupational Therapy Treatment  Patient Details  Name: Shelly SimmerJody D Condie MRN: [id number] Date of Birth: July 01, 1955 Referring Provider: Dr. Wynn BankerKirsteins  Encounter Date: 11/23/2016      OT End of Session - 11/23/16 1535    Visit Number 6   Number of Visits 8   Date for OT Re-Evaluation 12/15/16   Authorization Type BCBS   Authorization Time Period POC written for 11 visits, however will trial for 8 visits initally, therapy to continue only if pt progressing   OT Start Time 1448   OT Stop Time 1530   OT Time Calculation (min) 42 min   Behavior During Therapy [facility] for tasks assessed/performed      Past Medical History:  Diagnosis Date  . Diabetes mellitus without complication (HCC)   . Hyperlipidemia   . Hypertension   . Stroke (HCC)   . UTI (urinary tract infection)     Past Surgical History:  Procedure Laterality Date  . BACK SURGERY    . BREAST SURGERY    . COLONOSCOPY WITH PROPOFOL N/A 12/24/2013   Procedure: COLONOSCOPY WITH PROPOFOL;  Surgeon: Charolett BumpersMartin K Johnson, MD;  [facility];  Service: Endoscopy;  Laterality: N/A;    There were no vitals filed for this visit.      Subjective Assessment - 11/23/16 1531    Subjective  I want to have a stool with a back in the kitchen because I love to cook.     Pertinent History  PMH CVA, seizures, adhesive capulitis in shoulder, HTN   Limitations elevated BP and orthostatic hypotension at times, monitor BP-previously  Dr Roda ShuttersXu approved DBP less than 110 and HR 115-120 for therapy paramete   Patient Stated Goals to be more independent   Currently in Pain? No/denies   Pain Score 0-No pain                      OT Treatments/Exercises (OP) - 11/23/16 0001      ADLs   Functional Mobility Worked on wheelchair mobility and safety in kitchen environment,  getting in/out of interior doors, opening refrigerator, etc.  Patient needed mod cueing.  Also addressed functional mobillity at wheelchair level on carpet, around obstacles, etc.     Cooking Worked on sit to stand at counter top - using left arm as a weight to help maintain standing position, and using right hand for stirring, pouring, etc.  Worked on dynamic stand balance with LUE gently in support position.  Patient heavily reliant on pulling to stand in this poisition.     ADL Comments Patient indicated she has an appoitnemt to see neuropsych                 OT Education - 11/23/16 1535    Education provided Yes   Education Details wheelchair mobility, standing safety in kitchen at [employer]   Person(s) Educated Patient   Methods Explanation;Demonstration   Comprehension Need further instruction          OT Short Term Goals - 11/01/16 1603      OT SHORT TERM GOAL #1   Title -----------------------------------------------------------------------------------------------------           OT Long Term Goals - 11/08/16 1656      OT LONG TERM GOAL #1   Title Pt/ caregiver will be I with updated HEP- due 12/15/16  Status On-going     OT LONG TERM GOAL #2   Title Pt will be min a for toilet transfers   Status Achieved     OT LONG TERM GOAL #3   Title Pt will perform UB bathing with mod A.   Status On-going     OT LONG TERM GOAL #4   Title Pt will donn shirt with mod A consistently.   Status Deferred     OT LONG TERM GOAL #5   Title Pt / husband will verbalize understanding of LUE positioning to minimize pain and risk for injury.   Status Deferred     OT LONG TERM GOAL #6   Title Pt will demonstrate ability to complete sit to stand and stand to sit with min a to aide in functional mobility and reduce caregiver burden during ADL activities.    Status On-going     OT LONG TERM GOAL #7   Title Pt will tolerate standing activity for 5 minutes with no more than min a  in order to allow for functional tasks.   Status On-going     OT LONG TERM GOAL #8   Title Pt will emonstrate ability for to stand with UE support with supervision during LB ADL's to reduce caregiver burden and maximize independence for pt.    Status On-going               Plan - 11/23/16 1535    Clinical Impression Statement Patient continues to have falls at home, recommending wheelchair level goals to increase independence.     Rehab Potential Fair   OT Frequency 2x / week   OT Duration 4 weeks   OT Treatment/Interventions Self-care/ADL training;Moist Heat;DME and/or AE instruction;Splinting;Patient/family education;Therapeutic exercises;Compression bandaging;Therapeutic activities;Neuromuscular education;Functional Mobility Training;Passive range of motion;Cognitive remediation/compensation;Electrical Stimulation;Manual Therapy;Dry needling;Visual/perceptual remediation/compensation   Plan check goals, toilet transfer, sit to/from stand at counter   Consulted and Agree with Plan of Care Patient      Patient will benefit from skilled therapeutic intervention in order to improve the following deficits and impairments:  Decreased activity tolerance, Decreased balance, Decreased cognition, Decreased knowledge of use of DME, Decreased coordination, Decreased endurance, Decreased range of motion, Decreased safety awareness, Decreased skin integrity, Decreased strength, Impaired perceived functional ability, Increased edema, Impaired sensation, Impaired tone, Impaired UE functional use, Impaired vision/preception, Pain, Improper body mechanics, Decreased mobility, Abnormal gait, Difficulty walking, Decreased knowledge of precautions  Visit Diagnosis: Hemiplegia and hemiparesis following cerebral infarction affecting left non-dominant side (HCC)  Abnormal posture  Unsteadiness on feet  Muscle weakness (generalized)  Chronic left shoulder pain  Cognitive social or emotional deficit  following cerebral infarction  Other lack of coordination  Visuospatial deficit    Problem List Patient Active Problem List   Diagnosis Date Noted  . 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 Orthostatic hypotension 04/08/2016  . Muscle cramps 04/08/2016  . 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 Cerebral thrombosis with cerebral infarction 04/06/2016  . Basilar artery stenosis   . Dysarthria 04/05/2016  . History of CVA with residual deficit 04/05/2016  . Sinus tachycardia 04/05/2016  . Acute encephalopathy 04/05/2016  . Chronic pain 04/05/2016  . Other hyperlipidemia 04/05/2016  . Ischemic stroke (HCC)   . 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 TIA (transient ischemic attack) 11/07/2014  . Uncontrolled hypertension 11/07/2014  . Controlled type 2 diabetes mellitus with diabetic nephropathy (HCC) 11/07/2014    Merleen Milliner  M, OTR/L 11/23/2016, 3:37 PM  [facility] [address] [place], [place], [address] Phone: [phone]   Fax:  [phone]  Name: CLEMENTINA MARENO MRN: [id number] Date of Birth: Jul 05, 1955

## 2016-11-23 NOTE — Therapy (Signed)
[facility] [address] [place], [place], [address] Phone: [phone]   Fax:  [phone]  Physical Therapy Treatment  Patient Details  Name: Shelly Mcdaniel MRN: [id number] Date of Birth: 11/24/1955 Referring Provider: Claudette Laws, MD  Encounter Date: 11/23/2016      PT End of Session - 11/23/16 1705    Visit Number 5   Number of Visits 12  will likely only see for 8 visits, pending progress   Date for PT Re-Evaluation 12/19/16   Authorization Type BCBS 0 visit limit, 0 auth   PT Start Time 1405   PT Stop Time 1448   PT Time Calculation (min) 43 min   Activity Tolerance Patient tolerated treatment well   Behavior During Therapy [facility] for tasks assessed/performed      Past Medical History:  Diagnosis Date  . Diabetes mellitus without complication (HCC)   . Hyperlipidemia   . Hypertension   . Stroke (HCC)   . UTI (urinary tract infection)     Past Surgical History:  Procedure Laterality Date  . BACK SURGERY    . BREAST SURGERY    . COLONOSCOPY WITH PROPOFOL N/A 12/24/2013   Procedure: COLONOSCOPY WITH PROPOFOL;  Surgeon: Charolett Bumpers, MD;  Location: [facility];  Service: Endoscopy;  Laterality: N/A;    There were no vitals filed for this visit.      Subjective Assessment - 11/23/16 1412    Subjective Pt has appointment with neuropsych.  Pt reports she fell yesterday-feet slipped on the carpet-L shoulder sore.  Reports she would like to be able to sit and balance on tall stool with a back in kitchen and use UE to mix scones.     Patient is accompained by: Family member   Limitations House hold activities;Walking   Patient Stated Goals "I want to work on my left extremity."    Currently in Pain? Yes   Pain Location Shoulder   Pain Orientation Left   Pain Descriptors / Indicators Sore   Pain Type Acute pain   Pain Radiating Towards from fall                         [facility]  Adult PT Treatment/Exercise - 11/23/16 1658      Ambulation/Gait   Ambulation/Gait Yes   Ambulation/Gait Assistance 4: Min assist   Ambulation/Gait Assistance Details Ambulated short distances with quad cane with manual facilitation for more upright trunk, proximal stability and weight shifting   Ambulation Distance (Feet) 50 Feet   Assistive device Large base quad cane   Ambulation Surface Level;Indoor     Self-Care   Self-Care Other Self-Care Comments   Other Self-Care Comments  Pt purchased "BOBS" shoes and hopes that after wearing them for some time they will stretch out enough to fit the AFO in.  Educated pt on why BOBS slide on shoes were not appropriate for wear with AFO including shoe depth, flat sole of shoes, increased pressure on L foot due to decreased shoe depth (AFO pressing foot upwards into shoes causing pressure and pain).  Advised pt when just sitting in w/c or furniture she can wear BOBS but when ambulating she needs to wear velcro shoes with AFO.  Pt verbalized understanding     Neuro Re-ed    Neuro Re-ed Details  Seated on low bar stool without back support to simulate sitting at tall bar stool at home to perform mixing/baking tasks at counter  top in kitchen with improved balance.  Performed reaching with RUE forwards (rolling ball out and back along tall table) and to the R and L with focus on maintaining upright trunk (reaching without collapsing to one side) for increased proximal stability when reaching out of BOS.  Also performed reaching to R side without UE support on table x 3 reps with focus on R lateral excursion and elongation maintaining head in midline for postural control.  Continued to focus on proximal stability with maintaining upright trunk in midline while lifting RUE and RLE off of support surfaces and maintaining balance with min A.                  PT Education - 11/23/16 1705    Education provided Yes   Education Details proper shoe wear with  AFO   Person(s) Educated Patient   Methods Explanation   Comprehension Verbalized understanding          PT Short Term Goals - 10/20/16 1934      PT SHORT TERM GOAL #1   Title =LTGs           PT Long Term Goals - 10/20/16 1931      PT LONG TERM GOAL #1   Title Pt will perform bed mobility with use of rail if needed at mod I level in order to indicate increased independence with getting in/out of bed.  (Target Date: 12/19/16)   Status New     PT LONG TERM GOAL #2   Title Pt will perform functional transfers at min A level consistently in order to indicate improved independence at home.    Status New     PT LONG TERM GOAL #3   Title Pt will perform dynamic standing balance with intermittent UE support at S level x 5 mins in order to indicate increased independence with ADLs.    Status New     PT LONG TERM GOAL #4   Title Pt will ambulate x 50' w/ LRAD at min A level consistently in order to indicate increased independence with ADLs at home.    Status New               Plan - 11/23/16 1706    Clinical Impression Statement Treatment session focused on one area of limited ADL performance at home; pt wishes to have better balance to sit on bar stool and assist with mixing/baking tasks.  Performed seated balance, trunk and postural control training seated on bar stool without back support with and then without UE support. Pt tolerated well with no LOB.  Will continue to address.   Rehab Potential Fair   Clinical Impairments Affecting Rehab Potential pt motivated but may be limited due to medical complexity and poor cognition   PT Frequency Other (comment)   PT Duration 8 weeks   PT Treatment/Interventions ADLs/Self Care Home Management;Canalith Repostioning;Electrical Stimulation;DME Instruction;Stair training;Gait training;Functional mobility training;Therapeutic activities;Therapeutic exercise;Balance training;Neuromuscular re-education;Patient/family education;Orthotic  Fit/Training;Passive range of motion;Energy conservation;Taping;Vestibular;Visual/perceptual remediation/compensation   PT Next Visit Plan move on to sit<>stand and standing balance with ADLs at home, gait (have Jesusita Oka participate as much as possible for improved carryover)    Consulted and Agree with Plan of Care Patient      Patient will benefit from skilled therapeutic intervention in order to improve the following deficits and impairments:  Abnormal gait, Decreased activity tolerance, Decreased balance, Decreased cognition, Decreased coordination, Decreased endurance, Decreased knowledge of precautions, Decreased knowledge of use of DME, Decreased  mobility, Decreased safety awareness, Decreased strength, Impaired perceived functional ability, Impaired flexibility, Impaired sensation, Impaired tone, Impaired UE functional use, Improper body mechanics, Postural dysfunction  Visit Diagnosis: Hemiplegia and hemiparesis following cerebral infarction affecting left non-dominant side (HCC)  Abnormal posture  Unsteadiness on feet  Muscle weakness (generalized)     Problem List Patient Active Problem List   Diagnosis Date Noted  . 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 Orthostatic hypotension 04/08/2016  . Muscle cramps 04/08/2016  . 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 Cerebral thrombosis with cerebral infarction 04/06/2016  . Basilar artery stenosis   . Dysarthria 04/05/2016  . History of CVA with residual deficit 04/05/2016  . Sinus tachycardia 04/05/2016  . Acute encephalopathy 04/05/2016  . Chronic pain 04/05/2016  . Other hyperlipidemia 04/05/2016  . Ischemic stroke (HCC)   . 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 TIA (transient ischemic attack) 11/07/2014  . Uncontrolled hypertension 11/07/2014  . Controlled type 2 diabetes mellitus with diabetic nephropathy (HCC) 11/07/2014    Edman CircleAudra Hall, PT,  DPT 11/23/16    5:10 PM    [facility] [address] [place], [place], [address] Phone: [phone]   Fax:  [phone]  Name: Shelly Mcdaniel MRN: [id number] Date of Birth: 03/07/1956

## 2016-11-29 ENCOUNTER — Encounter: Payer: BC Managed Care – PPO | Attending: Physical Medicine & Rehabilitation | Admitting: Psychology

## 2016-11-29 ENCOUNTER — Telehealth: Payer: Self-pay | Admitting: *Deleted

## 2016-11-29 DIAGNOSIS — Z794 Long term (current) use of insulin: Secondary | ICD-10-CM | POA: Insufficient documentation

## 2016-11-29 DIAGNOSIS — I10 Essential (primary) hypertension: Secondary | ICD-10-CM | POA: Insufficient documentation

## 2016-11-29 DIAGNOSIS — R4182 Altered mental status, unspecified: Secondary | ICD-10-CM | POA: Insufficient documentation

## 2016-11-29 DIAGNOSIS — I651 Occlusion and stenosis of basilar artery: Secondary | ICD-10-CM | POA: Insufficient documentation

## 2016-11-29 DIAGNOSIS — I69354 Hemiplegia and hemiparesis following cerebral infarction affecting left non-dominant side: Secondary | ICD-10-CM | POA: Insufficient documentation

## 2016-11-29 DIAGNOSIS — E785 Hyperlipidemia, unspecified: Secondary | ICD-10-CM | POA: Insufficient documentation

## 2016-11-29 DIAGNOSIS — E1140 Type 2 diabetes mellitus with diabetic neuropathy, unspecified: Secondary | ICD-10-CM | POA: Insufficient documentation

## 2016-11-29 NOTE — Telephone Encounter (Signed)
Mr Ambrose MantleHenley called to report that Augusto GambleJody could not come to her appt this morning because she was sick. The call came in at 10:22 and her appt was 9:00 with Dr Kieth Brightlyodenbough.

## 2016-12-01 ENCOUNTER — Ambulatory Visit: Payer: BC Managed Care – PPO | Admitting: Rehabilitation

## 2016-12-01 ENCOUNTER — Ambulatory Visit: Payer: BC Managed Care – PPO | Admitting: Occupational Therapy

## 2016-12-01 ENCOUNTER — Encounter: Payer: Self-pay | Admitting: Rehabilitation

## 2016-12-01 DIAGNOSIS — M6281 Muscle weakness (generalized): Secondary | ICD-10-CM

## 2016-12-01 DIAGNOSIS — I69354 Hemiplegia and hemiparesis following cerebral infarction affecting left non-dominant side: Secondary | ICD-10-CM | POA: Diagnosis not present

## 2016-12-01 DIAGNOSIS — R293 Abnormal posture: Secondary | ICD-10-CM

## 2016-12-01 DIAGNOSIS — R2681 Unsteadiness on feet: Secondary | ICD-10-CM

## 2016-12-01 NOTE — Therapy (Signed)
[facility] [address] [place], [place], [address] Phone: [phone]   Fax:  [phone]  Physical Therapy Treatment  Patient Details  Name: Shelly Mcdaniel MRN: [id number] Date of Birth: December 31, 1955 Referring Provider: Claudette Laws, MD  Encounter Date: 12/01/2016      PT End of Session - 12/01/16 1224    Visit Number 6   Number of Visits 12  will likely only see for 8 visits, pending progress   Date for PT Re-Evaluation 12/19/16   Authorization Type BCBS 0 visit limit, 0 auth   PT Start Time 1100   PT Stop Time 1146   PT Time Calculation (min) 46 min   Activity Tolerance Patient tolerated treatment well   Behavior During Therapy [facility] for tasks assessed/performed      Past Medical History:  Diagnosis Date  . Diabetes mellitus without complication (HCC)   . Hyperlipidemia   . Hypertension   . Stroke (HCC)   . UTI (urinary tract infection)     Past Surgical History:  Procedure Laterality Date  . BACK SURGERY    . BREAST SURGERY    . COLONOSCOPY WITH PROPOFOL N/A 12/24/2013   Procedure: COLONOSCOPY WITH PROPOFOL;  Surgeon: Charolett Bumpers, MD;  Location: [facility];  Service: Endoscopy;  Laterality: N/A;    There were no vitals filed for this visit.      Subjective Assessment - 12/01/16 1112    Subjective Note feeling better after being so dehydrated the past few days.  Got fluid and is better.    Patient is accompained by: Family member   Limitations House hold activities;Walking   Patient Stated Goals "I want to work on my left extremity."    Currently in Pain? No/denies                         [facility] Adult PT Treatment/Exercise - 12/01/16 0001      Transfers   Squat Pivot Transfers 4: Min assist;4: Min guard;With upper extremity assistance;With armrests   Squat Pivot Transfer Details (indicate cue type and reason) Performed x 6 reps total during session from w/c<>mat (not  elevated) in order to work on technique.  Pt continues to require cues for foot placement and forward weight shift, however is doing better with sequencing and initiating forward lean with hand placement.  Really emphasized managing LLE foot rest during session as well.       Medical sales representative Yes   Wheelchair Assistance 5: Supervision;4: Teacher, early years/pre cues for initiation;Tactile cues for sequencing;Verbal cues for sequencing;Verbal cues for technique;Verbal cues for precautions/safety;Verbal cues for safe use of DME/AE   Wheelchair Propulsion Right upper extremity;Right lower extremity   Wheelchair Parts Management Needs assistance   Distance 50   Comments Worked on pt becoming more independent from a w/c level and therefore her self propelling w/c, negotiating turns and over thresholds.  Pt tends to over compensate with trunk movements and under utilize RLE, therefore provided cues for this during session.  Also cued pt regarding proper seating position while in w/c.  Formally assessed w/c cushion and note that it is properly inflated per ischial tuberosity placement.  Also placed two small towel rolls behind back to promote improved posture and improved anterior pelvic tilt.  Also adjusted leg rests to ensure hip and knees were at 90 deg flex.  Discussed this throughout session several times to  improve carryover.   At end of session had pt work on self negotiating L leg rest x 6 reps.  Pt able to do at S level at end of session, will assess carryover at next session.      Self-Care   Self-Care Other Self-Care Comments   Other Self-Care Comments  Discussed all w/c goals during session with pt and husband.  Discussed things for her to work on at home.  I would like for pt to practice setting up w/c for transfers with S as needed and propel throughout the house more throughout the day.                 PT Education - 12/01/16 1223     Education provided Yes   Education Details see self care   Person(s) Educated Patient;Spouse   Methods Explanation   Comprehension Verbalized understanding          PT Short Term Goals - 10/20/16 1934      PT SHORT TERM GOAL #1   Title =LTGs           PT Long Term Goals - 12/01/16 1226      PT LONG TERM GOAL #1   Title Pt will perform bed mobility with use of rail if needed at mod I level in order to indicate increased independence with getting in/out of bed.  (Target Date: 12/19/16)   Status New     PT LONG TERM GOAL #2   Title Pt will perform functional transfers at S level consistently in order to indicate improved independence at home.    Baseline updated due to progress    Status Revised     PT LONG TERM GOAL #3   Title Pt will perform dynamic standing balance with intermittent UE support at S level x 5 mins in order to indicate increased independence with ADLs.    Status New     PT LONG TERM GOAL #4   Title Pt will ambulate x 50' w/ LRAD at min A level consistently in order to indicate increased independence with ADLs at home.    Status New     PT LONG TERM GOAL #5   Title Pt will demonstrate ability to set up w/c for transfer, including leg rest at S level in order to perform transfers more safely.    Status New     Additional Long Term Goals   Additional Long Term Goals Yes     PT LONG TERM GOAL #6   Title Pt will self propel w/c x 75' using R hemi technique at mod I level in order to improve mobility in home.    Status New               Plan - 12/01/16 1224    Clinical Impression Statement Skilled session focused on pt becoming more independent from a w/c level including self managing parts, set up and propelling as well as safety with transfers to/from w/c.  Added LTGs to reflect this.    Rehab Potential Fair   Clinical Impairments Affecting Rehab Potential pt motivated but may be limited due to medical complexity and poor cognition   PT  Frequency Other (comment)   PT Duration 8 weeks   PT Treatment/Interventions ADLs/Self Care Home Management;Canalith Repostioning;Electrical Stimulation;DME Instruction;Stair training;Gait training;Functional mobility training;Therapeutic activities;Therapeutic exercise;Balance training;Neuromuscular re-education;Patient/family education;Orthotic Fit/Training;Passive range of motion;Energy conservation;Taping;Vestibular;Visual/perceptual remediation/compensation   PT Next Visit Plan focus on pt being more independent from w/c level, squat  pivot transfers with independent set up, sit<>stand, standing balance with support,  gait as able (have [name] participate as much as possible for improved carryover)    Consulted and Agree with Plan of Care Patient      Patient will benefit from skilled therapeutic intervention in order to improve the following deficits and impairments:  Abnormal gait, Decreased activity tolerance, Decreased balance, Decreased cognition, Decreased coordination, Decreased endurance, Decreased knowledge of precautions, Decreased knowledge of use of DME, Decreased mobility, Decreased safety awareness, Decreased strength, Impaired perceived functional ability, Impaired flexibility, Impaired sensation, Impaired tone, Impaired UE functional use, Improper body mechanics, Postural dysfunction  Visit Diagnosis: Hemiplegia and hemiparesis following cerebral infarction affecting left non-dominant side (HCC)  Abnormal posture  Unsteadiness on feet  Muscle weakness (generalized)     Problem List Patient Active Problem List   Diagnosis Date Noted  . 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 Orthostatic hypotension 04/08/2016  . Muscle cramps 04/08/2016  . 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 Cerebral thrombosis with cerebral infarction 04/06/2016  . Basilar artery stenosis   . Dysarthria  04/05/2016  . History of CVA with residual deficit 04/05/2016  . Sinus tachycardia 04/05/2016  . Acute encephalopathy 04/05/2016  . Chronic pain 04/05/2016  . Other hyperlipidemia 04/05/2016  . Ischemic stroke (HCC)   . 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 TIA (transient ischemic attack) 11/07/2014  . Uncontrolled hypertension 11/07/2014  . Controlled type 2 diabetes mellitus with diabetic nephropathy (HCC) 11/07/2014    Harriet ButteEmily Zymeir Salminen, PT, MPT [facility] [address] [place], [place], [address] Phone: [phone]   Fax:  [phone] 12/01/16, 12:31 PM  Name: Wendie SimmerJody D Gravelle MRN: [id number] Date of Birth: May 08, 1956

## 2016-12-08 ENCOUNTER — Ambulatory Visit: Payer: BC Managed Care – PPO | Admitting: Occupational Therapy

## 2016-12-08 ENCOUNTER — Ambulatory Visit: Payer: BC Managed Care – PPO | Admitting: Rehabilitation

## 2016-12-08 ENCOUNTER — Encounter: Payer: Self-pay | Admitting: Rehabilitation

## 2016-12-08 DIAGNOSIS — R2681 Unsteadiness on feet: Secondary | ICD-10-CM

## 2016-12-08 DIAGNOSIS — I69354 Hemiplegia and hemiparesis following cerebral infarction affecting left non-dominant side: Secondary | ICD-10-CM

## 2016-12-08 DIAGNOSIS — R293 Abnormal posture: Secondary | ICD-10-CM

## 2016-12-08 DIAGNOSIS — M6281 Muscle weakness (generalized): Secondary | ICD-10-CM

## 2016-12-08 NOTE — Therapy (Signed)
[facility] [address] [place], [place], [address] Phone: [phone]   Fax:  [phone]  Physical Therapy Treatment  Patient Details  Name: Shelly Mcdaniel MRN: [id number] Date of Birth: 05-08-1956 Referring Provider: Alysia Penna, MD  Encounter Date: 12/08/2016      PT End of Session - 12/08/16 1700    Visit Number 7   Number of Visits 12  will likely only see for 8 visits, pending progress   Date for PT Re-Evaluation 12/19/16   Authorization Type BCBS 0 visit limit, 0 auth   PT Start Time 1412  pt arrived late to session   PT Stop Time 1448   PT Time Calculation (min) 36 min   Activity Tolerance Patient tolerated treatment well   Behavior During Therapy [facility] for tasks assessed/performed      Past Medical History:  Diagnosis Date  . Diabetes mellitus without complication ([place])   . Hyperlipidemia   . Hypertension   . Stroke ([place])   . UTI (urinary tract infection)     Past Surgical History:  Procedure Laterality Date  . BACK SURGERY    . BREAST SURGERY    . COLONOSCOPY WITH PROPOFOL N/A 12/24/2013   Procedure: COLONOSCOPY WITH PROPOFOL;  Surgeon: [name], MD;  Location: [facility];  Service: Endoscopy;  Laterality: N/A;    There were no vitals filed for this visit.      Subjective Assessment - 12/08/16 1655    Subjective Pt/husband report missing OT and being late for PT session due to falling asleep in car.    Patient is accompained by: Family member   Limitations House hold activities;Walking   Patient Stated Goals "I want to work on my left extremity."    Currently in Pain? No/denies                         [facility] Adult PT Treatment/Exercise - 12/08/16 0001      Bed Mobility   Bed Mobility Rolling Right   Rolling Right 6: Modified independent (Device/Increase time)   Right Sidelying to Sit 5: Supervision   Right Sidelying to Sit Details (indicate cue type and  reason) min cues for technique     Transfers   Transfers Sit to Stand;Stand to Sit;Squat Pivot Transfers   Sit to Stand 4: Min guard   Sit to Stand Details Verbal cues for sequencing;Verbal cues for technique;Verbal cues for precautions/safety;Tactile cues for initiation;Tactile cues for sequencing;Tactile cues for weight shifting   Stand to Sit 5: Supervision;4: Min guard   Stand to Sit Details (indicate cue type and reason) Verbal cues for technique;Verbal cues for sequencing;Verbal cues for precautions/safety;Tactile cues for initiation;Tactile cues for sequencing;Tactile cues for weight shifting   Stand to Sit Details Performed sit<>stand x 5 reps during session    Squat Pivot Transfers 4: Min guard;4: Min Psychiatric nurse Details (indicate cue type and reason) Continues to require min/guard to min A for squat pivot transfers during session to increase forward weight shift and to elevate on LEs when transferring.  Max cues for set up of w/c and removal of lap tray.       Dynamic Standing Balance   Dynamic Standing - Comments Worked on dynamic standing balance to simulate standing at counter top.  While standing had pt reaching upward and to the R progressing to reaching on table to simulate "wiping counter top" to encourage R lateral weight  shift and decrease pusher tendencies.  Performed x 2 reps of 1-2 mins.  Pt with difficulty finding midline following reaching task and at times requires min A to correct balance.                  PT Education - 12/08/16 1700    Education provided Yes   Education Details importance of having leg rests on chair at all times, D/C upon next visit    Person(s) Educated Patient;Spouse   Methods Explanation   Comprehension Verbalized understanding          PT Short Term Goals - 10/20/16 1934      PT SHORT TERM GOAL #1   Title =LTGs           PT Long Term Goals - 12/08/16 1703      PT LONG TERM GOAL #1   Title Pt will  perform bed mobility with use of rail if needed at mod I level in order to indicate increased independence with getting in/out of bed.  (Target Date: 12/19/16)   Baseline S on 12/08/16 due to cues needed for safety   Status Partially Met     PT LONG TERM GOAL #2   Title Pt will perform functional transfers at S level consistently in order to indicate improved independence at home.    Baseline min/guard to min A on 12/08/16   Status Partially Met     PT LONG TERM GOAL #3   Title Pt will perform dynamic standing balance with intermittent UE support at S level x 5 mins in order to indicate increased independence with ADLs.    Status New     PT LONG TERM GOAL #4   Title Pt will ambulate x 50' w/ LRAD at min A level consistently in order to indicate increased independence with ADLs at home.    Status New     PT LONG TERM GOAL #5   Title Pt will demonstrate ability to set up w/c for transfer, including leg rest at S level in order to perform transfers more safely.    Baseline S level with mod cues 12/08/16   Status Achieved     PT LONG TERM GOAL #6   Title Pt will self propel w/c x 75' using R hemi technique at mod I level in order to improve mobility in home.    Status New               Plan - 12/08/16 1702    Clinical Impression Statement Skilled session focused on functional mobility as it relates to LTGS.  Pt has partially met 2/6 LTGs and met 1 LTG for managing w/c parts with S.  Pt has been very inconsistent with progress and attending therapy as well as demonstrating very little carry over at home.     Rehab Potential Fair   Clinical Impairments Affecting Rehab Potential pt motivated but may be limited due to medical complexity and poor cognition   PT Frequency Other (comment)   PT Duration 8 weeks   PT Treatment/Interventions ADLs/Self Care Home Management;Canalith Repostioning;Electrical Stimulation;DME Instruction;Stair training;Gait training;Functional mobility  training;Therapeutic activities;Therapeutic exercise;Balance training;Neuromuscular re-education;Patient/family education;Orthotic Fit/Training;Passive range of motion;Energy conservation;Taping;Vestibular;Visual/perceptual remediation/compensation   PT Next Visit Plan LTGs and D/C   Consulted and Agree with Plan of Care Patient   Family Member Consulted Husband [name]      Patient will benefit from skilled therapeutic intervention in order to improve the following deficits and impairments:  Abnormal  gait, Decreased activity tolerance, Decreased balance, Decreased cognition, Decreased coordination, Decreased endurance, Decreased knowledge of precautions, Decreased knowledge of use of DME, Decreased mobility, Decreased safety awareness, Decreased strength, Impaired perceived functional ability, Impaired flexibility, Impaired sensation, Impaired tone, Impaired UE functional use, Improper body mechanics, Postural dysfunction  Visit Diagnosis: Hemiplegia and hemiparesis following cerebral infarction affecting left non-dominant side (HCC)  Unsteadiness on feet  Abnormal posture  Muscle weakness (generalized)     Problem List Patient Active Problem List   Diagnosis Date Noted  . 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 Orthostatic hypotension 04/08/2016  . Muscle cramps 04/08/2016  . Right pontine cerebrovascular accident ([place]) 04/08/2016  . Spastic hemiparesis of left nondominant side ([place]) 04/08/2016  . Cerebral thrombosis with cerebral infarction 04/06/2016  . Basilar artery stenosis   . Dysarthria 04/05/2016  . History of CVA with residual deficit 04/05/2016  . Sinus tachycardia 04/05/2016  . Acute encephalopathy 04/05/2016  . Chronic pain 04/05/2016  . Other hyperlipidemia 04/05/2016  . Ischemic stroke ([place])   . 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 TIA (transient ischemic attack) 11/07/2014  . Uncontrolled hypertension 11/07/2014  . Controlled type 2 diabetes mellitus with diabetic  nephropathy ([place]) 11/07/2014    Cameron Sprang, PT, MPT [facility] [address] [place], [place], [address] Phone: [phone]   Fax:  [phone] 12/08/16, 5:07 PM  Name: Shelly Mcdaniel MRN: [id number] Date of Birth: [birth year]

## 2016-12-15 ENCOUNTER — Encounter: Payer: Self-pay | Admitting: Occupational Therapy

## 2016-12-15 ENCOUNTER — Encounter: Payer: Self-pay | Admitting: Rehabilitation

## 2016-12-15 ENCOUNTER — Ambulatory Visit: Payer: BC Managed Care – PPO | Attending: Physical Medicine and Rehabilitation | Admitting: Occupational Therapy

## 2016-12-15 ENCOUNTER — Ambulatory Visit: Payer: BC Managed Care – PPO | Admitting: Rehabilitation

## 2016-12-15 DIAGNOSIS — M6281 Muscle weakness (generalized): Secondary | ICD-10-CM

## 2016-12-15 DIAGNOSIS — I69354 Hemiplegia and hemiparesis following cerebral infarction affecting left non-dominant side: Secondary | ICD-10-CM

## 2016-12-15 DIAGNOSIS — M25512 Pain in left shoulder: Secondary | ICD-10-CM | POA: Insufficient documentation

## 2016-12-15 DIAGNOSIS — R2681 Unsteadiness on feet: Secondary | ICD-10-CM

## 2016-12-15 DIAGNOSIS — R293 Abnormal posture: Secondary | ICD-10-CM | POA: Insufficient documentation

## 2016-12-15 DIAGNOSIS — I69315 Cognitive social or emotional deficit following cerebral infarction: Secondary | ICD-10-CM

## 2016-12-15 DIAGNOSIS — G8929 Other chronic pain: Secondary | ICD-10-CM | POA: Insufficient documentation

## 2016-12-15 NOTE — Therapy (Signed)
[facility] [address] [place], [place], [address] Phone: [phone]   Fax:  [phone]  Physical Therapy Treatment and D/C Summary   Patient Details  Name: Shelly Mcdaniel MRN: [id number] Date of Birth: [birth year] Referring Provider: Alysia Penna, MD  Encounter Date: 12/15/2016      PT End of Session - 12/15/16 1652    Visit Number 8   Number of Visits 12  will likely only see for 8 visits, pending progress   Date for PT Re-Evaluation 12/19/16   Authorization Type BCBS 0 visit limit, 0 auth   PT Start Time 1404   PT Stop Time 1447   PT Time Calculation (min) 43 min   Activity Tolerance Patient tolerated treatment well   Behavior During Therapy [facility] for tasks assessed/performed      Past Medical History:  Diagnosis Date  . Diabetes mellitus without complication ([place])   . Hyperlipidemia   . Hypertension   . Stroke ([place])   . UTI (urinary tract infection)     Past Surgical History:  Procedure Laterality Date  . BACK SURGERY    . BREAST SURGERY    . COLONOSCOPY WITH PROPOFOL N/A 12/24/2013   Procedure: COLONOSCOPY WITH PROPOFOL;  Surgeon: [name], MD;  Location: [facility];  Service: Endoscopy;  Laterality: N/A;    There were no vitals filed for this visit.      Subjective Assessment - 12/15/16 1407    Subjective Pt reports having low blood sugar at beginning of session.    Patient is accompained by: Family member   Limitations House hold activities;Walking   Patient Stated Goals "I want to work on my left extremity."    Currently in Pain? No/denies                         [facility] Adult PT Treatment/Exercise - 12/15/16 0001      Bed Mobility   Bed Mobility Rolling Right   Rolling Right 6: Modified independent (Device/Increase time)   Rolling Left 6: Modified independent (Device/Increase time)   Right Sidelying to Sit 6: Modified independent (Device/Increase time)      Transfers   Transfers Stand to Sit;Sit to [employer]   Sit to Stand 4: Min guard   Sit to Stand Details Verbal cues for sequencing;Verbal cues for technique;Verbal cues for precautions/safety;Tactile cues for initiation;Tactile cues for sequencing;Tactile cues for weight shifting   Stand to Sit 5: Supervision;4: Min guard   Stand to Sit Details (indicate cue type and reason) Verbal cues for technique;Verbal cues for sequencing;Verbal cues for precautions/safety;Tactile cues for initiation;Tactile cues for sequencing;Tactile cues for weight shifting   Squat Pivot Transfers 5: Supervision;4: Min guard   Squat Pivot Transfer Details (indicate cue type and reason) S to the L and min/guard for safety to the R.  Cues for foot placement and set up.       Ambulation/Gait   Ambulation/Gait Yes   Ambulation/Gait Assistance 4: Min assist   Ambulation/Gait Assistance Details Cues for upright posture and increased LLE activation when in stance.  Provided facilitation at trunk/rib cage and pelvis in order to improve posture and forward translation over L LE in stance.     Ambulation Distance (Feet) 115 Feet   Assistive device Large base quad cane   Gait Pattern Step-through pattern;Decreased stance time - left;Decreased hip/knee flexion - left;Decreased dorsiflexion - left;Lateral trunk lean to right;Decreased arm swing - left;Decreased weight  shift to left;Narrow base of support;Left circumduction;Left genu recurvatum;Ataxic   Ambulation Surface Level;Indoor     Chief Technology Officer Yes   Wheelchair Assistance 5: Supervision;4: Research scientist (medical) cues for initiation;Tactile cues for sequencing;Verbal cues for sequencing;Verbal cues for technique;Verbal cues for precautions/safety;Verbal cues for safe use of DME/AE   Wheelchair Propulsion Right upper extremity;Right lower extremity   Wheelchair Parts Management Supervision/cueing    Distance 75   Comments Pt needing assist from PT to get over threshold with MAX verbal cues on how to utilize RUE/LE to do so.  Also needs cues to scan L environment as she would run into objects on L.                  PT Education - 12/15/16 1652    Education provided Yes   Education Details having them continue to practice transfers at home to varying heights, gait as able with min A   Person(s) Educated Patient;Spouse   Methods Explanation   Comprehension Verbalized understanding          PT Short Term Goals - 10/20/16 1934      PT SHORT TERM GOAL #1   Title =LTGs           PT Long Term Goals - 12/15/16 1408      PT LONG TERM GOAL #1   Title Pt will perform bed mobility with use of rail if needed at mod I level in order to indicate increased independence with getting in/out of bed.  (Target Date: 12/19/16)   Baseline mod I on 12/15/16   Status Achieved     PT LONG TERM GOAL #2   Title Pt will perform functional transfers at S level consistently in order to indicate improved independence at home.    Baseline S to the L, min/guard to the R on 12/15/16   Status Partially Met     PT LONG TERM GOAL #3   Title Pt will perform dynamic standing balance with intermittent UE support at S level x 5 mins in order to indicate increased independence with ADLs.    Baseline met 12/15/16 (during OT session)    Status Achieved     PT LONG TERM GOAL #4   Title Pt will ambulate x 50' w/ LRAD at min A level consistently in order to indicate increased independence with ADLs at home.    Baseline 115' with LBQC at min A level on 12/15/16   Status Achieved     PT LONG TERM GOAL #5   Title Pt will demonstrate ability to set up w/c for transfer, including leg rest at S level in order to perform transfers more safely.    Baseline S level with mod cues 12/08/16   Status Achieved     PT LONG TERM GOAL #6   Title Pt will self propel w/c x 75' using R hemi technique at mod I level in order to  improve mobility in home.    Baseline S to min A level on 12/15/16   Status Not Met               Plan - 12/15/16 1653    Clinical Impression Statement Skilled session focused on addressing LTGs.  Pt has met 4/6 LTGs, partially meeting transfer goal, but was unable to meet w/c propulsion goal.  Discussed her continuing to practice these things at home, as well as gait when [name] able to assist.  Rehab Potential Fair   Clinical Impairments Affecting Rehab Potential pt motivated but may be limited due to medical complexity and poor cognition   PT Frequency Other (comment)   PT Duration 8 weeks   PT Treatment/Interventions ADLs/Self Care Home Management;Canalith Repostioning;Electrical Stimulation;DME Instruction;Stair training;Gait training;Functional mobility training;Therapeutic activities;Therapeutic exercise;Balance training;Neuromuscular re-education;Patient/family education;Orthotic Fit/Training;Passive range of motion;Energy conservation;Taping;Vestibular;Visual/perceptual remediation/compensation   Consulted and Agree with Plan of Care Patient   Family Member Consulted Husband [name]      Patient will benefit from skilled therapeutic intervention in order to improve the following deficits and impairments:  Abnormal gait, Decreased activity tolerance, Decreased balance, Decreased cognition, Decreased coordination, Decreased endurance, Decreased knowledge of precautions, Decreased knowledge of use of DME, Decreased mobility, Decreased safety awareness, Decreased strength, Impaired perceived functional ability, Impaired flexibility, Impaired sensation, Impaired tone, Impaired UE functional use, Improper body mechanics, Postural dysfunction  Visit Diagnosis: Hemiplegia and hemiparesis following cerebral infarction affecting left non-dominant side (HCC)  Unsteadiness on feet  Abnormal posture  Muscle weakness (generalized)  PHYSICAL THERAPY DISCHARGE SUMMARY  Visits from Start of  Care: 8  Current functional level related to goals / functional outcomes: See LTGs above   Remaining deficits: Pt continues to have deficits related to dense L hemiplegia, perceptual and visual deficits, and cognitive deficits.     Education / Equipment: HEP  Plan: Patient agrees to discharge.  Patient goals were partially met. Patient is being discharged due to meeting the stated rehab goals.  ?????        Problem List Patient Active Problem List   Diagnosis Date Noted  . 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 Orthostatic hypotension 04/08/2016  . Muscle cramps 04/08/2016  . Right pontine cerebrovascular accident ([place]) 04/08/2016  . Spastic hemiparesis of left nondominant side ([place]) 04/08/2016  . Cerebral thrombosis with cerebral infarction 04/06/2016  . Basilar artery stenosis   . Dysarthria 04/05/2016  . History of CVA with residual deficit 04/05/2016  . Sinus tachycardia 04/05/2016  . Acute encephalopathy 04/05/2016  . Chronic pain 04/05/2016  . Other hyperlipidemia 04/05/2016  . Ischemic stroke ([place])   . 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 TIA (transient ischemic attack) 11/07/2014  . Uncontrolled hypertension 11/07/2014  . Controlled type 2 diabetes mellitus with diabetic nephropathy ([place]) 11/07/2014    Cameron Sprang, PT, MPT [facility] [address] [place], [place], [address] Phone: [phone]   Fax:  [phone] 12/15/16, 4:57 PM  Name: Shelly Mcdaniel MRN: [id number] Date of Birth: 08-02-1955

## 2016-12-15 NOTE — Therapy (Signed)
[facility] [address] [place], [place], [address] Phone: [phone]   Fax:  [phone]  Occupational Therapy Treatment  Patient Details  Name: Shelly Mcdaniel MRN: [id number] Date of Birth: [birth year] Referring Provider: Dr. Letta Pate  Encounter Date: 12/15/2016      OT End of Session - 12/15/16 1514    Visit Number 7   Number of Visits 8   Date for OT Re-Evaluation 12/15/16   Authorization Type BCBS   Authorization Time Period POC written for 11 visits, however will trial for 8 visits initally, therapy to continue only if pt progressing   OT Start Time 1318   OT Stop Time 1359   OT Time Calculation (min) 41 min   Activity Tolerance Patient tolerated treatment well      Past Medical History:  Diagnosis Date  . Diabetes mellitus without complication ([place])   . Hyperlipidemia   . Hypertension   . Stroke ([place])   . UTI (urinary tract infection)     Past Surgical History:  Procedure Laterality Date  . BACK SURGERY    . BREAST SURGERY    . COLONOSCOPY WITH PROPOFOL N/A 12/24/2013   Procedure: COLONOSCOPY WITH PROPOFOL;  Surgeon: [name], MD;  [facility];  Service: Endoscopy;  Laterality: N/A;    There were no vitals filed for this visit.      Subjective Assessment - 12/15/16 1321    Subjective  I certainly have my strength back   Patient is accompained by: Family member  husband   Pertinent History  PMH CVA, seizures, adhesive capulitis in shoulder, HTN   Limitations elevated BP and orthostatic hypotension at times, monitor BP-previously  Dr Erlinda Hong approved DBP less than 110 and HR 115-120 for therapy paramete   Patient Stated Goals to be more independent   Currently in Pain? No/denies                      OT Treatments/Exercises (OP) - 12/15/16 0001      ADLs   Functional Mobility Addressed transfers, sit to stand, static standing balance wiith 1 UE, standing  tolreance, and stand to sit.    ADL Comments Assessed goals - see goals for update.                  OT Short Term Goals - 11/01/16 1603      OT SHORT TERM GOAL #1   Title -----------------------------------------------------------------------------------------------------           OT Long Term Goals - 12/15/16 1513      OT LONG TERM GOAL #1   Title Pt/ caregiver will be I with updated HEP- due 12/15/16   Status Achieved     OT LONG TERM GOAL #2   Title Pt will be min a for toilet transfers   Status Achieved     OT LONG TERM GOAL #3   Title Pt will perform UB bathing with mod A.   Status Deferred     OT LONG TERM GOAL #4   Title Pt will donn shirt with mod A consistently.   Status Deferred     OT LONG TERM GOAL #5   Title Pt / husband will verbalize understanding of LUE positioning to minimize pain and risk for injury.   Status Deferred     OT LONG TERM GOAL #6   Title Pt will demonstrate ability to complete sit to stand and stand to sit with  min a to aide in functional mobility and reduce caregiver burden during ADL activities.    Status Achieved     OT LONG TERM GOAL #7   Title Pt will tolerate standing activity for 5 minutes with no more than min a in order to allow for functional tasks.   Status Achieved     OT LONG TERM GOAL #8   Title Pt will emonstrate ability for to stand with UE support with supervision during LB ADL's to reduce caregiver burden and maximize independence for pt.    Status Achieved               Plan - 12/15/16 1513    Clinical Impression Statement Pt has met all LTG's. Pt states she feels she has gotten all of her strength back.   Rehab Potential Fair   OT Frequency 2x / week   OT Duration 4 weeks   OT Treatment/Interventions Self-care/ADL training;Moist Heat;DME and/or AE instruction;Splinting;Patient/family education;Therapeutic exercises;Compression bandaging;Therapeutic activities;Neuromuscular education;Functional  Mobility Training;Passive range of motion;Cognitive remediation/compensation;Electrical Stimulation;Manual Therapy;Dry needling;Visual/perceptual remediation/compensation   Plan d/c from OT   Consulted and Agree with Plan of Care Patient   Family Member Consulted Husband [name]      Patient will benefit from skilled therapeutic intervention in order to improve the following deficits and impairments:  Decreased activity tolerance, Decreased balance, Decreased cognition, Decreased knowledge of use of DME, Decreased coordination, Decreased endurance, Decreased range of motion, Decreased safety awareness, Decreased skin integrity, Decreased strength, Impaired perceived functional ability, Increased edema, Impaired sensation, Impaired tone, Impaired UE functional use, Impaired vision/preception, Pain, Improper body mechanics, Decreased mobility, Abnormal gait, Difficulty walking, Decreased knowledge of precautions  Visit Diagnosis: Hemiplegia and hemiparesis following cerebral infarction affecting left non-dominant side (HCC)  Unsteadiness on feet  Abnormal posture  Muscle weakness (generalized)  Chronic left shoulder pain  Cognitive social or emotional deficit following cerebral infarction    Problem List Patient Active Problem List   Diagnosis Date Noted  . 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 Orthostatic hypotension 04/08/2016  . Muscle cramps 04/08/2016  . Right pontine cerebrovascular accident ([place]) 04/08/2016  . Spastic hemiparesis of left nondominant side ([place]) 04/08/2016  . Cerebral thrombosis with cerebral infarction 04/06/2016  . Basilar artery stenosis   . Dysarthria 04/05/2016  . History of CVA with residual deficit 04/05/2016  . Sinus tachycardia 04/05/2016  . Acute encephalopathy 04/05/2016  . Chronic pain 04/05/2016  . Other hyperlipidemia 04/05/2016  . Ischemic stroke ([place])   . 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 TIA (transient ischemic attack) 11/07/2014  .  Uncontrolled hypertension 11/07/2014  . Controlled type 2 diabetes mellitus with diabetic nephropathy ([place]) 11/07/2014   OCCUPATIONAL THERAPY DISCHARGE SUMMARY  Visits from Start of Care: 7  Current functional level related to goals / functional outcomes: See above   Remaining deficits: L spastic hemiplegia, cognitive and perceptual deficits, balance deficits   Education / Equipment: HEP  Plan: Patient agrees to discharge.  Patient goals were met. Patient is being discharged due to meeting the stated rehab goals.  ?????      Quay Burow , OTR/L 12/15/2016, 3:16 PM  [facility] [address] [place], [place], [address] Phone: [phone]   Fax:  [phone]  Name: Shelly Mcdaniel MRN: [id number] Date of Birth: Aug 16, 1955

## 2016-12-23 ENCOUNTER — Encounter: Payer: BC Managed Care – PPO | Attending: Physical Medicine & Rehabilitation

## 2016-12-23 ENCOUNTER — Ambulatory Visit (HOSPITAL_BASED_OUTPATIENT_CLINIC_OR_DEPARTMENT_OTHER): Payer: BC Managed Care – PPO | Admitting: Physical Medicine & Rehabilitation

## 2016-12-23 ENCOUNTER — Encounter: Payer: Self-pay | Admitting: Physical Medicine & Rehabilitation

## 2016-12-23 VITALS — BP 160/99 | HR 99

## 2016-12-23 DIAGNOSIS — E785 Hyperlipidemia, unspecified: Secondary | ICD-10-CM | POA: Diagnosis not present

## 2016-12-23 DIAGNOSIS — I10 Essential (primary) hypertension: Secondary | ICD-10-CM | POA: Insufficient documentation

## 2016-12-23 DIAGNOSIS — G8114 Spastic hemiplegia affecting left nondominant side: Secondary | ICD-10-CM

## 2016-12-23 DIAGNOSIS — R4182 Altered mental status, unspecified: Secondary | ICD-10-CM | POA: Insufficient documentation

## 2016-12-23 DIAGNOSIS — I651 Occlusion and stenosis of basilar artery: Secondary | ICD-10-CM | POA: Diagnosis not present

## 2016-12-23 DIAGNOSIS — Z794 Long term (current) use of insulin: Secondary | ICD-10-CM | POA: Insufficient documentation

## 2016-12-23 DIAGNOSIS — I69354 Hemiplegia and hemiparesis following cerebral infarction affecting left non-dominant side: Secondary | ICD-10-CM | POA: Diagnosis not present

## 2016-12-23 DIAGNOSIS — E1140 Type 2 diabetes mellitus with diabetic neuropathy, unspecified: Secondary | ICD-10-CM | POA: Diagnosis not present

## 2016-12-23 NOTE — Patient Instructions (Signed)

## 2016-12-23 NOTE — Progress Notes (Signed)
Botox Injection for spasticity using needle EMG guidance  Dilution: 50 Units/ml Indication: Severe spasticity which interferes with ADL,mobility and/or  hygiene and is unresponsive to medication management and other conservative care Informed consent was obtained after describing risks and benefits of the procedure with the patient. This includes bleeding, bruising, infection, excessive weakness, or medication side effects. A REMS form is on file and signed. Needle: 27-gauge 1 inch needle electrode Number of units per muscle FDS 25 FDP 25 FCR 25 PT 25 All injections were done after obtaining appropriate EMG activity and after negative drawback for blood. The patient tolerated the procedure well. Post procedure instructions were given. A followup appointment was made.

## 2017-01-03 ENCOUNTER — Ambulatory Visit (INDEPENDENT_AMBULATORY_CARE_PROVIDER_SITE_OTHER): Payer: BC Managed Care – PPO | Admitting: Neurology

## 2017-01-03 ENCOUNTER — Encounter: Payer: Self-pay | Admitting: Neurology

## 2017-01-03 VITALS — BP 126/87 | HR 96 | Wt 131.8 lb

## 2017-01-03 DIAGNOSIS — I6930 Unspecified sequelae of cerebral infarction: Secondary | ICD-10-CM | POA: Diagnosis not present

## 2017-01-03 DIAGNOSIS — I639 Cerebral infarction, unspecified: Secondary | ICD-10-CM

## 2017-01-03 DIAGNOSIS — I651 Occlusion and stenosis of basilar artery: Secondary | ICD-10-CM

## 2017-01-03 DIAGNOSIS — I951 Orthostatic hypotension: Secondary | ICD-10-CM | POA: Diagnosis not present

## 2017-01-03 DIAGNOSIS — I6330 Cerebral infarction due to thrombosis of unspecified cerebral artery: Secondary | ICD-10-CM

## 2017-01-03 NOTE — Progress Notes (Signed)
STROKE NEUROLOGY FOLLOW UP NOTE  NAME: Shelly SimmerJody D Quain DOB: November 19, 1955  REASON FOR VISIT: stroke follow up HISTORY FROM: husband and pt and chart  Today we had the pleasure of seeing Shelly Mcdaniel in follow-up at our Neurology Clinic. Pt was accompanied by husband.   History Summary [name] is a 61 y.o. 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 she had right pontine stroke in 01/2016 and was treated in [facility]. TEE and CUS unremarkable and she was discharged with ASA and zocor. She continued to have left hemiparesis. After admission, stroke work up showed again right pontine and b/l cerebellar peduncle infarcts. 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 EEG generalized slowing and no seizure. LDL 110, A1C 6.7. She was put on DAPT and crestor. She was also found to have orthostatic hypotension. Put on TED hose and BP goal 130-150. She was later discharged to CIR.    05/23/16 follow up - the patient has been doing the same. She is now home. She was initially improving on the left hemiparesis but recently developed continued UTI even with antibiotics. As per son, all her improvement has been disappeared due to UTI. Her BP at home lying about 150-170, sitting 130s and standing would drop to 100s. Today in clinic BP 123/85  07/20/16 follow up - pt has been doing better. She had at least UTI x 2, on cipro and recently referred to urology and pending further work up with CT or ultrasound. Fortunately, no sepsis either time. She continued to work with outpt PT/OT and her left LE much improved on strength, however, LUE still flaccid. Working with PT/OT still has intermittent orthostatic hypotension which sometimes aborted her rehab session. However, as per husband, pt BP at home sitting 130/90, lying 150s and standing lower  than 130 but not remeber exact number. She is on metoprolol 25mg  daily. As per husband, daughter who is anesthesiologist [facility] [place] would like pt to have low dose lisinopril for her cardiac and renal protection. BP today in clinic sitting 146/93.   08/31/16 follow up - pt has one episode of staring spells on 08/24/16 lasting about 5min, no shaking jerking or b/b incontinence at that time. Did not check BP. She was sent to ED where she was back to her baseline. Pt can not remember the event. CT head no bleeding. UA negative and CBC BMP normal except K 3.2. She was not put on AEDs at that time.  She was recently discharged from PT/OT and followed with Dr. Wynn BankerKirsteins who does not think pt needs ongoing botox or ritalin treatment. Still has left hemiplegia. BP today 160/104, however, stated her BP at home or other doctor's office always 120-140s. Currently at home, no symptoms with sitting or standing. Working on transition and walking with quad cane at home currently. Pt decreased baclofen and zoloft dose and pt seems more awake alert today in clinic. DM and glucose better controlled. Glucose today 108.   Interval History During the interval time, pt has been pretty much the same. Came in with wheelchair, but able to use cane to transition herself. Continued follow up with Dr. Doroteo BradfordKirstein and received botox injection. Has finished PT/OT. Had recent "stomach virus" infection and gave her setback on neuro improvement. She also has left flank  pain, no shingles, following with pain management, on lyrica, concerning for lumbar radiculopathy. BP today 126/87. As per husband, pt continues to take ASA and plavix and zocor at home. Repeat CTA head and neck showed distal BA only 50% stenosis now, will continue to monitor.   REVIEW OF SYSTEMS: Full 14 system review of systems performed and notable only for those listed below and in HPI above, all others are negative:  Constitutional:  fatigue Cardiovascular:    Ear/Nose/Throat:   Skin:  Eyes:   Respiratory:   Gastroitestinal:  Genitourinary:  Hematology/Lymphatic:   Endocrine:  Musculoskeletal:  Back pain, aching muscles Allergy/Immunology:  Neurological:   Psychiatric:  Sleep:   The following represents the patient's updated allergies and side effects list: Allergies  Allergen Reactions  . Bee Venom Anaphylaxis  . Codeine Itching  . Lipitor [Atorvastatin] Other (See Comments)    Cramping   . Septra [Sulfamethoxazole-Trimethoprim] Other (See Comments)    Mother died from Stevens-Johnson syndrome and sister had anaphylaxis from Septra.Physician told her never to take this medication    The neurologically relevant items on the patient's problem list were reviewed on today's visit.  Neurologic Examination  A problem focused neurological exam (12 or more points of the single system neurologic examination, vital signs counts as 1 point, cranial nerves count for 8 points) was performed.  Blood pressure 126/87, pulse 96, weight 131 lb 12.8 oz (59.8 kg).  General - Well nourished, well developed, in no apparent distress.  Ophthalmologic - Fundi not visualized due to eye movement.  Cardiovascular - Regular rate and rhythm.  Mental Status -  Level of arousal and orientation to time, place and person were intact. Language exam showed mild wording finding difficulty and slow speech, mild dysarthria, but intact naming, repetition, comprehension.  Cranial Nerves II - XII - II - Visual field intact OU. III, IV, VI - Extraocular movements intact. V - Facial sensation intact bilaterally. VII - left facial droop. VIII - Hearing & vestibular intact bilaterally. X - Palate elevates symmetrically, mild dysarthria. XI - Chin turning & shoulder shrug intact bilaterally. XII - Tongue protrusion intact.  Motor Strength - The patient's strength was 5/5 RUE and RLE, but 0/5 LUE and 4/5 LLE proximal but 2/5 DF and PF with left LE brace.  Bulk was normal and fasciculations were absent.   Motor Tone - Muscle tone was assessed at the neck and appendages and was mildly increased LUE.  Reflexes - The patient's reflexes were 1+ in all extremities and she had no pathological reflexes.  Sensory - Light touch, temperature/pinprick were assessed and were symmetrical.    Coordination - The patient had normal movements in the right hand with no ataxia or dysmetria.  Tremor was absent.  Gait and Station - in wheelchair, not tested.    Data reviewed: I personally reviewed the images and agree with the radiology interpretations.  Dg Chest 2 View 04/05/2016 No radiographic evidence of acute cardiopulmonary disease.   Ct Head Wo Contrast 04/05/2016 1. No acute intracranial findings.  2. 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   Mr Brain 77 Contrast Mr Maxine Glenn Head Wo Contrast 04/05/2016 1. Foci of diffusion restriction within the right hemipons and abnormal signal in the middle cerebellar peduncles probably represent areas of acute/early subacute ischemia. No hemorrhage identified.  2. 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  3. Otherwise the circle of Willis is patent with atherosclerotic changes and short segments of mild stenosis in the anterior  and posterior circulation without large vessel occlusion or aneurysm.  4. Paranasal sinus disease predominantly in anterior ethmoid and right frontal sinuses.  5. Stable moderate chronic microvascular ischemic changes of the brain parenchyma.   EEG This is an abnormal EEG due to the presence of mild diffuse generalized slowing with greater intermixed bifrontal slowing. This pattern is nonspecific and consistent with a global encephalopathic process, nonspecific as to etiology. There is no evidence of seizure on the study.  CUS 01/17/2016 - bilateral plaques, but no hemodynamically significant stenosis  TTE - 01/17/16 - mild diastolic  dysfunction with borderline left ventricular hypertrophy with a trace amount of mital regurgitation, trace amount of aortic regurgitation, trace amount of tricuspid regurgitation.  MRI brain 01/17/16 - atrophy and chronic micro-vascular ischemic changes. There is restricted diffusion on the right side of the pons consistent with an acute infarct.   MRI 05/13/16 1. Limited motion degraded brain MRI consisting of diffusion and FLAIR sequences. 2. No new abnormality compared to 04/05/2016. 3. Diffusion hyperintensity from the recent right pontine infarct has resolved as expected, but symmetric hyperintensity persists in the middle cerebellar peduncles. Favor acute Wallerian degeneration which can have this pattern with pontine infarct. Toxic/metabolic derangement, usually described with liver disease or hypoglycemia, or hypertensive complication are alternate considerations.  CT head 08/24/16 -  Old right brainstem infarct. No acute intracranial abnormality. Chronic microvascular disease.  CTA head and neck 09/07/16 Stable vascular evaluation compared to previous studies. Right carotid bifurcation region atherosclerosis but no stenosis. 33% stenosis of the left common carotid artery proximal to the bifurcation. No left carotid bifurcation disease. 33% stenoses in both carotid siphon regions. Right vertebral artery terminates in PICA. 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   Component     Latest Ref Rng & Units 04/05/2016 04/07/2016  Cholesterol     0 - 200 mg/dL  604  Triglycerides     <150 mg/dL  540  HDL Cholesterol     >40 mg/dL  29 (L)  Total CHOL/HDL Ratio     RATIO  5.7  VLDL     0 - 40 mg/dL  25  LDL (calc)     0 - 99 mg/dL  981 (H)  Hemoglobin X9J     4.8 - 5.6 % 6.7 (H)   Mean Plasma Glucose     mg/dL 478   Ammonia     9 - 35 umol/L 14   Vitamin B12     180 - 914 pg/mL  320  Folate      >5.9 ng/mL  21.7    Assessment: As you may recall, she is a 61 y.o. 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 TTE and CUS in 01/2016 unremarkable, EEG generalized slowing and no seizure. LDL 110, A1C 6.7. She was put on DAPT and crestor. She was also found to have orthostatic hypotension. Put on TED hose and BP goal 130-150. She was later discharged to CIR and then home. She was initially improving on the left hemiparesis but recently worsened after developed continued UTI even with antibiotics. Still has orthostatic hypotension at home and pt does not like compression socks. Over time, her condition stabilized,  working with PT/OT and LLE strength much improved. Has frequent UTI and following with urology. Orthostatic hypotension improved. Not tolerating crestor and now on zocor. Had one episode of "staring spells" on 08/19/16, resolved quickly, no AED now. Repeat CTA head and neck showed distal BA 50%, improved from previous MRA .   Plan:  - continue plavix and zocor for stroke prevention - stop ASA  - check BP at home and record. Check orthostatic BPs periodically at home. - will request social worker consult  - Follow up with your primary care physician for stroke risk factor modification. Recommend maintain blood pressure goal 130-150/70-90, diabetes with hemoglobin A1c goal below 7.0% and lipids with LDL cholesterol goal below 70 mg/dL.  - continue pain management for left flank/back pain - will consider PT/OT referral for back pain if not getting better.  - continue home exercise - follow up in 6 months.    I spent more than 25 minutes of face to face time with the patient. Greater than 50% of time was spent in counseling and coordination of care. We  discussed BP management, medication recommendations, seizure precautions.   Orders Placed This Encounter  Procedures  . Ambulatory referral to Social Work    Referral Priority:   Routine    Referral Type:   Consultation    Referral Reason:   Specialty Services Required    Requested Specialty:   Licensed Clinical Social Worker    Number of Visits Requested:   1    Meds ordered this encounter  Medications  . pregabalin (LYRICA) 25 MG capsule    Sig: Take 25 mg by mouth 2 (two) times daily.   . sertraline (ZOLOFT) 50 MG tablet    Sig: takes daily    Refill:  1    Patient Instructions  - continue plavix and zocor for stroke prevention - stop ASA  - check BP at home and record. Check orthostatic BPs periodically at home. - will request social worker consult  - Follow up with your primary care physician for stroke risk factor modification. Recommend maintain blood pressure goal 130-150/70-90, diabetes with hemoglobin A1c goal below 7.0% and lipids with LDL cholesterol goal below 70 mg/dL.  - continue pain management for left flank/back pain - will consider PT/OT referral for back pain if not getting better.  - keep hydrated and avoid dehydration  - continue home exercise - follow up in 6 months.     Marvel Plan, MD PhD [facility] [address], [address] [place], [place] [address] [phone]

## 2017-01-03 NOTE — Patient Instructions (Addendum)
-   continue plavix and zocor for stroke prevention - stop ASA  - check BP at home and record. Check orthostatic BPs periodically at home. - will request social worker consult  - Follow up with your primary care physician for stroke risk factor modification. Recommend maintain blood pressure goal 130-150/70-90, diabetes with hemoglobin A1c goal below 7.0% and lipids with LDL cholesterol goal below 70 mg/dL.  - continue pain management for left flank/back pain - will consider PT/OT referral for back pain if not getting better.  - keep hydrated and avoid dehydration  - continue home exercise - follow up in 6 months.

## 2017-01-11 ENCOUNTER — Emergency Department (HOSPITAL_COMMUNITY)
Admission: EM | Admit: 2017-01-11 | Discharge: 2017-01-11 | Disposition: A | Discharge status: Home / Self Care | Payer: BC Managed Care – PPO | Attending: Emergency Medicine | Admitting: Emergency Medicine

## 2017-01-11 ENCOUNTER — Encounter (HOSPITAL_COMMUNITY): Payer: Self-pay | Admitting: Emergency Medicine

## 2017-01-11 ENCOUNTER — Emergency Department (HOSPITAL_COMMUNITY): Payer: BC Managed Care – PPO

## 2017-01-11 DIAGNOSIS — I10 Essential (primary) hypertension: Secondary | ICD-10-CM | POA: Insufficient documentation

## 2017-01-11 DIAGNOSIS — R1032 Left lower quadrant pain: Secondary | ICD-10-CM | POA: Diagnosis not present

## 2017-01-11 DIAGNOSIS — R102 Pelvic and perineal pain: Secondary | ICD-10-CM | POA: Insufficient documentation

## 2017-01-11 DIAGNOSIS — Z79899 Other long term (current) drug therapy: Secondary | ICD-10-CM | POA: Insufficient documentation

## 2017-01-11 DIAGNOSIS — E119 Type 2 diabetes mellitus without complications: Secondary | ICD-10-CM | POA: Insufficient documentation

## 2017-01-11 DIAGNOSIS — Z794 Long term (current) use of insulin: Secondary | ICD-10-CM | POA: Diagnosis not present

## 2017-01-11 LAB — URINALYSIS, ROUTINE W REFLEX MICROSCOPIC
BACTERIA UA: NONE SEEN
Bilirubin Urine: NEGATIVE
Glucose, UA: NEGATIVE mg/dL
Hgb urine dipstick: NEGATIVE
KETONES UR: NEGATIVE mg/dL
Nitrite: NEGATIVE
PROTEIN: 30 mg/dL — AB
Specific Gravity, Urine: 1.02 (ref 1.005–1.030)
pH: 5 (ref 5.0–8.0)

## 2017-01-11 LAB — CBC WITH DIFFERENTIAL/PLATELET
Basophils Absolute: 0 10*3/uL (ref 0.0–0.1)
Basophils Relative: 0 %
EOS ABS: 0.1 10*3/uL (ref 0.0–0.7)
Eosinophils Relative: 2 %
HCT: 35 % — ABNORMAL LOW (ref 36.0–46.0)
Hemoglobin: 12.4 g/dL (ref 12.0–15.0)
LYMPHS ABS: 1.8 10*3/uL (ref 0.7–4.0)
LYMPHS PCT: 34 %
MCH: 30.1 pg (ref 26.0–34.0)
MCHC: 35.4 g/dL (ref 30.0–36.0)
MCV: 85 fL (ref 78.0–100.0)
MONOS PCT: 6 %
Monocytes Absolute: 0.3 10*3/uL (ref 0.1–1.0)
NEUTROS ABS: 3.1 10*3/uL (ref 1.7–7.7)
NEUTROS PCT: 58 %
PLATELETS: 179 10*3/uL (ref 150–400)
RBC: 4.12 MIL/uL (ref 3.87–5.11)
RDW: 13.1 % (ref 11.5–15.5)
WBC: 5.3 10*3/uL (ref 4.0–10.5)

## 2017-01-11 LAB — COMPREHENSIVE METABOLIC PANEL
ALBUMIN: 3.9 g/dL (ref 3.5–5.0)
ALK PHOS: 62 U/L (ref 38–126)
ALT: 13 U/L — ABNORMAL LOW (ref 14–54)
ANION GAP: 12 (ref 5–15)
AST: 17 U/L (ref 15–41)
BUN: 26 mg/dL — ABNORMAL HIGH (ref 6–20)
CHLORIDE: 103 mmol/L (ref 101–111)
CO2: 25 mmol/L (ref 22–32)
Calcium: 9.6 mg/dL (ref 8.9–10.3)
Creatinine, Ser: 0.77 mg/dL (ref 0.44–1.00)
GFR calc non Af Amer: >60 mL/min (ref >60)
GLUCOSE: 115 mg/dL — AB (ref 65–99)
POTASSIUM: 3.3 mmol/L — AB (ref 3.5–5.1)
SODIUM: 140 mmol/L (ref 135–145)
Total Bilirubin: 0.5 mg/dL (ref 0.3–1.2)
Total Protein: 6.8 g/dL (ref 6.5–8.1)

## 2017-01-11 LAB — LIPASE, BLOOD: Lipase: 19 U/L (ref 11–51)

## 2017-01-11 MED ORDER — HYDROCODONE-ACETAMINOPHEN 5-325 MG PO TABS: 1.0000 | ORAL_TABLET | Freq: Four times a day (QID) | ORAL | 0 refills | Status: DC | PRN

## 2017-01-11 MED ORDER — ONDANSETRON 8 MG PO TBDP: ORAL_TABLET | ORAL | 0 refills | Status: DC

## 2017-01-11 MED ORDER — SODIUM CHLORIDE 0.9 % IV BOLUS (SEPSIS)
500.0000 mL | Freq: Once | INTRAVENOUS | Status: AC
  Administered 2017-01-11: 500 mL via INTRAVENOUS

## 2017-01-11 MED ORDER — IOPAMIDOL (ISOVUE-300) INJECTION 61%
INTRAVENOUS | Status: AC
  Filled 2017-01-11: qty 30

## 2017-01-11 MED ORDER — IOPAMIDOL (ISOVUE-300) INJECTION 61%
100.0000 mL | Freq: Once | INTRAVENOUS | Status: AC | PRN
  Administered 2017-01-11: 80 mL via INTRAVENOUS

## 2017-01-11 MED ORDER — IOPAMIDOL (ISOVUE-300) INJECTION 61%
INTRAVENOUS | Status: DC
Start: 2017-01-11 — End: 2017-01-11
  Filled 2017-01-11: qty 100

## 2017-01-11 NOTE — Discharge Instructions (Signed)
Hydrocodone as prescribed as needed for pain. Zofran as prescribed as needed for nausea. ° °Return to the emergency department if you develop worsening pain, high fevers, bloody stools, or other new and concerning symptoms. °

## 2017-01-11 NOTE — ED Triage Notes (Signed)
Per EMS, pt c/o RLQ abdominal pain and flank pain and altered mental status onset this morning on waking. A&O x 3, disoriented to time. Significant hx multiple UTIs in past. Dark urine yesterday, no urinary frequency, dysuria, or hematuria.

## 2017-01-11 NOTE — ED Notes (Signed)
Patient transported to CT 

## 2017-01-11 NOTE — ED Provider Notes (Signed)
[facility]-EMERGENCY DEPT Provider Note   CSN: [id number] Arrival date & time: 01/11/17  1017     History   Chief Complaint Chief Complaint  Patient presents with  . Abdominal Pain  . Altered Mental Status    HPI Shelly Mcdaniel is a 61 y.o. female.  Patient is a 61 year old female with past mental history of diabetes, hypertension, and prior CVA with residual left hemiparesis. She presents today for evaluation of abdominal pain. She has a history of frequent UTIs, however these usually do not feel like this. She denies any fevers or chills. She denies any constipation or diarrhea. She denies any bloody stool or melena.   The history is provided by the patient.  Abdominal Pain   This is a new problem. The current episode started 2 days ago. The problem occurs constantly. The problem has been gradually worsening. The pain is located in the suprapubic region and LLQ. The quality of the pain is cramping. The pain is moderate. Pertinent negatives include fever, melena and dysuria. Nothing relieves the symptoms.    Past Medical History:  Diagnosis Date  . Diabetes mellitus without complication (HCC)   . Hyperlipidemia   . Hypertension   . Stroke (HCC)   . UTI (urinary tract infection)     Patient Active Problem List   Diagnosis Date Noted  . 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 Orthostatic hypotension 04/08/2016  . Muscle cramps 04/08/2016  . 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 Cerebral thrombosis with cerebral infarction 04/06/2016  . Basilar artery stenosis   . Dysarthria 04/05/2016  . History of CVA with residual deficit 04/05/2016  . Sinus tachycardia 04/05/2016  . Acute encephalopathy 04/05/2016  . Chronic pain 04/05/2016  . Other hyperlipidemia 04/05/2016  . Ischemic stroke (HCC)   . 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 TIA (transient ischemic attack) 11/07/2014  . Uncontrolled  hypertension 11/07/2014  . Controlled type 2 diabetes mellitus with diabetic nephropathy (HCC) 11/07/2014    Past Surgical History:  Procedure Laterality Date  . BACK SURGERY    . BREAST SURGERY    . COLONOSCOPY WITH PROPOFOL N/A 12/24/2013   Procedure: COLONOSCOPY WITH PROPOFOL;  Surgeon: Charolett Bumpers, MD;  Location: [facility] ENDOSCOPY;  Service: Endoscopy;  Laterality: N/A;    OB History    No data available       Home Medications    Prior to Admission medications   Medication Sig Start Date End Date Taking? Authorizing Provider  baclofen (LIORESAL) 10 MG tablet Take 1.5 tablets (15 mg total) by mouth 3 (three) times daily. Patient taking differently: Take 15 mg by mouth 3 (three) times daily.  04/29/16   Angiulli, Mcarthur Rossetti, PA-C  cholecalciferol (VITAMIN D) 1000 units tablet Take 2,000 Units by mouth daily.    [provider]  clopidogrel (PLAVIX) 75 MG tablet Take 1 tablet (75 mg total) by mouth daily. 04/29/16   Angiulli, Mcarthur Rossetti, PA-C  docusate sodium (COLACE) 100 MG capsule Take 100 mg by mouth 2 (two) times daily.     [provider]  LEVEMIR FLEXTOUCH 100 UNIT/ML Pen Inject 25 Units into the skin every evening. Patient taking differently: Inject 18-25 Units into the skin every evening.  04/29/16   Angiulli, Mcarthur Rossetti, PA-C  lidocaine (LIDODERM) 5 % Place 3 patches onto the skin daily. Remove & Discard patch within 12 hours or as directed by MD 04/29/16   Mariam Dollar  J, PA-C  metoprolol succinate (TOPROL-XL) 25 MG 24 hr tablet Take 25 mg by mouth as needed.     [provider]  midodrine (PROAMATINE) 5 MG tablet Take 1 tablet (5 mg total) by mouth 2 (two) times daily with a meal. One in the morning and one in the early afternoon and no later than 4pm Patient taking differently: Take 5 mg by mouth as needed. One in the morning and one in the early afternoon and no later than 4pm 05/23/16   Marvel PlanXu, Jindong, MD  nitrofurantoin (MACRODANTIN) 100 MG  capsule Take 100 mg by mouth 4 (four) times daily.    [provider]  ondansetron (ZOFRAN) 4 MG tablet Take 1 tablet (4 mg total) by mouth every 8 (eight) hours as needed for nausea or vomiting. 06/06/16   Fayrene Helperran, Bowie, PA-C  pantoprazole (PROTONIX) 40 MG tablet Take 40 mg by mouth daily.    [provider]  Phenazopyridine HCl (AZO TABS PO) Take by mouth.    [provider]  polyethylene glycol (MIRALAX / GLYCOLAX) packet Take 17 g by mouth daily as needed for moderate constipation.    [provider]  potassium chloride (K-DUR,KLOR-CON) 10 MEQ tablet Take 1 tablet (10 mEq total) by mouth 2 (two) times daily. Patient taking differently: Take 10 mEq by mouth 2 (two) times daily as needed.  04/29/16   Angiulli, Mcarthur Rossettianiel J, PA-C  pregabalin (LYRICA) 25 MG capsule Take 25 mg by mouth 2 (two) times daily.  11/29/16   [provider]  scopolamine (TRANSDERM-SCOP) 1 MG/3DAYS Place 1 patch (1.5 mg total) onto the skin every 3 (three) days. Patient taking differently: Place 1 patch onto the skin daily as needed (nausea).  04/29/16   Angiulli, Mcarthur Rossettianiel J, PA-C  senna (SENOKOT) 8.6 MG tablet Take 1 tablet by mouth daily as needed for constipation.     [provider]  sertraline (ZOLOFT) 50 MG tablet takes daily 12/22/16   [provider]  simvastatin (ZOCOR) 10 MG tablet Take 10 mg by mouth daily.    [provider]    Family History Family History  Problem Relation Age of Onset  . Stroke Mother   . Dementia Mother   . Diabetes Father   . Stroke Father   . CAD Father   . Diabetes Sister   . Diabetes Brother   . CAD Brother   . Stroke Brother   . Kidney failure Brother   . Hypertension Other   . Hyperlipidemia Other   . Stroke Other   . Heart disease Other   . Diabetes Other     Social History Social History  Substance Use Topics  . Smoking status: Never Smoker  . Smokeless tobacco: Never Used  . Alcohol use No      Allergies   Bee venom; Codeine; Lipitor [atorvastatin]; and Septra [sulfamethoxazole-trimethoprim]   Review of Systems Review of Systems  Constitutional: Negative for fever.  Gastrointestinal: Positive for abdominal pain. Negative for melena.  Genitourinary: Negative for dysuria.  All other systems reviewed and are negative.    Physical Exam Updated Vital Signs BP (!) 151/98 (BP Location: Right Arm)   Pulse 92   Temp 97.8 F (36.6 C) (Oral)   Resp 16   SpO2 100%   Physical Exam  Constitutional: She is oriented to person, place, and time. She appears well-developed and well-nourished. No distress.  HENT:  Head: Normocephalic and atraumatic.  Neck: Normal range of motion. Neck supple.  Cardiovascular:  Normal rate and regular rhythm.  Exam reveals no gallop and no friction rub.   No murmur heard. Pulmonary/Chest: Effort normal and breath sounds normal. No respiratory distress. She has no wheezes.  Abdominal: Soft. Bowel sounds are normal. She exhibits no distension. There is tenderness. There is no rebound and no guarding.  There is ttp in the LLQ and suprapubic region.  Musculoskeletal: Normal range of motion.  Neurological: She is alert and oriented to person, place, and time.  Skin: Skin is warm and dry. She is not diaphoretic.  Nursing note and vitals reviewed.    ED Treatments / Results  Labs (all labs ordered are listed, but only abnormal results are displayed) Labs Reviewed  COMPREHENSIVE METABOLIC PANEL  LIPASE, BLOOD  CBC WITH DIFFERENTIAL/PLATELET  URINALYSIS, ROUTINE W REFLEX MICROSCOPIC    EKG  EKG Interpretation None       Radiology No results found.  Procedures Procedures (including critical care time)  Medications Ordered in ED Medications  sodium chloride 0.9 % bolus 500 mL (not administered)     Initial Impression / Assessment and Plan / ED Course  I have reviewed the triage vital signs and the nursing notes.  Pertinent labs  & imaging results that were available during my care of the patient were reviewed by me and considered in my medical decision making (see chart for details).  Patient presents with lower abdominal discomfort, the etiology of which I am uncertain. Her urinalysis is clear, she has no white count, and CT scan is negative for any acute intra-abdominal process. Her abdominal exam is benign and she appears quite comfortable.  I see no indication for further workup or hospitalization. Her symptoms will be treated and she will be discharged. She understands to return if her symptoms worsen or change.  Final Clinical Impressions(s) / ED Diagnoses   Final diagnoses:  None    New Prescriptions New Prescriptions   No medications on file     Geoffery Lyonselo, Eythan Jayne, MD 01/11/17 1505

## 2017-01-17 ENCOUNTER — Encounter: Payer: Self-pay | Admitting: Psychology

## 2017-01-17 ENCOUNTER — Encounter: Payer: BC Managed Care – PPO | Attending: Physical Medicine & Rehabilitation | Admitting: Psychology

## 2017-01-17 DIAGNOSIS — G8114 Spastic hemiplegia affecting left nondominant side: Secondary | ICD-10-CM | POA: Diagnosis not present

## 2017-01-17 DIAGNOSIS — E1140 Type 2 diabetes mellitus with diabetic neuropathy, unspecified: Secondary | ICD-10-CM | POA: Insufficient documentation

## 2017-01-17 DIAGNOSIS — I69354 Hemiplegia and hemiparesis following cerebral infarction affecting left non-dominant side: Secondary | ICD-10-CM | POA: Diagnosis present

## 2017-01-17 DIAGNOSIS — E785 Hyperlipidemia, unspecified: Secondary | ICD-10-CM | POA: Diagnosis not present

## 2017-01-17 DIAGNOSIS — I10 Essential (primary) hypertension: Secondary | ICD-10-CM | POA: Insufficient documentation

## 2017-01-17 DIAGNOSIS — R4182 Altered mental status, unspecified: Secondary | ICD-10-CM | POA: Insufficient documentation

## 2017-01-17 DIAGNOSIS — Z794 Long term (current) use of insulin: Secondary | ICD-10-CM | POA: Insufficient documentation

## 2017-01-17 DIAGNOSIS — I651 Occlusion and stenosis of basilar artery: Secondary | ICD-10-CM | POA: Insufficient documentation

## 2017-01-17 DIAGNOSIS — F4321 Adjustment disorder with depressed mood: Secondary | ICD-10-CM | POA: Diagnosis not present

## 2017-01-17 NOTE — Progress Notes (Signed)
Neuropsychological Consultation   Patient:   Shelly Mcdaniel   DOB:   07-23-1955  MR Number:  [id number]  Location:  [facility] FOR PAIN AND [facility] MEDICINE [facility] PHYSICAL MEDICINE AND REHABILITATION [address] [place] [address] [id number] [place] [address] Dept: [phone]           Date of Service:   01/17/2017  Start Time:   9 AM End Time:   10 AM  Provider/Observer:  Arley Phenix, Psy.D.       Clinical Neuropsychologist       Billing Code/Service: [phone] 4 Units  Chief Complaint:    Shelly Mcdaniel was referred by Dr. Wynn Banker for a neuropsychological consultation because of significant adjustment issues dating back and due to the residual effects of a stroke suffered on January 12, 2016. This stroke affected primarily the right pontine brain region. She is also had some significant medical issues due to significant urinary tract infections that resulted in significant alterations of consciousness including delusions and dissociative types of experiences.  The patient has been dealing with adjustment issues which are the primary reasons for this referral.  Reason for Service:  Shelly Mcdaniel is a 61 year old female referred for neuropsychological consultation. The patient suffered a stroke on January 15, 2016 affecting primarily the right pontine brain region. She has had medical issues including well-controlled type 2 diabetes but also issues with significant urinary tract infections that were very difficult to get under control. She had alterations of consciousness including delusions and dissociative types of experiences during these infections. Currently, the patient's been having a very hard time dealing with the plateau she is reached as far as her progress. 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 The patient is only able to walk with full assistance from others.  She is not able to hold onto a assist device. The patient reports that she thought that she would be able to keep making progress but now has to adjust to likely always needing assistance. 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 She has had to stop going to school and realizes that she is likely never to reenter this profession that she had hoped so much for.  Current Status:  The patient is having significant adjustment issues due to the plateau in improvement from a subcortical stroke in August 2017. The patient describes herself as having symptoms of depression and appetite changes as well as physical issues related to skin burning and stinging. The patient describes herself as getting very angry about her situation and very impatient. She reports that she would like to come to some resolution and adjustment to her situation. She reports that these issues have been going on for the past 4 months.  Reliability of Information: Information is provided for review of available medical records as well as a 1 hour clinical interview with the patient.  Behavioral Observation: Shelly Mcdaniel  presents as a 61 y.o.-year-old Right Caucasian Female who appeared her stated age. her dress was Appropriate and she was Disheveled and Well Groomed and her manners were Appropriate to the situation.  her participation was indicative of Appropriate and Attentive behaviors.  There were  physical disabilities noted.  she displayed an appropriate level of cooperation and motivation.     Interactions:    Active Appropriate and Attentive  Attention:  within normal limits and attention span and concentration were age appropriate  Memory:   within normal limits; recent and remote memory intact  Visuo-spatial:  within normal limits  Speech (Volume):  normal  Speech:   normal; slurred  Thought Process:  Coherent and Relevant  Though Content:  WNL;    Orientation:   person, place, time/date and situation  Judgment:   Good  Planning:   Good  Affect:    Depressed  Mood:    Depressed  Insight:   Good  Intelligence:   high  Marital Status/Living: The patient was born in [place] and [place] up in [place] She is married and has a 61 year old daughter and a 676 year old son. She currently lives with her husband.  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 The patient has her masters degree and postdoctoral degree.  Past Employment:    Substance Use:  No concerns of substance abuse are reported.    Education:   Aeronautical engineerMasters and postdoctoral degree.  Medical History:   Past Medical History:  Diagnosis Date  . Diabetes mellitus without complication (HCC)   . Hyperlipidemia   . Hypertension   . Stroke (HCC)   . UTI (urinary tract infection)         Abuse/Trauma History: The patient suffering a stroke in August 2017 while on a family vacation has been devastating to her as produced significant residual physical difficulties.  Psychiatric History:  The patient denies any prior psychiatric history.  Family Med/Psych History:  Family History  Problem Relation Age of Onset  . Stroke Mother   . Dementia Mother   . Diabetes Father   . Stroke Father   . CAD Father   . Diabetes Sister   . Diabetes Brother   . CAD Brother   . Stroke Brother   . Kidney failure Brother   . Hypertension Other   . Hyperlipidemia Other   . Stroke Other   . Heart disease Other   . Diabetes Other     Risk of Suicide/Violence: low the patient denies any current suicidal or homicidal ideation.  Impression/DX:  Shelly ParkinsJody Mcdaniel was referred by Dr. Wynn BankerKirsteins for a neuropsychological consultation because of significant adjustment issues dating back and due to the residual effects of a stroke  suffered on January 12, 2016. This stroke affected primarily the right pontine brain region. She is also had some significant medical issues due to significant urinary tract infections that resulted in significant alterations of consciousness including delusions and dissociative types of experiences.  The patient has been dealing with adjustment issues which are the primary reasons for this referral.  Disposition/Plan:  We'll set the patient up for individual therapeutic interventions on a 2 week interval basis.  Diagnosis:    Spastic hemiparesis of left nondominant side (HCC)  Adjustment disorder with depressed mood         Electronically Signed   _______________________ Arley PhenixJohn Doris Gruhn, Psy.D.

## 2017-01-19 ENCOUNTER — Telehealth: Payer: Self-pay | Admitting: Neurology

## 2017-01-19 NOTE — Telephone Encounter (Signed)
Pt's husband called the clinic they have not rec'd a call from homehealth. Please call to advise

## 2017-01-20 NOTE — Telephone Encounter (Signed)
Sent christy a message with [facility] . I will follow up with patient's husband on Monday 01/23/2017.

## 2017-01-24 ENCOUNTER — Other Ambulatory Visit: Payer: Self-pay

## 2017-01-24 ENCOUNTER — Telehealth: Payer: Self-pay | Admitting: Neurology

## 2017-01-24 DIAGNOSIS — I639 Cerebral infarction, unspecified: Secondary | ICD-10-CM

## 2017-01-24 NOTE — Telephone Encounter (Signed)
Pt husband calling back re: something mentioned on pt's last appointment and what was mentioned under Patient Instructions:will request social worker consult , pt husband stating they have not been contacted by anyone re: social security disability and would like a call back

## 2017-01-24 NOTE — Telephone Encounter (Signed)
Pt spouse was called and informed that he would need to contact social security administration for anything relating to disabilty benefits.  Pt husband states that re: the physical therapy and home health these are things that were discussed with Dr Roda ShuttersXu and he wants to speak with him or his RN for resolution on the matter.  Pt husband is looking for the name and number of a Child psychotherapistsocial worker for help. Please call

## 2017-01-24 NOTE — Telephone Encounter (Signed)
[facility] pt has to have PT and OT order first. She will check into the social worker referral. But home health SW may can give them insight on who they can contacted for social security.. Verbal order per Dr. Roda ShuttersXu for social worker.

## 2017-01-24 NOTE — Telephone Encounter (Signed)
Please tell husband that [facility] does not handle social security benefits or issues with it.  A email was sent to us on yesterday from [facility] does not handle or assist with social security benefits. The order for PT and OT was sent per Dr.Xu to [facility].

## 2017-01-25 NOTE — Telephone Encounter (Signed)
[facility] and [facility] will help patient with the right direction to go with the benefits they need.

## 2017-01-25 NOTE — Telephone Encounter (Signed)
I received a VO from Dr. Frances FurbishAthar for a nurse for medication reconciliation. I called [facility] MinisterD[facility] to discuss, no answer, left a message asking him to call us back.

## 2017-01-25 NOTE — Telephone Encounter (Signed)
[facility]helly [facility]cdaniel with [facility] calling stating unable to reconcile medications in home. Requesting verbal order for nurse to come out for medication management. Patient has unfilled Rx's from last hospital visit.

## 2017-01-26 NOTE — Telephone Encounter (Signed)
Thank you very much for your help.   Shelly PlanJindong Indica Marcott, MD PhD Stroke Neurology 01/26/2017 4:52 PM

## 2017-01-26 NOTE — Telephone Encounter (Signed)
I called Dennis back, gave VO per Dr. Frances FurbishAthar for a nurse for med reconciliation for this pt. [facility] MinisterDennis verbalized understanding.

## 2017-01-26 NOTE — Telephone Encounter (Signed)
Occupational Therapist Tommi Emery called with plan of action: 1 time a week for 2 weeks, 0 times a week for 1 week, 1 time a week for 2 weeks, for ADL, transfers, exercise and okay to discharge when goals met or max. potential

## 2017-01-26 NOTE — Telephone Encounter (Signed)
LMVM for Shelly Mcdaniel, [facility] that plan for [facility] is ok.

## 2017-01-27 ENCOUNTER — Encounter: Payer: Self-pay | Admitting: Psychology

## 2017-01-27 ENCOUNTER — Encounter (HOSPITAL_BASED_OUTPATIENT_CLINIC_OR_DEPARTMENT_OTHER): Payer: BC Managed Care – PPO | Admitting: Psychology

## 2017-01-27 DIAGNOSIS — G8114 Spastic hemiplegia affecting left nondominant side: Secondary | ICD-10-CM

## 2017-01-27 DIAGNOSIS — I69354 Hemiplegia and hemiparesis following cerebral infarction affecting left non-dominant side: Secondary | ICD-10-CM | POA: Diagnosis not present

## 2017-01-27 DIAGNOSIS — F4321 Adjustment disorder with depressed mood: Secondary | ICD-10-CM | POA: Diagnosis not present

## 2017-01-27 NOTE — Progress Notes (Signed)
Patient:  Shelly Mcdaniel   DOB: 01-01-1956  MR Number: [id number]  Location: [facility] FOR PAIN AND [facility] MEDICINE [facility] PHYSICAL MEDICINE AND REHABILITATION [address] [place] [address] [id number] [place] [address] Dept: [phone]  Start: 11 AM End: 12 PM  Provider/Observer:     Hershal Coria PSYD  Chief Complaint:      Chief Complaint  Patient presents with  . Depression  . Stress    Reason For Service:     Shelly Mcdaniel is a 61 year old female referred for neuropsychological consultation. The patient suffered a stroke on January 15, 2016 affecting primarily the right pontine brain region. She has had medical issues including well-controlled type 2 diabetes but also issues with significant urinary tract infections that were very difficult to get under control. She had alterations of consciousness including delusions and dissociative types of experiences during these infections. Currently, the patient's been having a very hard time dealing with the plateau she is reached as far as her progress. 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 The patient is only able to walk with full assistance from others. She is not able to hold onto a assist device. The patient reports that she thought that she would be able to keep making progress but now has to adjust to likely always needing assistance. 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 She has had to stop going to school and realizes that she is likely never to reenter this profession that she had hoped so much for.  Interventions Strategy:  Cognitive/behavioral psychotherapeutic interventions  Participation Level:   Active  Participation Quality:  Appropriate and Attentive      Behavioral Observation:  Well Groomed, Alert, and Appropriate.   Current  Psychosocial Factors: The patient reports that she has been working on some of the coping skills and resources we began talking about during her first visit. The patient reports that our discussions in her thinking about processing this information of help her feel less insecure and "meaningless" when she is around others and dealing with her limitations post stroke.  Content of Session:   Reviewed current symptoms and work on therapeutic interventions are in issues of emotional response and adjustment issues and following her stroke.  Current Status:   The patient reports that she has been actually trying to do more and is now being set up for renewed physical and occupational therapies.  Patient Progress:   Very good  Impression/Diagnosis:   Shelly Mcdaniel was referred by Dr. Wynn Banker for a neuropsychological consultation because of significant adjustment issues dating back and due to the residual effects of a stroke suffered on January 12, 2016. This stroke affected primarily the right pontine brain region. She is also had some significant medical issues due to significant urinary tract infections that resulted in significant alterations of consciousness including delusions and dissociative types of experiences.  The patient has been dealing with adjustment issues which are the primary reasons for this referral.  Diagnosis:   Spastic hemiparesis of left nondominant side (HCC)  Adjustment disorder with depressed mood

## 2017-02-01 NOTE — Telephone Encounter (Addendum)
Rn spoke with Buzz at [phone] with [facility]. Rn stated per Dr. Roda Shutters the BP parameters are  120-150 systolic. Rn also stated the pts PCP handles her BP meds. Buzz verbalized understanding.Buzz stated he will let Dr Merlene Laughter know.

## 2017-02-01 NOTE — Telephone Encounter (Signed)
Buzz with [facility] is calling to get BP parameters for home health for patient.

## 2017-02-03 ENCOUNTER — Ambulatory Visit: Payer: BC Managed Care – PPO | Admitting: Physical Medicine & Rehabilitation

## 2017-02-09 ENCOUNTER — Encounter: Payer: Self-pay | Admitting: Physical Medicine & Rehabilitation

## 2017-02-09 ENCOUNTER — Ambulatory Visit (HOSPITAL_BASED_OUTPATIENT_CLINIC_OR_DEPARTMENT_OTHER): Payer: BC Managed Care – PPO | Admitting: Physical Medicine & Rehabilitation

## 2017-02-09 VITALS — BP 141/90 | HR 100 | Resp 14

## 2017-02-09 DIAGNOSIS — I69354 Hemiplegia and hemiparesis following cerebral infarction affecting left non-dominant side: Secondary | ICD-10-CM | POA: Diagnosis not present

## 2017-02-09 DIAGNOSIS — M7502 Adhesive capsulitis of left shoulder: Secondary | ICD-10-CM | POA: Diagnosis not present

## 2017-02-09 DIAGNOSIS — G8114 Spastic hemiplegia affecting left nondominant side: Secondary | ICD-10-CM | POA: Diagnosis not present

## 2017-02-09 NOTE — Patient Instructions (Signed)
Continue home health, increased Botox next visit

## 2017-02-09 NOTE — Progress Notes (Signed)
Subjective:    Patient ID: Shelly Mcdaniel, female    DOB: 1955-10-01, 61 y.o.   MRN: [id number] FDS 25 FDP 25 FCR 25 PT 25 HPI  Home health ordered after hospitalization for UTI PT with HH  RN through [facility] OT aggressive ROM with increased pain in shoulder biceps area Patient is made appointment for me to assess the left upper extremity pain after OT session through home health. No falls No increase in neck pain  Pain Inventory Average Pain 5 Pain Right Now 5 My pain is intermittent and aching  In the last 24 hours, has pain interfered with the following? General activity 10 Relation with others 6 Enjoyment of life 9 What TIME of day is your pain at its worst? morning Sleep (in general) Good  Pain is worse with: bending and some activites Pain improves with: rest, heat/ice and medication Relief from Meds: 5  Mobility walk with assistance use a cane how many minutes can you walk? 15 ability to climb steps?  yes do you drive?  no use a wheelchair needs help with transfers  Function retired I need assistance with the following:  feeding, dressing, bathing, meal prep and household duties  Neuro/Psych weakness trouble walking spasms confusion  Prior Studies Any changes since last visit?  no  Physicians involved in your care Any changes since last visit?  no   Family History  Problem Relation Age of Onset  . Stroke Mother   . Dementia Mother   . Diabetes Father   . Stroke Father   . CAD Father   . Diabetes Sister   . Diabetes Brother   . CAD Brother   . Stroke Brother   . Kidney failure Brother   . Hypertension Other   . Hyperlipidemia Other   . Stroke Other   . Heart disease Other   . Diabetes Other    Social History   Social History  . Marital status: Married    Spouse name: N/A  . Number of children: N/A  . Years of education: N/A   Social History Main Topics  . Smoking status: Never Smoker  . Smokeless tobacco: Never Used  . Alcohol  use No  . Drug use: No  . Sexual activity: Not Asked   Other Topics Concern  . None   Social History Narrative  . None   Past Surgical History:  Procedure Laterality Date  . BACK SURGERY    . BREAST SURGERY    . COLONOSCOPY WITH PROPOFOL N/A 12/24/2013   Procedure: COLONOSCOPY WITH PROPOFOL;  Surgeon: Charolett Bumpers, MD;  Location: [facility];  Service: Endoscopy;  Laterality: N/A;   Past Medical History:  Diagnosis Date  . Diabetes mellitus without complication (HCC)   . Hyperlipidemia   . Hypertension   . Stroke (HCC)   . UTI (urinary tract infection)    BP (!) 141/90 (BP Location: Right Arm, Patient Position: Sitting, Cuff Size: Normal)   Pulse 100   Resp 14   SpO2 98%   Opioid Risk Score:   Fall Risk Score:  `1  Depression screen PHQ 2/9  Depression screen [facility] 2/9 07/14/2016 05/12/2016 01/15/2016  Decreased Interest 0 3 0  Down, Depressed, Hopeless 0 0 0  PHQ - 2 Score 0 3 0  Altered sleeping - 2 -  Tired, decreased energy - 3 -  Feeling bad or failure about yourself  - 0 -  Trouble concentrating - 3 -  Moving slowly or fidgety/restless -  2 -  Suicidal thoughts - 0 -  PHQ-9 Score - 13 -  Difficult doing work/chores - Very difficult -  Some recent data might be hidden    Review of Systems  HENT: Negative.   Eyes: Negative.   Respiratory: Negative.   Cardiovascular: Negative.   Gastrointestinal: Positive for nausea.  Endocrine: Negative.        High blood sugar  Genitourinary: Negative.   Musculoskeletal: Positive for arthralgias and gait problem.       Spasms  Skin: Negative.   Allergic/Immunologic: Negative.   Neurological: Positive for weakness.  Hematological: Negative.   Psychiatric/Behavioral: Positive for confusion.       Objective:   Physical Exam  Constitutional: She is oriented to person, place, and time. She appears well-developed and well-nourished.  HENT:  Head: Normocephalic and atraumatic.  Neurological: She is alert and  oriented to person, place, and time.  Psychiatric: She has a normal mood and affect.  Nursing note and vitals reviewed.   Trace finger flexors Trace elbow flexion 0 elbow extension 0, finger extension  Tone modified Ashworth 3/4 in the elbow flexors, modified Ashworth 2 at the finger flexors and thumb flexor, as well as wrist flexors. MAS 3 in the pronators Lesh shoulder has pain with external rotation      Assessment & Plan:  1.  Left spastic hemiparesis due to CVA  Recommend increased dose brachioradialis50 FDS 50 FDP 25 FCR 25 PT 50  2.  Functional decline after hospitalization for UTI improving with HHPT  3.  Left UE pain after aggressive stretching mainly at elbow flexors, some exacerbation of pain but do not think any imaging study is needed, exam is otherwise unremarkable. She has had increased flexor tone in the past and has had Botox in the past. Most recent injection did not include double flexors as that was not such a big factor. At that time. We'll incorporate brachial radialis into next treatment  4. Frozen shoulder. Some of the ranging done by OT may have aggravated this somewhat as well. Do not think shoulder injection and at this time. However

## 2017-02-23 ENCOUNTER — Encounter: Payer: BC Managed Care – PPO | Attending: Physical Medicine & Rehabilitation | Admitting: Psychology

## 2017-02-23 DIAGNOSIS — E785 Hyperlipidemia, unspecified: Secondary | ICD-10-CM | POA: Insufficient documentation

## 2017-02-23 DIAGNOSIS — I69354 Hemiplegia and hemiparesis following cerebral infarction affecting left non-dominant side: Secondary | ICD-10-CM | POA: Insufficient documentation

## 2017-02-23 DIAGNOSIS — F4321 Adjustment disorder with depressed mood: Secondary | ICD-10-CM

## 2017-02-23 DIAGNOSIS — G8114 Spastic hemiplegia affecting left nondominant side: Secondary | ICD-10-CM | POA: Diagnosis not present

## 2017-02-23 DIAGNOSIS — E1140 Type 2 diabetes mellitus with diabetic neuropathy, unspecified: Secondary | ICD-10-CM | POA: Insufficient documentation

## 2017-02-23 DIAGNOSIS — I651 Occlusion and stenosis of basilar artery: Secondary | ICD-10-CM | POA: Diagnosis not present

## 2017-02-23 DIAGNOSIS — R4182 Altered mental status, unspecified: Secondary | ICD-10-CM | POA: Diagnosis not present

## 2017-02-23 DIAGNOSIS — Z794 Long term (current) use of insulin: Secondary | ICD-10-CM | POA: Diagnosis not present

## 2017-02-23 DIAGNOSIS — I10 Essential (primary) hypertension: Secondary | ICD-10-CM | POA: Insufficient documentation

## 2017-02-27 ENCOUNTER — Telehealth: Payer: Self-pay | Admitting: Neurology

## 2017-02-27 NOTE — Telephone Encounter (Signed)
Left vm for Shelly Mcdaniel from [facility] to call back about verbal orders for physical therapy.

## 2017-02-27 NOTE — Telephone Encounter (Signed)
Dennis/[facility] [phone] called request VO for PT for 2 x 4.

## 2017-02-28 NOTE — Telephone Encounter (Signed)
Left vm and gave verbal order on Dennis vm. The vm states Maurine Minister therapist name is on it. Rn left verbal order message that per DR .Xu, pt can have therapy for 2x 4 weeks.

## 2017-02-28 NOTE — Telephone Encounter (Signed)
2nd vm left for [facility] for [facility] for therapy orders to call back.

## 2017-02-28 NOTE — Telephone Encounter (Signed)
Dennis/[facility] returned RN's call. He is requesting orders to be left on his VM [phone]

## 2017-03-07 NOTE — Progress Notes (Signed)
Patient:  Shelly Mcdaniel   DOB: 10/30/1955  MR Number: [id number]  Location: [facility]INE Shelly Mcdaniel PHYSICAL MEDICINE AND REHABILITATION [id number] [place] [address] Dept: [phone]  Start: 11 AM End: 12 PM  Provider/Observer:     Liev Brockbank R Jyron Turman PSYD  Chief Complaint:      Chief Complaint  Patient presents with  . Depression  . Stress    Reason For Service:     Shelly Mcdaniel is a 60-year-old female referred for neuropsychological consultation. The patient suffered a stroke on January 15, 2016 affecting primarily the right pontine brain region. She has had medical issues including well-controlled type 2 diabetes but also issues with significant urinary tract infections that were very difficult to get under control. She had alterations of consciousness including delusions and dissociative types of experiences during these infections. Currently, the patient's been having a very hard time dealing with the plateau she is reached as far as her progress. 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 The patient is only able to walk with full assistance from others. She is not able to hold onto a assist device. The patient reports that she thought that she would be able to keep making progress but now has to adjust to likely always needing assistance. 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 She has had to stop going to school and realizes that she is likely never to reenter this profession that she had hoped so much for.  Interventions Strategy:  Cognitive/behavioral psychotherapeutic interventions  Participation Level:   Active  Participation Quality:  Appropriate and Attentive      Behavioral Observation:  Well Groomed, Alert, and Appropriate.   Current  Psychosocial Factors: The patient reports that she has been working on some of the coping skills and resources we began talking about during her first visit. The patient reports that our discussions in her thinking about processing this information of help her feel less insecure and "meaningless" when she is around others and dealing with her limitations post stroke.  Content of Session:   Reviewed current symptoms and work on therapeutic interventions are in issues of emotional response and adjustment issues and following her stroke.  Current Status:   The patient reports that she has been actually trying to do more and is now being set up for renewed physical and occupational therapies.  Patient Progress:   Very good  Impression/Diagnosis:   Shelly Mcdaniel was referred by Dr. Kirsteins for a neuropsychological consultation because of significant adjustment issues dating back and due to the residual effects of a stroke suffered on January 12, 2016. This stroke affected primarily the right pontine brain region. She is also had some significant medical issues due to significant urinary tract infections that resulted in significant alterations of consciousness including delusions and dissociative types of experiences.  The patient has been dealing with adjustment issues which are the primary reasons for this referral.  Diagnosis:   Spastic hemiparesis of left nondominant side (HCC)  Adjustment disorder with depressed mood       

## 2017-03-09 ENCOUNTER — Encounter: Payer: BC Managed Care – PPO | Admitting: Psychology

## 2017-03-23 ENCOUNTER — Ambulatory Visit: Payer: BC Managed Care – PPO | Admitting: Physical Medicine & Rehabilitation

## 2017-03-23 ENCOUNTER — Encounter: Payer: BC Managed Care – PPO | Attending: Physical Medicine & Rehabilitation

## 2017-03-23 DIAGNOSIS — E785 Hyperlipidemia, unspecified: Secondary | ICD-10-CM | POA: Insufficient documentation

## 2017-03-23 DIAGNOSIS — I651 Occlusion and stenosis of basilar artery: Secondary | ICD-10-CM | POA: Insufficient documentation

## 2017-03-23 DIAGNOSIS — I10 Essential (primary) hypertension: Secondary | ICD-10-CM | POA: Insufficient documentation

## 2017-03-23 DIAGNOSIS — Z794 Long term (current) use of insulin: Secondary | ICD-10-CM | POA: Insufficient documentation

## 2017-03-23 DIAGNOSIS — R4182 Altered mental status, unspecified: Secondary | ICD-10-CM | POA: Insufficient documentation

## 2017-03-23 DIAGNOSIS — E1140 Type 2 diabetes mellitus with diabetic neuropathy, unspecified: Secondary | ICD-10-CM | POA: Insufficient documentation

## 2017-03-23 DIAGNOSIS — I69354 Hemiplegia and hemiparesis following cerebral infarction affecting left non-dominant side: Secondary | ICD-10-CM | POA: Insufficient documentation

## 2017-04-28 ENCOUNTER — Encounter: Payer: Self-pay | Admitting: Psychology

## 2017-04-28 ENCOUNTER — Encounter: Payer: BC Managed Care – PPO | Attending: Physical Medicine & Rehabilitation | Admitting: Psychology

## 2017-04-28 DIAGNOSIS — E1140 Type 2 diabetes mellitus with diabetic neuropathy, unspecified: Secondary | ICD-10-CM | POA: Insufficient documentation

## 2017-04-28 DIAGNOSIS — Z794 Long term (current) use of insulin: Secondary | ICD-10-CM | POA: Diagnosis not present

## 2017-04-28 DIAGNOSIS — I651 Occlusion and stenosis of basilar artery: Secondary | ICD-10-CM | POA: Insufficient documentation

## 2017-04-28 DIAGNOSIS — I69354 Hemiplegia and hemiparesis following cerebral infarction affecting left non-dominant side: Secondary | ICD-10-CM | POA: Diagnosis present

## 2017-04-28 DIAGNOSIS — I10 Essential (primary) hypertension: Secondary | ICD-10-CM | POA: Insufficient documentation

## 2017-04-28 DIAGNOSIS — G8114 Spastic hemiplegia affecting left nondominant side: Secondary | ICD-10-CM | POA: Diagnosis not present

## 2017-04-28 DIAGNOSIS — E785 Hyperlipidemia, unspecified: Secondary | ICD-10-CM | POA: Diagnosis not present

## 2017-04-28 DIAGNOSIS — F4321 Adjustment disorder with depressed mood: Secondary | ICD-10-CM

## 2017-04-28 DIAGNOSIS — R4182 Altered mental status, unspecified: Secondary | ICD-10-CM | POA: Diagnosis not present

## 2017-04-28 NOTE — Progress Notes (Signed)
Patient:  Shelly Mcdaniel   DOB: 04/12/1956  MR Number: [id number]  [facility] [address], [place] [id number] [place] [id number] Dept: [phone]  Start: 9 AM End: 10 AM  Provider/Observer:     Hershal CoriaJohn R Rodenbough PSYD  Chief Complaint:      Chief Complaint  Patient presents with  . Pain  . Anxiety  . Depression  . Stress    Reason For Service:     Shelly Mcdaniel is a 61 year old female referred for neuropsychological consultation. The patient suffered a stroke on January 15, 2016 affecting primarily the right pontine brain region. She has had medical issues including well-controlled type 2 diabetes but also issues with significant urinary tract infections that were very difficult to get under control. She had alterations of consciousness including delusions and dissociative types of experiences during these infections. Currently, the patient's been having a very hard time dealing with the plateau she is reached as far as her progress. 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 The patient is only able to walk with full assistance from others. She is not able to hold onto a assist device. The patient reports that she thought that she would be able to keep making progress but now has to adjust to likely always needing assistance. 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 She has had to stop going to school and realizes that she is likely never to reenter this profession that she had hoped so much for.  Interventions Strategy:  Cognitive/behavioral psychotherapeutic interventions  Participation Level:   Active  Participation Quality:  Appropriate and Attentive      Behavioral Observation:  Well Groomed, Alert, and  Appropriate.   Current Psychosocial Factors: The patient reports that she has pleaded her application for Social Security disability.  She had been hesitant doing this that she has shown some minor improvements over the past month or 2.  However, she spoke with an advocate who explained to her why it would be appropriate for her to go ahead and make the application now.  The patient reports that this was difficult for her emotionally as it signified an acknowledgment of her deficits and the permanence of them.  Content of Session:   Reviewed current symptoms and work on therapeutic interventions are in issues of emotional response and adjustment issues and following her stroke.  Current Status:   The patient reports that she has continued to work on coping skills and strategies.  The patient reports that her mood has been better and she has had less symptoms of depression anxiety but she has continued to be very disappointed by the loss of her ability to work and things such as nursing or Geneticist, molecularmusic director.    Patient Progress:   Very good  Impression/Diagnosis:   Shelly Mcdaniel was referred by Dr. Wynn BankerKirsteins for a neuropsychological consultation because of significant adjustment issues dating back and due to the residual effects of a stroke suffered on January 12, 2016. This stroke affected primarily the right pontine brain region. She is also had some significant medical issues due to significant urinary tract infections that resulted in significant alterations of consciousness including delusions and dissociative types of experiences.  The patient has been dealing with adjustment issues which  are the primary reasons for this referral.  Diagnosis:   Spastic hemiparesis of left nondominant side (HCC)  Adjustment disorder with depressed mood

## 2017-06-12 ENCOUNTER — Encounter (HOSPITAL_COMMUNITY): Payer: Self-pay

## 2017-06-12 DIAGNOSIS — Z79899 Other long term (current) drug therapy: Secondary | ICD-10-CM | POA: Diagnosis not present

## 2017-06-12 DIAGNOSIS — E119 Type 2 diabetes mellitus without complications: Secondary | ICD-10-CM | POA: Insufficient documentation

## 2017-06-12 DIAGNOSIS — J069 Acute upper respiratory infection, unspecified: Secondary | ICD-10-CM | POA: Diagnosis not present

## 2017-06-12 DIAGNOSIS — Z794 Long term (current) use of insulin: Secondary | ICD-10-CM | POA: Diagnosis not present

## 2017-06-12 DIAGNOSIS — I10 Essential (primary) hypertension: Secondary | ICD-10-CM | POA: Insufficient documentation

## 2017-06-12 DIAGNOSIS — R05 Cough: Secondary | ICD-10-CM | POA: Diagnosis present

## 2017-06-12 NOTE — ED Triage Notes (Signed)
Pt complains of congestion and wheezing for two days, was seen by primary and given OTC meds, now she has a productive cough and the wheezing is worse

## 2017-06-13 ENCOUNTER — Emergency Department (HOSPITAL_COMMUNITY): Payer: BC Managed Care – PPO

## 2017-06-13 ENCOUNTER — Emergency Department (HOSPITAL_COMMUNITY)
Admission: EM | Admit: 2017-06-13 | Discharge: 2017-06-13 | Disposition: A | Discharge status: Home / Self Care | Payer: BC Managed Care – PPO | Attending: Emergency Medicine | Admitting: Emergency Medicine

## 2017-06-13 DIAGNOSIS — J069 Acute upper respiratory infection, unspecified: Secondary | ICD-10-CM

## 2017-06-13 DIAGNOSIS — R05 Cough: Secondary | ICD-10-CM

## 2017-06-13 DIAGNOSIS — R059 Cough, unspecified: Secondary | ICD-10-CM

## 2017-06-13 MED ORDER — ALBUTEROL SULFATE HFA 108 (90 BASE) MCG/ACT IN AERS: 1.0000 | INHALATION_SPRAY | Freq: Four times a day (QID) | RESPIRATORY_TRACT | 0 refills | Status: AC | PRN

## 2017-06-13 MED ORDER — BENZONATATE 100 MG PO CAPS: 100.0000 mg | ORAL_CAPSULE | Freq: Three times a day (TID) | ORAL | 0 refills | Status: DC

## 2017-06-13 MED ORDER — IPRATROPIUM-ALBUTEROL 0.5-2.5 (3) MG/3ML IN SOLN
3.0000 mL | Freq: Once | RESPIRATORY_TRACT | Status: AC
  Administered 2017-06-13: 3 mL via RESPIRATORY_TRACT
  Filled 2017-06-13: qty 3

## 2017-06-13 NOTE — ED Provider Notes (Signed)
[facility]-EMERGENCY DEPT Provider Note   CSN: [id number] Arrival date & time: 06/12/17  2230     History   Chief Complaint Chief Complaint  Patient presents with  . URI    HPI Shelly Mcdaniel is a 62 y.o. female past medical history of hypertension, stroke, diabetes who presents for evaluation of 2 days of nasal congestion, rhinorrhea, cough.  Patient also reports that today she started developing some wheezing.  Patient reports that she was seen by her primary care doctor yesterday for evaluation of symptoms.  At that time she was prescribed some over-the-counter nasal spray.  She reports she has not had any improvement in symptoms after using the nasal spray.  Patient became concerned when she was wheezing today, prompting ED visit.  Patient reports that cough is intermittently dry and productive of yellowish phlegm.  No hemoptysis.  She reports some secondary chest soreness that only occurs with coughing.  No chest pain at rest.  No difficulty breathing.  Patient is not a current smoker.  She denies any history of asthma or COPD.  Patient denies any fevers, chest pain, difficulty breathing, abdominal pain, leg swelling.  The history is provided by the patient.    Past Medical History:  Diagnosis Date  . Diabetes mellitus without complication (HCC)   . Hyperlipidemia   . Hypertension   . Stroke (HCC)   . UTI (urinary tract infection)     Patient Active Problem List   Diagnosis Date Noted  . 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 Orthostatic hypotension 04/08/2016  . Muscle cramps 04/08/2016  . 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 Cerebral thrombosis with cerebral infarction 04/06/2016  . Basilar artery stenosis   . Dysarthria 04/05/2016  . History of CVA with residual deficit 04/05/2016  . Sinus tachycardia 04/05/2016  . Acute encephalopathy 04/05/2016    . Chronic pain 04/05/2016  . Other hyperlipidemia 04/05/2016  . Ischemic stroke (HCC)   . 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 TIA (transient ischemic attack) 11/07/2014  . Uncontrolled hypertension 11/07/2014  . Controlled type 2 diabetes mellitus with diabetic nephropathy (HCC) 11/07/2014    Past Surgical History:  Procedure Laterality Date  . BACK SURGERY    . BREAST SURGERY    . COLONOSCOPY WITH PROPOFOL N/A 12/24/2013   Procedure: COLONOSCOPY WITH PROPOFOL;  Surgeon: Charolett Bumpers, MD;  Location: [facility] ENDOSCOPY;  Service: Endoscopy;  Laterality: N/A;    OB History    No data available       Home Medications    Prior to Admission medications   Medication Sig Start Date End Date Taking? Authorizing Provider  acetaminophen (TYLENOL) 500 MG tablet Take 1,000 mg by mouth every 6 (six) hours as needed for moderate pain.   Yes [provider]  baclofen (LIORESAL) 10 MG tablet Take 1.5 tablets (15 mg total) by mouth 3 (three) times daily. Patient taking differently: Take 10 mg by mouth at bedtime.  04/29/16  Yes Angiulli, Mcarthur Rossetti, PA-C  clopidogrel (PLAVIX) 75 MG tablet Take 1 tablet (75 mg total) by mouth daily. 04/29/16  Yes Angiulli, Mcarthur Rossetti, PA-C  hydroxypropyl methylcellulose / hypromellose (ISOPTO TEARS / GONIOVISC) 2.5 % ophthalmic solution Place 1 drop into both eyes 3 (three) times daily as needed for dry eyes.   Yes [provider]  ibuprofen (ADVIL,MOTRIN) 200 MG tablet Take 600 mg by mouth every 6 (six) hours as needed  for moderate pain.   Yes [provider]  insulin aspart (NOVOLOG) 100 UNIT/ML injection Inject 4 Units into the skin 3 (three) times daily as needed for high blood sugar. Only if B/S is over 275   Yes [provider]  LEVEMIR FLEXTOUCH 100 UNIT/ML Pen Inject 25 Units into the skin every evening. Patient taking differently: Inject 10-22 Units into the skin every evening.  04/29/16  Yes Angiulli, Mcarthur Rossetti, PA-C  lidocaine (LIDODERM) 5  % Place 3 patches onto the skin daily. Remove & Discard patch within 12 hours or as directed by MD 04/29/16  Yes Angiulli, Mcarthur Rossetti, PA-C  Melatonin 3 MG TABS Take 3-6 mg by mouth at bedtime as needed. For sleep   Yes [provider]  ondansetron (ZOFRAN) 4 MG tablet Take 1 tablet (4 mg total) by mouth every 8 (eight) hours as needed for nausea or vomiting. 06/06/16  Yes Fayrene Helper, PA-C  Phenazopyridine HCl (AZO TABS PO) Take 1 tablet by mouth 3 (three) times daily as needed. Bladder pain   Yes [provider]  pregabalin (LYRICA) 25 MG capsule Take 50 mg by mouth at bedtime.  11/29/16  Yes [provider]  scopolamine (TRANSDERM-SCOP) 1 MG/3DAYS Place 1 patch (1.5 mg total) onto the skin every 3 (three) days. Patient taking differently: Place 1 patch onto the skin daily as needed (nausea).  04/29/16  Yes Angiulli, Mcarthur Rossetti, PA-C  senna-docusate (SENOKOT-S) 8.6-50 MG tablet Take 1 tablet by mouth at bedtime.   Yes [provider]  sertraline (ZOLOFT) 50 MG tablet Take 50 mg by mouth daily.   Yes [provider]  simvastatin (ZOCOR) 40 MG tablet Take 40 mg by mouth every evening.   Yes [provider]  albuterol (PROVENTIL HFA;VENTOLIN HFA) 108 (90 Base) MCG/ACT inhaler Inhale 1-2 puffs into the lungs every 6 (six) hours as needed for wheezing or shortness of breath. 06/13/17   Maxwell Caul, PA-C  benzonatate (TESSALON) 100 MG capsule Take 1 capsule (100 mg total) by mouth every 8 (eight) hours. 06/13/17   Maxwell Caul, PA-C  metFORMIN (GLUCOPHAGE-XR) 500 MG 24 hr tablet Take 1,000 mg by mouth every evening.    [provider]  midodrine (PROAMATINE) 5 MG tablet Take 1 tablet (5 mg total) by mouth 2 (two) times daily with a meal. One in the morning and one in the early afternoon and no later than 4pm Patient not taking: Reported on 06/13/2017 05/23/16   Marvel Plan, MD  ondansetron (ZOFRAN ODT) 8 MG disintegrating tablet 8mg  ODT q4  hours prn nausea Patient not taking: Reported on 06/13/2017 01/11/17   Geoffery Lyons, MD    Family History Family History  Problem Relation Age of Onset  . Stroke Mother   . Dementia Mother   . Diabetes Father   . Stroke Father   . CAD Father   . Diabetes Sister   . Diabetes Brother   . CAD Brother   . Stroke Brother   . Kidney failure Brother   . Hypertension Other   . Hyperlipidemia Other   . Stroke Other   . Heart disease Other   . Diabetes Other     Social History Social History   Tobacco Use  . Smoking status: Never Smoker  . Smokeless tobacco: Never Used  Substance Use Topics  . Alcohol use: No    Alcohol/week: 0.0 oz  . Drug use: No     Allergies   Bee venom; Codeine;  Lipitor [atorvastatin]; and Septra [sulfamethoxazole-trimethoprim]   Review of Systems Review of Systems  Constitutional: Negative for fever.  Respiratory: Positive for cough and wheezing. Negative for shortness of breath.   Cardiovascular: Negative for chest pain.  Gastrointestinal: Negative for abdominal pain, nausea and vomiting.  Genitourinary: Negative for dysuria and hematuria.  Neurological: Negative for headaches.     Physical Exam Updated Vital Signs BP (!) 169/92 (BP Location: Left Arm)   Pulse 95   Temp 98.4 F (36.9 C) (Oral)   Resp 16   Wt 78 kg (172 lb)   SpO2 93%   BMI 25.77 kg/m   Physical Exam  Constitutional: She is oriented to person, place, and time. She appears well-developed and well-nourished.  Patient intermittently coughing throughout oexam but otherwise in NAD  HENT:  Head: Normocephalic and atraumatic.  Nose: Mucosal edema and rhinorrhea present.  Mouth/Throat: Oropharynx is clear and moist and mucous membranes are normal.  Eyes: Conjunctivae, EOM and lids are normal. Pupils are equal, round, and reactive to light.  Neck: Full passive range of motion without pain.  Cardiovascular: Normal rate, regular rhythm, normal heart sounds and normal pulses.  Exam reveals no gallop and no friction rub.  No murmur heard. Pulses:      Dorsalis pedis pulses are 2+ on the right side, and 2+ on the left side.  Pulmonary/Chest: Effort normal. She has no decreased breath sounds. She has wheezes.  Very faint wheezing noted throughout the upper lung fields. No evidence of respiratory distress. Able to speak in full sentences without difficulty.  Abdominal: Soft. Normal appearance. There is no tenderness. There is no rigidity and no guarding.  Musculoskeletal: Normal range of motion.  Bilateral lower extremities are symmetric in appearance.  Neurological: She is alert and oriented to person, place, and time.  Skin: Skin is warm and dry. Capillary refill takes less than 2 seconds.  Psychiatric: She has a normal mood and affect. Her speech is normal.  Nursing note and vitals reviewed.    ED Treatments / Results  Labs (all labs ordered are listed, but only abnormal results are displayed) Labs Reviewed - No data to display  EKG  EKG Interpretation None       Radiology Dg Chest 2 View  Result Date: 06/13/2017 CLINICAL DATA:  Acute onset of cough and shortness of breath. Wheezing. EXAM: CHEST  2 VIEW COMPARISON:  Chest radiograph and CTA of the chest performed 05/13/2016 FINDINGS: The lungs are well-aerated. Mild vascular congestion is noted. Mild bibasilar opacities raise concern for mild interstitial edema. Small bilateral pleural effusions are seen. There is no evidence of pneumothorax. The heart is normal in size; the mediastinal contour is within normal limits. No acute osseous abnormalities are seen. IMPRESSION: Mild vascular congestion. Mild bibasilar opacities raise concern for mild interstitial edema. Small bilateral pleural effusions seen. Electronically Signed   By: Roanna RaiderJeffery  Chang M.D.   On: 06/13/2017 01:33    Procedures Procedures (including critical care time)  Medications Ordered in ED Medications  ipratropium-albuterol (DUONEB)  0.5-2.5 (3) MG/3ML nebulizer solution 3 mL (3 mLs Nebulization Given 06/13/17 0136)     Initial Impression / Assessment and Plan / ED Course  I have reviewed the triage vital signs and the nursing notes.  Pertinent labs & imaging results that were available during my care of the patient were reviewed by me and considered in my medical decision making (see chart for details).     62 y.o. F who presents for evaluation of  2 days of nasal congestion, cough, rhinorrhea.  Seen by PCP yesterday and given nasal mist spray. Patient reports that today she felt like she was wheezing, prompting ED visit. No CP, SOB, fevers, nausea/vomiting. Patient is afebrile, non-toxic appearing, sitting comfortably on examination table. Vital signs reviewed and stable. On exam patient has some faint wheezing noted, particularly to the upper lung fields. No evidence of respiratory distress. O2 sats are >95% on RA. Consider acute infectious etiology. Vs bronchitis. History/physical exam are not concerning for CHF, PE, ACS etiology.  Given wheezing noted on exam, will plan to do a nebulizer treatment here in the department.  Wiill plan for chest x-ray for evaluation of pneumonia.  Chest x-ray shows mild vascular congestion.  There is concerned that there may be some mild bibasilar opacities indicative of mild interstitial edema.  Patient also has small bilateral pleural effusions.  Discussed results with patient.  Reevaluation after nebulizer treatment.  Wheezing has resolved.  Patient reports that she feels better after nebulizer treatment.  Discussed with her regarding results on chest x-ray.  Patient does not have any history of cancer and denies any recent fever, weight loss, night sweats.  Do not suspect CHF based on history/physical exam.  Instructed patient that she will need to follow-up with her primary care doctor regarding findings on chest x-ray and will need to closely monitor symptoms.  Suspect that patient's symptoms  today are due to acute bronchitis.  We will plan to give symptom medic treatment for relief.  Patient with no evidence of respiratory distress.  Vital signs are stable.  Instructed patient to follow-up primary care doctor the next 24-48 hours for further evaluation. Patient had ample opportunity for questions and discussion. All patient's questions were answered with full understanding. Strict return precautions discussed. Patient expresses understanding and agreement to plan.    Final Clinical Impressions(s) / ED Diagnoses   Final diagnoses:  Upper respiratory tract infection, unspecified type  Cough    ED Discharge Orders        Ordered    albuterol (PROVENTIL HFA;VENTOLIN HFA) 108 (90 Base) MCG/ACT inhaler  Every 6 hours PRN     06/13/17 0251    benzonatate (TESSALON) 100 MG capsule  Every 8 hours     06/13/17 0251       Maxwell Caul, PA-C 06/14/17 1437    Azalia Bilis, MD 06/18/17 2313

## 2017-06-13 NOTE — Discharge Instructions (Signed)
As we discussed, you need to follow-up with your primary care doctor regarding the findings that were found in her chest x-ray today.  As we discussed, these findings need to be evaluated have a repeat chest x-ray to make sure that they are improving.  You can use the [employer] as directed for cough.  You can use the albuterol inhaler as directed for bronchospasms/wheezing.  Your blood pressure was high here today.  You need to follow-up with your primary care doctor regarding her high blood pressure.  Return the emergency department for any chest pain, difficulty breathing, continued cough, swelling of your legs, vomiting, any other worsening or concerning symptoms.  Your blood pressure was high here today.

## 2017-06-22 ENCOUNTER — Other Ambulatory Visit: Payer: Self-pay | Admitting: Geriatric Medicine

## 2017-06-22 DIAGNOSIS — R0609 Other forms of dyspnea: Principal | ICD-10-CM

## 2017-06-26 ENCOUNTER — Other Ambulatory Visit: Payer: Self-pay

## 2017-06-26 ENCOUNTER — Ambulatory Visit (HOSPITAL_COMMUNITY): Payer: BC Managed Care – PPO | Attending: Cardiovascular Disease

## 2017-06-26 DIAGNOSIS — I340 Nonrheumatic mitral (valve) insufficiency: Secondary | ICD-10-CM | POA: Diagnosis not present

## 2017-06-26 DIAGNOSIS — R0609 Other forms of dyspnea: Secondary | ICD-10-CM

## 2017-06-26 DIAGNOSIS — I119 Hypertensive heart disease without heart failure: Secondary | ICD-10-CM | POA: Insufficient documentation

## 2017-06-26 DIAGNOSIS — Z8673 Personal history of transient ischemic attack (TIA), and cerebral infarction without residual deficits: Secondary | ICD-10-CM | POA: Insufficient documentation

## 2017-06-26 DIAGNOSIS — E119 Type 2 diabetes mellitus without complications: Secondary | ICD-10-CM | POA: Diagnosis not present

## 2017-06-26 DIAGNOSIS — E785 Hyperlipidemia, unspecified: Secondary | ICD-10-CM | POA: Insufficient documentation

## 2017-06-27 ENCOUNTER — Ambulatory Visit: Payer: BC Managed Care – PPO | Admitting: Psychology

## 2017-07-04 ENCOUNTER — Encounter: Payer: Self-pay | Admitting: Neurology

## 2017-07-04 ENCOUNTER — Ambulatory Visit: Payer: BC Managed Care – PPO | Admitting: Neurology

## 2017-07-04 VITALS — BP 158/102 | HR 90 | Wt 168.6 lb

## 2017-07-04 DIAGNOSIS — I255 Ischemic cardiomyopathy: Secondary | ICD-10-CM

## 2017-07-04 DIAGNOSIS — I951 Orthostatic hypotension: Secondary | ICD-10-CM | POA: Diagnosis not present

## 2017-07-04 DIAGNOSIS — I651 Occlusion and stenosis of basilar artery: Secondary | ICD-10-CM

## 2017-07-04 DIAGNOSIS — I6930 Unspecified sequelae of cerebral infarction: Secondary | ICD-10-CM

## 2017-07-04 DIAGNOSIS — I6330 Cerebral infarction due to thrombosis of unspecified cerebral artery: Secondary | ICD-10-CM | POA: Diagnosis not present

## 2017-07-04 NOTE — Progress Notes (Signed)
STROKE NEUROLOGY FOLLOW UP NOTE  NAME: Shelly SaucierJody H Raudenbush DOB: [birth year]  REASON FOR VISIT: stroke follow up HISTORY FROM: husband and pt and chart  Today we had the pleasure of seeing Shelly Mcdaniel in follow-up at our Neurology [facility]. Pt was accompanied by husband.   History Summary Shelly Mcdaniel is a 62 y.o. 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 she had right pontine stroke in 01/2016 and was treated in [facility]. TEE and CUS unremarkable and she was discharged with ASA and zocor. She continued to have left hemiparesis. After admission, stroke work up showed again right pontine and b/l cerebellar peduncle infarcts. 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 EEG generalized slowing and no seizure. LDL 110, A1C 6.7. She was put on DAPT and crestor. She was also found to have orthostatic hypotension. Put on TED hose and BP goal 130-150. She was later discharged to CIR.    05/23/16 follow up - the patient has been doing the same. She is now home. She was initially improving on the left hemiparesis but recently developed continued UTI even with antibiotics. As per son, all her improvement has been disappeared due to UTI. Her BP at home lying about 150-170, sitting 130s and standing would drop to 100s. Today in clinic BP 123/85  07/20/16 follow up - pt has been doing better. She had at least UTI x 2, on cipro and recently referred to urology and pending further work up with CT or ultrasound. Fortunately, no sepsis either time. She continued to work with outpt PT/OT and her left LE much improved on strength, however, LUE still flaccid. Working with PT/OT still has intermittent orthostatic hypotension which sometimes aborted her rehab session. However, as per husband, pt BP at home sitting 130/90, lying 150s and standing lower  than 130 but not remeber exact number. She is on metoprolol 25mg  daily. As per husband, daughter who is anesthesiologist [facility] [place] would like pt to have low dose lisinopril for her cardiac and renal protection. BP today in clinic sitting 146/93.   08/31/16 follow up - pt has one episode of staring spells on 08/24/16 lasting about 5min, no shaking jerking or b/b incontinence at that time. Did not check BP. She was sent to ED where she was back to her baseline. Pt can not remember the event. CT head no bleeding. UA negative and CBC BMP normal except K 3.2. She was not put on AEDs at that time.  She was recently discharged from PT/OT and followed with Dr. Wynn BankerKirsteins who does not think pt needs ongoing botox or ritalin treatment. Still has left hemiplegia. BP today 160/104, however, stated her BP at home or other doctor's office always 120-140s. Currently at home, no symptoms with sitting or standing. Working on transition and walking with quad cane at home currently. Pt decreased baclofen and zoloft dose and pt seems more awake alert today in clinic. DM and glucose better controlled. Glucose today 108.   01/03/17 follow up - pt has been pretty much the same. Came in with wheelchair, but able to use cane to transition herself. Continued follow up with Dr. Doroteo BradfordKirstein and received botox injection. Has finished PT/OT. Had recent "stomach virus" infection and gave her setback on neuro improvement. She also has left flank pain, no  shingles, following with pain management, on lyrica, concerning for lumbar radiculopathy. BP today 126/87. As per husband, pt continues to take ASA and plavix and zocor at home. Repeat CTA head and neck showed distal BA only 50% stenosis now, will continue to monitor.  Interval History During the interval time, patient neurologically stable.  However, patient was found to have weight gain with fluid overload.  Had TEE showed EF 35-40%, significantly down from previous 60-65%.  Low-dose  amlodipine was added.  Patient is going to follow-up with cardiology for further treatment.  BP today 158/102.  As per family, her orthostatic hypotension much improved.  Graduated home PT/OT, expecting cardio, rehab at this time.  REVIEW OF SYSTEMS: Full 14 system review of systems performed and notable only for those listed below and in HPI above, all others are negative:  Constitutional:  Fatigue, unexpected weight change, activity change Cardiovascular: Leg swelling Ear/Nose/Throat: Trouble swallowing Skin:  Eyes:   Respiratory: Wheezing Gastroitestinal: Swollen abdomen, constipation Genitourinary:  Hematology/Lymphatic:   Endocrine:  Musculoskeletal:  Allergy/Immunology:  Neurological:   Psychiatric:  Sleep:  Daytime sleepiness  The following represents the patient's updated allergies and side effects list: Allergies  Allergen Reactions  . Bee Venom Anaphylaxis  . Codeine Itching  . Lipitor [Atorvastatin] Other (See Comments)    Cramping   . Septra [Sulfamethoxazole-Trimethoprim] Other (See Comments)    Mother died from Stevens-Johnson syndrome and sister had anaphylaxis from Septra.Physician told her never to take this medication    The neurologically relevant items on the patient's problem list were reviewed on today's visit.  Neurologic Examination  A problem focused neurological exam (12 or more points of the single system neurologic examination, vital signs counts as 1 point, cranial nerves count for 8 points) was performed.  Blood pressure (!) 158/102, pulse 90, weight 168 lb 9.6 oz (76.5 kg).  General - Well nourished, well developed, in no apparent distress.  Ophthalmologic - Fundi not visualized due to eye movement.  Cardiovascular - Regular rate and rhythm.  Mental Status -  Level of arousal and orientation to time, place and person were intact. Language exam showed mild wording finding difficulty and slow speech, mild dysarthria, but intact naming,  repetition, comprehension.  Cranial Nerves II - XII - II - Visual field intact OU. III, IV, VI - Extraocular movements intact. V - Facial sensation intact bilaterally. VII - left facial droop. VIII - Hearing & vestibular intact bilaterally. X - Palate elevates symmetrically, mild dysarthria. XI - Chin turning & shoulder shrug intact bilaterally. XII - Tongue protrusion intact.  Motor Strength - The patient's strength was 5/5 RUE and RLE, but 0/5 LUE and 4/5 LLE proximal but 2/5 DF and PF with left LE brace. Bulk was normal and fasciculations were absent.   Motor Tone - Muscle tone was assessed at the neck and appendages and was mildly increased LUE.  Reflexes - The patient's reflexes were 1+ in all extremities and she had no pathological reflexes.  Sensory - Light touch, temperature/pinprick were assessed and were symmetrical.    Coordination - The patient had normal movements in the right hand with no ataxia or dysmetria.  Tremor was absent.  Gait and Station - in wheelchair, not tested.    Data reviewed: I personally reviewed the images and agree with the radiology interpretations.  Dg Chest 2 View 04/05/2016 No radiographic evidence of acute cardiopulmonary disease.   Ct Head Wo Contrast 04/05/2016 1. No acute intracranial findings.  2. White matter microvascular  disease not changed.   Mr Brain 57 Contrast Mr Maxine Glenn Head Wo Contrast 04/05/2016 1. Foci of diffusion restriction within the right hemipons and abnormal signal in the middle cerebellar peduncles probably represent areas of acute/early subacute ischemia. No hemorrhage identified.  2. 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  3. Otherwise the circle of Willis is patent with atherosclerotic changes and short segments of mild stenosis in the anterior and posterior circulation without large vessel occlusion or aneurysm.  4. Paranasal sinus disease predominantly in anterior  ethmoid and right frontal sinuses.  5. Stable moderate chronic microvascular ischemic changes of the brain parenchyma.   EEG This is an abnormal EEG due to the presence of mild diffuse generalized slowing with greater intermixed bifrontal slowing. This pattern is nonspecific and consistent with a global encephalopathic process, nonspecific as to etiology. There is no evidence of seizure on the study.  CUS 01/17/2016 - bilateral plaques, but no hemodynamically significant stenosis  TTE - 01/17/16 - mild diastolic dysfunction with borderline left ventricular hypertrophy with a trace amount of mital regurgitation, trace amount of aortic regurgitation, trace amount of tricuspid regurgitation.  MRI brain 01/17/16 - atrophy and chronic micro-vascular ischemic changes. There is restricted diffusion on the right side of the pons consistent with an acute infarct.   MRI 05/13/16 1. Limited motion degraded brain MRI consisting of diffusion and FLAIR sequences. 2. No new abnormality compared to 04/05/2016. 3. Diffusion hyperintensity from the recent right pontine infarct has resolved as expected, but symmetric hyperintensity persists in the middle cerebellar peduncles. Favor acute Wallerian degeneration which can have this pattern with pontine infarct. Toxic/metabolic derangement, usually described with liver disease or hypoglycemia, or hypertensive complication are alternate considerations.  CT head 08/24/16 -  Old right brainstem infarct. No acute intracranial abnormality. Chronic microvascular disease.  CTA head and neck 09/07/16 Stable vascular evaluation compared to previous studies. Right carotid bifurcation region atherosclerosis but no stenosis. 33% stenosis of the left common carotid artery proximal to the bifurcation. No left carotid bifurcation disease. 33% stenoses in both carotid siphon regions. Right vertebral artery terminates in PICA. 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  TTE 06/26/17  - Left ventricle: The cavity size was normal. There was mild   concentric hypertrophy. Systolic function was moderately reduced.   The estimated ejection fraction was in the range of 35% to 40%.   Moderate diffuse hypokinesis with no identifiable regional   variations. Features are consistent with a pseudonormal left   ventricular filling pattern, with concomitant abnormal relaxation   and increased filling pressure (grade 2 diastolic dysfunction). - Mitral valve: Mild focal calcification of the anterior leaflet,   limited to the leaflet margin. There was mild regurgitation. - Left atrium: The atrium was mildly dilated. Impressions: - Compared to 2016, there is marked worsening of left ventricular   systolic function, with a global pattern.   Component     Latest Ref Rng & Units 04/05/2016 04/07/2016  Cholesterol     0 - 200 mg/dL  161  Triglycerides     <150 mg/dL  096  HDL Cholesterol     >40 mg/dL  29 (L)  Total CHOL/HDL Ratio     RATIO  5.7  VLDL     0 - 40 mg/dL  25  LDL (calc)     0 - 99 mg/dL  045 (H)  Hemoglobin W0J     4.8 -  5.6 % 6.7 (H)   Mean Plasma Glucose     mg/dL 161   Ammonia     9 - 35 umol/L 14   Vitamin B12     180 - 914 pg/mL  320  Folate     >5.9 ng/mL  21.7    Assessment: As you may recall, she is a 62 y.o. 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 TTE and CUS in 01/2016 unremarkable, EEG generalized slowing and no seizure. LDL 110, A1C 6.7. She was put on DAPT and crestor. She was also found to have orthostatic hypotension. Put on TED hose and BP goal 130-150. She was later discharged to CIR and then home. She was  initially improving on the left hemiparesis but recently worsened after developed continued UTI even with antibiotics. Still has orthostatic hypotension at home and pt does not like compression socks. Over time, her condition stabilized, working with PT/OT and LLE strength much improved. Has frequent UTI and following with urology. Orthostatic hypotension improved. Not tolerating crestor and now on zocor. Had one episode of "staring spells" on 08/19/16, resolved quickly, no AED now. Repeat CTA head and neck showed distal BA 50%, improved from previous MRA.  However, TTE in 06/2017 showed EF 35-40%, started on low-dose amlodipine.  Pending cardiology appointment on 07/18/17.  Plan:  - continue plavix and zocor for stroke prevention - follow up with cardiology for CHF. Orthostatic vitals much improved, OK for BP meds for CHF treatment. BP goal 130-150 if possible or as long as not BP dependent.  - check BP at home and record - Follow up with your primary care physician for stroke risk factor modification. Recommend maintain blood pressure goal 130-150/70-90 if possible, diabetes with hemoglobin A1c goal below 7.0% and lipids with LDL cholesterol goal below 70 mg/dL. Marland Kitchen  - continue home exercise and possible cardio rehab - once cardiac issue stabilized, will consider to repeat CTA head and neck this spring or summer. - follow up in 6 months with Dr. Pearlean Brownie.   I spent more than 25 minutes of face to face time with the patient. Greater than 50% of time was spent in counseling and coordination of care. We discussed BP management, future neuro follow-up, cardiology appointment and cardiac rehab.   No orders of the defined types were placed in this encounter.   No orders of the defined types were placed in this encounter.   Patient Instructions  - continue plavix and zocor for stroke prevention - follow up with cardiology for CHF. Your orthostatic vitals much improved, OK for BP meds for CHF treatment. BP  goal 130-150 if possible or as long as not BP dependent.  - check BP at home and record - Follow up with your primary care physician for stroke risk factor modification. Recommend maintain blood pressure goal 130-150/70-90 if possible, diabetes with hemoglobin A1c goal below 7.0% and lipids with LDL cholesterol goal below 70 mg/dL. Marland Kitchen  - continue home exercise and possible cardio rehab - follow up in 6 months with Dr. Pearlean Brownie.    Marvel Plan, MD PhD [facility] [address], [address] [place], [place] [address] [phone]

## 2017-07-04 NOTE — Patient Instructions (Addendum)
-   continue plavix and zocor for stroke prevention - follow up with cardiology for CHF. Your orthostatic vitals much improved, OK for BP meds for CHF treatment. BP goal 130-150 if possible or as long as not BP dependent.  - check BP at home and record - Follow up with your primary care physician for stroke risk factor modification. Recommend maintain blood pressure goal 130-150/70-90 if possible, diabetes with hemoglobin A1c goal below 7.0% and lipids with LDL cholesterol goal below 70 mg/dL. Marland Kitchen.  - continue home exercise and possible cardio rehab - follow up in 6 months with Shelly Mcdaniel.

## 2017-07-18 ENCOUNTER — Ambulatory Visit: Payer: BC Managed Care – PPO | Admitting: Adult Health

## 2017-07-18 ENCOUNTER — Encounter: Payer: Self-pay | Admitting: Adult Health

## 2017-07-18 VITALS — BP 138/88 | HR 81 | Ht 68.0 in | Wt 166.4 lb

## 2017-07-18 DIAGNOSIS — I651 Occlusion and stenosis of basilar artery: Secondary | ICD-10-CM

## 2017-07-18 DIAGNOSIS — I255 Ischemic cardiomyopathy: Secondary | ICD-10-CM

## 2017-07-18 DIAGNOSIS — E1140 Type 2 diabetes mellitus with diabetic neuropathy, unspecified: Secondary | ICD-10-CM

## 2017-07-18 DIAGNOSIS — I10 Essential (primary) hypertension: Secondary | ICD-10-CM | POA: Diagnosis not present

## 2017-07-18 DIAGNOSIS — I43 Cardiomyopathy in diseases classified elsewhere: Secondary | ICD-10-CM

## 2017-07-18 MED ORDER — LISINOPRIL 5 MG PO TABS: 5.0000 mg | ORAL_TABLET | Freq: Every day | ORAL | 3 refills | Status: DC

## 2017-07-18 NOTE — Progress Notes (Signed)
Cardiology Office Note   Date:  07/18/2017   ID:  Shelly Mcdaniel, DOB 1956-01-10, MRN [id number]  PCP:  Merlene Laughter, MD  Cardiologist: New-Dr. Tresa Endo, (patient requests) Chief Complaint  Patient presents with  . Congestive Heart Failure    EF of 30% to 35%, new  . Hypertension     History of Present Illness: Shelly Mcdaniel is a 62 y.o. female who presents for posthospitalization cardiology evaluation in the setting of cerebral thrombosis followed by Dr.Xu.  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  The patient has chronic left hemiparesis.  CT scan revealed right pontine and bilateral cerebral peduncle infarcts.    The patient was placed on dual anti-platelet therapy, and statin therapy with Crestor.  Due to orthostatic hypotension, TED hose were ordered with a BP goal of 130-150 mmHg systolic.  She was sent for echocardiogram by Dr. Pete Glatter due to dyspnea. This revealed significant reduction in LVEF of 30%-35% , with Grade II diastolic dysfunction. She has subsequently been started on Lasix, with an approximately 6-7 pound weight loss. Some improvement and lower extremity edema and breathing status.  The patient's husband, [name] is a patient of Dr. Landry Dyke, they are requesting that Dr. Tresa Endo become patient's cardiologist. Of note, the patient's son is a paramedic, and the patient's daughter is an anesthesiologist at [employer] currently practicing in the cardiothoracic surgery division.  Past Medical History:  Diagnosis Date  . Diabetes mellitus without complication (HCC)   . Hyperlipidemia   . Hypertension   . Stroke (HCC)   . UTI (urinary tract infection)     Past Surgical History:  Procedure Laterality Date  . BACK SURGERY    . BREAST SURGERY    . COLONOSCOPY WITH PROPOFOL N/A 12/24/2013   Procedure: COLONOSCOPY WITH PROPOFOL;  Surgeon: Charolett Bumpers, MD;  Location: [facility];  Service: Endoscopy;  Laterality: N/A;      Current Outpatient Medications  Medication Sig Dispense Refill  . acetaminophen (TYLENOL) 500 MG tablet Take 1,000 mg by mouth every 6 (six) hours as needed for moderate pain.    Marland Kitchen albuterol (PROVENTIL HFA;VENTOLIN HFA) 108 (90 Base) MCG/ACT inhaler Inhale 1-2 puffs into the lungs every 6 (six) hours as needed for wheezing or shortness of breath. 1 Inhaler 0  . baclofen (LIORESAL) 10 MG tablet Take 1.5 tablets (15 mg total) by mouth 3 (three) times daily. (Patient taking differently: Take 10 mg by mouth at bedtime. ) 120 tablet 1  . benzonatate (TESSALON) 100 MG capsule Take 1 capsule (100 mg total) by mouth every 8 (eight) hours. 21 capsule 0  . clopidogrel (PLAVIX) 75 MG tablet Take 1 tablet (75 mg total) by mouth daily. 30 tablet 0  . furosemide (LASIX) 20 MG tablet Take 20 mg by mouth daily.    . hydroxypropyl methylcellulose / hypromellose (ISOPTO TEARS / GONIOVISC) 2.5 % ophthalmic solution Place 1 drop into both eyes 3 (three) times daily as needed for dry eyes.    Marland Kitchen ibuprofen (ADVIL,MOTRIN) 200 MG tablet Take 600 mg by mouth every 6 (six) hours as needed for moderate pain.    Marland Kitchen insulin aspart (NOVOLOG) 100 UNIT/ML injection Inject 4 Units into the skin 3 (three) times daily as needed for high blood sugar. Only if B/S is over 275    . LEVEMIR FLEXTOUCH 100 UNIT/ML Pen Inject 25 Units into the skin every evening. (Patient taking differently: Inject 10-22 Units into the skin every evening. ) 15  mL 11  . lidocaine (LIDODERM) 5 % Place 3 patches onto the skin daily. Remove & Discard patch within 12 hours or as directed by MD 60 patch 0  . Melatonin 3 MG TABS Take 3-6 mg by mouth at bedtime as needed. For sleep    . metFORMIN (GLUCOPHAGE-XR) 500 MG 24 hr tablet Take 1,000 mg by mouth every evening.    . ondansetron (ZOFRAN ODT) 8 MG disintegrating tablet 8mg  ODT q4 hours prn nausea 6 tablet 0  . ondansetron (ZOFRAN) 4 MG tablet Take 1 tablet (4 mg total) by mouth every 8 (eight) hours as  needed for nausea or vomiting. 12 tablet 0  . Phenazopyridine HCl (AZO TABS PO) Take 1 tablet by mouth 3 (three) times daily as needed. Bladder pain    . pregabalin (LYRICA) 25 MG capsule Take 50 mg by mouth at bedtime.     Marland Kitchen scopolamine (TRANSDERM-SCOP) 1 MG/3DAYS Place 1 patch (1.5 mg total) onto the skin every 3 (three) days. (Patient taking differently: Place 1 patch onto the skin daily as needed (nausea). ) 10 patch 12  . senna-docusate (SENOKOT-S) 8.6-50 MG tablet Take 1 tablet by mouth at bedtime.    . sertraline (ZOLOFT) 50 MG tablet Take 50 mg by mouth daily.    . simvastatin (ZOCOR) 40 MG tablet Take 40 mg by mouth every evening.    Marland Kitchen lisinopril (PRINIVIL,ZESTRIL) 5 MG tablet Take 1 tablet (5 mg total) by mouth daily. 30 tablet 3   No current facility-administered medications for this visit.     Allergies:   Bee venom; Codeine; Lipitor [atorvastatin]; and Septra [sulfamethoxazole-trimethoprim]    Social History:  The patient  reports that  has never smoked. she has never used smokeless tobacco. She reports that she does not drink alcohol or use drugs.   Family History:  The patient's family history includes CAD in her brother and father; Dementia in her mother; Diabetes in her brother, father, other, and sister; Heart disease in her other; Hyperlipidemia in her other; Hypertension in her other; Kidney failure in her brother; Stroke in her brother, father, mother, and other.    ROS: All other systems are reviewed and negative. Unless otherwise mentioned in H&P    PHYSICAL EXAM: VS:  BP 138/88   Pulse 81   Ht 5\' 8"  (1.727 m)   Wt 166 lb 6.4 oz (75.5 kg)   BMI 25.30 kg/m  , BMI Body mass index is 25.3 kg/m. GEN: Well nourished, well developed, in no acute distress  HEENT: normal  Neck: no JVD, carotid bruits, or masses Cardiac: RRR; distant heart sounds ,no murmurs, rubs, or gallops,no edema  Respiratory:  Clear to auscultation bilaterally, normal work of breathing GI:  soft, nontender, nondistended, + BS MS: no deformity or atrophy  Skin: warm and dry, no rash. Pale Neuro:  Strength and sensation are intact on the right, she does have some left humeral brace is noted She is currently in a wheelchair.  Psych: euthymic mood, full affect   EKG:  Normal sinus rhythm with possible left atrial enlargement, possible anterior infarct, with QRS abnormality are noted anteriorly. Heart rate 81 bpm.  Recent Labs: 01/11/2017: ALT 13; BUN 26; Creatinine, Ser 0.77; Hemoglobin 12.4; Platelets 179; Potassium 3.3; Sodium 140    Lipid Panel    Component Value Date/Time   CHOL 164 04/07/2016 0317   TRIG 125 04/07/2016 0317   HDL 29 (L) 04/07/2016 0317   CHOLHDL 5.7 04/07/2016 0317   VLDL  25 04/07/2016 0317   LDLCALC 110 (H) 04/07/2016 0317      Wt Readings from Last 3 Encounters:  07/18/17 166 lb 6.4 oz (75.5 kg)  07/04/17 168 lb 9.6 oz (76.5 kg)  06/12/17 172 lb (78 kg)      Other studies Reviewed: Echocardiogram: 06/26/2017 Left ventricle: The cavity size was normal. There was mild   concentric hypertrophy. Systolic function was moderately reduced.   The estimated ejection fraction was in the range of 35% to 40%.   Moderate diffuse hypokinesis with no identifiable regional   variations. Features are consistent with a pseudonormal left   ventricular filling pattern, with concomitant abnormal relaxation   and increased filling pressure (grade 2 diastolic dysfunction). - Mitral valve: Mild focal calcification of the anterior leaflet,   limited to the leaflet margin. There was mild regurgitation. - Left atrium: The atrium was mildly dilated.  Impressions:  - Compared to 2016, there is marked worsening of left ventricular   systolic function, with a global pattern.  CT of the head:09/07/2016 CT HEAD FINDINGS  Brain: Chronic ischemic changes affecting the cerebral hemispheric white matter in the pons. No sign of acute infarction, mass  lesion, hemorrhage, hydrocephalus or extra-axial collection.  Vascular: There is atherosclerotic calcification of the major vessels at the base of the brain.  Skull: Negative  Sinuses: Clear  Orbits: Negative  Review of the MIP images confirms the above findings  MRI of Brain 05/13/2016 FINDINGS: Only motion limited diffusion and FLAIR imaging was obtained. Resolved diffusion hyperintensity in the right pons where there was acute infarct seen previously. Bilateral middle cerebellar peduncle DWI hyperintensity persists, at least partially shine through based on ADC map. Although there is extensive posterior circulation stenoses, this symmetric on going appearance is unlikely to reflect symmetric infarct. Acute Wallerian degeneration has been described with this pattern in the setting of pontine infarct. Metabolic derangement, usually liver disease or hypoglycemia can give this appearance. Demyelinating disease can occur bilaterally in this region, but no restricted diffusion elsewhere.  No hydrocephalus or shift.  IMPRESSION: 1. Limited motion degraded brain MRI consisting of diffusion and FLAIR sequences. 2. No new abnormality compared to 04/05/2016. 3. Diffusion hyperintensity from the recent right pontine infarct has resolved as expected, but symmetric hyperintensity persists in the middle cerebellar peduncles. Favor acute Wallerian degeneration which can have this pattern with pontine infarct. Toxic/metabolic derangement, usually described with liver disease or hypoglycemia, or hypertensive complication are alternate considerations.  ASSESSMENT AND PLAN:  1. HFrEF: The patient has had substantial reduction in LV systolic function dropping from 65% to 30-35% since May 2016. As result in significant change, the patient will likely require right and left heart catheterization.   I have spoken with Dr. Nicki Guadalajara, who is on site today, about accepting  this  patient, at the request of her husband and family as Dr. Tresa Endo follows the patient's husband. Dr. Tresa Endo has reviewed the patient's record in length, and was kind enough to come over and visit with the family during this appointment.  Is Dr. Landry Dyke recommendation that the patient had labs drawn to evaluate her current status, kidney function, check for anemia, INR. The patient was also requesting that we add hemoglobin A1c to labs. The patient is due to follow-up with her primary care physician, Dr. Pete Glatter, today. He normally draws labs, and will do this today with results sent our office.  If labs are okay, we may consider going ahead and scheduling catheterization. However Dr. Tresa Endo would  like to see the patient in his clinic prior to proceeding with catheterization. Dr. Tresa EndoKelly would like to perform catheterization.   2. Hypertension: In the setting of reduced EF and diagnosis of diabetes, I'm going to take her off of amlodipine and begin her on lisinopril 5 mg daily with up titration as necessary. We will try and be careful concerning dropping blood pressure setting of pontine infarct in the past. Her neurologist, Dr. Roda ShuttersXu as indicated that he would like to keep her blood pressure above 138 systolic. Currently her blood pressure is 138/88. Depending on her response to medication we may need to titrate. Due to reduced ejection fraction, may consider adding low-dose metoprolol or Coreg, his heart rate is not optimal on this visit. This can be reassessed on follow-up appointment with Dr. Tresa EndoKelly, and instituted at th that time  3. Diabetes: Patient is being followed by primary care for this and is due to have labs this afternoon. Defer to primary care for medication adjustment at their discretion.  4. History of CVA 2. In 2016, 2017: She remains on Plavix and has recently had aspirin discontinued by her neurologist. Most recent MRI of the brain dated 04/05/2016 did not reveal any new areas of infarct. Most  recent CT angiogram of the head with and without contrast revealed chronic ischemic changes affecting the cerebellar hemispheric white matter in the pons. No sign of acute infarction, mass lesion, hemorrhage, hydrocephalus, or extra-axial collection. She was found to have atherosclerotic calcification of the major vessels at the base of the brain.  She will continue with her neurologist,Dr.Xu. For ongoing surveillance.     Current medicines are reviewed at length with the patient today.    Labs/ tests ordered today include: No labs today, the patient is due to see PCP for draw of labs this afternoon. Requesting results   Bettey MareKathryn M. Liborio NixonLawrence DNP, ANP, AACC   07/18/2017 4:22 PM    [facility] [address], [place], [place] [address] Phone: [phone]; Fax: [phone]

## 2017-07-18 NOTE — Patient Instructions (Signed)
Medication Instructions:  STOP AMLODIPINE  START LISINOPRIL 5MG  DAILY  If you need a refill on your cardiac medications before your next appointment, please call your pharmacy.  Labwork: CBC, PT/INR, BMET AND A1C WITH PCP-STONEKING HERE IN OUR OFFICE AT [facility]  Follow-Up: Your physician wants you to follow-up in: SOMETIME NEXT WEEK PER DR KELLY-OK TO ADD ON.  [facility] you [facility] choosing [facility]!!

## 2017-07-20 DIAGNOSIS — Z0271 Encounter for disability determination: Secondary | ICD-10-CM

## 2017-07-26 ENCOUNTER — Encounter: Payer: Self-pay | Admitting: Cardiovascular Disease

## 2017-07-26 ENCOUNTER — Ambulatory Visit: Payer: BC Managed Care – PPO | Admitting: Cardiovascular Disease

## 2017-07-26 VITALS — BP 142/78 | HR 78 | Ht 68.0 in | Wt 167.8 lb

## 2017-07-26 DIAGNOSIS — I651 Occlusion and stenosis of basilar artery: Secondary | ICD-10-CM

## 2017-07-26 DIAGNOSIS — I10 Essential (primary) hypertension: Secondary | ICD-10-CM | POA: Diagnosis not present

## 2017-07-26 DIAGNOSIS — Z01812 Encounter for preprocedural laboratory examination: Secondary | ICD-10-CM | POA: Diagnosis not present

## 2017-07-26 DIAGNOSIS — I43 Cardiomyopathy in diseases classified elsewhere: Secondary | ICD-10-CM | POA: Diagnosis not present

## 2017-07-26 DIAGNOSIS — Z79899 Other long term (current) drug therapy: Secondary | ICD-10-CM | POA: Diagnosis not present

## 2017-07-26 DIAGNOSIS — I5042 Chronic combined systolic (congestive) and diastolic (congestive) heart failure: Secondary | ICD-10-CM | POA: Diagnosis not present

## 2017-07-26 DIAGNOSIS — E7849 Other hyperlipidemia: Secondary | ICD-10-CM

## 2017-07-26 DIAGNOSIS — E1140 Type 2 diabetes mellitus with diabetic neuropathy, unspecified: Secondary | ICD-10-CM

## 2017-07-26 DIAGNOSIS — Z8249 Family history of ischemic heart disease and other diseases of the circulatory system: Secondary | ICD-10-CM

## 2017-07-26 MED ORDER — CARVEDILOL 3.125 MG PO TABS: 3.1250 mg | ORAL_TABLET | Freq: Two times a day (BID) | ORAL | 3 refills | Status: DC

## 2017-07-26 NOTE — Patient Instructions (Signed)
Medication Instructions:  START carvedilol (Coreg) 3.125 mg two times daily  Labwork: Please return for FASTING labs Monday (2/25) or Tuesday (2/26) (CMET, CBC, Lipid, TSH, PT/INR)  Testing/Procedures: Your physician has requested that you have a cardiac catheterization. Cardiac catheterization is used to diagnose and/or treat various heart conditions. Doctors may recommend this procedure for a number of different reasons. The most common reason is to evaluate chest pain. Chest pain can be a symptom of coronary artery disease (CAD), and cardiac catheterization can show whether plaque is narrowing or blocking your heart's arteries. This procedure is also used to evaluate the valves, as well as measure the blood flow and oxygen levels in different parts of your heart. For further information please visit [email or web address]. Please follow instruction sheet, as given.  Follow-Up: After cath with Dr. Tresa EndoKelly  Any Other Special Instructions Will Be Listed Below (If Applicable).     If you need a refill on your cardiac medications before your next appointment, please call your pharmacy.

## 2017-07-26 NOTE — Progress Notes (Signed)
Cardiology Office Note    Date:  07/28/2017   ID:  LORIENE TAUNTON, DOB Aug 11, 1955, MRN [id number]  PCP:  Lajean Manes, MD  Cardiologist:  Shelva Majestic, MD   Chief Complaint  Patient presents with  . Follow-up   Initial evaluation with me after the patient was seen by [name], NP on 07/18/2017.  History of Present Illness:  JANIESHA DIEHL is a 62 y.o. female who presents to the office to establish cardiology care with me and follow-up of her recently diagnosed cardiomyopathy.  Ms. Lore is a wife of Mr. Journei Thomassen who I know through his previous work in cardiac rehabilitation.  Her son is a paramedic, and her daughter is in an anesthesiologist at [employer] in cardiothoracic surgery.  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  She stabilized and a TEE and CUS were unremarkable.  She was discharged with aspirin and simvastatin.   In October 2017, she was readmitted with increased confusion and lethargy for 3 days.  She developed a left hemiparesis and subsequent stroke workup again showed right pontine and bilateral cerebellar but don't go infarcts.  An MRA showed progressive abnormalities.  Ultimately, she was placed on dual antiplatelet therapy and statin therapy with Crestor.  Due to orthostatic hypotension, TED hose were ordered with a blood pressure goal of 8:33 to 825 mm systolically.  She had recently developed symptoms of shortness of breath and vague chest pressure.  She was evaluated on 06/12/2017 and was found to have "fluid in her lungs." She  was referred for an echo Doppler evaluation on 06/26/2017 by Dr. Felipa Eth which showed reduced ejection fraction at 35-40% and grade 2 diastolic dysfunction.  Compared to a 2016 echo.  There was now significant development of systolic dysfunction with global hypokinesis.  She was seen by [name] 1 week  ago for evaluation.   At that time, I was introduced to the patient due to concerns that the patient most likely will need a right and left heart cardiac catheterization.  She subsequently underwent laboratory which revealed a serum creatinine of 0.73.  Hemoglobin A1c was increased at 7.8.  Lipid studies in September 2018 showed an LDL cholesterol at 80 with a total cholesterol 145, triglycerides 125, and HDL 42.  Her shortness of breath has improved with low-dose Lasix.  She is on Plavix 75 mg daily.  Apparently she is on simvastatin 40 mg a not Crestor.  With her LV dysfunction.  She was started on lisinopril 5 mg daily.  She is diabetic on insulin and metformin.  She presents for initial evaluation with me.  Past Medical History:  Diagnosis Date  . Diabetes mellitus without complication ([place])   . Hyperlipidemia   . Hypertension   . Stroke ([place])   . UTI (urinary tract infection)     Past Surgical History:  Procedure Laterality Date  . BACK SURGERY    . BREAST SURGERY    . COLONOSCOPY WITH PROPOFOL N/A 12/24/2013   Procedure: COLONOSCOPY WITH PROPOFOL;  Surgeon: [name], MD;  Location: [facility];  Service: Endoscopy;  Laterality: N/A;    Current Medications: Outpatient Medications Prior to Visit  Medication Sig Dispense Refill  . acetaminophen (TYLENOL) 500 MG tablet Take 1,000 mg by mouth every 6 (six) hours as needed for moderate pain.    Marland Kitchen albuterol (PROVENTIL HFA;VENTOLIN HFA) 108 (90 Base) MCG/ACT  inhaler Inhale 1-2 puffs into the lungs every 6 (six) hours as needed for wheezing or shortness of breath. 1 Inhaler 0  . baclofen (LIORESAL) 10 MG tablet Take 1.5 tablets (15 mg total) by mouth 3 (three) times daily. (Patient taking differently: Take 10 mg by mouth at bedtime. ) 120 tablet 1  . benzonatate (TESSALON) 100 MG capsule Take 1 capsule (100 mg total) by mouth every 8 (eight) hours. 21 capsule 0  . clopidogrel (PLAVIX) 75 MG tablet Take 1 tablet (75 mg total) by mouth  daily. 30 tablet 0  . furosemide (LASIX) 20 MG tablet Take 10 mg by mouth daily.     . hydroxypropyl methylcellulose / hypromellose (ISOPTO TEARS / GONIOVISC) 2.5 % ophthalmic solution Place 1 drop into both eyes 3 (three) times daily as needed for dry eyes.    Marland Kitchen ibuprofen (ADVIL,MOTRIN) 200 MG tablet Take 600 mg by mouth every 6 (six) hours as needed for moderate pain.    Marland Kitchen insulin aspart (NOVOLOG) 100 UNIT/ML injection Inject 4 Units into the skin 3 (three) times daily as needed for high blood sugar. Only if B/S is over 275    . LEVEMIR FLEXTOUCH 100 UNIT/ML Pen Inject 25 Units into the skin every evening. (Patient taking differently: Inject 10-22 Units into the skin every evening. ) 15 mL 11  . lidocaine (LIDODERM) 5 % Place 3 patches onto the skin daily. Remove & Discard patch within 12 hours or as directed by MD 60 patch 0  . lisinopril (PRINIVIL,ZESTRIL) 5 MG tablet Take 1 tablet (5 mg total) by mouth daily. 30 tablet 3  . Melatonin 3 MG TABS Take 3-6 mg by mouth at bedtime as needed. For sleep    . metFORMIN (GLUCOPHAGE-XR) 500 MG 24 hr tablet Take 1,000 mg by mouth every evening.    . ondansetron (ZOFRAN ODT) 8 MG disintegrating tablet 61m ODT q4 hours prn nausea 6 tablet 0  . ondansetron (ZOFRAN) 4 MG tablet Take 1 tablet (4 mg total) by mouth every 8 (eight) hours as needed for nausea or vomiting. 12 tablet 0  . Phenazopyridine HCl (AZO TABS PO) Take 1 tablet by mouth 3 (three) times daily as needed. Bladder pain    . pregabalin (LYRICA) 25 MG capsule Take 50 mg by mouth at bedtime.     .Marland Kitchenscopolamine (TRANSDERM-SCOP) 1 MG/3DAYS Place 1 patch (1.5 mg total) onto the skin every 3 (three) days. (Patient taking differently: Place 1 patch onto the skin daily as needed (nausea). ) 10 patch 12  . senna-docusate (SENOKOT-S) 8.6-50 MG tablet Take 1 tablet by mouth at bedtime.    . sertraline (ZOLOFT) 50 MG tablet Take 50 mg by mouth daily.    . simvastatin (ZOCOR) 40 MG tablet Take 40 mg by mouth  every evening.     No facility-administered medications prior to visit.      Allergies:   Bee venom; Codeine; Lipitor [atorvastatin]; and Septra [sulfamethoxazole-trimethoprim]   Social History   Socioeconomic History  . Marital status: Married    Spouse name: None  . Number of children: None  . Years of education: None  . Highest education level: None  Social Needs  . Financial resource strain: None  . Food insecurity - worry: None  . Food insecurity - inability: None  . Transportation needs - medical: None  . Transportation needs - non-medical: None  Occupational History  . None  Tobacco Use  . Smoking status: Never Smoker  . Smokeless tobacco: Never  Used  Substance and Sexual Activity  . Alcohol use: No    Alcohol/week: 0.0 oz  . Drug use: No  . Sexual activity: None  Other Topics Concern  . None  Social History Narrative  . None     Family History:  The patient's family history includes CAD in her brother and father; Dementia in her mother; Diabetes in her brother, father, other, and sister; Heart disease in her other; Hyperlipidemia in her other; Hypertension in her other; Kidney failure in her brother; Stroke in her brother, father, mother, and other.   ROS General: Negative; No fevers, chills, or night sweats;  HEENT: Negative; No changes in vision or hearing, sinus congestion, difficulty swallowing Pulmonary: Negative; No cough, wheezing, shortness of breath, hemoptysis Cardiovascular: See history of present illness GI: Negative; No nausea, vomiting, diarrhea, or abdominal pain GU: Negative; No dysuria, hematuria, or difficulty voiding Musculoskeletal: Negative; no myalgias, joint pain, or weakness Hematologic/Oncology: Negative; no easy bruising, bleeding Endocrine: Positive for diabetes mellitus Neuro: Positive for initial CVA August 2018 with stroke extension October 2018 associated with orthostatic hypotension.  Neuropathy. Skin: Negative; No rashes or  skin lesions Psychiatric: Negative; No behavioral problems, depression Sleep: Negative; No snoring, daytime sleepiness, hypersomnolence, bruxism, restless legs, hypnogognic hallucinations, no cataplexy Other comprehensive 14 point system review is negative.   PHYSICAL EXAM:   VS:  BP (!) 142/78   Pulse 78   Ht 5' 8"  (1.727 m)   Wt 167 lb 12.8 oz (76.1 kg)   SpO2 98%   BMI 25.51 kg/m     Repeat blood pressure by me was 150/84. Wt Readings from Last 3 Encounters:  07/26/17 167 lb 12.8 oz (76.1 kg)  07/18/17 166 lb 6.4 oz (75.5 kg)  07/04/17 168 lb 9.6 oz (76.5 kg)    General: Alert, oriented, no distress.  Skin: normal turgor, no rashes, warm and dry HEENT: Normocephalic, atraumatic. Pupils equal round and reactive to light; sclera anicteric; extraocular muscles intact; Fundi without hemorrhages or exudates. Nose without nasal septal hypertrophy Mouth/Parynx benign; Mallinpatti scale 2 Neck: No JVD, no carotid bruits; normal carotid upstroke Lungs: clear to ausculatation and percussion; no wheezing or rales Chest wall: without tenderness to palpitation Heart: PMI not displaced, RRR, s1 s2 normal, 1/6 systolic murmur, no diastolic murmur, no rubs, gallops, thrills, or heaves Abdomen: soft, nontender; no hepatosplenomehaly, BS+; abdominal aorta nontender and not dilated by palpation. Back: no CVA tenderness Pulses 2+ Musculoskeletal: full range of motion, normal strength, no joint deformities Extremities: no clubbing cyanosis or edema, Homan's sign negative  Neurologic: Residual left hemiparesis.  She previously had a left foot drop. Psychologic: Normal mood and affect   Studies/Labs Reviewed:   EKG:  EKG is ordered today.  ECG (independently read by me): Normal sinus rhythm at 70 bpm.  Possible left atrial enlargement.  QS complex V1 through V3  Recent Labs: BMP Latest Ref Rng & Units 01/11/2017 08/24/2016 06/29/2016  Glucose 65 - 99 mg/dL 115(H) 125(H) 77  BUN 6 - 20 mg/dL  26(H) 17 15  Creatinine 0.44 - 1.00 mg/dL 0.77 0.62 0.48  Sodium 135 - 145 mmol/L 140 142 137  Potassium 3.5 - 5.1 mmol/L 3.3(L) 3.2(L) 3.6  Chloride 101 - 111 mmol/L 103 105 105  CO2 22 - 32 mmol/L 25 27 24   Calcium 8.9 - 10.3 mg/dL 9.6 9.9 9.4     Hepatic Function Latest Ref Rng & Units 01/11/2017 08/24/2016 06/06/2016  Total Protein 6.5 - 8.1 g/dL 6.8 7.1 6.0(L)  Albumin 3.5 - 5.0 g/dL 3.9 4.1 3.4(L)  AST 15 - 41 U/L 17 16 18   ALT 14 - 54 U/L 13(L) 12(L) 21  Alk Phosphatase 38 - 126 U/L 62 55 46  Total Bilirubin 0.3 - 1.2 mg/dL 0.5 0.2(L) 0.3    CBC Latest Ref Rng & Units 01/11/2017 08/24/2016 06/29/2016  WBC 4.0 - 10.5 K/uL 5.3 5.7 5.1  Hemoglobin 12.0 - 15.0 g/dL 12.4 12.6 11.6(L)  Hematocrit 36.0 - 46.0 % 35.0(L) 37.7 34.0(L)  Platelets 150 - 400 K/uL 179 229 236   Lab Results  Component Value Date   MCV 85.0 01/11/2017   MCV 89.5 08/24/2016   MCV 89.9 06/29/2016   Lab Results  Component Value Date   TSH 1.073 04/05/2016   Lab Results  Component Value Date   HGBA1C 6.7 (H) 04/05/2016     BNP No results found for: BNP  ProBNP No results found for: PROBNP   Lipid Panel     Component Value Date/Time   CHOL 164 04/07/2016 0317   TRIG 125 04/07/2016 0317   HDL 29 (L) 04/07/2016 0317   CHOLHDL 5.7 04/07/2016 0317   VLDL 25 04/07/2016 0317   LDLCALC 110 (H) 04/07/2016 0317     RADIOLOGY: No results found.   Additional studies/ records that were reviewed today include:  I reviewed the patient's prior records, neurologic evaluation and evaluation from last week in addition to laboratory  ASSESSMENT:    1. Cardiomyopathy as manifestation of underlying disease ([place])   2. Essential hypertension   3. Chronic combined systolic and diastolic heart failure ([place])   4. Pre-procedure lab exam   5. Type 2 diabetes mellitus with diabetic neuropathy, without long-term current use of insulin ([place])   6. Basilar artery stenosis   7. Other hyperlipidemia   8.  Medication management   9. Family history of CABG      PLAN:  Ms.Nabers has a significant cardiovascular history notable for hypertension, hyperlipidemia, diabetes mellitus, and strong family history with multiple family members having undergone prior CABG revascularization surgery.  The patient unfortunately suffered an initial stroke in August 2017 in 2 months later had stroke extension resulting in residual left hemiparesis, and involvement of foot drop.  Recently, she developed symptoms suggestive of congestive heart failure and an echo Doppler assessment was found to have systolic and diastolic dysfunction with an EF of 35-40% and grade 2 diastolic impairment.  There was mitral annular calcification with mild MR.  She had mild left atrial dilatation.  There was diffuse hypocontractility.  Presently, she denies recent anginal type symptomatology.  She seems that her shortness of breath has improved with initiation of low-dose furosemide and last week she was started on lisinopril at low-dose 5 mg.  Ultimately, if her LV function stays reduced.  She will be a candidate for transition to [facility].  To further evaluate her decline in LV function, in light of her strong cardiac history particular with family members having multiple strokes and CABG revascularization surgeries, definitive right and left heart cardiac catheterization is recommended.  Her daughter will be on town at the end of February and I will schedule this to be done at [facility] tentatively for from every 28 2019. I have reviewed the risks, indications, and alternatives to cardiac catheterization, possible angioplasty, and stenting with the patient. Risks include but are not limited to bleeding, infection, vascular injury, stroke, myocardial infection, arrhythmia, kidney injury, radiation-related injury in the case of prolonged fluoroscopy use, emergency  cardiac surgery, and death. The patient understands the risks of serious  complication is 1-2 in 0233 with diagnostic cardiac cath and 1-2% or less with angioplasty/stenting.  ,  She will undergo repeat laboratory prior to the planned catheterization.  Further recommendations will be made upon completion of the evaluation.  Medication Adjustments/Labs and Tests Ordered: Current medicines are reviewed at length with the patient today.  Concerns regarding medicines are outlined above.  Medication changes, Labs and Tests ordered today are listed in the Patient Instructions below. Patient Instructions  Medication Instructions:  START carvedilol (Coreg) 3.125 mg two times daily  Labwork: Please return for FASTING labs Monday (2/25) or Tuesday (2/26) (CMET, CBC, Lipid, TSH, PT/INR)  Testing/Procedures: Your physician has requested that you have a cardiac catheterization. Cardiac catheterization is used to diagnose and/or treat various heart conditions. Doctors may recommend this procedure for a number of different reasons. The most common reason is to evaluate chest pain. Chest pain can be a symptom of coronary artery disease (CAD), and cardiac catheterization can show whether plaque is narrowing or blocking your heart's arteries. This procedure is also used to evaluate the valves, as well as measure the blood flow and oxygen levels in different parts of your heart. For further information please visit [email or web address]. Please follow instruction sheet, as given.  Follow-Up: After cath with Dr. Claiborne Billings  Any Other Special Instructions Will Be Listed Below (If Applicable).     If you need a refill on your cardiac medications before your next appointment, please call your pharmacy.      Signed, Shelva Majestic, MD  07/28/2017 5:21 PM    [facility] [address] [place], [place], [place]  [address] Phone: [phone]

## 2017-07-28 ENCOUNTER — Encounter: Payer: Self-pay | Admitting: Cardiovascular Disease

## 2017-08-08 ENCOUNTER — Telehealth: Payer: Self-pay | Admitting: *Deleted

## 2017-08-08 NOTE — Telephone Encounter (Signed)
Pt contacted pre-catheterization scheduled at [facility] for: Thursday February 28,2019 7:30 AM Ver[facility] and [facility] at: 5:30 AM Nothing to eat or drink after midnight the night before the cath. Verified allergies in Epic.  Hold: Furosemide AM of cath Insulin 1/2 usual dose PM before cath. No metformin 08/09/17, 08/10/17 and 48 hours post cath.  Clopidogrel 75 mg -pt normally takes clopidogrel in the evening, she will take it the evening before the cath.  Except hold medications AM meds can be  taken pre-cath with sip of water.  Pt states she is not currently taking aspirin, this had been stopped by Dr Roda ShuttersXu, her neurologist,  pt states Dr Tresa EndoKelly is aware that she is not taking aspirin.       Confirmed patient has responsible person to drive home post procedure and observe patient for 24 hours: yes

## 2017-08-10 ENCOUNTER — Ambulatory Visit (HOSPITAL_COMMUNITY)
Admission: RE | Admit: 2017-08-10 | Discharge: 2017-08-10 | Disposition: A | Discharge status: Home / Self Care | Payer: BC Managed Care – PPO | Source: Ambulatory Visit | Attending: Cardiovascular Disease | Admitting: Cardiovascular Disease

## 2017-08-10 ENCOUNTER — Telehealth: Payer: Self-pay | Admitting: Cardiovascular Disease

## 2017-08-10 ENCOUNTER — Encounter (HOSPITAL_COMMUNITY)
Admission: RE | Disposition: A | Discharge status: Home / Self Care | Payer: Self-pay | Source: Ambulatory Visit | Attending: Cardiovascular Disease

## 2017-08-10 DIAGNOSIS — Z7902 Long term (current) use of antithrombotics/antiplatelets: Secondary | ICD-10-CM | POA: Diagnosis not present

## 2017-08-10 DIAGNOSIS — I651 Occlusion and stenosis of basilar artery: Secondary | ICD-10-CM | POA: Insufficient documentation

## 2017-08-10 DIAGNOSIS — I110 Hypertensive heart disease with heart failure: Secondary | ICD-10-CM | POA: Diagnosis not present

## 2017-08-10 DIAGNOSIS — Z8249 Family history of ischemic heart disease and other diseases of the circulatory system: Secondary | ICD-10-CM | POA: Insufficient documentation

## 2017-08-10 DIAGNOSIS — Z882 Allergy status to sulfonamides status: Secondary | ICD-10-CM | POA: Insufficient documentation

## 2017-08-10 DIAGNOSIS — Z794 Long term (current) use of insulin: Secondary | ICD-10-CM | POA: Insufficient documentation

## 2017-08-10 DIAGNOSIS — Z8673 Personal history of transient ischemic attack (TIA), and cerebral infarction without residual deficits: Secondary | ICD-10-CM | POA: Diagnosis not present

## 2017-08-10 DIAGNOSIS — I272 Pulmonary hypertension, unspecified: Secondary | ICD-10-CM | POA: Diagnosis not present

## 2017-08-10 DIAGNOSIS — I429 Cardiomyopathy, unspecified: Secondary | ICD-10-CM

## 2017-08-10 DIAGNOSIS — E1140 Type 2 diabetes mellitus with diabetic neuropathy, unspecified: Secondary | ICD-10-CM | POA: Insufficient documentation

## 2017-08-10 DIAGNOSIS — Z885 Allergy status to narcotic agent status: Secondary | ICD-10-CM | POA: Insufficient documentation

## 2017-08-10 DIAGNOSIS — I2510 Atherosclerotic heart disease of native coronary artery without angina pectoris: Secondary | ICD-10-CM | POA: Insufficient documentation

## 2017-08-10 DIAGNOSIS — Z888 Allergy status to other drugs, medicaments and biological substances status: Secondary | ICD-10-CM | POA: Diagnosis not present

## 2017-08-10 DIAGNOSIS — Z79899 Other long term (current) drug therapy: Secondary | ICD-10-CM | POA: Insufficient documentation

## 2017-08-10 DIAGNOSIS — I5042 Chronic combined systolic (congestive) and diastolic (congestive) heart failure: Secondary | ICD-10-CM | POA: Diagnosis not present

## 2017-08-10 HISTORY — PX: RIGHT/LEFT HEART CATH AND CORONARY ANGIOGRAPHY: CATH118266

## 2017-08-10 LAB — PROTIME-INR
INR: 1.1 (ref 0.8–1.2)
PROTHROMBIN TIME: 11.1 s (ref 9.1–12.0)

## 2017-08-10 LAB — POCT I-STAT 3, VENOUS BLOOD GAS (G3P V)
ACID-BASE DEFICIT: 2 mmol/L (ref 0.0–2.0)
Acid-base deficit: 2 mmol/L (ref 0.0–2.0)
BICARBONATE: 24.5 mmol/L (ref 20.0–28.0)
Bicarbonate: 24.4 mmol/L (ref 20.0–28.0)
O2 SAT: 47 %
O2 SAT: 49 %
PCO2 VEN: 48.8 mmHg (ref 44.0–60.0)
PO2 VEN: 29 mmHg — AB (ref 32.0–45.0)
PO2 VEN: 29 mmHg — AB (ref 32.0–45.0)
TCO2: 26 mmol/L (ref 22–32)
TCO2: 26 mmol/L (ref 22–32)
pCO2, Ven: 48.9 mmHg (ref 44.0–60.0)
pH, Ven: 7.308 (ref 7.250–7.430)
pH, Ven: 7.308 (ref 7.250–7.430)

## 2017-08-10 LAB — CBC
Hematocrit: 34 % (ref 34.0–46.6)
Hemoglobin: 11.2 g/dL (ref 11.1–15.9)
MCH: 28.4 pg (ref 26.6–33.0)
MCHC: 32.9 g/dL (ref 31.5–35.7)
MCV: 86 fL (ref 79–97)
Platelets: 171 10*3/uL (ref 150–379)
RBC: 3.94 x10E6/uL (ref 3.77–5.28)
RDW: 15 % (ref 12.3–15.4)
WBC: 5 10*3/uL (ref 3.4–10.8)

## 2017-08-10 LAB — COMPREHENSIVE METABOLIC PANEL
A/G RATIO: 1.4 (ref 1.2–2.2)
ALBUMIN: 3.9 g/dL (ref 3.6–4.8)
ALT: 10 IU/L (ref 0–32)
AST: 10 IU/L (ref 0–40)
Alkaline Phosphatase: 108 IU/L (ref 39–117)
BILIRUBIN TOTAL: 0.3 mg/dL (ref 0.0–1.2)
BUN / CREAT RATIO: 24 (ref 12–28)
BUN: 16 mg/dL (ref 8–27)
CHLORIDE: 106 mmol/L (ref 96–106)
CO2: 25 mmol/L (ref 20–29)
Calcium: 9.2 mg/dL (ref 8.7–10.3)
Creatinine, Ser: 0.67 mg/dL (ref 0.57–1.00)
GFR calc non Af Amer: 95 mL/min/{1.73_m2} (ref >59)
GFR, EST AFRICAN AMERICAN: 110 mL/min/{1.73_m2} (ref >59)
GLOBULIN, TOTAL: 2.8 g/dL (ref 1.5–4.5)
Glucose: 138 mg/dL — ABNORMAL HIGH (ref 65–99)
Potassium: 4 mmol/L (ref 3.5–5.2)
SODIUM: 143 mmol/L (ref 134–144)
TOTAL PROTEIN: 6.7 g/dL (ref 6.0–8.5)

## 2017-08-10 LAB — POCT I-STAT 3, ART BLOOD GAS (G3+)
Acid-base deficit: 3 mmol/L — ABNORMAL HIGH (ref 0.0–2.0)
Bicarbonate: 23.2 mmol/L (ref 20.0–28.0)
O2 SAT: 95 %
TCO2: 25 mmol/L (ref 22–32)
pCO2 arterial: 45.3 mmHg (ref 32.0–48.0)
pH, Arterial: 7.317 — ABNORMAL LOW (ref 7.350–7.450)
pO2, Arterial: 80 mmHg — ABNORMAL LOW (ref 83.0–108.0)

## 2017-08-10 LAB — LIPID PANEL
CHOL/HDL RATIO: 4 ratio (ref 0.0–4.4)
Cholesterol, Total: 133 mg/dL (ref 100–199)
HDL: 33 mg/dL — ABNORMAL LOW (ref >39)
LDL Calculated: 76 mg/dL (ref 0–99)
TRIGLYCERIDES: 121 mg/dL (ref 0–149)
VLDL Cholesterol Cal: 24 mg/dL (ref 5–40)

## 2017-08-10 LAB — TSH: TSH: 2.56 u[IU]/mL (ref 0.450–4.500)

## 2017-08-10 LAB — GLUCOSE, CAPILLARY
Glucose-Capillary: 269 mg/dL — ABNORMAL HIGH (ref 65–99)
Glucose-Capillary: 259 mg/dL — ABNORMAL HIGH (ref 65–99)

## 2017-08-10 SURGERY — RIGHT/LEFT HEART CATH AND CORONARY ANGIOGRAPHY
Anesthesia: LOCAL

## 2017-08-10 MED ORDER — SODIUM CHLORIDE 0.9% FLUSH: 3.0000 mL | Freq: Two times a day (BID) | INTRAVENOUS | Status: DC

## 2017-08-10 MED ORDER — IOPAMIDOL (ISOVUE-370) INJECTION 76%
INTRAVENOUS | Status: AC
  Filled 2017-08-10: qty 100

## 2017-08-10 MED ORDER — ONDANSETRON HCL 4 MG/2ML IJ SOLN
INTRAMUSCULAR | Status: AC
  Administered 2017-08-10: 4 mg via INTRAVENOUS
  Filled 2017-08-10: qty 2

## 2017-08-10 MED ORDER — LIDOCAINE HCL (PF) 1 % IJ SOLN
INTRAMUSCULAR | Status: AC
  Filled 2017-08-10: qty 30

## 2017-08-10 MED ORDER — MIDAZOLAM HCL 2 MG/2ML IJ SOLN
INTRAMUSCULAR | Status: AC
Start: 2017-08-10 — End: ?
  Filled 2017-08-10: qty 2

## 2017-08-10 MED ORDER — ROSUVASTATIN CALCIUM 20 MG PO TABS: 20.0000 mg | ORAL_TABLET | Freq: Every day | ORAL | Status: DC

## 2017-08-10 MED ORDER — CLOPIDOGREL BISULFATE 75 MG PO TABS
ORAL_TABLET | ORAL | Status: AC
  Administered 2017-08-10: 75 mg via ORAL
  Filled 2017-08-10: qty 1

## 2017-08-10 MED ORDER — SODIUM CHLORIDE 0.9 % IV SOLN: INTRAVENOUS | Status: DC

## 2017-08-10 MED ORDER — ACETAMINOPHEN 325 MG PO TABS: 650.0000 mg | ORAL_TABLET | ORAL | Status: DC | PRN

## 2017-08-10 MED ORDER — FENTANYL CITRATE (PF) 100 MCG/2ML IJ SOLN
INTRAMUSCULAR | Status: DC | PRN
  Administered 2017-08-10: 25 ug via INTRAVENOUS

## 2017-08-10 MED ORDER — ASPIRIN 81 MG PO CHEW
CHEWABLE_TABLET | ORAL | Status: AC
  Administered 2017-08-10: 81 mg via ORAL
  Filled 2017-08-10: qty 1

## 2017-08-10 MED ORDER — CARVEDILOL 3.125 MG PO TABS: 6.2500 mg | ORAL_TABLET | Freq: Two times a day (BID) | ORAL | 3 refills | Status: DC

## 2017-08-10 MED ORDER — SODIUM CHLORIDE 0.9 % IV SOLN: 250.0000 mL | INTRAVENOUS | Status: DC | PRN

## 2017-08-10 MED ORDER — SODIUM CHLORIDE 0.9 % WEIGHT BASED INFUSION: 1.0000 mL/kg/h | INTRAVENOUS | Status: DC

## 2017-08-10 MED ORDER — HYDRALAZINE HCL 20 MG/ML IJ SOLN
INTRAMUSCULAR | Status: DC | PRN
  Administered 2017-08-10: 20 mg via INTRAVENOUS

## 2017-08-10 MED ORDER — ROSUVASTATIN CALCIUM 20 MG PO TABS: 20.0000 mg | ORAL_TABLET | Freq: Every day | ORAL | 2 refills | Status: DC

## 2017-08-10 MED ORDER — FENTANYL CITRATE (PF) 100 MCG/2ML IJ SOLN
INTRAMUSCULAR | Status: AC
  Filled 2017-08-10: qty 2

## 2017-08-10 MED ORDER — MIDAZOLAM HCL 2 MG/2ML IJ SOLN
INTRAMUSCULAR | Status: DC | PRN
  Administered 2017-08-10 (×2): 1 mg via INTRAVENOUS

## 2017-08-10 MED ORDER — CLOPIDOGREL BISULFATE 75 MG PO TABS
75.0000 mg | ORAL_TABLET | Freq: Once | ORAL | Status: AC
  Administered 2017-08-10: 75 mg via ORAL

## 2017-08-10 MED ORDER — HEPARIN (PORCINE) IN NACL 2-0.9 UNIT/ML-% IJ SOLN
INTRAMUSCULAR | Status: AC | PRN
  Administered 2017-08-10 (×2): 500 mL

## 2017-08-10 MED ORDER — HYDRALAZINE HCL 20 MG/ML IJ SOLN
INTRAMUSCULAR | Status: AC
  Filled 2017-08-10: qty 1

## 2017-08-10 MED ORDER — IOPAMIDOL (ISOVUE-370) INJECTION 76%
INTRAVENOUS | Status: DC | PRN
  Administered 2017-08-10: 80 mL via INTRAVENOUS

## 2017-08-10 MED ORDER — ASPIRIN 81 MG PO CHEW
81.0000 mg | CHEWABLE_TABLET | ORAL | Status: AC
  Administered 2017-08-10: 81 mg via ORAL

## 2017-08-10 MED ORDER — FENTANYL CITRATE (PF) 100 MCG/2ML IJ SOLN
25.0000 ug | Freq: Once | INTRAMUSCULAR | Status: AC
  Administered 2017-08-10: 25 ug via INTRAVENOUS

## 2017-08-10 MED ORDER — SODIUM CHLORIDE 0.9 % WEIGHT BASED INFUSION
3.0000 mL/kg/h | INTRAVENOUS | Status: AC
  Administered 2017-08-10: 3 mL/kg/h via INTRAVENOUS

## 2017-08-10 MED ORDER — SODIUM CHLORIDE 0.9% FLUSH: 3.0000 mL | INTRAVENOUS | Status: DC | PRN

## 2017-08-10 MED ORDER — ONDANSETRON HCL 4 MG/2ML IJ SOLN
4.0000 mg | Freq: Four times a day (QID) | INTRAMUSCULAR | Status: DC | PRN
  Administered 2017-08-10: 4 mg via INTRAVENOUS

## 2017-08-10 MED ORDER — LIDOCAINE HCL (PF) 1 % IJ SOLN
INTRAMUSCULAR | Status: DC | PRN
  Administered 2017-08-10: 17 mL

## 2017-08-10 MED ORDER — ONDANSETRON HCL 4 MG/2ML IJ SOLN: 4.0000 mg | Freq: Four times a day (QID) | INTRAMUSCULAR | Status: DC | PRN

## 2017-08-10 MED ORDER — CARVEDILOL 6.25 MG PO TABS: 6.2500 mg | ORAL_TABLET | Freq: Two times a day (BID) | ORAL | Status: DC

## 2017-08-10 MED ORDER — HEPARIN (PORCINE) IN NACL 2-0.9 UNIT/ML-% IJ SOLN
INTRAMUSCULAR | Status: AC
Start: 2017-08-10 — End: ?
  Filled 2017-08-10: qty 1000

## 2017-08-10 SURGICAL SUPPLY — 12 items
CATH INFINITI 5FR ANG PIGTAIL (CATHETERS) ×1 IMPLANT
CATH INFINITI MULTIPACK ST 5F (CATHETERS) ×1 IMPLANT
CATH SWAN GANZ 7F STRAIGHT (CATHETERS) ×1 IMPLANT
KIT HEART LEFT (KITS) ×2 IMPLANT
PACK CARDIAC CATHETERIZATION (CUSTOM PROCEDURE TRAY) ×2 IMPLANT
SHEATH PINNACLE 5F 10CM (SHEATH) ×1 IMPLANT
SHEATH PINNACLE 7F 10CM (SHEATH) ×1 IMPLANT
SYR MEDRAD MARK V 150ML (SYRINGE) ×2 IMPLANT
TRANSDUCER W/STOPCOCK (MISCELLANEOUS) ×3 IMPLANT
TUBING CIL FLEX 10 FLL-RA (TUBING) ×2 IMPLANT
WIRE EMERALD 3MM-J .025X260CM (WIRE) ×1 IMPLANT
WIRE EMERALD 3MM-J .035X150CM (WIRE) ×1 IMPLANT

## 2017-08-10 NOTE — Progress Notes (Signed)
Site area: rt groin fa and fv sheaths  Site Prior to Removal:  Level 0 Pressure Applied For: 40 minutes Manual:   yes Patient Status During Pull:  stable Post Pull Site:  Level  0 Post Pull Instructions Given:  yes Post Pull Pulses Present: palpable Dressing Applied:  Gauze and tegaderm Bedrest begins @ 0940 Comments:

## 2017-08-10 NOTE — Progress Notes (Signed)
Pt site started oozing, pressure held 30 minutes. VSS. Level 0. After 10 minutes, site started oozing again. PA paged to come see patient.

## 2017-08-10 NOTE — Progress Notes (Signed)
Mardella LaymanLindsey in to see pt.  Right groin site has stopped bleeding. New dressing applied. Will continue to monitor.

## 2017-08-10 NOTE — Telephone Encounter (Signed)
Received notification that MD would like patient started on entresto and suggested she stop by our office to pick up samples. Samples, co-pay card, free 30 day card left for patient to pick up   Medication samples have been provided to the patient.  Drug name: entresto 24-26mg   Qty: 1 box  LOT: [id number]  Exp.Date: June 2021  Samples left at front desk for patient pick-up. Patient notified.  Lindell Sparlkins, Jenna M 9:21 AM 08/10/2017

## 2017-08-10 NOTE — Discharge Instructions (Signed)

## 2017-08-11 ENCOUNTER — Encounter (HOSPITAL_COMMUNITY): Payer: Self-pay | Admitting: Cardiovascular Disease

## 2017-08-11 MED FILL — Heparin Sodium (Porcine) 2 Unit/ML in Sodium Chloride 0.9%: INTRAMUSCULAR | Qty: 1000 | Status: AC

## 2017-08-24 ENCOUNTER — Encounter: Payer: Self-pay | Admitting: Cardiovascular Disease

## 2017-08-24 ENCOUNTER — Ambulatory Visit: Payer: BC Managed Care – PPO | Admitting: Cardiovascular Disease

## 2017-08-24 VITALS — BP 136/86 | HR 75 | Wt 171.0 lb

## 2017-08-24 DIAGNOSIS — E1140 Type 2 diabetes mellitus with diabetic neuropathy, unspecified: Secondary | ICD-10-CM | POA: Diagnosis not present

## 2017-08-24 DIAGNOSIS — E7849 Other hyperlipidemia: Secondary | ICD-10-CM | POA: Diagnosis not present

## 2017-08-24 DIAGNOSIS — I10 Essential (primary) hypertension: Secondary | ICD-10-CM | POA: Diagnosis not present

## 2017-08-24 DIAGNOSIS — I5042 Chronic combined systolic (congestive) and diastolic (congestive) heart failure: Secondary | ICD-10-CM | POA: Diagnosis not present

## 2017-08-24 DIAGNOSIS — Z79899 Other long term (current) drug therapy: Secondary | ICD-10-CM

## 2017-08-24 DIAGNOSIS — I43 Cardiomyopathy in diseases classified elsewhere: Secondary | ICD-10-CM

## 2017-08-24 DIAGNOSIS — I2510 Atherosclerotic heart disease of native coronary artery without angina pectoris: Secondary | ICD-10-CM

## 2017-08-24 MED ORDER — SACUBITRIL-VALSARTAN 49-51 MG PO TABS: 1.0000 | ORAL_TABLET | Freq: Two times a day (BID) | ORAL | 3 refills | Status: DC

## 2017-08-24 MED ORDER — EZETIMIBE 10 MG PO TABS: 10.0000 mg | ORAL_TABLET | Freq: Every day | ORAL | 3 refills | Status: DC

## 2017-08-24 MED ORDER — SIMVASTATIN 40 MG PO TABS: 40.0000 mg | ORAL_TABLET | Freq: Every day | ORAL | 3 refills | Status: DC

## 2017-08-24 NOTE — Patient Instructions (Signed)
Medication Instructions:  INCREASE Entresto to 49/51 mg two times daily  STOP Crestor  RESTART simvastatin 40 mg daily-1st dose Tuesday  START Zetia 10 mg daily  Labwork: BMET, ProBNP, CBC-today  Follow-Up: 6-8 weeks with Dr. Tresa EndoKelly  Any Other Special Instructions Will Be Listed Below (If Applicable).     If you need a refill on your cardiac medications before your next appointment, please call your pharmacy.

## 2017-08-24 NOTE — Progress Notes (Signed)
Cardiology Office Note    Date:  08/25/2017   ID:  Shelly Mcdaniel, DOB 14-Jul-1955, MRN [id number]  PCP:  Shelly Manes, MD  Cardiologist:  Shelly Majestic, MD    History of Present Illness:  Shelly Mcdaniel is a 62 y.o. female who presents to the office follow-up of her recent cardiac catheterization.  Shelly Mcdaniel is a wife of Mr. Shelly Mcdaniel who I know through his previous work in cardiac rehabilitation.  Her son is a paramedic, and her daughter is in an anesthesiologist at [employer] in cardiothoracic surgery.  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  She stabilized and a TEE and CUS were unremarkable.  She was discharged with aspirin and simvastatin.   In October 2017, she was readmitted with increased confusion and lethargy for 3 days.  She developed a left hemiparesis and subsequent stroke workup again showed right pontine and bilateral cerebellar but don't go infarcts.  An MRA showed progressive abnormalities.  Ultimately, she was placed on dual antiplatelet therapy and statin therapy with Crestor.  Due to orthostatic hypotension, TED hose were ordered with a blood pressure goal of 7:65 to 465 mm systolically.  She had recently developed symptoms of shortness of breath and vague chest pressure.  She was evaluated on 06/12/2017 and was found to have "fluid in her lungs." She  was referred for an echo Doppler evaluation on 06/26/2017 by Dr. Felipa Mcdaniel which showed reduced ejection fraction at 35-40% and grade 2 diastolic dysfunction.  Compared to a 2016 echo.  There was now significant development of systolic dysfunction with global hypokinesis.  She was initially seen by Shelly Mcdaniel. At that time, I was introduced to the patient due to concerns that the patient most likely will need a right and left heart cardiac catheterization.  She subsequently underwent laboratory  which revealed a serum creatinine of 0.73.  Hemoglobin A1c was increased at 7.8.  Lipid studies in September 2018 showed an LDL cholesterol at 80 with a total cholesterol 145, triglycerides 125, and HDL 42.  Her shortness of breath has improved with low-dose Lasix.  She is on Plavix 75 mg daily.  Apparently she is on simvastatin 40 mg a not Crestor.  With her LV dysfunction.  She was started on lisinopril 5 mg daily.  She is diabetic on insulin and metformin.    I saw her for follow-up evaluation on July 26, 2017 at which time she was feeling better with furosemide and low-dose lisinopril.  She underwent right and left heart cardiac catheterization on August 10, 2017 by me which showed severe cardiomyopathy with diffuse global hypocontractility and an EF of 20-25%.  There was mild multivessel CAD with 40% proximal diagonal stenosis, 40% LAD stenosis after the diagonal, 40% circumflex stenosis, and 50-60% mid RCA stenoses with 85% focal stenosis in a small caliber RV marginal branch.  She has significant right heart pressure elevation with pulmonary hypertension with a mean PA pressure at 48-50 mm.  Allowing her catheterization, I recommended discontinuing lisinopril and 36 hours later she was started on samples of Entresto 24/26 mg twice daily.  I also increase carvedilol to 6.25 mg twice daily.  Over the past several weeks she has felt well.  Her blood pressures at home typically are in the 035 range systolically.  She presents for evaluation.  Past Medical History:  Diagnosis Date  . Diabetes mellitus without  complication ([place])   . Hyperlipidemia   . Hypertension   . Stroke ([place])   . UTI (urinary tract infection)     Past Surgical History:  Procedure Laterality Date  . BACK SURGERY    . BREAST SURGERY    . COLONOSCOPY WITH PROPOFOL N/A 12/24/2013   Procedure: COLONOSCOPY WITH PROPOFOL;  Surgeon: Shelly Fair, MD;  Location: [facility];  Service: Endoscopy;  Laterality: N/A;  .  RIGHT/LEFT HEART CATH AND CORONARY ANGIOGRAPHY N/A 08/10/2017   Procedure: RIGHT/LEFT HEART CATH AND CORONARY ANGIOGRAPHY;  Surgeon: Shelly Sine, MD;  Location: [facility];  Service: Cardiovascular;  Laterality: N/A;    Current Medications: Outpatient Medications Prior to Visit  Medication Sig Dispense Refill  . acetaminophen (TYLENOL) 500 MG tablet Take 1,000 mg by mouth every 6 (six) hours as needed for moderate pain.    Marland Kitchen albuterol (PROVENTIL HFA;VENTOLIN HFA) 108 (90 Base) MCG/ACT inhaler Inhale 1-2 puffs into the lungs every 6 (six) hours as needed for wheezing or shortness of breath. 1 Inhaler 0  . baclofen (LIORESAL) 10 MG tablet Take 1.5 tablets (15 mg total) by mouth 3 (three) times daily. (Patient taking differently: Take 10 mg by mouth at bedtime. ) 120 tablet 1  . benzonatate (TESSALON) 100 MG capsule Take 1 capsule (100 mg total) by mouth every 8 (eight) hours. (Patient taking differently: Take 100 mg by mouth 3 (three) times daily as needed for cough. ) 21 capsule 0  . carvedilol (COREG) 3.125 MG tablet Take 2 tablets (6.25 mg total) by mouth 2 (two) times daily. 180 tablet 3  . clopidogrel (PLAVIX) 75 MG tablet Take 1 tablet (75 mg total) by mouth daily. (Patient taking differently: Take 75 mg by mouth at bedtime. ) 30 tablet 0  . furosemide (LASIX) 20 MG tablet Take 20 mg by mouth daily.     . hydroxypropyl methylcellulose / hypromellose (ISOPTO TEARS / GONIOVISC) 2.5 % ophthalmic solution Place 1 drop into both eyes 3 (three) times daily as needed for dry eyes.    Marland Kitchen ibuprofen (ADVIL,MOTRIN) 200 MG tablet Take 600 mg by mouth every 6 (six) hours as needed for moderate pain.    Marland Kitchen insulin aspart (NOVOLOG) 100 UNIT/ML injection Inject 4 Units into the skin 2 (two) times daily as needed for high blood sugar. Only if B/S is over 275    . LEVEMIR FLEXTOUCH 100 UNIT/ML Pen Inject 25 Units into the skin every evening. (Patient taking differently: Inject 22 Units into the skin  every evening. ) 15 mL 11  . lidocaine (LIDODERM) 5 % Place 3 patches onto the skin daily. Remove & Discard patch within 12 hours or as directed by MD (Patient taking differently: Place 1 patch onto the skin daily as needed. Remove & Discard patch within 12 hours or as directed by MD) 60 patch 0  . Melatonin 5 MG TABS Take 5 mg by mouth at bedtime.    . metFORMIN (GLUCOPHAGE-XR) 500 MG 24 hr tablet Take 1,000 mg by mouth every evening.    . ondansetron (ZOFRAN) 4 MG tablet Take 1 tablet (4 mg total) by mouth every 8 (eight) hours as needed for nausea or vomiting. (Patient taking differently: Take 4 mg by mouth at bedtime. ) 12 tablet 0  . Phenazopyridine HCl (AZO TABS PO) Take 1 tablet by mouth 3 (three) times daily as needed. Bladder pain    . pregabalin (LYRICA) 25 MG capsule Take 50 mg by mouth at bedtime.     Marland Kitchen  scopolamine (TRANSDERM-SCOP) 1 MG/3DAYS Place 1 patch (1.5 mg total) onto the skin every 3 (three) days. 10 patch 12  . senna-docusate (SENOKOT-S) 8.6-50 MG tablet Take 1 tablet by mouth at bedtime.    . sertraline (ZOLOFT) 50 MG tablet Take 50 mg by mouth at bedtime.     . rosuvastatin (CRESTOR) 20 MG tablet Take 1 tablet (20 mg total) by mouth daily at 6 PM. 30 tablet 2   No facility-administered medications prior to visit.      Allergies:   Bee venom; Codeine; Lipitor [atorvastatin]; and Septra [sulfamethoxazole-trimethoprim]   Social History   Socioeconomic History  . Marital status: Married    Spouse name: None  . Number of children: None  . Years of education: None  . Highest education level: None  Social Needs  . Financial resource strain: None  . Food insecurity - worry: None  . Food insecurity - inability: None  . Transportation needs - medical: None  . Transportation needs - non-medical: None  Occupational History  . None  Tobacco Use  . Smoking status: Never Smoker  . Smokeless tobacco: Never Used  Substance and Sexual Activity  . Alcohol use: No     Alcohol/week: 0.0 oz  . Drug use: No  . Sexual activity: None  Other Topics Concern  . None  Social History Narrative  . None     Family History:  The patient's family history includes CAD in her brother and father; Dementia in her mother; Diabetes in her brother, father, other, and sister; Heart disease in her other; Hyperlipidemia in her other; Hypertension in her other; Kidney failure in her brother; Stroke in her brother, father, mother, and other.   ROS General: Negative; No fevers, chills, or night sweats;  HEENT: Negative; No changes in vision or hearing, sinus congestion, difficulty swallowing Pulmonary: Negative; No cough, wheezing, shortness of breath, hemoptysis Cardiovascular: See history of present illness GI: Negative; No nausea, vomiting, diarrhea, or abdominal pain GU: Negative; No dysuria, hematuria, or difficulty voiding Musculoskeletal: Negative; no myalgias, joint pain, or weakness Hematologic/Oncology: Negative; no easy bruising, bleeding Endocrine: Positive for diabetes mellitus Neuro: Positive for initial CVA August 2018 with stroke extension October 2018 associated with orthostatic hypotension.  Neuropathy. Skin: Negative; No rashes or skin lesions Psychiatric: Negative; No behavioral problems, depression Sleep: Negative; No snoring, daytime sleepiness, hypersomnolence, bruxism, restless legs, hypnogognic hallucinations, no cataplexy Other comprehensive 14 point system review is negative.   PHYSICAL EXAM:   VS:  BP 136/86   Pulse 75   Wt 171 lb (77.6 kg)   BMI 25.62 kg/m     Repeat blood pressure by me was 132/84  Wt Readings from Last 3 Encounters:  08/24/17 171 lb (77.6 kg)  08/10/17 161 lb (73 kg)  07/26/17 167 lb 12.8 oz (76.1 kg)    General: Alert, oriented, no distress.  Skin: normal turgor, no rashes, warm and dry HEENT: Normocephalic, atraumatic. Pupils equal round and reactive to light; sclera anicteric; extraocular muscles intact;  Nose  without nasal septal hypertrophy Mouth/Parynx benign; Mallinpatti scale 2 Neck: No JVD, no carotid bruits; normal carotid upstroke Lungs: clear to ausculatation and percussion; no wheezing or rales Chest wall: without tenderness to palpitation Heart: PMI not displaced, RRR, s1 s2 normal, 1/6 systolic murmur, no diastolic murmur, no rubs, gallops, thrills, or heaves Abdomen: soft, nontender; no hepatosplenomehaly, BS+; abdominal aorta nontender and not dilated by palpation. Back: no CVA tenderness Pulses 2+ R groin site stable Musculoskeletal: full range of  motion, normal strength, no joint deformities Extremities: no clubbing cyanosis or edema, Homan's sign negative  Neurologic: Residual left hemiparesis and left foot drop Psychologic: Normal mood and affect   Studies/Labs Reviewed:   EKG:  EKG is ordered today. ECG (independently read by me): normal sinus rhythm at 75 bpm.  Nonspecific ST T changes. QT interval 446; QTc interval 498 ms.   07/26/2017 ECG (independently read by me): Normal sinus rhythm at 70 bpm.  Possible left atrial enlargement.  QS complex V1 through V3  Recent Labs: BMP Latest Ref Rng & Units 08/24/2017 08/09/2017 01/11/2017  Glucose 65 - 99 mg/dL 347(H) 138(H) 115(H)  BUN 8 - 27 mg/dL 25 16 26(H)  Creatinine 0.57 - 1.00 mg/dL 0.87 0.67 0.77  BUN/Creat Ratio 12 - 28 29(H) 24 -  Sodium 134 - 144 mmol/L 140 143 140  Potassium 3.5 - 5.2 mmol/L 4.3 4.0 3.3(L)  Chloride 96 - 106 mmol/L 102 106 103  CO2 20 - 29 mmol/L 25 25 25   Calcium 8.7 - 10.3 mg/dL 9.0 9.2 9.6     Hepatic Function Latest Ref Rng & Units 08/09/2017 01/11/2017 08/24/2016  Total Protein 6.0 - 8.5 g/dL 6.7 6.8 7.1  Albumin 3.6 - 4.8 g/dL 3.9 3.9 4.1  AST 0 - 40 IU/L 10 17 16   ALT 0 - 32 IU/L 10 13(L) 12(L)  Alk Phosphatase 39 - 117 IU/L 108 62 55  Total Bilirubin 0.0 - 1.2 mg/dL 0.3 0.5 0.2(L)    CBC Latest Ref Rng & Units 08/24/2017 08/09/2017 01/11/2017  WBC 3.4 - 10.8 x10E3/uL 5.3 5.0 5.3  Hemoglobin  11.1 - 15.9 g/dL 12.7 11.2 12.4  Hematocrit 34.0 - 46.6 % 39.2 34.0 35.0(L)  Platelets 150 - 379 x10E3/uL 202 171 179   Lab Results  Component Value Date   MCV 88 08/24/2017   MCV 86 08/09/2017   MCV 85.0 01/11/2017   Lab Results  Component Value Date   TSH 2.560 08/09/2017   Lab Results  Component Value Date   HGBA1C 6.7 (H) 04/05/2016     BNP No results found for: BNP  ProBNP    Component Value Date/Time   PROBNP 1,700 (H) 08/24/2017 1543     Lipid Panel     Component Value Date/Time   CHOL 133 08/09/2017 0856   TRIG 121 08/09/2017 0856   HDL 33 (L) 08/09/2017 0856   CHOLHDL 4.0 08/09/2017 0856   CHOLHDL 5.7 04/07/2016 0317   VLDL 25 04/07/2016 0317   LDLCALC 76 08/09/2017 0856     RADIOLOGY: No results found.   Additional studies/ records that were reviewed today include:  I reviewed the patient's prior records, neurologic evaluation and evaluation from last week in addition to laboratory; cardiac catheterization findings  ASSESSMENT:    1. Cardiomyopathy as manifestation of underlying disease ([place])   2. Chronic combined systolic and diastolic heart failure ([place])   3. Essential hypertension   4. Type 2 diabetes mellitus with diabetic neuropathy, without long-term current use of insulin ([place])   5. Coronary artery disease involving native coronary artery of native heart without angina pectoris   6. Other hyperlipidemia   7. Medication management      PLAN:  ShellyLeyda has a significant cardiovascular history notable for hypertension, hyperlipidemia, diabetes mellitus, and strong family history with multiple family members having undergone prior CABG revascularization surgery.  The patient unfortunately suffered an initial stroke in August 2017 in 2 months later had stroke extension resulting in residual left hemiparesis,  and involvement of foot drop.  Recently, she developed symptoms suggestive of congestive heart failure and an echo Doppler assessment  was found to have systolic and diastolic dysfunction with an EF of 35-40% and grade 2 diastolic impairment.  There was mitral annular calcification with mild MR.  She had mild left atrial dilatation.  There was diffuse hypocontractility.  Presently, she denies recent anginal type symptomatology.  She seems that her shortness of breath has improved with initiation of low-dose furosemide and last week she was started on lisinopril at low-dose 5 mg.  Cardiac catheterization she had severe cardiomyopathy and EF estimate was 20-25%.  There was mild CAD as noted above the HPI with 40% diagonal, 40% mid LAD, 40% circumflex, 50-60% mid RCA, 85% stenosis in a small caliber RV marginal branch.  PA pressure was 85-27 mm systolically.  She has now been on Entresto 24/26 mg grams for the last 2 weeks.  She has tolerated this well.  Her blood pressure today is stable.  She does note that she is breathing better.  I am titrating Entresto today to 49/51 mg twice daily.  I will check a be met in a proBNP in addition to a CBC today.  Heart rate is stable and she is tolerating her increased dose of carvedilol.  During her hospitalization I also changed her simvastatin 40 mg to Crestor 20 mg.  Apparently she has noticed more myalgias on this treatment.  Her LDL on simvastatin was 76.  As result I will resume her previous dose of simvastatin 40 mg and will add Zetia 10 mg in an attempt to induce LDL less than 70.  I will see her in 6 weeks for follow-up evaluation or sooner if problems arise.   Medication Adjustments/Labs and Tests Ordered: Current medicines are reviewed at length with the patient today.  Concerns regarding medicines are outlined above.  Medication changes, Labs and Tests ordered today are listed in the Patient Instructions below. Patient Instructions  Medication Instructions:  INCREASE Entresto to 49/51 mg two times daily  STOP Crestor  RESTART simvastatin 40 mg daily-1st dose Tuesday  START Zetia 10 mg  daily  Labwork: BMET, ProBNP, CBC-today  Follow-Up: 6-8 weeks with Dr. Claiborne Billings  Any Other Special Instructions Will Be Listed Below (If Applicable).     If you need a refill on your cardiac medications before your next appointment, please call your pharmacy.      Signed, Shelly Majestic, MD  08/25/2017 1:11 PM    [facility] [address] [place], [place], [place]  [address] Phone: [phone]

## 2017-08-25 ENCOUNTER — Encounter: Payer: Self-pay | Admitting: Cardiovascular Disease

## 2017-08-25 LAB — BASIC METABOLIC PANEL
BUN/Creatinine Ratio: 29 — ABNORMAL HIGH (ref 12–28)
BUN: 25 mg/dL (ref 8–27)
CALCIUM: 9 mg/dL (ref 8.7–10.3)
CHLORIDE: 102 mmol/L (ref 96–106)
CO2: 25 mmol/L (ref 20–29)
Creatinine, Ser: 0.87 mg/dL (ref 0.57–1.00)
GFR calc non Af Amer: 72 mL/min/{1.73_m2} (ref >59)
GFR, EST AFRICAN AMERICAN: 83 mL/min/{1.73_m2} (ref >59)
Glucose: 347 mg/dL — ABNORMAL HIGH (ref 65–99)
POTASSIUM: 4.3 mmol/L (ref 3.5–5.2)
Sodium: 140 mmol/L (ref 134–144)

## 2017-08-25 LAB — CBC
Hematocrit: 39.2 % (ref 34.0–46.6)
Hemoglobin: 12.7 g/dL (ref 11.1–15.9)
MCH: 28.5 pg (ref 26.6–33.0)
MCHC: 32.4 g/dL (ref 31.5–35.7)
MCV: 88 fL (ref 79–97)
PLATELETS: 202 10*3/uL (ref 150–379)
RBC: 4.46 x10E6/uL (ref 3.77–5.28)
RDW: 15.3 % (ref 12.3–15.4)
WBC: 5.3 10*3/uL (ref 3.4–10.8)

## 2017-08-25 LAB — PRO B NATRIURETIC PEPTIDE: NT-Pro BNP: 1700 pg/mL — ABNORMAL HIGH (ref 0–287)

## 2017-08-28 ENCOUNTER — Telehealth: Payer: Self-pay | Admitting: *Deleted

## 2017-08-28 NOTE — Telephone Encounter (Signed)
[facility]ymeds [facility]  Rx # [id number]

## 2017-08-30 NOTE — Telephone Encounter (Signed)
Received notice of approval for Entresto 49/51 mg  08/28/17-08/29/18

## 2017-09-28 ENCOUNTER — Telehealth: Payer: Self-pay | Admitting: Physical Medicine & Rehabilitation

## 2017-09-28 NOTE — Telephone Encounter (Signed)
May schedule for Botox 200U, 64644, G81.10

## 2017-09-28 NOTE — Telephone Encounter (Signed)
Pt's spouse presented requesting an appointment for Botox for contracture of left hand. Pt's spouse stated she saw Dr. Pete GlatterStoneking on 04/15 and he suggested Botox. Pt's last OV was 12/2016. Please advise if okay to schedule for Botox.

## 2017-10-09 ENCOUNTER — Encounter: Payer: Self-pay | Admitting: Neurology

## 2017-10-09 NOTE — Progress Notes (Signed)
Hi, Katrina:  Please send this letter to the address below. And also send a copy of this letter to patient and her husband too. Thanks a lot.  Jan Dils [employer] at [employer] [address] [place] [address]  Marvel Plan, MD PhD Stroke Neurology 10/09/2017 6:28 PM

## 2017-10-10 ENCOUNTER — Telehealth: Payer: Self-pay

## 2017-10-10 NOTE — Telephone Encounter (Signed)
Rn call patients husband that pt has to sign a release form for the letter to be mailed to the lawyer. The husband stated he will come by and have pt sign. RN stated a copy of the letter will be at the front desk for him to pick up for his records. The husband verbalized understanding.

## 2017-10-10 NOTE — Telephone Encounter (Signed)
Marvel Plan, MD at 10/09/2017 6:00 PM   Status: Signed    Hi, Shelly Mcdaniel:  Please send this letter to the address below. And also send a copy of this letter to patient and her husband too. Thanks a lot.  Jan Dils [employer] at [employer] [address] [place] [address]  Marvel Plan, MD PhD Stroke Neurology 10/09/2017 6:28 PM

## 2017-10-11 ENCOUNTER — Telehealth: Payer: Self-pay | Admitting: Cardiovascular Disease

## 2017-10-11 NOTE — Telephone Encounter (Signed)
Letter given to Stanton Kidney to send to lawyer. Release form sign by patient to send letter to lawyer. Lawyer address is Jan Dils [employer] at [employer]:[id number] Dight [address] [place] [address]

## 2017-10-11 NOTE — Telephone Encounter (Signed)
New message ° °Pt verbalized that she is returning call for RN °

## 2017-10-11 NOTE — Telephone Encounter (Signed)
Patient's husband called into state that patient had a blood pressure of 210/110 this morning. The husband gave the patient lisinopril and it went down to 140/90. The husband stated that he did not give the patient Entresto this morning.The patient has been dealing with vertigo and nausea the last two days as well.  The patient is currently on Entresto 49-51 mg so the husband was advised to not give the Lisinopril again. Lisinopril had been discontinued due to the patient starting the Entresto. An appointment has been made for the patient tomorrow with Dr. Tresa Endo. The husband stated that the patient is now asymptomatic and resting.

## 2017-10-12 ENCOUNTER — Encounter: Payer: BC Managed Care – PPO | Attending: Physical Medicine & Rehabilitation

## 2017-10-12 ENCOUNTER — Encounter: Payer: Self-pay | Admitting: Physical Medicine & Rehabilitation

## 2017-10-12 ENCOUNTER — Other Ambulatory Visit: Payer: Self-pay

## 2017-10-12 ENCOUNTER — Ambulatory Visit (HOSPITAL_BASED_OUTPATIENT_CLINIC_OR_DEPARTMENT_OTHER): Payer: BC Managed Care – PPO | Admitting: Physical Medicine & Rehabilitation

## 2017-10-12 ENCOUNTER — Encounter: Payer: Self-pay | Admitting: Cardiovascular Disease

## 2017-10-12 ENCOUNTER — Ambulatory Visit (INDEPENDENT_AMBULATORY_CARE_PROVIDER_SITE_OTHER): Payer: BC Managed Care – PPO | Admitting: Cardiovascular Disease

## 2017-10-12 VITALS — BP 121/80 | HR 80 | Ht 68.0 in | Wt 168.0 lb

## 2017-10-12 VITALS — BP 130/81 | HR 76 | Ht 68.0 in | Wt 170.8 lb

## 2017-10-12 DIAGNOSIS — G8110 Spastic hemiplegia affecting unspecified side: Secondary | ICD-10-CM | POA: Insufficient documentation

## 2017-10-12 DIAGNOSIS — I5042 Chronic combined systolic (congestive) and diastolic (congestive) heart failure: Secondary | ICD-10-CM | POA: Diagnosis not present

## 2017-10-12 DIAGNOSIS — I69359 Hemiplegia and hemiparesis following cerebral infarction affecting unspecified side: Secondary | ICD-10-CM

## 2017-10-12 DIAGNOSIS — Z79899 Other long term (current) drug therapy: Secondary | ICD-10-CM | POA: Diagnosis not present

## 2017-10-12 DIAGNOSIS — I651 Occlusion and stenosis of basilar artery: Secondary | ICD-10-CM

## 2017-10-12 DIAGNOSIS — I10 Essential (primary) hypertension: Secondary | ICD-10-CM | POA: Diagnosis not present

## 2017-10-12 DIAGNOSIS — G8114 Spastic hemiplegia affecting left nondominant side: Secondary | ICD-10-CM

## 2017-10-12 DIAGNOSIS — I43 Cardiomyopathy in diseases classified elsewhere: Secondary | ICD-10-CM

## 2017-10-12 DIAGNOSIS — I2510 Atherosclerotic heart disease of native coronary artery without angina pectoris: Secondary | ICD-10-CM | POA: Diagnosis not present

## 2017-10-12 NOTE — Patient Instructions (Signed)
Medication Instructions:  Your physician recommends that you continue on your current medications as directed. Please refer to the Current Medication list given to you today.  Labwork: Please return for labs in 1 month (CMET, CBC, Lipid, TSH)  Our in office lab hours are Monday-Friday 8:00-4:00, closed for lunch 12:45-1:45 pm.  No appointment needed.  Follow-Up: 6 weeks with Dr. Tresa Endo  Any Other Special Instructions Will Be Listed Below (If Applicable).     If you need a refill on your cardiac medications before your next appointment, please call your pharmacy.

## 2017-10-12 NOTE — Progress Notes (Signed)
Requested refill on scopolamine and zofran

## 2017-10-12 NOTE — Patient Instructions (Signed)

## 2017-10-12 NOTE — Progress Notes (Signed)
Cardiology Office Note    Date:  10/14/2017   ID:  Shelly Mcdaniel, DOB 1956-03-07, MRN [id number]  PCP:  Lajean Manes, MD  Cardiologist:  Shelva Majestic, MD    History of Present Illness:  Shelly Mcdaniel is a 62 y.o. female who presents to the office for a 6-week evaluation.  She is seen earlier than her scheduled appointment after there was some confusion regarding her medications.  Shelly Mcdaniel is a wife of Shelly Mcdaniel who I know through his previous work in cardiac rehabilitation.  Her son is a paramedic, and her daughter is in an anesthesiologist at [employer] in cardiothoracic surgery.  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  She stabilized and a TEE and CUS were unremarkable.  She was discharged with aspirin and simvastatin.   In October 2017, she was readmitted with increased confusion and lethargy for 3 days.  She developed a left hemiparesis and subsequent stroke workup again showed right pontine and bilateral cerebellar but don't go infarcts.  An MRA showed progressive abnormalities.  Ultimately, she was placed on dual antiplatelet therapy and statin therapy with Crestor.  Due to orthostatic hypotension, TED hose were ordered with a blood pressure goal of 5:39 to 767 mm systolically.  She had recently developed symptoms of shortness of breath and vague chest pressure.  She was evaluated on 06/12/2017 and was found to have "fluid in her lungs." She  was referred for an echo Doppler evaluation on 06/26/2017 by Dr. Felipa Eth which showed reduced ejection fraction at 35-40% and grade 2 diastolic dysfunction.  Compared to a 2016 echo.  There was now significant development of systolic dysfunction with global hypokinesis.  She was initially seen by [name]. At that time, I was introduced to the patient due to concerns that the patient most likely will need a  right and left heart cardiac catheterization.  She subsequently underwent laboratory which revealed a serum creatinine of 0.73.  Hemoglobin A1c was increased at 7.8.  Lipid studies in September 2018 showed an LDL cholesterol at 80 with a total cholesterol 145, triglycerides 125, and HDL 42.  Her shortness of breath has improved with low-dose Lasix.  She is on Plavix 75 mg daily.  Apparently she is on simvastatin 40 mg a not Crestor.  With her LV dysfunction.  She was started on lisinopril 5 mg daily.  She is diabetic on insulin and metformin.    I saw her for follow-up evaluation on July 26, 2017 at which time she was feeling better with furosemide and low-dose lisinopril.  She underwent right and left heart cardiac catheterization on August 10, 2017 by me which showed severe cardiomyopathy with diffuse global hypocontractility and an EF of 20-25%.  There was mild multivessel CAD with 40% proximal diagonal stenosis, 40% LAD stenosis after the diagonal, 40% circumflex stenosis, and 50-60% mid RCA stenoses with 85% focal stenosis in a small caliber RV marginal branch.  She has significant right heart pressure elevation with pulmonary hypertension with a mean PA pressure at 48-50 mm.  Allowing her catheterization, I recommended discontinuing lisinopril and 36 hours later she was started on samples of Entresto 24/26 mg twice daily.  I also increase carvedilol to 6.25 mg twice daily.    When I last saw her, in March 2019, she was doing well.  At that time titrated Entresto to 49/51 mg twice  a day.  BNP level 1 month ago on May 8 was 1700.  Currently, her blood pressure has been elevated at home and on one occasion had increased to 210/110.  Had transient development of nausea which improved with Phenergan.  She also experienced some vertigo for several weeks and had some nystagmus.  She is scheduled to have a neurology follow-up evaluation.  Only, it became apparent that she had only been taking Entresto once  a day and never since inception was taking either the 24/26 or the 49/51 mg in a twice a day regimen.  She denies any chest pain.  She denies any shortness of breath.  Yesterday she did take Entresto twice a day.  She presents for further evaluation.  Past Medical History:  Diagnosis Date  . Diabetes mellitus without complication ([place])   . Hyperlipidemia   . Hypertension   . Stroke ([place])   . UTI (urinary tract infection)     Past Surgical History:  Procedure Laterality Date  . BACK SURGERY    . BREAST SURGERY    . COLONOSCOPY WITH PROPOFOL N/A 12/24/2013   Procedure: COLONOSCOPY WITH PROPOFOL;  Surgeon: [name], MD;  Location: [facility];  Service: Endoscopy;  Laterality: N/A;  . RIGHT/LEFT HEART CATH AND CORONARY ANGIOGRAPHY N/A 08/10/2017   Procedure: RIGHT/LEFT HEART CATH AND CORONARY ANGIOGRAPHY;  Surgeon: Troy Sine, MD;  Location: [facility];  Service: Cardiovascular;  Laterality: N/A;    Current Medications: Outpatient Medications Prior to Visit  Medication Sig Dispense Refill  . acetaminophen (TYLENOL) 500 MG tablet Take 1,000 mg by mouth every 6 (six) hours as needed for moderate pain.    Marland Kitchen albuterol (PROVENTIL HFA;VENTOLIN HFA) 108 (90 Base) MCG/ACT inhaler Inhale 1-2 puffs into the lungs every 6 (six) hours as needed for wheezing or shortness of breath. 1 Inhaler 0  . baclofen (LIORESAL) 10 MG tablet Take 1.5 tablets (15 mg total) by mouth 3 (three) times daily. (Patient taking differently: Take 10 mg by mouth at bedtime. ) 120 tablet 1  . benzonatate (TESSALON) 100 MG capsule Take 1 capsule (100 mg total) by mouth every 8 (eight) hours. (Patient taking differently: Take 100 mg by mouth 3 (three) times daily as needed for cough. ) 21 capsule 0  . carvedilol (COREG) 3.125 MG tablet Take 2 tablets (6.25 mg total) by mouth 2 (two) times daily. 180 tablet 3  . clopidogrel (PLAVIX) 75 MG tablet Take 1 tablet (75 mg total) by mouth daily. (Patient taking  differently: Take 75 mg by mouth at bedtime. ) 30 tablet 0  . ezetimibe (ZETIA) 10 MG tablet Take 1 tablet (10 mg total) by mouth daily. 90 tablet 3  . furosemide (LASIX) 20 MG tablet Take 20 mg by mouth daily.     . hydroxypropyl methylcellulose / hypromellose (ISOPTO TEARS / GONIOVISC) 2.5 % ophthalmic solution Place 1 drop into both eyes 3 (three) times daily as needed for dry eyes.    Marland Kitchen ibuprofen (ADVIL,MOTRIN) 200 MG tablet Take 600 mg by mouth every 6 (six) hours as needed for moderate pain.    Marland Kitchen insulin aspart (NOVOLOG) 100 UNIT/ML injection Inject 4 Units into the skin 2 (two) times daily as needed for high blood sugar. Only if B/S is over 275    . LEVEMIR FLEXTOUCH 100 UNIT/ML Pen Inject 25 Units into the skin every evening. (Patient taking differently: Inject 22 Units into the skin every evening. ) 15 mL 11  . lidocaine (LIDODERM)  5 % Place 3 patches onto the skin daily. Remove & Discard patch within 12 hours or as directed by MD (Patient taking differently: Place 1 patch onto the skin daily as needed. Remove & Discard patch within 12 hours or as directed by MD) 60 patch 0  . Melatonin 5 MG TABS Take 5 mg by mouth at bedtime.    . metFORMIN (GLUCOPHAGE-XR) 500 MG 24 hr tablet Take 1,000 mg by mouth every evening.    . ondansetron (ZOFRAN) 4 MG tablet Take 1 tablet (4 mg total) by mouth every 8 (eight) hours as needed for nausea or vomiting. (Patient taking differently: Take 4 mg by mouth at bedtime. ) 12 tablet 0  . Phenazopyridine HCl (AZO TABS PO) Take 1 tablet by mouth 3 (three) times daily as needed. Bladder pain    . pregabalin (LYRICA) 25 MG capsule Take 50 mg by mouth at bedtime.     . sacubitril-valsartan (ENTRESTO) 49-51 MG Take 1 tablet by mouth 2 (two) times daily. 60 tablet 3  . scopolamine (TRANSDERM-SCOP) 1 MG/3DAYS Place 1 patch (1.5 mg total) onto the skin every 3 (three) days. 10 patch 12  . senna-docusate (SENOKOT-S) 8.6-50 MG tablet Take 1 tablet by mouth at bedtime.    .  sertraline (ZOLOFT) 50 MG tablet Take 50 mg by mouth at bedtime.     . simvastatin (ZOCOR) 40 MG tablet Take 1 tablet (40 mg total) by mouth at bedtime. 90 tablet 3   No facility-administered medications prior to visit.      Allergies:   Bee venom; Atorvastatin; Sulfamethoxazole-trimethoprim; and Codeine   Social History   Socioeconomic History  . Marital status: Married    Spouse name: Not on file  . Number of children: Not on file  . Years of education: Not on file  . Highest education level: Not on file  Occupational History  . Not on file  Social Needs  . Financial resource strain: Not on file  . Food insecurity:    Worry: Not on file    Inability: Not on file  . Transportation needs:    Medical: Not on file    Non-medical: Not on file  Tobacco Use  . Smoking status: Never Smoker  . Smokeless tobacco: Never Used  Substance and Sexual Activity  . Alcohol use: No    Alcohol/week: 0.0 oz  . Drug use: No  . Sexual activity: Not on file  Lifestyle  . Physical activity:    Days per week: Not on file    Minutes per session: Not on file  . Stress: Not on file  Relationships  . Social connections:    Talks on phone: Not on file    Gets together: Not on file    Attends religious service: Not on file    Active member of club or organization: Not on file    Attends meetings of clubs or organizations: Not on file    Relationship status: Not on file  Other Topics Concern  . Not on file  Social History Narrative  . Not on file     Family History:  The patient's family history includes CAD in her brother and father; Dementia in her mother; Diabetes in her brother, father, other, and sister; Heart disease in her other; Hyperlipidemia in her other; Hypertension in her other; Kidney failure in her brother; Stroke in her brother, father, mother, and other.   ROS General: Negative; No fevers, chills, or night sweats;  HEENT: Negative;  No changes in vision or hearing, sinus  congestion, difficulty swallowing Pulmonary: Negative; No cough, wheezing, shortness of breath, hemoptysis Cardiovascular: See history of present illness GI: Negative; No nausea, vomiting, diarrhea, or abdominal pain GU: Negative; No dysuria, hematuria, or difficulty voiding Musculoskeletal: Negative; no myalgias, joint pain, or weakness Hematologic/Oncology: Negative; no easy bruising, bleeding Endocrine: Positive for diabetes mellitus Neuro: Positive for initial CVA August 2018 with stroke extension October 2018 associated with orthostatic hypotension.  Neuropathy. Skin: Negative; No rashes or skin lesions Psychiatric: Negative; No behavioral problems, depression Sleep: Negative; No snoring, daytime sleepiness, hypersomnolence, bruxism, restless legs, hypnogognic hallucinations, no cataplexy Other comprehensive 14 point system review is negative.   PHYSICAL EXAM:   VS:  BP 130/81   Pulse 76   Ht _0  (1.727 m)   Wt 170 lb 12.8 oz (77.5 kg)   BMI 25.97 kg/m     Repeat blood pressure by me was 128/80  Wt Readings from Last 3 Encounters:  10/12/17 170 lb 12.8 oz (77.5 kg)  10/12/17 168 lb (76.2 kg)  08/24/17 171 lb (77.6 kg)    General: Alert, oriented, no distress.  Skin: normal turgor, no rashes, warm and dry HEENT: Normocephalic, atraumatic. Pupils equal round and reactive to light; sclera anicteric; extraocular muscles intact; no nystagmus Nose without nasal septal hypertrophy Mouth/Parynx benign; Mallinpatti scale 2 Neck: No JVD, no carotid bruits; normal carotid upstroke Lungs: clear to ausculatation and percussion; no wheezing or rales Chest wall: without tenderness to palpitation Heart: PMI not displaced, RRR, s1 s2 normal, 1/6 systolic murmur, no diastolic murmur, no rubs, gallops, thrills, or heaves Abdomen: soft, nontender; no hepatosplenomehaly, BS+; abdominal aorta nontender and not dilated by palpation. Back: no CVA tenderness Pulses 2+ Musculoskeletal: full  range of motion, normal strength, no joint deformities Extremities: no clubbing cyanosis or edema, Homan's sign negative  Neurologic: residual left hemiparesis and left foot drop Psychologic: Normal mood and affect   Studies/Labs Reviewed:   EKG:  EKG is ordered today. ECG (independently read by me): Normal sinus rhythm at 76 bpm.  QTc intervals _1 ms.  PR interval _2 ms.  No ectopy.  August 24, 2017 ECG (independently read by me): normal sinus rhythm at 75 bpm.  Nonspecific ST T changes. QT interval 446; QTc interval 498 ms.   07/26/2017 ECG (independently read by me): Normal sinus rhythm at 70 bpm.  Possible left atrial enlargement.  QS complex V1 through V3  Recent Labs: BMP Latest Ref Rng & Units 08/24/2017 08/09/2017 01/11/2017  Glucose 65 - 99 mg/dL 347(H) 138(H) 115(H)  BUN 8 - 27 mg/dL 25 16 26(H)  Creatinine 0.57 - 1.00 mg/dL 0.87 0.67 0.77  BUN/Creat Ratio 12 - 28 29(H) 24 -  Sodium 134 - 144 mmol/L 140 143 140  Potassium 3.5 - 5.2 mmol/L 4.3 4.0 3.3(L)  Chloride 96 - 106 mmol/L 102 106 103  CO2 20 - 29 mmol/L _3 Calcium 8.7 - 10.3 mg/dL 9.0 9.2 9.6     Hepatic Function Latest Ref Rng & Units 08/09/2017 01/11/2017 08/24/2016  Total Protein 6.0 - 8.5 g/dL 6.7 6.8 7.1  Albumin 3.6 - 4.8 g/dL 3.9 3.9 4.1  AST 0 - 40 IU/L _4 ALT 0 - 32 IU/L 10 13(L) 12(L)  Alk Phosphatase 39 - 117 IU/L 108 62 55  Total Bilirubin 0.0 - 1.2 mg/dL 0.3 0.5 0.2(L)    CBC Latest Ref Rng & Units 08/24/2017 08/09/2017 01/11/2017  WBC 3.4 - 10.8 x10E3/uL 5.3 5.0 5.3  Hemoglobin 11.1 - 15.9 g/dL 12.7 11.2 12.4  Hematocrit 34.0 - 46.6 % 39.2 34.0 35.0(L)  Platelets 150 - 379 x10E3/uL 202 171 179   Lab Results  Component Value Date   MCV 88 08/24/2017   MCV 86 08/09/2017   MCV 85.0 01/11/2017   Lab Results  Component Value Date   TSH 2.560 08/09/2017   Lab Results  Component Value Date   HGBA1C 6.7 (H) 04/05/2016     BNP No results found for: BNP  ProBNP    Component  Value Date/Time   PROBNP 1,700 (H) 08/24/2017 1543     Lipid Panel     Component Value Date/Time   CHOL 133 08/09/2017 0856   TRIG 121 08/09/2017 0856   HDL 33 (L) 08/09/2017 0856   CHOLHDL 4.0 08/09/2017 0856   CHOLHDL 5.7 04/07/2016 0317   VLDL 25 04/07/2016 0317   LDLCALC 76 08/09/2017 0856     RADIOLOGY: No results found.   Additional studies/ records that were reviewed today include:  I reviewed the patient's prior records, neurologic evaluation and evaluation from last week in addition to laboratory; cardiac catheterization findings  ASSESSMENT:    1. Cardiomyopathy as manifestation of underlying disease ([place])   2. Chronic combined systolic and diastolic heart failure ([place])   3. Essential hypertension   4. Coronary artery disease involving native coronary artery of native heart without angina pectoris   5. Medication management   6. Basilar artery stenosis   7. 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  She suffered an initial stroke in August 2017 and 2 months later had stroke extension resulting in residual left hemiparesis, and involvement of foot drop.  Recently, she developed symptoms suggestive of congestive heart failure and an echo Doppler assessment was found to have systolic and diastolic dysfunction with an EF of 35-40% and grade 2 diastolic impairment.  There was mitral annular calcification with mild MR.  She had mild left atrial dilatation.  There was diffuse hypocontractility.  She denies recent anginal type symptomatology.  Cardiac catheterization demonstrated a severe cardiomyopathy with an EF estimated at 20 to 25%.   There was mild CADI with 40% diagonal, 40% mid LAD, 40% circumflex, 50-60% mid RCA, 85% stenosis in a small caliber  RV marginal branch.  PA pressure was 61-44 mm systolically.  She was taken off lisinopril and started on Entresto and when last seen her dose was titrated to 49/51 mg twice a day.  There has been significant improvement in her shortness of breath with initiation of Entresto.  However, apparently she was taking the medication incorrectly and has only been taking once a day dosing.  She recently had significant blood pressure lability.  For the past 1 to 2 days she has now been taking Entresto twice a day.  Her blood pressure today is improved.  She also had developed some nystagmus and also some nausea during that time several weeks ago and is scheduled to undergo follow-up neurologic evaluation.  Presently, her shortness of breath has improved.  There is no edema.  I was planning to further titrate Entresto at today's office visit but since she had been taking her dosing correctly we will now continue with the 49/51 mg twice daily regimen.  I will see her back in the office 6 weeks and prior to that evaluation repeat laboratory will be obtained.  If she is doing well, further titration to 97/103 mg twice daily will be done at that time.  Is not having any anginal symptoms.  With her CAD, target LDL is less than 70.  Back on simvastatin 40 mg daily addition to Zetia 10 mg which was added for more aggressive lipid therapy intervention.  See her in 6 weeks for reevaluation.   Medication Adjustments/Labs and Tests Ordered: Current medicines are reviewed at length with the patient today.  Concerns regarding medicines are outlined above.  Medication changes, Labs and Tests ordered today are listed in the Patient Instructions below. Patient Instructions  Medication Instructions:  Your physician recommends that you continue on your current medications as directed. Please refer to the Current Medication list given to you today.  Labwork: Please return for labs in 1 month (CMET, CBC, Lipid, TSH)  Our in office lab  hours are Monday-Friday 8:00-4:00, closed for lunch 12:45-1:45 pm.  No appointment needed.  Follow-Up: 6 weeks with Dr. Claiborne Billings  Any Other Special Instructions Will Be Listed Below (If Applicable).     If you need a refill on your cardiac medications before your next appointment, please call your pharmacy.      Signed, Shelva Majestic, MD  10/14/2017 12:14 PM    [facility] [address] [place], [place], [place]  [address] Phone: [phone]

## 2017-10-12 NOTE — Progress Notes (Signed)
Botox Injection for spasticity using needle EMG guidance  Dilution: 50 Units/ml Indication: Severe spasticity which interferes with ADL,mobility and/or  hygiene and is unresponsive to medication management and other conservative care Informed consent was obtained after describing risks and benefits of the procedure with the patient. This includes bleeding, bruising, infection, excessive weakness, or medication side effects. A REMS form is on file and signed. Needle: 27g 1" needle electrode Number of units per muscle brachioradialis50 FDS 50 FDP 25 FCR 25 PT 50  All injections were done after obtaining appropriate EMG activity and after negative drawback for blood. The patient tolerated the procedure well. Post procedure instructions were given. A followup appointment was made.   Based on EMG activity consider reduction of FDS dose to 25U and increase to FDP to 50U, will check clinical effect at 6 wk f/u  HHOT for LUE

## 2017-10-14 ENCOUNTER — Encounter: Payer: Self-pay | Admitting: Cardiovascular Disease

## 2017-10-17 ENCOUNTER — Ambulatory Visit: Payer: BC Managed Care – PPO | Admitting: Cardiovascular Disease

## 2017-10-18 ENCOUNTER — Ambulatory Visit: Payer: BC Managed Care – PPO | Admitting: Cardiovascular Disease

## 2017-10-20 ENCOUNTER — Encounter: Payer: Self-pay | Admitting: Rehabilitative and Restorative Service Providers"

## 2017-10-20 ENCOUNTER — Ambulatory Visit
Payer: BC Managed Care – PPO | Attending: Geriatric Medicine | Admitting: Rehabilitative and Restorative Service Providers"

## 2017-10-20 DIAGNOSIS — H8111 Benign paroxysmal vertigo, right ear: Secondary | ICD-10-CM | POA: Diagnosis present

## 2017-10-20 DIAGNOSIS — R2689 Other abnormalities of gait and mobility: Secondary | ICD-10-CM | POA: Insufficient documentation

## 2017-10-20 DIAGNOSIS — R42 Dizziness and giddiness: Secondary | ICD-10-CM | POA: Diagnosis present

## 2017-10-20 NOTE — Patient Instructions (Signed)
HAVE HELP FOR SAFETY AND TO ROLL TO THE RIGHT:  Habituation - Tip Card  1.The goal of habituation training is to assist in decreasing symptoms of vertigo, dizziness, or nausea provoked by specific head and body motions. 2.These exercises may initially increase symptoms; however, be persistent and work through symptoms. With repetition and time, the exercises will assist in reducing or eliminating symptoms. 3.Exercises should be stopped and discussed with the therapist if you experience any of the following: - Sudden change or fluctuation in hearing - New onset of ringing in the ears, or increase in current intensity - Any fluid discharge from the ear - Severe pain in neck or back - Extreme nausea  Copyright  VHI. All rights reserved.   Habituation - Rolling   With pillow under head, start on back. Roll to your right side.  Hold until dizziness stops, plus 20 seconds and then roll to the left side.  Hold until dizziness stops, plus 20 seconds.  Repeat sequence 5 times per session. Do 2 sessions per day.  Copyright  VHI. All rights reserved.

## 2017-10-21 ENCOUNTER — Other Ambulatory Visit: Payer: Self-pay

## 2017-10-21 NOTE — Therapy (Signed)
[facility] [address] [place], [place], [address] Phone: [phone]   Fax:  [phone]  Physical Therapy Evaluation  Patient Details  Name: Shelly Mcdaniel MRN: [id number] Date of Birth: 1955/08/22 Referring Provider: Merlene Laughter, MD   Encounter Date: 10/20/2017  PT End of Session - 10/21/17 1039    Visit Number  1    Number of Visits  6    Date for PT Re-Evaluation  12/19/17    Authorization Type  state BCBS    PT Start Time  1320    PT Stop Time  1410    PT Time Calculation (min)  50 min    Equipment Utilized During Treatment  -- needs assist for mobility (transfers and bed mobility due to h/o CVA)    Activity Tolerance  Patient tolerated treatment well    Behavior During Therapy  [facility] for tasks assessed/performed       Past Medical History:  Diagnosis Date  . Diabetes mellitus without complication (HCC)   . Hyperlipidemia   . Hypertension   . Stroke (HCC)   . UTI (urinary tract infection)     Past Surgical History:  Procedure Laterality Date  . BACK SURGERY    . BREAST SURGERY    . COLONOSCOPY WITH PROPOFOL N/A 12/24/2013   Procedure: COLONOSCOPY WITH PROPOFOL;  Surgeon: Charolett Bumpers, MD;  Location: [facility];  Service: Endoscopy;  Laterality: N/A;  . RIGHT/LEFT HEART CATH AND CORONARY ANGIOGRAPHY N/A 08/10/2017   Procedure: RIGHT/LEFT HEART CATH AND CORONARY ANGIOGRAPHY;  Surgeon: Lennette Bihari, MD;  Location: [facility];  Service: Cardiovascular;  Laterality: N/A;    There were no vitals filed for this visit.   Subjective Assessment - 10/20/17 1324    Subjective  The patient reports h/o intermittent vertigo and BPPV.  She notes she has been treated with canolith repositioning maneuvers successfully.  At onset of CVA, she also experienced dizziness with nausea/vomitting and she feels that her prior h/o vertigo hiundered her family's ability to recognize her stroke.     The patient  has had extensive therapy for CVA and is here today to address vertigo.  Her hsuband notes she is being treated for CHF at this time too.     Patient is accompained by:  Family member    Pertinent History  h/o CVA 2017 x 2; h/o vertigo unrelated to stroke; frequent UTIs.     Patient Stated Goals  Stop the vertigo when I roll in bed.     Currently in Pain?  No/denies         [facility] PT Assessment - 10/20/17 1336      Assessment   Medical Diagnosis  vertigo    Referring Provider  Merlene Laughter, MD    Onset Date/Surgical Date  -- 3 weeks ago    Prior Therapy  known to our clinic from extensive rehab s/p CVA; PT focused on vertigo due to nature of referral.  Patient's husband notes patient weaker due to recent cardiac issues, but functioning at same level (needs assist)      Precautions   Precautions  Fall      Restrictions   Weight Bearing Restrictions  No      Balance Screen   Has the patient fallen in the past 6 months  No    Has the patient had a decrease in activity level because of a fear of falling?   Yes due to h/o strokes  Is the patient reluctant to leave their home because of a fear of falling?   Yes has husbands help      Home Environment   Living Environment  Private residence    Living Arrangements  Spouse/significant other    Type of Home  House    Home Access  Ramped entrance    Home Layout  Able to live on main level with bedroom/bathroom    Home Equipment  Wheelchair - manual;Cane - quad railing on ramp    Additional Comments  wears L AFO      Prior Function   Level of Independence  Needs assistance with ADLs;Needs assistance with homemaking;Needs assistance with gait;Needs assistance with transfers      Transfers   Transfers  Sit to Stand;Stand Pivot Transfers;Squat Pivot Transfers    Sit to Stand  4: Min assist    Stand Pivot Transfers  4: Min assist    Squat Pivot Transfers  4: Min assist    Comments  w/c<>mat with min A and verbal cues for technique            Vestibular Assessment - 10/20/17 1339      Vestibular Assessment   General Observation  The patient notes dizziness moves L to R when it occurs.      Symptom Behavior   Type of Dizziness  Spinning    Frequency of Dizziness  daily    Duration of Dizziness  seconds    Aggravating Factors  Rolling to left;Rolling to right    Relieving Factors  Head stationary      Occulomotor Exam   Occulomotor Alignment  Abnormal L eye hypertropia    Spontaneous  Absent    Smooth Pursuits  -- dysconjugate gaze? from prior CVAs    Comment  *Eyes move dysconjugately      Positional Testing   Dix-Hallpike  Dix-Hallpike Left;Dix-Hallpike Right    Horizontal Canal Testing  Horizontal Canal Right;Horizontal Canal Left      Dix-Hallpike Right   Dix-Hallpike Right Duration  0    Dix-Hallpike Right Symptoms  No nystagmus      Dix-Hallpike Left   Dix-Hallpike Left Duration  0    Dix-Hallpike Left Symptoms  No nystagmus      Horizontal Canal Right   Horizontal Canal Right Duration  30 seconds    Horizontal Canal Right Symptoms  Geotrophic;Nystagmus      Horizontal Canal Left   Horizontal Canal Left Duration  15 seconds    Horizontal Canal Left Symptoms  Geotrophic;Nystagmus          Objective measurements completed on examination: See above findings.       Vestibular Treatment/Exercise - 10/21/17 1043      Vestibular Treatment/Exercise   Vestibular Treatment Provided  Canalith Repositioning;Habituation    Canalith Repositioning  Canal Roll Right    Habituation Exercises  Horizontal Roll      Canal Roll Right   Number of Reps   1    Overall Response   No change    Response Details   continued with geotropic nystagmus upon retesting.  Horizontal canal roll had to be modified due to patient's mobility status and being unable to obtain prone position.  PT had patient roll as far as she could tolerate onto left side/shoulder with PT supporting patient's head, however could not  get nose to point towards ground, which is the optimal position to reposition otoconia duirng horizontal roll, therefore switched to habituation approach for  HEP       Horizontal Roll   Number of Reps   4    Symptom Description   after 2nd repetition, nystagmus reduced significantly.  Discussed and demonstrated with husband noting that patient should have help for safety during exercise.            PT Education - 10/20/17 1408    Education provided  Yes    Education Details  HEP for habituation rolling    Person(s) Educated  Patient;Spouse    Methods  Explanation;Demonstration;Handout    Comprehension  Verbalized understanding;Returned demonstration;Verbal cues required       PT Short Term Goals - 10/21/17 1045      PT SHORT TERM GOAL #1   Title  The patient will perform HEP for horizontal canal habituation with husband's or caregivers assistance.    Time  4    Period  Weeks    Target Date  11/19/17        PT Long Term Goals - 10/21/17 1046      PT LONG TERM GOAL #1   Title  The patient will have negative horizontal roll test indicating resolution of BPPV.    Time  8    Period  Weeks    Target Date  12/19/17      PT LONG TERM GOAL #2   Title  The patient will subjectively report no nausea, dizziness with rolling in bed.    Time  8    Period  Weeks    Target Date  12/19/17             Plan - 10/21/17 1047    Clinical Impression Statement  The patient is a 62 yo female known to our clinic from prior h/o CVAs with significant mobility impairments.  She presents today with R horizontal canalithiasis per geotropic nystagmus viewed in room light worse with R horizontal roll test.  Patient's left side weakness hinders her ability to tolerate horizontal canolith repositioning maneuver and PT instructed family/patient in habituation exercises.    History and Personal Factors relevant to plan of care:  CVA, h/o recurring BPPV    Clinical Presentation  Stable     Clinical Presentation due to:  only addressing BPPV    Clinical Decision Making  Low    Rehab Potential  Good    Clinical Impairments Affecting Rehab Potential  h/o CVA with L hemiplegia    PT Frequency  1x / week    PT Duration  6 weeks    PT Treatment/Interventions  Canalith Repostioning;Vestibular;Neuromuscular re-education;Patient/family education    PT Next Visit Plan  recheck R horizontal canal, check ability to perform habituation, treat BPPV    Consulted and Agree with Plan of Care  Patient       Patient will benefit from skilled therapeutic intervention in order to improve the following deficits and impairments:  Dizziness, Impaired vision/preception, Decreased balance, Abnormal gait  Visit Diagnosis: BPPV (benign paroxysmal positional vertigo), right  Dizziness and giddiness  Other abnormalities of gait and mobility     Problem List Patient Active Problem List   Diagnosis Date Noted  . Cardiomyopathy (HCC)   . Coronary artery disease involving native coronary artery of native heart without angina pectoris   . Ischemic cardiomyopathy 07/04/2017  . 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 Orthostatic hypotension 04/08/2016  . Muscle cramps 04/08/2016  . 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  Cerebral thrombosis with cerebral infarction 04/06/2016  . Basilar artery stenosis   . Dysarthria 04/05/2016  . History of CVA with residual deficit 04/05/2016  . Sinus tachycardia 04/05/2016  . Acute encephalopathy 04/05/2016  . Chronic pain 04/05/2016  . Other hyperlipidemia 04/05/2016  . Ischemic stroke (HCC)   . 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 TIA (transient ischemic attack) 11/07/2014  . Uncontrolled hypertension 11/07/2014  . Controlled type 2 diabetes mellitus with diabetic nephropathy (HCC) 11/07/2014    Deshaun Weisinger, PT 10/21/2017, 10:51 AM  [facility] [address] [place], [place], [address] Phone: [phone]   Fax:  [phone]  Name: Shelly Mcdaniel MRN: [id number] Date of Birth: 15-Apr-1956

## 2017-10-23 ENCOUNTER — Ambulatory Visit: Payer: BC Managed Care – PPO | Admitting: Physical Therapy

## 2017-11-03 ENCOUNTER — Ambulatory Visit: Payer: BC Managed Care – PPO | Admitting: Rehabilitative and Restorative Service Providers"

## 2017-11-08 ENCOUNTER — Other Ambulatory Visit: Payer: Self-pay | Admitting: Cardiology

## 2017-11-28 ENCOUNTER — Encounter

## 2017-11-29 ENCOUNTER — Ambulatory Visit
Payer: BC Managed Care – PPO | Attending: Geriatric Medicine | Admitting: Rehabilitative and Restorative Service Providers"

## 2017-11-29 ENCOUNTER — Encounter: Payer: Self-pay | Admitting: Rehabilitative and Restorative Service Providers"

## 2017-11-29 DIAGNOSIS — R42 Dizziness and giddiness: Secondary | ICD-10-CM | POA: Diagnosis present

## 2017-11-29 DIAGNOSIS — H8111 Benign paroxysmal vertigo, right ear: Secondary | ICD-10-CM | POA: Diagnosis not present

## 2017-11-29 NOTE — Therapy (Signed)
[facility] [address] [place], [place], [address] Phone: [phone]   Fax:  [phone]  Physical Therapy Treatment and D/C summary  Patient Details  Name: Shelly Mcdaniel MRN: [id number] Date of Birth: August 14, 1955 Referring Provider: Lajean Manes, MD   Encounter Date: 11/29/2017  PT End of Session - 11/29/17 1417    Visit Number  2    Number of Visits  6    Date for PT Re-Evaluation  12/19/17    Authorization Type  state BCBS    PT Start Time  1325    PT Stop Time  7680    PT Time Calculation (min)  30 min    Equipment Utilized During Treatment  Gait belt needs assist for mobility (transfers and bed mobility due to h/o CVA)    Activity Tolerance  Patient tolerated treatment well    Behavior During Therapy  [facility] for tasks assessed/performed       Past Medical History:  Diagnosis Date  . Diabetes mellitus without complication ([place])   . Hyperlipidemia   . Hypertension   . Stroke ([place])   . UTI (urinary tract infection)     Past Surgical History:  Procedure Laterality Date  . BACK SURGERY    . BREAST SURGERY    . COLONOSCOPY WITH PROPOFOL N/A 12/24/2013   Procedure: COLONOSCOPY WITH PROPOFOL;  Surgeon: [name], MD;  Location: [facility];  Service: Endoscopy;  Laterality: N/A;  . RIGHT/LEFT HEART CATH AND CORONARY ANGIOGRAPHY N/A 08/10/2017   Procedure: RIGHT/LEFT HEART CATH AND CORONARY ANGIOGRAPHY;  Surgeon: Troy Sine, MD;  Location: [facility];  Service: Cardiovascular;  Laterality: N/A;    There were no vitals filed for this visit.  Subjective Assessment - 11/29/17 1328    Subjective  The patient had missed prior appointments and rescheduled to today.  She notes the vertigo cleared about 2 weeks ago with rolling side to side.  Her husband reports she is real weak and having increased difficulty with transfers.   She is laying down quicker due to weakness and vomitted yesterday--  unsure if related to laying down quicker however she doesn't note any dizziness or nystagmus per her report.    Patient Stated Goals  Stop the vertigo when I roll in bed.     Currently in Pain?  No/denies             Vestibular Assessment - 11/29/17 1333      Vestibular Assessment   General Observation  patient required min A for transfers w/c<>mat today      Positional Testing   Sidelying Test  Sidelying Right;Sidelying Left    Horizontal Canal Testing  Horizontal Canal Right;Horizontal Canal Left      Dix-Hallpike Right   Dix-Hallpike Right Duration  no nystagmus viewed- the patient reports a sense that her floaters are moving to the right side; PT observed eyes with frenzel lenses and no nystagmus viewed    Dix-Hallpike Right Symptoms  No nystagmus      Dix-Hallpike Left   Dix-Hallpike Left Duration  0    Dix-Hallpike Left Symptoms  No nystagmus      Sidelying Right   Sidelying Right Duration  0    Sidelying Right Symptoms  No nystagmus      Sidelying Left   Sidelying Left Duration  0    Sidelying Left Symptoms  No nystagmus  [place] [facility] PT Treatment/Exercise - 11/29/17 1331      Transfers   Transfers  Stand Pivot Transfers    Sit to Stand  --    Stand Pivot Transfers  3: Mod assist    Comments  Patient pulled posteriiorly during initiation of sit>stand and required rocking and mod A to move to standing.      Self-Care   Self-Care  Other Self-Care Comments    Other Self-Care Comments   patient reports she hasnt' been able to exercise since the first time she saw therapy due to CHF and heart issues, husband notes that her knee bothers her some times      Vestibular Treatment/Exercise - 11/29/17 1418      Vestibular Treatment/Exercise   Vestibular Treatment Provided  Habituation      Horizontal Roll   Number of Reps   4    Symptom Description   the first 2 reps, the patient had report of subjective sensation like spinning could start,  although no true vertigo felt; after 2nd rep, she noted symptom improved.            PT Education - 11/29/17 1417    Education provided  Yes    Education Details  recommended continued habituation x 2 more weeks to resolve trace of symptom with right rolling (no nystagmus)    Person(s) Educated  Patient    Methods  Explanation;Demonstration;Handout    Comprehension  Verbalized understanding;Returned demonstration       PT Short Term Goals - 11/29/17 1423      PT SHORT TERM GOAL #1   Title  The patient will perform HEP for horizontal canal habituation with husband's or caregivers assistance.    Time  4    Period  Weeks    Status  Achieved        PT Long Term Goals - 11/29/17 1423      PT LONG TERM GOAL #1   Title  The patient will have negative horizontal roll test indicating resolution of BPPV.    Time  8    Period  Weeks    Status  Achieved      PT LONG TERM GOAL #2   Title  The patient will subjectively report no nausea, dizziness with rolling in bed.    Baseline  Trace amount of dizziness with right rolling.    Time  8    Period  Weeks    Status  Partially Met            Plan - 11/29/17 1424    Clinical Impression Statement  The patient met STG and one LTG.  She still has a trace subjective sensation of mild dizziness with right rolling, but no nystagmus viewed.  Patient able to do habituation with husband's assist.  Recommended she continue x 2 more weeks.     PT Treatment/Interventions  Canalith Repostioning;Vestibular;Neuromuscular re-education;Patient/family education    PT Next Visit Plan  d/c    Consulted and Agree with Plan of Care  Patient;Family member/caregiver    Family Member Consulted  spouse       Patient will benefit from skilled therapeutic intervention in order to improve the following deficits and impairments:  Dizziness, Impaired vision/preception, Decreased balance, Abnormal gait  Visit Diagnosis: BPPV (benign paroxysmal positional  vertigo), right  Dizziness and giddiness  PHYSICAL THERAPY DISCHARGE SUMMARY  Visits from Start of Care: 2  Current functional level related to goals / functional outcomes: See above  Remaining deficits: Trace subjective sensation of dizziness   Education / Equipment: Home program for habituation  Plan: Patient agrees to discharge.  Patient goals were partially met. Patient is being discharged due to meeting the stated rehab goals.  ?????          Thank you for the referral of this patient. Rudell Cobb, MPT  [place], PT 11/29/2017, 2:26 PM  [facility] [address] [place], [place], [address] Phone: [phone]   Fax:  [phone]  Name: Shelly Mcdaniel MRN: [id number] Date of Birth: 08-Oct-1955

## 2017-12-06 LAB — CBC
Hematocrit: 38 % (ref 34.0–46.6)
Hemoglobin: 12.1 g/dL (ref 11.1–15.9)
MCH: 28.9 pg (ref 26.6–33.0)
MCHC: 31.8 g/dL (ref 31.5–35.7)
MCV: 91 fL (ref 79–97)
PLATELETS: 207 10*3/uL (ref 150–450)
RBC: 4.18 x10E6/uL (ref 3.77–5.28)
RDW: 14.9 % (ref 12.3–15.4)
WBC: 4.6 10*3/uL (ref 3.4–10.8)

## 2017-12-06 LAB — COMPREHENSIVE METABOLIC PANEL
ALBUMIN: 4 g/dL (ref 3.6–4.8)
ALK PHOS: 100 IU/L (ref 39–117)
ALT: 15 IU/L (ref 0–32)
AST: 13 IU/L (ref 0–40)
Albumin/Globulin Ratio: 1.8 (ref 1.2–2.2)
BUN / CREAT RATIO: 16 (ref 12–28)
BUN: 15 mg/dL (ref 8–27)
Bilirubin Total: 0.4 mg/dL (ref 0.0–1.2)
CO2: 23 mmol/L (ref 20–29)
Calcium: 8.9 mg/dL (ref 8.7–10.3)
Chloride: 98 mmol/L (ref 96–106)
Creatinine, Ser: 0.92 mg/dL (ref 0.57–1.00)
GFR, EST AFRICAN AMERICAN: 78 mL/min/{1.73_m2} (ref >59)
GFR, EST NON AFRICAN AMERICAN: 67 mL/min/{1.73_m2} (ref >59)
GLOBULIN, TOTAL: 2.2 g/dL (ref 1.5–4.5)
Glucose: 548 mg/dL (ref 65–99)
Potassium: 4.2 mmol/L (ref 3.5–5.2)
SODIUM: 136 mmol/L (ref 134–144)
TOTAL PROTEIN: 6.2 g/dL (ref 6.0–8.5)

## 2017-12-06 LAB — PRO B NATRIURETIC PEPTIDE: NT-Pro BNP: 233 pg/mL (ref 0–287)

## 2017-12-07 ENCOUNTER — Encounter: Payer: Self-pay | Admitting: Cardiovascular Disease

## 2017-12-07 ENCOUNTER — Ambulatory Visit (INDEPENDENT_AMBULATORY_CARE_PROVIDER_SITE_OTHER): Payer: BC Managed Care – PPO | Admitting: Cardiovascular Disease

## 2017-12-07 VITALS — BP 148/94 | HR 82 | Ht 68.5 in | Wt 167.0 lb

## 2017-12-07 DIAGNOSIS — I5042 Chronic combined systolic (congestive) and diastolic (congestive) heart failure: Secondary | ICD-10-CM | POA: Diagnosis not present

## 2017-12-07 DIAGNOSIS — E7849 Other hyperlipidemia: Secondary | ICD-10-CM | POA: Diagnosis not present

## 2017-12-07 DIAGNOSIS — Z79899 Other long term (current) drug therapy: Secondary | ICD-10-CM

## 2017-12-07 DIAGNOSIS — I69359 Hemiplegia and hemiparesis following cerebral infarction affecting unspecified side: Secondary | ICD-10-CM | POA: Diagnosis not present

## 2017-12-07 DIAGNOSIS — I2510 Atherosclerotic heart disease of native coronary artery without angina pectoris: Secondary | ICD-10-CM | POA: Diagnosis not present

## 2017-12-07 DIAGNOSIS — E1140 Type 2 diabetes mellitus with diabetic neuropathy, unspecified: Secondary | ICD-10-CM | POA: Diagnosis not present

## 2017-12-07 DIAGNOSIS — I43 Cardiomyopathy in diseases classified elsewhere: Secondary | ICD-10-CM

## 2017-12-07 DIAGNOSIS — Z794 Long term (current) use of insulin: Secondary | ICD-10-CM

## 2017-12-07 DIAGNOSIS — I10 Essential (primary) hypertension: Secondary | ICD-10-CM

## 2017-12-07 MED ORDER — SACUBITRIL-VALSARTAN 97-103 MG PO TABS: 1.0000 | ORAL_TABLET | Freq: Two times a day (BID) | ORAL | 3 refills | Status: DC

## 2017-12-07 NOTE — Progress Notes (Signed)
Cardiology Office Note    Date:  12/09/2017   ID:  Shelly Mcdaniel, DOB 26-Feb-1956, MRN [id number]  PCP:  Lajean Manes, MD  Cardiologist:  Shelva Majestic, MD    History of Present Illness:  Shelly Mcdaniel is a 62 y.o. female who presents to the office for a 7-week evaluation.  Shelly Mcdaniel is a wife of Mr. Shelly Mcdaniel who I know through his previous work in cardiac rehabilitation.  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  She stabilized and a TEE and CUS were unremarkable.  She was discharged with aspirin and simvastatin.   In October 2017, she was readmitted with increased confusion and lethargy for 3 days.  She developed a left hemiparesis and subsequent stroke workup again showed right pontine and bilateral cerebellar but don't go infarcts.  An MRA showed progressive abnormalities.  Ultimately, she was placed on dual antiplatelet therapy and statin therapy with Crestor.  Due to orthostatic hypotension, TED hose were ordered with a blood pressure goal of 6:28 to 366 mm systolically.  She had recently developed symptoms of shortness of breath and vague chest pressure.  She was evaluated on 06/12/2017 and was found to have "fluid in Shelly lungs." She  was referred for an echo Doppler evaluation on 06/26/2017 by Dr. Felipa Mcdaniel which showed reduced ejection fraction at 35-40% and grade 2 diastolic dysfunction.  Compared to a 2016 echo.  There was now significant development of systolic dysfunction with global hypokinesis.  She was initially seen by Shelly Mcdaniel. At that time, I was introduced to the patient due to concerns that the patient most likely will need a right and left heart cardiac catheterization.  She subsequently underwent laboratory which revealed a serum  creatinine of 0.73.  Hemoglobin A1c was increased at 7.8.  Lipid studies in September 2018 showed an LDL cholesterol at 80 with a total cholesterol 145, triglycerides 125, and HDL 42.  Shelly shortness of breath has improved with low-dose Lasix.  She is on Plavix 75 mg daily.  Apparently she is on simvastatin 40 mg a not Crestor.  With Shelly LV dysfunction.  She was started on lisinopril 5 mg daily.  She is diabetic on insulin and metformin.    I saw Shelly for follow-up evaluation on July 26, 2017 at which time she was feeling better with furosemide and low-dose lisinopril.  She underwent right and left heart cardiac catheterization on August 10, 2017 by me which showed severe cardiomyopathy with diffuse global hypocontractility and an EF of 20-25%.  There was mild multivessel CAD with 40% proximal diagonal stenosis, 40% LAD stenosis after the diagonal, 40% circumflex stenosis, and 50-60% mid RCA stenoses with 85% focal stenosis in a small caliber RV marginal branch.  She has significant right heart pressure elevation with pulmonary hypertension with a mean PA pressure at 48-50 mm.  Allowing Shelly catheterization, I recommended discontinuing lisinopril and 36 hours later she was started on samples of Entresto 24/26 mg twice daily.  I also increase carvedilol to 6.25 mg twice daily.    When I saw Shelly, in March 2019, she was doing well.  At that time titrated Entresto to 49/51 mg twice a day.  BNP level  on Oct 18, 2017 was 1700.  Shelly blood pressure has been  elevated at home and on one occasion had increased to 210/110.  Had transient development of nausea which improved with Phenergan.  She also experienced some vertigo for several weeks and had some nystagmus.  She is scheduled to have a neurology follow-up evaluation. When  last saw Shelly on Oct 12, 2017 she was inadvertently taking Entresto only once a day instead of twice a day.  I recommended she take 49/51 mg twice a day.  On this therapy she has felt improved.   She denies any recurrent shortness of breath.  A follow-up proBNP level 2 days ago was markedly improved at 233.  He had lab work days ago which according to Shelly husband was nonfasting.  However, Shelly glucose was markedly elevated at 548.  BUN 15 creatinine 0.92.  Anion gap was normal.  She has not been monitoring Shelly blood glucose on a daily basis.  She has had some issues with nausea and vomiting.  She denies chest pain.  She presents for evaluation.  Past Medical History:  Diagnosis Date  . Diabetes mellitus without complication ([place])   . Hyperlipidemia   . Hypertension   . Stroke ([place])   . UTI (urinary tract infection)     Past Surgical History:  Procedure Laterality Date  . BACK SURGERY    . BREAST SURGERY    . COLONOSCOPY WITH PROPOFOL N/A 12/24/2013   Procedure: COLONOSCOPY WITH PROPOFOL;  Surgeon: [name], MD;  Location: [facility];  Service: Endoscopy;  Laterality: N/A;  . RIGHT/LEFT HEART CATH AND CORONARY ANGIOGRAPHY N/A 08/10/2017   Procedure: RIGHT/LEFT HEART CATH AND CORONARY ANGIOGRAPHY;  Surgeon: Troy Sine, MD;  Location: [facility];  Service: Cardiovascular;  Laterality: N/A;    Current Medications: Outpatient Medications Prior to Visit  Medication Sig Dispense Refill  . acetaminophen (TYLENOL) 500 MG tablet Take 1,000 mg by mouth every 6 (six) hours as needed for moderate pain.    Marland Kitchen albuterol (PROVENTIL HFA;VENTOLIN HFA) 108 (90 Base) MCG/ACT inhaler Inhale 1-2 puffs into the lungs every 6 (six) hours as needed for wheezing or shortness of breath. 1 Inhaler 0  . baclofen (LIORESAL) 10 MG tablet Take 1.5 tablets (15 mg total) by mouth 3 (three) times daily. (Patient taking differently: Take 10 mg by mouth at bedtime. ) 120 tablet 1  . benzonatate (TESSALON) 100 MG capsule Take 1 capsule (100 mg total) by mouth every 8 (eight) hours. (Patient taking differently: Take 100 mg by mouth 3 (three) times daily as needed for cough. ) 21 capsule 0  .  carvedilol (COREG) 3.125 MG tablet Take 2 tablets (6.25 mg total) by mouth 2 (two) times daily. 180 tablet 3  . clopidogrel (PLAVIX) 75 MG tablet Take 1 tablet (75 mg total) by mouth daily. (Patient taking differently: Take 75 mg by mouth at bedtime. ) 30 tablet 0  . furosemide (LASIX) 20 MG tablet Take 10 mg by mouth daily.    . hydroxypropyl methylcellulose / hypromellose (ISOPTO TEARS / GONIOVISC) 2.5 % ophthalmic solution Place 1 drop into both eyes 3 (three) times daily as needed for dry eyes.    Marland Kitchen ibuprofen (ADVIL,MOTRIN) 200 MG tablet Take 600 mg by mouth every 6 (six) hours as needed for moderate pain.    Marland Kitchen insulin aspart (NOVOLOG) 100 UNIT/ML injection Inject 4 Units into the skin 2 (two) times daily as needed for high blood sugar. Only if B/S is over 275    . LEVEMIR FLEXTOUCH 100 UNIT/ML Pen Inject  25 Units into the skin every evening. (Patient taking differently: Inject 22 Units into the skin every evening. ) 15 mL 11  . lidocaine (LIDODERM) 5 % Place 3 patches onto the skin daily. Remove & Discard patch within 12 hours or as directed by MD (Patient taking differently: Place 1 patch onto the skin daily as needed. Remove & Discard patch within 12 hours or as directed by MD) 60 patch 0  . Melatonin 5 MG TABS Take 5 mg by mouth at bedtime.    . metFORMIN (GLUCOPHAGE-XR) 500 MG 24 hr tablet Take 1,000 mg by mouth every evening.    . ondansetron (ZOFRAN) 4 MG tablet Take 1 tablet (4 mg total) by mouth every 8 (eight) hours as needed for nausea or vomiting. (Patient taking differently: Take 4 mg by mouth at bedtime. ) 12 tablet 0  . Phenazopyridine HCl (AZO TABS PO) Take 1 tablet by mouth 3 (three) times daily as needed. Bladder pain    . pregabalin (LYRICA) 25 MG capsule Take 50 mg by mouth at bedtime.     Marland Kitchen scopolamine (TRANSDERM-SCOP) 1 MG/3DAYS Place 1 patch (1.5 mg total) onto the skin every 3 (three) days. 10 patch 12  . senna-docusate (SENOKOT-S) 8.6-50 MG tablet Take 1 tablet by mouth at  bedtime.    . sertraline (ZOLOFT) 50 MG tablet Take 50 mg by mouth at bedtime.     . sacubitril-valsartan (ENTRESTO) 49-51 MG Take 1 tablet by mouth 2 (two) times daily. 60 tablet 3  . ezetimibe (ZETIA) 10 MG tablet Take 1 tablet (10 mg total) by mouth daily. 90 tablet 3  . simvastatin (ZOCOR) 40 MG tablet Take 1 tablet (40 mg total) by mouth at bedtime. 90 tablet 3   No facility-administered medications prior to visit.      Allergies:   Bee venom; Atorvastatin; Sulfamethoxazole-trimethoprim; and Codeine   Social History   Socioeconomic History  . Marital status: Married    Spouse name: Not on file  . Number of children: Not on file  . Years of education: Not on file  . Highest education level: Not on file  Occupational History  . Not on file  Social Needs  . Financial resource strain: Not on file  . Food insecurity:    Worry: Not on file    Inability: Not on file  . Transportation needs:    Medical: Not on file    Non-medical: Not on file  Tobacco Use  . Smoking status: Never Smoker  . Smokeless tobacco: Never Used  Substance and Sexual Activity  . Alcohol use: No    Alcohol/week: 0.0 oz  . Drug use: No  . Sexual activity: Not on file  Lifestyle  . Physical activity:    Days per week: Not on file    Minutes per session: Not on file  . Stress: Not on file  Relationships  . Social connections:    Talks on phone: Not on file    Gets together: Not on file    Attends religious service: Not on file    Active member of club or organization: Not on file    Attends meetings of clubs or organizations: Not on file    Relationship status: Not on file  Other Topics Concern  . Not on file  Social History Narrative  . Not on file     Family History:  The patient's family history includes CAD in Shelly brother and father; Dementia in Shelly mother; Diabetes in Shelly  brother, father, other, and sister; Heart disease in Shelly other; Hyperlipidemia in Shelly other; Hypertension in Shelly other;  Kidney failure in Shelly brother; Stroke in Shelly brother, father, mother, and other.   ROS General: Negative; No fevers, chills, or night sweats;  HEENT: Negative; No changes in vision or hearing, sinus congestion, difficulty swallowing Pulmonary: Negative; No cough, wheezing, shortness of breath, hemoptysis Cardiovascular: See history of present illness GI: Rare episodes of nausea and vomiting. GU: Negative; No dysuria, hematuria, or difficulty voiding Musculoskeletal: Negative; no myalgias, joint pain, or weakness Hematologic/Oncology: Negative; no easy bruising, bleeding Endocrine: Positive for diabetes mellitus Neuro: Positive for initial CVA August 2018 with stroke extension October 2018 associated with orthostatic hypotension.  Neuropathy. Skin: Negative; No rashes or skin lesions Psychiatric: Negative; No behavioral problems, depression Sleep: Negative; No snoring, daytime sleepiness, hypersomnolence, bruxism, restless legs, hypnogognic hallucinations, no cataplexy Other comprehensive 14 point system review is negative.   PHYSICAL EXAM:   VS:  BP (!) 148/94   Pulse 82   Ht 5' 8.5" (1.74 m)   Wt 167 lb (75.8 kg)   BMI 25.02 kg/m     Repeat blood pressure by me was 140/88  Wt Readings from Last 3 Encounters:  12/07/17 167 lb (75.8 kg)  10/12/17 170 lb 12.8 oz (77.5 kg)  10/12/17 168 lb (76.2 kg)    General: Alert, oriented, no distress.  Skin: normal turgor, no rashes, warm and dry HEENT: Normocephalic, atraumatic. Pupils equal round and reactive to light; sclera anicteric; extraocular muscles intact; Nose without nasal septal hypertrophy Mouth/Parynx benign; Mallinpatti scale 3 Neck: No JVD, no carotid bruits; normal carotid upstroke Lungs: clear to ausculatation and percussion; no wheezing or rales Chest wall: without tenderness to palpitation Heart: PMI not displaced, RRR, s1 s2 normal, 1/6 systolic murmur, no diastolic murmur, no rubs, gallops, thrills, or  heaves Abdomen: soft, nontender; no hepatosplenomehaly, BS+; abdominal aorta nontender and not dilated by palpation. Back: no CVA tenderness Pulses 2+ Musculoskeletal: full range of motion, normal strength, no joint deformities Extremities: no clubbing cyanosis or edema, Homan's sign negative  Neurologic: Residual left hemiparesis and left foot drop  Psychologic: Normal mood and affect    Studies/Labs Reviewed:   EKG:  EKG is ordered today. ECG (independently read by me): Normal sinus rhythm at 82 bpm.  QTc interval 46 ms.  Nonspecific ST changes.  No ectopy  10/12/2017 ECG (independently read by me): Normal sinus rhythm at 76 bpm.  QTc intervals 4 9 7  ms.  PR interval 1 4 4  ms.  No ectopy.  August 24, 2017 ECG (independently read by me): normal sinus rhythm at 75 bpm.  Nonspecific ST T changes. QT interval 446; QTc interval 498 ms.   07/26/2017 ECG (independently read by me): Normal sinus rhythm at 70 bpm.  Possible left atrial enlargement.  QS complex V1 through V3  Recent Labs: BMP Latest Ref Rng & Units 12/05/2017 08/24/2017 08/09/2017  Glucose 65 - 99 mg/dL 548(HH) 347(H) 138(H)  BUN 8 - 27 mg/dL 15 25 16   Creatinine 0.57 - 1.00 mg/dL 0.92 0.87 0.67  BUN/Creat Ratio 12 - 28 16 29(H) 24  Sodium 134 - 144 mmol/L 136 140 143  Potassium 3.5 - 5.2 mmol/L 4.2 4.3 4.0  Chloride 96 - 106 mmol/L 98 102 106  CO2 20 - 29 mmol/L 23 25 25   Calcium 8.7 - 10.3 mg/dL 8.9 9.0 9.2     Hepatic Function Latest Ref Rng & Units 12/05/2017 08/09/2017 01/11/2017  Total Protein  6.0 - 8.5 g/dL 6.2 6.7 6.8  Albumin 3.6 - 4.8 g/dL 4.0 3.9 3.9  AST 0 - 40 IU/L 13 10 17   ALT 0 - 32 IU/L 15 10 13(L)  Alk Phosphatase 39 - 117 IU/L 100 108 62  Total Bilirubin 0.0 - 1.2 mg/dL 0.4 0.3 0.5    CBC Latest Ref Rng & Units 12/05/2017 08/24/2017 08/09/2017  WBC 3.4 - 10.8 x10E3/uL 4.6 5.3 5.0  Hemoglobin 11.1 - 15.9 g/dL 12.1 12.7 11.2  Hematocrit 34.0 - 46.6 % 38.0 39.2 34.0  Platelets 150 - 450 x10E3/uL 207 202 171    Lab Results  Component Value Date   MCV 91 12/05/2017   MCV 88 08/24/2017   MCV 86 08/09/2017   Lab Results  Component Value Date   TSH 2.560 08/09/2017   Lab Results  Component Value Date   HGBA1C 6.7 (H) 04/05/2016     BNP No results found for: BNP  ProBNP    Component Value Date/Time   PROBNP 233 12/05/2017 1413     Lipid Panel     Component Value Date/Time   CHOL 133 08/09/2017 0856   TRIG 121 08/09/2017 0856   HDL 33 (L) 08/09/2017 0856   CHOLHDL 4.0 08/09/2017 0856   CHOLHDL 5.7 04/07/2016 0317   VLDL 25 04/07/2016 0317   LDLCALC 76 08/09/2017 0856     RADIOLOGY: No results found.   Additional studies/ records that were reviewed today include:  I reviewed the patient's prior records, neurologic evaluation and evaluation from last week in addition to laboratory; cardiac catheterization findings  ASSESSMENT:    1. Cardiomyopathy as manifestation of underlying disease ([place])   2. Chronic combined systolic and diastolic heart failure ([place])   3. Essential hypertension   4. Coronary artery disease involving native coronary artery of native heart without angina pectoris   5. Type 2 diabetes mellitus with diabetic neuropathy, with long-term current use of insulin ([place])   6. Medication management   7. Other hyperlipidemia   8. 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  She suffered an initial stroke in August 2017 and 2 months later had stroke extension resulting in residual left hemiparesis, and involvement of foot drop.  Recently, she developed symptoms suggestive of congestive heart failure and on echo Doppler was found to have systolic and diastolic dysfunction with an EF of 35-40% and grade 2 diastolic impairment.  There was  mitral annular calcification with mild MR.  She had mild left atrial dilatation.  There was diffuse hypocontractility.  She has been asymptomatic with any chest pain.  Cardiac catheterization demonstrated a severe cardiomyopathy with an EF estimated at 20 to 25%.   There was mild CAD with 40% diagonal, 40% mid LAD, 40% circumflex, 50-60% mid RCA, 85% stenosis in a small caliber RV marginal branch.  PA pressure was 82-51 mm systolically.  She was taken off lisinopril and started on Entresto and when last seen Shelly dose was titrated to 49/51 mg twice a day.  Shelly symptoms significantly improved with Entresto but when I last saw Shelly she was inadvertently taking this only once a day.  For the past 7 weeks she has been taking this in a twice a day regimen.  Shelly proBNP significantly reduced from 1700 down to 233.  Blood pressure today  is stable.  I have suggested further titration of Entresto up to its maximum dose of 97/103 mg twice a day.  I am reducing Shelly Lasix to 10 mg.  Tickly with prior stroke she needs to avoid cerebral hypoperfusion.  We will recheck 3 profile lipids and a proBNP level in 1 month.  I reviewed Shelly most recent laboratory tickly with reference to Shelly markedly elevated glucose.  She has not been checking Shelly sugar regularly.  I suggested she arrange a follow-up visit with Dr. Felipa Mcdaniel and also see Dr. Buddy Duty for endocrinologic assessment.  She continues to take Zetia 10 mg plus simvastatin 40 mg with target LDL less than 70 particularly with Shelly CAD and diabetes mellitus.  She will be seeing Dr. Leonie Man for neurologic follow-up evaluation in August.  I will see Shelly in 3 to 4 months for cardiology evaluation and prior to that office visit on maximum dose Entresto I will recheck an echo Doppler study to see if there has been any improvement in LV function.  Medication Adjustments/Labs and Tests Ordered: Current medicines are reviewed at length with the patient today.  Concerns regarding medicines are  outlined above.  Medication changes, Labs and Tests ordered today are listed in the Patient Instructions below. Patient Instructions  Medication Instructions:  INCREASE Entresto to 97/103 mg (1 tablet) two times daily DECREASE furosemide (Lasix) to 10 mg daily (1/2 tablet)  Labwork: Please return for FASTING labs in 4-6 weeks (CMET, Lipid, ProBNP)  Our in office lab hours are Monday-Friday 8:00-4:00, closed for lunch 12:45-1:45 pm.  No appointment needed.  Testing/Procedures: Your physician has requested that you have an echocardiogram in 3 MONTHS. Echocardiography is a painless test that uses sound waves to create images of your heart. It provides your doctor with information about the size and shape of your heart and how well your heart's chambers and valves are working. This procedure takes approximately one hour. There are no restrictions for this procedure.  This will be done at our [facility] location:  [place]: 3 months with Dr. Claiborne Billings (After echo)  Any Other Special Instructions Will Be Listed Below (If Applicable).     If you need a refill on your cardiac medications before your next appointment, please call your pharmacy.      Signed, Shelva Majestic, MD  12/09/2017 12:22 PM    [facility] [address], [place], [place], [place]  [address] Phone: [phone]

## 2017-12-07 NOTE — Patient Instructions (Addendum)
Medication Instructions:  INCREASE Entresto to 97/103 mg (1 tablet) two times daily DECREASE furosemide (Lasix) to 10 mg daily (1/2 tablet)  Labwork: Please return for FASTING labs in 4-6 weeks (CMET, Lipid, ProBNP)  Our in office lab hours are Monday-Friday 8:00-4:00, closed for lunch 12:45-1:45 pm.  No appointment needed.  Testing/Procedures: Your physician has requested that you have an echocardiogram in 3 MONTHS. Echocardiography is a painless test that uses sound waves to create images of your heart. It provides your doctor with information about the size and shape of your heart and how well your heart's chambers and valves are working. This procedure takes approximately one hour. There are no restrictions for this procedure.  This [facility] be done at [facility] location:  [employer]  Follow-Up: 3 months with Dr. Tresa EndoKelly (After echo)  Any Other Special Instructions Will Be Listed Below (If Applicable).     If you need a refill on your cardiac medications before your next appointment, please call your pharmacy.

## 2017-12-09 ENCOUNTER — Encounter: Payer: Self-pay | Admitting: Cardiovascular Disease

## 2017-12-12 ENCOUNTER — Ambulatory Visit: Payer: BC Managed Care – PPO | Admitting: Physical Medicine & Rehabilitation

## 2017-12-15 ENCOUNTER — Telehealth: Payer: Self-pay | Admitting: *Deleted

## 2017-12-15 NOTE — Telephone Encounter (Signed)
[facility]   [facility] the care management team in patients area is currently full.

## 2017-12-18 ENCOUNTER — Other Ambulatory Visit: Payer: Self-pay

## 2017-12-18 ENCOUNTER — Encounter: Payer: Self-pay | Admitting: Physical Medicine & Rehabilitation

## 2017-12-18 ENCOUNTER — Ambulatory Visit (HOSPITAL_BASED_OUTPATIENT_CLINIC_OR_DEPARTMENT_OTHER): Payer: BC Managed Care – PPO | Admitting: Physical Medicine & Rehabilitation

## 2017-12-18 VITALS — BP 109/71 | HR 80

## 2017-12-18 DIAGNOSIS — G8114 Spastic hemiplegia affecting left nondominant side: Secondary | ICD-10-CM

## 2017-12-18 DIAGNOSIS — G8110 Spastic hemiplegia affecting unspecified side: Secondary | ICD-10-CM | POA: Diagnosis present

## 2017-12-18 NOTE — Telephone Encounter (Signed)
Follow Up:    He is calling to see if you have sent the [facility]?

## 2017-12-18 NOTE — Telephone Encounter (Signed)
Spoke to husband (ok per DPR) aware order was sent and they are full at this time.  He states he will call company and speak with them

## 2017-12-18 NOTE — Telephone Encounter (Signed)
[email or web address]

## 2017-12-18 NOTE — Progress Notes (Signed)
Subjective:    Patient ID: Shelly Mcdaniel, female    DOB: Nov 09, 1955, 62 y.o.   MRN: [id number]  HPI 62 year old female with history of right pontine infarct onset in 2017.  She has residual left spastic hemiplegia. She is mainly in a wheelchair, limited ambulation.  Wheelchair is in poor repair In addition patient is unable to wear her current resting hand splint it is uncomfortable on her left wrist. Also her left PR AFO is no longer holding her foot in place.  She has had this for about 2 years  6 wk f/u frpom Botox injeciton 10/12/2017 Good results with tone reduction in the right finger flexors.  brachioradialis50 FDS 50 FDP 25 FCR 25 PT 50 Pain Inventory Average Pain 1 Pain Right Now 0 My pain is sharp, burning and aching  In the last 24 hours, has pain interfered with the following? General activity 2 Relation with others 1 Enjoyment of life 3 What TIME of day is your pain at its worst? night Sleep (in general) Good  Pain is worse with: other Pain improves with: heat/ice and injections Relief from Meds: 2  Mobility walk with assistance ability to climb steps?  no do you drive?  no use a wheelchair needs help with transfers  Function disabled: date disabled 11/2017 I need assistance with the following:  feeding, bathing, toileting, meal prep, household duties and shopping  Neuro/Psych weakness  Prior Studies Any changes since last visit?  no  Physicians involved in your care Any changes since last visit?  yes   Family History  Problem Relation Age of Onset  . Stroke Mother   . Dementia Mother   . Diabetes Father   . Stroke Father   . CAD Father   . Diabetes Sister   . Diabetes Brother   . CAD Brother   . Stroke Brother   . Kidney failure Brother   . Hypertension Other   . Hyperlipidemia Other   . Stroke Other   . Heart disease Other   . Diabetes Other    Social History   Socioeconomic History  . Marital status: Married    Spouse name:  Not on file  . Number of children: Not on file  . Years of education: Not on file  . Highest education level: Not on file  Occupational History  . Not on file  Social Needs  . Financial resource strain: Not on file  . Food insecurity:    Worry: Not on file    Inability: Not on file  . Transportation needs:    Medical: Not on file    Non-medical: Not on file  Tobacco Use  . Smoking status: Never Smoker  . Smokeless tobacco: Never Used  Substance and Sexual Activity  . Alcohol use: No    Alcohol/week: 0.0 oz  . Drug use: No  . Sexual activity: Not on file  Lifestyle  . Physical activity:    Days per week: Not on file    Minutes per session: Not on file  . Stress: Not on file  Relationships  . Social connections:    Talks on phone: Not on file    Gets together: Not on file    Attends religious service: Not on file    Active member of club or organization: Not on file    Attends meetings of clubs or organizations: Not on file    Relationship status: Not on file  Other Topics Concern  . Not on file  Social History Narrative  . Not on file   Past Surgical History:  Procedure Laterality Date  . BACK SURGERY    . BREAST SURGERY    . COLONOSCOPY WITH PROPOFOL N/A 12/24/2013   Procedure: COLONOSCOPY WITH PROPOFOL;  Surgeon: Charolett Bumpers, MD;  Location: [facility];  Service: Endoscopy;  Laterality: N/A;  . RIGHT/LEFT HEART CATH AND CORONARY ANGIOGRAPHY N/A 08/10/2017   Procedure: RIGHT/LEFT HEART CATH AND CORONARY ANGIOGRAPHY;  Surgeon: Lennette Bihari, MD;  Location: [facility];  Service: Cardiovascular;  Laterality: N/A;   Past Medical History:  Diagnosis Date  . Diabetes mellitus without complication (HCC)   . Hyperlipidemia   . Hypertension   . Stroke (HCC)   . UTI (urinary tract infection)    BP 109/71   Pulse 80   SpO2 94%   Opioid Risk Score:   Fall Risk Score:  `1  Depression screen PHQ 2/9  Depression screen [facility] 2/9 12/18/2017 10/12/2017  07/14/2016 05/12/2016  Decreased Interest 0 0 0 3  Down, Depressed, Hopeless 0 0 0 0  PHQ - 2 Score 0 0 0 3  Altered sleeping - - - 2  Tired, decreased energy - - - 3  Feeling bad or failure about yourself  - - - 0  Trouble concentrating - - - 3  Moving slowly or fidgety/restless - - - 2  Suicidal thoughts - - - 0  PHQ-9 Score - - - 13  Difficult doing work/chores - - - Very difficult  Some recent data might be hidden    Review of Systems  Constitutional: Positive for unexpected weight change.  HENT: Negative.   Eyes: Negative.   Respiratory: Negative.   Cardiovascular: Negative.        Limb swelling  Gastrointestinal: Positive for constipation, nausea and vomiting.  Endocrine: Negative.   Genitourinary: Negative.   Musculoskeletal: Negative.   Skin: Negative.   Allergic/Immunologic: Negative.   Neurological: Negative.   Hematological: Negative.   Psychiatric/Behavioral: Negative.   All other systems reviewed and are negative.      Objective:   Physical Exam  Constitutional: She is oriented to person, place, and time. She appears well-developed and well-nourished.  HENT:  Head: Normocephalic and atraumatic.  Eyes: Pupils are equal, round, and reactive to light. EOM are normal.  Musculoskeletal: Normal range of motion.  Neurological: She is alert and oriented to person, place, and time.  Psychiatric: She has a normal mood and affect.  Nursing note and vitals reviewed.   Tone Pronator Teres MAS 3 FPL 2 FCR 3 Elbow flexor MAS 3  Trace finger flexor 3- Deltoid 3- Biceps 2- triceps    Assessment & Plan:   #1.  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  Good result with Botox will make following adjustments  brachioradialis50 FDS 50 FDP 50 FCR 50  Have written prescription for new right PR AFO as well as new right resting hand splint  Will make appointment for wheelchair evaluation with Jacalyn Lefevre nurse practitioner at this clinic.

## 2017-12-18 NOTE — Patient Instructions (Signed)
Please call [employer] for Left PRAFO (foot) and Left resting hand splint  Will see Eunice for wheelchair eval next visit  Botox in 1 month

## 2017-12-19 ENCOUNTER — Telehealth: Payer: Self-pay | Admitting: Cardiovascular Disease

## 2017-12-19 NOTE — Telephone Encounter (Signed)
Spoke with patient's husband (DPR). He is calling as he states [facility] and [facility] are not full (as of today) for PT for his wife. He is also wanting to know if Dr. Tresa EndoKelly ordered PT or both PT and OT as Dr. Jodean LimaKirstein's ordered PT today. Husband states [facility] but their OT is "inept" and they want to keep her with PT. Advised would [facility] on order status and husband

## 2017-12-19 NOTE — Telephone Encounter (Signed)
Spoke to husband, states he spoke [facility] and [facility] are willing to assist with PT for patient.     Advised I would refax as requested.   [facility] [phone], [facility]

## 2017-12-19 NOTE — Telephone Encounter (Signed)
Pt's spouse is calling and needing to speak to nurse concerning referral to Physical Therapy. Please call

## 2017-12-20 ENCOUNTER — Other Ambulatory Visit: Payer: Self-pay | Admitting: Cardiology

## 2017-12-20 NOTE — Telephone Encounter (Signed)
This Dr. Kelly's pt. °

## 2017-12-26 ENCOUNTER — Encounter: Payer: BC Managed Care – PPO | Attending: Physical Medicine & Rehabilitation | Admitting: Registered Nurse

## 2017-12-28 ENCOUNTER — Ambulatory Visit: Payer: BC Managed Care – PPO | Admitting: Registered Nurse

## 2018-01-01 ENCOUNTER — Telehealth: Payer: Self-pay

## 2018-01-01 NOTE — Telephone Encounter (Signed)
Husband of pt called to have [employer] script fax[facility] because he left it at home and [facility] was [facility]. It has been faxed. Left message letting husband know.

## 2018-01-06 IMAGING — MR MR HEAD W/O CM
9 of 13 series · 32 of 48 positions shown · non-contrast
Comparison: 04/05/2016 CT head.  11/08/2014 MRI of the brain.

CLINICAL DATA: 60 y/o F; history of stroke with residual left-sided
facial weakness, arm weakness, and leg weakness presenting with
worsening confusion.

EXAM:
MRI HEAD WITHOUT CONTRAST
MRA HEAD WITHOUT CONTRAST
TECHNIQUE: Multiplanar, multiecho pulse sequences of the brain and surrounding
structures were obtained without intravenous contrast. Angiographic
images of the head were obtained using MRA technique without
contrast.

[Series 3: DWI · axial · 3.0mm · 1.09mm/px · z∈[-37,+107]mm · 8 of 99 slices shown (1 of 4)]
[im 1/99]
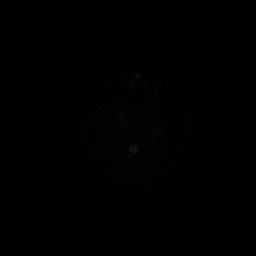
[im 15/99]
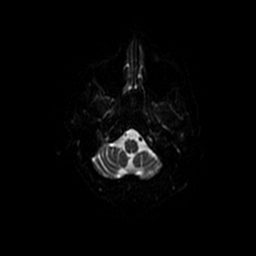
[im 29/99]
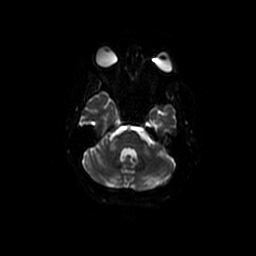
[im 43/99]
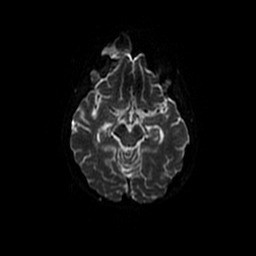
[im 57/99]
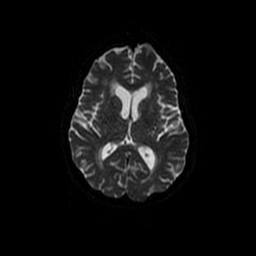
[im 71/99]
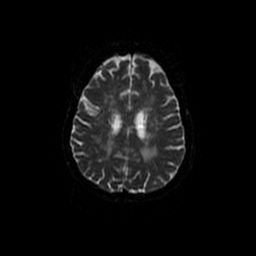
[im 85/99]
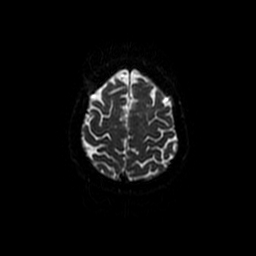
[im 99/99]
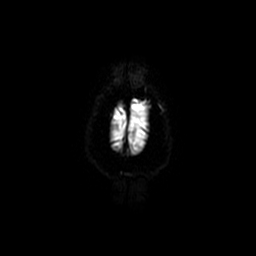

[Series 4: DWI · coronal · 5.0mm · 1.09mm/px · 6 of 70 slices shown (2 of 4)]
[im 1/70]
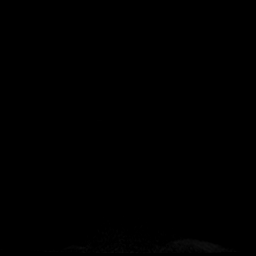
[im 14/70]
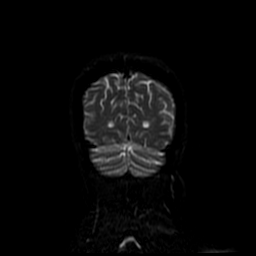
[im 28/70]
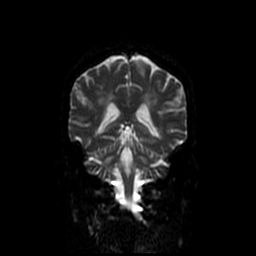
[im 42/70]
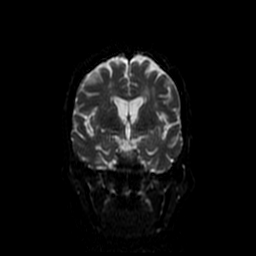
[im 56/70]
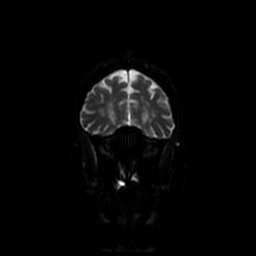
[im 70/70]
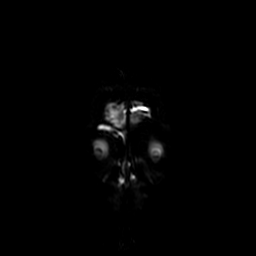

[Series 6: T2 · axial · 5.0mm · 0.47mm/px · z∈[-40,+101]mm · 2 of 25 slices shown (1 of 2)]
[im 1/25]
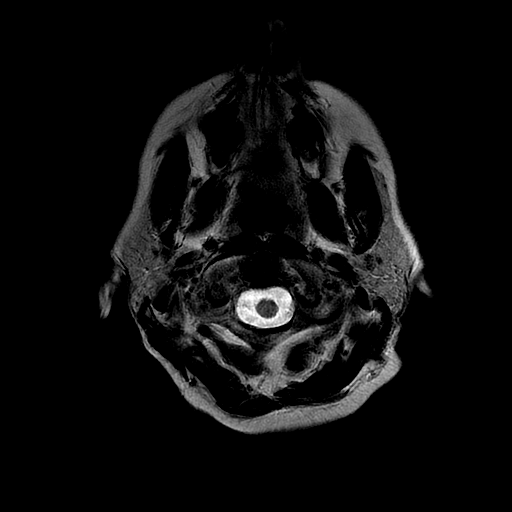
[im 25/25]
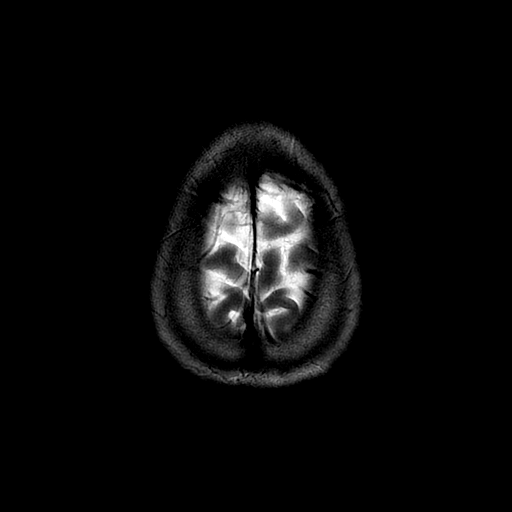

[Series 7: FLAIR · axial · 5.0mm · 0.47mm/px · z∈[-40,+101]mm · 2 of 25 slices shown (1 of 3)]
[im 1/25]
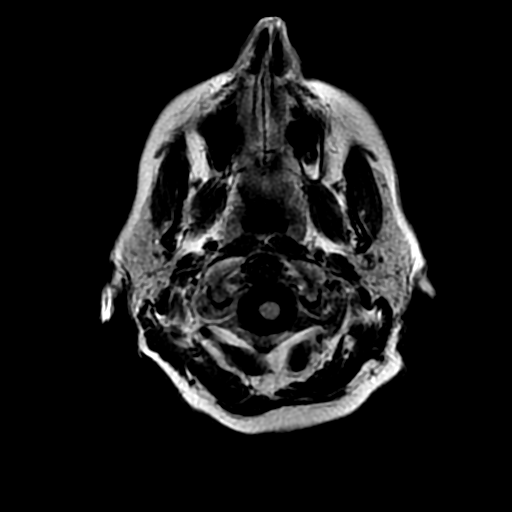
[im 25/25]
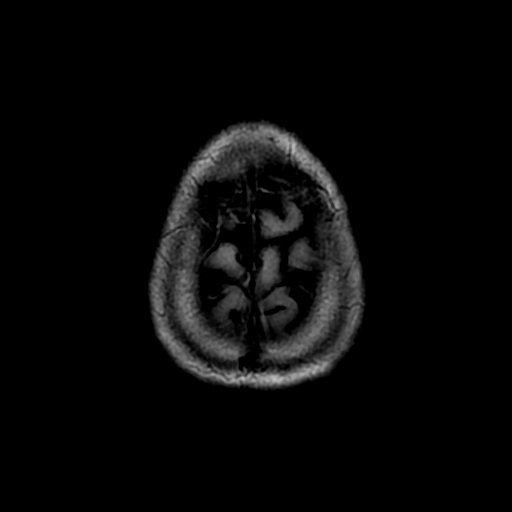

[Series 8: T2 · coronal · 5.0mm · 0.43mm/px · 3 of 30 slices shown (2 of 2)]
[im 1/30]
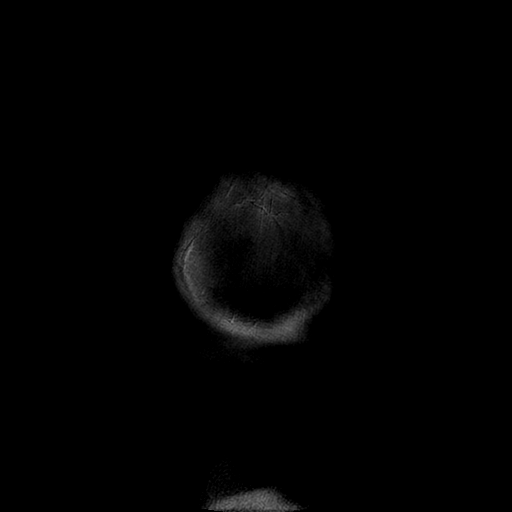
[im 15/30]
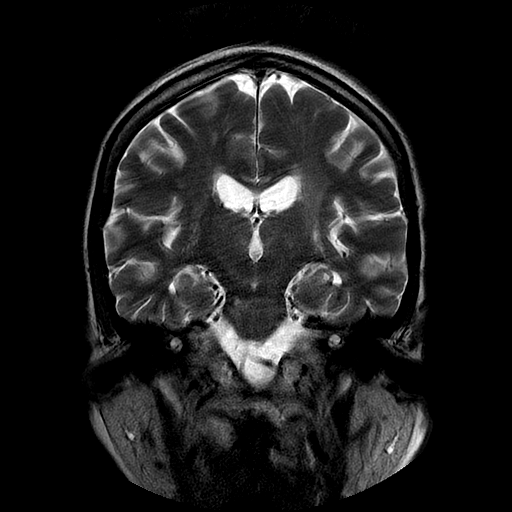
[im 30/30]
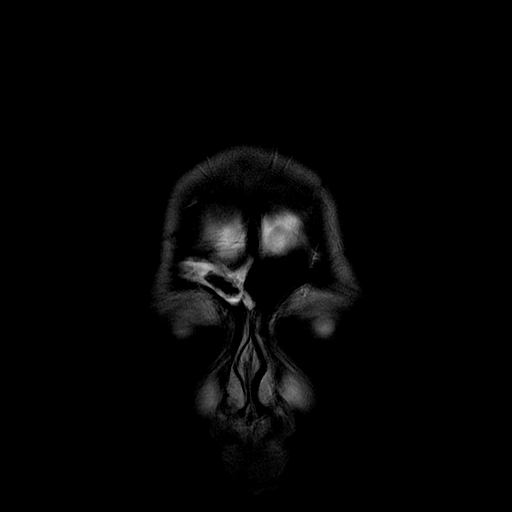

[Series 10: FLAIR · axial · 5.0mm · 0.47mm/px · z∈[-40,+101]mm · 2 of 25 slices shown (2 of 3)]
[im 1/25]
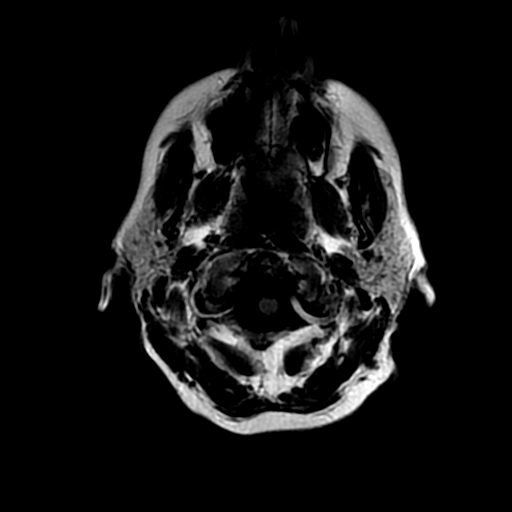
[im 25/25]
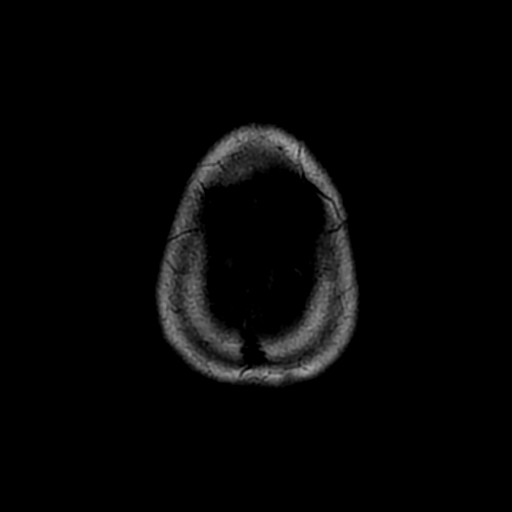

[Series 11: FLAIR · sagittal · 5.0mm · 0.47mm/px · 2 of 21 slices shown (3 of 3)]
[im 1/21]
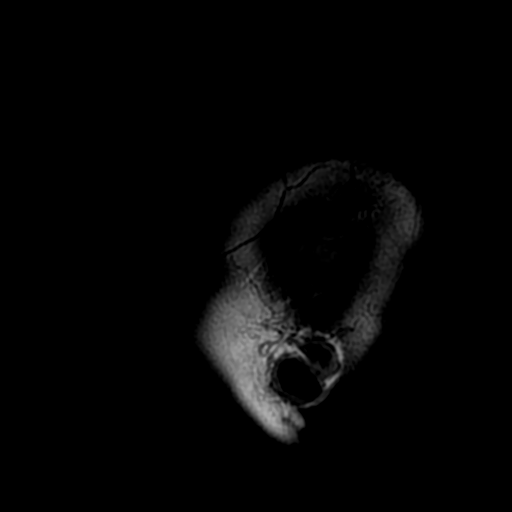
[im 21/21]
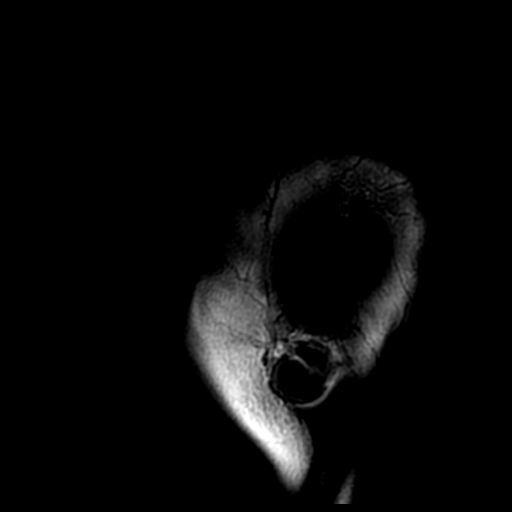

[Series 300: DWI · axial · 3.0mm · 1.09mm/px · z∈[-37,+107]mm · 4 of 50 slices shown (3 of 4)]
[im 1/50]
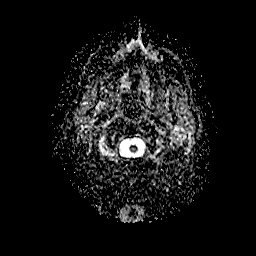
[im 17/50]
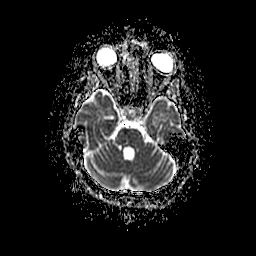
[im 33/50]
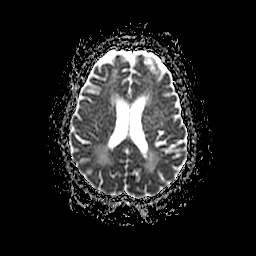
[im 50/50]
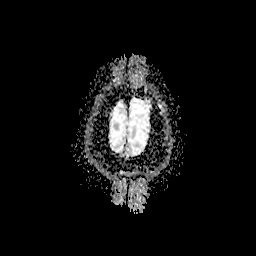

[Series 400: DWI · coronal · 5.0mm · 1.09mm/px · 3 of 35 slices shown (4 of 4)]
[im 1/35]
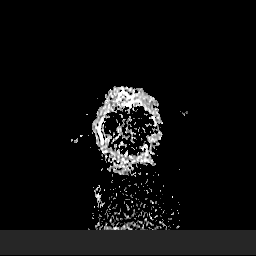
[im 18/35]
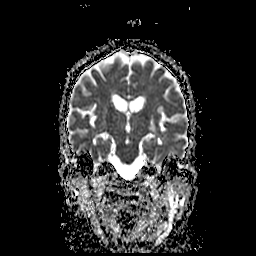
[im 35/35]
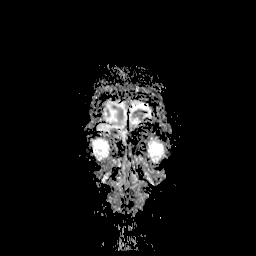

[32 of 48 positions shown; findings below may reference images not displayed]

FINDINGS: MRI HEAD FINDINGS

Brain: There are foci of diffusion restriction within the right
hemipons and within the bilateral middle cerebral peduncles. Areas
of diffusion restriction are associated mild T2 FLAIR
hyperintensity.

There is a background of nonspecific foci of T2 FLAIR hyperintensity
in subcortical and periventricular white matter with frontal
parietal predominance compatible with moderate chronic microvascular
ischemic changes. There is a chronic lacunar infarct within the left
lentiform nucleus extending into the corona radiata. No abnormal
susceptibility hypointensity to indicate intracranial hemorrhage.
Ventricle size is normal. No extra-axial collection.

Vascular: See below.

Skull and upper cervical spine: Normal marrow signal.

Sinuses/Orbits: There is mucosal thickening within the anterior
ethmoid air cells and the right frontal sinus. There is
opacification of the right mastoid tip. Orbits are unremarkable.

Other: None.

MRA HEAD FINDINGS

Anterior circulation: Irregularity of the cavernous and paraclinoid
segments of internal carotid arteries bilaterally with mild
stenosis. Bilateral MCA and ACA are patent without high-grade
stenosis, aneurysm, or occlusion.

Posterior circulation: Unchanged diminutive right vertebral artery
and left dominant vertebrobasilar system. Irregularity of the
basilar artery with moderate to severe short segment of distal
stenosis that is worsened in comparison with the prior MRI.
Bilateral posterior cerebral arteries are patent without high-grade
stenosis, aneurysm, or occlusion.

Anatomic variant: Left fetal posterior cerebral artery with
diminutive P1 segment. No right posterior communicating artery
identified, likely hypoplastic or absent. Probable diminutive
anterior communicating artery
IMPRESSION: 1. Foci of diffusion restriction within the right hemipons and
abnormal signal in the middle cerebellar peduncles probably
represent areas of acute/early subacute ischemia. No hemorrhage
identified.
2. Increasing stenosis of the distal basilar artery, now
moderate-to-severe.
3. Otherwise the circle of Willis is patent with atherosclerotic
changes and short segments of mild stenosis in the anterior and
posterior circulation without large vessel occlusion or aneurysm.
4. Paranasal sinus disease predominantly in anterior ethmoid and
right frontal sinuses.
5. Stable moderate chronic microvascular ischemic changes of the
brain parenchyma.
These results were called by telephone at the time of interpretation
on 04/05/2016 at [DATE] to Dr. Angie Lizeth, who verbally acknowledged
these results.

By: Margarette Ervin M.D.

## 2018-01-08 ENCOUNTER — Ambulatory Visit: Payer: BC Managed Care – PPO | Admitting: Neurology

## 2018-01-23 ENCOUNTER — Encounter: Payer: Self-pay | Admitting: Neurology

## 2018-01-23 ENCOUNTER — Ambulatory Visit (INDEPENDENT_AMBULATORY_CARE_PROVIDER_SITE_OTHER): Payer: BC Managed Care – PPO | Admitting: Neurology

## 2018-01-23 ENCOUNTER — Telehealth: Payer: Self-pay | Admitting: Cardiovascular Disease

## 2018-01-23 VITALS — BP 137/88 | HR 87 | Wt 165.0 lb

## 2018-01-23 DIAGNOSIS — G8110 Spastic hemiplegia affecting unspecified side: Secondary | ICD-10-CM | POA: Diagnosis not present

## 2018-01-23 NOTE — Patient Instructions (Addendum)
I had a long d/w patient and her husband about her remote stroke and spastic hemiplegia, risk for recurrent stroke/TIAs, personally independently reviewed imaging studies and stroke evaluation results and answered questions.Continue Plavix 75 mg daily for secondary stroke prevention and maintain strict control of hypertension with blood pressure goal below 130/90, diabetes with hemoglobin A1c goal below 6.5% and lipids with LDL cholesterol goal below 70 mg/dL. I also advised the patient to eat a healthy diet with plenty of whole grains, cereals, fruits and vegetables, exercise regularly and maintain ideal body weight she was advised to use a cane and 1 person assistance at all times while walking. We also discussed fall and safety prevention precautions. Check follow-up CT angiogram of the brain to follow her known basilar artery stenosis Followup in the future with me in one year or call earlier if necessary  Fall Prevention in the Home Falls can cause injuries. They can happen to people of all ages. There are many things you can do to make your home safe and to help prevent falls. What can I do on the outside of my home?  Regularly fix the edges of walkways and driveways and fix any cracks.  Remove anything that might make you trip as you walk through a door, such as a raised step or threshold.  Trim any bushes or trees on the path to your home.  Use bright outdoor lighting.  Clear any walking paths of anything that might make someone trip, such as rocks or tools.  Regularly check to see if handrails are loose or broken. Make sure that both sides of any steps have handrails.  Any raised decks and porches should have guardrails on the edges.  Have any leaves, snow, or ice cleared regularly.  Use sand or salt on walking paths during winter.  Clean up any spills in your garage right away. This includes oil or grease spills. What can I do in the bathroom?  Use night lights.  Install grab  bars by the toilet and in the tub and shower. Do not use towel bars as grab bars.  Use non-skid mats or decals in the tub or shower.  If you need to sit down in the shower, use a plastic, non-slip stool.  Keep the floor dry. Clean up any water that spills on the floor as soon as it happens.  Remove soap buildup in the tub or shower regularly.  Attach bath mats securely with double-sided non-slip rug tape.  Do not have throw rugs and other things on the floor that can make you trip. What can I do in the bedroom?  Use night lights.  Make sure that you have a light by your bed that is easy to reach.  Do not use any sheets or blankets that are too big for your bed. They should not hang down onto the floor.  Have a firm chair that has side arms. You can use this for support while you get dressed.  Do not have throw rugs and other things on the floor that can make you trip. What can I do in the kitchen?  Clean up any spills right away.  Avoid walking on wet floors.  Keep items that you use a lot in easy-to-reach places.  If you need to reach something above you, use a strong step stool that has a grab bar.  Keep electrical cords out of the way.  Do not use floor polish or wax that makes floors slippery. If you  must use wax, use non-skid floor wax.  Do not have throw rugs and other things on the floor that can make you trip. What can I do with my stairs?  Do not leave any items on the stairs.  Make sure that there are handrails on both sides of the stairs and use them. Fix handrails that are broken or loose. Make sure that handrails are as long as the stairways.  Check any carpeting to make sure that it is firmly attached to the stairs. Fix any carpet that is loose or worn.  Avoid having throw rugs at the top or bottom of the stairs. If you do have throw rugs, attach them to the floor with carpet tape.  Make sure that you have a light switch at the top of the stairs and the  bottom of the stairs. If you do not have them, ask someone to add them for you. What else can I do to help prevent falls?  Wear shoes that: ? Do not have high heels. ? Have rubber bottoms. ? Are comfortable and fit you well. ? Are closed at the toe. Do not wear sandals.  If you use a stepladder: ? Make sure that it is fully opened. Do not climb a closed stepladder. ? Make sure that both sides of the stepladder are locked into place. ? Ask someone to hold it for you, if possible.  Clearly mark and make sure that you can see: ? Any grab bars or handrails. ? First and last steps. ? Where the edge of each step is.  Use tools that help you move around (mobility aids) if they are needed. These include: ? Canes. ? Walkers. ? Scooters. ? Crutches.  Turn on the lights when you go into a dark area. Replace any light bulbs as soon as they burn out.  Set up your furniture so you have a clear path. Avoid moving your furniture around.  If any of your floors are uneven, fix them.  If there are any pets around you, be aware of where they are.  Review your medicines with your doctor. Some medicines can make you feel dizzy. This can increase your chance of falling. Ask your doctor what other things that you can do to help prevent falls. This information is not intended to replace advice given to you by your health care provider. Make sure you discuss any questions you have with your health care provider. Document Released: 03/26/2009 Document Revised: 11/05/2015 Document Reviewed: 07/04/2014 Elsevier Interactive Patient Education  [employer]2018 [employer]

## 2018-01-23 NOTE — Telephone Encounter (Signed)
Request for verbal order to continue home health physical therapy [facility] for [facility].

## 2018-01-23 NOTE — Telephone Encounter (Signed)
LMOM on [facility]    [facility]  Lennette BihariKelly, Thomas A, MD        01/23/18 3:45 PM  Note    Okay to continue home health physical therapy

## 2018-01-23 NOTE — Telephone Encounter (Signed)
New message  Per Maurine MinisterDennis is requesting a verbal order to continue with Home Health Physical Therapy. It will be 2 times a week for 4 more weeks for the patient. Please advise.  Leave a voice mail if not available.

## 2018-01-23 NOTE — Telephone Encounter (Signed)
Okay to continue home health physical therapy

## 2018-01-23 NOTE — Progress Notes (Signed)
STROKE NEUROLOGY FOLLOW UP NOTE  NAME: Shelly SaucierJody H Mcdaniel DOB: [birth year]  REASON FOR VISIT: stroke follow up HISTORY FROM: husband and pt and chart  Today we had the pleasure of seeing Shelly Mcdaniel in follow-up at our Neurology Clinic. Pt was accompanied by husband.   History Summary Shelly Mcdaniel is a 62 y.o. 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 she had right pontine stroke in 01/2016 and was treated in [facility]. TEE and CUS unremarkable and she was discharged with ASA and zocor. She continued to have left hemiparesis. After admission, stroke work up showed again right pontine and b/l cerebellar peduncle infarcts. 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 EEG generalized slowing and no seizure. LDL 110, A1C 6.7. She was put on DAPT and crestor. She was also found to have orthostatic hypotension. Put on TED hose and BP goal 130-150. She was later discharged to CIR.    05/23/16 follow up - the patient has been doing the same. She is now home. She was initially improving on the left hemiparesis but recently developed continued UTI even with antibiotics. As per son, all her improvement has been disappeared due to UTI. Her BP at home lying about 150-170, sitting 130s and standing would drop to 100s. Today in clinic BP 123/85  07/20/16 follow up - pt has been doing better. She had at least UTI x 2, on cipro and recently referred to urology and pending further work up with CT or ultrasound. Fortunately, no sepsis either time. She continued to work with outpt PT/OT and her left LE much improved on strength, however, LUE still flaccid. Working with PT/OT still has intermittent orthostatic hypotension which sometimes aborted her rehab session. However, as per husband, pt BP at home sitting 130/90, lying 150s and standing lower  than 130 but not remeber exact number. She is on metoprolol 25mg  daily. As per husband, daughter who is anesthesiologist in [facility] [place] would like pt to have low dose lisinopril for her cardiac and renal protection. BP today in clinic sitting 146/93.   08/31/16 follow up - pt has one episode of staring spells on 08/24/16 lasting about 5min, no shaking jerking or b/b incontinence at that time. Did not check BP. She was sent to ED where she was back to her baseline. Pt can not remember the event. CT head no bleeding. UA negative and CBC BMP normal except K 3.2. She was not put on AEDs at that time.  She was recently discharged from PT/OT and followed with Dr. Wynn BankerKirsteins who does not think pt needs ongoing botox or ritalin treatment. Still has left hemiplegia. BP today 160/104, however, stated her BP at home or other doctor's office always 120-140s. Currently at home, no symptoms with sitting or standing. Working on transition and walking with quad cane at home currently. Pt decreased baclofen and zoloft dose and pt seems more awake alert today in clinic. DM and glucose better controlled. Glucose today 108.   01/03/17 follow up - pt has been pretty much the same. Came in with wheelchair, but able to use cane to transition herself. Continued follow up with Dr. Doroteo BradfordKirstein and received botox injection. Has finished PT/OT. Had recent "stomach virus" infection and gave her setback on neuro improvement. She also has left flank pain, no  shingles, following with pain management, on lyrica, concerning for lumbar radiculopathy. BP today 126/87. As per husband, pt continues to take ASA and plavix and zocor at home. Repeat CTA head and neck showed distal BA only 50% stenosis now, will continue to monitor.  07/04/2017 ;  During the interval time, patient neurologically stable.  However, patient was found to have weight gain with fluid overload.  Had TEE showed EF 35-40%, significantly down from previous 60-65%.  Low-dose  amlodipine was added.  Patient is going to follow-up with cardiology for further treatment.  BP today 158/102.  As per family, her orthostatic hypotension much improved.  Graduated home PT/OT, expecting cardio, rehab at this time. Update 01/23/2018  : She returns for follow-up in comparing by husband after last visit with Dr. Roda ShuttersXu in January 2019. She is doing well from the stroke standpoint without recurrent stroke or TIA symptoms. 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 She was admitted in the hospital in January with bronchitis and retention of 30 pounds of fluid and saw cardiologist and has been started on heart failure medication and is doing better. She is able to walk at home using a quad cane but needs 1 person assist. She has had problems controlling her sugars and has started seeing endocrinologist Dr. Sharl MaKerr who plans to increase her insulin. She saw cardiologist Dr. Tresa EndoKelly will check lipid profile which was satisfactory. She has no new complaints REVIEW OF SYSTEMS: Full 14 system review of systems performed and notable only for those listed below and in HPI above, all others are negative:    Eye itching, eye pain, blurred vision, leg swelling, bladder urgency, muscle cramps, joint pain, walking difficulty, facial drooping, gait difficulty The following represents the patient's updated allergies and side effects list: Allergies  Allergen Reactions  . Bee Venom Anaphylaxis  . Atorvastatin Other (See Comments)    Cramping  Other reaction(s): Other (See Comments) Cramping Other reaction(s): Other (See Comments) Cramping  . Sulfamethoxazole-Trimethoprim Other (See Comments)    Mother died from Stevens-Johnson syndrome and sister had anaphylaxis from Septra.Physician told her never to take this medication Mother died from Stevens-Johnson syndrome and sister had anaphylaxis from Septra.Physician told her  never to take this medication. Mother died from Stevens-Johnson syndrome and sister had anaphylaxis from Septra.Physician told her never to take this medication.  . Codeine Itching    The neurologically relevant items on the patient's problem list were reviewed on today's visit.  Neurologic Examination  A problem focused neurological exam (12 or more points of the single system neurologic examination, vital signs counts as 1 point, cranial nerves count for 8 points) was performed.  Blood pressure 137/88, pulse 87, weight 165 lb (74.8 kg).  General - Well nourished, well developed, in no apparent distress.  Ophthalmologic - Fundi not visualized due to eye movement.  Cardiovascular - Regular rate and rhythm.  Mental Status -  Level of arousal and orientation to time, place and person were intact. Language exam showed mild wording finding difficulty and slow speech, mild dysarthria, but intact naming, repetition, comprehension.  Cranial Nerves II - XII - II - Visual field intact OU. III, IV, VI - Extraocular movements intact. V - Facial sensation intact bilaterally. VII - left facial weaknessVIII - Hearing & vestibular intact bilaterally. X - Palate elevates symmetrically, mild dysarthria. XI - Chin turning & shoulder shrug intact bilaterally. XII - Tongue protrusion intact.  Motor Strength - The patient's strength was 5/5 RUE and RLE, but 0/5 LUE and 4/5 LLE proximal but 2/5 DF and 3/5 PF with left  Foot drop. Bulk was normal and fasciculations were absent.   Motor Tone - Muscle tone was assessed at the neck and appendages and was mildly increased LUE.  Reflexes - The patient's reflexes were 1+ in all extremities and she had no pathological reflexes.  Sensory - Light touch, temperature/pinprick were assessed and were symmetrical.    Coordination - The patient had normal movements in the right hand with no ataxia or dysmetria.  Tremor was absent.  Gait and Station - in  wheelchair, not tested.    Data reviewed: I personally reviewed the images and agree with the radiology interpretations.  Dg Chest 2 View 04/05/2016 No radiographic evidence of acute cardiopulmonary disease.   Ct Head Wo Contrast 04/05/2016 1. No acute intracranial findings.  2. 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   Mr Brain 26 Contrast Mr Maxine Glenn Head Wo Contrast 04/05/2016 1. Foci of diffusion restriction within the right hemipons and abnormal signal in the middle cerebellar peduncles probably represent areas of acute/early subacute ischemia. No hemorrhage identified.  2. 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  3. Otherwise the circle of Willis is patent with atherosclerotic changes and short segments of mild stenosis in the anterior and posterior circulation without large vessel occlusion or aneurysm.  4. Paranasal sinus disease predominantly in anterior ethmoid and right frontal sinuses.  5. Stable moderate chronic microvascular ischemic changes of the brain parenchyma.   EEG This is an abnormal EEG due to the presence of mild diffuse generalized slowing with greater intermixed bifrontal slowing. This pattern is nonspecific and consistent with a global encephalopathic process, nonspecific as to etiology. There is no evidence of seizure on the study.  CUS 01/17/2016 - bilateral plaques, but no hemodynamically significant stenosis  TTE - 01/17/16 - mild diastolic dysfunction with borderline left ventricular hypertrophy with a trace amount of mital regurgitation, trace amount of aortic regurgitation, trace amount of tricuspid regurgitation.  MRI brain 01/17/16 - atrophy and chronic micro-vascular ischemic changes. There is restricted diffusion on the right side of the pons consistent with an acute infarct.   MRI 05/13/16 1. Limited motion degraded brain MRI consisting of diffusion and FLAIR sequences. 2. No new abnormality compared to  04/05/2016. 3. Diffusion hyperintensity from the recent right pontine infarct has resolved as expected, but symmetric hyperintensity persists in the middle cerebellar peduncles. Favor acute Wallerian degeneration which can have this pattern with pontine infarct. Toxic/metabolic derangement, usually described with liver disease or hypoglycemia, or hypertensive complication are alternate considerations.  CT head 08/24/16 -  Old right brainstem infarct. No acute intracranial abnormality. Chronic microvascular disease.  CTA head and neck 09/07/16 Stable vascular evaluation compared to previous studies. Right carotid bifurcation region atherosclerosis but no stenosis. 33% stenosis of the left common carotid artery proximal to the bifurcation. No left carotid bifurcation disease. 33% stenoses in both carotid siphon regions. Right vertebral artery terminates in PICA. 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  TTE 06/26/17  - Left ventricle: The cavity size was normal. There was mild   concentric hypertrophy. Systolic function was moderately reduced.   The estimated ejection fraction was in the range of 35% to 40%.   Moderate diffuse hypokinesis with no identifiable regional   variations. Features are consistent with a pseudonormal left  ventricular filling pattern, with concomitant abnormal relaxation   and increased filling pressure (grade 2 diastolic dysfunction). - Mitral valve: Mild focal calcification of the anterior leaflet,   limited to the leaflet margin. There was mild regurgitation. - Left atrium: The atrium was mildly dilated. Impressions: - Compared to 2016, there is marked worsening of left ventricular   systolic function, with a global pattern.   Component     Latest Ref Rng & Units 04/05/2016 04/07/2016  Cholesterol     0 - 200 mg/dL  161  Triglycerides     <150 mg/dL  096  HDL Cholesterol      >40 mg/dL  29 (L)  Total CHOL/HDL Ratio     RATIO  5.7  VLDL     0 - 40 mg/dL  25  LDL (calc)     0 - 99 mg/dL  045 (H)  Hemoglobin W0J     4.8 - 5.6 % 6.7 (H)   Mean Plasma Glucose     mg/dL 811   Ammonia     9 - 35 umol/L 14   Vitamin B12     180 - 914 pg/mL  320  Folate     >5.9 ng/mL  21.7    Assessment:  , she is a 62 y.o. 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   Repeat CTA head and neck showed distal BA 50%, improved from previous MRA.    Plan:  I had a long d/w patient and her husband about her remote stroke and spastic hemiplegia, risk for recurrent stroke/TIAs, personally independently reviewed imaging studies and stroke evaluation results and answered questions.Continue Plavix 75 mg daily for secondary stroke prevention and maintain strict control of hypertension with blood pressure goal below 130/90, diabetes with hemoglobin A1c goal below 6.5% and lipids with LDL cholesterol goal below 70 mg/dL. I also advised the patient to eat a healthy diet with plenty of whole grains, cereals, fruits and vegetables, exercise regularly and maintain ideal body weight she was advised to use a cane and 1 person assistance at all times while walking. We also discussed fall and safety prevention precautions. Check follow-up CT angiogram of the brain to follow her known basilar artery stenosis Followup in the future with me in one year or call earlier if necessary I spent more than 25 minutes of face to face time with the patient. Greater than 50% of time was spent in counseling and coordination of care. We discussed BP management, future neuro follow-up, cardiology appointment and cardiac rehab.   Orders Placed This Encounter  Procedures  .  CT ANGIO HEAD W OR WO CONTRAST    Standing Status:   Future    Standing Expiration Date:   03/26/2019    Order Specific Question:   If indicated for the ordered procedure, I authorize the administration of contrast media per Radiology protocol    Answer:   Yes    Order Specific Question:   Preferred imaging location?    Answer:   [facility]    Order Specific Question:   Radiology Contrast Protocol - do NOT remove file path    Answer:   [email or web address]    No orders of the defined types were  placed in this encounter.  Delia Heady, MD  [facility] [address] [place], [place] [address]  Phone [phone] Fax [phone]

## 2018-01-24 ENCOUNTER — Telehealth: Payer: Self-pay | Admitting: Neurology

## 2018-01-24 NOTE — Telephone Encounter (Signed)
[employer] Auth: [id number] (exp. 01/24/18 to 02/22/18) [facility] 01/30/18. Patient husband is aware of time and day. I also gave him their number of [phone] incase he needed to r/s for any reason.

## 2018-01-25 ENCOUNTER — Ambulatory Visit (HOSPITAL_BASED_OUTPATIENT_CLINIC_OR_DEPARTMENT_OTHER): Payer: BC Managed Care – PPO | Admitting: Physical Medicine & Rehabilitation

## 2018-01-25 ENCOUNTER — Encounter: Payer: Self-pay | Admitting: Physical Medicine & Rehabilitation

## 2018-01-25 VITALS — BP 138/85 | HR 75 | Ht 68.5 in | Wt 165.0 lb

## 2018-01-25 DIAGNOSIS — I6350 Cerebral infarction due to unspecified occlusion or stenosis of unspecified cerebral artery: Secondary | ICD-10-CM

## 2018-01-25 DIAGNOSIS — G8114 Spastic hemiplegia affecting left nondominant side: Secondary | ICD-10-CM

## 2018-01-25 DIAGNOSIS — G8110 Spastic hemiplegia affecting unspecified side: Secondary | ICD-10-CM | POA: Diagnosis present

## 2018-01-25 NOTE — Progress Notes (Signed)
Botox Injection for spasticity using needle EMG guidance  Dilution: 50 Units/ml Indication: Severe spasticity which interferes with ADL,mobility and/or  hygiene and is unresponsive to medication management and other conservative care Informed consent was obtained after describing risks and benefits of the procedure with the patient. This includes bleeding, bruising, infection, excessive weakness, or medication side effects. A REMS form is on file and signed. Needle: 27g 1" needle electrode Number of units per muscle brachioradialis50 FDS 50 FDP 50 FCR 50 All injections were done after obtaining appropriate EMG activity and after negative drawback for blood. The patient tolerated the procedure well. Post procedure instructions were given. A followup appointment was made.

## 2018-01-25 NOTE — Patient Instructions (Signed)

## 2018-01-26 ENCOUNTER — Ambulatory Visit: Payer: BC Managed Care – PPO | Attending: Registered Nurse | Admitting: Physical Therapy

## 2018-01-26 DIAGNOSIS — I69354 Hemiplegia and hemiparesis following cerebral infarction affecting left non-dominant side: Secondary | ICD-10-CM

## 2018-01-26 DIAGNOSIS — R2681 Unsteadiness on feet: Secondary | ICD-10-CM

## 2018-01-26 DIAGNOSIS — M6281 Muscle weakness (generalized): Secondary | ICD-10-CM | POA: Diagnosis present

## 2018-01-26 DIAGNOSIS — R293 Abnormal posture: Secondary | ICD-10-CM

## 2018-01-26 DIAGNOSIS — R2689 Other abnormalities of gait and mobility: Secondary | ICD-10-CM

## 2018-01-27 NOTE — Therapy (Signed)
[facility] [address] [place], [place], [address] Phone: [phone]   Fax:  [phone]  Physical Therapy Evaluation  Patient Details  Name: Shelly Mcdaniel MRN: [id number] Date of Birth: 1955-11-15 Referring Provider: Danella Sensing, NP   Encounter Date: 01/26/2018  PT End of Session - 01/27/18 1237    Visit Number  1    Number of Visits  1   one time visit for w/c eval   Authorization Type  state BCBS    PT Start Time  1100    PT Stop Time  1225    PT Time Calculation (min)  85 min    Activity Tolerance  Patient tolerated treatment well    Behavior During Therapy  [facility] for tasks assessed/performed       Past Medical History:  Diagnosis Date  . Diabetes mellitus without complication ([place])   . Hyperlipidemia   . Hypertension   . Stroke ([place])   . UTI (urinary tract infection)     Past Surgical History:  Procedure Laterality Date  . BACK SURGERY    . BREAST SURGERY    . COLONOSCOPY WITH PROPOFOL N/A 12/24/2013   Procedure: COLONOSCOPY WITH PROPOFOL;  Surgeon: [name], MD;  Location: [facility];  Service: Endoscopy;  Laterality: N/A;  . RIGHT/LEFT HEART CATH AND CORONARY ANGIOGRAPHY N/A 08/10/2017   Procedure: RIGHT/LEFT HEART CATH AND CORONARY ANGIOGRAPHY;  Surgeon: Troy Sine, MD;  Location: [facility];  Service: Cardiovascular;  Laterality: N/A;    There were no vitals filed for this visit.   Subjective Assessment - 01/27/18 1214    Subjective  "I'm tired of being confined inside with this chair!"   Would like to pursue power mobility for increased independence    Patient is accompained by:  Family member    Pertinent History  h/o CVA 2017 x 2; h/o vertigo unrelated to stroke; frequent UTIs.     Patient Stated Goals  To obtain a power w/c for increased independence    Currently in Pain?  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  1-2    Has the patient had a decrease in activity level because of a fear of falling?   Yes    Is the patient reluctant to leave their home because of a fear of falling?   No      Prior Function   Level of Independence  Needs assistance with ADLs;Needs assistance with homemaking;Needs assistance with gait;Needs assistance with transfers        Mobility/Seating Evaluation    PATIENT INFORMATION: Name: Shelly Mcdaniel DOB: 11/19/1955  Sex: Female Date seen: 01/26/2018 Time: 11:00am  Address:  [place], [place] [address] Physician: Dr. Alysia Penna This evaluation/justification form will serve as the LMN for the following suppliers: __________________________ Supplier: [employer] Contact Person: Luz Brazen, Wess Botts Phone:  [phone]   Seating Therapist: Misty Stanley, PT, DPT Phone:   [phone]   Phone: ?????    Spouse/Parent/Caregiver name:  Shelly Mcdaniel  Phone number: [phone] Insurance/Payer: [place] PPO     Reason for Referral: Wheelchair evaluation  Patient/Caregiver Goals: To obtain a power wheelchair to improve safety and independence with household and outdoor mobility, ADL and household activities.  Patient was seen for face-to-face evaluation for new power wheelchair.  Also present was [employer] and patient's husband - Shelly Mcdaniel to discuss recommendations and wheelchair options.  Further paperwork was completed and sent to vendor.  Patient appears to qualify for power mobility device at this time per objective findings.   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  Back down to 163 lb with medication for diuresis.   History including Falls: 6 months ago pt transferring to [facility] alone and slid down to floor; previous falls include out of manual chair on to foot rests    HOME ENVIRONMENT: _2 House  _3 Condo/town home  _4 Apartment  _5 Assisted Living    _6 Lives Alone _7  Lives with Others                                                                                          Hours with caregiver: Husband works; Engineer, production comes at [employer]; spends 4 hours with aide and then rest of time with husband  _8 Home is accessible to patient           Stairs      _9 Yes _10  No     Ramp _11 Yes _12 No Comments:  ?????   COMMUNITY ADL: TRANSPORTATION: _13 Car    _14 Van    <RCVELFYBOFBPZWCH>_8<\/NIDPOEUMPNTIRWER>_15 Public Transportation    _16 Adapted w/c Lift    _17 Ambulance    _18 Other:       _19 Sits in wheelchair during transport  Employment/School: ????? Specific requirements pertaining to mobility ?????  Other: Has suitcase ramp husband uses to load w/c into back of van    FUNCTIONAL/SENSORY PROCESSING SKILLS:  Handedness:   _20 Right     _21 Left    _22 NA  Comments:  ?????  Functional Processing Skills for Wheeled Mobility _23 Processing Skills are adequate for safe wheelchair operation  Areas of concern than may interfere with safe operation of wheelchair Description of problem   _24  Attention to environment      _25 Judgment      _26  Hearing  _27  Vision or visual processing      _28 Motor Planning  _29  Fluctuations in Behavior  Requires supervision for safe mobility and to attend to L environment    VERBAL COMMUNICATION: _30 WFL receptive _31  WFL expressive _32 Understandable  _33 Difficult to  understand  _34 non-communicative _35  Uses an augmented communication device  CURRENT SEATING / MOBILITY: Current Mobility Base:  _36 None _37 Dependent _38 Manual _39 Scooter _40 Power  Type of Control: ?????  Manufacturer:  DriveSize:  54 x 73 Age: 62 years old  Current  Condition of Mobility Base:  Poor; missing anti-tippers, L brake broken and R brake over tightened - unable to fully lock chair for transfers  missing bilat brake handle pads, broken foot rest, loose arm rests, arm rest not height adjustable leaving LUE unsupported     Current Wheelchair components:  Sling back and seat, armrests: desk length, fixed height arm rests, flip back; ROHO cushion   Describe posture in present seating system:  Sling back too low, posterior pelvic tilt, increased thoracic kyphosis, L shoulder depressed; seat depth too short and no foot rests causing knees to sit lower than hips and pelvis to slide forwards in chair      SENSATION and SKIN ISSUES: Sensation _0 Intact  _1 Impaired _2 Absent  Level of sensation: impaired L side Pressure Relief: Able to perform effective pressure relief :    _3 Yes  _4  No Method: uses ROHO cushion for pressure relief If not, Why?: patient unaware of need to perform pressure relief; requires cues for repositioning and shifting  Skin Issues/Skin Integrity Current Skin Issues  _5 Yes _6 No _7 Intact _8  Red area_9  Open Area  _10 Scar Tissue _11 At risk from prolonged sitting Where  ?????  History of Skin Issues  _12 Yes _13 No Where  buttocks When  2 years ago  Hx of skin flap surgeries  _14 Yes _15 No Where  ????? When  ?????  Limited sitting tolerance _16 Yes _17 No Hours spent sitting in wheelchair daily: Has to spend some time in recliner or bed due to LE edema and hypotension  Complaint of Pain:  Please describe: h/o L shoulder pain; R hand pain when gripping rim of manual chair to propel   Swelling/Edema: bilat LE due to cardiomyopathy   ADL STATUS (in reference to wheelchair use):   Indep Assist Unable Indep with Equip Not assessed Comments  Dressing ????? X ????? ????? ????? sitting on BSC  Eating ????? X ????? ????? ????? needs help with cutting  Toileting ????? X ????? ????? ????? on BSC; needs help for balance when performing hygiene and clothing management  Bathing ????? X ????? ????? ????? Uses bath seat  Grooming/Hygiene ????? ????? ????? X ????? seated in w/c  Meal Prep ????? X ????? ????? ????? from w/c level  IADLS ????? X ????? ????? ????? ?????  Bowel Management: _18 Continent  _19 Incontinent  _20 Accidents Comments:  ?????  Bladder Management: _21 Continent  _22 Incontinent  _23 Accidents Comments:  ?????     WHEELCHAIR SKILLS: Manual w/c Propulsion: _24 UE or LE strength and endurance sufficient to participate in ADLs using manual wheelchair Arm : _25 left _26 right   _27 Both      Distance: household distances, smooth surfaces - increased difficulty over carpet in the home Foot:  _28 left _29 right   _30 Both  Operate Scooter: _31  Strength, hand grip, balance and transfer appropriate for use _32 Living environment is accessible for use of scooter  Operate Power w/c:  _33  Std. Joystick   _34  Alternative Controls Indep _35  Assist _36  Dependent/unable _37  N/A _38   _39 Safe          _40  Functional      Distance: 300'  Bed confined without wheelchair _41  Yes _42  No   STRENGTH/RANGE OF MOTION:  PROM/AROM Range of Motion Strength  Shoulder LUE: shoulder fixed into internal rotation; limited flexion and extension due to spasticity RUE: WNL  L: 1/5 R: WNL  Elbow LUE: fixed into flexion RUE: WNL L: 1/5 R: WNL  Wrist/Hand LUE: limited into flexion/extension, fixed finger flexion RUE: WNL L: 1/5 R: WNL  Hip LLE: AROM WFL into  flexion RLE: WNL L: Hip flexion and extension 3+/5 R: WNL  Knee LLE: Flexion to 90, limited extension due to hamstring tightness RLE: WNL L: flexion and extension 3+/5 R: WNL  Ankle LLE: Rests in inversion and supination RLE: WNL L: 0/5 R: WNL      MOBILITY/BALANCE:  _0  Patient is totally dependent for mobility  ?????    Balance Transfers Ambulation  Sitting Balance: Standing Balance: _1  Independent _2  Independent/Modified Independent  _3  WFL     _4  WFL _5  Supervision _6  Supervision  _7  Uses UE for balance  _8  Supervision _9  Min Assist _10  Ambulates with Assist  Min A    _11  Min Assist _12  Min assist _13  Mod Assist _14  Ambulates with Device:      _15  RW  _16  StW  _17  Cane  _18  quad cane  _19  Mod Assist _20  Mod assist _21  Max assist   _22  Max Assist _23  Max assist _24  Dependent _25  Indep. Short Distance Only  _26  Unable _27  Unable _28  Lift / Sling Required Distance (in feet)  400'   _29  Sliding board _30  Unable to Ambulate (see explanation below)  Cardio Status:  _31 Intact  _32  Impaired   _33  NA     Cardiomyopathy, CAD, orthostatic hypotension  Respiratory Status:  _34 Intact   _35 Impaired   _36 NA     Cardiomyopathy and deconditioning  Orthotics/Prosthetics: L AFO  Comments (Address manual vs power w/c vs scooter): Patient is not a safe, functional ambulator with cane or RW due to spastic L hemiparesis and recent deconditioning due to cardiomyopathy.  Pt currently has a manual w/c that she is able to propel short distances on smooth surfaces with RUE and RLE/LLE but does not have sufficient strength or endurance to propel longer distances, up/down ramp for home entry/exit or over thicker carpeted surfaces in her home.  Patient's current w/c does not provide sufficient postural support and is unsafe for transfers due to breakdown of parts.  Pt does not have sufficient UE strength/ROM or trunk control and balance to safely use power scooter.         Anterior / Posterior Obliquity Rotation-Pelvis ?????  PELVIS    _37  _38  _39   Neutral Posterior Anterior  _40  _41  _42   WFL Rt elev Lt elev  _43  _44  _45   WFL Right Left                      Anterior    Anterior     _46  Fixed _47  Other _48  Partly Flexible _49  Flexible   _50  Fixed _51  Other _52  Partly  Flexible  _53  Flexible  _54  Fixed _55  Other _56  Partly Flexible  _57  Flexible   TRUNK  _58  _59  _60   WFL ? Thoracic ? Lumbar  Kyphosis Lordosis  _61  _62  _63   WFL Convex Convex  Right Left _64 c-curve _65 s-curve _66 multiple  _67  Neutral _68  Left-anterior _69  Right-anterior     _70  Fixed _71  Flexible _72  Partly Flexible _73  Other  _74  Fixed _75  Flexible _76  Partly Flexible _77  Other  _78  Fixed             _79  Flexible _80  Partly Flexible _81  Other    Position Windswept  ?????  HIPS          _82            _83               _84    Neutral       Abduct  ADduct         _0           _1            _2   Neutral Right           Left      _3  Fixed _4  Subluxed _5  Partly Flexible _6  Dislocated _7  Flexible  _8  Fixed _9  Other _10  Partly Flexible  _11  Flexible                 Foot Positioning Knee Positioning  Typically wears L AFO but does not have today    _12  WFL  _13 Lt _14 Rt _15  WFL  _16 Lt _17 Rt    KNEES ROM concerns: ROM concerns:    & Dorsi-Flexed _18 Lt _19 Rt due to extensor tone requires stretching to position foot under knee    FEET Plantar Flexed _20 Lt _21 Rt      Inversion                 _22 Lt _23 Rt      Eversion                 _24 Lt _25 Rt     HEAD _26  Functional _27  Good Head Control  ?????  & _28  Flexed         _29  Extended _30  Adequate Head Control    NECK _31  Rotated  Lt  _32  Lat Flexed Lt _33  Rotated  Rt _34  Lat Flexed Rt _35  Limited Head Control     _36  Cervical Hyperextension _37  Absent  Head Control     SHOULDERS ELBOWS WRIST& HAND Receives botox injections for spasticity management      Left     Right    Left     Right    Left     Right   U/E _38 Functional           _39 Functional Flexion contracture ????? _40 Fisting             _41 Fisting      _42 elev   _43 dep      _44 elev   _45 dep       _46 pro -_47 retract     _48 pro  _49 retract _50 subluxed             _51 subluxed           Goals for Wheelchair Mobility  _52  Independence with mobility in the home with motor related ADLs (MRADLs)  _53  Independence with  MRADLs in the community _54  Provide dependent mobility  _55  Provide recline     _56 Provide tilt   Goals for Seating system _57  Optimize pressure distribution _58  Provide support needed to facilitate function or safety _59  Provide corrective forces to assist with maintaining or improving posture _60  Accommodate client's posture:   current seated postures and positions are not flexible or will not tolerate corrective forces _61  Client to be independent with relieving pressure in the wheelchair _62 Enhance physiological function such as breathing, swallowing, digestion  Simulation ideas/Equipment trials:Trial of group 2 power w/c in clinic in open areas and small kitchen; with supervision pt is able to safely negotiate in simulated household environment State why other equipment was unsuccessful:RW/cane does not provide pt with sufficient support and pt requires external assistance to ambulate household distances.  Pt does not demonstrate sufficient UE ROM and strength or endurance to manually propel manual w/c.  Pt doed not demonstrate sufficient balance to transfer independently to scooter or maintain posture on scooter seat; does not demonstrate sufficient UE ROM or strength  to propel scooter safely   MOBILITY BASE RECOMMENDATIONS and JUSTIFICATION: MOBILITY COMPONENT JUSTIFICATION  Manufacturer: Theatre manager: Elite ES with pan seat   Size: Width 18Seat Depth 20 _0 provide transport from point A to B      _1 promote Indep mobility  _2 is not a safe, functional ambulator _3 walker or cane inadequate _4 non-standard width/depth necessary to accommodate anatomical measurement _5  ?????  _6 Manual Mobility Base _7 non-functional ambulator    _8 Scooter/POV  _9 can safely operate  _10 can safely transfer   _11 has adequate trunk stability  _12 cannot functionally propel manual w/c  _13 Power Mobility Base  _14 non-ambulatory  _15 cannot functionally propel manual wheelchair  _16  cannot functionally and safely  operate scooter/POV _17 can safely operate and willing to  _18 Stroller Base _19 infant/child  _20 unable to propel manual wheelchair _21 allows for growth _22 non-functional ambulator _23 non-functional UE _24 Indep mobility is not a goal at this time  _25 Tilt  _26 Forward _27 Backward _28 Powered tilt  _29 Manual tilt  _30 change position against gravitational force on head and shoulders  _31 change position for pressure relief/cannot weight shift _32 transfers  _33 management of tone _34 rest periods _35 control edema _36 facilitate postural control  _37  ?????  _38 Recline  _39 Power recline on power base _40 Manual recline on manual base  _41 accommodate femur to back angle  _42 bring to full recline for ADL care  _43 change position for pressure relief/cannot weight shift _44 rest periods _45 repositioning for transfers or clothing/diaper /catheter changes _46 head positioning  _47 Lighter weight required _48 self- propulsion  _49 lifting _50  ?????  _51 Heavy Duty required _52 user weight greater than 250# _53 extreme tone/ over active movement _54 broken frame on previous chair _55  ?????  _56  Back  _57  Angle Adjustable _58  Custom molded Captain's seat _59 postural control _60 control of tone/spasticity _61 accommodation of range of motion _62 UE functional control _63 accommodation for seating system _64  ????? _65 provide lateral trunk support _66 accommodate deformity _67 provide posterior trunk support _68 provide lumbar/sacral support _69 support trunk in midline _70 Pressure relief over spinal processes  _71  Seat Cushion 18 x 20 ROHO High Profile _72 impaired sensation  _73 decubitus ulcers present _74 history of pressure ulceration _75 prevent pelvic extension _76 low maintenance  _77 stabilize pelvis  _78 accommodate obliquity _79 accommodate multiple deformity _80 neutralize lower extremity position _81 increase pressure distribution _82  ?????  _83  Pelvic/thigh support  _84  Lateral thigh guide _85  Distal medial pad  _86  Distal lateral pad _87  pelvis in neutral  _88 accommodate pelvis _89  position upper legs _90  alignment _91  accommodate ROM _92  decr adduction _93 accommodate tone _94 removable for transfers _95 decr abduction  _96  Lateral trunk Supports _97  Lt     _98  Rt _99 decrease lateral trunk leaning _100 control tone _101 contour for increased contact _102 safety  _103 accommodate asymmetry _104  ?????  _105  Mounting hardware  _106 lateral trunk supports  _107 back   _108 seat _109 headrest      _110  thigh support _111 fixed   _112 swing away _113 attach seat platform/cushion to w/c frame _114 attach back cushion to w/c frame _115 mount postural supports _116 mount headrest  _117 swing medial thigh support away _118 swing lateral supports away for transfers  _119  ?????    Armrests  _120 fixed _121 adjustable height _122 removable   _123 swing away  _124 flip back   _125 reclining _126 full length pads _127 desk    _128 pads tubular  _129 provide support with elbow at 90   _130 provide support for w/c tray _131 change of height/angles for variable activities _132 remove for transfers _133 allow to come closer to table top _134 remove for access to tables _135  ?????  Hangers/ Leg rests  _136 60 _137 70 _138 90 _139 elevating _140 heavy duty  _141 articulating _142 fixed _143 lift off _144 swing away     _145 power _146 provide LE support  _147 accommodate to hamstring tightness _148 elevate legs during recline   _149 provide change  in position for Legs _0 Maintain placement of feet on footplate _1 durability _2 enable transfers _3 decrease edema _4 Accommodate lower leg length _5  ?????  Foot support Footplate    <ZDGLOVFIEPPIRJJO>_8<\/CZYSAYTKZSWFUXNA>_3 Lt  _7  Rt  _8  Center mount _9 flip up     _10 depth/angle adjustable _11 Amputee adapter    _12  Lt     _13  Rt _14 provide foot support _15 accommodate to ankle ROM _16 transfers _17 Provide support for residual extremity _18  allow foot to go under wheelchair base _19  decrease tone  _20  ?????  _21  Ankle strap/heel loops _22 support foot on foot support _23 decrease extraneous movement _24 provide input to heel  _25 protect foot  Tires: _26 pneumatic  _27 flat free inserts  _28 solid   _29 decrease maintenance  _30 prevent frequent flats _31 increase shock absorbency _32 decrease pain from road shock _33 decrease spasms from road shock _34  ?????  _35  Headrest  _36 provide posterior head support _37 provide posterior neck support _38 provide lateral head support _39 provide anterior head support _40 support during tilt and recline _41 improve feeding   _42 improve respiration _43 placement of switches _44 safety  _45 accommodate ROM  _46 accommodate tone _47 improve visual orientation  _48  Anterior chest strap _49  Vest _50  Shoulder retractors  _51 decrease forward movement of shoulder _52 accommodation of TLSO _53 decrease forward movement of trunk _54 decrease shoulder elevation _55 added abdominal support _56 alignment _57 assistance with shoulder control  _58  ?????  Pelvic Positioner _59 Belt _60 SubASIS bar _61 Dual Pull _62 stabilize tone _63 decrease falling out of chair/ **will not Decr potential for sliding due to pelvic tilting _64 prevent excessive rotation _65 pad for protection over boney prominence _66 prominence comfort _67 special pull angle to control rotation _68  ?????  Upper Extremity Support _69 L   _70  R _71 Arm trough    _72 hand support _73  tray       _74 full tray _75 swivel mount _76 decrease edema      _77 decrease subluxation   _78 control tone   _79 placement for AAC/Computer/EADL _80 decrease gravitational pull on shoulders _81 provide midline positioning _82 provide support to increase UE function _83 provide hand support in natural position _84 provide work surface   POWER WHEELCHAIR CONTROLS  _85 Proportional  _86 Non-Proportional Type Joystick _87 Left  _88 Right _89 provides access for controlling wheelchair   _90 lacks motor control to operate proportional drive control <FTDDUKGURKYHCWCB>_7<\/SEGBTDVVOHYWVPXT>_06 unable to understand proportional controls  Actuator Control Module  _92 Single  _93 Multiple   _94 Allow the client to operate the power seat function(s) through the joystick control   _95 Safety Reset Switches _96 Used to change modes and stop the  wheelchair when driving in latch mode    _97 [employer]   _98 programming for accurate control _99 progressive Disease/changing condition _100 non-proportional drive control needed _101 Needed in order to operate power seat functions through joystick control   _102 Display box _103 Allows user to see in which mode and drive the wheelchair is set  _104 necessary for alternate controls    _105 Digital interface electronics _106 Allows w/c to operate when using alternative drive controls  <YIRSWNIOEVOJJKKX>_3<\/GHWEXHBZJIRCVELF>_810 ASL Head Array _108 Allows client to operate wheelchair  through switches placed in tri-panel headrest  _109 Sip and puff with tubing kit _110 needed to operate sip and puff drive controls  <FBPZWCHENIDPOEUM>_3<\/NTIRWERXVQMGQQPY>_195 Upgraded tracking electronics _112 increase safety when driving <KDTOIZTIWPYKDXIP>_3<\/ASNKNLZJQBHALPFX>_902 correct tracking when on uneven surfaces  _114 [facility] for switches or joystick _115 Attaches switches to w/c  _116 Swing away for access or transfers _117 midline for optimal placement _118 provides for consistent access  _119 Attendant controlled joystick plus mount _120 safety _121 long distance driving <IOXBDZHGDJMEQAST>_4<\/HDQQIWLNLGXQJJHE>_174 operation of seat functions _123 compliance with transportation regulations _124  ?????    Rear wheel placement/Axle adjustability _125 None _126 semi adjustable _127 fully adjustable  _128 improved UE access to wheels _129 improved stability _130 changing angle in space for improvement of postural stability _131 1-arm drive access <YCXKGYJEHUDJSHFW>_2<\/OVZCHYIFOYDXAJOI>_786 amputee pad placement _133  ?????  Wheel rims/ hand rims  _0 metal  _1 plastic coated _2 oblique projections _3 vertical projections _4 Provide ability to propel manual wheelchair  _5  Increase self-propulsion with hand weakness/decreased grasp  Push handles _6 extended  _7 angle adjustable  _8 standard _9 caregiver access _10 caregiver assist _11 allows "hooking" to enable increased ability to perform ADLs or maintain balance  One armed device  _12 Lt   _13 Rt _14 enable propulsion of manual wheelchair with one arm   _15  ?????   Brake/wheel lock extension _16  Lt   _17  Rt _18 increase indep in applying wheel locks    _19 Side guards _20 prevent clothing getting caught in wheel or becoming soiled _21  prevent skin tears/abrasions  Battery: U1 x 2 _22 to power wheelchair ?????  Other: ????? ????? ?????  The above equipment has a life- long use expectancy. Growth and changes in medical and/or functional conditions would be the exceptions. This is to certify that the therapist has no financial relationship with durable medical provider or manufacturer. The therapist will not receive remuneration of any kind for the equipment recommended in this evaluation.   Patient has mobility limitation that significantly impairs safe, timely participation in one or more mobility related ADL's.  (bathing, toileting, feeding, dressing, grooming, moving from room to room)                                                             _23  Yes _24  No Will mobility device sufficiently improve ability to participate and/or be aided in participation of MRADL's?         _25  Yes _26  No Can limitation be compensated for with use of a cane or walker?                                                                                _27  Yes _28  No Does patient or caregiver demonstrate ability/potential ability & willingness to safely use the mobility device?   _29  Yes _30  No Does patient's home environment support use of recommended mobility device?                                                    _31  Yes _32  No Does patient have sufficient upper extremity function necessary to functionally propel a manual wheelchair?    _33  Yes _34  No Does patient have sufficient strength and trunk stability to safely operate a POV (scooter)?                                  _35  Yes _36  No Does patient need additional features/benefits provided by a power wheelchair for MRADL's in the home?       _37  Yes _38  No Does the patient demonstrate the ability to safely use a power wheelchair?                                                              [  x] Yes _0  No  Therapist Name  Printed: Rico Junker, PT, DPT Date: 01/26/2018  Therapist's Signature:   Date:   Supplier's Name Printed: [employer] Date: 01/26/2018  Supplier's Signature:   Date:  Patient/Caregiver Signature:   Date:     This is to certify that I have read this evaluation and do agree with the content within:      Physician's Name Printed: ?????  Physician's Signature:  Date:     This is to certify that I, the above signed therapist have the following affiliations: _1  This DME provider _2  Manufacturer of recommended equipment _3  Patient's long term care facility _4  None of the above           Objective measurements completed on examination: See above findings.              PT Education - 01/27/18 1235    Education provided  Yes    Education Details  initiated safety with power mobility and attention to environment, process of obtaining power mobility, reasons that power scooter would not be appropriate for pt    Person(s) Educated  Patient;Spouse    Methods  Explanation;Demonstration    Comprehension  Verbal cues required         Plan - 01/27/18 1237    Clinical Impression Statement  Pt is a 62 y.o. female well known to this clinic who is currently participating in home health PT due to recent deconditioning due to cardiomyopathy but was referred to this clinic for evaluation for power mobility.  Wheelchair evaluation performed with [employer] [facility].  Recommendations made for Group 2 power wheelchair.  Please see LMN for full details.      History and Personal Factors relevant to plan of care:  CVA, cardiomyopathy -currently not independent with w/c mobility in manual w/c    Clinical Presentation  Stable    Clinical Presentation due to:  wheelchair eval only    Clinical Decision Making  Low    Rehab Potential  Good    PT Frequency  One time visit    PT Treatment/Interventions  Wheelchair mobility training    Consulted and Agree with Plan  of Care  Patient;Family member/caregiver    Family Member Consulted  spouse       Patient will benefit from skilled therapeutic intervention in order to improve the following deficits and impairments:  Decreased mobility, Cardiopulmonary status limiting activity, Abnormal gait, Decreased endurance, Decreased activity tolerance, Decreased balance, Decreased safety awareness, Decreased range of motion, Decreased strength, Difficulty walking, Impaired tone, Impaired UE functional use, Impaired vision/preception, Postural dysfunction  Visit Diagnosis: Hemiplegia and hemiparesis following cerebral infarction affecting left non-dominant side (HCC)  Unsteadiness on feet  Abnormal posture  Muscle weakness (generalized)  Other abnormalities of gait and mobility     Problem List Patient Active Problem List   Diagnosis Date Noted  . Cardiomyopathy ([place])   . Coronary artery disease involving native coronary artery of native heart without angina pectoris   . Ischemic cardiomyopathy 07/04/2017  . 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 Orthostatic hypotension 04/08/2016  . Muscle cramps 04/08/2016  . Right pontine cerebrovascular accident ([place]) 04/08/2016  . Spastic hemiparesis of left nondominant side ([place]) 04/08/2016  . Cerebral thrombosis with cerebral infarction 04/06/2016  . Basilar artery stenosis   . Dysarthria 04/05/2016  . History of CVA with residual deficit 04/05/2016  . Sinus tachycardia 04/05/2016  . Acute encephalopathy 04/05/2016  . Chronic  pain 04/05/2016  . Other hyperlipidemia 04/05/2016  . Ischemic stroke ([place])   . 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 TIA (transient ischemic attack) 11/07/2014  . Uncontrolled hypertension 11/07/2014  . Controlled type 2 diabetes mellitus with diabetic nephropathy ([place]) 11/07/2014   Rico Junker, PT, DPT 01/27/18    12:45 PM    [facility] [address] [place],  [place], [address] Phone: [phone]   Fax:  [phone]  Name: Shelly Mcdaniel MRN: [id number] Date of Birth: [birth year]

## 2018-01-30 ENCOUNTER — Telehealth: Payer: Self-pay | Admitting: Neurology

## 2018-01-30 ENCOUNTER — Other Ambulatory Visit: Payer: Self-pay

## 2018-01-30 ENCOUNTER — Ambulatory Visit (HOSPITAL_COMMUNITY)
Admission: RE | Admit: 2018-01-30 | Discharge: 2018-01-30 | Disposition: A | Discharge status: Home / Self Care | Payer: BC Managed Care – PPO | Source: Ambulatory Visit | Attending: Neurology | Admitting: Neurology

## 2018-01-30 DIAGNOSIS — I6523 Occlusion and stenosis of bilateral carotid arteries: Secondary | ICD-10-CM | POA: Diagnosis not present

## 2018-01-30 DIAGNOSIS — G8110 Spastic hemiplegia affecting unspecified side: Secondary | ICD-10-CM | POA: Insufficient documentation

## 2018-01-30 DIAGNOSIS — I651 Occlusion and stenosis of basilar artery: Secondary | ICD-10-CM | POA: Diagnosis not present

## 2018-01-30 DIAGNOSIS — G9389 Other specified disorders of brain: Secondary | ICD-10-CM | POA: Diagnosis not present

## 2018-01-30 LAB — POCT I-STAT CREATININE: Creatinine, Ser: 0.6 mg/dL (ref 0.44–1.00)

## 2018-01-30 MED ORDER — IOPAMIDOL (ISOVUE-370) INJECTION 76%
INTRAVENOUS | Status: AC
  Filled 2018-01-30: qty 100

## 2018-01-30 MED ORDER — IOPAMIDOL (ISOVUE-370) INJECTION 76%
100.0000 mL | Freq: Once | INTRAVENOUS | Status: AC | PRN
  Administered 2018-01-30: 100 mL via INTRAVENOUS

## 2018-01-30 NOTE — Telephone Encounter (Signed)
V[facility] [phone] needs I-stat creatinine lab order put in today prior to pt CT appt today at 1:30.

## 2018-01-30 NOTE — Progress Notes (Signed)
Order put in for scan.  °

## 2018-01-30 NOTE — Telephone Encounter (Signed)
RN spoke with Rebecka ApleyVanda about pt needed I stat creatinine lab order. Rn put order in while on the phone with Rebecka ApleyVanda. The order was put in and correct. Rebecka ApleyVanda stated she does see the order.

## 2018-01-31 ENCOUNTER — Telehealth: Payer: Self-pay

## 2018-01-31 NOTE — Telephone Encounter (Signed)
Notes recorded by Hildred AlaminMurrell, Katrina Y, RN on 01/31/2018 at 11:32 AM EDT Rn spoke with patient and husband about Ct angio brain results. Rn stated that the brain shows persistent moderate narrowing of blood vessels in back of head at skull base. NO new or worrisome findings. RN stated they can check the report on my chart. The pt and husband verbalized understanding. ------

## 2018-01-31 NOTE — Telephone Encounter (Signed)
-----   Message from Micki RileyPramod S Sethi, MD sent at 01/30/2018  4:09 PM EDT ----- Shelly RoachKindly inform the patient that follow up CT angiogram study of brain shows stable but persistent moderate narrowing of blood vessel in back of head at skull base.No new or worrisome finding.

## 2018-02-01 ENCOUNTER — Encounter: Payer: BC Managed Care – PPO | Attending: Physical Medicine & Rehabilitation | Admitting: Registered Nurse

## 2018-02-01 ENCOUNTER — Encounter: Payer: Self-pay | Admitting: Registered Nurse

## 2018-02-01 ENCOUNTER — Telehealth: Payer: Self-pay | Admitting: Cardiovascular Disease

## 2018-02-01 VITALS — BP 134/87 | HR 81 | Ht 68.5 in | Wt 163.0 lb

## 2018-02-01 DIAGNOSIS — G8114 Spastic hemiplegia affecting left nondominant side: Secondary | ICD-10-CM | POA: Diagnosis not present

## 2018-02-01 DIAGNOSIS — I6350 Cerebral infarction due to unspecified occlusion or stenosis of unspecified cerebral artery: Secondary | ICD-10-CM

## 2018-02-01 DIAGNOSIS — I69354 Hemiplegia and hemiparesis following cerebral infarction affecting left non-dominant side: Secondary | ICD-10-CM | POA: Diagnosis not present

## 2018-02-01 DIAGNOSIS — G8110 Spastic hemiplegia affecting unspecified side: Secondary | ICD-10-CM | POA: Diagnosis not present

## 2018-02-01 DIAGNOSIS — M7502 Adhesive capsulitis of left shoulder: Secondary | ICD-10-CM

## 2018-02-01 NOTE — Telephone Encounter (Signed)
[facility]. Tresa EndoKelly.

## 2018-02-01 NOTE — Telephone Encounter (Signed)
New Message:     He needs a verbal order to continue Home Health Physical Therapy for 2 times a week for 4 weeks.

## 2018-02-06 NOTE — Progress Notes (Signed)
   Subjective:    Patient ID: Shelly Mcdaniel, female    DOB: 10-04-1955, 62 y.o.   MRN: [id number]  HPI: Shelly Mcdaniel is a 62 year old female who is here for a Face to [employer] engineer[employer] Power Wheelchair Evaluation. I have reviewed the Physical Therapist Wheelchair evaluation Form and concur with their assessment and evaluation. The Physical Therapist recommends [employer] Model : Elite ES with pan seat: Size : Width 18 and Seat 20. Shelly Mcdaniel has the Physical and Mental ability to operate the Power Wheelchair. The Power Wheelchair will be a great asset for Shelly Mcdaniel. This will help her with mobility in the home, also improving her independence with activities of daily living, such as bathing, dressing and toileting.   Medical History: Right CVA with spastic hemiparesis of left non-dominant side, Ischemic Cardiomyopathy, CAD, Left Shoulder with Adhesive Capsulitis, Ataxia, Type 2 DM with peripheral neuropathy.   Shelly Mcdaniel receiving Physical Therapy twive a week from [facility].   Shelly Mcdaniel denies any pain at this time.    Review of Systems     Objective:   Physical Exam  Constitutional: She is oriented to person, place, and time. She appears well-developed and well-nourished.  HENT:  Head: Normocephalic and atraumatic.  Neck: Normal range of motion. Neck supple.  Cardiovascular: Normal rate and regular rhythm.  Pulmonary/Chest: Effort normal and breath sounds normal.  Musculoskeletal:  Normal Muscle Bulk and Muscle Testing Reveals: Upper Extremities: Right: Full ROM and Muscle Strength 5/5 Left: Hemiparesis and Muscle Strength 1/5 Lower Extremities: Right: Full ROM and Muscle Strength 5/5 Left Lower Extremities: Decreased ROM and Muscle Strength 4/5 Arrived in wheelchair  Neurological: She is alert and oriented to person, place, and time.  Skin: Skin is warm and dry.  Psychiatric: She has a normal mood and affect. Her behavior is normal.  Nursing note and vitals  reviewed.         Assessment & Plan:  1. Right Pontine CVA/ Hemiparesis affecting Left Side as late effect of CVA/ Spastic Hemiparesis of left nondominant side. . Continue Baclofen, Plavix,  S/P Botox on 08/15. Continue Physical Therapy. Continue to Monitor.  2. Adhesive Capsulitis of Left Shoulder: Continue with Therapy. Continue to Monitor.   30 minutes of face to face patient care time was spent during this visit. All questions were encouraged and answered.

## 2018-02-15 ENCOUNTER — Telehealth: Payer: Self-pay | Admitting: Cardiovascular Disease

## 2018-02-15 NOTE — Telephone Encounter (Signed)
New Message:     Maurine Minister physical therapist for the patient  is requesting a verbal order

## 2018-02-15 NOTE — Telephone Encounter (Signed)
Spoke with dennis, verbal order given for PT for 4 more weeks.

## 2018-02-20 ENCOUNTER — Other Ambulatory Visit: Payer: Self-pay

## 2018-02-20 ENCOUNTER — Ambulatory Visit (HOSPITAL_COMMUNITY): Payer: BC Managed Care – PPO | Attending: Cardiology

## 2018-02-20 DIAGNOSIS — I2510 Atherosclerotic heart disease of native coronary artery without angina pectoris: Secondary | ICD-10-CM | POA: Diagnosis not present

## 2018-02-20 DIAGNOSIS — I5042 Chronic combined systolic (congestive) and diastolic (congestive) heart failure: Secondary | ICD-10-CM | POA: Diagnosis present

## 2018-02-20 DIAGNOSIS — E785 Hyperlipidemia, unspecified: Secondary | ICD-10-CM | POA: Diagnosis not present

## 2018-02-20 DIAGNOSIS — I110 Hypertensive heart disease with heart failure: Secondary | ICD-10-CM | POA: Diagnosis not present

## 2018-02-20 DIAGNOSIS — Z8673 Personal history of transient ischemic attack (TIA), and cerebral infarction without residual deficits: Secondary | ICD-10-CM | POA: Diagnosis not present

## 2018-02-20 DIAGNOSIS — I255 Ischemic cardiomyopathy: Secondary | ICD-10-CM | POA: Insufficient documentation

## 2018-02-20 DIAGNOSIS — I43 Cardiomyopathy in diseases classified elsewhere: Secondary | ICD-10-CM | POA: Diagnosis not present

## 2018-02-20 DIAGNOSIS — E119 Type 2 diabetes mellitus without complications: Secondary | ICD-10-CM | POA: Diagnosis not present

## 2018-02-21 ENCOUNTER — Other Ambulatory Visit: Payer: Self-pay | Admitting: *Deleted

## 2018-03-08 ENCOUNTER — Other Ambulatory Visit: Payer: Self-pay

## 2018-03-08 ENCOUNTER — Encounter: Payer: BC Managed Care – PPO | Attending: Physical Medicine & Rehabilitation

## 2018-03-08 ENCOUNTER — Encounter: Payer: Self-pay | Admitting: Physical Medicine & Rehabilitation

## 2018-03-08 ENCOUNTER — Ambulatory Visit (HOSPITAL_BASED_OUTPATIENT_CLINIC_OR_DEPARTMENT_OTHER): Payer: BC Managed Care – PPO | Admitting: Physical Medicine & Rehabilitation

## 2018-03-08 VITALS — BP 137/85 | HR 77 | Ht 68.5 in | Wt 163.0 lb

## 2018-03-08 DIAGNOSIS — G8110 Spastic hemiplegia affecting unspecified side: Secondary | ICD-10-CM | POA: Diagnosis present

## 2018-03-08 DIAGNOSIS — G8114 Spastic hemiplegia affecting left nondominant side: Secondary | ICD-10-CM | POA: Diagnosis not present

## 2018-03-08 DIAGNOSIS — I6350 Cerebral infarction due to unspecified occlusion or stenosis of unspecified cerebral artery: Secondary | ICD-10-CM | POA: Diagnosis not present

## 2018-03-08 NOTE — Progress Notes (Signed)
Subjective:    Patient ID: Shelly Mcdaniel, female    DOB: 1955/07/06, 62 y.o.   MRN: [id number]  HPI  62 year old female with chronic left spastic hemiplegia secondary to right CVA approximately 2 years ago who follows up after receiving botulinum toxin injection performed 6 weeks ago targeting the following muscle groups [id number] FDS 50 FDP 50 FCR 50  Patient feels like she can move her fingers better she is able to move the thumb into a pinch as well as extension pattern fingers can also be slightly extended She recently went to a family member's wedding and walk down the aisle AMb 500' with PT, using rolling walker rather than parallel bars    Pain Inventory Average Pain 1 Pain Right Now 0 My pain is .  In the last 24 hours, has pain interfered with the following? General activity 2 Relation with others 1 Enjoyment of life 3 What TIME of day is your pain at its worst? night Sleep (in general) Good  Pain is worse with: some activites Pain improves with: heat/ice and injections Relief from Meds: 2  Mobility walk with assistance use a walker ability to climb steps?  no do you drive?  no use a wheelchair needs help with transfers  Function disabled: date disabled . I need assistance with the following:  meal prep, household duties and shopping  Neuro/Psych trouble walking  Prior Studies Any changes since last visit?  no  Physicians involved in your care Any changes since last visit?  no   Family History  Problem Relation Age of Onset  . Stroke Mother   . Dementia Mother   . Diabetes Father   . Stroke Father   . CAD Father   . Diabetes Sister   . Diabetes Brother   . CAD Brother   . Stroke Brother   . Kidney failure Brother   . Hypertension Other   . Hyperlipidemia Other   . Stroke Other   . Heart disease Other   . Diabetes Other    Social History   Socioeconomic History  . Marital status: Married    Spouse name: Not on file  .  Number of children: Not on file  . Years of education: Not on file  . Highest education level: Not on file  Occupational History  . Not on file  Social Needs  . Financial resource strain: Not on file  . Food insecurity:    Worry: Not on file    Inability: Not on file  . Transportation needs:    Medical: Not on file    Non-medical: Not on file  Tobacco Use  . Smoking status: Never Smoker  . Smokeless tobacco: Never Used  Substance and Sexual Activity  . Alcohol use: No    Alcohol/week: 0.0 standard drinks  . Drug use: No  . Sexual activity: Not on file  Lifestyle  . Physical activity:    Days per week: Not on file    Minutes per session: Not on file  . Stress: Not on file  Relationships  . Social connections:    Talks on phone: Not on file    Gets together: Not on file    Attends religious service: Not on file    Active member of club or organization: Not on file    Attends meetings of clubs or organizations: Not on file    Relationship status: Not on file  Other Topics Concern  . Not on file  Social History  Narrative  . Not on file   Past Surgical History:  Procedure Laterality Date  . BACK SURGERY    . BREAST SURGERY    . COLONOSCOPY WITH PROPOFOL N/A 12/24/2013   Procedure: COLONOSCOPY WITH PROPOFOL;  Surgeon: Charolett Bumpers, MD;  Location: [facility];  Service: Endoscopy;  Laterality: N/A;  . RIGHT/LEFT HEART CATH AND CORONARY ANGIOGRAPHY N/A 08/10/2017   Procedure: RIGHT/LEFT HEART CATH AND CORONARY ANGIOGRAPHY;  Surgeon: Lennette Bihari, MD;  Location: [facility]AS[facility];  Service: Cardiovascular;  Laterality: N/A;   Past Medical History:  Diagnosis Date  . Diabetes mellitus without complication (HCC)   . Hyperlipidemia   . Hypertension   . Stroke (HCC)   . UTI (urinary tract infection)    BP 137/85   Pulse 77   Ht 5' 8.5" (1.74 m) Comment: last reported  Wt 163 lb (73.9 kg) Comment: last reported  BMI 24.42 kg/m   Opioid Risk Score:   Fall Risk  Score:  `1  Depression screen PHQ 2/9  Depression screen [facility] 2/9 03/08/2018 12/18/2017 10/12/2017 07/14/2016 05/12/2016  Decreased Interest 0 0 0 0 3  Down, Depressed, Hopeless 0 0 0 0 0  PHQ - 2 Score 0 0 0 0 3  Altered sleeping - - - - 2  Tired, decreased energy - - - - 3  Feeling bad or failure about yourself  - - - - 0  Trouble concentrating - - - - 3  Moving slowly or fidgety/restless - - - - 2  Suicidal thoughts - - - - 0  PHQ-9 Score - - - - 13  Difficult doing work/chores - - - - Very difficult  Some recent data might be hidden   Review of Systems  Constitutional: Negative.   HENT: Negative.   Eyes: Negative.   Respiratory: Negative.   Cardiovascular: Negative.   Gastrointestinal: Negative.   Endocrine: Negative.   Genitourinary: Negative.   Musculoskeletal: Positive for gait problem.       Spasticity  Skin: Negative.   Allergic/Immunologic: Negative.   Neurological: Positive for weakness.  Hematological: Negative.   Psychiatric/Behavioral: Negative.   All other systems reviewed and are negative.      Objective:   Physical Exam Motor strength 3/5 lateral pinch left hand.  Trace finger extensors in the fourth and fifth digits trace thumb extension to minus elbow flexor 0 at the shoulder abductors and flexors.  4/5 left hip flexion knee extension trace ankle dorsiflexion wears AFO. Right upper extremity right lower extremity strength is normal Left shoulder has pain with abduction around 90 degrees, patient has good external rotation without pain. Mood and affect bright and alert  Tone Modified Ashworth score 3 at the elbow flexors, 1 at the finger flexors, 1 at the wrist flexors     Assessment & Plan:  1.  Left spastic hemiplegia secondary to right CVA, improvement with finger flexor tone following botulinum toxin injection performed approximately 6 weeks ago.  This is allowing her to get some extension of the fingers as noted. She still has severe elbow flexor  spasticity and would add botulinum toxin 100 units to the left biceps, repeat in 6 weeks will continue current dosing for finger flexors and wrist flexor

## 2018-03-19 ENCOUNTER — Ambulatory Visit (INDEPENDENT_AMBULATORY_CARE_PROVIDER_SITE_OTHER): Payer: BC Managed Care – PPO | Admitting: Cardiovascular Disease

## 2018-03-19 ENCOUNTER — Encounter: Payer: Self-pay | Admitting: Cardiovascular Disease

## 2018-03-19 VITALS — BP 137/85 | HR 80 | Ht 68.5 in | Wt 191.0 lb

## 2018-03-19 DIAGNOSIS — E1140 Type 2 diabetes mellitus with diabetic neuropathy, unspecified: Secondary | ICD-10-CM

## 2018-03-19 DIAGNOSIS — I69359 Hemiplegia and hemiparesis following cerebral infarction affecting unspecified side: Secondary | ICD-10-CM

## 2018-03-19 DIAGNOSIS — I10 Essential (primary) hypertension: Secondary | ICD-10-CM | POA: Diagnosis not present

## 2018-03-19 DIAGNOSIS — E785 Hyperlipidemia, unspecified: Secondary | ICD-10-CM

## 2018-03-19 DIAGNOSIS — I2510 Atherosclerotic heart disease of native coronary artery without angina pectoris: Secondary | ICD-10-CM

## 2018-03-19 DIAGNOSIS — Z794 Long term (current) use of insulin: Secondary | ICD-10-CM

## 2018-03-19 DIAGNOSIS — I43 Cardiomyopathy in diseases classified elsewhere: Secondary | ICD-10-CM

## 2018-03-19 MED ORDER — CARVEDILOL 3.125 MG PO TABS: ORAL_TABLET | ORAL | 3 refills | Status: AC

## 2018-03-19 NOTE — Progress Notes (Signed)
Cardiology Office Note    Date:  03/21/2018   ID:  Shelly Mcdaniel, DOB 1956-05-18, MRN [id number]  PCP:  Lajean Manes, MD  Cardiologist:  Shelva Majestic, MD    History of Present Illness:  Shelly Mcdaniel is a 62 y.o. female who presents to the office for a 3 month evaluation.  [name] is a wife of Mr. Shelly Mcdaniel who I know through his previous work in cardiac rehabilitation.  Her son is a paramedic, and her daughter is in an anesthesiologist at [employer] in cardiothoracic surgery.  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  She stabilized and a TEE and CUS were unremarkable.  She was discharged with aspirin and simvastatin.   In October 2017, she was readmitted with increased confusion and lethargy for 3 days.  She developed a left hemiparesis and subsequent stroke workup again showed right pontine and bilateral cerebellar but don't go infarcts.  An MRA showed progressive abnormalities.  Ultimately, she was placed on dual antiplatelet therapy and statin therapy with Crestor.  Due to orthostatic hypotension, TED hose were ordered with a blood pressure goal of 6:07 to 371 mm systolically.  She had recently developed symptoms of shortness of breath and vague chest pressure.  She was evaluated on 06/12/2017 and was found to have "fluid in her lungs." She  was referred for an echo Doppler evaluation on 06/26/2017 by Dr. Felipa Eth which showed reduced ejection fraction at 35-40% and grade 2 diastolic dysfunction.  Compared to a 2016 echo.  There was now significant development of systolic dysfunction with global hypokinesis.  She was initially seen by [name]. At that time, I was introduced to the patient due to concerns that the patient most likely will need a right and left heart cardiac catheterization.  She subsequently underwent laboratory which revealed a serum  creatinine of 0.73.  Hemoglobin A1c was increased at 7.8.  Lipid studies in September 2018 showed an LDL cholesterol at 80 with a total cholesterol 145, triglycerides 125, and HDL 42.  Her shortness of breath has improved with low-dose Lasix.  She is on Plavix 75 mg daily.  Apparently she is on simvastatin 40 mg a not Crestor.  With her LV dysfunction.  She was started on lisinopril 5 mg daily.  She is diabetic on insulin and metformin.    I saw her for follow-up evaluation on July 26, 2017 at which time she was feeling better with furosemide and low-dose lisinopril.  She underwent right and left heart cardiac catheterization on August 10, 2017 by me which showed severe cardiomyopathy with diffuse global hypocontractility and an EF of 20-25%.  There was mild multivessel CAD with 40% proximal diagonal stenosis, 40% LAD stenosis after the diagonal, 40% circumflex stenosis, and 50-60% mid RCA stenoses with 85% focal stenosis in a small caliber RV marginal branch.  She has significant right heart pressure elevation with pulmonary hypertension with a mean PA pressure at 48-50 mm.  Allowing her catheterization, I recommended discontinuing lisinopril and 36 hours later she was started on samples of Entresto 24/26 mg twice daily.  I also increase carvedilol to 6.25 mg twice daily.    When I saw her, in March 2019, she was doing well.  At that time titrated Entresto to 49/51 mg twice a day.  BNP level  on Oct 18, 2017 was 1700.  Her blood pressure has  been elevated at home and on one occasion had increased to 210/110.  Had transient development of nausea which improved with Phenergan.  She also experienced some vertigo for several weeks and had some nystagmus.  She is scheduled to have a neurology follow-up evaluation. When  last saw her on Oct 12, 2017 she was inadvertently taking Entresto only once a day instead of twice a day.  I recommended she take 49/51 mg twice a day.  On this therapy she has felt improved.   She denies any recurrent shortness of breath.  A follow-up proBNP level 2 days ago was markedly improved at 233.  He had lab work days ago which according to her husband was nonfasting.  However, her glucose was markedly elevated at 548.  BUN 15 creatinine 0.92.  Anion gap was normal.    When I last saw her in late June 2019 I increased Entresto to 97/103 2 times a day and decreased her Lasix to just 10 mg.  She has seen Dr. Leonie Mcdaniel for neurologic follow-up.  Her diabetes has been better controlled.  She states her blood sugars have been running in the upper 80s to 90s.  She denies any chest pain.  Her shortness of breath has resolved.  She has more energy.  She underwent a follow-up echo Doppler study on February 20, 2018 which now shows normalization of LV function with an EF of 55 to 60%.  There was grade 1 diastolic dysfunction.  She presents for reevaluation.  Past Medical History:  Diagnosis Date  . Diabetes mellitus without complication ([place])   . Hyperlipidemia   . Hypertension   . Stroke ([place])   . UTI (urinary tract infection)     Past Surgical History:  Procedure Laterality Date  . BACK SURGERY    . BREAST SURGERY    . COLONOSCOPY WITH PROPOFOL N/A 12/24/2013   Procedure: COLONOSCOPY WITH PROPOFOL;  Surgeon: Shelly Fair, MD;  Location: [facility];  Service: Endoscopy;  Laterality: N/A;  . RIGHT/LEFT HEART CATH AND CORONARY ANGIOGRAPHY N/A 08/10/2017   Procedure: RIGHT/LEFT HEART CATH AND CORONARY ANGIOGRAPHY;  Surgeon: Shelly Sine, MD;  Location: [facility];  Service: Cardiovascular;  Laterality: N/A;    Current Medications: Outpatient Medications Prior to Visit  Medication Sig Dispense Refill  . acetaminophen (TYLENOL) 500 MG tablet Take 1,000 mg by mouth every 6 (six) hours as needed for moderate pain.    Marland Kitchen albuterol (PROVENTIL HFA;VENTOLIN HFA) 108 (90 Base) MCG/ACT inhaler Inhale 1-2 puffs into the lungs every 6 (six) hours as needed for wheezing or shortness  of breath. 1 Inhaler 0  . baclofen (LIORESAL) 10 MG tablet Take 1.5 tablets (15 mg total) by mouth 3 (three) times daily. (Patient taking differently: Take 10 mg by mouth at bedtime. ) 120 tablet 1  . benzonatate (TESSALON) 100 MG capsule Take 1 capsule (100 mg total) by mouth every 8 (eight) hours. (Patient taking differently: Take 100 mg by mouth 3 (three) times daily as needed for cough. ) 21 capsule 0  . clopidogrel (PLAVIX) 75 MG tablet Take 1 tablet (75 mg total) by mouth daily. (Patient taking differently: Take 75 mg by mouth at bedtime. ) 30 tablet 0  . ezetimibe (ZETIA) 10 MG tablet Take 1 tablet (10 mg total) by mouth daily. 90 tablet 3  . furosemide (LASIX) 20 MG tablet Take 10 mg by mouth daily.    . hydroxypropyl methylcellulose / hypromellose (ISOPTO TEARS / GONIOVISC) 2.5 % ophthalmic  solution Place 1 drop into both eyes 3 (three) times daily as needed for dry eyes.    Marland Kitchen ibuprofen (ADVIL,MOTRIN) 200 MG tablet Take 600 mg by mouth every 6 (six) hours as needed for moderate pain.    Marland Kitchen insulin aspart (NOVOLOG) 100 UNIT/ML injection Inject 4 Units into the skin 2 (two) times daily as needed for high blood sugar. Only if B/S is over 275    . LEVEMIR FLEXTOUCH 100 UNIT/ML Pen Inject 25 Units into the skin every evening. (Patient taking differently: Inject 30 Units into the skin every evening. ) 15 mL 11  . lidocaine (LIDODERM) 5 % Place 3 patches onto the skin daily. Remove & Discard patch within 12 hours or as directed by MD (Patient taking differently: Place 1 patch onto the skin daily as needed. Remove & Discard patch within 12 hours or as directed by MD) 60 patch 0  . Melatonin 5 MG TABS Take 5 mg by mouth at bedtime.    . metFORMIN (GLUCOPHAGE-XR) 500 MG 24 hr tablet Take 1,000 mg by mouth every evening.    . ondansetron (ZOFRAN) 4 MG tablet Take 1 tablet (4 mg total) by mouth every 8 (eight) hours as needed for nausea or vomiting. (Patient taking differently: Take 4 mg by mouth at  bedtime. ) 12 tablet 0  . Phenazopyridine HCl (AZO TABS PO) Take 1 tablet by mouth 3 (three) times daily as needed. Bladder pain    . pregabalin (LYRICA) 25 MG capsule Take 50 mg by mouth at bedtime.     . sacubitril-valsartan (ENTRESTO) 97-103 MG Take 1 tablet by mouth 2 (two) times daily. 180 tablet 3  . scopolamine (TRANSDERM-SCOP) 1 MG/3DAYS Place 1 patch (1.5 mg total) onto the skin every 3 (three) days. 10 patch 12  . senna-docusate (SENOKOT-S) 8.6-50 MG tablet Take 1 tablet by mouth at bedtime.    . sertraline (ZOLOFT) 50 MG tablet Take 50 mg by mouth at bedtime.     . simvastatin (ZOCOR) 40 MG tablet Take 1 tablet (40 mg total) by mouth at bedtime. 90 tablet 3  . carvedilol (COREG) 3.125 MG tablet TAKE 2 TABLETS(6.25 MG) BY MOUTH TWICE DAILY 120 tablet 3   No facility-administered medications prior to visit.      Allergies:   Bee venom; Atorvastatin; Sulfamethoxazole-trimethoprim; and Codeine   Social History   Socioeconomic History  . Marital status: Married    Spouse name: Not on file  . Number of children: Not on file  . Years of education: Not on file  . Highest education level: Not on file  Occupational History  . Not on file  Social Needs  . Financial resource strain: Not on file  . Food insecurity:    Worry: Not on file    Inability: Not on file  . Transportation needs:    Medical: Not on file    Non-medical: Not on file  Tobacco Use  . Smoking status: Never Smoker  . Smokeless tobacco: Never Used  Substance and Sexual Activity  . Alcohol use: No    Alcohol/week: 0.0 standard drinks  . Drug use: No  . Sexual activity: Not on file  Lifestyle  . Physical activity:    Days per week: Not on file    Minutes per session: Not on file  . Stress: Not on file  Relationships  . Social connections:    Talks on phone: Not on file    Gets together: Not on file  Attends religious service: Not on file    Active member of club or organization: Not on file     Attends meetings of clubs or organizations: Not on file    Relationship status: Not on file  Other Topics Concern  . Not on file  Social History Narrative  . Not on file     Family History:  The patient's family history includes CAD in her brother and father; Dementia in her mother; Diabetes in her brother, father, other, and sister; Heart disease in her other; Hyperlipidemia in her other; Hypertension in her other; Kidney failure in her brother; Stroke in her brother, father, mother, and other.   ROS General: Negative; No fevers, chills, or night sweats;  HEENT: Negative; No changes in vision or hearing, sinus congestion, difficulty swallowing Pulmonary: Negative; No cough, wheezing, shortness of breath, hemoptysis Cardiovascular: See history of present illness GI: Rare episodes of nausea and vomiting. GU: Negative; No dysuria, hematuria, or difficulty voiding Musculoskeletal: Negative; no myalgias, joint pain, or weakness Hematologic/Oncology: Negative; no easy bruising, bleeding Endocrine: Positive for diabetes mellitus Neuro: Positive for initial CVA August 2018 with stroke extension October 2018 associated with orthostatic hypotension.  Neuropathy. Skin: Negative; No rashes or skin lesions Psychiatric: Negative; No behavioral problems, depression Sleep: Negative; No snoring, daytime sleepiness, hypersomnolence, bruxism, restless legs, hypnogognic hallucinations, no cataplexy Other comprehensive 14 point system review is negative.   PHYSICAL EXAM:   VS:  BP 137/85   Pulse 80   Ht 5' 8.5" (1.74 m)   Wt 191 lb (86.6 kg)   BMI 28.62 kg/m     Repeat blood pressure by me was 136/84.  Wt Readings from Last 3 Encounters:  03/19/18 191 lb (86.6 kg)  03/08/18 163 lb (73.9 kg)  02/01/18 163 lb (73.9 kg)    General: Alert, oriented, no distress.  Skin: normal turgor, no rashes, warm and dry HEENT: Normocephalic, atraumatic. Pupils equal round and reactive to light; sclera  anicteric; extraocular muscles intact; pharynx Nose without nasal septal hypertrophy Mouth/Parynx benign; Mallinpatti scale 3 Neck: No JVD, no carotid bruits; normal carotid upstroke Lungs: clear to ausculatation and percussion; no wheezing or rales Chest wall: without tenderness to palpitation Heart: PMI not displaced, RRR, s1 s2 normal, 1/6 systolic murmur, no diastolic murmur, no rubs, gallops, thrills, or heaves Abdomen: soft, nontender; no hepatosplenomehaly, BS+; abdominal aorta nontender and not dilated by palpation. Back: no CVA tenderness Pulses 2+ Musculoskeletal: full range of motion, normal strength, no joint deformities Extremities: no clubbing cyanosis or edema, Homan's sign negative  Neurologic: Residual left hemiparesis and left foot drop  Psychologic: Normal mood and affect   Studies/Labs Reviewed:   EKG:  EKG is ordered today.  Normal sinus rhythm at 80 bpm.  No ectopy.  Normal intervals.  December 08, 2015 ECG (independently read by me): Normal sinus rhythm at 82 bpm.  QTc interval 46 ms.  Nonspecific ST changes.  No ectopy  10/12/2017 ECG (independently read by me): Normal sinus rhythm at 76 bpm.  QTc intervals 4 9 7  ms.  PR interval 1 4 4  ms.  No ectopy.  August 24, 2017 ECG (independently read by me): normal sinus rhythm at 75 bpm.  Nonspecific ST T changes. QT interval 446; QTc interval 498 ms.   07/26/2017 ECG (independently read by me): Normal sinus rhythm at 70 bpm.  Possible left atrial enlargement.  QS complex V1 through V3  Recent Labs: BMP Latest Ref Rng & Units 01/30/2018 12/05/2017 08/24/2017  Glucose 65 -  99 mg/dL - 548(HH) 347(H)  BUN 8 - 27 mg/dL - 15 25  Creatinine 0.44 - 1.00 mg/dL 0.60 0.92 0.87  BUN/Creat Ratio 12 - 28 - 16 29(H)  Sodium 134 - 144 mmol/L - 136 140  Potassium 3.5 - 5.2 mmol/L - 4.2 4.3  Chloride 96 - 106 mmol/L - 98 102  CO2 20 - 29 mmol/L - 23 25  Calcium 8.7 - 10.3 mg/dL - 8.9 9.0     Hepatic Function Latest Ref Rng & Units  12/05/2017 08/09/2017 01/11/2017  Total Protein 6.0 - 8.5 g/dL 6.2 6.7 6.8  Albumin 3.6 - 4.8 g/dL 4.0 3.9 3.9  AST 0 - 40 IU/L 13 10 17   ALT 0 - 32 IU/L 15 10 13(L)  Alk Phosphatase 39 - 117 IU/L 100 108 62  Total Bilirubin 0.0 - 1.2 mg/dL 0.4 0.3 0.5    CBC Latest Ref Rng & Units 12/05/2017 08/24/2017 08/09/2017  WBC 3.4 - 10.8 x10E3/uL 4.6 5.3 5.0  Hemoglobin 11.1 - 15.9 g/dL 12.1 12.7 11.2  Hematocrit 34.0 - 46.6 % 38.0 39.2 34.0  Platelets 150 - 450 x10E3/uL 207 202 171   Lab Results  Component Value Date   MCV 91 12/05/2017   MCV 88 08/24/2017   MCV 86 08/09/2017   Lab Results  Component Value Date   TSH 2.560 08/09/2017   Lab Results  Component Value Date   HGBA1C 6.7 (H) 04/05/2016     BNP No results found for: BNP  ProBNP    Component Value Date/Time   PROBNP 233 12/05/2017 1413     Lipid Panel     Component Value Date/Time   CHOL 133 08/09/2017 0856   TRIG 121 08/09/2017 0856   HDL 33 (L) 08/09/2017 0856   CHOLHDL 4.0 08/09/2017 0856   CHOLHDL 5.7 04/07/2016 0317   VLDL 25 04/07/2016 0317   LDLCALC 76 08/09/2017 0856     RADIOLOGY: No results found.   Additional studies/ records that were reviewed today include:  I reviewed the patient's prior records, neurologic evaluation and evaluation from last week in addition to laboratory; cardiac catheterization findings  ASSESSMENT:    1. Coronary artery disease involving native coronary artery of native heart without angina pectoris   2. Essential hypertension   3. Type 2 diabetes mellitus with diabetic neuropathy, with long-term current use of insulin (HCC)   4. Cardiomyopathy as manifestation of underlying disease ([place])   5. 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 Hyperlipidemia with target LDL less than 70     PLAN:  Ms.Filyaw has a significant cardiovascular history notable for hypertension, hyperlipidemia, diabetes mellitus, and strong family history with multiple family members having  undergone prior CABG revascularization surgery.  She suffered an initial stroke in August 2017 and 2 months later had stroke extension resulting in residual left hemiparesis, and involvement of foot drop.  She developed symptoms suggestive of congestive heart failure and on echo Doppler was found to have systolic and diastolic dysfunction with an EF of 35-40% and grade 2 diastolic impairment.  There was mitral annular calcification with mild MR.  She had mild left atrial dilatation.  There was diffuse hypocontractility.  She has been asymptomatic with any chest pain.  Cardiac catheterization demonstrated a severe cardiomyopathy with an EF estimated at 20 to 25%.   There was mild CAD with 40% diagonal, 40% mid LAD, 40% circumflex, 50-60% mid RCA, 85% stenosis in a small caliber RV marginal branch.  PA pressure  was 71-95 mm systolically.  She was taken off lisinopril and started on Entresto.  Over several months, her dose has been titrated to maximum 97/103 mg twice a day and her diuretic was reduced to Lasix 10 mg daily.  She also has been on nadolol 6.25 mg twice a day but since her target blood pressure per neurology should be in the 130s she was only taking the carvedilol 6.25 mg once a day since her blood pressure had dropped into the 120s.  Her I reviewed her most recent echo Doppler study from February 20, 2018 which now shows normalization of LV function.  Presently she is asymptomatic with reference to CHF symptoms.  I have recommended that she increase carvedilol and at least take 3.125 mg in the morning and the 6.25 mg at night since her resting pulse is in the 80s and to avoid significant peaks and troughs with once a day dosing.  Reference to her diabetes mellitus, she may be a candidate for Jardiance which has cardiovascular benefit both on mortality as well CHF. She will be following up with Dr. Buddy Duty.  She continues to be on Zetia 10 mg and simvastatin 40 mg for hyperlipidemia.  In February 2019 her  LDL cholesterol was 76.  She will be having follow-up laboratory with Dr. Buddy Duty.  As long as she remains stable I will see her in 6 months for reevaluation or sooner if problems arise.  Medication Adjustments/Labs and Tests Ordered: Current medicines are reviewed at length with the patient today.  Concerns regarding medicines are outlined above.  Medication changes, Labs and Tests ordered today are listed in the Patient Instructions below. Patient Instructions  Medication Instructions:  Take carvedilol (Coreg) 3.125 mg in the AM and 6.25 mg in the PM  If you need a refill on your cardiac medications before your next appointment, please call your pharmacy.   Follow-Up: At [facility], you and your health needs are our priority.  As part of our continuing mission to provide you with exceptional heart care, we have created designated Provider Care Teams.  These Care Teams include your primary Cardiologist (physician) and Advanced Practice Providers (APPs -  Physician Assistants and Nurse Practitioners) who all work together to provide you with the care you need, when you need it. You will need a follow up appointment in 6 months.  Please call our office 2 months in advance to schedule this appointment.  You may see Dr. Claiborne Billings or one of the following Advanced Practice Providers on your designated Care Team: [place], [place] . Fabian Sharp, PA-C  Any Other Special Instructions Will Be Listed Below (If Applicable).       Signed, Shelva Majestic, MD  03/21/2018 7:05 PM    [facility] [address], [place], [employer], [place]  [address] Phone: [phone]

## 2018-03-19 NOTE — Patient Instructions (Signed)
Medication Instructions:  Take carvedilol (Coreg) 3.125 mg in the AM and 6.25 mg in the PM  If you need a refill on your cardiac medications before your next appointment, please call your pharmacy.   Follow-Up: At [facility], you and your health needs are our priority.  As part of our continuing mission to provide you with exceptional heart care, we have created designated Provider Care Teams.  These Care Teams include your primary Cardiologist (physician) and Advanced Practice Providers (APPs -  Physician Assistants and Nurse Practitioners) who all work together to provide you with the care you need, when you need it. You will need a follow up appointment in 6 months.  Please call our office 2 months in advance to schedule this appointment.  You may see Dr. Tresa Endo or one of the following Advanced Practice Providers on your designated Care Team: [place], [place] . Micah Flesher, PA-C  Any Other Special Instructions Will Be Listed Below (If Applicable).

## 2018-03-21 ENCOUNTER — Encounter: Payer: Self-pay | Admitting: Cardiovascular Disease

## 2018-03-27 ENCOUNTER — Telehealth: Payer: Self-pay | Admitting: Cardiovascular Disease

## 2018-03-27 NOTE — Telephone Encounter (Signed)
Left message for Maurine Minister PT to call back

## 2018-03-27 NOTE — Telephone Encounter (Signed)
I will forward to dr Tresa Endo and his nurse for review

## 2018-03-27 NOTE — Telephone Encounter (Signed)
New message:      Shelly Mcdaniel is calling from [facility] and requesting an order to continue physical therapy with the pt for 7 more visits at 2x a week for the next 3 wks and 1 more time this week. Please advise.

## 2018-03-28 NOTE — Telephone Encounter (Signed)
Left message for Shelly Mcdaniel to call back.

## 2018-04-19 ENCOUNTER — Encounter: Payer: Self-pay | Admitting: Physical Medicine & Rehabilitation

## 2018-04-19 ENCOUNTER — Ambulatory Visit (HOSPITAL_BASED_OUTPATIENT_CLINIC_OR_DEPARTMENT_OTHER): Payer: BC Managed Care – PPO | Admitting: Physical Medicine & Rehabilitation

## 2018-04-19 ENCOUNTER — Encounter: Payer: BC Managed Care – PPO | Attending: Physical Medicine & Rehabilitation

## 2018-04-19 VITALS — BP 164/92 | HR 77

## 2018-04-19 DIAGNOSIS — G8114 Spastic hemiplegia affecting left nondominant side: Secondary | ICD-10-CM

## 2018-04-19 DIAGNOSIS — G8110 Spastic hemiplegia affecting unspecified side: Secondary | ICD-10-CM | POA: Insufficient documentation

## 2018-04-19 NOTE — Progress Notes (Signed)
Botox Injection for spasticity using needle EMG guidance  Dilution: 50 Units/ml Indication: Severe spasticity which interferes with ADL,mobility and/or  hygiene and is unresponsive to medication management and other conservative care Informed consent was obtained after describing risks and benefits of the procedure with the patient. This includes bleeding, bruising, infection, excessive weakness, or medication side effects. A REMS form is on file and signed. Needle: 27g needle electrode Number of units per muscle  [id number] Biceps 100 FDS 50 FDP 50 FCR 50 All injections were done after obtaining appropriate EMG activity and after negative drawback for blood. The patient tolerated the procedure well. Post procedure instructions were given. A followup appointment was made.

## 2018-04-19 NOTE — Patient Instructions (Signed)

## 2018-05-31 ENCOUNTER — Encounter: Payer: Self-pay | Admitting: Physical Medicine & Rehabilitation

## 2018-05-31 ENCOUNTER — Ambulatory Visit (HOSPITAL_BASED_OUTPATIENT_CLINIC_OR_DEPARTMENT_OTHER): Payer: BC Managed Care – PPO | Admitting: Physical Medicine & Rehabilitation

## 2018-05-31 ENCOUNTER — Encounter: Payer: BC Managed Care – PPO | Attending: Physical Medicine & Rehabilitation

## 2018-05-31 VITALS — BP 147/80 | HR 64 | Resp 14

## 2018-05-31 DIAGNOSIS — G8110 Spastic hemiplegia affecting unspecified side: Secondary | ICD-10-CM | POA: Insufficient documentation

## 2018-05-31 DIAGNOSIS — G8114 Spastic hemiplegia affecting left nondominant side: Secondary | ICD-10-CM

## 2018-05-31 NOTE — Patient Instructions (Signed)
Remember to bring Ice packs

## 2018-05-31 NOTE — Progress Notes (Signed)
Subjective:    Patient ID: Shelly Mcdaniel, female    DOB: Jan 25, 1956, 62 y.o.   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  She does ambulate short distances with stand-up walker.  Was ambulating more frequently while going through physical therapy up to 900 feet per her report.  The patient has had no new falls or other new medical issues.  She is currently ambulating only short household distances.  Uses scooter and manual wheelchair. Last botulinum toxin injection approximately 6 weeks ago [email or web address] Biceps 100 FDS 50 FDP 50 FCR 50  Shoulder pain- improved Pain Inventory Average Pain 7 Pain Right Now 2 My pain is ?  In the last 24 hours, has pain interfered with the following? General activity 0 Relation with others 0 Enjoyment of life 0 What TIME of day is your pain at its worst? varies Sleep (in general) Good  Pain is worse with: some activites Pain improves with: rest Relief from Meds: ?  Mobility use a wheelchair  Function retired  Neuro/Psych weakness  Prior Studies Any changes since last visit?  no  Physicians involved in your care Any changes since last visit?  no   Family History  Problem Relation Age of Onset  . Stroke Mother   . Dementia Mother   . Diabetes Father   . Stroke Father   . CAD Father   . Diabetes Sister   . Diabetes Brother   . CAD Brother   . Stroke Brother   . Kidney failure Brother   . Hypertension Other   . Hyperlipidemia Other   . Stroke Other   . Heart disease Other   . Diabetes Other    Social History   Socioeconomic History  . Marital status: Married    Spouse name: Not on file  . Number of children: Not on file  . Years of education: Not on file  . Highest education level: Not on file  Occupational History  . Not on file  Social Needs  . Financial resource strain: Not on file  . Food insecurity:    Worry: Not on  file    Inability: Not on file  . Transportation needs:    Medical: Not on file    Non-medical: Not on file  Tobacco Use  . Smoking status: Never Smoker  . Smokeless tobacco: Never Used  Substance and Sexual Activity  . Alcohol use: No    Alcohol/week: 0.0 standard drinks  . Drug use: No  . Sexual activity: Not on file  Lifestyle  . Physical activity:    Days per week: Not on file    Minutes per session: Not on file  . Stress: Not on file  Relationships  . Social connections:    Talks on phone: Not on file    Gets together: Not on file    Attends religious service: Not on file    Active member of club or organization: Not on file    Attends meetings of clubs or organizations: Not on file    Relationship status: Not on file  Other Topics Concern  . Not on file  Social History Narrative  . Not on file   Past Surgical History:  Procedure Laterality Date  . BACK SURGERY    . BREAST SURGERY    . COLONOSCOPY WITH PROPOFOL N/A 12/24/2013   Procedure: COLONOSCOPY WITH PROPOFOL;  Surgeon: Charolett BumpersMartin K Johnson, MD;  Location: [facility];  Service: Endoscopy;  Laterality: N/A;  . RIGHT/LEFT HEART CATH AND CORONARY ANGIOGRAPHY N/A 08/10/2017   Procedure: RIGHT/LEFT HEART CATH AND CORONARY ANGIOGRAPHY;  Surgeon: Lennette BihariKelly, Thomas A, MD;  Location: [facility];  Service: Cardiovascular;  Laterality: N/A;   Past Medical History:  Diagnosis Date  . Diabetes mellitus without complication (HCC)   . Hyperlipidemia   . Hypertension   . Stroke (HCC)   . UTI (urinary tract infection)    BP (!) 147/80   Pulse 64   SpO2 95%   Opioid Risk Score:   Fall Risk Score:  `1  Depression screen PHQ 2/9  [facility] 2/9 03/08/2018 12/18/2017 10/12/2017 07/14/2016 05/12/2016  Decreased Interest 0 0 0 0 3  Down, Depressed, Hopeless 0 0 0 0 0  PHQ - 2 Score 0 0 0 0 3  Altered sleeping - - - - 2  Tired, decreased energy - - - - 3  Feeling bad or failure about yourself  - - - - 0  Trouble  concentrating - - - - 3  Moving slowly or fidgety/restless - - - - 2  Suicidal thoughts - - - - 0  PHQ-9 Score - - - - 13  Difficult doing work/chores - - - - Very difficult  Some recent data might be hidden    Review of Systems  Constitutional: Negative.   HENT: Negative.   Eyes: Negative.   Respiratory: Negative.   Cardiovascular: Negative.   Gastrointestinal: Negative.   Endocrine: Negative.   Genitourinary: Negative.   Musculoskeletal: Positive for gait problem.  Skin: Negative.   Allergic/Immunologic: Negative.   Neurological: Positive for weakness.  Psychiatric/Behavioral: Negative.   All other systems reviewed and are negative.      Objective:   Physical Exam Vitals signs and nursing note reviewed.  Constitutional:      Appearance: Normal appearance.  HENT:     Head: Normocephalic and atraumatic.  Eyes:     Extraocular Movements: Extraocular movements intact.     Pupils: Pupils are equal, round, and reactive to light.  Neurological:     General: No focal deficit present.     Mental Status: She is alert and oriented to person, place, and time.  Psychiatric:        Mood and Affect: Mood normal.   Motor strength is 3- at the bicep to minus finger flexors trace extensors to minus left deltoid, 3- left hip knee extensor synergy left ankle in AFO Gait not tested does not have her assistive device with her. Tone MAS 1 at the finger and wrist flexors as well as elbow flexors on the left side, left thumb flexor/FPL MAS 3        Assessment & Plan:  1.  Chronic right pontine infarct with left hemiparesis and spasticity.  She does get good benefit with botulinum toxin injection.  We will continue same dosing and muscle group selection with the exception listed below  Add FPL 50U Biceps 150  [email or web address] FDS 50 FDP 50 FCR 50  2.  Gait disturbance following stroke, has declined in terms of her functional mobility.  She is in the wheelchair more and not using  her standard walker.  Will make referral to home health therapy.

## 2018-06-11 ENCOUNTER — Telehealth: Payer: Self-pay | Admitting: Physical Medicine & Rehabilitation

## 2018-06-11 NOTE — Telephone Encounter (Signed)
This was completed 06/08/2018

## 2018-06-11 NOTE — Telephone Encounter (Signed)
Husband [name] called office and states when AK ordered PT they [facility] - [facility]  His number is [phone]-- for confirmation

## 2018-06-21 DIAGNOSIS — Z9181 History of falling: Secondary | ICD-10-CM

## 2018-06-21 DIAGNOSIS — E119 Type 2 diabetes mellitus without complications: Secondary | ICD-10-CM

## 2018-06-21 DIAGNOSIS — Z87440 Personal history of urinary (tract) infections: Secondary | ICD-10-CM

## 2018-06-21 DIAGNOSIS — Z993 Dependence on wheelchair: Secondary | ICD-10-CM

## 2018-06-21 DIAGNOSIS — I69354 Hemiplegia and hemiparesis following cerebral infarction affecting left non-dominant side: Secondary | ICD-10-CM

## 2018-06-21 DIAGNOSIS — E785 Hyperlipidemia, unspecified: Secondary | ICD-10-CM

## 2018-06-21 DIAGNOSIS — I509 Heart failure, unspecified: Secondary | ICD-10-CM

## 2018-06-21 DIAGNOSIS — I110 Hypertensive heart disease with heart failure: Secondary | ICD-10-CM

## 2018-06-27 ENCOUNTER — Telehealth: Payer: Self-pay

## 2018-06-27 NOTE — Telephone Encounter (Signed)
Maurine Minister, PT from [facility] called stating pt is being discharged from home therapies due to non-complaint with treatment program, home exercise program and being home for schedule visit.

## 2018-07-12 ENCOUNTER — Encounter: Payer: BC Managed Care – PPO | Attending: Physical Medicine & Rehabilitation

## 2018-07-12 ENCOUNTER — Ambulatory Visit: Payer: BC Managed Care – PPO | Admitting: Physical Medicine & Rehabilitation

## 2018-07-12 DIAGNOSIS — G8110 Spastic hemiplegia affecting unspecified side: Secondary | ICD-10-CM | POA: Insufficient documentation

## 2018-07-26 ENCOUNTER — Ambulatory Visit: Payer: BC Managed Care – PPO | Admitting: Physical Medicine & Rehabilitation

## 2018-08-03 ENCOUNTER — Ambulatory Visit: Payer: Medicare Other | Admitting: Physical Medicine & Rehabilitation

## 2018-08-14 ENCOUNTER — Encounter: Payer: Medicare Other | Attending: Physical Medicine & Rehabilitation

## 2018-08-14 ENCOUNTER — Ambulatory Visit (HOSPITAL_BASED_OUTPATIENT_CLINIC_OR_DEPARTMENT_OTHER): Payer: Medicare Other | Admitting: Physical Medicine & Rehabilitation

## 2018-08-14 ENCOUNTER — Other Ambulatory Visit: Payer: Self-pay

## 2018-08-14 ENCOUNTER — Encounter: Payer: Self-pay | Admitting: Physical Medicine & Rehabilitation

## 2018-08-14 VITALS — BP 166/106 | HR 77 | Ht 68.5 in | Wt 180.0 lb

## 2018-08-14 DIAGNOSIS — G8114 Spastic hemiplegia affecting left nondominant side: Secondary | ICD-10-CM

## 2018-08-14 DIAGNOSIS — G8110 Spastic hemiplegia affecting unspecified side: Secondary | ICD-10-CM | POA: Insufficient documentation

## 2018-08-14 NOTE — Progress Notes (Signed)
Botox Injection for spasticity using needle EMG guidance  Dilution: 50 Units/ml Indication: Severe spasticity which interferes with ADL,mobility and/or  hygiene and is unresponsive to medication management and other conservative care Informed consent was obtained after describing risks and benefits of the procedure with the patient. This includes bleeding, bruising, infection, excessive weakness, or medication side effects. A REMS form is on file and signed. Needle: 25g 2" needle electrode Number of units per muscle  FPL 50U Biceps 150  [email or web address] FDS 50 FDP 50 FCR 50 All injections were done after obtaining appropriate EMG activity and after negative drawback for blood. The patient tolerated the procedure well. Post procedure instructions were given. A followup appointment was made.

## 2018-08-14 NOTE — Patient Instructions (Signed)
You received a Botox injection today. You may experience soreness at the needle injection sites. Please call us if any of the injection sites turns red after a couple days or if there is any drainage. You may experience muscle weakness as a result of Botox. This would improve with time but can take several weeks to improve. The Botox should start working in about one week. The Botox usually last 3 months. The injection can be repeated every 3 months as needed.  FPL 50U Biceps 150  [email or web address] FDS 50 FDP 50 FCR 50

## 2018-08-17 ENCOUNTER — Other Ambulatory Visit: Payer: Self-pay | Admitting: Cardiovascular Disease

## 2018-08-31 ENCOUNTER — Telehealth: Payer: Self-pay

## 2018-08-31 NOTE — Telephone Encounter (Signed)
Received notification that Shelly Mcdaniel was approved from 08/31/2018-08/31/2019.  Letter sent to scan.

## 2018-09-16 ENCOUNTER — Other Ambulatory Visit: Payer: Self-pay | Admitting: Cardiovascular Disease

## 2018-09-17 NOTE — Telephone Encounter (Signed)
Entresto refilled. 

## 2018-09-25 ENCOUNTER — Encounter: Payer: Self-pay | Admitting: Physical Medicine & Rehabilitation

## 2018-09-25 ENCOUNTER — Encounter: Payer: Medicare Other | Attending: Physical Medicine & Rehabilitation

## 2018-09-25 ENCOUNTER — Ambulatory Visit (HOSPITAL_BASED_OUTPATIENT_CLINIC_OR_DEPARTMENT_OTHER): Payer: Medicare Other | Admitting: Physical Medicine & Rehabilitation

## 2018-09-25 ENCOUNTER — Other Ambulatory Visit: Payer: Self-pay

## 2018-09-25 VITALS — BP 127/82 | HR 86 | Ht 68.5 in | Wt 180.0 lb

## 2018-09-25 DIAGNOSIS — I6350 Cerebral infarction due to unspecified occlusion or stenosis of unspecified cerebral artery: Secondary | ICD-10-CM

## 2018-09-25 DIAGNOSIS — G8110 Spastic hemiplegia affecting unspecified side: Secondary | ICD-10-CM | POA: Insufficient documentation

## 2018-09-25 DIAGNOSIS — G8114 Spastic hemiplegia affecting left nondominant side: Secondary | ICD-10-CM

## 2018-09-25 NOTE — Progress Notes (Signed)
Subjective:    Patient ID: Shelly Mcdaniel, female    DOB: [birth year], 63 y.o.   MRN: [id number] Webex today Covid risk 5 HPI 3/3 FPL 50U Biceps 150  [email or web address] FDS50 FDP 50 FCR 50 Able to move thumb better Not doing shoulder stretch  No falls No new problems  Pt doing stretching for hand and wrist Riding exercise bike, 15min per day aiming to try twice a day Able to go up and down 6 steps Pain Inventory Average Pain 0 Pain Right Now 0 My pain is weakness  In the last 24 hours, has pain interfered with the following? General activity 0 Relation with others 0 Enjoyment of life 0 What TIME of day is your pain at its worst? na Sleep (in general) Good  Pain is worse with: na Pain improves with: na Relief from Meds: na  Mobility use a wheelchair  Function disabled: date disabled na I need assistance with the following:  dressing, meal prep, household duties and shopping  Neuro/Psych weakness  Prior Studies Any changes since last visit?  no  Physicians involved in your care Any changes since last visit?  no   Family History  Problem Relation Age of Onset  . Stroke Mother   . Dementia Mother   . Diabetes Father   . Stroke Father   . CAD Father   . Diabetes Sister   . Diabetes Brother   . CAD Brother   . Stroke Brother   . Kidney failure Brother   . Hypertension Other   . Hyperlipidemia Other   . Stroke Other   . Heart disease Other   . Diabetes Other    Social History   Socioeconomic History  . Marital status: Married    Spouse name: Not on file  . Number of children: Not on file  . Years of education: Not on file  . Highest education level: Not on file  Occupational History  . Not on file  Social Needs  . Financial resource strain: Not on file  . Food insecurity:    Worry: Not on file    Inability: Not on file  . Transportation needs:    Medical: Not on file    Non-medical: Not on file  Tobacco Use  . Smoking status:  Never Smoker  . Smokeless tobacco: Never Used  Substance and Sexual Activity  . Alcohol use: No    Alcohol/week: 0.0 standard drinks  . Drug use: No  . Sexual activity: Not on file  Lifestyle  . Physical activity:    Days per week: Not on file    Minutes per session: Not on file  . Stress: Not on file  Relationships  . Social connections:    Talks on phone: Not on file    Gets together: Not on file    Attends religious service: Not on file    Active member of club or organization: Not on file    Attends meetings of clubs or organizations: Not on file    Relationship status: Not on file  Other Topics Concern  . Not on file  Social History Narrative  . Not on file   Past Surgical History:  Procedure Laterality Date  . BACK SURGERY    . BREAST SURGERY    . COLONOSCOPY WITH PROPOFOL N/A 12/24/2013   Procedure: COLONOSCOPY WITH PROPOFOL;  Surgeon: Charolett BumpersMartin K Johnson, MD;  Location: [facility];  Service: Endoscopy;  Laterality: N/A;  . RIGHT/LEFT HEART CATH AND CORONARY  ANGIOGRAPHY N/A 08/10/2017   Procedure: RIGHT/LEFT HEART CATH AND CORONARY ANGIOGRAPHY;  Surgeon: Lennette Bihari, MD;  Location: [facility];  Service: Cardiovascular;  Laterality: N/A;   Past Medical History:  Diagnosis Date  . Diabetes mellitus without complication (HCC)   . Hyperlipidemia   . Hypertension   . Stroke (HCC)   . UTI (urinary tract infection)    BP 127/82 Comment: pt reported, virutal visit  Pulse 86 Comment: pt reported, virutal visit  Ht 5' 8.5" (1.74 m)   Wt 180 lb (81.6 kg)   SpO2 94% Comment: pt reported, virutal visit  BMI 26.97 kg/m   Opioid Risk Score:   Fall Risk Score:  `1  Depression screen PHQ 2/9  Depression screen [facility] 2/9 03/08/2018 12/18/2017 10/12/2017 07/14/2016 05/12/2016  Decreased Interest 0 0 0 0 3  Down, Depressed, Hopeless 0 0 0 0 0  PHQ - 2 Score 0 0 0 0 3  Altered sleeping - - - - 2  Tired, decreased energy - - - - 3  Feeling bad or failure about yourself   - - - - 0  Trouble concentrating - - - - 3  Moving slowly or fidgety/restless - - - - 2  Suicidal thoughts - - - - 0  PHQ-9 Score - - - - 13  Difficult doing work/chores - - - - Very difficult  Some recent data might be hidden    Review of Systems  Constitutional: Negative.   HENT: Positive for sneezing.   Eyes: Negative.   Respiratory: Negative.   Cardiovascular: Negative.   Gastrointestinal: Positive for diarrhea and nausea.  Endocrine: Negative.   Genitourinary: Negative.   Musculoskeletal: Positive for myalgias.  Skin: Negative.   Allergic/Immunologic: Negative.   Neurological: Positive for weakness and headaches.  Psychiatric/Behavioral: Negative.   All other systems reviewed and are negative.      Objective:   Physical Exam WEB EX Speech-mild dysarthria Video not functioning      Assessment & Plan:  #1.  Pontine infarct with left spastic hemiplegia, improved after botulinum toxin injection.  A total of 400 units were injected.  Based on response, would repeat same dosing and same muscle group selection.  This can be repeated in early June. We also discussed the importance of keeping up with stretching as well as her home exercise program which includes stationary bicycling. Also advised patient that neuro rehab outpatient program is starting to see patients again.  Patient instructed to call over to check for appointment

## 2018-09-26 ENCOUNTER — Telehealth: Payer: Self-pay | Admitting: Cardiovascular Disease

## 2018-09-26 NOTE — Telephone Encounter (Signed)
LVM to schedule recall appt.

## 2018-10-15 ENCOUNTER — Ambulatory Visit: Payer: Medicare Other | Admitting: Rehabilitative and Restorative Service Providers"

## 2018-10-16 ENCOUNTER — Ambulatory Visit: Payer: Medicare Other | Attending: Physical Medicine & Rehabilitation | Admitting: Physical Therapy

## 2018-10-16 ENCOUNTER — Ambulatory Visit: Payer: Medicare Other | Admitting: Occupational Therapy

## 2018-10-16 ENCOUNTER — Other Ambulatory Visit: Payer: Self-pay

## 2018-10-16 ENCOUNTER — Encounter: Payer: Self-pay | Admitting: Physical Therapy

## 2018-10-16 ENCOUNTER — Encounter: Payer: Self-pay | Admitting: Occupational Therapy

## 2018-10-16 DIAGNOSIS — R2689 Other abnormalities of gait and mobility: Secondary | ICD-10-CM | POA: Diagnosis present

## 2018-10-16 DIAGNOSIS — M6281 Muscle weakness (generalized): Secondary | ICD-10-CM | POA: Diagnosis not present

## 2018-10-16 DIAGNOSIS — M25642 Stiffness of left hand, not elsewhere classified: Secondary | ICD-10-CM

## 2018-10-16 DIAGNOSIS — R293 Abnormal posture: Secondary | ICD-10-CM | POA: Insufficient documentation

## 2018-10-16 DIAGNOSIS — R29898 Other symptoms and signs involving the musculoskeletal system: Secondary | ICD-10-CM | POA: Insufficient documentation

## 2018-10-16 DIAGNOSIS — I69354 Hemiplegia and hemiparesis following cerebral infarction affecting left non-dominant side: Secondary | ICD-10-CM | POA: Diagnosis present

## 2018-10-16 DIAGNOSIS — M25612 Stiffness of left shoulder, not elsewhere classified: Secondary | ICD-10-CM

## 2018-10-16 DIAGNOSIS — M25632 Stiffness of left wrist, not elsewhere classified: Secondary | ICD-10-CM | POA: Diagnosis present

## 2018-10-16 DIAGNOSIS — R2681 Unsteadiness on feet: Secondary | ICD-10-CM

## 2018-10-16 NOTE — Patient Instructions (Signed)
PERFORM ALL SLOWLY AND ONLY AS FAR AS YOU CAN WITHOUT PAIN!     Table Top Stretch (Static)    Sitting at table, slide arm across table using other arm to help if needed. Hold 5 seconds. Repeat 10 times then perform side-to-side, and in circles. Do 1-2 sessions per day.    Extension (Passive)    Using other hand, lift hand at wrist as far as possible. Hold 10 seconds. Repeat 10 times. Do 1-2 sessions per day.     FINGERS: Extension    Use opposite hand to straighten fingers. Do not bend knuckles backwards. Hold 10 seconds. 10 reps per set, 1-2 times per day    Supination (Passive)    Keep elbow bent at right angle and held firmly at side. Use other hand to turn forearm until palm faces upward. Hold 10 seconds. Repeat 10 times. Do 1-2 sessions per day.    Paper Cup Grasp    Gently pick up a paper cup by grasping it over the top with involved hand. Release. Repeat 10  times. Do 1-2 sessions per day.

## 2018-10-16 NOTE — Patient Instructions (Signed)
1)  Work on increasing your wear time of your AFO (start with 30 minutes to one hour per day). When you take off the AFO, have your husband look at your skin.  As long as there are no reddened areas, you can increase the wear time by 15-30 minutes each day.  YOUR GOAL IS TO WEAR YOUR AFO ALL DAY.  This should give you more stability for standing and transfers.  2)  HELP as much as you safely can with transfers, with your husband.  (You should be doing 50%/he should be doing 50%).  3)STAND at the sink for 2 minutes, 3 times per day (around mealtimes).  Have the wheelchair locked behind you and husband standing to help for support.  Stand as tall as you can.

## 2018-10-17 ENCOUNTER — Telehealth: Payer: Self-pay | Admitting: Cardiovascular Disease

## 2018-10-17 NOTE — Therapy (Signed)
[facility] [address] [place], [place], [address] Phone: [phone]   Fax:  [phone]  Occupational Therapy Evaluation  Patient Details  Name: Shelly Mcdaniel MRN: [id number] Date of Birth: [birth year] Referring Provider (OT): Dr. Claudette LawsAndrew Kirsteins   Encounter Date: 10/16/2018  OT End of Session - 10/16/18 1436    Visit Number  1    Number of Visits  9    Date for OT Re-Evaluation  12/15/18    Authorization Type  10/16/36-7/5/645/5/20-01/15/19 cert.  Medicare / BCBS    Authorization - Visit Number  1    Authorization - Number of Visits  10    OT Start Time  1020    OT Stop Time  1100    OT Time Calculation (min)  40 min    Activity Tolerance  Patient tolerated treatment well    Behavior During Therapy  Flat affect       Past Medical History:  Diagnosis Date  . Diabetes mellitus without complication (HCC)   . Hyperlipidemia   . Hypertension   . Stroke (HCC)   . UTI (urinary tract infection)     Past Surgical History:  Procedure Laterality Date  . BACK SURGERY    . BREAST SURGERY    . COLONOSCOPY WITH PROPOFOL N/A 12/24/2013   Procedure: COLONOSCOPY WITH PROPOFOL;  Surgeon: Charolett BumpersMartin K Johnson, MD;  Location: [facility];  Service: Endoscopy;  Laterality: N/A;  . RIGHT/LEFT HEART CATH AND CORONARY ANGIOGRAPHY N/A 08/10/2017   Procedure: RIGHT/LEFT HEART CATH AND CORONARY ANGIOGRAPHY;  Surgeon: Lennette BihariKelly, Thomas A, MD;  [facility]: [facility];  Service: Cardiovascular;  Laterality: N/A;    There were no vitals filed for this visit.  Subjective Assessment - 10/16/18 1028    Subjective   Pt reports stretching arm several times a week.  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  PMH:  HTN, DM, orthostatic hypotension, hx of adhesive capsulitis L shoulder, HDL,  cardiomyopathy, s/p Botox 08/14/18.      Limitations  fall risk    Patient Stated Goals  to be able to move L hand and squeeze stronger    Currently in Pain?  Other (Comment)   only stretching pain if move LUE too far       [facility] - 10/16/18 1031      Assessment   Medical Diagnosis  spastic hemiparesis L nondominant side    Referring Provider (OT)  Dr. Claudette LawsAndrew Kirsteins    Onset Date/Surgical Date  08/14/18   last Botox    Hand Dominance  Right    Prior Therapy  last OT 12/2016      Precautions   Precautions  Fall      Balance Screen   Has the patient fallen in the past 6 months  No      Home  Environment   Family/patient expects to be discharged to:  Private residence    Lives With  Spouse      Prior Function   Level of Independence  Needs assistance with ADLs;Needs assistance with gait;Needs assistance with transfers   mod A      ADL   Eating/Feeding  Set up    Grooming  Set up    Upper Body Bathing  Supervision/safety  Lower Body Bathing  Supervision/safety    Upper Body Dressing  Minimal assistance    Lower Body Dressing  Moderate assistance   uses bedrail to assist with stand   Toilet Transfer  Moderate assistance    Toileting - Clothing Manipulation  Moderate assistance    Toileting -  Hygiene  Supervision/safety    Tub/Shower Transfer  Supervision/safety   roll in shower   Transfers/Ambulation Related to ADL's  mod A      Mobility   Mobility Status  Needs assist    Mobility Status Comments  was walking 1-2/week with upright walker with assistance, but not now.--see PT eval for details.  Per PT, unable to get LUE positioned properly for upwalker use due to spasticity (decr supination, finger ext, and shoulder ER)      Vision Assessment   Vision Assessment  Vision not tested   hx of L inattention     Cognition   Overall Cognitive Status  Impaired/Different from baseline   cognitive deficits since CVA   Area of Impairment  Awareness;Memory     Memory  Decreased short-term memory    Cognition Comments  hx of decr attention, decr STM, and decr awareness      Sensation   Light Touch  Impaired by gross assessment   LUE     Coordination   Gross Motor Movements are Fluid and Coordinated  No    Fine Motor Movements are Fluid and Coordinated  No    Other  only placing LUE on top of paper when writing       Tone   Assessment Location  Left Upper Extremity      ROM / Strength   AROM / PROM / Strength  AROM;PROM      AROM   Overall AROM   Deficits    Overall AROM Comments  only able to initiate finger flex, pt reports that before last Botox, she was able to extend thumb some      PROM   Overall PROM   Deficits    Overall PROM Comments  LUE positioned in flex synergy pattern.  Pt with finger ext grossly WFL, wrist ext approx 50%, supination 25-40%, elbow ext to approx -50*, shoulder flex to approx 45*, ER to approx 40%.        Hand Function   Left Hand Gross Grasp  Impaired      LUE Tone   LUE Tone  Moderate                      OT Education - 10/16/18 1435    Education Details  OT eval results/POC; Instruction in benefits of splinting for spasticity management; Initial HEP--see pt instructions    Person(s) Educated  Patient;Spouse    Methods  Explanation;Demonstration;Verbal cues;Handout    Comprehension  Verbalized understanding;Returned demonstration;Verbal cues required;Need further instruction       OT Short Term Goals - 10/17/18 1253      OT SHORT TERM GOAL #1   Title  Pt will be independnt with updated splint wear/care for spasticity management and improved positioning.--check STGs 11/15/18    Time  4    Period  Weeks    Status  New      OT SHORT TERM GOAL #2   Title  Pt/family to verbalize understanding with proper positioning of LUE during activity and in bed to decr risk of future complications including pain.    Time  4  Period  Weeks    Status  New      OT SHORT TERM GOAL #3   Title   ----      OT SHORT TERM GOAL #4   Title  ----      OT SHORT TERM GOAL #5   Title  ----        OT Long Term Goals - 10/17/18 1255      OT LONG TERM GOAL #1   Title  Pt/ caregiver will be independent with updated HEP--check LTGs 12/15/18    Time  8    Period  Weeks    Status  New      OT LONG TERM GOAL #2   Title  Pt will demo improved PROM LUE to be able to position LUE appropriately on up walker in prep for short distance ambulation.    Time  8    Period  Weeks    Status  New      OT LONG TERM GOAL #3   Title  Pt will demo at least 80* L shoulder flex without pain for incr ease/safety with hygiene and UB dressing.    Time  8    Period  Weeks    Status  New      OT LONG TERM GOAL #4   Title  Pt will be able to use LUE as stabilizer for selected ADL/IADL tasks.    Time  8    Period  Weeks    Status  New      OT LONG TERM GOAL #5   Title  ---            Plan - 10/16/18 1439    Clinical Impression Statement  Pt is a 63 y.o. female with hx of chronic R pontine infarct with L hemiparesis and spasticity.  Last received Botox to LUE 08/14/18.  Pt/husband reports decline in strength, functional mobility overall since 03/2018.  Pt with PMH that includes:  HTN, DM, orthostatic hypotension, hx of adhesive capsulitis L shoulder, HDL, cardiomyopathy.  Pt presents to occupational therapy with L spastic hemiplegia with decr A/PROM LUE and decr LUE functional use, and spasticity.  Pt would benefit from occupational therapy to address decr LUE ROM/positioning for walker use, decr risk of future complications, and spasticity management.    OT Occupational Profile and History  Detailed Assessment- Review of Records and additional review of physical, cognitive, psychosocial history related to current functional performance    Occupational performance deficits (Please refer to evaluation for details):  ADL's;IADL's;Leisure;Social Participation    Body Structure / Function / Physical Skills   ADL;GMC;UE functional use;Coordination;IADL;Pain;Strength;Improper spinal/pelvic alignment;Body mechanics;Decreased knowledge of use of DME;Flexibility;Mobility;ROM;Tone    Rehab Potential  Fair    Clinical Decision Making  Several treatment options, min-mod task modification necessary    Comorbidities Affecting Occupational Performance:  Presence of comorbidities impacting occupational performance    Comorbidities impacting occupational performance description:  hx of adhesive capsulitis L shoulder, orthostatic hypotension, cardiomyopathy    Modification or Assistance to Complete Evaluation   Min-Moderate modification of tasks or assist with assess necessary to complete eval    OT Frequency  1x / week    OT Duration  8 weeks   +eval over 12 weeks    OT Treatment/Interventions  Self-care/ADL training;Therapeutic exercise;Patient/family education;Splinting;Neuromuscular education;Moist Heat;Fluidtherapy;Functional Mobility Training;Energy conservation;Therapeutic activities;Cryotherapy;Ultrasound;Contrast Bath;DME and/or AE instruction;Manual Therapy;Passive range of motion    Plan  check splint and modify prn/fabricate custom resting hand splint if  needed and instruct in wear/care schedule; review HEP if time    OT Home Exercise Plan  Education provided:  initial HEP issued 10/16/18    Recommended Other Services  current with PT     Consulted and Agree with Plan of Care  Patient;Family member/caregiver    Family Member Consulted  husband       Patient will benefit from skilled therapeutic intervention in order to improve the following deficits and impairments:  Body Structure / Function / Physical Skills  Visit Diagnosis: Hemiplegia and hemiparesis following cerebral infarction affecting left non-dominant side (HCC)  Other symptoms and signs involving the musculoskeletal system  Abnormal posture  Other abnormalities of gait and mobility  Stiffness of left shoulder, not elsewhere  classified  Stiffness of left hand, not elsewhere classified  Stiffness of left wrist, not elsewhere classified    Problem List Patient Active Problem List   Diagnosis Date Noted  . Cardiomyopathy (HCC)   . Coronary artery disease involving native coronary artery of native heart without angina pectoris   . Ischemic cardiomyopathy 07/04/2017  . 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 Orthostatic hypotension 04/08/2016  . Muscle cramps 04/08/2016  . 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 Cerebral thrombosis with cerebral infarction 04/06/2016  . Basilar artery stenosis   . Dysarthria 04/05/2016  . History of CVA with residual deficit 04/05/2016  . Sinus tachycardia 04/05/2016  . Acute encephalopathy 04/05/2016  . Chronic pain 04/05/2016  . Other hyperlipidemia 04/05/2016  . Ischemic stroke (HCC)   . 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 TIA (transient ischemic attack) 11/07/2014  . Uncontrolled hypertension 11/07/2014  . Controlled type 2 diabetes mellitus with diabetic nephropathy (HCC) 11/07/2014    [facility] 10/17/2018, 1:33 PM  [facility] [address] [place], [place], [address] Phone: [phone]   Fax:  [phone]  Name: Shelly Mcdaniel MRN: [id number] Date of Birth: 1955-10-01   non fasting

## 2018-10-17 NOTE — Telephone Encounter (Signed)
My Chart message sent

## 2018-10-17 NOTE — Therapy (Addendum)
[facility] [address] [place], [place], [address] Phone: [phone]   Fax:  [phone]  Physical Therapy Evaluation  Patient Details  Name: Shelly Mcdaniel MRN: [id number] Date of Birth: 05-06-1956 Referring Provider (PT): Kirsteins   Encounter Date: 10/16/2018  CLINIC OPERATION CHANGES: [facility] is operating at a low capacity due to COVID-19.  The patient was brought into the clinic for evaluation and/or treatment following universal masking by staff, social distancing, and <10 people in the clinic.  The patient's COVID risk of complications score is 5.  PT End of Session - 10/17/18 0833    Visit Number  1    Number of Visits  8    Date for PT Re-Evaluation  01/14/19    Authorization Type  Medicare/BCBS    PT Start Time  1108    PT Stop Time  1150    PT Time Calculation (min)  42 min    Equipment Utilized During Treatment  Gait belt    Activity Tolerance  Patient tolerated treatment well    Behavior During Therapy  Flat affect       Past Medical History:  Diagnosis Date  . Diabetes mellitus without complication (HCC)   . Hyperlipidemia   . Hypertension   . Stroke (HCC)   . UTI (urinary tract infection)     Past Surgical History:  Procedure Laterality Date  . BACK SURGERY    . BREAST SURGERY    . COLONOSCOPY WITH PROPOFOL N/A 12/24/2013   Procedure: COLONOSCOPY WITH PROPOFOL;  Surgeon: Charolett Bumpers, MD;  Location: [facility];  Service: Endoscopy;  Laterality: N/A;  . RIGHT/LEFT HEART CATH AND CORONARY ANGIOGRAPHY N/A 08/10/2017   Procedure: RIGHT/LEFT HEART CATH AND CORONARY ANGIOGRAPHY;  Surgeon: Lennette Bihari, MD;  [facility]: [facility];  Service: Cardiovascular;  Laterality: N/A;    There were no vitals filed for this visit.   Subjective Assessment - 10/16/18 1113    Subjective  Doing pretty good.  Brought my Up-Walker.  I was able to walk down the aisle  for my daughter's wedding with my son and husband in September 2019.  After the wedding, I was very sick and stopped the progress of my walking.  Haven't really walked since December.    Patient is accompained by:  Family member   Husband   Pertinent History  HTN, DM, Hyperlipidemia, CVA (01/2016, 03/2016), hx of UTI, cardiomyopathy, CHF    Limitations  Standing;Walking    Patient Stated Goals  Pt's goals for physical therapy are to improve transfers, building strength back in legs.    Currently in Pain?  No/denies         [facility] PT Assessment - 10/16/18 1117      Assessment   Medical Diagnosis  spastic hemiparesis L nondominant side    Referring Provider (PT)  Kirsteins    Onset Date/Surgical Date  08/14/18   last Botox   Hand Dominance  Right    Prior Therapy  last OPPT ended 2018, except 2019 visits for BPPV and power w/c eval; had HHPT ending 06/2018      Precautions   Precautions  Fall    Precaution Comments  L AFO      Balance Screen   Has the patient fallen in the past 6 months  Yes    How many times?  1    Has the patient had a decrease in activity level because of a  fear of falling?   Yes    Is the patient reluctant to leave their home because of a fear of falling?   No      Home Environment   Living Environment  Private residence    Living Arrangements  Spouse/significant other    Available Help at Discharge  Family;Available 24 hours/day    Type of Home  House    Home Access  Ramped entrance    Home Layout  One level;Able to live on main level with bedroom/bathroom   townhome is one level-they will move in late-May   Home Equipment  Wheelchair - manual;Wheelchair - power   [employer]   Additional Comments  Wears L AFO      Prior Function   Level of Independence  Needs assistance with ADLs;Needs assistance with gait;Needs assistance with transfers    Comments  At end of OPPT 12/2016, following initial CVAs, pt was min guard/supervision with transfers and was able to  ambulate 115 ft with [facility].      Observation/Other Assessments   Focus on Therapeutic Outcomes (FOTO)   NA      ROM / Strength   AROM / PROM / Strength  PROM;Strength      PROM   Overall PROM   Deficits    Overall PROM Comments  In sitting:  P/ROM for L knee extension:  lacks 42 degrees full extension; for RLE dorsiflexion 8 degrees beyond neutral      Strength   Overall Strength  Deficits    Overall Strength Comments  LLE in flexion syngery pattern when MMT attempted.  L hip flexion 4/5, L knee extension 3-/5; L ankle dorsiflexion 0/5.  RLE grossly tested throughout:  4/5, with pain noted in R knee with knee extension.      Transfers   Transfers  Sit to Stand;Stand to [employer] Transfers    Sit to Stand  3: Mod assist;With upper extremity assist;From bed    Sit to Stand Details (indicate cue type and reason)  From mat to stand at locked UPWalker, pt uses lower bar on UPWalker to push up to stand    Stand to Sit  3: Mod assist;With upper extremity assist;To bed    Stand Pivot Transfers  3: Mod assist   wheelchair<>mat   Stand Pivot Transfer Details (indicate cue type and reason)  cues for hand placement      Balance   Balance Assessed  Yes      Static Standing Balance   Static Standing - Balance Support  Right upper extremity supported;Left upper extremity supported   at locked UPWalker-unable to hold grip with L hand   Static Standing - Level of Assistance  4: Min assist    Static Standing - Comment/# of Minutes  2                Objective measurements completed on examination: See above findings.          Self Care:  See full details in instructions-    PT Education - 10/17/18 [id number]    Education Details  Use and wear time of AFO (pt not currently wearing until she needs to stand up); assistance with transfers; standing at counter for increased standing tolerance    Person(s) Educated  Patient;Spouse    Methods  Explanation;Demonstration;Handout     Comprehension  Verbalized understanding;Returned demonstration;Verbal cues required;Need further instruction       PT Short Term Goals - 10/17/18 9604  PT SHORT TERM GOAL #1   Title  Pt will perform HEP for improved flexibility, strength, standing balance, with husband's assistance.  TARGET 11/09/2018    Time  4    Period  Weeks    Status  New    Target Date  11/09/18      PT SHORT TERM GOAL #2   Title  Pt will perform stand pivot transfer with min assist level in order to increase independence at home.      Time  4    Period  Weeks    Status  New    Target Date  11/09/18      PT SHORT TERM GOAL #3   Title  Pt will perform sit to stand with min assist for improved independence with transfers in the home.    Time  4    Period  Weeks    Status  New    Target Date  11/09/18      PT SHORT TERM GOAL #4   Title  Pt will perform standing activities with 1 UE support at sink, with supervision for at least 5 minutes, to ease caregiver burden/improved participation in ADLs.    Time  4    Period  Weeks    Status  New    Target Date  11/09/18        PT Long Term Goals - 10/17/18 0900      PT LONG TERM GOAL #1   Title  Pt will perform progression of HEP for improved flexibility, strength, balance, with husband's assistance.  TARGET 12/07/2018    Time  8    Period  Weeks    Status  New    Target Date  12/07/18      PT LONG TERM GOAL #2   Title  Pt will perform stand pivot transfer at supervision level for improved independence with transfers, decreased caregiver burden.    Time  8    Period  Weeks    Status  New    Target Date  12/07/18      PT LONG TERM GOAL #3   Title  Pt will perform sit to stand with supervision, for improved independence with transfers and decreased caregiver burden.    Time  8    Period  Weeks    Status  New    Target Date  12/07/18      PT LONG TERM GOAL #4   Title  Pt will ambulate x 25 ft w/ UPWalker at min A level consistently in order to  indicate increased independence with ADLs at home.     Time  8    Period  Weeks    Status  New    Target Date  12/07/18             Plan - 10/17/18 0835    Clinical Impression Statement  Pt is a 63 year old female with history of CVAs and L hemiparesis from 2017.  Following d/c from OPPT in 2018, she has had multiple UTIs and cardiomyopathy, CHF, with noted decline in functional mobility.  She was able to walk at daughter's wedding in Sept. 2019; however, she reports not walking at all since December 2019.  She presents with decreased strength, decreased independence with transfers, decreased standing tolerance and balance, decreased ambulation.  She and husband appear motivated for therapy and she would benefit from skilled PT to address the above stated deficits to improve functional mobility and strength  for decreased caregiver burden with ADLs.      Personal Factors and Comorbidities  Comorbidity 3+;Time since onset of injury/illness/exacerbation    Comorbidities  HTN, DM, Hyperlipidemia, CVA (01/2016, 03/2016), hx of UTI, cardiomyopathy, CHF    Examination-Activity Limitations  Stand;Locomotion Level;Transfers    Examination-Participation Restrictions  Community Activity    Stability/Clinical Decision Making  Evolving/Moderate complexity    Clinical Decision Making  Moderate    Rehab Potential  Fair    PT Frequency  1x / week    PT Duration  8 weeks   including eval day   PT Treatment/Interventions  ADLs/Self Care Home Management;Therapeutic exercise;Therapeutic activities;Functional mobility training;Gait training;DME Instruction;Balance training;Neuromuscular re-education;Patient/family education;Orthotic Fit/Training    PT Next Visit Plan  Review initial HEP instructions; work on additions to HEP for stretching (that husband can help with), standing activities, progression to short distance gait if able; transfer training    Consulted and Agree with Plan of Care  Patient;Family  member/caregiver    Family Member Consulted  Husband       Patient will benefit from skilled therapeutic intervention in order to improve the following deficits and impairments:  Decreased mobility, Decreased strength, Decreased balance, Decreased endurance, Abnormal gait, Difficulty walking, Impaired tone  Visit Diagnosis: Muscle weakness (generalized)  Abnormal posture  Unsteadiness on feet  Other abnormalities of gait and mobility     Problem List Patient Active Problem List   Diagnosis Date Noted  . Cardiomyopathy (HCC)   . Coronary artery disease involving native coronary artery of native heart without angina pectoris   . Ischemic cardiomyopathy 07/04/2017  . 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 Orthostatic hypotension 04/08/2016  . Muscle cramps 04/08/2016  . 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 Cerebral thrombosis with cerebral infarction 04/06/2016  . Basilar artery stenosis   . Dysarthria 04/05/2016  . History of CVA with residual deficit 04/05/2016  . Sinus tachycardia 04/05/2016  . Acute encephalopathy 04/05/2016  . Chronic pain 04/05/2016  . Other hyperlipidemia 04/05/2016  . Ischemic stroke (HCC)   . 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 TIA (transient ischemic attack) 11/07/2014  . Uncontrolled hypertension 11/07/2014  . Controlled type 2 diabetes mellitus with diabetic nephropathy (HCC) 11/07/2014    Arwin Bisceglia W. 10/17/2018, 9:04 AM  Lonia Blood, PT 10/17/18 9:04 AM Phone: [phone] Fax: [phone]    [facility] [address] [place], [place], [address] Phone: [phone]   Fax:  [phone]  Name: Shelly Mcdaniel MRN: [id number] Date of Birth: 02-17-1956

## 2018-10-23 ENCOUNTER — Ambulatory Visit: Payer: Medicare Other | Admitting: Physical Therapy

## 2018-10-23 ENCOUNTER — Other Ambulatory Visit: Payer: Self-pay

## 2018-10-23 DIAGNOSIS — M6281 Muscle weakness (generalized): Secondary | ICD-10-CM | POA: Diagnosis not present

## 2018-10-23 DIAGNOSIS — R2681 Unsteadiness on feet: Secondary | ICD-10-CM

## 2018-10-23 NOTE — Patient Instructions (Signed)
Access Code: [email or web address]  URL: [email or web address]  Date: 10/23/2018  Prepared by: Lonia Blood   Exercises  Sit to Stand with Counter Support - 3-5 reps - 2 sets - 1x daily - 5x weekly  Standing Gluteal Sets - 2 sets - 3 sec hold - 1x daily - 5x weekly

## 2018-10-24 NOTE — Therapy (Signed)
[facility] [address] [place], [place], [address] Phone: [phone]   Fax:  [phone]  Physical Therapy Treatment  Patient Details  Name: Shelly Mcdaniel MRN: [id number] Date of Birth: 09-29-1955 Referring Provider (PT): Kirsteins   Encounter Date: 10/23/2018  CLINIC OPERATION CHANGES: [facility] is operating at a low capacity due to COVID-19.  The patient was brought into the clinic for evaluation and/or treatment following universal masking by staff, social distancing, and <10 people in the clinic.  The patient's COVID risk of complications score is 5.   PT End of Session - 10/24/18 0910    Visit Number  2    Number of Visits  8    Date for PT Re-Evaluation  01/14/19    Authorization Type  Medicare/BCBS    PT Start Time  1019    PT Stop Time  1104    PT Time Calculation (min)  45 min    Equipment Utilized During Treatment  Gait belt    Activity Tolerance  Patient tolerated treatment well    Behavior During Therapy  Flat affect;WFL for tasks assessed/performed       Past Medical History:  Diagnosis Date  . Diabetes mellitus without complication (HCC)   . Hyperlipidemia   . Hypertension   . Stroke (HCC)   . UTI (urinary tract infection)     Past Surgical History:  Procedure Laterality Date  . BACK SURGERY    . BREAST SURGERY    . COLONOSCOPY WITH PROPOFOL N/A 12/24/2013   Procedure: COLONOSCOPY WITH PROPOFOL;  Surgeon: Charolett Bumpers, MD;  Location: [facility];  Service: Endoscopy;  Laterality: N/A;  . RIGHT/LEFT HEART CATH AND CORONARY ANGIOGRAPHY N/A 08/10/2017   Procedure: RIGHT/LEFT HEART CATH AND CORONARY ANGIOGRAPHY;  Surgeon: Lennette Bihari, MD;  [facility];  Service: Cardiovascular;  Laterality: N/A;    There were no vitals filed for this visit.  Subjective Assessment - 10/23/18 1022    Subjective  Husband is stretching hamstrings and lower  leg when I'm sitting; husband reports patient is doing at least 50% with transfers.  Wearing the AFO about 25% of the day, and when we leave the house.  Pt reports already doing better with transfers.    Patient is accompained by:  Family member   Husband   Pertinent History  HTN, DM, Hyperlipidemia, CVA (01/2016, 03/2016), hx of UTI, cardiomyopathy, CHF    Limitations  Standing;Walking    Patient Stated Goals  Pt's goals for physical therapy are to improve transfers, building strength back in legs.    Currently in Pain?  No/denies                       [facility] Adult PT Treatment/Exercise - 10/24/18 0900      Transfers   Transfers  Sit to Stand;Stand to Sit;Stand Pivot Transfers    Sit to Stand  3: Mod assist;With upper extremity assist;With armrests;From bed;From chair/3-in-1    Sit to Stand Details (indicate cue type and reason)  w/c to counter; then elevated mat to locked UP Walker    Stand to Sit  4: Min assist;With upper extremity assist;With armrests;To elevated surface;To chair/3-in-1   Initial cues to slow descent into sitting   Stand to Sit Details  Initial cues to slow descent, then patient more readily slows descent into sitting on her own.    Stand Pivot Transfers  3: Mod assist  Stand Pivot Transfer Details (indicate cue type and reason)  wheelchair to mat, cues for hand placement    Number of Reps  2 sets;Other reps (comment)   5 reps from w/c, 5 reps from mat   Comments  Worked on elevated surface for transfer training, x 5 reps from 26" mat height for decreased UE use, increased BUE strengthening.  Upon standing, cues for glut activation, quad activation.        Therapeutic Activites    Therapeutic Activities  Other Therapeutic Activities    Other Therapeutic Activities  In standing at counter, 2 reps x 2 minutes standing.  Cues for glut/quad activation for more upright posture.  In wheelchair, seated LLE kicks/heeldigs, to help with BLE propulsion of chair  and LLE strengthening.      Exercises   Exercises  Knee/Hip      Knee/Hip Exercises: Standing   Functional Squat  2 sets   3 reps     Knee/Hip Exercises: Seated   Long Arc Quad  AAROM;Left;1 set;10 reps;AROM;Right    Heel Slides  AAROM;Left;1 set;10 reps   towel slides   Marching  AROM;Strengthening;Right;Left;2 sets;10 reps   Cues for upright posture            PT Education - 10/24/18 0910    Education Details  HEP additions-see instructions    Person(s) Educated  Patient;Spouse    Methods  Explanation;Demonstration;Tactile cues;Verbal cues;Handout    Comprehension  Verbalized understanding;Returned demonstration;Verbal cues required;Need further instruction       PT Short Term Goals - 10/17/18 0856      PT SHORT TERM GOAL #1   Title  Pt will perform HEP for improved flexibility, strength, standing balance, with husband's assistance.  TARGET 11/09/2018    Time  4    Period  Weeks    Status  New    Target Date  11/09/18      PT SHORT TERM GOAL #2   Title  Pt will perform stand pivot transfer with min assist level in order to increase independence at home.      Time  4    Period  Weeks    Status  New    Target Date  11/09/18      PT SHORT TERM GOAL #3   Title  Pt will perform sit to stand with min assist for improved independence with transfers in the home.    Time  4    Period  Weeks    Status  New    Target Date  11/09/18      PT SHORT TERM GOAL #4   Title  Pt will perform standing activities with 1 UE support at sink, with supervision for at least 5 minutes, to ease caregiver burden/improved participation in ADLs.    Time  4    Period  Weeks    Status  New    Target Date  11/09/18        PT Long Term Goals - 10/17/18 0900      PT LONG TERM GOAL #1   Title  Pt will perform progression of HEP for improved flexibility, strength, balance, with husband's assistance.  TARGET 12/07/2018    Time  8    Period  Weeks    Status  New    Target Date   12/07/18      PT LONG TERM GOAL #2   Title  Pt will perform stand pivot transfer at supervision level for improved independence  with transfers, decreased caregiver burden.    Time  8    Period  Weeks    Status  New    Target Date  12/07/18      PT LONG TERM GOAL #3   Title  Pt will perform sit to stand with supervision, for improved independence with transfers and decreased caregiver burden.    Time  8    Period  Weeks    Status  New    Target Date  12/07/18      PT LONG TERM GOAL #4   Title  Pt will ambulate x 25 ft w/ UPWalker at min A level consistently in order to indicate increased independence with ADLs at home.     Time  8    Period  Weeks    Status  New    Target Date  12/07/18            Plan - 10/24/18 0911    Clinical Impression Statement  Focused skilled PT session today on sit to stand transfer training, standing at counter and UpWalker with cues for glut/quad activation, as well as seated exercises for trunk/lower extremity strengthening.  Pt/husband appear motivated for therapy and appear to be following PT recommendations from eval, noting that transfers are already easier.  Pt needs slightly less assist than eval for sit<>stand, but continues to need cues for upright posture in brief bouts of standing.  Pt will conitnue to benefit from skilled PT to further promote strength and independence with funcitonal mobility.    Personal Factors and Comorbidities  Comorbidity 3+;Time since onset of injury/illness/exacerbation    Comorbidities  HTN, DM, Hyperlipidemia, CVA (01/2016, 03/2016), hx of UTI, cardiomyopathy, CHF    Examination-Activity Limitations  Stand;Locomotion Level;Transfers    Examination-Participation Restrictions  Community Activity    Stability/Clinical Decision Making  Evolving/Moderate complexity    Rehab Potential  Fair    PT Frequency  1x / week    PT Duration  8 weeks   including eval day   PT Treatment/Interventions  ADLs/Self Care Home  Management;Therapeutic exercise;Therapeutic activities;Functional mobility training;Gait training;DME Instruction;Balance training;Neuromuscular re-education;Patient/family education;Orthotic Fit/Training    PT Next Visit Plan  Review additions to HEP (husband reports they are already stretching) standing activities, progression to short distance gait if able; transfer training    Consulted and Agree with Plan of Care  Patient;Family member/caregiver    Family Member Consulted  Husband       Patient will benefit from skilled therapeutic intervention in order to improve the following deficits and impairments:  Decreased mobility, Decreased strength, Decreased balance, Decreased endurance, Abnormal gait, Difficulty walking, Impaired tone  Visit Diagnosis: Muscle weakness (generalized)  Unsteadiness on feet     Problem List Patient Active Problem List   Diagnosis Date Noted  . Cardiomyopathy (HCC)   . Coronary artery disease involving native coronary artery of native heart without angina pectoris   . Ischemic cardiomyopathy 07/04/2017  . 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 Orthostatic hypotension 04/08/2016  . Muscle cramps 04/08/2016  . 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 Cerebral thrombosis with cerebral infarction 04/06/2016  . Basilar artery stenosis   . Dysarthria 04/05/2016  . History of CVA with residual deficit 04/05/2016  . Sinus tachycardia 04/05/2016  . Acute encephalopathy 04/05/2016  . Chronic pain 04/05/2016  . Other hyperlipidemia 04/05/2016  . Ischemic stroke (HCC)   . 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  TIA (transient ischemic attack) 11/07/2014  . Uncontrolled hypertension 11/07/2014  . Controlled type 2 diabetes mellitus with diabetic nephropathy (HCC) 11/07/2014    Tadhg Eskew W. 10/24/2018, 9:16 AM  Gean Maidens., PT   [facility] [address] [place], [place], [address] Phone: [phone]   Fax:  [phone]  Name: Shelly Mcdaniel MRN: [id number] Date of Birth: [birth year]

## 2018-10-30 ENCOUNTER — Ambulatory Visit: Payer: Medicare Other | Admitting: Occupational Therapy

## 2018-10-30 ENCOUNTER — Ambulatory Visit: Payer: Medicare Other | Admitting: Physical Therapy

## 2018-11-06 ENCOUNTER — Ambulatory Visit: Payer: Medicare Other | Admitting: Occupational Therapy

## 2018-11-06 ENCOUNTER — Encounter: Payer: Self-pay | Admitting: Physical Therapy

## 2018-11-06 ENCOUNTER — Other Ambulatory Visit: Payer: Self-pay

## 2018-11-06 ENCOUNTER — Ambulatory Visit: Payer: Medicare Other | Admitting: Physical Therapy

## 2018-11-06 DIAGNOSIS — M25632 Stiffness of left wrist, not elsewhere classified: Secondary | ICD-10-CM

## 2018-11-06 DIAGNOSIS — M6281 Muscle weakness (generalized): Secondary | ICD-10-CM

## 2018-11-06 DIAGNOSIS — R2689 Other abnormalities of gait and mobility: Secondary | ICD-10-CM

## 2018-11-06 DIAGNOSIS — M25642 Stiffness of left hand, not elsewhere classified: Secondary | ICD-10-CM

## 2018-11-06 DIAGNOSIS — I69354 Hemiplegia and hemiparesis following cerebral infarction affecting left non-dominant side: Secondary | ICD-10-CM

## 2018-11-06 DIAGNOSIS — M25612 Stiffness of left shoulder, not elsewhere classified: Secondary | ICD-10-CM

## 2018-11-06 DIAGNOSIS — R29898 Other symptoms and signs involving the musculoskeletal system: Secondary | ICD-10-CM

## 2018-11-06 DIAGNOSIS — R2681 Unsteadiness on feet: Secondary | ICD-10-CM

## 2018-11-06 DIAGNOSIS — R293 Abnormal posture: Secondary | ICD-10-CM

## 2018-11-06 NOTE — Therapy (Signed)
[facility] [address] [place], [place], [address] Phone: [phone]   Fax:  [phone]  Physical Therapy Treatment  Patient Details  Name: Shelly Mcdaniel MRN: [id number] Date of Birth: 08/11/1955 Referring Provider (PT): Kirsteins   Encounter Date: 11/06/2018 CLINIC OPERATION CHANGES: [facility] is operating at a low capacity due to COVID-19.  The patient was brought into the clinic for evaluation and/or treatment following universal masking by staff, social distancing, and <10 people in the clinic.  The patient's COVID risk of complications score is 5.  PT End of Session - 11/06/18 1634    Visit Number  3    Number of Visits  8    Date for PT Re-Evaluation  01/14/19    Authorization Type  Medicare/BCBS    PT Start Time  1405    PT Stop Time  1443    PT Time Calculation (min)  38 min    Equipment Utilized During Treatment  Gait belt    Activity Tolerance  Patient tolerated treatment well    Behavior During Therapy  WFL for tasks assessed/performed       Past Medical History:  Diagnosis Date  . Diabetes mellitus without complication (HCC)   . Hyperlipidemia   . Hypertension   . Stroke (HCC)   . UTI (urinary tract infection)     Past Surgical History:  Procedure Laterality Date  . BACK SURGERY    . BREAST SURGERY    . COLONOSCOPY WITH PROPOFOL N/A 12/24/2013   Procedure: COLONOSCOPY WITH PROPOFOL;  Surgeon: Charolett Bumpers, MD;  Location: [facility];  Service: Endoscopy;  Laterality: N/A;  . RIGHT/LEFT HEART CATH AND CORONARY ANGIOGRAPHY N/A 08/10/2017   Procedure: RIGHT/LEFT HEART CATH AND CORONARY ANGIOGRAPHY;  Surgeon: Lennette Bihari, MD;  [facility];  Service: Cardiovascular;  Laterality: N/A;    There were no vitals filed for this visit.  Subjective Assessment - 11/06/18 1407    Subjective  Just got appointments mixed up (last week and today).   Forgot to wear my AFO in today.  Husband reports pt is helping more with transfers.    Patient is accompained by:  Family member   Husband   Pertinent History  HTN, DM, Hyperlipidemia, CVA (01/2016, 03/2016), hx of UTI, cardiomyopathy, CHF    Limitations  Standing;Walking    Patient Stated Goals  Pt's goals for physical therapy are to improve transfers, building strength back in legs.    Currently in Pain?  No/denies                       [facility] Adult PT Treatment/Exercise - 11/06/18 0001      Transfers   Transfers  Stand Pivot Transfers    Stand Pivot Transfers  3: Mod assist;4: Min assist    Stand Pivot Transfer Details (indicate cue type and reason)  wheelchair to mat, extra time, and cues for hand placement      Exercises   Exercises  Knee/Hip;Other Exercises    Other Exercises   Seated trunk flexibility and control:  anterior/posterior pelvic tilts x 10 reps, then lateral weightshifting x 10 reps, RUE lifts x 10 reps      Knee/Hip Exercises: Stretches   Active Hamstring Stretch  Left;3 reps;60 seconds   foot propped on 4" block-pt prefers both feet propped     Knee/Hip Exercises: Seated   Long Arc Quad  AAROM;Left;1 set;10 reps  Other Seated Knee/Hip Exercises  leg kicks, heel digs x 20 ft, x 2, with cues for deliberate kick/dig and weigthbearing through LLE       Knee/Hip Exercises: Supine   Short Arc Quad Sets  Strengthening;Right;Left;10 reps;2 sets    Bridges  Strengthening;Both;1 set;10 reps    Single Leg Bridge  Left;1 set;10 reps    Other Supine Knee/Hip Exercises  Hooklying marching 2 sets x 10 reps, hip abduction clamshell, 2 sets x 10 reps    Other Supine Knee/Hip Exercises  Legs over red therapy ball:  hip/knee flexion 2 sets x 10 reps; trunk rotation with legs over ball x 10 reps; bridging over ball x 10 reps (therapist needs to hold feet steady on ball)               PT Short Term Goals - 10/17/18 0856      PT SHORT TERM GOAL #1    Title  Pt will perform HEP for improved flexibility, strength, standing balance, with husband's assistance.  TARGET 11/09/2018    Time  4    Period  Weeks    Status  New    Target Date  11/09/18      PT SHORT TERM GOAL #2   Title  Pt will perform stand pivot transfer with min assist level in order to increase independence at home.      Time  4    Period  Weeks    Status  New    Target Date  11/09/18      PT SHORT TERM GOAL #3   Title  Pt will perform sit to stand with min assist for improved independence with transfers in the home.    Time  4    Period  Weeks    Status  New    Target Date  11/09/18      PT SHORT TERM GOAL #4   Title  Pt will perform standing activities with 1 UE support at sink, with supervision for at least 5 minutes, to ease caregiver burden/improved participation in ADLs.    Time  4    Period  Weeks    Status  New    Target Date  11/09/18        PT Long Term Goals - 10/17/18 0900      PT LONG TERM GOAL #1   Title  Pt will perform progression of HEP for improved flexibility, strength, balance, with husband's assistance.  TARGET 12/07/2018    Time  8    Period  Weeks    Status  New    Target Date  12/07/18      PT LONG TERM GOAL #2   Title  Pt will perform stand pivot transfer at supervision level for improved independence with transfers, decreased caregiver burden.    Time  8    Period  Weeks    Status  New    Target Date  12/07/18      PT LONG TERM GOAL #3   Title  Pt will perform sit to stand with supervision, for improved independence with transfers and decreased caregiver burden.    Time  8    Period  Weeks    Status  New    Target Date  12/07/18      PT LONG TERM GOAL #4   Title  Pt will ambulate x 25 ft w/ UPWalker at min A level consistently in order to indicate increased independence with ADLs at  home.     Time  8    Period  Weeks    Status  New    Target Date  12/07/18            Plan - 11/06/18 1635    Clinical Impression  Statement  PT session limited in standing and transfer activities today, as pt forgot to wear AFO or bring it to session today (was hurried by husband due to appt-time mix-up).  Focus of today's skilled PT session was supine and seated exercises for lower extremity and core strengthening.  Also worked on Veterinary surgeon.  Pt needs slightly less assistance for stand pivot transfers wheelchair>mat, but continues to have significant weakness throughout LLE, contributing to pt continuing to need assistance for transfers.    Personal Factors and Comorbidities  Comorbidity 3+;Time since onset of injury/illness/exacerbation    Comorbidities  HTN, DM, Hyperlipidemia, CVA (01/2016, 03/2016), hx of UTI, cardiomyopathy, CHF    Examination-Activity Limitations  Stand;Locomotion Level;Transfers    Examination-Participation Restrictions  Community Activity    Stability/Clinical Decision Making  Evolving/Moderate complexity    Rehab Potential  Fair    PT Frequency  1x / week    PT Duration  8 weeks   including eval day   PT Treatment/Interventions  ADLs/Self Care Home Management;Therapeutic exercise;Therapeutic activities;Functional mobility training;Gait training;DME Instruction;Balance training;Neuromuscular re-education;Patient/family education;Orthotic Fit/Training    PT Next Visit Plan  Check STGs, standing activity progression, progression to short distance gait if able; update HEP    Consulted and Agree with Plan of Care  Patient;Family member/caregiver    Family Member Consulted  Husband       Patient will benefit from skilled therapeutic intervention in order to improve the following deficits and impairments:  Decreased mobility, Decreased strength, Decreased balance, Decreased endurance, Abnormal gait, Difficulty walking, Impaired tone  Visit Diagnosis: Muscle weakness (generalized)     Problem List Patient Active Problem List   Diagnosis Date Noted  . Cardiomyopathy (HCC)   . Coronary  artery disease involving native coronary artery of native heart without angina pectoris   . Ischemic cardiomyopathy 07/04/2017  . 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 Orthostatic hypotension 04/08/2016  . Muscle cramps 04/08/2016  . 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 Cerebral thrombosis with cerebral infarction 04/06/2016  . Basilar artery stenosis   . Dysarthria 04/05/2016  . History of CVA with residual deficit 04/05/2016  . Sinus tachycardia 04/05/2016  . Acute encephalopathy 04/05/2016  . Chronic pain 04/05/2016  . Other hyperlipidemia 04/05/2016  . Ischemic stroke (HCC)   . 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 TIA (transient ischemic attack) 11/07/2014  . Uncontrolled hypertension 11/07/2014  . Controlled type 2 diabetes mellitus with diabetic nephropathy (HCC) 11/07/2014    Westlynn Fifer W. 11/06/2018, 4:38 PM Gean Maidens., PT [place] [facility] [address] [place], [place], [address] Phone: [phone]   Fax:  [phone]  Name: ANNALISSA MARANDOLA MRN: [id number] Date of Birth: 12/01/1955

## 2018-11-06 NOTE — Patient Instructions (Signed)
PERFORM ALL SLOWLY AND ONLY AS FAR AS YOU CAN WITHOUT PAIN!    Supine Reach    Use stronger arm to move involved arm straight up. Then slowly raise back (elbow extended as possible)  Keep thumb up. Hold 10 seconds. Repeat 10 times. Do 1-2 sessions per day.    ded. Hold 5 seconds. Repeat 10 times then perform side-to-side, and in circles. Do 1-2 sessions per day.   Elbow: Extension    Clasp hands and then lean over to stretch down/out elbow between legs as far as you can without pain.  Thumb facing up.    Hold 10 seconds. Repeat 10 times. Do 1-2 sessions per day. CAUTION: Stretch slowly and gently. Do not force joint.

## 2018-11-07 ENCOUNTER — Encounter: Payer: Self-pay | Admitting: Occupational Therapy

## 2018-11-07 NOTE — Therapy (Signed)
[facility] [address] [place], [place], [address] Phone: [phone]   Fax:  [phone]  Occupational Therapy Treatment  Patient Details  Name: Shelly Mcdaniel MRN: [id number] Date of Birth: 1955/11/02 Referring Provider (OT): Dr. Claudette Laws   Encounter Date: 11/06/2018  OT End of Session - 11/07/18 1301    Visit Number  2    Number of Visits  9    Date for OT Re-Evaluation  12/15/18    Authorization Type  06/15/22-4/0/10 cert.  Medicare / BCBS    Authorization - Visit Number  2    Authorization - Number of Visits  10    OT Start Time  1448    OT Stop Time  1530    OT Time Calculation (min)  42 min    Activity Tolerance  Patient tolerated treatment well    Behavior During Therapy  Flat affect       Past Medical History:  Diagnosis Date  . Diabetes mellitus without complication (HCC)   . Hyperlipidemia   . Hypertension   . Stroke (HCC)   . UTI (urinary tract infection)     Past Surgical History:  Procedure Laterality Date  . BACK SURGERY    . BREAST SURGERY    . COLONOSCOPY WITH PROPOFOL N/A 12/24/2013   Procedure: COLONOSCOPY WITH PROPOFOL;  Surgeon: Charolett Bumpers, MD;  Location: [facility];  Service: Endoscopy;  Laterality: N/A;  . RIGHT/LEFT HEART CATH AND CORONARY ANGIOGRAPHY N/A 08/10/2017   Procedure: RIGHT/LEFT HEART CATH AND CORONARY ANGIOGRAPHY;  Surgeon: Lennette Bihari, MD;  Location: [facility];  Service: Cardiovascular;  Laterality: N/A;    There were no vitals filed for this visit.  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  PMH:  HTN, DM, orthostatic hypotension, hx of adhesive capsulitis L  shoulder, HDL, cardiomyopathy, s/p Botox 08/14/18.      Limitations  fall risk    Patient Stated Goals  to be able to move L hand and squeeze stronger    Currently in Pain?  No/denies   unless stretches too far      In supine, PROM shoulder flex as able with instruction in proper positioning for self PROM.  Pt then returned demo.  In sitting, PROM wrist ext, finger ext, and supination within pt tolerance.  Reviewed proper positioning when stretching LUE.  Pt verbalized understanding.  Pt instructed in use of adaptive cutting board due to interest in doing more in kitchen and discussion of strategies of what pt is currently doing.  Recommended shelf liner or wet paper towel under plate/board and adaptive cutting board.  Pt emailed link to options of adaptive cutting boards per pt request.    Supine>sit with min A  Mat>w/c transfer with min-mod A to R side.       OT Education - 11/07/18 1300    Education Details  Reviewed HEP and added exercises--see pt instructions    Person(s) Educated  Patient    Methods  Explanation;Demonstration;Verbal cues;Handout    Comprehension  Verbalized understanding;Returned demonstration;Verbal cues required;Need further instruction       OT Short Term Goals - 10/17/18 1253  OT SHORT TERM GOAL #1   Title  Pt will be independnt with updated splint wear/care for spasticity management and improved positioning.--check STGs 11/15/18    Time  4    Period  Weeks    Status  New      OT SHORT TERM GOAL #2   Title  Pt/family to verbalize understanding with proper positioning of LUE during activity and in bed to decr risk of future complications including pain.    Time  4    Period  Weeks    Status  New      OT SHORT TERM GOAL #3   Title  ----      OT SHORT TERM GOAL #4   Title  ----      OT SHORT TERM GOAL #5   Title  ----        OT Long Term Goals - 10/17/18 1255      OT LONG TERM GOAL #1   Title  Pt/ caregiver will be independent with  updated HEP--check LTGs 12/15/18    Time  8    Period  Weeks    Status  New      OT LONG TERM GOAL #2   Title  Pt will demo improved PROM LUE to be able to position LUE appropriately on up walker in prep for short distance ambulation.    Time  8    Period  Weeks    Status  New      OT LONG TERM GOAL #3   Title  Pt will demo at least 80* L shoulder flex without pain for incr ease/safety with hygiene and UB dressing.    Time  8    Period  Weeks    Status  New      OT LONG TERM GOAL #4   Title  Pt will be able to use LUE as stabilizer for selected ADL/IADL tasks.    Time  8    Period  Weeks    Status  New      OT LONG TERM GOAL #5   Title  ---            Plan - 11/07/18 1304    Clinical Impression Statement  Pt reports incr BADL performance and good carryover with HEP.  Recommended hold OT until approx 2-3 weeks after Botox (scheduled for next week) for incr benefit.  Pt in agreement with plan.    OT Occupational Profile and History  Detailed Assessment- Review of Records and additional review of physical, cognitive, psychosocial history related to current functional performance    Occupational performance deficits (Please refer to evaluation for details):  ADL's;IADL's;Leisure;Social Participation    Body Structure / Function / Physical Skills  ADL;GMC;UE functional use;Coordination;IADL;Pain;Strength;Improper spinal/pelvic alignment;Body mechanics;Decreased knowledge of use of DME;Flexibility;Mobility;ROM;Tone    Rehab Potential  Fair    Clinical Decision Making  Several treatment options, min-mod task modification necessary    Comorbidities Affecting Occupational Performance:  Presence of comorbidities impacting occupational performance    Comorbidities impacting occupational performance description:  hx of adhesive capsulitis L shoulder, orthostatic hypotension, cardiomyopathy    Modification or Assistance to Complete Evaluation   Min-Moderate modification of tasks or assist  with assess necessary to complete eval    OT Frequency  1x / week    OT Duration  8 weeks   +eval over 12 weeks    OT Treatment/Interventions  Self-care/ADL training;Therapeutic exercise;Patient/family education;Splinting;Neuromuscular education;Moist Heat;Fluidtherapy;Functional Mobility Training;Energy conservation;Therapeutic activities;Cryotherapy;Ultrasound;Contrast Bath;DME  and/or AE instruction;Manual Therapy;Passive range of motion    Plan  Botox scheduled next week--will hold OT until approx 2-3 wks s/p Botox; check splint and modify prn/fabricate custom resting hand splint if needed (did not bring in today) and instruct in wear/care schedule; review HEP if time    OT Home Exercise Plan  Education provided:  initial HEP issued 10/16/18    Recommended Other Services  current with PT     Consulted and Agree with Plan of Care  Patient;Family member/caregiver    Family Member Consulted  husband       Patient will benefit from skilled therapeutic intervention in order to improve the following deficits and impairments:  Body Structure / Function / Physical Skills  Visit Diagnosis: Hemiplegia and hemiparesis following cerebral infarction affecting left non-dominant side (HCC)  Muscle weakness (generalized)  Unsteadiness on feet  Other symptoms and signs involving the musculoskeletal system  Abnormal posture  Other abnormalities of gait and mobility  Stiffness of left shoulder, not elsewhere classified  Stiffness of left hand, not elsewhere classified  Stiffness of left wrist, not elsewhere classified    Problem List Patient Active Problem List   Diagnosis Date Noted  . Cardiomyopathy (HCC)   . Coronary artery disease involving native coronary artery of native heart without angina pectoris   . Ischemic cardiomyopathy 07/04/2017  . 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 Orthostatic hypotension 04/08/2016  . Muscle cramps 04/08/2016  . Right pontine  cerebrovascular accident (HCC) 04/08/2016  . 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 Cerebral thrombosis with cerebral infarction 04/06/2016  . Basilar artery stenosis   . Dysarthria 04/05/2016  . History of CVA with residual deficit 04/05/2016  . Sinus tachycardia 04/05/2016  . Acute encephalopathy 04/05/2016  . Chronic pain 04/05/2016  . Other hyperlipidemia 04/05/2016  . Ischemic stroke (HCC)   . 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 TIA (transient ischemic attack) 11/07/2014  . Uncontrolled hypertension 11/07/2014  . Controlled type 2 diabetes mellitus with diabetic nephropathy (HCC) 11/07/2014    [facility] Hospital[facility]REEMAN, 11/07/2018, 1:07 PM  [facility] [address] [place], [place], [address] Phone: [phone]   Fax:  [phone]  Name: Shelly Mcdaniel MRN: [id number] Date of Birth: 1956-06-03   Willa FraterAngela , OTR/L [facility] [address] [place], [place]  [id number] [phone] phone [phone] 11/07/18 1:08 PM

## 2018-11-12 ENCOUNTER — Encounter: Payer: Self-pay | Admitting: Physical Therapy

## 2018-11-12 ENCOUNTER — Ambulatory Visit: Payer: Medicare Other | Attending: Physical Medicine & Rehabilitation | Admitting: Physical Therapy

## 2018-11-12 ENCOUNTER — Other Ambulatory Visit: Payer: Self-pay

## 2018-11-12 DIAGNOSIS — M6281 Muscle weakness (generalized): Secondary | ICD-10-CM | POA: Diagnosis present

## 2018-11-12 DIAGNOSIS — R2681 Unsteadiness on feet: Secondary | ICD-10-CM

## 2018-11-12 DIAGNOSIS — R2689 Other abnormalities of gait and mobility: Secondary | ICD-10-CM | POA: Insufficient documentation

## 2018-11-12 NOTE — Therapy (Signed)
[facility] [address] [place], [place], [address] Phone: [phone]   Fax:  [phone]  Physical Therapy Treatment  Patient Details  Name: Shelly Mcdaniel MRN: [id number] Date of Birth: 1955/07/26 Referring Provider (PT): Kirsteins   Encounter Date: 11/12/2018  CLINIC OPERATION CHANGES: [facility] is operating at a low capacity due to COVID-19.  The patient was brought into the clinic for evaluation and/or treatment following universal masking by staff, social distancing, and <10 people in the clinic.  The patient's COVID risk of complications score is 5.   PT End of Session - 11/12/18 1455    Visit Number  4    Number of Visits  8    Date for PT Re-Evaluation  01/14/19    Authorization Type  Medicare/BCBS    PT Start Time  1310   Pt arrives late   PT Stop Time  1350    PT Time Calculation (min)  40 min    Equipment Utilized During Treatment  Gait belt    Activity Tolerance  Patient tolerated treatment well    Behavior During Therapy  WFL for tasks assessed/performed       Past Medical History:  Diagnosis Date  . Diabetes mellitus without complication ([place])   . Hyperlipidemia   . Hypertension   . Stroke ([place])   . UTI (urinary tract infection)     Past Surgical History:  Procedure Laterality Date  . BACK SURGERY    . BREAST SURGERY    . COLONOSCOPY WITH PROPOFOL N/A 12/24/2013   Procedure: COLONOSCOPY WITH PROPOFOL;  Surgeon: [name], MD;  Location: [facility];  Service: Endoscopy;  Laterality: N/A;  . RIGHT/LEFT HEART CATH AND CORONARY ANGIOGRAPHY N/A 08/10/2017   Procedure: RIGHT/LEFT HEART CATH AND CORONARY ANGIOGRAPHY;  Surgeon: Troy Sine, MD;  Location[facility];  Service: Cardiovascular;  Laterality: N/A;    There were no vitals filed for this visit.  Subjective Assessment - 11/12/18 1312    Subjective  Was so tired from moving on Saturday,  that I slept most of the day yesterday.  Will fully move into the new house on Wednesday.    Patient is accompained by:  Family member   Husband   Pertinent History  HTN, DM, Hyperlipidemia, CVA (01/2016, 03/2016), hx of UTI, cardiomyopathy, CHF    Limitations  Standing;Walking    Patient Stated Goals  Pt's goals for physical therapy are to improve transfers, building strength back in legs.    Currently in Pain?  No/denies                       [facility] Adult PT Treatment/Exercise - 11/12/18 0001      Transfers   Transfers  Sit to Stand;Stand to Sit;Stand Pivot Transfers    Sit to Stand  3: Mod assist;With upper extremity assist;With armrests;From bed;From chair/3-in-1    Sit to Stand Details (indicate cue type and reason)  w/c to counter; x 3 attempts    Stand to Sit  4: Min assist;With upper extremity assist;With armrests;To elevated surface;To chair/3-in-1    Stand Pivot Transfers  4: Min assist    Stand Pivot Transfer Details (indicate cue type and reason)  wheelchair <>mat    Transfer Cueing  Cued patient for scooting, foot placement, hand placement with sit<>stand, pt has difficulty today, c/o increased weakness in legs.  Pt only able to stand 2:30, with 2 additional trials  to stand, unable    Comments  Standing at sink x 2:30, with UE support; cues for activation of gluts for more upright posture.      Knee/Hip Exercises: Stretches   Active Hamstring Stretch  Left;Right;3 reps;30 seconds   foot propped on 4" block   Active Hamstring Stretch Limitations  Cues for posture, technique      Knee/Hip Exercises: Seated   Long Arc Quad  AAROM;Left;1 set;10 reps    Heel Slides  AAROM;Left;1 set;10 reps   towel slides   Marching  AROM;Strengthening;Right;Left;2 sets;10 reps   cues for upright postuer     Pt performs 40 ft x 2, LLE leg kicks and heeldigs in w/c with therapist assistance for steering around obstacles. While standing, PT provides cues and pt return demo  standing glut sets.     Self Care:  Pt appears fatigued today and reports increased sleeping yesterday.  She feels she is fatigued due to stressors of moving (husband and son have been moving furniture from their home of 35 years to a townhome; full move to be completed in the next week).  PT discussed stressors, fatigue in relation to CVA history-recommended plenty of rest, fluids (to avoid dehydration), and to monitor if symptoms/warning signs of CVA occur to call 911.  Discussed progress towards PT goals.   PT Education - 11/12/18 1357    Education Details  HEP additions-see instructions    Person(s) Educated  Patient    Methods  Explanation;Demonstration;Tactile cues;Verbal cues;Handout    Comprehension  Verbalized understanding;Returned demonstration;Verbal cues required;Need further instruction       PT Short Term Goals - 11/12/18 1313      PT SHORT TERM GOAL #1   Title  Pt will perform HEP for improved flexibility, strength, standing balance, with husband's assistance.  TARGET 11/09/2018    Time  4    Period  Weeks    Status  Achieved    Target Date  11/09/18      PT SHORT TERM GOAL #2   Title  Pt will perform stand pivot transfer with min assist level in order to increase independence at home.      Baseline  min assist 11/12/2018    Time  4    Period  Weeks    Status  Achieved    Target Date  11/09/18      PT SHORT TERM GOAL #3   Title  Pt will perform sit to stand with min assist for improved independence with transfers in the home.    Time  4    Period  Weeks    Status  Not Met    Target Date  11/09/18      PT SHORT TERM GOAL #4   Title  Pt will perform standing activities with 1 UE support at sink, with supervision for at least 5 minutes, to ease caregiver burden/improved participation in ADLs.    Baseline  2:30 at sink 11/12/2018; reports up to 4 minutes at home    Time  4    Period  Weeks    Status  Not Met    Target Date  11/09/18        PT Long Term Goals -  10/17/18 0900      PT LONG TERM GOAL #1   Title  Pt will perform progression of HEP for improved flexibility, strength, balance, with husband's assistance.  TARGET 12/07/2018    Time  8    Period  Weeks  Status  New    Target Date  12/07/18      PT LONG TERM GOAL #2   Title  Pt will perform stand pivot transfer at supervision level for improved independence with transfers, decreased caregiver burden.    Time  8    Period  Weeks    Status  New    Target Date  12/07/18      PT LONG TERM GOAL #3   Title  Pt will perform sit to stand with supervision, for improved independence with transfers and decreased caregiver burden.    Time  8    Period  Weeks    Status  New    Target Date  12/07/18      PT LONG TERM GOAL #4   Title  Pt will ambulate x 25 ft w/ UPWalker at min A level consistently in order to indicate increased independence with ADLs at home.     Time  8    Period  Weeks    Status  New    Target Date  12/07/18            Plan - 11/12/18 1456    Clinical Impression Statement  Assessed STGs this visit, with pt meeting 2 of 4 STGs.  She has met STG 1 for HEP with husband's supervision and STG 2 for stand pivot transfer with min assist.  She has not yet met STG 3 and 4.  Pt has difficulty today with sit to stand transfers, reports weakness may be due to stress of moving this week or possible UTI (she is following up with MD).  Pt will continue to benefit from skilled PT to address strength, balance, and gait training.    Personal Factors and Comorbidities  Comorbidity 3+;Time since onset of injury/illness/exacerbation    Comorbidities  HTN, DM, Hyperlipidemia, CVA (01/2016, 03/2016), hx of UTI, cardiomyopathy, CHF    Examination-Activity Limitations  Stand;Locomotion Level;Transfers    Examination-Participation Restrictions  Community Activity    Stability/Clinical Decision Making  Evolving/Moderate complexity    Rehab Potential  Fair    PT Frequency  1x / week    PT  Duration  8 weeks   including eval day   PT Treatment/Interventions  ADLs/Self Care Home Management;Therapeutic exercise;Therapeutic activities;Functional mobility training;Gait training;DME Instruction;Balance training;Neuromuscular re-education;Patient/family education;Orthotic Fit/Training    PT Next Visit Plan  Sit to stand practice from elevated heights, standing activity progression, progression to short distance gait.    Consulted and Agree with Plan of Care  Patient;Family member/caregiver    Family Member Consulted  Husband       Patient will benefit from skilled therapeutic intervention in order to improve the following deficits and impairments:  Decreased mobility, Decreased strength, Decreased balance, Decreased endurance, Abnormal gait, Difficulty walking, Impaired tone  Visit Diagnosis: Muscle weakness (generalized)  Unsteadiness on feet     Problem List Patient Active Problem List   Diagnosis Date Noted  . Cardiomyopathy ([place])   . Coronary artery disease involving native coronary artery of native heart without angina pectoris   . Ischemic cardiomyopathy 07/04/2017  . 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 Orthostatic hypotension 04/08/2016  . Muscle cramps 04/08/2016  . Right pontine cerebrovascular accident ([place]) 04/08/2016  . Spastic hemiparesis of left nondominant side ([place]) 04/08/2016  . Cerebral thrombosis with cerebral infarction 04/06/2016  . Basilar artery stenosis   . Dysarthria 04/05/2016  . History of CVA with residual deficit 04/05/2016  . Sinus tachycardia 04/05/2016  .  Acute encephalopathy 04/05/2016  . Chronic pain 04/05/2016  . Other hyperlipidemia 04/05/2016  . Ischemic stroke ([place])   . 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 TIA (transient ischemic attack) 11/07/2014  . Uncontrolled hypertension 11/07/2014  . Controlled type 2 diabetes mellitus with diabetic nephropathy ([place]) 11/07/2014    Manasvi Dickard W. 11/12/2018, 3:04 PM  Mady Haagensen,  PT 11/12/18 3:08 PM Phone: [phone] Fax: [facility] [address] [place], [place], [address] Phone: [phone]   Fax:  [phone]  Name: Shelly Mcdaniel MRN: [id number] Date of Birth: 10-29-1955

## 2018-11-12 NOTE — Patient Instructions (Addendum)
Access Code: [email or web address]  URL: [email or web address]  Date: 11/12/2018  Prepared by: Lonia Blood   Exercises  Sit to Stand with Counter Support - 3-5 reps - 2 sets - 1x daily - 5x weekly  Standing Gluteal Sets - 2 sets - 3 sec hold - 1x daily - 5x weekly   Added 11/12/2018 Seated Long Arc Quad - 10 reps - 2 sets - 1-2x daily - 5x weekly  Seated March - 10 reps - 2 sets - 1-2x daily - 5x weekly  Seated Heel Slide - 10 reps - 2 sets - 1-2x daily - 5x weekly  Seated Hamstring Stretch - 3 reps - 1 sets - 30 sec hold - 1-2x daily - 5x weekly

## 2018-11-13 ENCOUNTER — Encounter: Payer: Medicare Other | Attending: Physical Medicine & Rehabilitation | Admitting: Physical Medicine & Rehabilitation

## 2018-11-13 ENCOUNTER — Other Ambulatory Visit: Payer: Self-pay

## 2018-11-13 ENCOUNTER — Encounter: Payer: Self-pay | Admitting: Physical Medicine & Rehabilitation

## 2018-11-13 VITALS — BP 136/85 | HR 75 | Temp 98.6°F | Ht 68.0 in | Wt 190.0 lb

## 2018-11-13 DIAGNOSIS — G8110 Spastic hemiplegia affecting unspecified side: Secondary | ICD-10-CM | POA: Insufficient documentation

## 2018-11-13 DIAGNOSIS — G8114 Spastic hemiplegia affecting left nondominant side: Secondary | ICD-10-CM

## 2018-11-13 NOTE — Patient Instructions (Signed)

## 2018-11-13 NOTE — Progress Notes (Signed)
Botox Injection for spasticity using needle EMG guidance  Dilution: 50 Units/ml Indication: Severe spasticity which interferes with ADL,mobility and/or  hygiene and is unresponsive to medication management and other conservative care Informed consent was obtained after describing risks and benefits of the procedure with the patient. This includes bleeding, bruising, infection, excessive weakness, or medication side effects. A REMS form is on file and signed. Needle: 25g 2" needle electrode Number of units per muscle   Biceps 150  Brachioradialis 50  FDS 50 FDP 50 FCR 50 FPL 50U All injections were done after obtaining appropriate EMG activity and after negative drawback for blood. The patient tolerated the procedure well. Post procedure instructions were given. A followup appointment was made.

## 2018-11-19 ENCOUNTER — Ambulatory Visit: Payer: Medicare Other | Admitting: Physical Therapy

## 2018-11-19 ENCOUNTER — Encounter: Payer: Self-pay | Admitting: Physical Therapy

## 2018-11-19 ENCOUNTER — Other Ambulatory Visit: Payer: Self-pay

## 2018-11-19 DIAGNOSIS — R2689 Other abnormalities of gait and mobility: Secondary | ICD-10-CM

## 2018-11-19 DIAGNOSIS — M6281 Muscle weakness (generalized): Secondary | ICD-10-CM | POA: Diagnosis not present

## 2018-11-19 DIAGNOSIS — R2681 Unsteadiness on feet: Secondary | ICD-10-CM

## 2018-11-20 NOTE — Therapy (Signed)
[facility] [address] [place], [place], [address] Phone: [phone]   Fax:  [phone]  Physical Therapy Treatment  Patient Details  Name: Shelly Mcdaniel MRN: [id number] Date of Birth: 14-Mar-1956 Referring Provider (PT): Kirsteins   Encounter Date: 11/19/2018  CLINIC OPERATION CHANGES: Outpatient Neuro Rehab is open at lower capacity following universal masking, social distancing, and patient screening.  The patient's COVID risk of complications score is 5.   PT End of Session - 11/20/18 0758    Visit Number  5    Number of Visits  8    Date for PT Re-Evaluation  01/14/19    Authorization Type  Medicare/BCBS    PT Start Time  1404    PT Stop Time  1446    PT Time Calculation (min)  42 min    Equipment Utilized During Treatment  Gait belt    Activity Tolerance  Patient tolerated treatment well;Patient limited by pain   Chronic pain in R knee-incr with standing and gait   Behavior During Therapy  WFL for tasks assessed/performed       Past Medical History:  Diagnosis Date  . Diabetes mellitus without complication ([place])   . Hyperlipidemia   . Hypertension   . Stroke ([place])   . UTI (urinary tract infection)     Past Surgical History:  Procedure Laterality Date  . BACK SURGERY    . BREAST SURGERY    . COLONOSCOPY WITH PROPOFOL N/A 12/24/2013   Procedure: COLONOSCOPY WITH PROPOFOL;  Surgeon: [name], MD;  Location: [facility];  Service: Endoscopy;  Laterality: N/A;  . RIGHT/LEFT HEART CATH AND CORONARY ANGIOGRAPHY N/A 08/10/2017   Procedure: RIGHT/LEFT HEART CATH AND CORONARY ANGIOGRAPHY;  Surgeon: Troy Sine, MD;  Location: [facility];  Service: Cardiovascular;  Laterality: N/A;    There were no vitals filed for this visit.  Subjective Assessment - 11/19/18 1407    Subjective  Feeling better today.  Had the injections in my L arm last week with Dr. Letta Pate.    Patient is  accompained by:  Family member   Husband   Pertinent History  HTN, DM, Hyperlipidemia, CVA (01/2016, 03/2016), hx of UTI, cardiomyopathy, CHF    Limitations  Standing;Walking    Patient Stated Goals  Pt's goals for physical therapy are to improve transfers, building strength back in legs.    Currently in Pain?  No/denies                       [facility] Adult PT Treatment/Exercise - 11/20/18 0745      Transfers   Transfers  Sit to Stand;Stand to Sit;Stand Pivot Transfers    Sit to Stand  3: Mod assist;With upper extremity assist;With armrests;From bed;From chair/3-in-1   x 3 rep from mat surface   Sit to Stand Details  --   Cues to push up from mat then reach up to UP walker   Stand to Sit  4: Min assist;With upper extremity assist;With armrests;To elevated surface;To chair/3-in-1    Stand Pivot Transfers  4: Min assist;3: Mod assist    Stand Pivot Transfer Details (indicate cue type and reason)  wheelchair<>mat   cues for hand placement to reach to arm of w/c     Ambulation/Gait   Ambulation/Gait  Yes    Ambulation/Gait Assistance  3: Mod assist    Ambulation/Gait Assistance Details  Using UP Walker, pt requires max assist for LUE  placement on walker's forearm rest and L handle    Ambulation Distance (Feet)  20 Feet    Assistive device  Other (Comment)   UPWalker   Gait Pattern  Step-to pattern;Decreased step length - left;Decreased step length - right;Decreased stride length;Right flexed knee in stance;Left flexed knee in stance;Antalgic   Limited due to R knee pain   Ambulation Surface  Level;Indoor    Pre-Gait Activities  First attempt at gait today since eval-limited due to R knee pain      Knee/Hip Exercises: Standing   Functional Squat  2 sets;5 reps   from walker to elevated mat surface     Knee/Hip Exercises: Seated   Long Arc Quad  AAROM;Left;1 set;5 reps    Heel Slides  AAROM;Left;1 set;5 reps    [facility]  Adduction squeezes using pillow x 5 reps, 2  sets    Other Seated Knee/Hip Exercises  chair push-ups x 10 reps from mat surface,    LLE positioned slightly posterior to RLE     Knee/Hip Exercises: Supine   Quad Sets  Strengthening;Right;Left;5 reps   folded towel under knees   Short Arc Quad Sets  Strengthening;Right;Left;10 reps;2 sets    Short Arc Quad Sets Limitations  Pt c/o pain in R knee    Other Supine Knee/Hip Exercises  Hooklying adduction pillow squeezes x 10 reps, with bilateral lower extremities extended (towel under knees), assisted hip internal rotation to neutral, x 10 reps       REviewed HEP given last visit, and pt verbalizes/return demo.  States she is performing sit<>stand from regular height chairs with husband's assistance.      PT Education - 11/20/18 0756    Education Details  Review of HEP from last visit; requested that patient follow-up on R knee pain (longstanding), as it is limiting factor in standing and gait activities today    Person(s) Educated  Patient    Methods  Explanation;Demonstration   Pt return demo understanding of HEP   Comprehension  Verbalized understanding       PT Short Term Goals - 11/12/18 1313      PT SHORT TERM GOAL #1   Title  Pt will perform HEP for improved flexibility, strength, standing balance, with husband's assistance.  TARGET 11/09/2018    Time  4    Period  Weeks    Status  Achieved    Target Date  11/09/18      PT SHORT TERM GOAL #2   Title  Pt will perform stand pivot transfer with min assist level in order to increase independence at home.      Baseline  min assist 11/12/2018    Time  4    Period  Weeks    Status  Achieved    Target Date  11/09/18      PT SHORT TERM GOAL #3   Title  Pt will perform sit to stand with min assist for improved independence with transfers in the home.    Time  4    Period  Weeks    Status  Not Met    Target Date  11/09/18      PT SHORT TERM GOAL #4   Title  Pt will perform standing activities with 1 UE support at sink,  with supervision for at least 5 minutes, to ease caregiver burden/improved participation in ADLs.    Baseline  2:30 at sink 11/12/2018; reports up to 4 minutes at home    Time  4    Period  Weeks    Status  Not Met    Target Date  11/09/18        PT Long Term Goals - 10/17/18 0900      PT LONG TERM GOAL #1   Title  Pt will perform progression of HEP for improved flexibility, strength, balance, with husband's assistance.  TARGET 12/07/2018    Time  8    Period  Weeks    Status  New    Target Date  12/07/18      PT LONG TERM GOAL #2   Title  Pt will perform stand pivot transfer at supervision level for improved independence with transfers, decreased caregiver burden.    Time  8    Period  Weeks    Status  New    Target Date  12/07/18      PT LONG TERM GOAL #3   Title  Pt will perform sit to stand with supervision, for improved independence with transfers and decreased caregiver burden.    Time  8    Period  Weeks    Status  New    Target Date  12/07/18      PT LONG TERM GOAL #4   Title  Pt will ambulate x 25 ft w/ UPWalker at min A level consistently in order to indicate increased independence with ADLs at home.     Time  8    Period  Weeks    Status  New    Target Date  12/07/18            Plan - 11/20/18 0759    Clinical Impression Statement  Skilled PT session today focused on transfer training, and short distance gait with UP W[place]ker.  Today is first day since eval that gait has been attempted, with pt ambulating 20 ft.  Pt is limited in full extended knees due to tightness in hamstrings, but with gait and exercises today, pt c/o increased R knee pain (pain is chronic in nature).  Recommended pt follow up with MD regarding knee pain, as it may be limiting factor with standing and gait activities going forward.    Personal Factors and Comorbidities  Comorbidity 3+;Time since onset of injury/illness/exacerbation    Comorbidities  HTN, DM, Hyperlipidemia, CVA (01/2016,  03/2016), hx of UTI, cardiomyopathy, CHF    Examination-Activity Limitations  Stand;Locomotion Level;Transfers    Examination-Participation Restrictions  Community Activity    Stability/Clinical Decision Making  Evolving/Moderate complexity    Rehab Potential  Fair    PT Frequency  1x / week    PT Duration  8 weeks   including eval day   PT Treatment/Interventions  ADLs/Self Care Home Management;Therapeutic exercise;Therapeutic activities;Functional mobility training;Gait training;DME Instruction;Balance training;Neuromuscular re-education;Patient/family education;Orthotic Fit/Training    PT Next Visit Plan  Continue sit to stand practice from elevated heights, standing activity progression, progression to short distance gait; chair/mat push-ups, try NuStep or SciFit for lower extremitiy flexibility    Consulted and Agree with Plan of Care  Patient;Family member/caregiver    Family Member Consulted  Husband (at beginning of session)       Patient will benefit from skilled therapeutic intervention in order to improve the following deficits and impairments:  Decreased mobility, Decreased strength, Decreased balance, Decreased endurance, Abnormal gait, Difficulty walking, Impaired tone  Visit Diagnosis: Muscle weakness (generalized)  Other abnormalities of gait and mobility  Unsteadiness on feet     Problem List Patient Active Problem List  Diagnosis Date Noted  . Cardiomyopathy ([place])   . Coronary artery disease involving native coronary artery of native heart without angina pectoris   . Ischemic cardiomyopathy 07/04/2017  . 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 Orthostatic hypotension 04/08/2016  . Muscle cramps 04/08/2016  . Right pontine cerebrovascular accident ([place]) 04/08/2016  . Spastic hemiparesis of left nondominant side ([place]) 04/08/2016  . Cerebral thrombosis with cerebral infarction 04/06/2016  . Basilar artery stenosis   . Dysarthria 04/05/2016  .  History of CVA with residual deficit 04/05/2016  . Sinus tachycardia 04/05/2016  . Acute encephalopathy 04/05/2016  . Chronic pain 04/05/2016  . Other hyperlipidemia 04/05/2016  . Ischemic stroke ([place])   . 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 TIA (transient ischemic attack) 11/07/2014  . Uncontrolled hypertension 11/07/2014  . Controlled type 2 diabetes mellitus with diabetic nephropathy ([place]) 11/07/2014    Shamar Engelmann W. 11/20/2018, 8:04 AM  Mady Haagensen, PT 11/20/18 8:05 AM Phone: [phone] Fax: [facility] [address] [place], [place], [address] Phone: [phone]   Fax:  [phone]  Name: Shelly Mcdaniel MRN: [id number] Date of Birth: [birth year]

## 2018-11-26 ENCOUNTER — Ambulatory Visit: Payer: Medicare Other | Admitting: Physical Therapy

## 2018-12-04 ENCOUNTER — Ambulatory Visit: Payer: Medicare Other | Admitting: Physical Therapy

## 2018-12-04 ENCOUNTER — Encounter: Payer: Self-pay | Admitting: Physical Therapy

## 2018-12-04 ENCOUNTER — Telehealth: Payer: Self-pay | Admitting: Physical Therapy

## 2018-12-04 ENCOUNTER — Other Ambulatory Visit: Payer: Self-pay

## 2018-12-04 DIAGNOSIS — M6281 Muscle weakness (generalized): Secondary | ICD-10-CM | POA: Diagnosis not present

## 2018-12-04 NOTE — Telephone Encounter (Signed)
Telephone encounter created in error.  Mady Haagensen, PT 12/04/18 4:30 PM Phone: [phone] Fax: [phone]

## 2018-12-04 NOTE — Patient Instructions (Signed)
Access Code: [email or web address]  URL: [email or web address]  Date: 12/04/2018  Prepared by: Mady Haagensen   Exercises  Standing Gluteal Sets - 2 sets - 3 sec hold - 1x daily - 5x weekly  Seated Long Arc Quad - 10 reps - 2 sets - 1-2x daily - 5x weekly  Seated March - 10 reps - 2 sets - 1-2x daily - 5x weekly  Seated Heel Slide - 10 reps - 2 sets - 1-2x daily - 5x weekly  Seated Hamstring Stretch - 3 reps - 1 sets - 30 sec hold - 1-2x daily - 5x weekly   Added 12/04/2018 Seated Hip Flexion Toward Target - 5 reps - 2 sets - 1x daily - 5x weekly  Sit to Stand with Armchair - 5 reps - 2 sets - 1x daily - 5x weekly

## 2018-12-04 NOTE — Therapy (Addendum)
[facility] [address] [place], [place], [address] Phone: [phone]   Fax:  [phone]  Physical Therapy Treatment  Patient Details  Name: Shelly Mcdaniel MRN: [id number] Date of Birth: 1955-11-28 Referring Provider (PT): Kirsteins   Encounter Date: 12/04/2018  CLINIC OPERATION CHANGES: Outpatient Neuro Rehab is open at lower capacity following universal masking, social distancing, and patient screening.  The patient's COVID risk of complications score is 5.   PT End of Session - 12/04/18 1650    Visit Number  6    Number of Visits  8    Date for PT Re-Evaluation  01/14/19    Authorization Type  Medicare/BCBS    PT Start Time  1507    PT Stop Time  1550    PT Time Calculation (min)  43 min    Equipment Utilized During Treatment  Gait belt    Activity Tolerance  Patient tolerated treatment well    Behavior During Therapy  WFL for tasks assessed/performed       Past Medical History:  Diagnosis Date  . Diabetes mellitus without complication ([place])   . Hyperlipidemia   . Hypertension   . Stroke ([place])   . UTI (urinary tract infection)     Past Surgical History:  Procedure Laterality Date  . BACK SURGERY    . BREAST SURGERY    . COLONOSCOPY WITH PROPOFOL N/A 12/24/2013   Procedure: COLONOSCOPY WITH PROPOFOL;  Surgeon: [name], MD;  Location: [facility];  Service: Endoscopy;  Laterality: N/A;  . RIGHT/LEFT HEART CATH AND CORONARY ANGIOGRAPHY N/A 08/10/2017   Procedure: RIGHT/LEFT HEART CATH AND CORONARY ANGIOGRAPHY;  Surgeon: Troy Sine, MD;  Location: [facility];  Service: Cardiovascular;  Laterality: N/A;    There were no vitals filed for this visit.  Subjective Assessment - 12/04/18 1511    Subjective  Doing better than last week.  Was in the bed for several days last week.  (Was tested at Dr. Carlyle Lipa office for COVID-19 and was negative, per pt report).    Patient is  accompained by:  Family member   Husband   Pertinent History  HTN, DM, Hyperlipidemia, CVA (01/2016, 03/2016), hx of UTI, cardiomyopathy, CHF    Limitations  Standing;Walking    Patient Stated Goals  Pt's goals for physical therapy are to improve transfers, building strength back in legs.    Currently in Pain?  No/denies                       [facility] Adult PT Treatment/Exercise - 12/04/18 0001      Transfers   Transfers  Sit to Stand;Stand to Sit;Stand Pivot Transfers    Sit to Stand  With upper extremity assist;With armrests;From bed;From chair/3-in-1;4: Min assist    Sit to Stand Details  Verbal cues for technique;Verbal cues for sequencing    Sit to Stand Details (indicate cue type and reason)  Cues for increased forward lean, weight through feet, then reach for locked w/c to stand    Stand to Sit  4: Min assist;With upper extremity assist;With armrests;To elevated surface;To chair/3-in-1    Stand to Sit Details (indicate cue type and reason)  Verbal cues for technique;Verbal cues for sequencing    Stand Pivot Transfers  3: Mod assist;4: Min assist    Stand Pivot Transfer Details (indicate cue type and reason)  w/c<>mat, cues for hand placement, for increased forward lean    Comments  Standing at locked w/c x 3 minutes (needs to sit due to back fatigue, pain)      Posture/Postural Control   Posture Comments  Seated postural exercises:  forward lean to upright sit, x 5 reps, 2 sets.  Lateral weightshifting 2 sets x 5 reps      Knee/Hip Exercises: Aerobic   Stepper  Seated SciFit Stepper, level 1, lower extremities only x 8 minutes, for flexibility, strengthening.  Cues to keep LLE from internally rotating.  No c/o pain in L knee with SciFit Stepper.       Practiced sit to stand from 24" mat height, x 3 reps.  Then sit<>stand from elevated mat height x 3 reps.  Then 2nd bout of lowered 24" height, x 3 reps.  Pt requires cues for increased forward lean and correct foot  placement.       PT Education - 12/04/18 1703    Education Details  HEP additions today (modifications to sit<>stand technique)    Person(s) Educated  Patient    Methods  Explanation;Demonstration;Handout;Verbal cues    Comprehension  Verbalized understanding;Returned demonstration       PT Short Term Goals - 11/12/18 1313      PT SHORT TERM GOAL #1   Title  Pt will perform HEP for improved flexibility, strength, standing balance, with husband's assistance.  TARGET 11/09/2018    Time  4    Period  Weeks    Status  Achieved    Target Date  11/09/18      PT SHORT TERM GOAL #2   Title  Pt will perform stand pivot transfer with min assist level in order to increase independence at home.      Baseline  min assist 11/12/2018    Time  4    Period  Weeks    Status  Achieved    Target Date  11/09/18      PT SHORT TERM GOAL #3   Title  Pt will perform sit to stand with min assist for improved independence with transfers in the home.    Time  4    Period  Weeks    Status  Not Met    Target Date  11/09/18      PT SHORT TERM GOAL #4   Title  Pt will perform standing activities with 1 UE support at sink, with supervision for at least 5 minutes, to ease caregiver burden/improved participation in ADLs.    Baseline  2:30 at sink 11/12/2018; reports up to 4 minutes at home    Time  4    Period  Weeks    Status  Not Met    Target Date  11/09/18        PT Long Term Goals - 10/17/18 0900      PT LONG TERM GOAL #1   Title  Pt will perform progression of HEP for improved flexibility, strength, balance, with husband's assistance.  TARGET 12/07/2018    Time  8    Period  Weeks    Status  New    Target Date  12/07/18      PT LONG TERM GOAL #2   Title  Pt will perform stand pivot transfer at supervision level for improved independence with transfers, decreased caregiver burden.    Time  8    Period  Weeks    Status  New    Target Date  12/07/18      PT LONG TERM GOAL #3  Title  Pt  will perform sit to stand with supervision, for improved independence with transfers and decreased caregiver burden.    Time  8    Period  Weeks    Status  New    Target Date  12/07/18      PT LONG TERM GOAL #4   Title  Pt will ambulate x 25 ft w/ UPWalker at min A level consistently in order to indicate increased independence with ADLs at home.     Time  8    Period  Weeks    Status  New    Target Date  12/07/18            Plan - 12/04/18 1651    Clinical Impression Statement  Pt with no c/o knee pain today, and pt able to tolerate practice with sit<>stand from mat surfaces, seated trunk and seated SciFit lower extremity exercises without pain.  Pt feels part of transfer difficulty is fear.  Focused on correct technique, increased forward lean to initiate transfers, and able to perform with successively less assist each time.  Pt will benefit from skilled PT to address transfer training, strengthening, and education to husband on transfer training techniques and cues.    Personal Factors and Comorbidities  Comorbidity 3+;Time since onset of injury/illness/exacerbation    Comorbidities  HTN, DM, Hyperlipidemia, CVA (01/2016, 03/2016), hx of UTI, cardiomyopathy, CHF    Examination-Activity Limitations  Stand;Locomotion Level;Transfers    Examination-Participation Restrictions  Community Activity    Stability/Clinical Decision Making  Evolving/Moderate complexity    Rehab Potential  Fair    PT Frequency  1x / week    PT Duration  8 weeks   including eval day   PT Treatment/Interventions  ADLs/Self Care Home Management;Therapeutic exercise;Therapeutic activities;Functional mobility training;Gait training;DME Instruction;Balance training;Neuromuscular re-education;Patient/family education;Orthotic Fit/Training    PT Next Visit Plan  Have husband come back for PT session, educate on transfer technique, cues, safety; sit<>stand from elevated height, chair/mat push-ups, SciFit (look at POC  and likely d/c at end of scheduling appts?)    Consulted and Agree with Plan of Care  Patient;Family member/caregiver    Family Member Consulted  Husband (at beginning of session)       Patient will benefit from skilled therapeutic intervention in order to improve the following deficits and impairments:  Decreased mobility, Decreased strength, Decreased balance, Decreased endurance, Abnormal gait, Difficulty walking, Impaired tone  Visit Diagnosis: 1. Muscle weakness (generalized)        Problem List Patient Active Problem List   Diagnosis Date Noted  . Cardiomyopathy ([place])   . Coronary artery disease involving native coronary artery of native heart without angina pectoris   . Ischemic cardiomyopathy 07/04/2017  . 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 Orthostatic hypotension 04/08/2016  . Muscle cramps 04/08/2016  . Right pontine cerebrovascular accident ([place]) 04/08/2016  . Spastic hemiparesis of left nondominant side ([place]) 04/08/2016  . Cerebral thrombosis with cerebral infarction 04/06/2016  . Basilar artery stenosis   . Dysarthria 04/05/2016  . History of CVA with residual deficit 04/05/2016  . Sinus tachycardia 04/05/2016  . Acute encephalopathy 04/05/2016  . Chronic pain 04/05/2016  . Other hyperlipidemia 04/05/2016  . Ischemic stroke ([place])   . 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 TIA (transient ischemic attack) 11/07/2014  . Uncontrolled hypertension 11/07/2014  . Controlled type 2 diabetes mellitus with diabetic nephropathy ([place]) 11/07/2014    Damyon Mullane W. 12/04/2018, 5:03 PM Frazier Butt., PT  [facility] [address] [place], [place], [address] Phone: [phone]   Fax:  [phone]  Name: Shelly Mcdaniel MRN: [id number] Date of Birth: 12/13/1955

## 2018-12-07 ENCOUNTER — Ambulatory Visit: Payer: Medicare Other | Admitting: Occupational Therapy

## 2018-12-07 ENCOUNTER — Telehealth: Payer: Self-pay | Admitting: Occupational Therapy

## 2018-12-07 NOTE — Telephone Encounter (Signed)
Message left for pt/ husband regarding pt's missed appointment today and that her next scheduled appointment is 12/13/18 at 8 and 9 am. therapist requested that pt/ husband call if pt is unable to attend her appointment.

## 2018-12-13 ENCOUNTER — Ambulatory Visit: Payer: Medicare Other | Attending: Physical Medicine & Rehabilitation | Admitting: Occupational Therapy

## 2018-12-13 ENCOUNTER — Ambulatory Visit: Payer: Medicare Other | Admitting: Physical Therapy

## 2018-12-13 ENCOUNTER — Encounter: Payer: Self-pay | Admitting: Physical Therapy

## 2018-12-13 ENCOUNTER — Other Ambulatory Visit: Payer: Self-pay

## 2018-12-13 ENCOUNTER — Encounter: Payer: Self-pay | Admitting: Occupational Therapy

## 2018-12-13 DIAGNOSIS — I69354 Hemiplegia and hemiparesis following cerebral infarction affecting left non-dominant side: Secondary | ICD-10-CM

## 2018-12-13 DIAGNOSIS — R2689 Other abnormalities of gait and mobility: Secondary | ICD-10-CM | POA: Insufficient documentation

## 2018-12-13 DIAGNOSIS — M25642 Stiffness of left hand, not elsewhere classified: Secondary | ICD-10-CM | POA: Diagnosis present

## 2018-12-13 DIAGNOSIS — R29898 Other symptoms and signs involving the musculoskeletal system: Secondary | ICD-10-CM | POA: Diagnosis present

## 2018-12-13 DIAGNOSIS — R2681 Unsteadiness on feet: Secondary | ICD-10-CM | POA: Insufficient documentation

## 2018-12-13 DIAGNOSIS — M25612 Stiffness of left shoulder, not elsewhere classified: Secondary | ICD-10-CM | POA: Diagnosis present

## 2018-12-13 DIAGNOSIS — M25632 Stiffness of left wrist, not elsewhere classified: Secondary | ICD-10-CM | POA: Diagnosis present

## 2018-12-13 DIAGNOSIS — M6281 Muscle weakness (generalized): Secondary | ICD-10-CM | POA: Diagnosis present

## 2018-12-13 DIAGNOSIS — R293 Abnormal posture: Secondary | ICD-10-CM | POA: Diagnosis present

## 2018-12-13 NOTE — Patient Instructions (Signed)
   Use a pool noodle cut about 18".  Sit and hold in both hands, then push noodle out along legs.  Repeat 10 times 3x/day.

## 2018-12-13 NOTE — Therapy (Signed)
[facility] [name] [name], [place], [address] Phone: [phone]   Fax:  [phone]  Physical Therapy Treatment  Patient Details  Name: Shelly Mcdaniel MRN: [id number] Date of Birth: 1955/11/03 Referring Provider (PT): Kirsteins   Encounter Date: 12/13/2018  CLINIC OPERATION CHANGES: Outpatient [facility] Rehab is open at lower capacity following universal masking, social distancing, and patient screening.  The patient's COVID risk of complications score is 5.   PT End of Session - 12/13/18 1002    Visit Number  7    Number of Visits  8    Date for PT Re-Evaluation  01/14/19    Authorization Type  Medicare/BCBS    PT Start Time  0855    PT Stop Time  0942    PT Time Calculation (min)  47 min    Equipment Utilized During Treatment  Gait belt    Activity Tolerance  Patient tolerated treatment well    Behavior During Therapy  WFL for tasks assessed/performed       Past Medical History:  Diagnosis Date  . Diabetes mellitus without complication ([place])   . Hyperlipidemia   . Hypertension   . Stroke ([place])   . UTI (urinary tract infection)     Past Surgical History:  Procedure Laterality Date  . BACK SURGERY    . BREAST SURGERY    . COLONOSCOPY WITH PROPOFOL N/A 12/24/2013   Procedure: COLONOSCOPY WITH PROPOFOL;  Surgeon: [name], MD;  Location: [facility];  Service: Endoscopy;  Laterality: N/A;  . RIGHT/LEFT HEART CATH AND CORONARY ANGIOGRAPHY N/A 08/10/2017   Procedure: RIGHT/LEFT HEART CATH AND CORONARY ANGIOGRAPHY;  Surgeon: Troy Sine, MD;  Location: [facility];  Service: Cardiovascular;  Laterality: N/A;    There were no vitals filed for this visit.  Subjective Assessment - 12/13/18 0857    Subjective  I definitely feel like I'm stronger.  My husband says he does at most, 25%, most times less.  Always have harder time with first transfer, then it gets better.    Patient is  accompained by:  Family member   Husband   Pertinent History  HTN, DM, Hyperlipidemia, CVA (01/2016, 03/2016), hx of UTI, cardiomyopathy, CHF    Limitations  Standing;Walking    Patient Stated Goals  Pt's goals for physical therapy are to improve transfers, building strength back in legs.    Currently in Pain?  No/denies                       [facility] Adult PT Treatment/Exercise - 12/13/18 0001      Transfers   Transfers  Sit to Stand;Stand to Sit;Stand Pivot Transfers    Sit to Stand  With upper extremity assist;With armrests;From bed;From chair/3-in-1;4: Min assist    Sit to Stand Details  Verbal cues for technique;Verbal cues for sequencing    Sit to Stand Details (indicate cue type and reason)  Cues for increased forward lean, feet positioning, hand placement    Stand to Sit  4: Min assist;With upper extremity assist;With armrests;To elevated surface;To chair/3-in-1    Stand to Sit Details (indicate cue type and reason)  Verbal cues for technique;Verbal cues for sequencing    Stand Pivot Transfers  4: Min assist    Stand Pivot Transfer Details (indicate cue type and reason)  w/c <>mat with cues for hand placement, sequence    Comments  Discussed with husband, ways that pt/husband can make  transfer go smoother; to verbalize sequence, to verbalize counting sequence so they are coordinated for the transfer.      Ambulation/Gait   Ambulation/Gait  Yes    Ambulation/Gait Assistance  4: Min assist;3: Mod assist    Ambulation/Gait Assistance Details  Using UP Walker, pt requires max assist fro LUE placement on walker's forearm rest and handle    Ambulation Distance (Feet)  115 Feet   50   Assistive device  Other (Comment)   UPWalker   Gait Pattern  Step-to pattern;Decreased step length - left;Decreased step length - right;Decreased stride length;Right flexed knee in stance;Left flexed knee in stance;Narrow base of support    Ambulation Surface  Level;Indoor    Stairs  Yes     Stairs Assistance  3: Mod assist    Stair Management Technique  One rail Right;Step to pattern;Forwards    Number of Stairs  4    Height of Stairs  6      Knee/Hip Exercises: Seated   Heel Slides  AAROM;Left;5 reps;2 sets   pillow case under L foot   Other Seated Knee/Hip Exercises  Seated marching step out and in, 10 reps, for improved hip abduction; then use of pillow case under L foot, slide out and in, x 8 reps for improved hip abductor strengthening    Marching  AROM;Strengthening;Left;1 set;5 reps             PT Education - 12/13/18 1001    Education Details  HEP additions; education to husband on transfer cueing/sequencing    Person(s) Educated  Patient;Spouse    Methods  Explanation;Demonstration;Verbal cues;Handout    Comprehension  Verbalized understanding;Returned demonstration;Verbal cues required;Need further instruction       PT Short Term Goals - 11/12/18 1313      PT SHORT TERM GOAL #1   Title  Pt will perform HEP for improved flexibility, strength, standing balance, with husband's assistance.  TARGET 11/09/2018    Time  4    Period  Weeks    Status  Achieved    Target Date  11/09/18      PT SHORT TERM GOAL #2   Title  Pt will perform stand pivot transfer with min assist level in order to increase independence at home.      Baseline  min assist 11/12/2018    Time  4    Period  Weeks    Status  Achieved    Target Date  11/09/18      PT SHORT TERM GOAL #3   Title  Pt will perform sit to stand with min assist for improved independence with transfers in the home.    Time  4    Period  Weeks    Status  Not Met    Target Date  11/09/18      PT SHORT TERM GOAL #4   Title  Pt will perform standing activities with 1 UE support at sink, with supervision for at least 5 minutes, to ease caregiver burden/improved participation in ADLs.    Baseline  2:30 at sink 11/12/2018; reports up to 4 minutes at home    Time  4    Period  Weeks    Status  Not Met    Target  Date  11/09/18        PT Long Term Goals - 12/13/18 1005      PT LONG TERM GOAL #1   Title  Pt will perform progression of HEP for improved flexibility,  strength, balance, with husband's assistance.  TARGET 12/07/2018    Time  8    Period  Weeks    Status  On-going      PT LONG TERM GOAL #2   Title  Pt will perform stand pivot transfer at supervision level for improved independence with transfers, decreased caregiver burden.    Time  8    Period  Weeks    Status  On-going      PT LONG TERM GOAL #3   Title  Pt will perform sit to stand with supervision, for improved independence with transfers and decreased caregiver burden.    Time  8    Period  Weeks    Status  On-going      PT LONG TERM GOAL #4   Title  Pt will ambulate x 25 ft w/ UPWalker at min A level consistently in order to indicate increased independence with ADLs at home.     Time  8    Period  Weeks    Status  On-going            Plan - 12/13/18 1002    Clinical Impression Statement  Pt reports feeling stronger and noted improved functional strength with transfers as well as improved tolerance, ability for gait.  She is able to perform 2 bouts of gait today (115 ft, 50 ft) and stair negotiation, as pt's new townhome as 5 steps and ramp has not yet been built.  Pt is on target to meet LTGs, and may benefit from additional skilled PT to further address strength, transfers, gait for improved overall mobility.    Personal Factors and Comorbidities  Comorbidity 3+;Time since onset of injury/illness/exacerbation    Comorbidities  HTN, DM, Hyperlipidemia, CVA (01/2016, 03/2016), hx of UTI, cardiomyopathy, CHF    Examination-Activity Limitations  Stand;Locomotion Level;Transfers    Examination-Participation Restrictions  Community Activity    Stability/Clinical Decision Making  Evolving/Moderate complexity    Rehab Potential  Fair    PT Frequency  1x / week    PT Duration  8 weeks   including eval day   PT  Treatment/Interventions  ADLs/Self Care Home Management;Therapeutic exercise;Therapeutic activities;Functional mobility training;Gait training;DME Instruction;Balance training;Neuromuscular re-education;Patient/family education;Orthotic Fit/Training    PT Next Visit Plan  review updates to HEP, how has verbalizing transfer sequence gone?; Check LTGs; based on progress today and family moving to town to assist more, may renew next session    Consulted and Agree with Plan of Care  Patient;Family member/caregiver    Family Member Consulted  Husband       Patient will benefit from skilled therapeutic intervention in order to improve the following deficits and impairments:  Decreased mobility, Decreased strength, Decreased balance, Decreased endurance, Abnormal gait, Difficulty walking, Impaired tone  Visit Diagnosis: 1. Muscle weakness (generalized)   2. Unsteadiness on feet   3. Other abnormalities of gait and mobility        Problem List Patient Active Problem List   Diagnosis Date Noted  . Cardiomyopathy ([place])   . Coronary artery disease involving native coronary artery of native heart without angina pectoris   . Ischemic cardiomyopathy 07/04/2017  . 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 Orthostatic hypotension 04/08/2016  . Muscle cramps 04/08/2016  . Right pontine cerebrovascular accident ([place]) 04/08/2016  . Spastic hemiparesis of left nondominant side ([place]) 04/08/2016  . Cerebral thrombosis with cerebral infarction 04/06/2016  . Basilar artery stenosis   . Dysarthria 04/05/2016  . History of  CVA with residual deficit 04/05/2016  . Sinus tachycardia 04/05/2016  . Acute encephalopathy 04/05/2016  . Chronic pain 04/05/2016  . Other hyperlipidemia 04/05/2016  . Ischemic stroke ([place])   . 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 TIA (transient ischemic attack) 11/07/2014  . Uncontrolled hypertension 11/07/2014  . Controlled type 2 diabetes mellitus with diabetic nephropathy ([place])  11/07/2014    MARRIOTT,AMY W. 12/13/2018, 10:06 AM  Frazier Butt., PT   [facility] [address] [place], [place], [address] Phone: [phone]   Fax:  [phone]  Name: CLEMMIE BUELNA MRN: [id number] Date of Birth: [birth year]

## 2018-12-13 NOTE — Patient Instructions (Signed)
TRANSFERS:  -Make sure to talk through the sequence of the transfer, or do a "practice" trial of the transfer -Talk through the sequence of the transfer, so that you and [name] are on the same page for the transfer    Seated stepping in and out exercise: -Scoot to the edge of the mat or chair -Step your left leg out and in, 5-10 reps

## 2018-12-13 NOTE — Therapy (Signed)
[facility] [address] [place], [place], [address] Phone: [phone]   Fax:  [phone]  Occupational Therapy Treatment  Patient Details  Name: Shelly SaucierJody H Mcdaniel MRN: [id number] Date of Birth: Dec 07, 1955 Referring Provider (OT): Dr. Claudette LawsAndrew Kirsteins   Encounter Date: 12/13/2018  OT End of Session - 12/13/18 0812    Visit Number  3    Number of Visits  9    Date for OT Re-Evaluation  12/15/18    Authorization Type  11/19/60-9/5/285/5/20-01/15/19 cert.  Medicare / BCBS    Authorization - Visit Number  3    Authorization - Number of Visits  10    OT Start Time  [phone]    OT Stop Time  0845    OT Time Calculation (min)  42 min    Activity Tolerance  Patient tolerated treatment well    Behavior During Therapy  Flat affect       Past Medical History:  Diagnosis Date  . Diabetes mellitus without complication ([facility])   . Hyperlipidemia   . Hypertension   . Stroke ([facility])   . UTI (urinary tract infection)     Past Surgical History:  Procedure Laterality Date  . BACK SURGERY    . BREAST SURGERY    . COLONOSCOPY WITH PROPOFOL N/A 12/24/2013   Procedure: COLONOSCOPY WITH PROPOFOL;  Surgeon: Charolett BumpersMartin K Johnson, MD;  Location: [facility];  Service: Endoscopy;  Laterality: N/A;  . RIGHT/LEFT HEART CATH AND CORONARY ANGIOGRAPHY N/A 08/10/2017   Procedure: RIGHT/LEFT HEART CATH AND CORONARY ANGIOGRAPHY;  Surgeon: Lennette BihariKelly, Thomas A, MD;  [facility];  Service: Cardiovascular;  Laterality: N/A;    There were no vitals filed for this visit.  Subjective Assessment - 12/13/18 0809    Subjective   Pt reports that she is going through her house and getting ready to move ([place]  Pt reports she can now don/doff shirt mod I.   Pt reports that R hand feels looser after the botox.  Husband/pt feel asleep and missed last scheduled appt.    Patient is accompanied by:  Family member   husband   Pertinent History  Chronic  right pontine infarct with left hemiparesis and spasticity 01/2016.  PMH:  HTN, DM, orthostatic hypotension, hx of adhesive capsulitis L shoulder, HDL, cardiomyopathy, s/p Botox 08/14/18.      Limitations  fall risk    Patient Stated Goals  to be able to move L hand and squeeze stronger    Currently in Pain?  No/denies   during session      CLINIC OPERATION CHANGES: Outpatient Neuro Rehab is open at lower capacity following universal masking, social distancing, and patient screening.  The patient's COVID risk of complications score is 5.  Transfer w/c>mat with mod A.  In supine, PROM elbow ext, shoulder flex, shoulder ER, supination, finger ext.  Supine>sitting with mod A.  In sitting, continue stretching with focus on neutral LUE positioning.  Reviewed proper positioning of shoulder to decr risk of pain and decr spasticity.    Practiced using L hand as stabilizer for opening bottle.  Pt able to do so if LUE supported.  Instructed pt to do this as much as possible at home.  Pt verbalized understanding.  Pt reports using LUE as stabilizer for cutting board/vegetables.     Began checking goals--see goals section below.     OT Education - 12/13/18 [id number]    Education Details  Additional exercise for  HEP with foam noodle--see pt instructions    Person(s) Educated  Patient    Methods  Explanation;Demonstration;Verbal cues;Handout;Tactile cues    Comprehension  Verbalized understanding;Returned demonstration       OT Short Term Goals - 12/13/18 0901      OT SHORT TERM GOAL #1   Title  Pt will be independnt with updated splint wear/care for spasticity management and improved positioning.--check STGs 11/15/18    Time  4    Period  Weeks    Status  New   12/13/18:  has not brought in current splint for assessment     OT SHORT TERM GOAL #2   Title  Pt/family to verbalize understanding with proper positioning of LUE during activity and in bed to decr risk of future complications including  pain.    Time  4    Period  Weeks    Status  On-going      OT SHORT TERM GOAL #3   Title  ----      OT SHORT TERM GOAL #4   Title  ----      OT SHORT TERM GOAL #5   Title  ----        OT Long Term Goals - 12/13/18 0827      OT LONG TERM GOAL #1   Title  Pt/ caregiver will be independent with updated HEP--check LTGs 12/15/18    Time  8    Period  Weeks    Status  New      OT LONG TERM GOAL #2   Title  Pt will demo improved PROM LUE to be able to position LUE appropriately on up walker in prep for short distance ambulation.    Time  8    Period  Weeks    Status  New      OT LONG TERM GOAL #3   Title  Pt will demo at least 80* PROM L shoulder flex without pain for incr ease/safety with hygiene and UB dressing.    Time  8    Period  Weeks    Status  Achieved   12/13/18:  80-90* in supine     OT LONG TERM GOAL #4   Title  Pt will be able to use LUE as stabilizer for selected ADL/IADL tasks.    Time  8    Period  Weeks    Status  Achieved      OT LONG TERM GOAL #5   Title  ---            Plan - 12/13/18 [id number]    Clinical Impression Statement  Pt demo improved PROM today and decr tone s/p Botox.  Pt reports less pain and improved positioning.  PT and pt reports improved ability to position LUE for walker use.  However, due to moving, pt/husband was unable to locate splint for check.  Pt to bring next session.    OT Occupational Profile and History  Detailed Assessment- Review of Records and additional review of physical, cognitive, psychosocial history related to current functional performance    Occupational performance deficits (Please refer to evaluation for details):  ADL's;IADL's;Leisure;Social Participation    Body Structure / Function / Physical Skills  ADL;GMC;UE functional use;Coordination;IADL;Pain;Strength;Improper spinal/pelvic alignment;Body mechanics;Decreased knowledge of use of DME;Flexibility;Mobility;ROM;Tone    Rehab Potential  Fair    Clinical  Decision Making  Several treatment options, min-mod task modification necessary    Comorbidities Affecting Occupational Performance:  Presence of comorbidities impacting occupational  performance    Comorbidities impacting occupational performance description:  hx of adhesive capsulitis L shoulder, orthostatic hypotension, cardiomyopathy    Modification or Assistance to Complete Evaluation   Min-Moderate modification of tasks or assist with assess necessary to complete eval    OT Frequency  1x / week    OT Duration  8 weeks   +eval over 12 weeks    OT Treatment/Interventions  Self-care/ADL training;Therapeutic exercise;Patient/family education;Splinting;Neuromuscular education;Moist Heat;Fluidtherapy;Functional Mobility Training;Energy conservation;Therapeutic activities;Cryotherapy;Ultrasound;Contrast Bath;DME and/or AE instruction;Manual Therapy;Passive range of motion    Plan  check splint and modify prn/fabricate custom resting hand splint if needed (did not bring in today) and instruct in wear/care schedule    OT Home Exercise Plan  Education provided:  initial HEP issued 10/16/18    Recommended Other Services  current with PT     Consulted and Agree with Plan of Care  Patient;Family member/caregiver    Family Member Consulted  husband       Patient will benefit from skilled therapeutic intervention in order to improve the following deficits and impairments:   Body Structure / Function / Physical Skills: ADL, GMC, UE functional use, Coordination, IADL, Pain, Strength, Improper spinal/pelvic alignment, Body mechanics, Decreased knowledge of use of DME, Flexibility, Mobility, ROM, Tone       Visit Diagnosis: 1. Hemiplegia and hemiparesis following cerebral infarction affecting left non-dominant side (HCC)   2. Other symptoms and signs involving the musculoskeletal system   3. Abnormal posture   4. Stiffness of left shoulder, not elsewhere classified   5. Stiffness of left hand, not  elsewhere classified   6. Other abnormalities of gait and mobility       Problem List Patient Active Problem List   Diagnosis Date Noted  . Cardiomyopathy ([place])   . Coronary artery disease involving native coronary artery of native heart without angina pectoris   . Ischemic cardiomyopathy 07/04/2017  . 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 Orthostatic hypotension 04/08/2016  . Muscle cramps 04/08/2016  . Right pontine cerebrovascular accident ([place]) 04/08/2016  . Spastic hemiparesis of left nondominant side ([place]) 04/08/2016  . Cerebral thrombosis with cerebral infarction 04/06/2016  . Basilar artery stenosis   . Dysarthria 04/05/2016  . History of CVA with residual deficit 04/05/2016  . Sinus tachycardia 04/05/2016  . Acute encephalopathy 04/05/2016  . Chronic pain 04/05/2016  . Other hyperlipidemia 04/05/2016  . Ischemic stroke ([place])   . 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 TIA (transient ischemic attack) 11/07/2014  . Uncontrolled hypertension 11/07/2014  . Controlled type 2 diabetes mellitus with diabetic nephropathy ([place]) 11/07/2014    [facility] 12/13/2018, 10:03 AM  [facility] [address] [place], [place], [address] Phone: [phone]   Fax:  [phone]  Name: Shelly Mcdaniel MRN: [id number] Date of Birth: 1955-08-26   Vianne Bulls, OTR/L [facility] [address] [place], [place]  [phone] phone [phone] 12/13/18 10:03 AM

## 2018-12-20 ENCOUNTER — Encounter: Payer: Self-pay | Admitting: Physical Therapy

## 2018-12-20 ENCOUNTER — Other Ambulatory Visit: Payer: Self-pay

## 2018-12-20 ENCOUNTER — Ambulatory Visit: Payer: Medicare Other | Admitting: Occupational Therapy

## 2018-12-20 ENCOUNTER — Encounter: Payer: Self-pay | Admitting: Occupational Therapy

## 2018-12-20 ENCOUNTER — Ambulatory Visit: Payer: Medicare Other | Admitting: Physical Therapy

## 2018-12-20 DIAGNOSIS — R29898 Other symptoms and signs involving the musculoskeletal system: Secondary | ICD-10-CM

## 2018-12-20 DIAGNOSIS — R2681 Unsteadiness on feet: Secondary | ICD-10-CM

## 2018-12-20 DIAGNOSIS — I69354 Hemiplegia and hemiparesis following cerebral infarction affecting left non-dominant side: Secondary | ICD-10-CM | POA: Diagnosis not present

## 2018-12-20 DIAGNOSIS — M6281 Muscle weakness (generalized): Secondary | ICD-10-CM

## 2018-12-20 DIAGNOSIS — M25642 Stiffness of left hand, not elsewhere classified: Secondary | ICD-10-CM

## 2018-12-20 DIAGNOSIS — M25632 Stiffness of left wrist, not elsewhere classified: Secondary | ICD-10-CM

## 2018-12-20 DIAGNOSIS — R2689 Other abnormalities of gait and mobility: Secondary | ICD-10-CM

## 2018-12-20 DIAGNOSIS — M25612 Stiffness of left shoulder, not elsewhere classified: Secondary | ICD-10-CM

## 2018-12-20 DIAGNOSIS — R293 Abnormal posture: Secondary | ICD-10-CM

## 2018-12-20 NOTE — Therapy (Signed)
[facility] [address] [place], [place], [address] Phone: [phone]   Fax:  [phone]  Physical Therapy Treatment  Patient Details  Name: Shelly Mcdaniel MRN: [id number] Date of Birth: 1956-06-12 Referring Provider (PT): Kirsteins   Encounter Date: 12/20/2018  CLINIC OPERATION CHANGES: Outpatient Neuro Rehab is open at lower capacity following universal masking, social distancing, and patient screening.  The patient's COVID risk of complications score is 5.   PT End of Session - 12/20/18 1136    Visit Number  8    Number of Visits  16    Date for PT Re-Evaluation  03/20/19   per renewal 12/20/2018   Authorization Type  Medicare/BCBS    Authorization Time Period  12/20/2018-03/20/2019    PT Start Time  0905    PT Stop Time  0945    PT Time Calculation (min)  40 min    Equipment Utilized During Treatment  Gait belt    Activity Tolerance  Patient tolerated treatment well    Behavior During Therapy  WFL for tasks assessed/performed       Past Medical History:  Diagnosis Date  . Diabetes mellitus without complication ([place])   . Hyperlipidemia   . Hypertension   . Stroke ([place])   . UTI (urinary tract infection)     Past Surgical History:  Procedure Laterality Date  . BACK SURGERY    . BREAST SURGERY    . COLONOSCOPY WITH PROPOFOL N/A 12/24/2013   Procedure: COLONOSCOPY WITH PROPOFOL;  Surgeon: [name], MD;  Location: [facility];  Service: Endoscopy;  Laterality: N/A;  . RIGHT/LEFT HEART CATH AND CORONARY ANGIOGRAPHY N/A 08/10/2017   Procedure: RIGHT/LEFT HEART CATH AND CORONARY ANGIOGRAPHY;  Surgeon: Troy Sine, MD;  Location: [facility];  Service: Cardiovascular;  Laterality: N/A;    There were no vitals filed for this visit.  Subjective Assessment - 12/20/18 0907    Subjective  Woke up at 4 am this morning and had very low blood sugar (42) and then rebounded to 70 and 120.  I just feel  kind of yucky with low energy.    Patient is accompained by:  Family member   Husband   Pertinent History  HTN, DM, Hyperlipidemia, CVA (01/2016, 03/2016), hx of UTI, cardiomyopathy, CHF    Limitations  Standing;Walking    Patient Stated Goals  Pt's goals for physical therapy are to improve transfers, building strength back in legs.    Currently in Pain?  No/denies                       [facility] Adult PT Treatment/Exercise - 12/20/18 0001      Transfers   Transfers  Sit to Stand;Stand to Sit;Stand Pivot Transfers    Sit to Stand  With upper extremity assist;With armrests;From bed;From chair/3-in-1;4: Min assist    Sit to Stand Details  Verbal cues for technique;Verbal cues for sequencing    Sit to Stand Details (indicate cue type and reason)  Pt able to verbalize sequence for transfers prior to initiating transfer    Stand to Sit  4: Min assist;With upper extremity assist;With armrests;To elevated surface;To chair/3-in-1    Stand to Sit Details (indicate cue type and reason)  Verbal cues for technique;Verbal cues for sequencing    Stand Pivot Transfers  4: Min assist    Stand Pivot Transfer Details (indicate cue type and reason)  w/c <>mat, pt able to verbalize  transfer sequence    Number of Reps  --         Ambulation/Gait   Ambulation/Gait  Yes    Ambulation/Gait Assistance  4: Min assist    Ambulation/Gait Assistance Details  Using UP Walker, max assist to place LUE on armrest.    Ambulation Distance (Feet)  115 Feet    Assistive device  Other (Comment)   UP Walker   Gait Pattern  Step-to pattern;Decreased step length - left;Decreased step length - right;Decreased stride length;Right flexed knee in stance;Left flexed knee in stance;Narrow base of support    Ambulation Surface  Level;Indoor      Self-Care   Self-Care  Other Self-Care Comments    Other Self-Care Comments   Discussed progress towards goals, POC, and goals that pt wants to work on going forward.   Reviewed transfers, and pt reports that it is working well to verbalize transfer sequence.   Discussed options for walking at home and discussed improtance of HEP.      Knee/Hip Exercises: Aerobic   Stepper  Seated SciFit Stepper, level 1.2, lower extremities only x 5 minutes, for flexibility, strengthening.  Cues to keep LLE from internally rotating.  No c/o pain in L knee with SciFit Stepper.      Knee/Hip Exercises: Seated   Heel Slides  AAROM;Left;5 reps;1 set   used pillow case   Other Seated Knee/Hip Exercises  Seated marching LLE only, x 10 reps, then slide LLE out and in, 5 reps, for improved hip abduction; then use of pillow case under L foot, slide out and in, x 8 reps for improved hip abductor strengthening    Sit to Sand  --   2 reps from mat-reviewed as part of HEP            PT Education - 12/20/18 1135    Education Details  Progress towards goals, POC-in agreement to continue PT    Person(s) Educated  Patient    Methods  Explanation    Comprehension  Verbalized understanding       PT Short Term Goals - 11/12/18 1313      PT SHORT TERM GOAL #1   Title  Pt will perform HEP for improved flexibility, strength, standing balance, with husband's assistance.  TARGET 11/09/2018    Time  4    Period  Weeks    Status  Achieved    Target Date  11/09/18      PT SHORT TERM GOAL #2   Title  Pt will perform stand pivot transfer with min assist level in order to increase independence at home.      Baseline  min assist 11/12/2018    Time  4    Period  Weeks    Status  Achieved    Target Date  11/09/18      PT SHORT TERM GOAL #3   Title  Pt will perform sit to stand with min assist for improved independence with transfers in the home.    Time  4    Period  Weeks    Status  Not Met    Target Date  11/09/18      PT SHORT TERM GOAL #4   Title  Pt will perform standing activities with 1 UE support at sink, with supervision for at least 5 minutes, to ease caregiver  burden/improved participation in ADLs.    Baseline  2:30 at sink 11/12/2018; reports up to 4 minutes at home  Time  4    Period  Weeks    Status  Not Met    Target Date  11/09/18        PT Long Term Goals - 12/20/18 0909      PT LONG TERM GOAL #1   Title  Pt will perform progression of HEP for improved flexibility, strength, balance, with husband's assistance.  TARGET 12/07/2018    Time  8    Period  Weeks    Status  On-going      PT LONG TERM GOAL #2   Title  Pt will perform stand pivot transfer at supervision level for improved independence with transfers, decreased caregiver burden.    Time  8    Period  Weeks    Status  Not Met      PT LONG TERM GOAL #3   Title  Pt will perform sit to stand with supervision, for improved independence with transfers and decreased caregiver burden.    Baseline  min guard/min assist    Time  8    Period  Weeks    Status  Not Met      PT LONG TERM GOAL #4   Title  Pt will ambulate x 25 ft w/ UPWalker at min A level consistently in order to indicate increased independence with ADLs at home.     Time  8    Period  Weeks    Status  Partially Met            Plan - 12/20/18 1138    Clinical Impression Statement  Assessed LTGs today, with LTG 1 ongoing, as pt will benefit from continuing to progress HEP, LTG 2 and 3 not met for transfers, but pt is more consistently at min assist level of transfers.  LTG 4 partially met, as pt has surpassed distance for gait, but requires mod assist at times.  Pt is consistently improving transfers and has been able to walk with UPWalker in past 2 therapy sessions.  Pt will benefit from skilled PT to further address transfers, household gait, stair negotiation (for new townhome).  See renewal/updated goals for details.    Personal Factors and Comorbidities  Comorbidity 3+;Time since onset of injury/illness/exacerbation    Comorbidities  HTN, DM, Hyperlipidemia, CVA (01/2016, 03/2016), hx of UTI, cardiomyopathy,  CHF    Examination-Activity Limitations  Stand;Locomotion Level;Transfers    Examination-Participation Restrictions  Community Activity    Stability/Clinical Decision Making  Evolving/Moderate complexity    Rehab Potential  Fair    PT Frequency  1x / week    PT Duration  8 weeks   including eval day   PT Treatment/Interventions  ADLs/Self Care Home Management;Therapeutic exercise;Therapeutic activities;Functional mobility training;Gait training;DME Instruction;Balance training;Neuromuscular re-education;Patient/family education;Orthotic Fit/Training    PT Next Visit Plan  Transfer training, stair training, gait training for houshold distances; update HEP as able, use SciFit for lower extremity flexibility/strengthening    Consulted and Agree with Plan of Care  Patient;Family member/caregiver    Family Member Consulted  Husband       Patient will benefit from skilled therapeutic intervention in order to improve the following deficits and impairments:  Decreased mobility, Decreased strength, Decreased balance, Decreased endurance, Abnormal gait, Difficulty walking, Impaired tone  Visit Diagnosis: 1. Muscle weakness (generalized)   2. Other abnormalities of gait and mobility   3. Unsteadiness on feet        Problem List Patient Active Problem List   Diagnosis Date Noted  .  Cardiomyopathy ([place])   . Coronary artery disease involving native coronary artery of native heart without angina pectoris   . Ischemic cardiomyopathy 07/04/2017  . 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 Orthostatic hypotension 04/08/2016  . Muscle cramps 04/08/2016  . Right pontine cerebrovascular accident ([place]) 04/08/2016  . Spastic hemiparesis of left nondominant side ([place]) 04/08/2016  . Cerebral thrombosis with cerebral infarction 04/06/2016  . Basilar artery stenosis   . Dysarthria 04/05/2016  . History of CVA with residual deficit 04/05/2016  . Sinus tachycardia 04/05/2016  . Acute  encephalopathy 04/05/2016  . Chronic pain 04/05/2016  . Other hyperlipidemia 04/05/2016  . Ischemic stroke ([place])   . 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 TIA (transient ischemic attack) 11/07/2014  . Uncontrolled hypertension 11/07/2014  . Controlled type 2 diabetes mellitus with diabetic nephropathy ([place]) 11/07/2014    , W. 12/20/2018, 11:45 AM Frazier Butt., PT  [facility] [address] [place], [place], [address] Phone: [phone]   Fax:  [phone]  Name: MAKENZI BANNISTER MRN: [id number] Date of Birth: [birth year]  PT Short Term Goals - 12/20/18 1147      PT SHORT TERM GOAL #1   Title  Pt will verbalize understanding of sequence of transfers, to assist husband with transfer efficiency and safety.  TARGET 01/18/2019    Time  4    Period  Weeks    Status  New    Target Date  01/18/19      PT SHORT TERM GOAL #2   Title  Pt will perform stand pivot transfer with min guard level in order to increase independence at home.    Time  4    Period  Weeks    Status  Revised    Target Date  01/18/19      PT SHORT TERM GOAL #3   Title  Pt will perform sit to stand with min guard for improved independence with transfers in the home.    Time  4    Period  Weeks    Status  Revised    Target Date  01/18/19      PT SHORT TERM GOAL #4   Title  Pt will ambulate 25-50 ft using UPWalker, with husband's min assistance, at least 3 times per week.    Time  4    Period  Weeks    Status  New    Target Date  01/18/19      PT Long Term Goals - 12/20/18 1150      PT LONG TERM GOAL #1   Title  Pt will perform progression of HEP for improved flexibility, strength, balance, with husband's assistance.  TARGET 02/15/2019    Time  8    Period  Weeks    Status  On-going    Target Date  02/17/19      PT LONG TERM GOAL #2   Title  Pt will perform stand pivot transfer at supervision level for improved independence with transfers, decreased  caregiver burden.    Time  8    Period  Weeks    Status  On-going    Target Date  02/17/19      PT LONG TERM GOAL #3   Title  Pt will perform sit to stand with supervision, for improved independence with transfers and decreased caregiver burden.    Baseline  min guard/min assist    Time  8    Period  Weeks    Status  Revised    Target Date  02/17/19      PT LONG TERM GOAL #4   Title  Pt will ambulate x 50-75 ft w/ UPWalker at min A level with husband's assistance, at least 4-5 times per week at home as part of ongoing HEP.    Time  8    Period  Weeks    Status  Revised    Target Date  02/17/19      PT LONG TERM GOAL #5   Title  Pt/husband will verbalize plans for ongoing community fitness program after d/c from PT to maintain gains made in PT.    Time  8    Period  Weeks    Status  New    Target Date  02/17/19      Mady Haagensen, PT 12/20/18 11:53 AM Phone: [phone] Fax: [phone]

## 2018-12-20 NOTE — Therapy (Signed)
[facility] [address] [place], [place], [address] Phone: [phone]   Fax:  [phone]  Occupational Therapy Treatment  Patient Details  Name: Shelly SaucierJody H Cadavid MRN: [id number] Date of Birth: 1955/10/20 Referring Provider (OT): Dr. Claudette LawsAndrew Kirsteins   Encounter Date: 12/20/2018  OT End of Session - 12/20/18 0933    Visit Number  4    Number of Visits  9    Date for OT Re-Evaluation  12/15/18    Authorization Type  06/15/22-4/0/105/5/20-01/15/19 cert.  Medicare / BCBS    Authorization - Visit Number  4    Authorization - Number of Visits  10    OT Start Time  1000    OT Stop Time  1045    OT Time Calculation (min)  45 min    Activity Tolerance  Patient tolerated treatment well    Behavior During Therapy  Flat affect       Past Medical History:  Diagnosis Date  . Diabetes mellitus without complication ([facility])   . Hyperlipidemia   . Hypertension   . Stroke ([facility])   . UTI (urinary tract infection)     Past Surgical History:  Procedure Laterality Date  . BACK SURGERY    . BREAST SURGERY    . COLONOSCOPY WITH PROPOFOL N/A 12/24/2013   Procedure: COLONOSCOPY WITH PROPOFOL;  Surgeon: Charolett BumpersMartin K Johnson, MD;  Location: [facility];  Service: Endoscopy;  Laterality: N/A;  . RIGHT/LEFT HEART CATH AND CORONARY ANGIOGRAPHY N/A 08/10/2017   Procedure: RIGHT/LEFT HEART CATH AND CORONARY ANGIOGRAPHY;  Surgeon: Lennette BihariKelly, Thomas A, MD;  [facility];  Service: Cardiovascular;  Laterality: N/A;    There were no vitals filed for this visit.  Subjective Assessment - 12/20/18 0933    Subjective   Pt reports that her splint is was fittting well, but that it is packed so she hasn't been wearing last 2 weeks.  She is looking for it.    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  PMH:  HTN, DM, orthostatic hypotension, hx of adhesive  capsulitis L shoulder, HDL, cardiomyopathy, s/p Botox 08/14/18.      Limitations  fall risk    Patient Stated Goals  to be able to move L hand and squeeze stronger    Currently in Pain?  No/denies       CLINIC OPERATION CHANGES: Outpatient Neuro Rehab is open at lower capacity following universal masking, social distancing, and patient screening.  The patient's COVID risk of complications score is 5.      Transfer w/c>mat with mod A. (and mat>w/c with mod A)  In supine, PROM elbow ext, shoulder flex, shoulder ER, shoulder abduction, supination, finger ext, and wrist ext.  Supine>sitting with min A with cueing to roll to the L prior to sitting for normal movement patterns, incr ease, and decr assist.   In sitting, continue stretching with focus on neutral LUE positioning.   Recommended trial use of figure 8 soft strap with velcro to assist holding L hand on up walker prn.    OT Education - 12/20/18 1208    Education Details  Updates to HEP--see pt instructions    Person(s) Educated  Patient    Methods  Explanation;Demonstration;Verbal cues;Handout;Tactile cues    Comprehension  Verbalized understanding;Returned demonstration;Verbal cues required       OT Short Term Goals - 12/20/18 1209  OT SHORT TERM GOAL #1   Title  Pt will be independnt with updated splint wear/care for spasticity management and improved positioning.--check STGs 11/15/18    Time  4    Period  Weeks    Status  Deferred   12/13/18:  has not brought in current splint for assessment.  12/20/18:  pt reports that splint fits well, but that she hasn't been wearing last week or 2 due to being packed     OT SHORT TERM GOAL #2   Title  Pt/family to verbalize understanding with proper positioning of LUE during activity and in bed to decr risk of future complications including pain.    Time  4    Period  Weeks    Status  On-going      OT SHORT TERM GOAL #3   Title  ----      OT SHORT TERM GOAL #4   Title  ----       OT SHORT TERM GOAL #5   Title  ----        OT Long Term Goals - 12/13/18 1008      OT LONG TERM GOAL #1   Title  Pt/ caregiver will be independent with updated HEP--check LTGs 12/15/18    Time  8    Period  Weeks    Status  New      OT LONG TERM GOAL #2   Title  Pt will demo improved PROM LUE to be able to position LUE appropriately on up walker in prep for short distance ambulation.    Time  8    Period  Weeks    Status  Achieved   12/13/18:  per PT/pt     OT LONG TERM GOAL #3   Title  Pt will demo at least 80* PROM L shoulder flex without pain for incr ease/safety with hygiene and UB dressing.    Time  8    Period  Weeks    Status  Achieved   12/13/18:  80-90* in supine     OT LONG TERM GOAL #4   Title  Pt will be able to use LUE as stabilizer for selected ADL/IADL tasks.    Time  8    Period  Weeks    Status  Achieved   12/13/18     OT LONG TERM GOAL #5   Title  ---            Plan - 12/20/18 0935    Clinical Impression Statement  Pt is progressing towards goals with improved LUE ROM, functional use, and positioning.    OT Occupational Profile and History  Detailed Assessment- Review of Records and additional review of physical, cognitive, psychosocial history related to current functional performance    Occupational performance deficits (Please refer to evaluation for details):  ADL's;IADL's;Leisure;Social Participation    Body Structure / Function / Physical Skills  ADL;GMC;UE functional use;Coordination;IADL;Pain;Strength;Improper spinal/pelvic alignment;Body mechanics;Decreased knowledge of use of DME;Flexibility;Mobility;ROM;Tone    Rehab Potential  Fair    Clinical Decision Making  Several treatment options, min-mod task modification necessary    Comorbidities Affecting Occupational Performance:  Presence of comorbidities impacting occupational performance    Comorbidities impacting occupational performance description:  hx of adhesive capsulitis L  shoulder, orthostatic hypotension, cardiomyopathy    Modification or Assistance to Complete Evaluation   Min-Moderate modification of tasks or assist with assess necessary to complete eval    OT Frequency  1x / week  OT Duration  8 weeks   +eval over 12 weeks    OT Treatment/Interventions  Self-care/ADL training;Therapeutic exercise;Patient/family education;Splinting;Neuromuscular education;Moist Heat;Fluidtherapy;Functional Mobility Training;Energy conservation;Therapeutic activities;Cryotherapy;Ultrasound;Contrast Bath;DME and/or AE instruction;Manual Therapy;Passive range of motion    Plan  check remaining goals, ?d/c vs 1 additional visit    OT Home Exercise Plan  Education provided:  initial HEP issued 10/16/18    Recommended Other Services  current with PT     Consulted and Agree with Plan of Care  Patient;Family member/caregiver    Family Member Consulted  husband       Patient will benefit from skilled therapeutic intervention in order to improve the following deficits and impairments:   Body Structure / Function / Physical Skills: ADL, GMC, UE functional use, Coordination, IADL, Pain, Strength, Improper spinal/pelvic alignment, Body mechanics, Decreased knowledge of use of DME, Flexibility, Mobility, ROM, Tone       Visit Diagnosis: 1. Hemiplegia and hemiparesis following cerebral infarction affecting left non-dominant side (HCC)   2. Other symptoms and signs involving the musculoskeletal system   3. Stiffness of left shoulder, not elsewhere classified   4. Abnormal posture   5. Stiffness of left hand, not elsewhere classified   6. Stiffness of left wrist, not elsewhere classified   7. Other abnormalities of gait and mobility   8. Unsteadiness on feet       Problem List Patient Active Problem List   Diagnosis Date Noted  . Cardiomyopathy ([place])   . Coronary artery disease involving native coronary artery of native heart without angina pectoris   . Ischemic cardiomyopathy  07/04/2017  . 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 Orthostatic hypotension 04/08/2016  . Muscle cramps 04/08/2016  . Right pontine cerebrovascular accident ([place]) 04/08/2016  . Spastic hemiparesis of left nondominant side ([place]) 04/08/2016  . Cerebral thrombosis with cerebral infarction 04/06/2016  . Basilar artery stenosis   . Dysarthria 04/05/2016  . History of CVA with residual deficit 04/05/2016  . Sinus tachycardia 04/05/2016  . Acute encephalopathy 04/05/2016  . Chronic pain 04/05/2016  . Other hyperlipidemia 04/05/2016  . Ischemic stroke ([place])   . 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 TIA (transient ischemic attack) 11/07/2014  . Uncontrolled hypertension 11/07/2014  . Controlled type 2 diabetes mellitus with diabetic nephropathy ([place]) 11/07/2014    [facility] 12/20/2018, 12:13 PM  [facility] [address] [place], [place], [address] Phone: [phone]   Fax:  [phone]  Name: KEIYANA STEHR MRN: [id number] Date of Birth: 10-16-1955   Vianne Bulls, OTR/L [facility] [address] [place], [place]  [address] [phone] phone [phone] 12/20/18 12:13 PM

## 2018-12-20 NOTE — Patient Instructions (Signed)
   Sit and hold in both hands, then push noodle out along legs.   - Slowly raise noodle.  Repeat 10-15 times,   - Then, bring side to side.  Repeat 10-15 times 3x/day.       Try to pick up small bottles and then release.

## 2018-12-24 ENCOUNTER — Encounter: Payer: Self-pay | Admitting: Occupational Therapy

## 2018-12-24 ENCOUNTER — Other Ambulatory Visit: Payer: Self-pay

## 2018-12-24 ENCOUNTER — Ambulatory Visit: Payer: Medicare Other | Admitting: Occupational Therapy

## 2018-12-24 DIAGNOSIS — M25632 Stiffness of left wrist, not elsewhere classified: Secondary | ICD-10-CM

## 2018-12-24 DIAGNOSIS — R2689 Other abnormalities of gait and mobility: Secondary | ICD-10-CM

## 2018-12-24 DIAGNOSIS — M25612 Stiffness of left shoulder, not elsewhere classified: Secondary | ICD-10-CM

## 2018-12-24 DIAGNOSIS — R29898 Other symptoms and signs involving the musculoskeletal system: Secondary | ICD-10-CM

## 2018-12-24 DIAGNOSIS — M25642 Stiffness of left hand, not elsewhere classified: Secondary | ICD-10-CM

## 2018-12-24 DIAGNOSIS — I69354 Hemiplegia and hemiparesis following cerebral infarction affecting left non-dominant side: Secondary | ICD-10-CM | POA: Diagnosis not present

## 2018-12-24 DIAGNOSIS — M6281 Muscle weakness (generalized): Secondary | ICD-10-CM

## 2018-12-24 DIAGNOSIS — R293 Abnormal posture: Secondary | ICD-10-CM

## 2018-12-24 NOTE — Therapy (Signed)
[facility] [address] [place], [place], [address] Phone: [phone]   Fax:  [phone]  Occupational Therapy Treatment  Patient Details  Name: Shelly Mcdaniel MRN: [id number] Date of Birth: 1956/04/21 Referring Provider (OT): Dr. Claudette LawsAndrew Kirsteins   Encounter Date: 12/24/2018  OT End of Session - 12/24/18 1405    Visit Number  5    Number of Visits  10    Date for OT Re-Evaluation  01/23/19    Authorization Type  01/05/35-64/40/347/13/20-10/11/20 cert.  Medicare / BCBS    Authorization - Visit Number  5    Authorization - Number of Visits  10    OT Start Time  1305    OT Stop Time  1350    OT Time Calculation (min)  45 min    Activity Tolerance  Patient tolerated treatment well    Behavior During Therapy  WFL for tasks assessed/performed       Past Medical History:  Diagnosis Date  . Diabetes mellitus without complication (HCC)   . Hyperlipidemia   . Hypertension   . Stroke (HCC)   . UTI (urinary tract infection)     Past Surgical History:  Procedure Laterality Date  . BACK SURGERY    . BREAST SURGERY    . COLONOSCOPY WITH PROPOFOL N/A 12/24/2013   Procedure: COLONOSCOPY WITH PROPOFOL;  Surgeon: Charolett BumpersMartin K Johnson, MD;  Location: [facility];  Service: Endoscopy;  Laterality: N/A;  . RIGHT/LEFT HEART CATH AND CORONARY ANGIOGRAPHY N/A 08/10/2017   Procedure: RIGHT/LEFT HEART CATH AND CORONARY ANGIOGRAPHY;  Surgeon: Lennette BihariKelly, Thomas A, MD;  [facility]: [facility];  Service: Cardiovascular;  Laterality: N/A;    There were no vitals filed for this visit.  Subjective Assessment - 12/24/18 1402    Subjective   I feel like I have made so much progress with you.  Can we continue?  I'd like to be able to use my arm more for cooking, bathing, etc.    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  PMH:  HTN, DM, orthostatic  hypotension, hx of adhesive capsulitis L shoulder, HDL, cardiomyopathy, s/p Botox 08/14/18.      Limitations  fall risk    Patient Stated Goals  to be able to move L hand and squeeze stronger    Currently in Pain?  No/denies         CLINIC OPERATION CHANGES: Outpatient Neuro Rehab is open at lower capacity following universal masking, social distancing, and patient screening.  The patient's COVID risk of complications score is 5.      Functional transfers:  Transfer w/c>mat with min-mod A/cues. (and mat>w/c with min A-mod A).  Supine> sitting with min A/cues.  Scooting back in w/c with min A/cueing  In supine, PROM elbow ext, shoulder flex, shoulder ER, shoulder abduction, supination, finger ext, and wrist ext.   In sitting, continue stretching with focus on neutral LUE positioning.   In sitting, attempted low-range assisted reach (min-mod A) to grasp/release bottle (utilizing active relaxation) with min cueing for timing.    Checked remaining goals and discussed progress.  Pt desires to continue with OT and was able to identify specific goals/functional activities that she wants to work on with OT.  Therefore, will renew with updated goals.           OT Education - 12/24/18 1403  Education Details  AE to assist in the kitchen (chopper, 1-handed peeler, adaptive cutting board, use of shelf liner), recommended trying to use bath poof/sponge in L hand and wrapping frequently used bottles in coban to make it easier to hold in L hand.    Person(s) Educated  Patient    Methods  Explanation;Demonstration    Comprehension  Verbalized understanding       OT Short Term Goals - 12/24/18 1410      OT SHORT TERM GOAL #1   Title  Pt will be independnt with updated splint wear/care for spasticity management and improved positioning.--check STGs 11/15/18    Time  4    Period  Weeks    Status  Deferred   12/13/18:  has not brought in current splint for assessment.  12/20/18:  pt reports that  splint fits well, but that she hasn't been wearing last week or 2 due to being packed     OT SHORT TERM GOAL #2   Title  Pt/family to verbalize understanding with proper positioning of LUE during activity and in bed to decr risk of future complications including pain.    Time  4    Period  Weeks    Status  Achieved      OT SHORT TERM GOAL #3   Title  ----      OT SHORT TERM GOAL #4   Title  ----      OT SHORT TERM GOAL #5   Title  ----        OT Long Term Goals - 12/24/18 1410      OT LONG TERM GOAL #1   Title  Pt/ caregiver will be independent with updated HEP--check LTGs 12/15/18    Time  8    Period  Weeks    Status  Achieved   12/24/18     OT LONG TERM GOAL #2   Title  Pt will demo improved PROM LUE to be able to position LUE appropriately on up walker in prep for short distance ambulation.    Time  8    Period  Weeks    Status  Achieved   12/13/18:  per PT/pt     OT LONG TERM GOAL #3   Title  Pt will demo at least 80* PROM L shoulder flex without pain for incr ease/safety with hygiene and UB dressing.    Time  8    Period  Weeks    Status  Achieved   12/13/18:  80-90* in supine     OT LONG TERM GOAL #4   Title  Pt will be able to use LUE as stabilizer for selected ADL/IADL tasks.    Time  8    Period  Weeks    Status  Achieved   12/13/18     OT LONG TERM GOAL #5   Title  Pt will be able to use LUE to apply deodorant under RUE mod I.--check updated goals 01/24/19    Time  5    Period  Weeks    Status  New      Long Term Additional Goals   Additional Long Term Goals  Yes      OT LONG TERM GOAL #6   Title  Pt will be able to use LUE to assist with bathing/washing RUE and tops of legs/stomach.    Time  5    Period  Weeks    Status  New  OT LONG TERM GOAL #7   Title  Pt will be able to hold/stabilize mixing bowl with LUE while stirring with RUE for incr independence for kitchen tasks.    Time  5    Period  Weeks    Status  New      OT LONG TERM GOAL #8    Title  Pt will demo at least 20* active elbow extension to assist with functional reach/in order to open low drawer/cabinet.    Time  5    Period  Weeks    Status  New            Plan - 12/24/18 1407    Clinical Impression Statement  Pt has made good progress with improved LUE ROM, functional use, and positioning.  Pt would benefit from additional occupational therapy to maximize LUE functional use and incr independence.    OT Occupational Profile and History  Detailed Assessment- Review of Records and additional review of physical, cognitive, psychosocial history related to current functional performance    Occupational performance deficits (Please refer to evaluation for details):  ADL's;IADL's;Leisure;Social Participation    Body Structure / Function / Physical Skills  ADL;GMC;UE functional use;Coordination;IADL;Pain;Strength;Improper spinal/pelvic alignment;Body mechanics;Decreased knowledge of use of DME;Flexibility;Mobility;ROM;Tone;Dexterity    Rehab Potential  Fair    Clinical Decision Making  Several treatment options, min-mod task modification necessary    Comorbidities Affecting Occupational Performance:  Presence of comorbidities impacting occupational performance    Comorbidities impacting occupational performance description:  hx of adhesive capsulitis L shoulder, orthostatic hypotension, cardiomyopathy    Modification or Assistance to Complete Evaluation   Min-Moderate modification of tasks or assist with assess necessary to complete eval    OT Frequency  1x / week    OT Duration  --   5 weeks   OT Treatment/Interventions  Self-care/ADL training;Therapeutic exercise;Patient/family education;Splinting;Neuromuscular education;Moist Heat;Fluidtherapy;Functional Mobility Training;Energy conservation;Therapeutic activities;Cryotherapy;Ultrasound;Contrast Bath;DME and/or AE instruction;Manual Therapy;Passive range of motion;Electrical Stimulation;Paraffin    Plan  renewal  completed 12/24/18 to work on specific functional use of LUE as requested by pt    OT Home Exercise Plan  Education provided:  initial HEP issued 10/16/18    Recommended Other Services  current with PT     Consulted and Agree with Plan of Care  Patient;Family member/caregiver    Family Member Consulted  husband       Patient will benefit from skilled therapeutic intervention in order to improve the following deficits and impairments:   Body Structure / Function / Physical Skills: ADL, GMC, UE functional use, Coordination, IADL, Pain, Strength, Improper spinal/pelvic alignment, Body mechanics, Decreased knowledge of use of DME, Flexibility, Mobility, ROM, Tone, Dexterity       Visit Diagnosis: 1. Hemiplegia and hemiparesis following cerebral infarction affecting left non-dominant side (HCC)   2. Stiffness of left wrist, not elsewhere classified   3. Stiffness of left hand, not elsewhere classified   4. Abnormal posture   5. Stiffness of left shoulder, not elsewhere classified   6. Other symptoms and signs involving the musculoskeletal system   7. Other abnormalities of gait and mobility   8. Muscle weakness (generalized)       Problem List Patient Active Problem List   Diagnosis Date Noted  . Cardiomyopathy ([place])   . Coronary artery disease involving native coronary artery of native heart without angina pectoris   . Ischemic cardiomyopathy 07/04/2017  . 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 Orthostatic hypotension 04/08/2016  . Muscle cramps 04/08/2016  .  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 Cerebral thrombosis with cerebral infarction 04/06/2016  . Basilar artery stenosis   . Dysarthria 04/05/2016  . History of CVA with residual deficit 04/05/2016  . Sinus tachycardia 04/05/2016  . Acute encephalopathy 04/05/2016  . Chronic pain 04/05/2016  . Other hyperlipidemia 04/05/2016   . Ischemic stroke (HCC)   . 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 TIA (transient ischemic attack) 11/07/2014  . Uncontrolled hypertension 11/07/2014  . Controlled type 2 diabetes mellitus with diabetic nephropathy (HCC) 11/07/2014    [facility] Inc[facility]REEMAN,Ardenia Stiner 12/24/2018, 2:23 PM  [facility] [address] [place], [place], [address] Phone: [phone]   Fax:  [phone]  Name: Shelly Mcdaniel MRN: [id number] Date of Birth: 1956-01-18   Willa FraterAngela Tessia Kassin, OTR/L [facility] [address] [place], [place]  [address] [phone] phone [phone] 12/24/18 2:23 PM

## 2018-12-25 ENCOUNTER — Encounter: Payer: Medicare Other | Attending: Physical Medicine & Rehabilitation | Admitting: Physical Medicine & Rehabilitation

## 2018-12-25 DIAGNOSIS — G8110 Spastic hemiplegia affecting unspecified side: Secondary | ICD-10-CM | POA: Insufficient documentation

## 2018-12-28 ENCOUNTER — Ambulatory Visit: Payer: Medicare Other | Admitting: Physical Therapy

## 2018-12-28 ENCOUNTER — Encounter: Payer: Medicare Other | Admitting: Physical Medicine & Rehabilitation

## 2019-01-03 ENCOUNTER — Ambulatory Visit: Payer: Medicare Other | Admitting: Physical Therapy

## 2019-01-03 ENCOUNTER — Encounter: Payer: Self-pay | Admitting: Physical Therapy

## 2019-01-03 ENCOUNTER — Other Ambulatory Visit: Payer: Self-pay

## 2019-01-03 DIAGNOSIS — I69354 Hemiplegia and hemiparesis following cerebral infarction affecting left non-dominant side: Secondary | ICD-10-CM | POA: Diagnosis not present

## 2019-01-03 DIAGNOSIS — R2689 Other abnormalities of gait and mobility: Secondary | ICD-10-CM

## 2019-01-03 DIAGNOSIS — R2681 Unsteadiness on feet: Secondary | ICD-10-CM

## 2019-01-03 DIAGNOSIS — M6281 Muscle weakness (generalized): Secondary | ICD-10-CM

## 2019-01-03 NOTE — Therapy (Signed)
[place] [address] [place], [place], [address] Phone: [phone]   Fax:  [phone]  Physical Therapy Treatment  Patient Details  Name: Shelly Mcdaniel MRN: [id number] Date of Birth: 13-Apr-1956 Referring Provider (PT): Kirsteins CLINIC OPERATION CHANGES: Outpatient Neuro Rehab is open at lower capacity following universal masking, social distancing, and patient screening.  The patient's COVID risk of complications score is 5*.   Encounter Date: 01/03/2019  PT End of Session - 01/03/19 1307    Visit Number  9    Number of Visits  16    Date for PT Re-Evaluation  03/20/19   per renewal 12/20/2018   Authorization Type  Medicare/BCBS    Authorization Time Period  12/20/2018-03/20/2019    PT Start Time  1209    PT Stop Time  1253    PT Time Calculation (min)  44 min    Equipment Utilized During Treatment  Gait belt    Activity Tolerance  Patient tolerated treatment well    Behavior During Therapy  WFL for tasks assessed/performed       Past Medical History:  Diagnosis Date  . Diabetes mellitus without complication ([place])   . Hyperlipidemia   . Hypertension   . Stroke ([place])   . UTI (urinary tract infection)     Past Surgical History:  Procedure Laterality Date  . BACK SURGERY    . BREAST SURGERY    . COLONOSCOPY WITH PROPOFOL N/A 12/24/2013   Procedure: COLONOSCOPY WITH PROPOFOL;  Surgeon: [name], MD;  Location: [facility];  Service: Endoscopy;  Laterality: N/A;  . RIGHT/LEFT HEART CATH AND CORONARY ANGIOGRAPHY N/A 08/10/2017   Procedure: RIGHT/LEFT HEART CATH AND CORONARY ANGIOGRAPHY;  Surgeon: Troy Sine, MD;  Location: [place] [facility];  Service: Cardiovascular;  Laterality: N/A;    There were no vitals filed for this visit.  Subjective Assessment - 01/03/19 1213    Subjective  Had to cancel last week due to not feeling well.  Hasnt been sleeping well.  She thinks drop in blood sugar  impacted it.    Patient is accompained by:  Family member   Husband   Pertinent History  HTN, DM, Hyperlipidemia, CVA (01/2016, 03/2016), hx of UTI, cardiomyopathy, CHF    Limitations  Standing;Walking    Patient Stated Goals  Pt's goals for physical therapy are to improve transfers, building strength back in legs.    Currently in Pain?  No/denies            [facility] Adult PT Treatment/Exercise - 01/03/19 0001      Transfers   Transfers  Sit to Stand;Stand to Sit;Stand Pivot Transfers    Sit to Stand  With upper extremity assist;With armrests;From bed;From chair/3-in-1;4: Min assist    Sit to Stand Details  Verbal cues for technique;Verbal cues for sequencing;Manual facilitation for weight shifting    Stand to Sit  4: Min assist    Stand to Sit Details (indicate cue type and reason)  Verbal cues for sequencing;Verbal cues for technique    Stand Pivot Transfers  3: Mod assist    Stand Pivot Transfer Details (indicate cue type and reason)  standing pivot to w/c after stair instlruction    Number of Reps  Other reps (comment)   5     Ambulation/Gait   Stairs  Yes    Stairs Assistance  3: Mod assist    Stairs Assistance Details (indicate cue type and reason)  assist to  bring LLE off step going down and assist to maintain upright posture.  Increased hip and knee flexion with fatigue    Stair Management Technique  One rail Right;Step to pattern;Forwards    Number of Stairs  4    Height of Stairs  6    Pre-Gait Activities  Standing at counter with 1 Ue support x 90 seconds x 2 reps with min assist and cues for knee/hip/trunk extension      Exercises   Exercises  Knee/Hip      Knee/Hip Exercises: Aerobic   Stepper  Scifit level 1.2 bil LE's x 10 minutes for flexibility.  Cues to keep LLE positioned properly.         PT Short Term Goals - 12/20/18 1147      PT SHORT TERM GOAL #1   Title  Pt will verbalize understanding of sequence of transfers, to assist husband with transfer  efficiency and safety.  TARGET 01/18/2019    Time  4    Period  Weeks    Status  New    Target Date  01/18/19      PT SHORT TERM GOAL #2   Title  Pt will perform stand pivot transfer with min guard level in order to increase independence at home.    Time  4    Period  Weeks    Status  Revised    Target Date  01/18/19      PT SHORT TERM GOAL #3   Title  Pt will perform sit to stand with min guard for improved independence with transfers in the home.    Time  4    Period  Weeks    Status  Revised    Target Date  01/18/19      PT SHORT TERM GOAL #4   Title  Pt will ambulate 25-50 ft using UPWalker, with husband's min assistance, at least 3 times per week.    Time  4    Period  Weeks    Status  New    Target Date  01/18/19        PT Long Term Goals - 12/20/18 1150      PT LONG TERM GOAL #1   Title  Pt will perform progression of HEP for improved flexibility, strength, balance, with husband's assistance.  TARGET 02/15/2019    Time  8    Period  Weeks    Status  On-going    Target Date  02/17/19      PT LONG TERM GOAL #2   Title  Pt will perform stand pivot transfer at supervision level for improved independence with transfers, decreased caregiver burden.    Time  8    Period  Weeks    Status  On-going    Target Date  02/17/19      PT LONG TERM GOAL #3   Title  Pt will perform sit to stand with supervision, for improved independence with transfers and decreased caregiver burden.    Baseline  min guard/min assist    Time  8    Period  Weeks    Status  Revised    Target Date  02/17/19      PT LONG TERM GOAL #4   Title  Pt will ambulate x 50-75 ft w/ UPWalker at min A level with husband's assistance, at least 4-5 times per week at home as part of ongoing HEP.    Time  8    Period  Weeks    Status  Revised    Target Date  02/17/19      PT LONG TERM GOAL #5   Title  Pt/husband will verbalize plans for ongoing community fitness program after d/c from PT to maintain gains  made in PT.    Time  8    Period  Weeks    Status  New    Target Date  02/17/19            Plan - 01/03/19 1307    Clinical Impression Statement  Pt continues to report not feeling well overall with decreased activity at home.  Has called MD and he feels like it is related to per blood sugars per pt.  Fatigues during session today and needs rest breaks between all activities.  Continue PT per POC.    Personal Factors and Comorbidities  Comorbidity 3+;Time since onset of injury/illness/exacerbation    Comorbidities  HTN, DM, Hyperlipidemia, CVA (01/2016, 03/2016), hx of UTI, cardiomyopathy, CHF    Examination-Activity Limitations  Stand;Locomotion Level;Transfers    Examination-Participation Restrictions  Community Activity    Stability/Clinical Decision Making  Evolving/Moderate complexity    Rehab Potential  Fair    PT Frequency  1x / week    PT Duration  8 weeks   per renewal 12/20/2018   PT Treatment/Interventions  ADLs/Self Care Home Management;Therapeutic exercise;Therapeutic activities;Functional mobility training;Gait training;DME Instruction;Balance training;Neuromuscular re-education;Patient/family education;Orthotic Fit/Training    PT Next Visit Plan  Transfer training, stair training, gait training for houshold distances; update HEP as able, use SciFit for lower extremity flexibility/strengthening    Consulted and Agree with Plan of Care  Patient       Patient will benefit from skilled therapeutic intervention in order to improve the following deficits and impairments:  Decreased mobility, Decreased strength, Decreased balance, Decreased endurance, Abnormal gait, Difficulty walking, Impaired tone  Visit Diagnosis: 1. Muscle weakness (generalized)   2. Other abnormalities of gait and mobility   3. Unsteadiness on feet        Problem List Patient Active Problem List   Diagnosis Date Noted  . Cardiomyopathy (HCC)   . Coronary artery disease involving native coronary  artery of native heart without angina pectoris   . Ischemic cardiomyopathy 07/04/2017  . 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 Orthostatic hypotension 04/08/2016  . Muscle cramps 04/08/2016  . 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 Cerebral thrombosis with cerebral infarction 04/06/2016  . Basilar artery stenosis   . Dysarthria 04/05/2016  . History of CVA with residual deficit 04/05/2016  . Sinus tachycardia 04/05/2016  . Acute encephalopathy 04/05/2016  . Chronic pain 04/05/2016  . Other hyperlipidemia 04/05/2016  . Ischemic stroke (HCC)   . 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 TIA (transient ischemic attack) 11/07/2014  . Uncontrolled hypertension 11/07/2014  . Controlled type 2 diabetes mellitus with diabetic nephropathy (HCC) 11/07/2014    Newell Coralenise Terry Raney Antwine, PTA [facility] 01/03/19 1:10 PM Phone: [phone] Fax: [phone]   [facility] [address] [place], [place], [address] Phone: [phone]   Fax:  [phone]  Name: Shelly Mcdaniel MRN: [id number] Date of Birth: 1955-07-08

## 2019-01-14 ENCOUNTER — Other Ambulatory Visit: Payer: Self-pay | Admitting: Cardiovascular Disease

## 2019-01-16 ENCOUNTER — Telehealth: Payer: Self-pay | Admitting: Physical Therapy

## 2019-01-16 ENCOUNTER — Ambulatory Visit: Payer: Medicare Other | Attending: Physical Medicine & Rehabilitation | Admitting: Physical Therapy

## 2019-01-16 DIAGNOSIS — M25612 Stiffness of left shoulder, not elsewhere classified: Secondary | ICD-10-CM | POA: Insufficient documentation

## 2019-01-16 DIAGNOSIS — R293 Abnormal posture: Secondary | ICD-10-CM | POA: Insufficient documentation

## 2019-01-16 DIAGNOSIS — R2681 Unsteadiness on feet: Secondary | ICD-10-CM | POA: Insufficient documentation

## 2019-01-16 DIAGNOSIS — M25632 Stiffness of left wrist, not elsewhere classified: Secondary | ICD-10-CM | POA: Insufficient documentation

## 2019-01-16 DIAGNOSIS — R29898 Other symptoms and signs involving the musculoskeletal system: Secondary | ICD-10-CM | POA: Insufficient documentation

## 2019-01-16 DIAGNOSIS — M25642 Stiffness of left hand, not elsewhere classified: Secondary | ICD-10-CM | POA: Insufficient documentation

## 2019-01-16 DIAGNOSIS — I69354 Hemiplegia and hemiparesis following cerebral infarction affecting left non-dominant side: Secondary | ICD-10-CM | POA: Insufficient documentation

## 2019-01-16 DIAGNOSIS — M6281 Muscle weakness (generalized): Secondary | ICD-10-CM | POA: Insufficient documentation

## 2019-01-16 DIAGNOSIS — R2689 Other abnormalities of gait and mobility: Secondary | ICD-10-CM | POA: Insufficient documentation

## 2019-01-16 NOTE — Telephone Encounter (Signed)
Called and spoke with patient and her husband, as pt missed 9:30 PT appointment today.  Both patient and husband apologize that they forgot appointment, due to movers at their home today.  PT reminded pt of next appointments on 01/22/2019 at 9:30 and 11:00.  Mady Haagensen, PT 01/16/19 9:52 AM Phone: [phone] Fax: [phone]

## 2019-01-22 ENCOUNTER — Ambulatory Visit: Payer: Medicare Other | Admitting: Occupational Therapy

## 2019-01-22 ENCOUNTER — Encounter: Payer: Self-pay | Admitting: Occupational Therapy

## 2019-01-22 ENCOUNTER — Other Ambulatory Visit: Payer: Self-pay

## 2019-01-22 ENCOUNTER — Ambulatory Visit: Payer: Medicare Other | Admitting: Physical Therapy

## 2019-01-22 VITALS — BP 136/82 | HR 70

## 2019-01-22 DIAGNOSIS — R29898 Other symptoms and signs involving the musculoskeletal system: Secondary | ICD-10-CM

## 2019-01-22 DIAGNOSIS — R2689 Other abnormalities of gait and mobility: Secondary | ICD-10-CM | POA: Diagnosis present

## 2019-01-22 DIAGNOSIS — M6281 Muscle weakness (generalized): Secondary | ICD-10-CM | POA: Diagnosis present

## 2019-01-22 DIAGNOSIS — M25642 Stiffness of left hand, not elsewhere classified: Secondary | ICD-10-CM

## 2019-01-22 DIAGNOSIS — R293 Abnormal posture: Secondary | ICD-10-CM

## 2019-01-22 DIAGNOSIS — R2681 Unsteadiness on feet: Secondary | ICD-10-CM

## 2019-01-22 DIAGNOSIS — M25612 Stiffness of left shoulder, not elsewhere classified: Secondary | ICD-10-CM

## 2019-01-22 DIAGNOSIS — M25632 Stiffness of left wrist, not elsewhere classified: Secondary | ICD-10-CM

## 2019-01-22 DIAGNOSIS — I69354 Hemiplegia and hemiparesis following cerebral infarction affecting left non-dominant side: Secondary | ICD-10-CM

## 2019-01-22 NOTE — Therapy (Signed)
[facility] [address] [place], [place], [address] Phone: [phone]   Fax:  [phone]  Physical Therapy Treatment  Patient Details  Name: Shelly Mcdaniel MRN: [id number] Date of Birth: [birth year] Referring Provider (PT): Kirsteins   Encounter Date: 01/22/2019  PT End of Session - 01/22/19 1030    Visit Number  10    Number of Visits  16    Date for PT Re-Evaluation  03/20/19   per renewal 12/20/2018   Authorization Type  Medicare/BCBS    Authorization Time Period  12/20/2018-03/20/2019    PT Start Time  0935    PT Stop Time  1016    PT Time Calculation (min)  41 min    Equipment Utilized During Treatment  Gait belt    Activity Tolerance  Patient tolerated treatment well    Behavior During Therapy  [facility] for tasks assessed/performed       Past Medical History:  Diagnosis Date  . Diabetes mellitus without complication (HCC)   . Hyperlipidemia   . Hypertension   . Stroke (HCC)   . UTI (urinary tract infection)     Past Surgical History:  Procedure Laterality Date  . BACK SURGERY    . BREAST SURGERY    . COLONOSCOPY WITH PROPOFOL N/A 12/24/2013   Procedure: COLONOSCOPY WITH PROPOFOL;  Surgeon: Charolett BumpersMartin K Johnson, MD;  Location: [facility];  Service: Endoscopy;  Laterality: N/A;  . RIGHT/LEFT HEART CATH AND CORONARY ANGIOGRAPHY N/A 08/10/2017   Procedure: RIGHT/LEFT HEART CATH AND CORONARY ANGIOGRAPHY;  Surgeon: Lennette BihariKelly, Thomas A, MD;  [facility];  Service: Cardiovascular;  Laterality: N/A;    Vitals:   01/22/19 1003 01/22/19 1006  BP: (!) 159/82 136/82  Pulse: 76 70       [facility] Adult PT Treatment/Exercise - 01/22/19 0001      Transfers   Transfers  Sit to Stand;Stand to Sit;Stand Pivot Transfers    Sit to Stand  With upper extremity assist;From chair/3-in-1;Other (comment);3: Mod assist   at // bars   Sit to Stand Details  Manual facilitation for weight shifting;Verbal cues for  technique;Verbal cues for precautions/safety    Stand to Sit  4: Min assist;With upper extremity assist;To chair/3-in-1;Other (comment)   in // bars   Stand to Sit Details (indicate cue type and reason)  Verbal cues for sequencing;Verbal cues for precautions/safety    Comments  Stood in // bars x 2 reps x 4 minutes each with UE support on R side.  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  Pt with difficulty with R knee extension.  Pt reports some knee discomfort after doing stairs over the weekend at home.      Knee/Hip Exercises: Stretches   Passive Hamstring Stretch  Right;1 rep;60 seconds   R heel resting on 4" step     Knee/Hip Exercises: Aerobic   Stepper  Scifit level 1.5 x 8 minutes for flexibility with bil LE's only and from w/c with PTA holding w/c in place               PT Short Term Goals - 12/20/18 1147      PT SHORT TERM GOAL #1   Title  Pt will verbalize understanding of sequence of transfers, to assist husband with transfer efficiency and safety.  TARGET 01/18/2019    Time  4    Period  Weeks    Status  New    Target Date  01/18/19  PT SHORT TERM GOAL #2   Title  Pt will perform stand pivot transfer with min guard level in order to increase independence at home.    Time  4    Period  Weeks    Status  Revised    Target Date  01/18/19      PT SHORT TERM GOAL #3   Title  Pt will perform sit to stand with min guard for improved independence with transfers in the home.    Time  4    Period  Weeks    Status  Revised    Target Date  01/18/19      PT SHORT TERM GOAL #4   Title  Pt will ambulate 25-50 ft using UPWalker, with husband's min assistance, at least 3 times per week.    Time  4    Period  Weeks    Status  New    Target Date  01/18/19        PT Long Term Goals - 12/20/18 1150      PT LONG TERM GOAL #1   Title  Pt will perform progression of HEP for improved flexibility, strength, balance, with husband's assistance.  TARGET 02/15/2019     Time  8    Period  Weeks    Status  On-going    Target Date  02/17/19      PT LONG TERM GOAL #2   Title  Pt will perform stand pivot transfer at supervision level for improved independence with transfers, decreased caregiver burden.    Time  8    Period  Weeks    Status  On-going    Target Date  02/17/19      PT LONG TERM GOAL #3   Title  Pt will perform sit to stand with supervision, for improved independence with transfers and decreased caregiver burden.    Baseline  min guard/min assist    Time  8    Period  Weeks    Status  Revised    Target Date  02/17/19      PT LONG TERM GOAL #4   Title  Pt will ambulate x 50-75 ft w/ UPWalker at min A level with husband's assistance, at least 4-5 times per week at home as part of ongoing HEP.    Time  8    Period  Weeks    Status  Revised    Target Date  02/17/19      PT LONG TERM GOAL #5   Title  Pt/husband will verbalize plans for ongoing community fitness program after d/c from PT to maintain gains made in PT.    Time  8    Period  Weeks    Status  New    Target Date  02/17/19            Plan - 01/22/19 1031    Clinical Impression Statement  Pt having some dizziness upon second standing attempt.  BP's checked. Pt reports dizziness resolved upon sitting.  Pt feels like it was her blood sugar.  Pt continues to have limited mobility and decreased endurance for activity.  Needs rest breaks during session.  Continue PT per POC.    Personal Factors and Comorbidities  Comorbidity 3+;Time since onset of injury/illness/exacerbation    Comorbidities  HTN, DM, Hyperlipidemia, CVA (01/2016, 03/2016), hx of UTI, cardiomyopathy, CHF    Examination-Activity Limitations  Stand;Locomotion Level;Transfers    Examination-Participation Restrictions  Community Activity    Stability/Clinical  Decision Making  Evolving/Moderate complexity    Rehab Potential  Fair    PT Frequency  1x / week    PT Duration  8 weeks   per renewal 12/20/2018   PT  Treatment/Interventions  ADLs/Self Care Home Management;Therapeutic exercise;Therapeutic activities;Functional mobility training;Gait training;DME Instruction;Balance training;Neuromuscular re-education;Patient/family education;Orthotic Fit/Training    PT Next Visit Plan  Check STG's (pt has had gaps in her therapy due to vacation, etc.).  Transfer training, stair training, gait training for houshold distances; update HEP as able, use SciFit for lower extremity flexibility/strengthening    Consulted and Agree with Plan of Care  Patient       Patient will benefit from skilled therapeutic intervention in order to improve the following deficits and impairments:  Decreased mobility, Decreased strength, Decreased balance, Decreased endurance, Abnormal gait, Difficulty walking, Impaired tone  Visit Diagnosis: 1. Muscle weakness (generalized)   2. Unsteadiness on feet        Problem List Patient Active Problem List   Diagnosis Date Noted  . Cardiomyopathy (HCC)   . Coronary artery disease involving native coronary artery of native heart without angina pectoris   . Ischemic cardiomyopathy 07/04/2017  . 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 Orthostatic hypotension 04/08/2016  . Muscle cramps 04/08/2016  . 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 Cerebral thrombosis with cerebral infarction 04/06/2016  . Basilar artery stenosis   . Dysarthria 04/05/2016  . History of CVA with residual deficit 04/05/2016  . Sinus tachycardia 04/05/2016  . Acute encephalopathy 04/05/2016  . Chronic pain 04/05/2016  . Other hyperlipidemia 04/05/2016  . Ischemic stroke (HCC)   . 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 TIA (transient ischemic attack) 11/07/2014  . Uncontrolled hypertension 11/07/2014  . Controlled type 2 diabetes mellitus with diabetic nephropathy (HCC) 11/07/2014   Newell Coralenise Terry Robertson, [place]A [facility] 01/22/19 10:38 AM Phone: [phone] Fax: [phone]   [facility] [address] [place], [place], [address] Phone: [phone]   Fax:  [phone]  Name: Shelly Mcdaniel MRN: [id number] Date of Birth: 07/06/1955

## 2019-01-22 NOTE — Therapy (Signed)
[facility] [address] [place], [place], [address] Phone: [phone]   Fax:  [phone]  Occupational Therapy Treatment  Patient Details  Name: Shelly SaucierJody H Lister MRN: [id number] Date of Birth: 1955-07-08 Referring Provider (OT): Dr. Claudette LawsAndrew Kirsteins   Encounter Date: 01/22/2019  OT End of Session - 01/22/19 1208    Visit Number  6    Number of Visits  10    Date for OT Re-Evaluation  01/23/19    Authorization Type  6/43/[id number]/847/13/20-10/11/20 cert.  Medicare / BCBS    Authorization - Visit Number  6    Authorization - Number of Visits  10    OT Start Time  1108    OT Stop Time  1150    OT Time Calculation (min)  42 min    Activity Tolerance  Patient tolerated treatment well    Behavior During Therapy  WFL for tasks assessed/performed       Past Medical History:  Diagnosis Date  . Diabetes mellitus without complication (HCC)   . Hyperlipidemia   . Hypertension   . Stroke (HCC)   . UTI (urinary tract infection)     Past Surgical History:  Procedure Laterality Date  . BACK SURGERY    . BREAST SURGERY    . COLONOSCOPY WITH PROPOFOL N/A 12/24/2013   Procedure: COLONOSCOPY WITH PROPOFOL;  Surgeon: Charolett BumpersMartin K Johnson, MD;  Location: [facility];  Service: Endoscopy;  Laterality: N/A;  . RIGHT/LEFT HEART CATH AND CORONARY ANGIOGRAPHY N/A 08/10/2017   Procedure: RIGHT/LEFT HEART CATH AND CORONARY ANGIOGRAPHY;  Surgeon: Lennette BihariKelly, Thomas A, MD;  [facility];  Service: Cardiovascular;  Laterality: N/A;    There were no vitals filed for this visit.  Subjective Assessment - 01/22/19 1207    Subjective   I'm tight today because I didn't do much at the beach.   Can you stretch my arm with me lying down.    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  PMH:  HTN, DM, orthostatic hypotension, hx of adhesive capsulitis  L shoulder, HDL, cardiomyopathy, s/p Botox 08/14/18.      Limitations  fall risk    Patient Stated Goals  to be able to move L hand and squeeze stronger    Currently in Pain?  No/denies         Transfer w/c>mat with min-mod A. (and mat>w/c with min-mod A)  In supine, PROM elbow ext, shoulder flex, shoulder ER, shoulder abduction, supination, finger ext, and wrist ext.  Followed by AAROM elbow ext and shoulder flex with mod facilitation.  Sitting, Wt. Bearing through L hand on mat with body on arm movements with min-mod facilitation/cues  Sitting, using LUE as stabilizer for bowl during simulated stirring.  Stabilizing on table against body with LUE and then holding bowl by handle with LUE with min cueing for positioning with shelf liner under bowl.  Pt able to return demo each successfully.                      OT Short Term Goals - 12/24/18 1410      OT SHORT TERM GOAL #1   Title  Pt will be independnt with updated splint wear/care for spasticity management and improved positioning.--check STGs 11/15/18    Time  4    Period  Weeks    Status  Deferred  12/13/18:  has not brought in current splint for assessment.  12/20/18:  pt reports that splint fits well, but that she hasn't been wearing last week or 2 due to being packed     OT SHORT TERM GOAL #2   Title  Pt/family to verbalize understanding with proper positioning of LUE during activity and in bed to decr risk of future complications including pain.    Time  4    Period  Weeks    Status  Achieved      OT SHORT TERM GOAL #3   Title  ----      OT SHORT TERM GOAL #4   Title  ----      OT SHORT TERM GOAL #5   Title  ----        OT Long Term Goals - 12/24/18 1410      OT LONG TERM GOAL #1   Title  Pt/ caregiver will be independent with updated HEP--check LTGs 12/15/18    Time  8    Period  Weeks    Status  Achieved   12/24/18     OT LONG TERM GOAL #2   Title  Pt will demo improved PROM LUE to be able to  position LUE appropriately on up walker in prep for short distance ambulation.    Time  8    Period  Weeks    Status  Achieved   12/13/18:  per PT/pt     OT LONG TERM GOAL #3   Title  Pt will demo at least 80* PROM L shoulder flex without pain for incr ease/safety with hygiene and UB dressing.    Time  8    Period  Weeks    Status  Achieved   12/13/18:  80-90* in supine     OT LONG TERM GOAL #4   Title  Pt will be able to use LUE as stabilizer for selected ADL/IADL tasks.    Time  8    Period  Weeks    Status  Achieved   12/13/18     OT LONG TERM GOAL #5   Title  Pt will be able to use LUE to apply deodorant under RUE mod I.--check updated goals 01/24/19    Time  5    Period  Weeks    Status  New      Long Term Additional Goals   Additional Long Term Goals  Yes      OT LONG TERM GOAL #6   Title  Pt will be able to use LUE to assist with bathing/washing RUE and tops of legs/stomach.    Time  5    Period  Weeks    Status  New      OT LONG TERM GOAL #7   Title  Pt will be able to hold/stabilize mixing bowl with LUE while stirring with RUE for incr independence for kitchen tasks.    Time  5    Period  Weeks    Status  New      OT LONG TERM GOAL #8   Title  Pt will demo at least 20* active elbow extension to assist with functional reach/in order to open low drawer/cabinet.    Time  5    Period  Weeks    Status  New            Plan - 01/22/19 1208    Clinical Impression Statement  Pt with incr spasticity initially today; however,  after stretching, pt able to use LUE as a stabilizer for bowl in prep for cooking.    OT Occupational Profile and History  Detailed Assessment- Review of Records and additional review of physical, cognitive, psychosocial history related to current functional performance    Occupational performance deficits (Please refer to evaluation for details):  ADL's;IADL's;Leisure;Social Participation    Body Structure / Function / Physical Skills   ADL;GMC;UE functional use;Coordination;IADL;Pain;Strength;Improper spinal/pelvic alignment;Body mechanics;Decreased knowledge of use of DME;Flexibility;Mobility;ROM;Tone;Dexterity    Rehab Potential  Fair    Clinical Decision Making  Several treatment options, min-mod task modification necessary    Comorbidities Affecting Occupational Performance:  Presence of comorbidities impacting occupational performance    Comorbidities impacting occupational performance description:  hx of adhesive capsulitis L shoulder, orthostatic hypotension, cardiomyopathy    Modification or Assistance to Complete Evaluation   Min-Moderate modification of tasks or assist with assess necessary to complete eval    OT Frequency  1x / week    OT Duration  --   5 weeks   OT Treatment/Interventions  Self-care/ADL training;Therapeutic exercise;Patient/family education;Splinting;Neuromuscular education;Moist Heat;Fluidtherapy;Functional Mobility Training;Energy conservation;Therapeutic activities;Cryotherapy;Ultrasound;Contrast Bath;DME and/or AE instruction;Manual Therapy;Passive range of motion;Electrical Stimulation;Paraffin    Plan  functional use of LUE    OT Home Exercise Plan  Education provided:  initial HEP issued 10/16/18    Recommended Other Services  current with PT     Consulted and Agree with Plan of Care  Patient;Family member/caregiver    Family Member Consulted  husband       Patient will benefit from skilled therapeutic intervention in order to improve the following deficits and impairments:   Body Structure / Function / Physical Skills: ADL, GMC, UE functional use, Coordination, IADL, Pain, Strength, Improper spinal/pelvic alignment, Body mechanics, Decreased knowledge of use of DME, Flexibility, Mobility, ROM, Tone, Dexterity       Visit Diagnosis: 1. Hemiplegia and hemiparesis following cerebral infarction affecting left non-dominant side (HCC)   2. Other abnormalities of gait and mobility   3.  Unsteadiness on feet   4. Muscle weakness (generalized)   5. Stiffness of left wrist, not elsewhere classified   6. Stiffness of left hand, not elsewhere classified   7. Abnormal posture   8. Stiffness of left shoulder, not elsewhere classified   9. Other symptoms and signs involving the musculoskeletal system       Problem List Patient Active Problem List   Diagnosis Date Noted  . Cardiomyopathy (HCC)   . Coronary artery disease involving native coronary artery of native heart without angina pectoris   . Ischemic cardiomyopathy 07/04/2017  . 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 Orthostatic hypotension 04/08/2016  . Muscle cramps 04/08/2016  . 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 Cerebral thrombosis with cerebral infarction 04/06/2016  . Basilar artery stenosis   . Dysarthria 04/05/2016  . History of CVA with residual deficit 04/05/2016  . Sinus tachycardia 04/05/2016  . Acute encephalopathy 04/05/2016  . Chronic pain 04/05/2016  . Other hyperlipidemia 04/05/2016  . Ischemic stroke (HCC)   . 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 TIA (transient ischemic attack) 11/07/2014  . Uncontrolled hypertension 11/07/2014  . Controlled type 2 diabetes mellitus with diabetic nephropathy (HCC) 11/07/2014    [facility],[place]quo[place] Esch 01/22/2019, 12:11 PM  [facility] [address] [place], [place], [address] Phone: [phone]   Fax:  [phone]  Name: Shelly SaucierJody H Decola MRN: [id number] Date of Birth:  [birth year]   Willa FraterAngela Charlye Spare, OTR/L [facility] [address] [place], [place]  [address] [phone] phone [phone] 01/22/19 12:11 PM

## 2019-01-29 ENCOUNTER — Encounter: Payer: Self-pay | Admitting: Physical Therapy

## 2019-01-29 ENCOUNTER — Ambulatory Visit: Payer: Medicare Other | Admitting: Physical Therapy

## 2019-01-29 ENCOUNTER — Other Ambulatory Visit: Payer: Self-pay

## 2019-01-29 DIAGNOSIS — M6281 Muscle weakness (generalized): Secondary | ICD-10-CM

## 2019-01-29 DIAGNOSIS — I69354 Hemiplegia and hemiparesis following cerebral infarction affecting left non-dominant side: Secondary | ICD-10-CM | POA: Diagnosis not present

## 2019-01-29 DIAGNOSIS — R2689 Other abnormalities of gait and mobility: Secondary | ICD-10-CM

## 2019-01-29 DIAGNOSIS — R293 Abnormal posture: Secondary | ICD-10-CM

## 2019-01-29 DIAGNOSIS — R2681 Unsteadiness on feet: Secondary | ICD-10-CM

## 2019-01-29 NOTE — Therapy (Signed)
[facility] [address] [name], [place], [address] Phone: [phone]   Fax:  [phone]  Physical Therapy Treatment  Patient Details  Name: Shelly Mcdaniel MRN: [id number] Date of Birth: 28-Nov-1955 Referring Provider (PT): Kirsteins   Encounter Date: 01/29/2019 CLINIC OPERATION CHANGES: [facility] is open at lower capacity following universal masking, social distancing, and patient screening.  The patient's COVID risk of complications score is 5.  PT End of Session - 01/29/19 1313    Visit Number  11    Number of Visits  16    Date for PT Re-Evaluation  03/20/19   per renewal 12/20/2018   Authorization Type  Medicare/BCBS    Authorization Time Period  12/20/2018-03/20/2019    PT Start Time  0940   Pt arrived late   PT Stop Time  1014    PT Time Calculation (min)  34 min    Equipment Utilized During Treatment  Gait belt    Activity Tolerance  Patient tolerated treatment well    Behavior During Therapy  WFL for tasks assessed/performed       Past Medical History:  Diagnosis Date  . Diabetes mellitus without complication ([place])   . Hyperlipidemia   . Hypertension   . Stroke ([place])   . UTI (urinary tract infection)     Past Surgical History:  Procedure Laterality Date  . BACK SURGERY    . BREAST SURGERY    . COLONOSCOPY WITH PROPOFOL N/A 12/24/2013   Procedure: COLONOSCOPY WITH PROPOFOL;  Surgeon: [name], MD;  Location: [facility];  Service: Endoscopy;  Laterality: N/A;  . RIGHT/LEFT HEART CATH AND CORONARY ANGIOGRAPHY N/A 08/10/2017   Procedure: RIGHT/LEFT HEART CATH AND CORONARY ANGIOGRAPHY;  Surgeon: Troy Sine, MD;  Location: [facility];  Service: Cardiovascular;  Laterality: N/A;    There were no vitals filed for this visit.  Subjective Assessment - 01/29/19 0947    Subjective  Blood sugars are better.  No falls.    Patient is accompained by:  Family member   Husband    Pertinent History  HTN, DM, Hyperlipidemia, CVA (01/2016, 03/2016), hx of UTI, cardiomyopathy, CHF    Limitations  Standing;Walking    Patient Stated Goals  Pt's goals for physical therapy are to improve transfers, building strength back in legs.    Currently in Pain?  No/denies                       [facility] Adult PT Treatment/Exercise - 01/29/19 0001      Transfers   Transfers  Sit to Stand;Stand to Sit;Stand Pivot Transfers    Sit to Stand  With upper extremity assist;4: Min assist    Stand to Sit  4: Min assist;With upper extremity assist;To chair/3-in-1;Other (comment);3: Mod assist    Stand to Sit Details (indicate cue type and reason)  Verbal cues for sequencing;Verbal cues for precautions/safety    Stand to Sit Details  2nd trial of stand to sit:  pt requires mod assist    Stand Pivot Transfers  4: Min assist    Comments  With sit<>stand transfer, pt requires assistance to secure and unsecure L hand with [place].  Upon standing in parallel bars:  standing with cues for glut activation, upright posure and lateral weightshifting in parallel bars x 2 minutes.      Ambulation/Gait   Ambulation/Gait  Yes    Ambulation/Gait Assistance  4: Min assist  Ambulation/Gait Assistance Details  Using UP walker    Ambulation Distance (Feet)  115 Feet   x 2   Assistive device  Other (Comment)   Up Walker   Gait Pattern  Step-to pattern;Decreased step length - left;Decreased step length - right;Decreased stride length;Right flexed knee in stance;Left flexed knee in stance;Narrow base of support    Ambulation Surface  Level;Indoor    Stairs  Yes    Stairs Assistance  3: Mod assist    Stairs Assistance Details (indicate cue type and reason)  assist to bring LLE down to next step with descending stairs, increased knee flexion with fatigue    Stair Management Technique  One rail Right;Step to pattern;Forwards    Number of Stairs  4    Height of Stairs  6        Pt able to  maintain standing balance in [place] as PT works to open and secure LUE on [employer] handle.        PT Short Term Goals - 01/29/19 1312      PT SHORT TERM GOAL #1   Title  Pt will verbalize understanding of sequence of transfers, to assist husband with transfer efficiency and safety.  TARGET 01/18/2019    Time  4    Period  Weeks    Status  Achieved    Target Date  01/18/19      PT SHORT TERM GOAL #2   Title  Pt will perform stand pivot transfer with min guard level in order to increase independence at home.    Time  4    Period  Weeks    Status  Achieved    Target Date  01/18/19      PT SHORT TERM GOAL #3   Title  Pt will perform sit to stand with min guard for improved independence with transfers in the home.    Baseline  min assist for transfers    Time  4    Period  Weeks    Status  Not Met    Target Date  01/18/19      PT SHORT TERM GOAL #4   Title  Pt will ambulate 25-50 ft using UPWalker, with husband's min assistance, at least 3 times per week.    Baseline  Reports doing 20-25 ft at home, 5-6 times per week    Time  4    Period  Weeks    Status  Achieved    Target Date  01/18/19        PT Long Term Goals - 01/29/19 1328      PT LONG TERM GOAL #1   Title  Pt will perform progression of HEP for improved flexibility, strength, balance, with husband's assistance.  UPDATED TARGET for all LTGs, due to weeks remaining in POC9/18/2020    Time  8    Period  Weeks    Status  On-going      PT LONG TERM GOAL #2   Title  Pt will perform stand pivot transfer at supervision level for improved independence with transfers, decreased caregiver burden.    Time  8    Period  Weeks    Status  On-going      PT LONG TERM GOAL #3   Title  Pt will perform sit to stand with supervision, for improved independence with transfers and decreased caregiver burden.    Baseline  min guard/min assist    Time  8    Period  Weeks    Status  Revised      PT LONG TERM GOAL #4   Title   Pt will ambulate x 50-75 ft w/ UPWalker at min A level with husband's assistance, at least 4-5 times per week at home as part of ongoing HEP.    Time  8    Period  Weeks    Status  Revised      PT LONG TERM GOAL #5   Title  Pt/husband will verbalize plans for ongoing community fitness program after d/c from PT to maintain gains made in PT.    Time  8    Period  Weeks    Status  New            Plan - 01/29/19 1315    Clinical Impression Statement  Progress note for dates (11 visits, 10/16/2018-01/29/2019); Assessed STGs this visit, with pt meeting 3 of 4 STGs.  Pt is able to verbalize transfer technique and has improved stand pivot transfer technique to min guard level; she continues to need at least min assist for sit to stand transfers.  Pt is improving gait distance in therapy sessions with UPWalker (walking 115 ft x 2) and reports walking at home.  She has missed several visits due to not feeling well missed appts due to moving out of home/into new condo.  Despite several missed appointments, she is making progress and will benefit from skilled PT to work towards [place].    Personal Factors and Comorbidities  Comorbidity 3+;Time since onset of injury/illness/exacerbation    Comorbidities  HTN, DM, Hyperlipidemia, CVA (01/2016, 03/2016), hx of UTI, cardiomyopathy, CHF    Examination-Activity Limitations  Stand;Locomotion Level;Transfers    Examination-Participation Restrictions  Community Activity    Stability/Clinical Decision Making  Evolving/Moderate complexity    Rehab Potential  Fair    PT Frequency  1x / week    PT Duration  8 weeks   per renewal 12/20/2018   PT Treatment/Interventions  ADLs/Self Care Home Management;Therapeutic exercise;Therapeutic activities;Functional mobility training;Gait training;DME Instruction;Balance training;Neuromuscular re-education;Patient/family education;Orthotic Fit/Training    PT Next Visit Plan  Transfer training/functional lower extremity  strengthening, stair negotiation training, gait training for houshold distances; update HEP as able, use SciFit for lower extremity flexibility/strengthening    Consulted and Agree with Plan of Care  Patient       Patient will benefit from skilled therapeutic intervention in order to improve the following deficits and impairments:  Decreased mobility, Decreased strength, Decreased balance, Decreased endurance, Abnormal gait, Difficulty walking, Impaired tone  Visit Diagnosis: 1. Muscle weakness (generalized)   2. Unsteadiness on feet   3. Other abnormalities of gait and mobility   4. Abnormal posture        Problem List Patient Active Problem List   Diagnosis Date Noted  . Cardiomyopathy ([place])   . Coronary artery disease involving native coronary artery of native heart without angina pectoris   . Ischemic cardiomyopathy 07/04/2017  . 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 Orthostatic hypotension 04/08/2016  . Muscle cramps 04/08/2016  . Right pontine cerebrovascular accident ([place]) 04/08/2016  . Spastic hemiparesis of left nondominant side ([place]) 04/08/2016  . Cerebral thrombosis with cerebral infarction 04/06/2016  . Basilar artery stenosis   . Dysarthria 04/05/2016  . History of CVA with residual deficit 04/05/2016  . Sinus tachycardia 04/05/2016  . Acute encephalopathy 04/05/2016  . Chronic pain 04/05/2016  . Other hyperlipidemia 04/05/2016  . Ischemic stroke ([place])   . 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  TIA (transient ischemic attack) 11/07/2014  . Uncontrolled hypertension 11/07/2014  . Controlled type 2 diabetes mellitus with diabetic nephropathy ([place]) 11/07/2014    Calel Pisarski W. 01/29/2019, 1:32 PM  Frazier Butt., PT  [facility] [address] [place], [place], [address] Phone: [phone]   Fax:  [phone]  Name: TEAH VOTAW MRN: [id number] Date of Birth: 1955-09-01

## 2019-01-30 ENCOUNTER — Ambulatory Visit: Payer: Medicare Other | Admitting: Occupational Therapy

## 2019-01-30 ENCOUNTER — Encounter: Payer: Self-pay | Admitting: Occupational Therapy

## 2019-01-30 DIAGNOSIS — R293 Abnormal posture: Secondary | ICD-10-CM

## 2019-01-30 DIAGNOSIS — M6281 Muscle weakness (generalized): Secondary | ICD-10-CM

## 2019-01-30 DIAGNOSIS — I69354 Hemiplegia and hemiparesis following cerebral infarction affecting left non-dominant side: Secondary | ICD-10-CM | POA: Diagnosis not present

## 2019-01-30 DIAGNOSIS — M25612 Stiffness of left shoulder, not elsewhere classified: Secondary | ICD-10-CM

## 2019-01-30 DIAGNOSIS — R29898 Other symptoms and signs involving the musculoskeletal system: Secondary | ICD-10-CM

## 2019-01-30 DIAGNOSIS — R2689 Other abnormalities of gait and mobility: Secondary | ICD-10-CM

## 2019-01-30 DIAGNOSIS — M25632 Stiffness of left wrist, not elsewhere classified: Secondary | ICD-10-CM

## 2019-01-30 DIAGNOSIS — R2681 Unsteadiness on feet: Secondary | ICD-10-CM

## 2019-01-30 DIAGNOSIS — M25642 Stiffness of left hand, not elsewhere classified: Secondary | ICD-10-CM

## 2019-01-30 NOTE — Therapy (Signed)
[facility] [address] [place], [place], [address] Phone: [phone]   Fax:  [phone]  Occupational Therapy Treatment  Patient Details  Name: Shelly Mcdaniel MRN: [id number] Date of Birth: April 28, 1956 Referring Provider (OT): Dr. Claudette LawsAndrew Kirsteins   Encounter Date: 01/30/2019  OT End of Session - 01/30/19 1335    Visit Number  8    Number of Visits  10    Date for OT Re-Evaluation  01/23/19    Authorization Type  6/96/29-52/84/137/13/20-10/11/20 cert.  Medicare / BCBS    Authorization - Visit Number  8    Authorization - Number of Visits  10    OT Start Time  1235    OT Stop Time  1315    OT Time Calculation (min)  40 min    Activity Tolerance  Patient tolerated treatment well    Behavior During Therapy  WFL for tasks assessed/performed       Past Medical History:  Diagnosis Date  . Diabetes mellitus without complication (H[facility])   . Hyperlipidemia   . Hypertension   . Stroke ([facility])   . UTI (urinary tract infection)     Past Surgical History:  Procedure Laterality Date  . BACK SURGERY    . BREAST SURGERY    . COLONOSCOPY WITH PROPOFOL N/A 12/24/2013   Procedure: COLONOSCOPY WITH PROPOFOL;  Surgeon: Charolett BumpersMartin K Johnson, MD;  [facility];  Service: Endoscopy;  Laterality: N/A;  . RIGHT/LEFT HEART CATH AND CORONARY ANGIOGRAPHY N/A 08/10/2017   Procedure: RIGHT/LEFT HEART CATH AND CORONARY ANGIOGRAPHY;  Surgeon: Lennette BihariKelly, Thomas A, MD;  [facility];  Service: Cardiovascular;  Laterality: N/A;    There were no vitals filed for this visit.  Subjective Assessment - 01/30/19 1322    Subjective   It's tight because its almost time for Botox again, but I've been stretching    Patient is accompanied by:  Family member   husband   Pertinent History  Chronic right pontine infarct with left hemiparesis and spasticity 01/2016.  PMH:  HTN, DM, orthostatic hypotension, hx of adhesive capsulitis L shoulder, HDL,  cardiomyopathy, s/p Botox 08/14/18.      Limitations  fall risk    Patient Stated Goals  to be able to move L hand and squeeze stronger    Currently in Pain?  No/denies    Pain Score  --    Pain Location  --    Pain Descriptors / Indicators  --    Pain Type  --    Pain Onset  --    Aggravating Factors   --    Pain Relieving Factors  --           Transfer w/c>mat with min A. (and mat>w/c with min A), Supine>sitting encouraging/cueing to roll to L side with min-mod A/cues  In supine, PROM elbow ext, shoulder flex, shoulder ER, shoulder abduction, supination, finger ext, and wrist ext.  [facility] and shoulder flex and ER with mod facilitation.  Simulated putting on deodorant with LUE with min facilitation for reach (Pt instructed in leaning forward so that decr shoulder ROM is required by LUE for task).  Simulated washing legs with LUE with min facilitation (pt instructed in hand over hand technique and recommended use of bath mitt/puff/sponge to make it easier to grasp).  Then practiced active relaxation and thumb ext to assist in release of objects with mod cueing and min facilitation, improved with repetition.  OT Education - 01/30/19 1333    Education Details  Actively relax hand and extend thumb as able prior to removing objects from hand to assist with releasing and manage spasticity; use hand over hand assist for applying deodorant, reaching, bathing etc (simulated with min cueing)    Person(s) Educated  Patient    Methods  Explanation;Demonstration;Verbal cues;Tactile cues    Comprehension  Verbalized understanding;Returned demonstration;Verbal cues required       OT Short Term Goals - 12/24/18 1410      OT SHORT TERM GOAL #1   Title  Pt will be independnt with updated splint wear/care for spasticity management and improved positioning.--check STGs 11/15/18    Time  4    Period  Weeks    Status  Deferred   12/13/18:  has not brought in current splint  for assessment.  12/20/18:  pt reports that splint fits well, but that she hasn't been wearing last week or 2 due to being packed     OT SHORT TERM GOAL #2   Title  Pt/family to verbalize understanding with proper positioning of LUE during activity and in bed to decr risk of future complications including pain.    Time  4    Period  Weeks    Status  Achieved      OT SHORT TERM GOAL #3   Title  ----      OT SHORT TERM GOAL #4   Title  ----      OT SHORT TERM GOAL #5   Title  ----        OT Long Term Goals - 12/24/18 1410      OT LONG TERM GOAL #1   Title  Pt/ caregiver will be independent with updated HEP--check LTGs 12/15/18    Time  8    Period  Weeks    Status  Achieved   12/24/18     OT LONG TERM GOAL #2   Title  Pt will demo improved PROM LUE to be able to position LUE appropriately on up walker in prep for short distance ambulation.    Time  8    Period  Weeks    Status  Achieved   12/13/18:  per PT/pt     OT LONG TERM GOAL #3   Title  Pt will demo at least 80* PROM L shoulder flex without pain for incr ease/safety with hygiene and UB dressing.    Time  8    Period  Weeks    Status  Achieved   12/13/18:  80-90* in supine     OT LONG TERM GOAL #4   Title  Pt will be able to use LUE as stabilizer for selected ADL/IADL tasks.    Time  8    Period  Weeks    Status  Achieved   12/13/18     OT LONG TERM GOAL #5   Title  Pt will be able to use LUE to apply deodorant under RUE mod I.--check updated goals 01/24/19    Time  5    Period  Weeks    Status  New      Long Term Additional Goals   Additional Long Term Goals  Yes      OT LONG TERM GOAL #6   Title  Pt will be able to use LUE to assist with bathing/washing RUE and tops of legs/stomach.    Time  5    Period  Weeks  Status  New      OT LONG TERM GOAL #7   Title  Pt will be able to hold/stabilize mixing bowl with LUE while stirring with RUE for incr independence for kitchen tasks.    Time  5    Period  Weeks     Status  New      OT LONG TERM GOAL #8   Title  Pt will demo at least 20* active elbow extension to assist with functional reach/in order to open low drawer/cabinet.    Time  5    Period  Weeks    Status  New            Plan - 01/30/19 1335    Clinical Impression Statement  Pt is progressing towards goals with improved understanding of how to incorporate LUE into functional tasks/ADLs.    OT Occupational Profile and History  Detailed Assessment- Review of Records and additional review of physical, cognitive, psychosocial history related to current functional performance    Occupational performance deficits (Please refer to evaluation for details):  ADL's;IADL's;Leisure;Social Participation    Body Structure / Function / Physical Skills  ADL;GMC;UE functional use;Coordination;IADL;Pain;Strength;Improper spinal/pelvic alignment;Body mechanics;Decreased knowledge of use of DME;Flexibility;Mobility;ROM;Tone;Dexterity    Rehab Potential  Fair    Clinical Decision Making  Several treatment options, min-mod task modification necessary    Comorbidities Affecting Occupational Performance:  Presence of comorbidities impacting occupational performance    Comorbidities impacting occupational performance description:  hx of adhesive capsulitis L shoulder, orthostatic hypotension, cardiomyopathy    Modification or Assistance to Complete Evaluation   Min-Moderate modification of tasks or assist with assess necessary to complete eval    OT Frequency  1x / week    OT Duration  --   5 weeks   OT Treatment/Interventions  Self-care/ADL training;Therapeutic exercise;Patient/family education;Splinting;Neuromuscular education;Moist Heat;Fluidtherapy;Functional Mobility Training;Energy conservation;Therapeutic activities;Cryotherapy;Ultrasound;Contrast Bath;DME and/or AE instruction;Manual Therapy;Passive range of motion;Electrical Stimulation;Paraffin    Plan  continue with functional use of LUE    OT Home  Exercise Plan  Education provided:  initial HEP issued 10/16/18    Recommended Other Services  current with PT     Consulted and Agree with Plan of Care  Patient;Family member/caregiver    Family Member Consulted  husband       Patient will benefit from skilled therapeutic intervention in order to improve the following deficits and impairments:   Body Structure / Function / Physical Skills: ADL, GMC, UE functional use, Coordination, IADL, Pain, Strength, Improper spinal/pelvic alignment, Body mechanics, Decreased knowledge of use of DME, Flexibility, Mobility, ROM, Tone, Dexterity       Visit Diagnosis: 1. Hemiplegia and hemiparesis following cerebral infarction affecting left non-dominant side (HCC)   2. Other symptoms and signs involving the musculoskeletal system   3. Muscle weakness (generalized)   4. Stiffness of left wrist, not elsewhere classified   5. Stiffness of left hand, not elsewhere classified   6. Stiffness of left shoulder, not elsewhere classified   7. Unsteadiness on feet   8. Abnormal posture   9. Other abnormalities of gait and mobility       Problem List Patient Active Problem List   Diagnosis Date Noted  . Cardiomyopathy (HCC)   . Coronary artery disease involving native coronary artery of native heart without angina pectoris   . Ischemic cardiomyopathy 07/04/2017  . 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 Orthostatic hypotension 04/08/2016  . Muscle cramps 04/08/2016  . 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  Spastic hemiparesis of left nondominant side ([place]) 04/08/2016  . Cerebral thrombosis with cerebral infarction 04/06/2016  . Basilar artery stenosis   . Dysarthria 04/05/2016  . History of CVA with residual deficit 04/05/2016  . Sinus tachycardia 04/05/2016  . Acute encephalopathy 04/05/2016  . Chronic pain 04/05/2016  . Other hyperlipidemia 04/05/2016  . Ischemic stroke ([place])   . 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 TIA  (transient ischemic attack) 11/07/2014  . Uncontrolled hypertension 11/07/2014  . Controlled type 2 diabetes mellitus with diabetic nephropathy ([place]) 11/07/2014    [facility] 01/30/2019, 1:36 PM  [facility] [address] [place], [place], [address] Phone: [phone]   Fax:  [phone]  Name: Shelly Mcdaniel MRN: [id number] Date of Birth: 1956-01-31   Vianne Bulls, OTR/L [facility] [address] [place], [place]  [address] [phone] phone [phone] 01/30/19 1:36 PM

## 2019-02-01 ENCOUNTER — Other Ambulatory Visit: Payer: Self-pay

## 2019-02-01 ENCOUNTER — Encounter: Payer: Self-pay | Admitting: Physical Medicine & Rehabilitation

## 2019-02-01 ENCOUNTER — Encounter: Payer: Medicare Other | Attending: Physical Medicine & Rehabilitation | Admitting: Physical Medicine & Rehabilitation

## 2019-02-01 VITALS — BP 170/100 | HR 74 | Temp 98.3°F

## 2019-02-01 DIAGNOSIS — G8110 Spastic hemiplegia affecting unspecified side: Secondary | ICD-10-CM | POA: Diagnosis not present

## 2019-02-01 DIAGNOSIS — I6350 Cerebral infarction due to unspecified occlusion or stenosis of unspecified cerebral artery: Secondary | ICD-10-CM | POA: Diagnosis not present

## 2019-02-01 DIAGNOSIS — G8114 Spastic hemiplegia affecting left nondominant side: Secondary | ICD-10-CM | POA: Diagnosis not present

## 2019-02-01 NOTE — Patient Instructions (Signed)
Keep up with home exercise AbobotulinumtoxinA injection What is this medicine? ABOBOTULINUMTOXINA (ay boh BOT yoo li num TOX in A) is a neuro-muscular blocker. This medicine is used to treat severe neck muscle spasms. It is also used to treat muscle spasms in the elbow, wrist, and finger muscles in adults and in the calf muscles in children. It is also used to treat frown lines or lines between the eyebrows on the face. This medicine may be used for other purposes; ask your health care provider or pharmacist if you have questions. COMMON BRAND NAME(S): Dysport What should I tell my health care provider before I take this medicine? They need to know if you have any of these conditions:  breathing problems  diabetes  heart problems  history of surgery where this medicine is going to be used  infection where this medicine is going to be used  myasthenia gravis or other neurologic disease  nerve or muscle disease  surgery plans  an unusual or allergic reaction to botulinum toxin, albumin, cow's milk protein, other medicines, foods, dyes, or preservatives  pregnant or trying to get pregnant  breast-feeding How should I use this medicine? This medicine is for injection into a muscle. It is given by a health care professional in a hospital or clinic setting. A special MedGuide will be given to you with each prescription and refill. Be sure to read this information carefully each time. Talk to your pediatrician regarding the use of this medicine in children. While this drug may be prescribed for children as young as 2 years for selected conditions, precautions to apply. Overdosage: If you think you have taken too much of this medicine contact a poison control center or emergency room at once. NOTE: This medicine is only for you. Do not share this medicine with others. What if I miss a dose? This does not apply. What may interact with this medicine?  aminoglycoside antibiotics like  gentamicin, neomycin, tobramycin  muscle relaxants  other botulinum toxin injections This list may not describe all possible interactions. Give your health care provider a list of all the medicines, herbs, non-prescription drugs, or dietary supplements you use. Also tell them if you smoke, drink alcohol, or use illegal drugs. Some items may interact with your medicine. What should I watch for while using this medicine? Visit your doctor for regular check-ups. This medicine will cause weakness in the muscle where it is injected. Tell your doctor if you feel unusually weak in other muscles. Get medical help right away if you have problems with breathing, swallowing, or talking. This medicine contains albumin from human blood. It may be possible to pass an infection in this medicine, but no cases have been reported. Talk to your doctor about the risks and benefits of this medicine. If your activities have been limited by your condition, go back to your regular routine slowly after treatment with this medicine. This medicine can make your muscles weak. And, this medicine can make your eyelids droop or make you see blurry or double. If you have weak muscles or trouble seeing do not drive a car, use machinery, or do other dangerous activities. This medicine may cause dry eyes and blurred vision. If you wear contact lenses, you may feel some discomfort. Lubricating eye drops may help. See your healthcare professional if the problem does not go away or is severe. What side effects may I notice from receiving this medicine? Side effects that you should report to your doctor or health  care professional as soon as possible:  allergic reactions like skin rash, itching or hives, swelling of the face, lips, or tongue  breathing problems  changes in vision  chest pain or tightness  eye swelling or drooping of the eyelids  fast, irregular heartbeat  loss of bladder control  numbness or  weakness  signs and symptoms of infection like fever or chills; cough; flu-like symptoms; sore throat  numbness or weakness  speech problems  swallowing problems Side effects that usually do not require medical attention (report to your doctor or health care professional if they continue or are bothersome):  bruising or pain at site where injected  dizziness  dry eyes or eye irritation  dry mouth  headache  runny nose This list may not describe all possible side effects. Call your doctor for medical advice about side effects. You may report side effects to FDA at 1-800-FDA-1088. Where should I keep my medicine? This drug is given in a hospital or clinic and will not be stored at home. NOTE: This sheet is a summary. It may not cover all possible information. If you have questions about this medicine, talk to your doctor, pharmacist, or health care provider.  2020 Elsevier/Gold Standard (2017-04-21 12:18:24)

## 2019-02-01 NOTE — Progress Notes (Signed)
Subjective:    Patient ID: Shelly Mcdaniel, female    DOB: [birth year], 63 y.o.   MRN: [id number]  HPI  63 year old female with history of right pontine infarct in 2016.  She has had persistence of left hemiparesis with spasticity In the community she is mainly in a wheelchair. She requires assistance for self-care.   botox injection 6/2 Biceps 150  Brachioradialis 50  FDS50 FDP 50 FCR 50 FPL 50U Pain Inventory Average Pain 0 Pain Right Now 0 My pain is intermittent, dull, stabbing and aching  In the last 24 hours, has pain interfered with the following? General activity 4 Relation with others 2 Enjoyment of life 2 What TIME of day is your pain at its worst? daytime Sleep (in general) Good  Pain is worse with: walking and some activites Pain improves with: medication Relief from Meds: 8  Mobility walk with assistance how many minutes can you walk? 5 ability to climb steps?  no do you drive?  no use a wheelchair needs help with transfers  Function disabled: date disabled na I need assistance with the following:  dressing, bathing, toileting, meal prep, household duties and shopping  Neuro/Psych weakness tremor trouble walking spasms  Prior Studies Any changes since last visit?  no  Physicians involved in your care Any changes since last visit?  no   Family History  Problem Relation Age of Onset   Stroke Mother    Dementia Mother    Diabetes Father    Stroke Father    CAD Father    Diabetes Sister    Diabetes Brother    CAD Brother    Stroke Brother    Kidney failure Brother    Hypertension Other    Hyperlipidemia Other    Stroke Other    Heart disease Other    Diabetes Other    Social History   Socioeconomic History   Marital status: Married    Spouse name: Not on file   Number of children: Not on file   Years of education: Not on file   Highest education level: Not on file  Occupational History   Not on file   Social Needs   Financial resource strain: Not on file   Food insecurity    Worry: Not on file    Inability: Not on file   Transportation needs    Medical: Not on file    Non-medical: Not on file  Tobacco Use   Smoking status: Never Smoker   Smokeless tobacco: Never Used  Substance and Sexual Activity   Alcohol use: No    Alcohol/week: 0.0 standard drinks   Drug use: No   Sexual activity: Not on file  Lifestyle   Physical activity    Days per week: Not on file    Minutes per session: Not on file   Stress: Not on file  Relationships   Social connections    Talks on phone: Not on file    Gets together: Not on file    Attends religious service: Not on file    Active member of club or organization: Not on file    Attends meetings of clubs or organizations: Not on file    Relationship status: Not on file  Other Topics Concern   Not on file  Social History Narrative   Not on file   Past Surgical History:  Procedure Laterality Date   BACK SURGERY     BREAST SURGERY     COLONOSCOPY WITH  PROPOFOL N/A 12/24/2013   Procedure: COLONOSCOPY WITH PROPOFOL;  Surgeon: [name], MD;  Location: [facility];  Service: Endoscopy;  Laterality: N/A;   RIGHT/LEFT HEART CATH AND CORONARY ANGIOGRAPHY N/A 08/10/2017   Procedure: RIGHT/LEFT HEART CATH AND CORONARY ANGIOGRAPHY;  Surgeon: Troy Sine, MD;  Location: [facility];  Service: Cardiovascular;  Laterality: N/A;   Past Medical History:  Diagnosis Date   Diabetes mellitus without complication (HCC)    Hyperlipidemia    Hypertension    Stroke ([place])    UTI (urinary tract infection)    BP (!) 170/100    Pulse 74    Temp 98.3 F (36.8 C)    SpO2 95%   Opioid Risk Score:   Fall Risk Score:  `1  Depression screen PHQ 2/9  Depression screen [facility] 2/9 03/08/2018 12/18/2017 10/12/2017 07/14/2016 05/12/2016  Decreased Interest 0 0 0 0 3  Down, Depressed, Hopeless 0 0 0 0 0  PHQ - 2 Score 0 0 0 0 3    Altered sleeping - - - - 2  Tired, decreased energy - - - - 3  Feeling bad or failure about yourself  - - - - 0  Trouble concentrating - - - - 3  Moving slowly or fidgety/restless - - - - 2  Suicidal thoughts - - - - 0  PHQ-9 Score - - - - 13  Difficult doing work/chores - - - - Very difficult  Some recent data might be hidden    Review of Systems  Constitutional: Negative.   HENT: Negative.   Eyes: Negative.   Respiratory: Negative.   Cardiovascular: Negative.   Gastrointestinal: Negative.   Endocrine: Negative.   Genitourinary: Negative.   Musculoskeletal: Positive for gait problem.  Skin: Negative.   Allergic/Immunologic: Negative.   Hematological: Negative.   Psychiatric/Behavioral: Negative.   All other systems reviewed and are negative.      Objective:   Physical Exam Vitals signs and nursing note reviewed.  Constitutional:      Appearance: Normal appearance.  Eyes:     Extraocular Movements: Extraocular movements intact.     Conjunctiva/sclera: Conjunctivae normal.     Pupils: Pupils are equal, round, and reactive to light.  Skin:    General: Skin is warm and dry.  Neurological:     General: No focal deficit present.     Mental Status: She is alert and oriented to person, place, and time.     Comments: Motor strength is to minus at the bicep and tricep 0 at the finger extensors trace finger flexors to minus at the shoulder abductors. Tone MAS 3 at the elbow flexors MAS 2 at the finger flexors MAS 0 at the thumb flexor MAS 3 at the pectoralis  Psychiatric:        Mood and Affect: Mood normal.        Behavior: Behavior normal.           Assessment & Plan:  #1.  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  Good response with current botulinum toxin dosing and muscle group selection.  We will continue with current plan botox injection on or after 02/13/2019 Left Biceps 150  Brachioradialis 50  FDS50 FDP 50 FCR 50 FPL 50U

## 2019-02-05 ENCOUNTER — Encounter: Payer: Self-pay | Admitting: Neurology

## 2019-02-05 ENCOUNTER — Ambulatory Visit (INDEPENDENT_AMBULATORY_CARE_PROVIDER_SITE_OTHER): Payer: Medicare Other | Admitting: Neurology

## 2019-02-05 ENCOUNTER — Other Ambulatory Visit: Payer: Self-pay

## 2019-02-05 ENCOUNTER — Ambulatory Visit: Payer: Medicare Other | Admitting: Physical Therapy

## 2019-02-05 ENCOUNTER — Encounter: Payer: Self-pay | Admitting: Physical Therapy

## 2019-02-05 VITALS — BP 155/83 | HR 79 | Temp 96.2°F | Wt 202.0 lb

## 2019-02-05 VITALS — BP 139/85 | HR 72

## 2019-02-05 DIAGNOSIS — R2689 Other abnormalities of gait and mobility: Secondary | ICD-10-CM

## 2019-02-05 DIAGNOSIS — G8110 Spastic hemiplegia affecting unspecified side: Secondary | ICD-10-CM | POA: Diagnosis not present

## 2019-02-05 DIAGNOSIS — I6350 Cerebral infarction due to unspecified occlusion or stenosis of unspecified cerebral artery: Secondary | ICD-10-CM

## 2019-02-05 DIAGNOSIS — M6281 Muscle weakness (generalized): Secondary | ICD-10-CM

## 2019-02-05 DIAGNOSIS — I69354 Hemiplegia and hemiparesis following cerebral infarction affecting left non-dominant side: Secondary | ICD-10-CM | POA: Diagnosis not present

## 2019-02-05 DIAGNOSIS — R2681 Unsteadiness on feet: Secondary | ICD-10-CM

## 2019-02-05 NOTE — Patient Instructions (Signed)
I had a long d/w patient and her husband about her remote stroke and spastic hemiplegia, risk for recurrent stroke/TIAs, personally independently reviewed imaging studies and stroke evaluation results and answered questions.Continue Plavix 75 mg daily for secondary stroke prevention and maintain strict control of hypertension with blood pressure goal below 130/90, diabetes with hemoglobin A1c goal below 6.5% and lipids with LDL cholesterol goal below 70 mg/dL. I also advised the patient to eat a healthy diet with plenty of whole grains, cereals, fruits and vegetables, exercise regularly and maintain ideal body weight she was advised to use a cane and 1 person assistance at all times while walking. We also discussed fall and safety prevention precautions. . Followup in the future with me only as needed or call earlier if necessary

## 2019-02-05 NOTE — Therapy (Signed)
[facility]cd[facility] [address] [place], [place], [address] Phone: [phone]   Fax:  [phone]  Physical Therapy Treatment  Patient Details  Name: Shelly Mcdaniel MRN: [id number] Date of Birth: 17-May-1956 Referring Provider (PT): Shelly Mcdaniel   Encounter Date: 02/05/2019  PT End of Session - 02/05/19 1420    Visit Number  12    Number of Visits  16    Date for PT Re-Evaluation  03/20/19   per renewal 12/20/2018   Authorization Type  Medicare/BCBS    Authorization Time Period  12/20/2018-03/20/2019    PT Start Time  1320    PT Stop Time  1401    PT Time Calculation (min)  41 min    Equipment Utilized During Treatment  Gait belt    Activity Tolerance  Patient tolerated treatment well    Behavior During Therapy  [facility] for tasks assessed/performed       Past Medical History:  Diagnosis Date  . Diabetes mellitus without complication ([facility])   . Hyperlipidemia   . Hypertension   . Stroke ([facility])   . UTI (urinary tract infection)     Past Surgical History:  Procedure Laterality Date  . BACK SURGERY    . BREAST SURGERY    . COLONOSCOPY WITH PROPOFOL N/A 12/24/2013   Procedure: COLONOSCOPY WITH PROPOFOL;  Surgeon: Shelly Fair, MD;  Location: [facility];  Service: Mcdaniel;  Laterality: N/A;  . RIGHT/LEFT HEART CATH AND CORONARY ANGIOGRAPHY N/A 08/10/2017   Procedure: RIGHT/LEFT HEART CATH AND CORONARY ANGIOGRAPHY;  Surgeon: Shelly Sine, MD;  [facility]: [facility];  Service: Cardiovascular;  Laterality: N/A;    Vitals:   02/05/19 1329  BP: 139/85  Pulse: 72    Subjective Assessment - 02/05/19 1325    Subjective  Denies falls.  Have been working on steps.  Blood sugars still good.    Patient is accompained by:  Family member   Husband   Pertinent History  HTN, DM, Hyperlipidemia, CVA (01/2016, 03/2016), hx of UTI, cardiomyopathy, CHF    Limitations  Standing;Walking    Patient Stated Goals  Pt's goals for  physical therapy are to improve transfers, building strength back in legs.    Currently in Pain?  No/denies          [facility] Adult PT Treatment/Exercise - 02/05/19 0001      Transfers   Transfers  Sit to Stand;Stand to Sit;Stand Pivot Transfers    Sit to Stand  4: Min assist;3: Mod assist;With upper extremity assist;With armrests;From chair/3-in-1;Multiple attempts    Sit to Stand Details  Manual facilitation for weight shifting;Manual facilitation for weight bearing;Verbal cues for technique;Tactile cues for sequencing    Stand to Sit  3: Mod assist;4: Min assist;With upper extremity assist;With armrests;To chair/3-in-1    Stand to Sit Details (indicate cue type and reason)  Manual facilitation for weight shifting;Verbal cues for technique;Tactile cues for placement    Comments  Required multiple attempts to achieve standing through out session.  Pt need cues for technique and placement along with the physical assist.      Ambulation/Gait   Ambulation/Gait  Yes    Ambulation/Gait Assistance  3: Mod assist    Ambulation/Gait Assistance Details  used coban to hold L hand onto walker grip    Ambulation Distance (Feet)  80 Feet    Assistive device  Other (Comment)   UP walker   Gait Pattern  Step-to pattern;Decreased step length - left;Decreased step  length - right;Decreased stride length;Right flexed knee in stance;Left flexed knee in stance;Narrow base of support   "drags" LLE at times especially with fatigue   Ambulation Surface  Level;Indoor    Stairs  Yes    Stairs Assistance  3: Mod assist    Stairs Assistance Details (indicate cue type and reason)  assist for LLE placment and advancement;increased LLE knee flexion with fatigue and slight buckle at times    Stair Management Technique  One rail Right;Step to pattern;Forwards    Number of Stairs  4    Height of Stairs  6      Self-Care   Self-Care  Other Self-Care Comments    Other Self-Care Comments   Discussed wear on L shoe and  toe cap on that shoe is coming unattached.  Pt reports she has ordered new tennis shoes.  Recommend to wait on new shoes to replace cap.      Knee/Hip Exercises: Aerobic   Stepper  Scifit level 1.5 x 12 minutes for flexibility with bil LE's only and from w/c with PTA holding w/c in place             PT Education - 02/05/19 1423    Education Details  Needing new leather toe cap on L shoe.  Pt has ordered new tennis shoes so recommend waiting on new shoes.    [name] Educated  Patient    Methods  Explanation    Comprehension  Verbalized understanding       PT Short Term Goals - 01/29/19 1312      PT SHORT TERM GOAL #1   Title  Pt will verbalize understanding of sequence of transfers, to assist husband with transfer efficiency and safety.  TARGET 01/18/2019    Time  4    Period  Weeks    Status  Achieved    Target Date  01/18/19      PT SHORT TERM GOAL #2   Title  Pt will perform stand pivot transfer with min guard level in order to increase independence at home.    Time  4    Period  Weeks    Status  Achieved    Target Date  01/18/19      PT SHORT TERM GOAL #3   Title  Pt will perform sit to stand with min guard for improved independence with transfers in the home.    Baseline  min assist for transfers    Time  4    Period  Weeks    Status  Not Met    Target Date  01/18/19      PT SHORT TERM GOAL #4   Title  Pt will ambulate 25-50 ft using UPWalker, with husband's min assistance, at least 3 times per week.    Baseline  Reports doing 20-25 ft at home, 5-6 times per week    Time  4    Period  Weeks    Status  Achieved    Target Date  01/18/19        PT Long Term Goals - 01/29/19 1328      PT LONG TERM GOAL #1   Title  Pt will perform progression of HEP for improved flexibility, strength, balance, with husband's assistance.  UPDATED TARGET for all LTGs, due to weeks remaining in POC9/18/2020    Time  8    Period  Weeks    Status  On-going      PT LONG TERM  GOAL #  2   Title  Pt will perform stand pivot transfer at supervision level for improved independence with transfers, decreased caregiver burden.    Time  8    Period  Weeks    Status  On-going      PT LONG TERM GOAL #3   Title  Pt will perform sit to stand with supervision, for improved independence with transfers and decreased caregiver burden.    Baseline  min guard/min assist    Time  8    Period  Weeks    Status  Revised      PT LONG TERM GOAL #4   Title  Pt will ambulate x 50-75 ft w/ UPWalker at min A level with husband's assistance, at least 4-5 times per week at home as part of ongoing HEP.    Time  8    Period  Weeks    Status  Revised      PT LONG TERM GOAL #5   Title  Pt/husband will verbalize plans for ongoing community fitness program after d/c from PT to maintain gains made in PT.    Time  8    Period  Weeks    Status  New            Plan - 02/05/19 1421    Clinical Impression Statement  Skilled session focused on gait, stairs and strengthening/endurance.  Pt requires significant cues (verbal and tactile) along with physical assist for L foot placement and awareness with actvities.  Pt continues with decreased endurance but slowly improving.  Continue PT per POC.    Personal Factors and Comorbidities  Comorbidity 3+;Time since onset of injury/illness/exacerbation    Comorbidities  HTN, DM, Hyperlipidemia, CVA (01/2016, 03/2016), hx of UTI, cardiomyopathy, CHF    Examination-Activity Limitations  Stand;Locomotion Level;Transfers    Examination-Participation Restrictions  Community Activity    Stability/Clinical Decision Making  Evolving/Moderate complexity    Rehab Potential  Mcdaniel    PT Frequency  1x / week    PT Duration  8 weeks   per renewal 12/20/2018   PT Treatment/Interventions  ADLs/Self Care Home Management;Therapeutic exercise;Therapeutic activities;Functional mobility training;Gait training;DME Instruction;Balance training;Neuromuscular  re-education;Patient/family education;Orthotic Fit/Training    PT Next Visit Plan  Ask husband about how much pt/husband are able to walk at home. Transfer training/functional lower extremity strengthening, stair negotiation training, gait training for houshold distances; update HEP as able, use SciFit for lower extremity flexibility/strengthening    Consulted and Agree with Plan of Care  Patient       Patient will benefit from skilled therapeutic intervention in order to improve the following deficits and impairments:  Decreased mobility, Decreased strength, Decreased balance, Decreased endurance, Abnormal gait, Difficulty walking, Impaired tone  Visit Diagnosis: Muscle weakness (generalized)  Other abnormalities of gait and mobility  Unsteadiness on feet     Problem List Patient Active Problem List   Diagnosis Date Noted  . Cardiomyopathy ([place])   . Coronary artery disease involving native coronary artery of native heart without angina pectoris   . Ischemic cardiomyopathy 07/04/2017  . 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 Orthostatic hypotension 04/08/2016  . Muscle cramps 04/08/2016  . Right pontine cerebrovascular accident ([place]) 04/08/2016  . Spastic hemiparesis of left nondominant side ([place]) 04/08/2016  . Cerebral thrombosis with cerebral infarction 04/06/2016  . Basilar artery stenosis   . Dysarthria 04/05/2016  . History of CVA with residual deficit 04/05/2016  . Sinus tachycardia 04/05/2016  . Acute encephalopathy 04/05/2016  .  Chronic pain 04/05/2016  . Other hyperlipidemia 04/05/2016  . Ischemic stroke ([place])   . 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 TIA (transient ischemic attack) 11/07/2014  . Uncontrolled hypertension 11/07/2014  . 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 [place], [place], [address] Phone: [phone]   Fax:  [phone]  Name: Shelly Mcdaniel MRN: [id number] Date of Birth: 10-Mar-1956

## 2019-02-05 NOTE — Progress Notes (Signed)
STROKE NEUROLOGY FOLLOW UP NOTE  NAME: Shelly SaucierJody H Noteboom DOB: Jan 12, 1956  REASON FOR VISIT: stroke follow up HISTORY FROM: husband and pt and chart  Today we had the pleasure of seeing Shelly Mcdaniel in follow-up at our Neurology [facility]. Pt was accompanied by husband.   History Summary Shelly Mcdaniel is a 63 y.o. 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 she had right pontine stroke in 01/2016 and was treated in [facility]. TEE and CUS unremarkable and she was discharged with ASA and zocor. She continued to have left hemiparesis. After admission, stroke work up showed again right pontine and b/l cerebellar peduncle infarcts. 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 EEG generalized slowing and no seizure. LDL 110, A1C 6.7. She was put on DAPT and crestor. She was also found to have orthostatic hypotension. Put on TED hose and BP goal 130-150. She was later discharged to CIR.    05/23/16 follow up - the patient has been doing the same. She is now home. She was initially improving on the left hemiparesis but recently developed continued UTI even with antibiotics. As per son, all her improvement has been disappeared due to UTI. Her BP at home lying about 150-170, sitting 130s and standing would drop to 100s. Today in clinic BP 123/85  07/20/16 follow up - pt has been doing better. She had at least UTI x 2, on cipro and recently referred to urology and pending further work up with CT or ultrasound. Fortunately, no sepsis either time. She continued to work with outpt PT/OT and her left LE much improved on strength, however, LUE still flaccid. Working with PT/OT still has intermittent orthostatic hypotension which sometimes aborted her rehab session. However, as per husband, pt BP at home sitting 130/90, lying 150s and standing lower  than 130 but not remeber exact number. She is on metoprolol 25mg  daily. As per husband, daughter who is [place]hesiologist [facility] [place] would like pt to have low dose lisinopril for her cardiac and renal protection. BP today in clinic sitting 146/93.   08/31/16 follow up - pt has one episode of staring spells on 08/24/16 lasting about 5min, no shaking jerking or b/b incontinence at that time. Did not check BP. She was sent to ED where she was back to her baseline. Pt can not remember the event. CT head no bleeding. UA negative and CBC BMP normal except K 3.2. She was not put on AEDs at that time.  She was recently discharged from PT/OT and followed with Dr. Wynn BankerKirsteins who does not think pt needs ongoing botox or ritalin treatment. Still has left hemiplegia. BP today 160/104, however, stated her BP at home or other doctor's office always 120-140s. Currently at home, no symptoms with sitting or standing. Working on transition and walking with quad cane at home currently. Pt decreased baclofen and zoloft dose and pt seems more awake alert today in clinic. DM and glucose better controlled. Glucose today 108.   01/03/17 follow up - pt has been pretty much the same. Came in with wheelchair, but able to use cane to transition herself. Continued follow up with Dr. Doroteo BradfordKirstein and received botox injection. Has finished PT/OT. Had recent "stomach virus" infection and gave her setback on neuro improvement. She also has left flank pain, no  shingles, following with pain management, on lyrica, concerning for lumbar radiculopathy. BP today 126/87. As per husband, pt continues to take ASA and plavix and zocor at home. Repeat CTA head and neck showed distal BA only 50% stenosis now, will continue to monitor.  07/04/2017 ;  During the interval time, patient neurologically stable.  However, patient was found to have weight gain with fluid overload.  Had TEE showed EF 35-40%, significantly down from previous 60-65%.  Low-dose  amlodipine was added.  Patient is going to follow-up with cardiology for further treatment.  BP today 158/102.  As per family, her orthostatic hypotension much improved.  Graduated home PT/OT, expecting cardio, rehab at this time. Update 01/23/2018  : She returns for follow-up in comparing by husband after last visit with Dr. Roda Shutters in January 2019. She is doing well from the stroke standpoint without recurrent stroke or TIA symptoms. 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 She was admitted in the hospital in January with bronchitis and retention of 30 pounds of fluid and saw cardiologist and has been started on heart failure medication and is doing better. She is able to walk at home using a quad cane but needs 1 person assist. She has had problems controlling her sugars and has started seeing endocrinologist Dr. Sharl Ma who plans to increase her insulin. She saw cardiologist Dr. Tresa Endo will check lipid profile which was satisfactory. She has no new complaints Update 02/05/2019 : She returns for follow-up after last visit a year ago.  She is accompanied by her husband.  Patient recently got the outpatient physical and occupational therapy ordered by Dr. Francesca Jewett which seems to have helped.  She is walking better with her walker.  She has had no falls or injuries.  She is had no recurrent symptoms of stroke or TIAs.  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  She is currently getting injections in her right eye by Dr. Bing Plume for diabetic retinopathy which seems to be helping her vision.  She remains on Plavix which is tolerating well with minor bruising and no bleeding.  Her blood pressures well controlled usually though today slightly elevated in office at 155/83.  Her fasting sugars have all been in the 120s however her  last hemoglobin A1c was elevated at 7.5.  She feels her congestive heart failure is a lot improved after Dr. Tresa Endo put her on [place].  She had a lipid profile checked a month ago and it was satisfactory. REVIEW OF SYSTEMS: Full 14 system review of systems performed and notable only for those listed below and in HPI above, all others are negative:  Weakness, stiffness, gait difficulty, bruising and all other systems negative    Allergies  Allergen Reactions   Bee Venom Anaphylaxis   Atorvastatin Other (See Comments)    Cramping  Other reaction(s): Other (See Comments) Cramping Other reaction(s): Other (See Comments) Cramping   Sulfamethoxazole-Trimethoprim Other (See Comments)    Mother died from Stevens-Johnson syndrome and sister had anaphylaxis from Septra.Physician told her never to take this medication Mother died from Stevens-Johnson syndrome and sister had anaphylaxis from Septra.Physician told her never to take this medication. Mother died from Stevens-Johnson syndrome and sister had anaphylaxis from Septra.Physician told her never to take this medication.   Codeine Itching    The neurologically relevant items on the patient's  problem list were reviewed on today's visit.  Neurologic Examination  A problem focused neurological exam (12 or more points of the single system neurologic examination, vital signs counts as 1 point, cranial nerves count for 8 points) was performed.  Blood pressure (!) 155/83, pulse 79, temperature (!) 96.2 F (35.7 C), weight 91.6 kg.  General - Well nourished, well developed, in no apparent distress.  Ophthalmologic - Fundi not visualized due to eye movement.  Cardiovascular - Regular rate and rhythm.  Mental Status -  Level of arousal and orientation to time, place and person were intact. Language exam showed mild wording finding difficulty and slow speech, mild dysarthria, but intact naming, repetition, comprehension.  Cranial  Nerves II - XII - II - Visual field intact OU. III, IV, VI - Extraocular movements intact. V - Facial sensation intact bilaterally. VII - left facial weaknessVIII - Hearing & vestibular intact bilaterally. X - Palate elevates symmetrically, mild dysarthria. XI - Chin turning & shoulder shrug intact bilaterally. XII - Tongue protrusion intact.  Motor Strength - The patients strength was 5/5 RUE and RLE, but 0/5 LUE and 4/5 LLE proximal but 2/5 DF and 3/5 PF with left  Foot drop. Bulk was normal and fasciculations were absent.    Spasticity in the left upper and lower extremity.  No clonus. Motor Tone - Muscle tone was assessed at the neck and appendages and was mildly increased LUE.  Reflexes - The patients reflexes were 1+ in all extremities and she had no pathological reflexes.  Sensory - Light touch, temperature/pinprick were assessed and were symmetrical.    Coordination - The patient had normal movements in the right hand with no ataxia or dysmetria.  Tremor was absent.  Gait and Station - in wheelchair, not tested.    Data reviewed: I personally reviewed the images and agree with the radiology interpretations.  Dg Chest 2 View 04/05/2016 No radiographic evidence of acute cardiopulmonary disease.   Ct Head Wo Contrast 04/05/2016 1. No acute intracranial findings.  2. 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   Mr Brain 40 Contrast Mr Jodene Nam Head Wo Contrast 04/05/2016 1. Foci of diffusion restriction within the right hemipons and abnormal signal in the middle cerebellar peduncles probably represent areas of acute/early subacute ischemia. No hemorrhage identified.  2. 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  3. Otherwise the circle of Willis is patent with atherosclerotic changes and short segments of mild stenosis in the anterior and posterior circulation without large vessel occlusion or aneurysm.  4. Paranasal sinus disease  predominantly in anterior ethmoid and right frontal sinuses.  5. Stable moderate chronic microvascular ischemic changes of the brain parenchyma.   EEG This is an abnormal EEG due to the presence of mild diffuse generalized slowing with greater intermixed bifrontal slowing. This pattern is nonspecific and consistent with a global encephalopathic process, nonspecific as to etiology. There is no evidence of seizure on the study.  CUS 01/17/2016 - bilateral plaques, but no hemodynamically significant stenosis  TTE - 02/12/41 - mild diastolic dysfunction with borderline left ventricular hypertrophy with a trace amount of mital regurgitation, trace amount of aortic regurgitation, trace amount of tricuspid regurgitation.  MRI brain 01/17/16 - atrophy and chronic micro-vascular ischemic changes. There is restricted diffusion on the right side of the pons consistent with an acute infarct.   MRI 05/13/16 1. Limited motion degraded brain MRI consisting of diffusion and FLAIR sequences. 2. No new abnormality compared to 04/05/2016. 3. Diffusion hyperintensity  from the recent right pontine infarct has resolved as expected, but symmetric hyperintensity persists in the middle cerebellar peduncles. Favor acute Wallerian degeneration which can have this pattern with pontine infarct. Toxic/metabolic derangement, usually described with liver disease or hypoglycemia, or hypertensive complication are alternate considerations.  CT head 08/24/16 -  Old right brainstem infarct. No acute intracranial abnormality. Chronic microvascular disease.  CTA head and neck 09/07/16 Stable vascular evaluation compared to previous studies. Right carotid bifurcation region atherosclerosis but no stenosis. 33% stenosis of the left common carotid artery proximal to the bifurcation. No left carotid bifurcation disease. 33% stenoses in both carotid siphon regions. Right vertebral artery terminates in PICA. Atherosclerotic  disease of the basilar artery with most severe stenosis of the distal basilar artery measured at 50%.  TTE 06/26/17  - Left ventricle: The cavity size was normal. There was mild   concentric hypertrophy. Systolic function was moderately reduced.   The estimated ejection fraction was in the range of 35% to 40%.   Moderate diffuse hypokinesis with no identifiable regional   variations. Features are consistent with a pseudonormal left   ventricular filling pattern, with concomitant abnormal relaxation   and increased filling pressure (grade 2 diastolic dysfunction). - Mitral valve: Mild focal calcification of the anterior leaflet,   limited to the leaflet margin. There was mild regurgitation. - Left atrium: The atrium was mildly dilated. Impressions: - Compared to 2016, there is marked worsening of left ventricular   systolic function, with a global pattern.   Component     Latest Ref Rng & Units 04/05/2016 04/07/2016  Cholesterol     0 - 200 mg/dL  168  Triglycerides     <150 mg/dL  372  HDL Cholesterol     >40 mg/dL  29 (L)  Total CHOL/HDL Ratio     RATIO  5.7  VLDL     0 - 40 mg/dL  25  LDL (calc)     0 - 99 mg/dL  902 (H)  Hemoglobin X1D     4.8 - 5.6 % 6.7 (H)   Mean Plasma Glucose     mg/dL 552   Ammonia     9 - 35 umol/L 14   Vitamin B12     180 - 914 pg/mL  320  Folate     >5.9 ng/mL  21.7    Assessment:  , she is a 63 y.o. 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   Repeat CTA head and neck 01/30/18 showed distal BA 50%, stable  from previous studies    Plan:  I had a long d/w patient and her husband about her remote stroke and spastic hemiplegia, risk for recurrent stroke/TIAs, personally independently reviewed  imaging studies and stroke evaluation results and answered questions.Continue Plavix 75 mg daily for secondary stroke prevention and maintain strict control of hypertension with blood pressure goal below 130/90, diabetes with hemoglobin A1c goal below 6.5% and lipids with LDL cholesterol goal below 70 mg/dL. I also advised the patient to eat a healthy diet with plenty of whole grains, cereals, fruits and vegetables, exercise regularly and maintain ideal body weight she was advised to use a cane and 1 person assistance at all times while walking. We  also discussed fall and safety prevention precautions. . Followup in the future with me only as needed or call earlier if necessary I spent more than 25 minutes of face to face time with the patient. Greater than 50% of time was spent in counseling and coordination of care and discussion about basilar artery stenosis and stroke risk and stroke prevention.   No orders of the defined types were placed in this encounter.   No orders of the defined types were placed in this encounter.  Delia HeadyPramod Ramzi Brathwaite, MD  [facility] [address] [place] [id number]  Phone [phone] Fax [phone]

## 2019-02-06 ENCOUNTER — Encounter: Payer: Self-pay | Admitting: Occupational Therapy

## 2019-02-06 ENCOUNTER — Ambulatory Visit: Payer: Medicare Other | Admitting: Occupational Therapy

## 2019-02-06 DIAGNOSIS — M6281 Muscle weakness (generalized): Secondary | ICD-10-CM

## 2019-02-06 DIAGNOSIS — M25632 Stiffness of left wrist, not elsewhere classified: Secondary | ICD-10-CM

## 2019-02-06 DIAGNOSIS — I69354 Hemiplegia and hemiparesis following cerebral infarction affecting left non-dominant side: Secondary | ICD-10-CM | POA: Diagnosis not present

## 2019-02-06 DIAGNOSIS — R2681 Unsteadiness on feet: Secondary | ICD-10-CM

## 2019-02-06 DIAGNOSIS — M25612 Stiffness of left shoulder, not elsewhere classified: Secondary | ICD-10-CM

## 2019-02-06 DIAGNOSIS — R29898 Other symptoms and signs involving the musculoskeletal system: Secondary | ICD-10-CM

## 2019-02-06 DIAGNOSIS — R293 Abnormal posture: Secondary | ICD-10-CM

## 2019-02-06 DIAGNOSIS — R2689 Other abnormalities of gait and mobility: Secondary | ICD-10-CM

## 2019-02-06 NOTE — Therapy (Signed)
[facility] [address] [place], [place], [address] Phone: [phone]   Fax:  [phone]  Occupational Therapy Treatment  Patient Details  Name: Shelly Mcdaniel MRN: [id number] Date of Birth: 06-03-1956 Referring Provider (OT): Dr. Alysia Penna   Encounter Date: 02/06/2019  OT End of Session - 02/06/19 1027    Visit Number  9    Number of Visits  10    Date for OT Re-Evaluation  01/23/19    Authorization Type  09/28/38-81/44/81 cert.  Medicare / BCBS    Authorization - Visit Number  9    Authorization - Number of Visits  10    OT Start Time  1022    OT Stop Time  1100    OT Time Calculation (min)  38 min    Activity Tolerance  Patient tolerated treatment well    Behavior During Therapy  WFL for tasks assessed/performed       Past Medical History:  Diagnosis Date  . Diabetes mellitus without complication ([place])   . Hyperlipidemia   . Hypertension   . Stroke ([place])   . UTI (urinary tract infection)     Past Surgical History:  Procedure Laterality Date  . BACK SURGERY    . BREAST SURGERY    . COLONOSCOPY WITH PROPOFOL N/A 12/24/2013   Procedure: COLONOSCOPY WITH PROPOFOL;  Surgeon: [name], MD;  Location: [facility];  Service: Endoscopy;  Laterality: N/A;  . RIGHT/LEFT HEART CATH AND CORONARY ANGIOGRAPHY N/A 08/10/2017   Procedure: RIGHT/LEFT HEART CATH AND CORONARY ANGIOGRAPHY;  Surgeon: Troy Sine, MD;  Location: [facility];  Service: Cardiovascular;  Laterality: N/A;    There were no vitals filed for this visit.  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  PMH:  HTN, DM, orthostatic hypotension, hx of adhesive capsulitis L shoulder, HDL, cardiomyopathy, s/p Botox  08/14/18.      Limitations  fall risk    Patient Stated Goals  to be able to move L hand and squeeze stronger    Currently in Pain?  No/denies         Transfer w/c>mat with min-mod A. (and mat>w/c with min A-mod), Supine>sitting encouraging/cueing to roll to L side with min-mod A/cues  In supine, PROM elbow ext, shoulder flex, shoulder ER, shoulder abduction, supination, finger ext, and wrist ext.  Followed by [facility] shoulder flex with BUEs with PVC frame mod facilitation.  In sitting, PROM forearm/shoulder/hand with focus on neutral positioning, followed by light wt. Bearing through L hand on mat with body on arm movements with mod facilitation, (limited by pain).  Sitting, AAROM elbow ext along lap with pool noodle with BUEs, then reaching toward floor for shoulder flex/elbow ext, followed by horizontal adduction/abduction with min facilitation and cueing.          OT Education - 02/06/19 1112    Education Details  Recommended performing shoulder horizontal add/abduction with foam noodle (add to HEP)    Person(s) Educated  Patient    Methods  Explanation;Demonstration;Tactile cues;Verbal cues    Comprehension  Verbalized understanding;Returned demonstration;Verbal cues required       OT Short Term Goals - 12/24/18 1410      OT SHORT TERM GOAL #1  Title  Pt will be independnt with updated splint wear/care for spasticity management and improved positioning.--check STGs 11/15/18    Time  4    Period  Weeks    Status  Deferred   12/13/18:  has not brought in current splint for assessment.  12/20/18:  pt reports that splint fits well, but that she hasn't been wearing last week or 2 due to being packed     OT SHORT TERM GOAL #2   Title  Pt/family to verbalize understanding with proper positioning of LUE during activity and in bed to decr risk of future complications including pain.    Time  4    Period  Weeks    Status  Achieved      OT SHORT TERM GOAL #3   Title  ----      OT  SHORT TERM GOAL #4   Title  ----      OT SHORT TERM GOAL #5   Title  ----        OT Long Term Goals - 02/06/19 1028      OT LONG TERM GOAL #1   Title  Pt/ caregiver will be independent with updated HEP--check LTGs 12/15/18    Time  8    Period  Weeks    Status  Achieved   12/24/18     OT LONG TERM GOAL #2   Title  Pt will demo improved PROM LUE to be able to position LUE appropriately on up walker in prep for short distance ambulation.    Time  8    Period  Weeks    Status  Achieved   12/13/18:  per PT/pt     OT LONG TERM GOAL #3   Title  Pt will demo at least 80* PROM L shoulder flex without pain for incr ease/safety with hygiene and UB dressing.    Time  8    Period  Weeks    Status  Achieved   12/13/18:  80-90* in supine     OT LONG TERM GOAL #4   Title  Pt will be able to use LUE as stabilizer for selected ADL/IADL tasks.    Time  8    Period  Weeks    Status  Achieved   12/13/18     OT LONG TERM GOAL #5   Title  Pt will be able to use LUE to apply deodorant under RUE mod I.--check updated goals 01/24/19    Time  5    Period  Weeks    Status  New      OT LONG TERM GOAL #6   Title  Pt will be able to use LUE to assist with bathing/washing RUE and tops of legs/stomach.    Time  5    Period  Weeks    Status  New      OT LONG TERM GOAL #7   Title  Pt will be able to hold/stabilize mixing bowl with LUE while stirring with RUE for incr independence for kitchen tasks.    Time  5    Period  Weeks    Status  Achieved   02/06/19     OT LONG TERM GOAL #8   Title  Pt will demo at least 20* active elbow extension to assist with functional reach/in order to open low drawer/cabinet.    Time  5    Period  Weeks    Status  New  Plan - 02/06/19 1028    Clinical Impression Statement  Pt is progressing towards with ability to stabilize bowl with LUE now.    OT Occupational Profile and History  Detailed Assessment- Review of Records and additional review of  physical, cognitive, psychosocial history related to current functional performance    Occupational performance deficits (Please refer to evaluation for details):  ADL's;IADL's;Leisure;Social Participation    Body Structure / Function / Physical Skills  ADL;GMC;UE functional use;Coordination;IADL;Pain;Strength;Improper spinal/pelvic alignment;Body mechanics;Decreased knowledge of use of DME;Flexibility;Mobility;ROM;Tone;Dexterity    Rehab Potential  Fair    Clinical Decision Making  Several treatment options, min-mod task modification necessary    Comorbidities Affecting Occupational Performance:  Presence of comorbidities impacting occupational performance    Comorbidities impacting occupational performance description:  hx of adhesive capsulitis L shoulder, orthostatic hypotension, cardiomyopathy    Modification or Assistance to Complete Evaluation   Min-Moderate modification of tasks or assist with assess necessary to complete eval    OT Frequency  1x / week    OT Duration  --   5 weeks   OT Treatment/Interventions  Self-care/ADL training;Therapeutic exercise;Patient/family education;Splinting;Neuromuscular education;Moist Heat;Fluidtherapy;Functional Mobility Training;Energy conservation;Therapeutic activities;Cryotherapy;Ultrasound;Contrast Bath;DME and/or AE instruction;Manual Therapy;Passive range of motion;Electrical Stimulation;Paraffin    Plan  continue with functional use of LUE, ROM, check remaining goals    OT Home Exercise Plan  Education provided:  initial HEP issued 10/16/18    Recommended Other Services  current with PT     Consulted and Agree with Plan of Care  Patient;Family member/caregiver    Family Member Consulted  husband       Patient will benefit from skilled therapeutic intervention in order to improve the following deficits and impairments:   Body Structure / Function / Physical Skills: ADL, GMC, UE functional use, Coordination, IADL, Pain, Strength, Improper  spinal/pelvic alignment, Body mechanics, Decreased knowledge of use of DME, Flexibility, Mobility, ROM, Tone, Dexterity       Visit Diagnosis: Hemiplegia and hemiparesis following cerebral infarction affecting left non-dominant side (HCC)  Other symptoms and signs involving the musculoskeletal system  Unsteadiness on feet  Other abnormalities of gait and mobility  Muscle weakness (generalized)  Stiffness of left shoulder, not elsewhere classified  Abnormal posture  Stiffness of left wrist, not elsewhere classified    Problem List Patient Active Problem List   Diagnosis Date Noted  . Cardiomyopathy (HCC)   . Coronary artery disease involving native coronary artery of native heart without angina pectoris   . Ischemic cardiomyopathy 07/04/2017  . 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 Orthostatic hypotension 04/08/2016  . Muscle cramps 04/08/2016  . 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 Cerebral thrombosis with cerebral infarction 04/06/2016  . Basilar artery stenosis   . Dysarthria 04/05/2016  . History of CVA with residual deficit 04/05/2016  . Sinus tachycardia 04/05/2016  . Acute encephalopathy 04/05/2016  . Chronic pain 04/05/2016  . Other hyperlipidemia 04/05/2016  . Ischemic stroke (HCC)   . 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 TIA (transient ischemic attack) 11/07/2014  . Uncontrolled hypertension 11/07/2014  . Controlled type 2 diabetes mellitus with diabetic nephropathy (HCC) 11/07/2014    [facility], 02/06/2019, 12:35 PM  [facility] [address] [place], [place], [address] Phone: [phone]   Fax:  [phone]  Name: Shelly Mcdaniel MRN: [id number] Date of Birth: [birth year]   Willa FraterAngela , OTR/L [facility] [address]  [place], [place]  [address] [phone]  phone [phone] 02/06/19 12:35 PM

## 2019-02-13 ENCOUNTER — Ambulatory Visit: Payer: Medicare Other | Admitting: Occupational Therapy

## 2019-02-14 ENCOUNTER — Encounter: Payer: Self-pay | Admitting: Physical Medicine & Rehabilitation

## 2019-02-14 ENCOUNTER — Other Ambulatory Visit: Payer: Self-pay

## 2019-02-14 ENCOUNTER — Ambulatory Visit: Payer: Medicare Other | Attending: Geriatric Medicine | Admitting: Physical Therapy

## 2019-02-14 ENCOUNTER — Encounter: Payer: Medicare Other | Attending: Physical Medicine & Rehabilitation | Admitting: Physical Medicine & Rehabilitation

## 2019-02-14 VITALS — BP 153/85 | HR 81 | Temp 97.9°F

## 2019-02-14 DIAGNOSIS — M6281 Muscle weakness (generalized): Secondary | ICD-10-CM | POA: Diagnosis present

## 2019-02-14 DIAGNOSIS — G8114 Spastic hemiplegia affecting left nondominant side: Secondary | ICD-10-CM

## 2019-02-14 DIAGNOSIS — R2681 Unsteadiness on feet: Secondary | ICD-10-CM | POA: Insufficient documentation

## 2019-02-14 DIAGNOSIS — G8110 Spastic hemiplegia affecting unspecified side: Secondary | ICD-10-CM | POA: Insufficient documentation

## 2019-02-14 NOTE — Patient Instructions (Signed)

## 2019-02-14 NOTE — Progress Notes (Signed)
Botox Injection for spasticity using needle EMG guidance  Dilution: 50 Units/ml Indication: Severe spasticity which interferes with ADL,mobility and/or  hygiene and is unresponsive to medication management and other conservative care Informed consent was obtained after describing risks and benefits of the procedure with the patient. This includes bleeding, bruising, infection, excessive weakness, or medication side effects. A REMS form is on file and signed. Needle: 25g 2" needle electrode Number of units per muscle  Biceps 150  Brachioradialis 50  FDS 50 FDP 50 FCR 50 PT 50U All injections were done after obtaining appropriate EMG activity and after negative drawback for blood. The patient tolerated the procedure well. Post procedure instructions were given. A followup appointment was made.

## 2019-02-14 NOTE — Therapy (Signed)
[facility] [address] [place], [place], [address] Phone: [phone]   Fax:  [phone]  Physical Therapy Treatment  Patient Details  Name: Shelly Mcdaniel MRN: [id number] Date of Birth: 09/12/1955 Referring Provider (PT): Kirsteins   Encounter Date: 02/14/2019  CLINIC OPERATION CHANGES: Outpatient Neuro Rehab is open at lower capacity following universal masking, social distancing, and patient screening.  The patient's COVID risk of complications score is 6.   PT End of Session - 02/14/19 1915    Visit Number  13    Number of Visits  16    Date for PT Re-Evaluation  03/20/19   per renewal 12/20/2018   Authorization Type  Medicare/BCBS    Authorization Time Period  12/20/2018-03/20/2019    PT Start Time  1710   Pt arrived late   PT Stop Time  1745    PT Time Calculation (min)  35 min    Equipment Utilized During Treatment  Gait belt    Activity Tolerance  Patient tolerated treatment well    Behavior During Therapy  WFL for tasks assessed/performed       Past Medical History:  Diagnosis Date  . Diabetes mellitus without complication ([place])   . Hyperlipidemia   . Hypertension   . Stroke ([place])   . UTI (urinary tract infection)     Past Surgical History:  Procedure Laterality Date  . BACK SURGERY    . BREAST SURGERY    . COLONOSCOPY WITH PROPOFOL N/A 12/24/2013   Procedure: COLONOSCOPY WITH PROPOFOL;  Surgeon: [name], MD;  Location: [facility];  Service: Endoscopy;  Laterality: N/A;  . RIGHT/LEFT HEART CATH AND CORONARY ANGIOGRAPHY N/A 08/10/2017   Procedure: RIGHT/LEFT HEART CATH AND CORONARY ANGIOGRAPHY;  Surgeon: Troy Sine, MD;  Location: [facility];  Service: Cardiovascular;  Laterality: N/A;    There were no vitals filed for this visit.  Subjective Assessment - 02/14/19 1709    Subjective  I actually walked in the steps to my townhouse last week.  My son was there to help.  Coming  down-he brought me out in the wheelchair.    Patient is accompained by:  Family member   Husband   Pertinent History  HTN, DM, Hyperlipidemia, CVA (01/2016, 03/2016), hx of UTI, cardiomyopathy, CHF    Limitations  Standing;Walking    Patient Stated Goals  Pt's goals for physical therapy are to improve transfers, building strength back in legs.    Currently in Pain?  Yes    Pain Score  4     Pain Location  Arm    Pain Orientation  Left    Pain Descriptors / Indicators  Aching    Pain Type  Acute pain   from injections today in LUE   Pain Frequency  Intermittent    Aggravating Factors   injections to L arm today.    Pain Relieving Factors  ice packs/freezing spray                       [facility] Adult PT Treatment/Exercise - 02/14/19 0001      Transfers   Transfers  Sit to Stand;Stand to Sit    Sit to Stand  3: Mod assist;With upper extremity assist;From chair/3-in-1   RUE holding to parallel bar   Stand to Sit  4: Min assist;With upper extremity assist;To chair/3-in-1      Knee/Hip Exercises: Stretches   Active Hamstring Stretch  Left;Right;2  reps;30 seconds    Active Hamstring Stretch Limitations  Seated in w/c, foot propped on floor      Knee/Hip Exercises: Aerobic   Stepper  Scifit level 1.5 x 8 minutes for flexibility with bil LE's only and from w/c with wedges behind w/c holding w/c in place   Discussed how this relates to machines at [employer]     Knee/Hip Exercises: Standing   Other Standing Knee Exercises  Standing glut sets x 10 reps, in parallel bars; standing x 2 minutes, 2 reps.  Lateral weightshifting in parallel bars      Knee/Hip Exercises: Seated   Long Arc Quad  AAROM;Left;1 set;10 reps    Heel Slides  AAROM;Left;2 sets;5 reps   Assist with RLE for improved positioning   Other Seated Knee/Hip Exercises  Seated glut sets x 10 reps, 3 sec;     Other Seated Knee/Hip Exercises  Sitting marching out to the side, 5 reps each side.    Marching   AROM;Strengthening;Left;1 set;10 reps    Sit to [employer]  Other (comment);with UE support   2 reps at parallel bars              PT Short Term Goals - 01/29/19 1312      PT SHORT TERM GOAL #1   Title  Pt will verbalize understanding of sequence of transfers, to assist husband with transfer efficiency and safety.  TARGET 01/18/2019    Time  4    Period  Weeks    Status  Achieved    Target Date  01/18/19      PT SHORT TERM GOAL #2   Title  Pt will perform stand pivot transfer with min guard level in order to increase independence at home.    Time  4    Period  Weeks    Status  Achieved    Target Date  01/18/19      PT SHORT TERM GOAL #3   Title  Pt will perform sit to stand with min guard for improved independence with transfers in the home.    Baseline  min assist for transfers    Time  4    Period  Weeks    Status  Not Met    Target Date  01/18/19      PT SHORT TERM GOAL #4   Title  Pt will ambulate 25-50 ft using UPWalker, with husband's min assistance, at least 3 times per week.    Baseline  Reports doing 20-25 ft at home, 5-6 times per week    Time  4    Period  Weeks    Status  Achieved    Target Date  01/18/19        PT Long Term Goals - 01/29/19 1328      PT LONG TERM GOAL #1   Title  Pt will perform progression of HEP for improved flexibility, strength, balance, with husband's assistance.  UPDATED TARGET for all LTGs, due to weeks remaining in POC9/18/2020    Time  8    Period  Weeks    Status  On-going      PT LONG TERM GOAL #2   Title  Pt will perform stand pivot transfer at supervision level for improved independence with transfers, decreased caregiver burden.    Time  8    Period  Weeks    Status  On-going      PT LONG TERM GOAL #3   Title  Pt  will perform sit to stand with supervision, for improved independence with transfers and decreased caregiver burden.    Baseline  min guard/min assist    Time  8    Period  Weeks    Status  Revised       PT LONG TERM GOAL #4   Title  Pt will ambulate x 50-75 ft w/ UPWalker at min A level with husband's assistance, at least 4-5 times per week at home as part of ongoing HEP.    Time  8    Period  Weeks    Status  Revised      PT LONG TERM GOAL #5   Title  Pt/husband will verbalize plans for ongoing community fitness program after d/c from PT to maintain gains made in PT.    Time  8    Period  Weeks    Status  New            Plan - 02/14/19 1916    Clinical Impression Statement  Pt did not bring or wear AFO to session today, reporting movers took it and it's at her new home.  Focused session today on review of HEP sitting and standing in parallel bars, standing tolerance in parallel bars without AFO (pt's L ankle did not roll) and seated SciFit strengthening (as pt plans to transition back to use of machines at [employer] after d/c from PT).  Pt feels she is improving, as she is walking with husband (with UpWalker) at home and has been able to get into townhome via steps. Continue towards LTGs for improved functional mobility.    Personal Factors and Comorbidities  Comorbidity 3+;Time since onset of injury/illness/exacerbation    Comorbidities  HTN, DM, Hyperlipidemia, CVA (01/2016, 03/2016), hx of UTI, cardiomyopathy, CHF    Examination-Activity Limitations  Stand;Locomotion Level;Transfers    Examination-Participation Restrictions  Community Activity    Stability/Clinical Decision Making  Evolving/Moderate complexity    Rehab Potential  Fair    PT Frequency  1x / week    PT Duration  8 weeks   per renewal 12/20/2018   PT Treatment/Interventions  ADLs/Self Care Home Management;Therapeutic exercise;Therapeutic activities;Functional mobility training;Gait training;DME Instruction;Balance training;Neuromuscular re-education;Patient/family education;Orthotic Fit/Training    PT Next Visit Plan  Transfer training/functional lower extremity strengthening, stair negotiation training, gait training  for houshold distances; update HEP as able, use SciFit for lower extremity flexibility/strengthening   Anticipate d/c at end of scheduled appts-AWM 02/14/2019   Consulted and Agree with Plan of Care  Patient       Patient will benefit from skilled therapeutic intervention in order to improve the following deficits and impairments:  Decreased mobility, Decreased strength, Decreased balance, Decreased endurance, Abnormal gait, Difficulty walking, Impaired tone  Visit Diagnosis: Muscle weakness (generalized)  Unsteadiness on feet     Problem List Patient Active Problem List   Diagnosis Date Noted  . Cardiomyopathy ([place])   . Coronary artery disease involving native coronary artery of native heart without angina pectoris   . Ischemic cardiomyopathy 07/04/2017  . 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 Orthostatic hypotension 04/08/2016  . Muscle cramps 04/08/2016  . Right pontine cerebrovascular accident ([place]) 04/08/2016  . Spastic hemiparesis of left nondominant side ([place]) 04/08/2016  . Cerebral thrombosis with cerebral infarction 04/06/2016  . Basilar artery stenosis   . Dysarthria 04/05/2016  . History of CVA with residual deficit 04/05/2016  . Sinus tachycardia 04/05/2016  . Acute encephalopathy 04/05/2016  . Chronic pain 04/05/2016  .  Other hyperlipidemia 04/05/2016  . Ischemic stroke ([place])   . 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 TIA (transient ischemic attack) 11/07/2014  . Uncontrolled hypertension 11/07/2014  . Controlled type 2 diabetes mellitus with diabetic nephropathy ([place]) 11/07/2014    MARRIOTT,AMY W. 02/14/2019, 7:19 PM  Frazier Butt., PT   [facility] [address] [place], [place], [address] Phone: [phone]   Fax:  [phone]  Name: Shelly Mcdaniel MRN: [id number] Date of Birth: 03/23/1956

## 2019-02-21 ENCOUNTER — Ambulatory Visit: Payer: Medicare Other | Admitting: Physical Therapy

## 2019-02-22 ENCOUNTER — Encounter: Payer: Self-pay | Admitting: Occupational Therapy

## 2019-02-22 ENCOUNTER — Ambulatory Visit: Payer: Medicare Other | Admitting: Occupational Therapy

## 2019-02-22 NOTE — Therapy (Signed)
[facility] [address] [place], [place], [address] Phone: [phone]   Fax:  [phone]  Patient Details  Name: Shelly Mcdaniel MRN: [id number] Date of Birth: June 13, 1956 Referring Provider:  No ref. provider found  Encounter Date: 02/22/2019   OCCUPATIONAL THERAPY DISCHARGE SUMMARY  Visits from Start of Care: 9  Current functional level related to goals / functional outcomes: OT Short Term Goals - 12/24/18 1410      OT SHORT TERM GOAL #1   Title  Pt will be independnt with updated splint wear/care for spasticity management and improved positioning.--check STGs 11/15/18    Time  4    Period  Weeks    Status  Deferred   12/13/18:  has not brought in current splint for assessment.  12/20/18:  pt reports that splint fits well, but that she hasn't been wearing last week or 2 due to being packed     OT SHORT TERM GOAL #2   Title  Pt/family to verbalize understanding with proper positioning of LUE during activity and in bed to decr risk of future complications including pain.    Time  4    Period  Weeks    Status  Achieved      OT SHORT TERM GOAL #3   Title  ----      OT SHORT TERM GOAL #4   Title  ----      OT SHORT TERM GOAL #5   Title  ----      OT Long Term Goals - 02/06/19 1028      OT LONG TERM GOAL #1   Title  Pt/ caregiver will be independent with updated HEP--check LTGs 12/15/18    Time  8    Period  Weeks    Status  Achieved   12/24/18     OT LONG TERM GOAL #2   Title  Pt will demo improved PROM LUE to be able to position LUE appropriately on up walker in prep for short distance ambulation.    Time  8    Period  Weeks    Status  Achieved   12/13/18:  per PT/pt     OT LONG TERM GOAL #3   Title  Pt will demo at least 80* PROM L shoulder flex without pain for incr ease/safety with hygiene and UB dressing.    Time  8    Period  Weeks    Status  Achieved   12/13/18:  80-90* in supine     OT LONG TERM  GOAL #4   Title  Pt will be able to use LUE as stabilizer for selected ADL/IADL tasks.    Time  8    Period  Weeks    Status  Achieved   12/13/18     OT LONG TERM GOAL #5   Title  Pt will be able to use LUE to apply deodorant under RUE mod I.--check updated goals 01/24/19    Time  5    Period  Weeks    Status  Not fully met     OT LONG TERM GOAL #6   Title  Pt will be able to use LUE to assist with bathing/washing RUE and tops of legs/stomach.    Time  5    Period  Weeks    Status  Not fully met     OT LONG TERM GOAL #7   Title  Pt will be able to hold/stabilize mixing bowl with  LUE while stirring with RUE for incr independence for kitchen tasks.    Time  5    Period  Weeks    Status  Achieved   02/06/19     OT LONG TERM GOAL #8   Title  Pt will demo at least 20* active elbow extension to assist with functional reach/in order to open low drawer/cabinet.    Time  5    Period  Weeks    Status  Not met/unable to reassess        Remaining deficits: L hemiplegia with decr AROM/PROM and spasticity and decr LUE functional use and coordination, decr balance/functional mobility   Education / Equipment: Pt instructed in HEP, strategies/AE for ADLs, ways to incr LUE functional use.  Pt verbalized understanding of education provided.  Plan: Patient agrees to discharge.  Patient goals were partially met. Patient is being discharged due to a change in medical status.  Received message that pt is now deceased.  ?????       [facility] 02/22/2019, 2:55 PM  [facility] [address] [place], [place], [address] Phone: [phone]   Fax:  [place], OTR/L [facility] [address] [place],   [address] [phone] phone [phone] 02/22/19 2:55 PM

## 2019-02-25 ENCOUNTER — Ambulatory Visit: Payer: Medicare Other | Admitting: Physical Therapy

## 2019-03-07 ENCOUNTER — Encounter: Payer: Self-pay | Admitting: Physical Therapy

## 2019-03-07 NOTE — Therapy (Signed)
[facility] [address] [place], [place], [address] Phone: [phone]   Fax:  [phone]  Patient Details  Name: Shelly Mcdaniel MRN: [id number] Date of Birth: [birth year] Referring Provider:  No ref. provider found  Encounter Date: 03/07/2019   PHYSICAL THERAPY DISCHARGE SUMMARY  Visits from Start of Care: 13  Current functional level related to goals / functional outcomes: PT Short Term Goals - 01/29/19 1312      PT SHORT TERM GOAL #1   Title  Pt will verbalize understanding of sequence of transfers, to assist husband with transfer efficiency and safety.  TARGET 01/18/2019    Time  4    Period  Weeks    Status  Achieved    Target Date  01/18/19      PT SHORT TERM GOAL #2   Title  Pt will perform stand pivot transfer with min guard level in order to increase independence at home.    Time  4    Period  Weeks    Status  Achieved    Target Date  01/18/19      PT SHORT TERM GOAL #3   Title  Pt will perform sit to stand with min guard for improved independence with transfers in the home.    Baseline  min assist for transfers    Time  4    Period  Weeks    Status  Not Met    Target Date  01/18/19      PT SHORT TERM GOAL #4   Title  Pt will ambulate 25-50 ft using UPWalker, with husband's min assistance, at least 3 times per week.    Baseline  Reports doing 20-25 ft at home, 5-6 times per week    Time  4    Period  Weeks    Status  Achieved    Target Date  01/18/19      PT Long Term Goals - 01/29/19 1328      PT LONG TERM GOAL #1   Title  Pt will perform progression of HEP for improved flexibility, strength, balance, with husband's assistance.  UPDATED TARGET for all LTGs, due to weeks remaining in POC9/18/2020    Time  8    Period  Weeks    Status  On-going      PT LONG TERM GOAL #2   Title  Pt will perform stand pivot transfer at supervision level for improved independence with transfers, decreased  caregiver burden.    Time  8    Period  Weeks    Status  On-going      PT LONG TERM GOAL #3   Title  Pt will perform sit to stand with supervision, for improved independence with transfers and decreased caregiver burden.    Baseline  min guard/min assist    Time  8    Period  Weeks    Status  Revised      PT LONG TERM GOAL #4   Title  Pt will ambulate x 50-75 ft w/ UPWalker at min A level with husband's assistance, at least 4-5 times per week at home as part of ongoing HEP.    Time  8    Period  Weeks    Status  Revised      PT LONG TERM GOAL #5   Title  Pt/husband will verbalize plans for ongoing community fitness program after d/c from PT to maintain gains made in PT.  Time  8    Period  Weeks    Status  New      LTGs not able to be assessed, as pt did not return after 02/14/2019 visit.   Remaining deficits: NA   Education / Equipment: Educated in [employer], options for community fitness.  Plan: Patient agrees to discharge.  Patient goals were not met. Patient is being discharged due to                                                     ?????Notified by patient's husband that pt is now deceased.  Mady Haagensen, PT 03/25/19 3:23 PM Phone: [phone] Fax: [phone]      Frazier Butt. 2019-03-25, 3:21 PM  [facility] [address] [place], [place], [address] Phone: [phone]   Fax:  [phone]

## 2019-03-14 DEATH — deceased

## 2019-04-03 ENCOUNTER — Ambulatory Visit: Payer: BC Managed Care – PPO | Admitting: Physical Medicine & Rehabilitation
